# Patient Record
Sex: Female | Born: 1950
Health system: Southern US, Community
[De-identification: ages and names within clinical notes are randomized; demographics above are authoritative.]

## PROBLEM LIST (undated history)

## (undated) DIAGNOSIS — T451X5A Adverse effect of antineoplastic and immunosuppressive drugs, initial encounter: Secondary | ICD-10-CM

## (undated) DIAGNOSIS — Z9289 Personal history of other medical treatment: Secondary | ICD-10-CM

## (undated) DIAGNOSIS — N390 Urinary tract infection, site not specified: Secondary | ICD-10-CM

## (undated) DIAGNOSIS — M5136 Other intervertebral disc degeneration, lumbar region: Secondary | ICD-10-CM

## (undated) DIAGNOSIS — K219 Gastro-esophageal reflux disease without esophagitis: Secondary | ICD-10-CM

## (undated) DIAGNOSIS — K649 Unspecified hemorrhoids: Secondary | ICD-10-CM

## (undated) DIAGNOSIS — I10 Essential (primary) hypertension: Secondary | ICD-10-CM

## (undated) DIAGNOSIS — N183 Chronic kidney disease, stage 3 unspecified: Secondary | ICD-10-CM

## (undated) DIAGNOSIS — G4730 Sleep apnea, unspecified: Secondary | ICD-10-CM

## (undated) DIAGNOSIS — E875 Hyperkalemia: Secondary | ICD-10-CM

## (undated) DIAGNOSIS — N200 Calculus of kidney: Secondary | ICD-10-CM

## (undated) DIAGNOSIS — S92909A Unspecified fracture of unspecified foot, initial encounter for closed fracture: Secondary | ICD-10-CM

## (undated) DIAGNOSIS — Z872 Personal history of diseases of the skin and subcutaneous tissue: Secondary | ICD-10-CM

## (undated) DIAGNOSIS — M51369 Other intervertebral disc degeneration, lumbar region without mention of lumbar back pain or lower extremity pain: Secondary | ICD-10-CM

## (undated) DIAGNOSIS — N762 Acute vulvitis: Secondary | ICD-10-CM

## (undated) DIAGNOSIS — G4733 Obstructive sleep apnea (adult) (pediatric): Secondary | ICD-10-CM

## (undated) DIAGNOSIS — D509 Iron deficiency anemia, unspecified: Secondary | ICD-10-CM

## (undated) DIAGNOSIS — M1990 Unspecified osteoarthritis, unspecified site: Secondary | ICD-10-CM

## (undated) DIAGNOSIS — J4540 Moderate persistent asthma, uncomplicated: Secondary | ICD-10-CM

## (undated) DIAGNOSIS — Z9889 Other specified postprocedural states: Secondary | ICD-10-CM

## (undated) DIAGNOSIS — J45991 Cough variant asthma: Secondary | ICD-10-CM

## (undated) DIAGNOSIS — E669 Obesity, unspecified: Secondary | ICD-10-CM

## (undated) DIAGNOSIS — N2889 Other specified disorders of kidney and ureter: Secondary | ICD-10-CM

## (undated) DIAGNOSIS — T7840XA Allergy, unspecified, initial encounter: Secondary | ICD-10-CM

## (undated) DIAGNOSIS — I509 Heart failure, unspecified: Secondary | ICD-10-CM

## (undated) DIAGNOSIS — J4550 Severe persistent asthma, uncomplicated: Secondary | ICD-10-CM

## (undated) DIAGNOSIS — J309 Allergic rhinitis, unspecified: Secondary | ICD-10-CM

## (undated) DIAGNOSIS — N8502 Endometrial intraepithelial neoplasia [EIN]: Secondary | ICD-10-CM

## (undated) DIAGNOSIS — B029 Zoster without complications: Secondary | ICD-10-CM

## (undated) DIAGNOSIS — D61810 Antineoplastic chemotherapy induced pancytopenia: Secondary | ICD-10-CM

## (undated) DIAGNOSIS — E039 Hypothyroidism, unspecified: Secondary | ICD-10-CM

## (undated) DIAGNOSIS — C541 Malignant neoplasm of endometrium: Secondary | ICD-10-CM

## (undated) DIAGNOSIS — Z9989 Dependence on other enabling machines and devices: Secondary | ICD-10-CM

## (undated) DIAGNOSIS — M5432 Sciatica, left side: Secondary | ICD-10-CM

## (undated) DIAGNOSIS — D649 Anemia, unspecified: Secondary | ICD-10-CM

## (undated) DIAGNOSIS — Z30430 Encounter for insertion of intrauterine contraceptive device: Secondary | ICD-10-CM

## (undated) DIAGNOSIS — E118 Type 2 diabetes mellitus with unspecified complications: Secondary | ICD-10-CM

## (undated) DIAGNOSIS — E785 Hyperlipidemia, unspecified: Secondary | ICD-10-CM

## (undated) DIAGNOSIS — G620 Drug-induced polyneuropathy: Secondary | ICD-10-CM

## (undated) DIAGNOSIS — R011 Cardiac murmur, unspecified: Secondary | ICD-10-CM

## (undated) DIAGNOSIS — N763 Subacute and chronic vulvitis: Secondary | ICD-10-CM

## (undated) DIAGNOSIS — Z86010 Personal history of colonic polyps: Secondary | ICD-10-CM

## (undated) DIAGNOSIS — Z8489 Family history of other specified conditions: Secondary | ICD-10-CM

## (undated) HISTORY — DX: Moderate persistent asthma, uncomplicated: J45.40

## (undated) HISTORY — DX: Encounter for insertion of intrauterine contraceptive device: Z30.430

## (undated) HISTORY — DX: Severe persistent asthma, uncomplicated: J45.50

## (undated) HISTORY — PX: FRACTURE SURGERY: SHX138

## (undated) HISTORY — DX: Iron deficiency anemia, unspecified: D50.9

## (undated) HISTORY — DX: Unspecified osteoarthritis, unspecified site: M19.90

## (undated) HISTORY — DX: Hyperkalemia: E87.5

## (undated) HISTORY — PX: CARPAL TUNNEL RELEASE: SHX101

## (undated) HISTORY — DX: Hypothyroidism, unspecified: E03.9

## (undated) HISTORY — DX: Essential (primary) hypertension: I10

## (undated) HISTORY — DX: Other intervertebral disc degeneration, lumbar region without mention of lumbar back pain or lower extremity pain: M51.369

## (undated) HISTORY — DX: Subacute and chronic vulvitis: N76.3

## (undated) HISTORY — PX: OTHER SURGICAL HISTORY: SHX169

## (undated) HISTORY — DX: Anemia, unspecified: D64.9

## (undated) HISTORY — DX: Sciatica, left side: M54.32

## (undated) HISTORY — DX: Personal history of diseases of the skin and subcutaneous tissue: Z87.2

## (undated) HISTORY — DX: Endometrial intraepithelial neoplasia (EIN): N85.02

## (undated) HISTORY — DX: Dependence on other enabling machines and devices: Z99.89

## (undated) HISTORY — DX: Unspecified hemorrhoids: K64.9

## (undated) HISTORY — PX: COLONOSCOPY: SHX174

## (undated) HISTORY — DX: Calculus of kidney: N20.0

## (undated) HISTORY — DX: Antineoplastic chemotherapy induced pancytopenia: D61.810

## (undated) HISTORY — DX: Other specified disorders of kidney and ureter: N28.89

## (undated) HISTORY — PX: ABDOMINAL HYSTERECTOMY: SHX81

## (undated) HISTORY — DX: Allergic rhinitis, unspecified: J30.9

## (undated) HISTORY — DX: Heart failure, unspecified: I50.9

## (undated) HISTORY — DX: Allergy, unspecified, initial encounter: T78.40XA

## (undated) HISTORY — PX: EYE SURGERY: SHX253

## (undated) HISTORY — DX: Adverse effect of antineoplastic and immunosuppressive drugs, initial encounter: T45.1X5A

## (undated) HISTORY — DX: Hyperlipidemia, unspecified: E78.5

## (undated) HISTORY — DX: Urinary tract infection, site not specified: N39.0

## (undated) HISTORY — DX: Gastro-esophageal reflux disease without esophagitis: K21.9

## (undated) HISTORY — PX: JOINT REPLACEMENT: SHX530

## (undated) HISTORY — DX: Personal history of other medical treatment: Z92.89

## (undated) HISTORY — DX: Cardiac murmur, unspecified: R01.1

## (undated) HISTORY — DX: Sleep apnea, unspecified: G47.30

## (undated) HISTORY — DX: Type 2 diabetes mellitus with unspecified complications: E11.8

## (undated) HISTORY — DX: Obesity, unspecified: E66.9

## (undated) HISTORY — DX: Obstructive sleep apnea (adult) (pediatric): G47.33

## (undated) HISTORY — DX: Zoster without complications: B02.9

## (undated) HISTORY — PX: ESOPHAGOGASTRODUODENOSCOPY: SHX1529

## (undated) HISTORY — DX: Drug-induced polyneuropathy: G62.0

## (undated) HISTORY — DX: Other intervertebral disc degeneration, lumbar region: M51.36

## (undated) HISTORY — DX: Personal history of colonic polyps: Z86.010

## (undated) HISTORY — DX: Chronic kidney disease, stage 3 unspecified: N18.30

## (undated) HISTORY — DX: Cough variant asthma: J45.991

## (undated) HISTORY — DX: Acute vulvitis: N76.2

---

## 1982-04-11 HISTORY — PX: ANKLE FRACTURE SURGERY: SHX122

## 1983-04-12 HISTORY — PX: TUBAL LIGATION: SHX77

## 1986-04-11 HISTORY — PX: CHOLECYSTECTOMY OPEN: SUR202

## 1998-04-11 HISTORY — PX: ARTHROSCOPIC REPAIR ACL: SUR80

## 2005-01-11 ENCOUNTER — Encounter: Admission: RE | Admit: 2005-01-11 | Discharge: 2005-01-11 | Payer: Self-pay | Admitting: Family Medicine

## 2005-07-10 DIAGNOSIS — N200 Calculus of kidney: Secondary | ICD-10-CM

## 2005-07-10 HISTORY — DX: Calculus of kidney: N20.0

## 2005-07-20 ENCOUNTER — Other Ambulatory Visit: Admission: RE | Admit: 2005-07-20 | Discharge: 2005-07-20 | Payer: Self-pay | Admitting: Obstetrics & Gynecology

## 2005-08-09 HISTORY — PX: CARDIAC CATHETERIZATION: SHX172

## 2005-08-25 ENCOUNTER — Ambulatory Visit (HOSPITAL_COMMUNITY): Admission: RE | Admit: 2005-08-25 | Discharge: 2005-08-25 | Payer: Self-pay | Admitting: *Deleted

## 2005-10-13 ENCOUNTER — Encounter: Admission: RE | Admit: 2005-10-13 | Discharge: 2005-10-13 | Payer: Self-pay | Admitting: Obstetrics & Gynecology

## 2006-07-12 ENCOUNTER — Encounter: Admission: RE | Admit: 2006-07-12 | Discharge: 2006-07-12 | Payer: Self-pay | Admitting: Orthopedic Surgery

## 2006-08-08 ENCOUNTER — Encounter: Admission: RE | Admit: 2006-08-08 | Discharge: 2006-08-08 | Payer: Self-pay | Admitting: Family Medicine

## 2006-08-10 HISTORY — PX: ARTHROSCOPIC REPAIR ACL: SUR80

## 2006-08-30 ENCOUNTER — Ambulatory Visit (HOSPITAL_BASED_OUTPATIENT_CLINIC_OR_DEPARTMENT_OTHER): Admission: RE | Admit: 2006-08-30 | Discharge: 2006-08-30 | Payer: Self-pay | Admitting: Orthopedic Surgery

## 2007-08-10 HISTORY — PX: OTHER SURGICAL HISTORY: SHX169

## 2007-08-15 ENCOUNTER — Other Ambulatory Visit: Admission: RE | Admit: 2007-08-15 | Discharge: 2007-08-15 | Payer: Self-pay | Admitting: Obstetrics & Gynecology

## 2007-08-30 ENCOUNTER — Encounter: Admission: RE | Admit: 2007-08-30 | Discharge: 2007-08-30 | Payer: Self-pay | Admitting: Obstetrics & Gynecology

## 2008-01-02 ENCOUNTER — Inpatient Hospital Stay (HOSPITAL_COMMUNITY): Admission: RE | Admit: 2008-01-02 | Discharge: 2008-01-05 | Payer: Self-pay | Admitting: Orthopedic Surgery

## 2008-02-23 ENCOUNTER — Emergency Department (HOSPITAL_BASED_OUTPATIENT_CLINIC_OR_DEPARTMENT_OTHER): Admission: EM | Admit: 2008-02-23 | Discharge: 2008-02-23 | Payer: Self-pay | Admitting: Emergency Medicine

## 2008-12-09 ENCOUNTER — Ambulatory Visit (HOSPITAL_COMMUNITY): Admission: RE | Admit: 2008-12-09 | Discharge: 2008-12-09 | Payer: Self-pay | Admitting: Orthopedic Surgery

## 2009-03-11 ENCOUNTER — Ambulatory Visit: Payer: Self-pay | Admitting: Vascular Surgery

## 2009-03-11 ENCOUNTER — Encounter (INDEPENDENT_AMBULATORY_CARE_PROVIDER_SITE_OTHER): Payer: Self-pay | Admitting: Orthopedic Surgery

## 2009-03-11 ENCOUNTER — Ambulatory Visit (HOSPITAL_COMMUNITY): Admission: RE | Admit: 2009-03-11 | Discharge: 2009-03-11 | Payer: Self-pay | Admitting: Orthopedic Surgery

## 2009-05-04 ENCOUNTER — Encounter (INDEPENDENT_AMBULATORY_CARE_PROVIDER_SITE_OTHER): Payer: Self-pay | Admitting: Orthopedic Surgery

## 2009-05-04 ENCOUNTER — Inpatient Hospital Stay (HOSPITAL_COMMUNITY): Admission: RE | Admit: 2009-05-04 | Discharge: 2009-05-07 | Payer: Self-pay | Admitting: Orthopedic Surgery

## 2009-06-11 ENCOUNTER — Encounter: Admission: RE | Admit: 2009-06-11 | Discharge: 2009-08-04 | Payer: Self-pay | Admitting: Orthopedic Surgery

## 2009-11-16 ENCOUNTER — Inpatient Hospital Stay (HOSPITAL_COMMUNITY): Admission: RE | Admit: 2009-11-16 | Discharge: 2009-11-18 | Payer: Self-pay | Admitting: Orthopedic Surgery

## 2009-12-22 ENCOUNTER — Encounter
Admission: RE | Admit: 2009-12-22 | Discharge: 2010-01-21 | Payer: Self-pay | Source: Home / Self Care | Admitting: Orthopedic Surgery

## 2010-03-11 HISTORY — PX: DILATION AND CURETTAGE OF UTERUS: SHX78

## 2010-03-30 ENCOUNTER — Ambulatory Visit
Admission: RE | Admit: 2010-03-30 | Discharge: 2010-03-30 | Payer: Self-pay | Source: Home / Self Care | Attending: Obstetrics & Gynecology | Admitting: Obstetrics & Gynecology

## 2010-05-12 HISTORY — PX: COMBINED HYSTEROSCOPY DIAGNOSTIC / D&C: SUR297

## 2010-06-08 ENCOUNTER — Ambulatory Visit (HOSPITAL_BASED_OUTPATIENT_CLINIC_OR_DEPARTMENT_OTHER)
Admission: RE | Admit: 2010-06-08 | Discharge: 2010-06-08 | Disposition: A | Discharge status: Home / Self Care | Payer: BC Managed Care – PPO | Source: Ambulatory Visit | Attending: Obstetrics & Gynecology | Admitting: Obstetrics & Gynecology

## 2010-06-08 ENCOUNTER — Other Ambulatory Visit: Payer: Self-pay | Admitting: Obstetrics & Gynecology

## 2010-06-08 DIAGNOSIS — N840 Polyp of corpus uteri: Secondary | ICD-10-CM | POA: Insufficient documentation

## 2010-06-08 DIAGNOSIS — G4733 Obstructive sleep apnea (adult) (pediatric): Secondary | ICD-10-CM | POA: Insufficient documentation

## 2010-06-08 DIAGNOSIS — N8501 Benign endometrial hyperplasia: Secondary | ICD-10-CM | POA: Insufficient documentation

## 2010-06-08 DIAGNOSIS — E109 Type 1 diabetes mellitus without complications: Secondary | ICD-10-CM | POA: Insufficient documentation

## 2010-06-08 DIAGNOSIS — N200 Calculus of kidney: Secondary | ICD-10-CM | POA: Insufficient documentation

## 2010-06-08 DIAGNOSIS — N950 Postmenopausal bleeding: Secondary | ICD-10-CM | POA: Insufficient documentation

## 2010-06-08 DIAGNOSIS — K219 Gastro-esophageal reflux disease without esophagitis: Secondary | ICD-10-CM | POA: Insufficient documentation

## 2010-06-08 LAB — POCT I-STAT 4, (NA,K, GLUC, HGB,HCT)
Glucose, Bld: 172 mg/dL — ABNORMAL HIGH (ref 70–99)
Potassium: 4.2 mEq/L (ref 3.5–5.1)
Sodium: 141 mEq/L (ref 135–145)

## 2010-06-08 LAB — GLUCOSE, CAPILLARY: Glucose-Capillary: 167 mg/dL — ABNORMAL HIGH (ref 70–99)

## 2010-06-20 NOTE — Op Note (Signed)
[name], [name]                ACCOUNT NO.:  [id number]  MEDICAL RECORD NO.:  [id number]          PATIENT TYPE:  AMB  LOCATION:  [facility]                         FACILITY:  [facility]  PHYSICIAN:  M. Leda Quail, MD  DATE OF BIRTH:  May 27, 1950  DATE OF PROCEDURE: DATE OF DISCHARGE:  03/30/2010                              OPERATIVE REPORT   PREOPERATIVE DIAGNOSES: 1. This is a 60 year old G2 P2 married white female with     postmenopausal bleeding. 2. The March 11, 2010, office endometrial biopsy showed complex     hyperplasia without atypia. 3. Dilatation and curettage on March 31, 2011, showed degenerating     secretory type endometrium with a significant amount of white blood     cells consistent with pus. 4. Morbid obesity. 5. Obstructive sleep apnea. 6. Diabetes. 7. Reflux disease. 8. Nephrolithiasis. 9. History of Zoon's vulvitis.  POSTOPERATIVE DIAGNOSES: 1. This is a 61 year old G2 P2 married white female with    postmenopausal bleeding. 2. The March 11, 2010, office endometrial biopsy showed complex     hyperplasia without atypia. 3. Dilatation and curettage on March 31, 2011, showed degenerating     secretory type endometrium with a significant amount of white blood     cells consistent with pus. 4. Morbid obesity. 5. Obstructive sleep apnea. 6. Diabetes. 7. Reflux disease. 8. Nephrolithiasis. 9. History of Zoon's vulvitis.  PROCEDURE:  Hysteroscopy Dilatation and curettage.  SURGEON:  M. Leda Quail, MD  ASSISTANT:  OR staff.  ANESTHESIA:  MAC.  ANESTHESIOLOGIST:  Jenelle Mages. Fortune, M.D. who oversaw the case.  FINDINGS:  Thin endometrium to the lower part of the uterus with some fluffy endometrium up at the cornu of the uterus bilaterally.  SPECIMENS:  Endometrial curettings, sent to pathology.  ESTIMATED BLOOD LOSS:  Minimal.  FLUIDS:  900 mL of LR.  URINE OUTPUT:  The 100 mL of clear urine drained with catheterization  at beginning of the procedure.  Hysteroscopic fluid deficit 0 mL, 1.5% glycine was used.  COMPLICATIONS:  None.  INDICATIONS:  [name] is a very nice 60 year old G2 P2 married white female who has a history of postmenopausal bleeding that was initially evaluated with an endometrial biopsy in the office.  It showed complex hyperplasia without atypia.  A D and C was then performed about 3 weeks later which showed secretory endometrium but no evidence of hyperplasia and significant amount of white cells.  She had a low-grade fever that started the day after surgery, so I treated her with antibiotics like an endometritis.  This resolved very quickly, and she never had an elevated white count.  Her fever resolved within 24 hours of starting the antibiotics.  She was seen back in the office, and due to the findings on the D and C specimen, I performed a repeat ultrasound in the office which showed thin endometrium in the central and lower portion of the uterus and endometrial thickness of no greater than 2.8 mm and some thickness at the right and left horns of the uterus.  She does have an arcuate pattern.  Because of this, I recommend  that we proceed again with hysteroscopy and D and C and then any further treatment will be pending these results.  Risks and benefits have been explained to the patient.  She has consented and is here for the procedure today.  DESCRIPTION OF PROCEDURE:  The patient taken to operating room.  She is placed in supine position.  Anesthesia was administered by the anesthesia staff without difficulty.  Legs positioned in the low lithotomy position in the Seneca stirrups.  Perineum, inner thighs, and vagina were prepped and draped in normal sterile fashion.  A red rubber Foley catheter was used to drain the bladder of all urine.  The patient was placed in some slight Trendelenburg to lift her pannus a little bit off her mons pubis and then the nursing assistant  helped to lift this during the case.  A Graves speculum was placed in the vagina.  The anterior lip of the cervix was grasped with single-tooth tenaculum.  Ten cc of 1% lidocaine was used for paracervical block, this was placed at the 3 o'clock, 6 o'clock, 9 o'clock, and 12 o'clock positions on the cervix.  The uterus then sounded to about 9.5 cm.  The uterus was dilated to a 21 Pratt dilator.  The 2.9-mm diagnostic hysteroscope was obtained.  Lower portion of the uterus and the endometrium appeared fairly thin at the very fundal region bilaterally and then in the cornua there was little bit of fluffy tissue that was present.  The hysteroscope was removed, and a #1 rough curette was used to curette the cornua and the cavity until rough gritty texture was noted in all quadrants.  There was significantly less tissue and bleeding compared to the last procedure.  Once there was no tissue really being brought out during the D and C, the procedure was ended.  The tenaculum was removed from the cervix and there was small amount of bleeding from the tenaculum site which was made hemostatic using silver nitrate.  At this point, the procedure was completely ended.  Betadine prep was cleansed off the skin.  All instruments then removed from the vagina. Sponge, lap, needle, and instrument counts were correct x2.  The patient tolerated the procedure well.  She was awoken from anesthesia and taken to the recovery room in stable condition.     Lum Keas, MD     MSM/MEDQ  D:  06/08/2010  T:  06/08/2010  Job:  [id number]  Electronically Signed by Paul Half MD on 06/20/2010 02:57:57 PM

## 2010-06-21 LAB — POCT I-STAT, CHEM 8
BUN: 21 mg/dL (ref 6–23)
Calcium, Ion: 1.02 mmol/L — ABNORMAL LOW (ref 1.12–1.32)
Chloride: 108 mEq/L (ref 96–112)
Creatinine, Ser: 1.1 mg/dL (ref 0.4–1.2)
Glucose, Bld: 97 mg/dL (ref 70–99)
HCT: 37 % (ref 36.0–46.0)
Hemoglobin: 12.6 g/dL (ref 12.0–15.0)
Potassium: 3.9 mEq/L (ref 3.5–5.1)
Sodium: 142 mEq/L (ref 135–145)
TCO2: 22 mmol/L (ref 0–100)

## 2010-06-21 LAB — GLUCOSE, CAPILLARY: Glucose-Capillary: 124 mg/dL — ABNORMAL HIGH (ref 70–99)

## 2010-06-25 LAB — CROSSMATCH: ABO/RH(D): O POS

## 2010-06-25 LAB — CBC
HCT: 25.4 % — ABNORMAL LOW (ref 36.0–46.0)
HCT: 36.4 % (ref 36.0–46.0)
Hemoglobin: 12.3 g/dL (ref 12.0–15.0)
Hemoglobin: 8.2 g/dL — ABNORMAL LOW (ref 12.0–15.0)
MCH: 31.2 pg (ref 26.0–34.0)
MCH: 31.7 pg (ref 26.0–34.0)
MCHC: 32.3 g/dL (ref 30.0–36.0)
MCHC: 32.9 g/dL (ref 30.0–36.0)
MCHC: 33.8 g/dL (ref 30.0–36.0)
MCV: 93.8 fL (ref 78.0–100.0)
MCV: 94.8 fL (ref 78.0–100.0)
Platelets: 171 10*3/uL (ref 150–400)
Platelets: 175 10*3/uL (ref 150–400)
Platelets: 220 10*3/uL (ref 150–400)
RBC: 2.65 MIL/uL — ABNORMAL LOW (ref 3.87–5.11)
RBC: 3.01 MIL/uL — ABNORMAL LOW (ref 3.87–5.11)
RBC: 3.88 MIL/uL (ref 3.87–5.11)
RDW: 13.6 % (ref 11.5–15.5)
RDW: 13.8 % (ref 11.5–15.5)
WBC: 6.9 10*3/uL (ref 4.0–10.5)
WBC: 7.9 10*3/uL (ref 4.0–10.5)

## 2010-06-25 LAB — BASIC METABOLIC PANEL
CO2: 31 mEq/L (ref 19–32)
Calcium: 8.2 mg/dL — ABNORMAL LOW (ref 8.4–10.5)
Chloride: 103 mEq/L (ref 96–112)
Creatinine, Ser: 0.7 mg/dL (ref 0.4–1.2)
GFR calc Af Amer: >60 mL/min (ref >60)
GFR calc Af Amer: >60 mL/min (ref >60)
GFR calc non Af Amer: >60 mL/min (ref >60)
GFR calc non Af Amer: >60 mL/min (ref >60)
Glucose, Bld: 126 mg/dL — ABNORMAL HIGH (ref 70–99)
Potassium: 3.8 mEq/L (ref 3.5–5.1)
Sodium: 137 mEq/L (ref 135–145)

## 2010-06-25 LAB — COMPREHENSIVE METABOLIC PANEL
ALT: 25 U/L (ref 0–35)
AST: 32 U/L (ref 0–37)
Albumin: 3.7 g/dL (ref 3.5–5.2)
Alkaline Phosphatase: 76 U/L (ref 39–117)
BUN: 13 mg/dL (ref 6–23)
CO2: 28 mEq/L (ref 19–32)
Calcium: 9.2 mg/dL (ref 8.4–10.5)
Chloride: 105 mEq/L (ref 96–112)
Creatinine, Ser: 0.65 mg/dL (ref 0.4–1.2)
GFR calc Af Amer: >60 mL/min (ref >60)
GFR calc non Af Amer: >60 mL/min (ref >60)
Glucose, Bld: 154 mg/dL — ABNORMAL HIGH (ref 70–99)
Potassium: 4.3 mEq/L (ref 3.5–5.1)
Sodium: 139 mEq/L (ref 135–145)
Total Bilirubin: 0.4 mg/dL (ref 0.3–1.2)
Total Protein: 7.8 g/dL (ref 6.0–8.3)

## 2010-06-25 LAB — URINE CULTURE
Colony Count: >=100000
Culture  Setup Time: 201108050010

## 2010-06-25 LAB — POCT I-STAT GLUCOSE: Glucose, Bld: 223 mg/dL — ABNORMAL HIGH (ref 70–99)

## 2010-06-25 LAB — DIFFERENTIAL
Basophils Absolute: 0 10*3/uL (ref 0.0–0.1)
Basophils Relative: 0 % (ref 0–1)
Eosinophils Absolute: 0.3 10*3/uL (ref 0.0–0.7)
Eosinophils Relative: 4 % (ref 0–5)
Lymphocytes Relative: 18 % (ref 12–46)
Lymphs Abs: 1.4 10*3/uL (ref 0.7–4.0)
Monocytes Absolute: 0.6 10*3/uL (ref 0.1–1.0)
Monocytes Relative: 8 % (ref 3–12)
Neutro Abs: 5.6 10*3/uL (ref 1.7–7.7)
Neutrophils Relative %: 70 % (ref 43–77)

## 2010-06-25 LAB — URINALYSIS, ROUTINE W REFLEX MICROSCOPIC
Bilirubin Urine: NEGATIVE
Glucose, UA: NEGATIVE mg/dL
Ketones, ur: NEGATIVE mg/dL
Nitrite: POSITIVE — AB
Protein, ur: NEGATIVE mg/dL
Specific Gravity, Urine: 1.012 (ref 1.005–1.030)
Urobilinogen, UA: 0.2 mg/dL (ref 0.0–1.0)
pH: 5 (ref 5.0–8.0)

## 2010-06-25 LAB — GLUCOSE, CAPILLARY
Glucose-Capillary: 128 mg/dL — ABNORMAL HIGH (ref 70–99)
Glucose-Capillary: 174 mg/dL — ABNORMAL HIGH (ref 70–99)
Glucose-Capillary: 192 mg/dL — ABNORMAL HIGH (ref 70–99)
Glucose-Capillary: 207 mg/dL — ABNORMAL HIGH (ref 70–99)
Glucose-Capillary: 256 mg/dL — ABNORMAL HIGH (ref 70–99)
Glucose-Capillary: 260 mg/dL — ABNORMAL HIGH (ref 70–99)

## 2010-06-25 LAB — SURGICAL PCR SCREEN
MRSA, PCR: NEGATIVE
Staphylococcus aureus: POSITIVE — AB

## 2010-06-25 LAB — URINE MICROSCOPIC-ADD ON

## 2010-06-25 LAB — PROTIME-INR
INR: 0.94 (ref 0.00–1.49)
Prothrombin Time: 12.8 seconds (ref 11.6–15.2)

## 2010-06-25 LAB — APTT: aPTT: 26 seconds (ref 24–37)

## 2010-06-27 LAB — BASIC METABOLIC PANEL
BUN: 8 mg/dL (ref 6–23)
Chloride: 103 mEq/L (ref 96–112)
GFR calc Af Amer: >60 mL/min (ref >60)
GFR calc non Af Amer: >60 mL/min (ref >60)
GFR calc non Af Amer: >60 mL/min (ref >60)
Glucose, Bld: 206 mg/dL — ABNORMAL HIGH (ref 70–99)
Potassium: 3.7 mEq/L (ref 3.5–5.1)
Potassium: 3.8 mEq/L (ref 3.5–5.1)
Sodium: 136 mEq/L (ref 135–145)

## 2010-06-27 LAB — URINALYSIS, ROUTINE W REFLEX MICROSCOPIC
Bilirubin Urine: NEGATIVE
Ketones, ur: NEGATIVE mg/dL
Nitrite: NEGATIVE
Specific Gravity, Urine: 1.023 (ref 1.005–1.030)
Urobilinogen, UA: 0.2 mg/dL (ref 0.0–1.0)

## 2010-06-27 LAB — COMPREHENSIVE METABOLIC PANEL
Albumin: 3.6 g/dL (ref 3.5–5.2)
Alkaline Phosphatase: 69 U/L (ref 39–117)
BUN: 12 mg/dL (ref 6–23)
CO2: 27 mEq/L (ref 19–32)
Chloride: 103 mEq/L (ref 96–112)
Creatinine, Ser: 0.68 mg/dL (ref 0.4–1.2)
GFR calc non Af Amer: >60 mL/min (ref >60)
Glucose, Bld: 150 mg/dL — ABNORMAL HIGH (ref 70–99)
Potassium: 4.2 mEq/L (ref 3.5–5.1)
Total Bilirubin: 0.7 mg/dL (ref 0.3–1.2)

## 2010-06-27 LAB — CROSSMATCH
ABO/RH(D): O POS
Antibody Screen: NEGATIVE

## 2010-06-27 LAB — CBC
HCT: 26.9 % — ABNORMAL LOW (ref 36.0–46.0)
HCT: 27 % — ABNORMAL LOW (ref 36.0–46.0)
HCT: 31.2 % — ABNORMAL LOW (ref 36.0–46.0)
HCT: 35.3 % — ABNORMAL LOW (ref 36.0–46.0)
Hemoglobin: 10.9 g/dL — ABNORMAL LOW (ref 12.0–15.0)
Hemoglobin: 11.9 g/dL — ABNORMAL LOW (ref 12.0–15.0)
Hemoglobin: 9.2 g/dL — ABNORMAL LOW (ref 12.0–15.0)
MCHC: 32.7 g/dL (ref 30.0–36.0)
MCHC: 34.4 g/dL (ref 30.0–36.0)
MCV: 91.9 fL (ref 78.0–100.0)
MCV: 92.4 fL (ref 78.0–100.0)
MCV: 92.8 fL (ref 78.0–100.0)
Platelets: 185 10*3/uL (ref 150–400)
Platelets: 198 K/uL (ref 150–400)
Platelets: 212 10*3/uL (ref 150–400)
Platelets: 281 10*3/uL (ref 150–400)
RBC: 2.92 MIL/uL — ABNORMAL LOW (ref 3.87–5.11)
RBC: 3.82 MIL/uL — ABNORMAL LOW (ref 3.87–5.11)
RDW: 15 % (ref 11.5–15.5)
RDW: 15.6 % — ABNORMAL HIGH (ref 11.5–15.5)
WBC: 7.9 10*3/uL (ref 4.0–10.5)
WBC: 8.2 10*3/uL (ref 4.0–10.5)
WBC: 8.5 K/uL (ref 4.0–10.5)

## 2010-06-27 LAB — BASIC METABOLIC PANEL WITH GFR
BUN: 8 mg/dL (ref 6–23)
CO2: 31 meq/L (ref 19–32)
Calcium: 8.1 mg/dL — ABNORMAL LOW (ref 8.4–10.5)
Chloride: 102 meq/L (ref 96–112)
Creatinine, Ser: 0.72 mg/dL (ref 0.4–1.2)
GFR calc non Af Amer: >60 mL/min
Glucose, Bld: 189 mg/dL — ABNORMAL HIGH (ref 70–99)
Potassium: 3.7 meq/L (ref 3.5–5.1)
Sodium: 138 meq/L (ref 135–145)

## 2010-06-27 LAB — DIFFERENTIAL
Basophils Absolute: 0.1 10*3/uL (ref 0.0–0.1)
Basophils Relative: 2 % — ABNORMAL HIGH (ref 0–1)
Lymphocytes Relative: 18 % (ref 12–46)
Monocytes Absolute: 0.4 10*3/uL (ref 0.1–1.0)
Neutro Abs: 5.6 10*3/uL (ref 1.7–7.7)
Neutrophils Relative %: 70 % (ref 43–77)

## 2010-06-27 LAB — HEMOGLOBIN A1C
Hgb A1c MFr Bld: 8.6 % — ABNORMAL HIGH (ref 4.6–6.1)
Mean Plasma Glucose: 200 mg/dL

## 2010-06-27 LAB — PROTIME-INR
INR: 1.07 (ref 0.00–1.49)
INR: 1.07 (ref 0.00–1.49)
Prothrombin Time: 13.6 seconds (ref 11.6–15.2)
Prothrombin Time: 13.8 s (ref 11.6–15.2)

## 2010-06-27 LAB — GLUCOSE, CAPILLARY
Glucose-Capillary: 144 mg/dL — ABNORMAL HIGH (ref 70–99)
Glucose-Capillary: 150 mg/dL — ABNORMAL HIGH (ref 70–99)
Glucose-Capillary: 150 mg/dL — ABNORMAL HIGH (ref 70–99)
Glucose-Capillary: 166 mg/dL — ABNORMAL HIGH (ref 70–99)
Glucose-Capillary: 178 mg/dL — ABNORMAL HIGH (ref 70–99)
Glucose-Capillary: 190 mg/dL — ABNORMAL HIGH (ref 70–99)
Glucose-Capillary: 194 mg/dL — ABNORMAL HIGH (ref 70–99)
Glucose-Capillary: 204 mg/dL — ABNORMAL HIGH (ref 70–99)
Glucose-Capillary: 231 mg/dL — ABNORMAL HIGH (ref 70–99)

## 2010-06-27 LAB — APTT: aPTT: 27 seconds (ref 24–37)

## 2010-06-27 LAB — URINE CULTURE

## 2010-07-15 ENCOUNTER — Ambulatory Visit: Payer: BC Managed Care – PPO | Admitting: Gynecologic Oncology

## 2010-07-22 ENCOUNTER — Ambulatory Visit: Payer: BC Managed Care – PPO | Attending: Gynecologic Oncology | Admitting: Gynecologic Oncology

## 2010-07-22 DIAGNOSIS — E039 Hypothyroidism, unspecified: Secondary | ICD-10-CM | POA: Insufficient documentation

## 2010-07-22 DIAGNOSIS — Z79899 Other long term (current) drug therapy: Secondary | ICD-10-CM | POA: Insufficient documentation

## 2010-07-22 DIAGNOSIS — Z794 Long term (current) use of insulin: Secondary | ICD-10-CM | POA: Insufficient documentation

## 2010-07-22 DIAGNOSIS — N8501 Benign endometrial hyperplasia: Secondary | ICD-10-CM | POA: Insufficient documentation

## 2010-07-22 DIAGNOSIS — E119 Type 2 diabetes mellitus without complications: Secondary | ICD-10-CM | POA: Insufficient documentation

## 2010-07-22 DIAGNOSIS — G4730 Sleep apnea, unspecified: Secondary | ICD-10-CM | POA: Insufficient documentation

## 2010-07-22 DIAGNOSIS — E785 Hyperlipidemia, unspecified: Secondary | ICD-10-CM | POA: Insufficient documentation

## 2010-07-26 NOTE — Consult Note (Signed)
NAMEJARELYN, Peters                ACCOUNT NO.:  [id number]  MEDICAL RECORD NO.:  [id number]           PATIENT TYPE:  LOCATION:                                 FACILITY:  PHYSICIAN:  Laurette Schimke, MD     DATE OF BIRTH:  1951/01/21  DATE OF CONSULTATION:  07/22/2010 DATE OF DISCHARGE:                                CONSULTATION   REASON FOR CONSULT:  Consult was requested for management of endometrial hyperplasia.  HISTORY OF PRESENT ILLNESS:  This is a 60 year old gravida 2, para 2, last normal menstrual period in November 2006.  [name] reports vaginal spotting since July 2011; however, reported heavy vaginal bleeding.  It began in October 2012.  She was evaluated by Dr. Leda Quail and underwent an endometrial biopsy in December 2011 that demonstrated complex endometrial hyperplasia.  This was after beginning Aygestin use.  D and C was performed in December 2011 and was notable for degenerating secretory-type endometrium.  She was started on Megace and was noted to have an endometritis postoperatively, which is managed with doxycycline and endometrial ultrasound on April 07, 2010, noted endometrial thickening in the right and left horns of her arcuate uterus.  She was changed to Aygestin 5 mg twice daily and was still noted to have an endometrial thickening in the fundal horns.  An office biopsy demonstrated complex endometrial hyperplasia.  Repeat D and C with hysteroscopy on June 08, 2009, showed complex endometrial hyperplasia.  [name] denies any vaginal bleeding since the time of the first D and C.  PAST SURGICAL HISTORY:  Cesarean section in 1985, cholecystectomy in 1988, bilateral carpal tunnel syndromes in 1984 and 1988, left ankle surgery in 1984, left knee replacement in 2009, right knee replacement twice in 2011, bilateral tubal ligation.  PAST MEDICAL HISTORY: 1. Diabetes mellitus since 1992. 2. Hyperlipidemia. 3. Hypothyroidism, diagnosed in  1996. 4. Morbid obesity.  The patient states that she gained 100 pounds     after the death of her son. 5. She also reports sleep apnea with pO2 of 62%.  PAST GYN HISTORY:  Menarche occurred at age of 7 with irregular menses. No history of abnormal Pap test.  Recent mammogram, colonoscopy at age 41 within normal limits.  MEDICATIONS: 1. Humalog insulin twice daily. 2. Lantus 60 mg twice daily. 3. Aspirin 81 mg daily. 4. Nephro-protectant Norvasc 5 mg daily. 5. Vasotec 10 mg daily. 6. Lipitor 40 mg daily. 7. Aygestin 10 mg daily. 8. Ditropan 15 mg daily. 9. Synthroid 125 mcg daily.  SOCIAL HISTORY:  The patient is married.  She is a former Therapist, music. One of her children died at the age of 5 as a result of complications from pulmonary atresia and this associated with 100 pound weight gain.  REVIEW OF SYSTEMS:  The patient denies any cough, shortness of breath. She reports better ability to ambulate.  She reports 30 pound weight loss since September.  No vaginal bleeding since D and C.  No rectal bleeding or hematuria.  Mild lower extremity edema.  Otherwise, 10-point review of systems is noncontributory.  ALLERGIES:  PENICILLIN that  causes hives.  PHYSICAL EXAMINATION:  GENERAL:  A well-developed female in no acute distress. VITAL SIGNS:  Weight 264 pounds, height 5 feet 1 inch, blood pressure 142/72, pulse of 88, BMI of 50. CHEST:  Clear to auscultation. HEART:  Regular rate and rhythm.  Normal S1, S2. ABDOMEN:  Soft, nontender, morbidly obese, very large pannus that extends below the mons. PELVIC:  Normal external genitalia, Bartholin, urethral, and Skene. Cervix is visible.  No blood noted within the vault.  Unable to assess any uterine characteristics.  No cervical nodularity or parametrial infiltration. RECTAL:  Limited. EXTREMITIES:  1 to 2+ edema of bilateral lower extremities.  IMPRESSION:  [name] is a 60 year old with complex endometrial hyperplasia  without atypia, currently on Aygestin.  [name] informs me that her glucoses have been more erratic with fasting blood glucoses greater than 200 since use of the Aygestin.  The options I presented to the patient were that of continued use of Aygestin with endometrial sampling in 4 months.  If endometrial sampling shows persistent complex hyperplasia, I strongly recommend placement of a Mirena IUD.  If [name] endocrinologist feels that her hyperglycemia is significantly worse, consideration could be placed to placing IUD sooner rather than later.  These options were discussed with [name] and she states that she will follow up with Dr. Leda Quail.  Otherwise, if she were to stay on the current Aygestin dose, I would not recommend endometrial biopsy any sooner than 4 months after last D and C.  Thank you very much for allowing me to participate in the care of this patient.     Laurette Schimke, MD     WB/MEDQ  D:  07/22/2010  T:  07/23/2010  Job:  [id number]  cc:   M. Leda Quail, MD Fax: [phone]  Telford Nab, R.N. [address] [place], [place] [address]  Joselyn Arrow, MD Electronically Signed by Laurette Schimke MD on 07/26/2010 11:56:23 AM

## 2010-08-05 ENCOUNTER — Ambulatory Visit (HOSPITAL_COMMUNITY)
Admission: RE | Admit: 2010-08-05 | Discharge: 2010-08-05 | Disposition: A | Discharge status: Home / Self Care | Payer: BC Managed Care – PPO | Source: Ambulatory Visit | Attending: Orthopedic Surgery | Admitting: Orthopedic Surgery

## 2010-08-05 ENCOUNTER — Other Ambulatory Visit (HOSPITAL_COMMUNITY): Payer: Self-pay | Admitting: Orthopedic Surgery

## 2010-08-05 ENCOUNTER — Encounter (HOSPITAL_COMMUNITY)
Admission: RE | Admit: 2010-08-05 | Discharge: 2010-08-05 | Disposition: A | Discharge status: Home / Self Care | Payer: BC Managed Care – PPO | Source: Ambulatory Visit | Attending: Orthopedic Surgery | Admitting: Orthopedic Surgery

## 2010-08-05 DIAGNOSIS — G4730 Sleep apnea, unspecified: Secondary | ICD-10-CM | POA: Insufficient documentation

## 2010-08-05 DIAGNOSIS — Z01812 Encounter for preprocedural laboratory examination: Secondary | ICD-10-CM | POA: Insufficient documentation

## 2010-08-05 DIAGNOSIS — I10 Essential (primary) hypertension: Secondary | ICD-10-CM | POA: Insufficient documentation

## 2010-08-05 DIAGNOSIS — Z01818 Encounter for other preprocedural examination: Secondary | ICD-10-CM | POA: Insufficient documentation

## 2010-08-05 DIAGNOSIS — M25562 Pain in left knee: Secondary | ICD-10-CM

## 2010-08-05 LAB — SURGICAL PCR SCREEN
MRSA, PCR: NEGATIVE
Staphylococcus aureus: NEGATIVE

## 2010-08-05 LAB — DIFFERENTIAL
Eosinophils Relative: 2 % (ref 0–5)
Lymphocytes Relative: 21 % (ref 12–46)
Lymphs Abs: 2.1 10*3/uL (ref 0.7–4.0)
Neutro Abs: 6.8 10*3/uL (ref 1.7–7.7)

## 2010-08-05 LAB — PROTIME-INR: Prothrombin Time: 13.6 seconds (ref 11.6–15.2)

## 2010-08-05 LAB — CBC
HCT: 37.2 % (ref 36.0–46.0)
Hemoglobin: 12.3 g/dL (ref 12.0–15.0)
MCV: 93.9 fL (ref 78.0–100.0)
RDW: 13.7 % (ref 11.5–15.5)
WBC: 9.8 10*3/uL (ref 4.0–10.5)

## 2010-08-05 LAB — COMPREHENSIVE METABOLIC PANEL
ALT: 30 U/L (ref 0–35)
AST: 27 U/L (ref 0–37)
Alkaline Phosphatase: 92 U/L (ref 39–117)
CO2: 24 mEq/L (ref 19–32)
Chloride: 104 mEq/L (ref 96–112)
GFR calc Af Amer: >60 mL/min (ref >60)
GFR calc non Af Amer: >60 mL/min (ref >60)
Glucose, Bld: 207 mg/dL — ABNORMAL HIGH (ref 70–99)
Potassium: 4.5 mEq/L (ref 3.5–5.1)
Sodium: 137 mEq/L (ref 135–145)

## 2010-08-05 LAB — URINALYSIS, ROUTINE W REFLEX MICROSCOPIC
Bilirubin Urine: NEGATIVE
Glucose, UA: 500 mg/dL — AB
Hgb urine dipstick: NEGATIVE
Specific Gravity, Urine: 1.02 (ref 1.005–1.030)
Urobilinogen, UA: 0.2 mg/dL (ref 0.0–1.0)
pH: 5.5 (ref 5.0–8.0)

## 2010-08-07 ENCOUNTER — Emergency Department (HOSPITAL_COMMUNITY)
Admission: EM | Admit: 2010-08-07 | Discharge: 2010-08-07 | Disposition: A | Discharge status: Home / Self Care | Payer: BC Managed Care – PPO | Attending: Emergency Medicine | Admitting: Emergency Medicine

## 2010-08-07 ENCOUNTER — Emergency Department (HOSPITAL_COMMUNITY): Payer: BC Managed Care – PPO

## 2010-08-07 DIAGNOSIS — E119 Type 2 diabetes mellitus without complications: Secondary | ICD-10-CM | POA: Insufficient documentation

## 2010-08-07 DIAGNOSIS — I10 Essential (primary) hypertension: Secondary | ICD-10-CM | POA: Insufficient documentation

## 2010-08-07 DIAGNOSIS — Z794 Long term (current) use of insulin: Secondary | ICD-10-CM | POA: Insufficient documentation

## 2010-08-07 DIAGNOSIS — E039 Hypothyroidism, unspecified: Secondary | ICD-10-CM | POA: Insufficient documentation

## 2010-08-07 DIAGNOSIS — Z96659 Presence of unspecified artificial knee joint: Secondary | ICD-10-CM | POA: Insufficient documentation

## 2010-08-07 DIAGNOSIS — M25569 Pain in unspecified knee: Secondary | ICD-10-CM | POA: Insufficient documentation

## 2010-08-07 DIAGNOSIS — Z7982 Long term (current) use of aspirin: Secondary | ICD-10-CM | POA: Insufficient documentation

## 2010-08-07 DIAGNOSIS — M79609 Pain in unspecified limb: Secondary | ICD-10-CM | POA: Insufficient documentation

## 2010-08-07 DIAGNOSIS — Z79899 Other long term (current) drug therapy: Secondary | ICD-10-CM | POA: Insufficient documentation

## 2010-08-07 LAB — URINE CULTURE: Culture  Setup Time: 201204261211

## 2010-08-10 DIAGNOSIS — Z872 Personal history of diseases of the skin and subcutaneous tissue: Secondary | ICD-10-CM

## 2010-08-10 HISTORY — DX: Personal history of diseases of the skin and subcutaneous tissue: Z87.2

## 2010-08-10 HISTORY — PX: REPLACEMENT TOTAL KNEE: SUR1224

## 2010-08-12 LAB — TYPE AND SCREEN
Unit division: 0
Unit division: 0

## 2010-08-16 DIAGNOSIS — T84039A Mechanical loosening of unspecified internal prosthetic joint, initial encounter: Principal | ICD-10-CM | POA: Diagnosis present

## 2010-08-16 DIAGNOSIS — G4733 Obstructive sleep apnea (adult) (pediatric): Secondary | ICD-10-CM | POA: Diagnosis present

## 2010-08-16 DIAGNOSIS — Y831 Surgical operation with implant of artificial internal device as the cause of abnormal reaction of the patient, or of later complication, without mention of misadventure at the time of the procedure: Secondary | ICD-10-CM | POA: Diagnosis present

## 2010-08-16 DIAGNOSIS — Z96659 Presence of unspecified artificial knee joint: Secondary | ICD-10-CM

## 2010-08-16 DIAGNOSIS — D62 Acute posthemorrhagic anemia: Secondary | ICD-10-CM | POA: Diagnosis not present

## 2010-08-16 LAB — GLUCOSE, CAPILLARY
Glucose-Capillary: 133 mg/dL — ABNORMAL HIGH (ref 70–99)
Glucose-Capillary: 184 mg/dL — ABNORMAL HIGH (ref 70–99)

## 2010-08-17 LAB — GLUCOSE, CAPILLARY: Glucose-Capillary: 156 mg/dL — ABNORMAL HIGH (ref 70–99)

## 2010-08-17 LAB — BASIC METABOLIC PANEL
BUN: 12 mg/dL (ref 6–23)
CO2: 29 mEq/L (ref 19–32)
Chloride: 102 mEq/L (ref 96–112)
Creatinine, Ser: 0.62 mg/dL (ref 0.4–1.2)
Glucose, Bld: 169 mg/dL — ABNORMAL HIGH (ref 70–99)
Potassium: 4.1 mEq/L (ref 3.5–5.1)

## 2010-08-17 LAB — CBC
HCT: 28.6 % — ABNORMAL LOW (ref 36.0–46.0)
MCH: 31.8 pg (ref 26.0–34.0)
MCV: 95.7 fL (ref 78.0–100.0)
RBC: 2.99 MIL/uL — ABNORMAL LOW (ref 3.87–5.11)
WBC: 6.4 10*3/uL (ref 4.0–10.5)

## 2010-08-17 LAB — HEMOGLOBIN A1C: Hgb A1c MFr Bld: 8.6 % — ABNORMAL HIGH (ref <5.7)

## 2010-08-18 LAB — CBC
HCT: 30.9 % — ABNORMAL LOW (ref 36.0–46.0)
MCH: 31.2 pg (ref 26.0–34.0)
MCHC: 33 g/dL (ref 30.0–36.0)
MCV: 94.5 fL (ref 78.0–100.0)
Platelets: 230 10*3/uL (ref 150–400)
RDW: 13.4 % (ref 11.5–15.5)
WBC: 7.5 10*3/uL (ref 4.0–10.5)

## 2010-08-18 LAB — BASIC METABOLIC PANEL
BUN: 10 mg/dL (ref 6–23)
Calcium: 9 mg/dL (ref 8.4–10.5)
Creatinine, Ser: 0.7 mg/dL (ref 0.4–1.2)
GFR calc non Af Amer: >60 mL/min (ref >60)
Glucose, Bld: 184 mg/dL — ABNORMAL HIGH (ref 70–99)

## 2010-08-18 LAB — GLUCOSE, CAPILLARY
Glucose-Capillary: 186 mg/dL — ABNORMAL HIGH (ref 70–99)
Glucose-Capillary: 256 mg/dL — ABNORMAL HIGH (ref 70–99)

## 2010-08-18 NOTE — Op Note (Signed)
  [name], [name]                ACCOUNT NO.:  [id number]  MEDICAL RECORD NO.:  [id number]           PATIENT TYPE:  I  LOCATION:  5031                         FACILITY:  [facility]  PHYSICIAN:  [name], M.D. DATE OF BIRTH:  03-16-1951  DATE OF PROCEDURE:  08/16/2010 DATE OF DISCHARGE:                              OPERATIVE REPORT   SURGEON:  [name], MD  ASSISTANT:  Altamese Cabal, PA-C  ANESTHESIA:  General.  PREOPERATIVE DIAGNOSIS:  Failed left total knee arthroplasty.  POSTOPERATIVE DIAGNOSIS:  Failed left total knee arthroplasty.  PROCEDURE:  Left revision, total knee arthroplasty.  INDICATIONS FOR PROCEDURE:  The patient is 60 year old super-morbidly obese white female with loose total knee component.  I think secondary to her weight resulting in early failure.  Informed consent was obtained with an additional diagnosis of osteopetrosis.  DESCRIPTION OF PROCEDURE:  The patient was taken to the operating room, administered spinal anesthesia.  The left leg was prepped and draped in usual sterile fashion.  The extremity was exsanguinated with the Esmarch and tourniquet inflated to 375 mmHg and set for an hour.  Midline incision was made through the old incision.  New blade was used to make a medial parapatellar arthrotomy and performed synovectomy.  I evaluated the femoral component and it was in fact stable.  I then subluxed the patella laterally and then went into flexion.  I then easily removed the tibial component.  I removed all the cement and loose debris.  I then reamed to a size 11 and then used a four tibial tray with a 20-26 mm wedge to recreate the tibia.  I then trialed that and have to use a 23- mm polyethylene.  This afforded good stability and good patellar tracking.  I then cemented in the components with Palacos GR cement.  I removed the excess cement and allowed the cement to hard in extension. I let the tourniquet down at 2 hours and 6  minutes.  I obtained hemostasis and copiously irrigated.  I then left a Hemovac coming out superolaterally and deep to the arthrotomy and pain catheter coming out supermedially and superficial to the arthrotomy.  I closed the arthrotomy with figure-of-eight #1 Vicryl sutures, buried 0 Vicryl sutures, subcuticular 2-0 Vicryl stitches, and skin staples.  Dressed with Xeroform dressing, sponges, sterile Webril, and TED stocking.  COMPLICATIONS:  None.  DRAINS:  One Hemovac and pain catheter  ESTIMATED BLOOD LOSS:  300 mL.  TOURNIQUET TIME:  126 minutes.          ______________________________ Mila Homer Sherlean Foot, M.D.     SDL/MEDQ  D:  08/16/2010  T:  08/17/2010  Job:  [id number]  Electronically Signed by Georgena Spurling M.D. on 08/18/2010 07:38:56 AM

## 2010-08-24 NOTE — Op Note (Signed)
NAMESTAVROULA, [name]                ACCOUNT NO.:  [id number]   MEDICAL RECORD NO.:  [id number]          PATIENT TYPE:  AMB   LOCATION:  [facility]                          FACILITY:  [facility]   PHYSICIAN:  Dyke Brackett, M.D.    DATE OF BIRTH:  29-Nov-1950   DATE OF PROCEDURE:  08/30/2006  DATE OF DISCHARGE:                               OPERATIVE REPORT   PREOPERATIVE DIAGNOSES:  Medial and lateral meniscal tears, right knee;  tricompartmental osteoarthritis.   POSTOPERATIVE DIAGNOSIS:  Medial and lateral meniscal tears, right knee;  tricompartmental osteoarthritis.   OPERATION:  1. Partial medial and lateral meniscectomies.  2. Debridement chondroplasty (tricompartmental).   SURGEON:  Dyke Brackett, M.D.   ANESTHESIA:  MAC with a general.   INDICATIONS:  The patient is 38 with MRI-proven meniscal tearing thought  to be amenable to outpatient surgery.   DESCRIPTION OF PROCEDURE:  The patient was arthroscoped in the supine  position.  Arthroscopic portals were created medially and laterally.  She had mild-to-moderate grade 3 chondromalacia in the patellofemoral  joints.  The most significant lesion was a grade 4 lesion over probably  a 1 cm x 2.5-cm area abutting the meniscal tear that was debrided as  well.  She had a grade 3 lesion of the femur overlying this, which was,  again, probably of the 50% weightbearing of the femoral condyle, that  was debrided.  There was an anterior-horn of the lateral meniscus that  was debrided, and a significant grade 3, borderline grade 4 lesion along  the majority of the weightbearing surface of the lateral femoral  condyle.  The lateral tibial plateau was relatively spared.  Microfracture was carried out over the grade-4 lesion medially.  A  debridement chondroplasty, tricompartmental, partial medial and lateral  meniscectomies were carried out.  The knee was drained free of fluid.  The portals were closed with nylon, and infiltrated with Marcaine  and  morphine with 80 mg Depo-Medrol.  Taken to the recovery room in stable  condition.      Dyke Brackett, M.D.  Electronically Signed     WDC/MEDQ  D:  08/30/2006  T:  08/30/2006  Job:  [id number]

## 2010-08-24 NOTE — Op Note (Signed)
[name], [name]                ACCOUNT NO.:  [id number]   MEDICAL RECORD NO.:  [id number]          PATIENT TYPE:  INP   LOCATION:  5029                         FACILITY:  [facility]   PHYSICIAN:  Dyke Brackett, M.D.    DATE OF BIRTH:  09/21/1950   DATE OF PROCEDURE:  01/02/2008  DATE OF DISCHARGE:                               OPERATIVE REPORT   PREOPERATIVE DIAGNOSES:  1. Morbid obesity.  2. Severe osteoarthritis, right knee with lateral patellar tracking.   POSTOPERATIVE DIAGNOSES:  1. Morbid obesity.  2. Severe osteoarthritis, right knee with lateral patellar tracking.   OPERATION:  1. Right low contact stress knee replacement (cemented with medium      femur size 2, tibia and medium patella 3 pack, 12.5-mm mobile      bearing.  2. Takedown repair of patellar tendon.  3. Open lateral release.   SURGEON:  Dyke Brackett, MD   ASSISTANT:  Sharol Given, PA   TOURNIQUET TIME:  Approximately 1 hour 52 minutes.   DESCRIPTION OF PROCEDURE:  It should be noted this patient had an  extremely high BMI with her height being approximately 4 feet 10 inches,  weight approximately 250 pounds.  She also had altered anatomy with  distorted bowing of the tibia, etc., making the technical degree of  difficulty through this case due to those factors certainly more than  typical.  However, the case was accomplished without any complications.  The patient was approached with a straight skin incision and the medial  parapatellar approach of the knee was made.  Identification of the most  diseased varus compartment.  Initial decision was possibly to do a  longer stent tibia, but the anatomy was noted to be altered with almost  a translocation of the proximal tibia, posteriorly making probably the  insertion of any long-stem component impossible.  Therefore, this was  abandoned and attention was next directed to extramedullary guide with a  7-degree posterior slope, which was accomplished  without difficulty  about 2 mm below the most diseased medial compartment.  Despite the  varus deformity in the proximal tilt of the tibia medially, the patella  was noticed to be in valgus, but to expose that the patient relatively  speaking to a large size and the altered anatomy, it was required to do  a partial takedown of probably 50% of the patellar tendon insertion and  this was later reattached with Mitek anchors.  The tibia was cut with a  slope as mentioned followed by an anterior-posterior femoral cut.  Flexion gap measured at 12.5 mm, eventually 4-degree valgus cut was made  through the extension at 12.5 as well.  The finishing guide was placed  on the tibia despite the patient's issues related to above.  The bone  quality was reasonably good.  The final keel cuts were placed on the  prosthesis and peg holes for the femur.  Tibia was next addressed after  tibia had been cut previously proximally and keel was cut for the tibia  with a size 2, which again was not really  over-sized.  There was one  small area laterally with this, maybe, overhanging 0.5 mm, but the size  below this I thought was little undersized.  Trial was placed on the  tibia with eventually cement mantle somewhat larger than normal due to  the patient's size, but the trial was placed first followed by cutting  the patella, which again was noted to have severe amount of osteophytes  superiorly, which were removed.  The severe lateral tracking of the  patella required open lateral release as well.  This was checked after  the true release and excessive bleeding was noted from this site.  Once  the release was accomplished and the patellar trial placed with 12.5  bearing of the knee, which had about 20 degrees flexion contracture,  preoperatively was 0.  There was a good stability in all planes.  No  tendency for bearing spin-out.  Trial components were removed.  The  final components were next placed with the  exception of the bearing  cement with 1.2 g of tobramycin per batch where a total of two batches  were mixed on the back table, inserted tibia followed by femur patella.  Cement was allowed to harden with the trial bearing in place.  Trial  bearing was removed after again tracking was deemed to be excellent.  Before the final bearing was placed, tourniquet was released.  No  excessive bleeding was noted in the posterior aspect of the knee.  Prior  to release of the tourniquet, excess amounts of cement were removed from  the back of the knee as well as the edges of the prosthesis.  Again,  tracking was checked with the final component in place and again deemed  to be excellent.  Closure was effected with #1 Ethibond.  There was a  small area where the lateral release was not brought out through the  subcutaneous tissues when the patella was tracking seemed to open  relative to just the open area on the lateral side.  This was loosely  reapproximated with some Vicryl and again the knee could be brought up  in the 90 degrees flexion without any problems with patellar tracking or  bearing.  Closure was again effected with #1 Ethibond on the capsule, 0  and 2-0 Vicryl on the skin clips, Marcaine with epinephrine on the skin.  Slightly compressed with sterile dressing and knee immobilizer were  applied.  She was taken to the recovery room in stable condition.      Dyke Brackett, M.D.  Electronically Signed     WDC/MEDQ  D:  01/02/2008  T:  01/03/2008  Job:  [id number]

## 2010-08-27 NOTE — Cardiovascular Report (Signed)
NAMEBREXLEY, [name]                ACCOUNT NO.:  [id number]   MEDICAL RECORD NO.:  [id number]          PATIENT TYPE:  OIB   LOCATION:  2899                         FACILITY:  [facility]   PHYSICIAN:  Meade Maw, M.D.    DATE OF BIRTH:  08/07/1950   DATE OF PROCEDURE:  08/25/2005  DATE OF DISCHARGE:                              CARDIAC CATHETERIZATION   INDICATION FOR STUDY:  60 year old female with ongoing chest pressure,  palpitations, and abnormal Cardiolite.  The Cardiolite was suboptimal  secondary to the patient's body habitus.   PROCEDURE:  After obtaining written informed consent, the patient was  brought to the cardiac catheterization lab in a post absorptive state.  Preop sedation was achieved.  The patient was given Versed 2 mg IV, Zofran 4  mg IV, and fentanyl 50 mcg IV throughout the case for additional sedation.  The right groin was prepped and draped in the usual sterile fashion.  Local  anesthesia was achieved using 1% Xylocaine. A 6 French hemostasis sheath was  placed into the right femoral artery with much difficulty.  Multiple  attempts were obtained.  A Smart needle was utilized.  Dr. Eldridge Dace was  asked for assistance and after multiple attempts, a 6 [place] was placed into  the right femoral artery.  Selective coronary angiography was performed  using the JL4 and JR4 Judkins catheters.  Multiple views were obtained.  All  catheter exchanges were made over a guide wire.  Single plane ventriculogram  was performed in the RAO position using a 6 French pigtail curved catheter.  Selective coronary angiography was performed using JL4 and JR4 Judkins  catheter.  Multiple views were obtained.  All catheter exchanges were made  over the guide wire.  There was no identifiable disease.  The patient was  transferred to the holding area.  Hemostasis was achieved.  There was no  immediate complications.   FINDINGS:  The aortic pressure is 134/64, LV pressure was 134/11 with an  EDP  of 18.  Single plane ventriculogram revealed normal wall motion.  The  ejection fraction was 60-65%.  Fluoroscopy revealed no significant  calcification.   CORONARY ANGIOGRAPHY:  The left main coronary artery bifurcates into the  left anterior descending and circumflex vessel.  There is no significant  disease noted in the left main coronary artery.   Left anterior descending:  The left anterior descending gives rise to a  large D1, small D2, goes on to end as an apical branch.  There is no  significant disease in the left anterior descending or its branches.   Circumflex vessel:  The circumflex vessel is a small to moderate size vessel  giving rise to one bifurcating OM1.  There is no disease noted in the  circumflex or its branches.   Right coronary artery:  The right coronary artery is a large dominant artery  that gives rise to trivial RV marginal, a large PDA, and a large  trifurcating PLV branch.  There is no significant disease noted in the right  coronary artery.   The patient was noted to have bradycardia  with premature atrial contractions  during the case, minimal heart rate was 40.  She was also noted to have O2  desaturations with sedation.   IMPRESSION:  1.  Normal coronary angiography, normal single-plane ventriculogram.      Abnormal Cardiolite secondary to the      patient's body habitus.  2.  Palpitations and tachy arrhythmias.  Will review her Holter monitor for      further evaluation.  3.  Probable sleep apnea.  The patient has been scheduled for a sleep study.      Meade Maw, M.D.  Electronically Signed     HP/MEDQ  D:  08/25/2005  T:  08/25/2005  Job:  [id number]   cc:   Lavonda Jumbo, M.D.  Fax: [phone]

## 2010-08-27 NOTE — Discharge Summary (Signed)
NAMERUMAISA, SCHNETZER                ACCOUNT NO.:  [id number]   MEDICAL RECORD NO.:  [id number]          PATIENT TYPE:  INP   LOCATION:  5029                         FACILITY:  [facility]   PHYSICIAN:  Dyke Brackett, M.D.    DATE OF BIRTH:  1950/11/03   DATE OF ADMISSION:  01/02/2008  DATE OF DISCHARGE:  01/05/2008                               DISCHARGE SUMMARY   ADMITTING DIAGNOSIS:  Right knee osteoarthritis.   DISCHARGE AND FINAL DIAGNOSIS:  Right knee osteoarthritis.   SURGEON:  Dyke Brackett, MD   PHYSICIAN ASSISTANT:  Joslyn Devon. Katrinka Blazing, [place]   PROCEDURE DONE:  A right total knee replacement for right knee  osteoarthritis on January 02, 2008.   HISTORY OF PRESENT ILLNESS:  Ms. Steidle came in, was admitted to the  hospital at [facility] on January 02, 2008, came over to the  preoperative area for a right total knee replacement due to ongoing  osteoarthritis for years, had successful right knee replacement  performed by Dr. Madelon Lips with Kateri Plummer PA assistant.  She was  transferred to PACU in stable condition and then transferred up to the  floor, in 5000.   HOSPITAL COURSE:  Postoperative day #1 was January 03, 2008.  The  patient was doing well at that point besides some mild nausea, which  does seem to be from anesthesia, PCA pump.  There was a consult put in  for Internal Medicine due to the patient's diabetes and the patient was  doing well with a consult from that point from [facility].  Her  hemoglobin on postoperative day #1 was 10.1 and hematocrit was 29.4.  Vitals were within normal limits and she was afebrile.  Her right lower  extremity was neurovascularly intact.  Foley catheter was discharged  with 50% weightbearing, knee mobilizer, up with Physical Therapy.  Postoperative day #2 was on January 04, 2008, the patient was doing  well, nausea improved.  Hemoglobin was 9.6 and hematocrit was 28.3.  Drain was pulled from her wound on her right knee, had no  signs of DVT.  Negative Homans sign.  Calf was nontender.  Working on CPM as much as 0-  90.  Vitals signs are stable.  She was afebrile.  Right lower extremity  was neurovascularly intact.  Internal Medicine continued to follow her  diabetes, well controlled with insulin by Internal Medicine.  She will  continue 50% weightbearing on that right lower extremity.  Postoperative  day #3 was on January 05, 2008, hemoglobin was 9.2 and hematocrit was  26.8.  Vitals signs are stable and she was afebrile.  Blood sugars are  at 196, controlled by Internal Medicine.  Exam was benign.  No noted  abnormality in studies.  CPM 0-90 degrees.  She was cleared medically to  go home, 50% weightbearing on that right lower extremity, set up with  home health, registered nurse, CPM, warfarin, walker, 3-in-one  __________.   FINAL DIAGNOSIS:  Right knee osteoarthritis, status post right total  knee replacement.   FINAL ASSESSMENT AND PLAN:  Ms. Thackeray is a 60 year old  female who was  discharged with right total knee replacement on January 05, 2008.  Her  weightbearing status was 50% weightbearing, knee immobilizer on that  right lower extremity.  Her diet was given as a diabetic diet on  discharge.  Wound Care, to keep the dressing over the wound, may shower,  and change dressing afterwards.  She was in stable condition at  discharge.  Discharged home on Lovenox 40 mg subcu per day at 8 a.m. for  a total of another 11 days.  She will follow up in our office for staple  removal, 10-14 days status post procedure.   FINAL DISCHARGE MEDICATIONS:  1. Percocet 5/325 one-two tablets p.o. q.4-6 h. p.r.n. pain.  2. Lovenox 40 mg subcu per day at 8 a.m. for a total of 14 days      postoperatively.  3. Robaxin 500 mg one tablet p.o. q.6-8 h. p.r.n. pain.  4. Humalog 10 units with meals.  5. Lantus 35 units in the morning.  6. Lantus 75 units nightly.  7. Synthroid 0.125 mg nightly.  8. Aspirin, that was  stopped but she was taking 81 mg per day.  9. Zocor 80 mg nightly, continued.  10.Enalapril 10 mg daily, continued.  11.Amlodipine 5 mg daily, continued.  12.Nexium 20 mg daily, continued.  13.Estrace cream nightly, that was continued.  14.FiberChoice 2 over-the-counter, that was continued.  15.Clobetasol 0.05% topical ointment, which was continued.  16.Calcium 600 mg plus vitamin D was continued.  17.Metformin 500 mg twice daily was continued.      Sharol Given, PA      Dyke Brackett, M.D.  Electronically Signed    JBS/MEDQ  D:  01/09/2008  T:  01/10/2008  Job:  [id number]

## 2010-09-09 ENCOUNTER — Ambulatory Visit: Payer: BC Managed Care – PPO | Attending: Orthopedic Surgery | Admitting: Physical Therapy

## 2010-09-09 DIAGNOSIS — M25569 Pain in unspecified knee: Secondary | ICD-10-CM | POA: Insufficient documentation

## 2010-09-09 DIAGNOSIS — Z96659 Presence of unspecified artificial knee joint: Secondary | ICD-10-CM | POA: Insufficient documentation

## 2010-09-09 DIAGNOSIS — IMO0001 Reserved for inherently not codable concepts without codable children: Secondary | ICD-10-CM | POA: Insufficient documentation

## 2010-09-14 ENCOUNTER — Ambulatory Visit: Payer: BC Managed Care – PPO | Attending: Orthopedic Surgery | Admitting: Rehabilitation

## 2010-09-14 DIAGNOSIS — Z96659 Presence of unspecified artificial knee joint: Secondary | ICD-10-CM | POA: Insufficient documentation

## 2010-09-14 DIAGNOSIS — M25569 Pain in unspecified knee: Secondary | ICD-10-CM | POA: Insufficient documentation

## 2010-09-14 DIAGNOSIS — IMO0001 Reserved for inherently not codable concepts without codable children: Secondary | ICD-10-CM | POA: Insufficient documentation

## 2010-09-16 ENCOUNTER — Ambulatory Visit: Payer: BC Managed Care – PPO | Admitting: Physical Therapy

## 2010-09-21 ENCOUNTER — Ambulatory Visit: Payer: BC Managed Care – PPO | Admitting: Rehabilitation

## 2010-09-23 ENCOUNTER — Ambulatory Visit: Payer: BC Managed Care – PPO | Admitting: Rehabilitation

## 2010-09-28 ENCOUNTER — Ambulatory Visit: Payer: BC Managed Care – PPO | Admitting: Rehabilitation

## 2010-09-29 ENCOUNTER — Ambulatory Visit: Payer: BC Managed Care – PPO | Admitting: Rehabilitation

## 2010-10-05 ENCOUNTER — Ambulatory Visit: Payer: BC Managed Care – PPO | Admitting: Rehabilitation

## 2010-10-11 ENCOUNTER — Ambulatory Visit: Payer: BC Managed Care – PPO | Admitting: Rehabilitation

## 2010-10-14 ENCOUNTER — Ambulatory Visit: Payer: BC Managed Care – PPO | Attending: Orthopedic Surgery | Admitting: Rehabilitation

## 2010-10-14 DIAGNOSIS — IMO0001 Reserved for inherently not codable concepts without codable children: Secondary | ICD-10-CM | POA: Insufficient documentation

## 2010-10-14 DIAGNOSIS — Z96659 Presence of unspecified artificial knee joint: Secondary | ICD-10-CM | POA: Insufficient documentation

## 2010-10-14 DIAGNOSIS — M25569 Pain in unspecified knee: Secondary | ICD-10-CM | POA: Insufficient documentation

## 2010-10-18 ENCOUNTER — Ambulatory Visit: Payer: BC Managed Care – PPO | Admitting: Rehabilitation

## 2010-10-20 ENCOUNTER — Ambulatory Visit: Payer: BC Managed Care – PPO | Admitting: Rehabilitation

## 2010-10-25 ENCOUNTER — Ambulatory Visit: Payer: BC Managed Care – PPO | Admitting: Physical Therapy

## 2010-11-01 ENCOUNTER — Ambulatory Visit: Payer: BC Managed Care – PPO | Admitting: Rehabilitation

## 2010-11-05 NOTE — Op Note (Signed)
  NAMEPETREA, [name]                ACCOUNT NO.:  [id number]  MEDICAL RECORD NO.:  [id number]  LOCATION:  [facility]                         FACILITY:  [facility]  PHYSICIAN:  Chucky May, M.D.  DATE OF BIRTH:  [birth year]  DATE OF PROCEDURE: DATE OF DISCHARGE:                              OPERATIVE REPORT   PREOPERATIVE DIAGNOSIS:  Cataract, left eye.  POSTOPERATIVE DIAGNOSIS:  Cataract, left eye.  INDICATIONS FOR SURGERY:  The patient is a 60 year old female with painless progressive decrease in vision such that she has difficulty seeing for reading.  She previously had cataract surgery in the right eye and because of her high myopia, the subsequent anisometropia caused problems with depth perception.  OPERATION PERFORMED:  Cataract extraction with intraocular lens implant, left eye.  SURGEON:  Chucky May, M.D.  ANESTHESIA:  MAC.  PROCEDURE:  The patient was brought to the main operating room and placed in the supine position.  Anesthesia was obtained by means of topical 4% lidocaine drops with tetracaine.  She was then prepped and draped in the usual manner.  A lid speculum was inserted and the cornea was entered by a 2.4 mm keratome temporally with an additional port inferiorly with a 1-mm keratome.  Viscoelastic was instilled into the anterior chamber and anterior capsulorrhexis was performed without difficulty in a curvilinear fashion.  The nucleus was mobilized by hydrodissection followed by phacoemulsification of the nucleus and removal of residual cortical material by irrigation and aspiration.  The posterior capsule was polished and the posterior chamber lens implant model SN60WF, power 11.0, serial number [id number] was placed in the bag without difficulty.  Viscoelastic was removed by irrigation and aspiration and replaced with Balanced Salt Solution.  The wounds were hydrated with Balanced Salt Solution and checked for fluid leaks and none were noted.  The eye  was then dressed with topical prednisolone acetate and Vigamox drops as well as a Fox shield and the patient was taken to the recovery room where she received written and verbal instructions for postoperative care and was scheduled for followup in 24 hours.          ______________________________ Chucky May, M.D.     DJD/MEDQ  D:  09/15/2010  T:  09/15/2010  Job:  [id number]  Electronically Signed by Nelson Chimes M.D. on 11/05/2010 09:31:44 AM

## 2010-11-08 ENCOUNTER — Ambulatory Visit: Payer: BC Managed Care – PPO | Admitting: Physical Therapy

## 2011-01-10 LAB — URINE MICROSCOPIC-ADD ON

## 2011-01-10 LAB — URINALYSIS, ROUTINE W REFLEX MICROSCOPIC
Bilirubin Urine: NEGATIVE
Ketones, ur: NEGATIVE
Nitrite: NEGATIVE
Nitrite: NEGATIVE
Protein, ur: NEGATIVE
Specific Gravity, Urine: 1.02
Urobilinogen, UA: 0.2
Urobilinogen, UA: 0.2
pH: 5

## 2011-01-10 LAB — BASIC METABOLIC PANEL
BUN: 11
BUN: 14
BUN: 8
CO2: 27
Calcium: 8.8
Chloride: 99
Creatinine, Ser: 0.84
GFR calc Af Amer: >60
GFR calc non Af Amer: >60
GFR calc non Af Amer: >60
Glucose, Bld: 197 — ABNORMAL HIGH
Potassium: 4.1
Potassium: 4.3
Potassium: 4.5
Sodium: 135

## 2011-01-10 LAB — URINE CULTURE
Culture: NO GROWTH
Special Requests: NEGATIVE

## 2011-01-10 LAB — CBC
HCT: 26.8 — ABNORMAL LOW
HCT: 28.3 — ABNORMAL LOW
HCT: 29.4 — ABNORMAL LOW
MCHC: 33.3
MCV: 92.4
Platelets: 205
Platelets: 248
Platelets: 284
RBC: 2.91 — ABNORMAL LOW
RBC: 3.2 — ABNORMAL LOW
RDW: 13.7
RDW: 14.3
WBC: 8.2
WBC: 9.1

## 2011-01-10 LAB — COMPREHENSIVE METABOLIC PANEL
ALT: 27
Albumin: 3.6
Alkaline Phosphatase: 63
Calcium: 9.6
Glucose, Bld: 120 — ABNORMAL HIGH
Potassium: 4.5
Sodium: 139
Total Protein: 6.9

## 2011-01-10 LAB — GLUCOSE, CAPILLARY
Glucose-Capillary: 176 — ABNORMAL HIGH
Glucose-Capillary: 202 — ABNORMAL HIGH
Glucose-Capillary: 220 — ABNORMAL HIGH
Glucose-Capillary: 230 — ABNORMAL HIGH
Glucose-Capillary: 242 — ABNORMAL HIGH
Glucose-Capillary: 244 — ABNORMAL HIGH

## 2011-01-10 LAB — DIFFERENTIAL
Basophils Relative: 0
Eosinophils Absolute: 0.2
Lymphs Abs: 1.5
Monocytes Absolute: 0.6
Monocytes Relative: 8
Neutro Abs: 5.1
Neutrophils Relative %: 69

## 2011-01-10 LAB — ABO/RH: ABO/RH(D): O POS

## 2011-01-10 LAB — TYPE AND SCREEN
ABO/RH(D): O POS
Antibody Screen: NEGATIVE

## 2011-01-10 LAB — PROTIME-INR: INR: 0.9

## 2011-01-10 LAB — APTT: aPTT: 31

## 2011-04-25 ENCOUNTER — Ambulatory Visit: Payer: BC Managed Care – PPO | Attending: Pain Medicine | Admitting: Physical Therapy

## 2011-04-25 DIAGNOSIS — M25519 Pain in unspecified shoulder: Secondary | ICD-10-CM | POA: Insufficient documentation

## 2011-04-25 DIAGNOSIS — M25619 Stiffness of unspecified shoulder, not elsewhere classified: Secondary | ICD-10-CM | POA: Insufficient documentation

## 2011-04-25 DIAGNOSIS — IMO0001 Reserved for inherently not codable concepts without codable children: Secondary | ICD-10-CM | POA: Insufficient documentation

## 2011-04-28 ENCOUNTER — Ambulatory Visit: Payer: BC Managed Care – PPO | Admitting: Rehabilitation

## 2011-05-02 ENCOUNTER — Ambulatory Visit: Payer: BC Managed Care – PPO | Admitting: Rehabilitation

## 2011-05-05 ENCOUNTER — Ambulatory Visit: Payer: BC Managed Care – PPO | Admitting: Rehabilitation

## 2011-05-09 ENCOUNTER — Ambulatory Visit: Payer: BC Managed Care – PPO | Admitting: Physical Therapy

## 2011-05-12 ENCOUNTER — Ambulatory Visit: Payer: BC Managed Care – PPO | Admitting: Rehabilitation

## 2011-05-16 ENCOUNTER — Ambulatory Visit: Payer: BC Managed Care – PPO | Attending: Pain Medicine | Admitting: Physical Therapy

## 2011-05-16 DIAGNOSIS — M25619 Stiffness of unspecified shoulder, not elsewhere classified: Secondary | ICD-10-CM | POA: Insufficient documentation

## 2011-05-16 DIAGNOSIS — IMO0001 Reserved for inherently not codable concepts without codable children: Secondary | ICD-10-CM | POA: Insufficient documentation

## 2011-05-16 DIAGNOSIS — M25519 Pain in unspecified shoulder: Secondary | ICD-10-CM | POA: Insufficient documentation

## 2011-05-19 ENCOUNTER — Ambulatory Visit: Payer: BC Managed Care – PPO | Admitting: Rehabilitation

## 2011-05-23 ENCOUNTER — Ambulatory Visit: Payer: BC Managed Care – PPO | Admitting: Physical Therapy

## 2011-05-26 ENCOUNTER — Ambulatory Visit: Payer: BC Managed Care – PPO | Admitting: Rehabilitation

## 2011-05-30 ENCOUNTER — Ambulatory Visit: Payer: BC Managed Care – PPO | Admitting: Rehabilitation

## 2011-06-02 ENCOUNTER — Ambulatory Visit: Payer: BC Managed Care – PPO | Admitting: Rehabilitation

## 2011-06-06 ENCOUNTER — Ambulatory Visit: Payer: BC Managed Care – PPO | Admitting: Rehabilitation

## 2011-06-13 ENCOUNTER — Ambulatory Visit: Payer: BC Managed Care – PPO | Attending: Pain Medicine | Admitting: Physical Therapy

## 2011-06-13 DIAGNOSIS — IMO0001 Reserved for inherently not codable concepts without codable children: Secondary | ICD-10-CM | POA: Insufficient documentation

## 2011-06-13 DIAGNOSIS — M25619 Stiffness of unspecified shoulder, not elsewhere classified: Secondary | ICD-10-CM | POA: Insufficient documentation

## 2011-06-13 DIAGNOSIS — M25519 Pain in unspecified shoulder: Secondary | ICD-10-CM | POA: Insufficient documentation

## 2011-06-16 ENCOUNTER — Encounter: Payer: BC Managed Care – PPO | Admitting: Rehabilitation

## 2011-08-31 ENCOUNTER — Other Ambulatory Visit: Payer: Self-pay | Admitting: Obstetrics & Gynecology

## 2011-08-31 DIAGNOSIS — Z1231 Encounter for screening mammogram for malignant neoplasm of breast: Secondary | ICD-10-CM

## 2011-09-19 ENCOUNTER — Ambulatory Visit
Admission: RE | Admit: 2011-09-19 | Discharge: 2011-09-19 | Disposition: A | Discharge status: Home / Self Care | Payer: BC Managed Care – PPO | Source: Ambulatory Visit | Attending: Obstetrics & Gynecology | Admitting: Obstetrics & Gynecology

## 2011-09-19 DIAGNOSIS — Z1231 Encounter for screening mammogram for malignant neoplasm of breast: Secondary | ICD-10-CM

## 2011-09-23 ENCOUNTER — Other Ambulatory Visit: Payer: Self-pay | Admitting: Obstetrics & Gynecology

## 2011-09-23 DIAGNOSIS — R928 Other abnormal and inconclusive findings on diagnostic imaging of breast: Secondary | ICD-10-CM

## 2011-09-29 ENCOUNTER — Ambulatory Visit
Admission: RE | Admit: 2011-09-29 | Discharge: 2011-09-29 | Disposition: A | Discharge status: Home / Self Care | Payer: BC Managed Care – PPO | Source: Ambulatory Visit | Attending: Obstetrics & Gynecology | Admitting: Obstetrics & Gynecology

## 2011-09-29 ENCOUNTER — Other Ambulatory Visit: Payer: Self-pay | Admitting: Obstetrics & Gynecology

## 2011-09-29 DIAGNOSIS — R928 Other abnormal and inconclusive findings on diagnostic imaging of breast: Secondary | ICD-10-CM

## 2011-10-06 ENCOUNTER — Ambulatory Visit
Admission: RE | Admit: 2011-10-06 | Discharge: 2011-10-06 | Disposition: A | Discharge status: Home / Self Care | Payer: BC Managed Care – PPO | Source: Ambulatory Visit | Attending: Obstetrics & Gynecology | Admitting: Obstetrics & Gynecology

## 2011-10-06 ENCOUNTER — Other Ambulatory Visit: Payer: Self-pay | Admitting: Obstetrics & Gynecology

## 2011-10-06 DIAGNOSIS — R928 Other abnormal and inconclusive findings on diagnostic imaging of breast: Secondary | ICD-10-CM

## 2011-10-06 DIAGNOSIS — N6001 Solitary cyst of right breast: Secondary | ICD-10-CM

## 2012-04-11 HISTORY — PX: BREAST BIOPSY: SHX20

## 2012-10-02 ENCOUNTER — Encounter: Payer: Self-pay | Admitting: Obstetrics & Gynecology

## 2012-10-02 DIAGNOSIS — E669 Obesity, unspecified: Secondary | ICD-10-CM | POA: Insufficient documentation

## 2012-10-03 ENCOUNTER — Telehealth: Payer: Self-pay | Admitting: Obstetrics & Gynecology

## 2012-10-03 NOTE — Telephone Encounter (Signed)
Pt cancelled aex appt with dr Hyacinth Meeker and wanted to reschedule. Pt mentioned that  She gets a biopsy with the aex. Appt was just scheduled for aex.

## 2012-10-04 ENCOUNTER — Ambulatory Visit: Payer: Self-pay | Admitting: Obstetrics & Gynecology

## 2012-10-30 ENCOUNTER — Ambulatory Visit (INDEPENDENT_AMBULATORY_CARE_PROVIDER_SITE_OTHER): Payer: BC Managed Care – PPO | Admitting: Obstetrics & Gynecology

## 2012-10-30 ENCOUNTER — Other Ambulatory Visit: Payer: Self-pay

## 2012-10-30 ENCOUNTER — Encounter: Payer: Self-pay | Admitting: Obstetrics & Gynecology

## 2012-10-30 VITALS — BP 140/68 | HR 64 | Resp 16 | Ht 61.75 in | Wt 260.0 lb

## 2012-10-30 DIAGNOSIS — Z124 Encounter for screening for malignant neoplasm of cervix: Secondary | ICD-10-CM

## 2012-10-30 DIAGNOSIS — Z01419 Encounter for gynecological examination (general) (routine) without abnormal findings: Secondary | ICD-10-CM

## 2012-10-30 DIAGNOSIS — Z1231 Encounter for screening mammogram for malignant neoplasm of breast: Secondary | ICD-10-CM

## 2012-10-30 DIAGNOSIS — Z1211 Encounter for screening for malignant neoplasm of colon: Secondary | ICD-10-CM

## 2012-10-30 DIAGNOSIS — N8501 Benign endometrial hyperplasia: Secondary | ICD-10-CM

## 2012-10-30 MED ORDER — TERCONAZOLE 0.4 % VA CREA: 1.0000 | TOPICAL_CREAM | Freq: Every day | VAGINAL | Status: DC

## 2012-10-30 MED ORDER — CLOBETASOL PROPIONATE 0.05 % EX OINT: TOPICAL_OINTMENT | Freq: Two times a day (BID) | CUTANEOUS | Status: DC

## 2012-10-30 NOTE — Patient Instructions (Addendum)

## 2012-10-30 NOTE — Progress Notes (Signed)
62 y.o. [id number] MarriedCaucasianF here for annual exam.  No bleeding.  Doing well.  Seeing Dr. Talmage Nap.  Last visit was in May.  Being seen every three to four months.  Hb A1C was 8.0.  Reports was as high as 13 in the last year.  Goal is between 6-7.    Patient's last menstrual period was 08/10/2010.          Sexually active: no  The current method of family planning is IUD.    Exercising: yes  walking Smoker:  no  Health Maintenance: Pap:  08/30/11 WNL/negative HR HPV History of abnormal Pap:  No/complex hyperplasia atypia on endometrial biopsy MMG:  09/19/11 [facility], 09/29/11 additional views, 10/06/11 biopsy-MM[facility] scheduled 11/15/12 Colonoscopy:  Right at 10 years ago-would like referral BMD:   2012, normal TDaP:  5/08 Screening Labs: PCP, Hb today: PCP, Urine today: PCP   reports that she has never smoked. She has never used smokeless tobacco. She reports that she does not drink alcohol or use illicit drugs.  Past Medical History  Diagnosis Date  . Nephrolithiasis 4/07  . GERD (gastroesophageal reflux disease)   . Diabetes mellitus   . Obesity   . Sleep apnea     On CPAP  . Hemorrhoids   . Complex endometrial hyperplasia with atypia 12/12  . Cellulitis 5/12    Left (Since replacemnt of Left knee  . Encounter for insertion of mirena IUD 5/12    Past Surgical History  Procedure Laterality Date  . Ankle fracture surgery  1984    Pin & repair  . Carpal tunnel release Right 1984;1989    1989 Left  . Tubal ligation  1985    C-Section  . Cholecystectomy  1988  . Arthroscopic repair acl  2000    Due to ACL tear  . Cardiac catheterization  5/07    clear vessel mild mitral   . Arthroscopic repair acl  5/08  . Dilation and curettage of uterus  12/11  . Combined hysteroscopy diagnostic / d&c  2/12  . Replacement total knee Left 5/12    Current Outpatient Prescriptions  Medication Sig Dispense Refill  . aspirin 81 MG tablet Take 81 mg by mouth daily.      Marland Kitchen atorvastatin (LIPITOR) 40  MG tablet Take 40 mg by mouth daily.      . clobetasol ointment (TEMOVATE) 0.05 %       . enalapril (VASOTEC) 10 MG tablet Take 10 mg by mouth daily.      . insulin lispro (HUMALOG) 100 unit/mL SOLN Inject into the skin 3 (three) times daily.      Marland Kitchen levothyroxine (SYNTHROID, LEVOTHROID) 125 MCG tablet Take 125 mcg by mouth daily before breakfast.      . NOVOLOG MIX 70/30 FLEXPEN (70-30) 100 UNIT/ML SUPN       . TURMERIC PO Take by mouth.      . oxybutynin (DITROPAN XL) 15 MG 24 hr tablet Take 15 mg by mouth daily.       No current facility-administered medications for this visit.    Family History  Problem Relation Age of Onset  . Other Son     VSD/pulmonary atresia  . Diabetes Father   . Diabetes Brother     ROS:  Pertinent items are noted in HPI.  Otherwise, a comprehensive ROS was negative.  Exam:   BP 140/68  Pulse 64  Resp 16  Wt 260 lb (117.935 kg)  LMP 08/10/2010  Ht Readings from Last 3 Encounters:  No data found for Ht    General appearance: alert, cooperative and appears stated age Head: Normocephalic, without obvious abnormality, atraumatic Neck: no adenopathy, supple, symmetrical, trachea midline and thyroid normal to inspection and palpation Lungs: clear to auscultation bilaterally Breasts: normal appearance, no masses or tenderness Heart: regular rate and rhythm Abdomen: soft, non-tender; bowel sounds normal; no masses,  no organomegaly Extremities: extremities normal, atraumatic, no cyanosis or edema Skin: Skin color, texture, turgor normal. No rashes or lesions Lymph nodes: Cervical, supraclavicular, and axillary nodes normal. No abnormal inguinal nodes palpated Neurologic: Grossly normal   Pelvic: External genitalia:  no lesions, inside labia minora erythematous with patchy thick white areas              Urethra:  normal appearing urethra with no masses, tenderness or lesions              Bartholins and Skenes: normal                 Vagina:  normal appearing vagina with normal color and discharge, no lesions              Cervix: no lesions, IUD string noted              Pap taken: yes Bimanual Exam:  Uterus:  normal size, contour, position, consistency, mobility, non-tender, exam extremely difficult due to habitus              Adnexa: normal adnexa and no mass, fullness, tenderness               Rectovaginal: Confirms               Anus:  normal sphincter tone, no lesions  Endometrial biopsy recommended.  Discussed with patient.  Verbal and written consent obtained.   Procedure:  Speculum placed.  Cervix visualized and cleansed with betadine prep.  A single toothed tenaculum was applied to the anterior lip of the cervix.  Endometrial pipelle was advanced through the cervix into the endometrial cavity without difficulty.  Pipelle passed to 9cm.  Suction applied and pipelle removed with good tissue sample obtained.  2 passes performed.  Tenculum removed.  No bleeding noted.  Patient tolerated procedure well.  A:  Well Woman with normal exam Morbid obesity Diabetes OSA Elevated lipids GERD History of complex endometrial hyperplasia without atypia, now,with Mirena IUD H/O biopsy proven lichen sclerosus/Zoon's vulvitis   P:   Mammogram yearly pap smear obtained.  Neg HR HPV one yr ago. Endometrial biopsy today.  Mirena IUD placed 5/12.  This will be third f/u biopsy.  10/12, 5/13, today.  Pt knows to call if she ever has any future bleeding. Sees Dr. Talmage Nap every four months Rx for clobetasol to pharmacy return annually or prn  An After Visit Summary was printed and given to the patient.

## 2012-11-01 LAB — IPS PAP SMEAR ONLY

## 2012-11-15 ENCOUNTER — Ambulatory Visit
Admission: RE | Admit: 2012-11-15 | Discharge: 2012-11-15 | Disposition: A | Discharge status: Home / Self Care | Payer: BC Managed Care – PPO | Source: Ambulatory Visit

## 2012-11-15 DIAGNOSIS — Z1231 Encounter for screening mammogram for malignant neoplasm of breast: Secondary | ICD-10-CM

## 2012-11-16 ENCOUNTER — Other Ambulatory Visit: Payer: Self-pay | Admitting: Obstetrics & Gynecology

## 2012-11-16 DIAGNOSIS — R928 Other abnormal and inconclusive findings on diagnostic imaging of breast: Secondary | ICD-10-CM

## 2012-12-04 ENCOUNTER — Other Ambulatory Visit: Payer: BC Managed Care – PPO

## 2012-12-13 ENCOUNTER — Other Ambulatory Visit: Payer: Self-pay | Admitting: Gastroenterology

## 2012-12-13 DIAGNOSIS — R1310 Dysphagia, unspecified: Secondary | ICD-10-CM

## 2012-12-14 ENCOUNTER — Ambulatory Visit
Admission: RE | Admit: 2012-12-14 | Discharge: 2012-12-14 | Disposition: A | Discharge status: Home / Self Care | Payer: BC Managed Care – PPO | Source: Ambulatory Visit | Attending: Obstetrics & Gynecology | Admitting: Obstetrics & Gynecology

## 2012-12-14 DIAGNOSIS — R928 Other abnormal and inconclusive findings on diagnostic imaging of breast: Secondary | ICD-10-CM

## 2012-12-18 ENCOUNTER — Other Ambulatory Visit: Payer: BC Managed Care – PPO

## 2012-12-21 ENCOUNTER — Ambulatory Visit
Admission: RE | Admit: 2012-12-21 | Discharge: 2012-12-21 | Disposition: A | Discharge status: Home / Self Care | Payer: BC Managed Care – PPO | Source: Ambulatory Visit | Attending: Gastroenterology | Admitting: Gastroenterology

## 2012-12-21 ENCOUNTER — Other Ambulatory Visit: Payer: BC Managed Care – PPO

## 2012-12-21 DIAGNOSIS — R1310 Dysphagia, unspecified: Secondary | ICD-10-CM

## 2013-02-25 ENCOUNTER — Encounter (HOSPITAL_COMMUNITY): Payer: Self-pay | Admitting: *Deleted

## 2013-02-25 ENCOUNTER — Encounter (HOSPITAL_COMMUNITY)
Admission: RE | Disposition: A | Discharge status: Home / Self Care | Payer: Self-pay | Source: Ambulatory Visit | Attending: Gastroenterology

## 2013-02-25 ENCOUNTER — Ambulatory Visit (HOSPITAL_COMMUNITY)
Admission: RE | Admit: 2013-02-25 | Discharge: 2013-02-25 | Disposition: A | Discharge status: Home / Self Care | Payer: BC Managed Care – PPO | Source: Ambulatory Visit | Attending: Gastroenterology | Admitting: Gastroenterology

## 2013-02-25 DIAGNOSIS — Z860101 Personal history of adenomatous and serrated colon polyps: Secondary | ICD-10-CM

## 2013-02-25 DIAGNOSIS — E119 Type 2 diabetes mellitus without complications: Secondary | ICD-10-CM | POA: Insufficient documentation

## 2013-02-25 DIAGNOSIS — Z1211 Encounter for screening for malignant neoplasm of colon: Secondary | ICD-10-CM | POA: Insufficient documentation

## 2013-02-25 DIAGNOSIS — G4730 Sleep apnea, unspecified: Secondary | ICD-10-CM | POA: Insufficient documentation

## 2013-02-25 DIAGNOSIS — E039 Hypothyroidism, unspecified: Secondary | ICD-10-CM | POA: Insufficient documentation

## 2013-02-25 DIAGNOSIS — I10 Essential (primary) hypertension: Secondary | ICD-10-CM | POA: Insufficient documentation

## 2013-02-25 DIAGNOSIS — Z86010 Personal history of colonic polyps: Secondary | ICD-10-CM

## 2013-02-25 DIAGNOSIS — K219 Gastro-esophageal reflux disease without esophagitis: Secondary | ICD-10-CM | POA: Insufficient documentation

## 2013-02-25 DIAGNOSIS — D126 Benign neoplasm of colon, unspecified: Secondary | ICD-10-CM | POA: Insufficient documentation

## 2013-02-25 HISTORY — DX: Personal history of colonic polyps: Z86.010

## 2013-02-25 HISTORY — PX: COLONOSCOPY: SHX5424

## 2013-02-25 HISTORY — DX: Unspecified fracture of unspecified foot, initial encounter for closed fracture: S92.909A

## 2013-02-25 HISTORY — DX: Personal history of adenomatous and serrated colon polyps: Z86.0101

## 2013-02-25 LAB — GLUCOSE, CAPILLARY: Glucose-Capillary: 129 mg/dL — ABNORMAL HIGH (ref 70–99)

## 2013-02-25 SURGERY — COLONOSCOPY
Anesthesia: Moderate Sedation

## 2013-02-25 MED ORDER — FENTANYL CITRATE 0.05 MG/ML IJ SOLN
INTRAMUSCULAR | Status: DC | PRN
  Administered 2013-02-25 (×2): 25 ug via INTRAVENOUS

## 2013-02-25 MED ORDER — FENTANYL CITRATE 0.05 MG/ML IJ SOLN
INTRAMUSCULAR | Status: AC
  Filled 2013-02-25: qty 2

## 2013-02-25 MED ORDER — MIDAZOLAM HCL 10 MG/2ML IJ SOLN
INTRAMUSCULAR | Status: DC | PRN
  Administered 2013-02-25 (×2): 2 mg via INTRAVENOUS
  Administered 2013-02-25: 1 mg via INTRAVENOUS

## 2013-02-25 MED ORDER — MIDAZOLAM HCL 10 MG/2ML IJ SOLN
INTRAMUSCULAR | Status: AC
  Filled 2013-02-25: qty 2

## 2013-02-25 MED ORDER — SODIUM CHLORIDE 0.9 % IV SOLN
INTRAVENOUS | Status: DC
  Administered 2013-02-25: 500 mL via INTRAVENOUS

## 2013-02-25 MED ORDER — DIPHENHYDRAMINE HCL 50 MG/ML IJ SOLN
INTRAMUSCULAR | Status: AC
  Filled 2013-02-25: qty 1

## 2013-02-25 NOTE — Op Note (Signed)
[facility] [address] [place], [address]   OPERATIVE PROCEDURE REPORT  PATIENT: [name], [name]  MR#: [id number] BIRTHDATE: November 13, 1950 GENDER: Female ENDOSCOPIST: Lorenza Burton, MD ASSISTANT:   Dorisann Frames, technician & Tomma Rakers, RN, BSN PROCEDURE DATE: 02/25/2013 PRE-PROCEDURE PREPARATION: The patient was prepped with 2 dulcolax tablets, one ten-ounce bottle of magnesium citrate, and a gallon of Golytely the night prior to the procedure.  The patient was fasted for 8 hours prior to the procedure.  PRE-PROCEDURE PHYSICAL: Patient has stable vital signs.  Neck is supple.  There is no JVD, thyromegaly or LAD.  Chest clear to auscultation.  S1 and S2 regular.  Abdomen soft, morbidly obese, non-distended, non-tender with NABS. PROCEDURE:     Colonoscopy with hot snare polypectomy x 1. ASA CLASS:     Class IV INDICATIONS:     1.  Colorectal cancer screening. MEDICATIONS:     Fentanyl 50 mcg  and Versed 5 mg IV .  DESCRIPTION OF PROCEDURE: After the risks, benefits, and alternatives of the procedure were thoroughly explained [including a 10% missed rate of cancer and polyps], informed consent was obtained.  Digital rectal exam was performed.  The Pentax Adult Colonscope [id number]  was introduced through the anus  and advanced to the cecum, which was identified by both the appendix and ileocecal valve , limited by No adverse events experienced.   The quality of the prep was fair. . Multiple washes were done. Small lesions could be missed. The instrument was then slowly withdrawn as the colon was fully examined.     COLON FINDINGS: A 5 mm sessile polyp was found in the cecum and was removed by a hot snare x 1-200/20.  The resection was complete and the polyp tissue was completely retrieved.  The entire colonic mucosa appeared healthy with a normal vascular pattern.  No masses, diverticula or AVMs were noted.  The appendiceal orifice and the ICV  were identified and photographed. Retroflexed views revealed no abnormalities. There was a large amount of residual stool in the colon [especially in the cecum] inspite of multiple washes. Small lesions could be missed. The patient tolerated the procedure without immediate complications.  The scope was then withdrawn from the patient and the procedure terminated.  TIME TO CECUM:   03 minutes 00 seconds WITHDRAW TIME:  06 minutes 00 seconds  IMPRESSION:     1.  5 mm sessile polyp was found at the cecum; removed by hot snare polypectomy x 1. 2.  Lot of residual stool uin the colon; small lesions could be missed  RECOMMENDATIONS:     1.  Hold aspirin, aspirin products, and anti-inflammatory medication for 2 weeks. 2.  Await pathology results. 3.  Continue surveillance. 4.  High fiber diet with liberal fluid intake. 5.  OP follow-up is advised on a PRN basis.   REPEAT EXAM:      For a repeat colonoscopy in  5-10 years with a 5 day prep.  If the patient has any abnormal GI symptoms in the interim, she have been advised to contact the office as soon as possible for further recommendations.   CPT CODES:     [id number], Colonoscopy with Polypectomy (Snare)  DIAGNOSIS CODES:     V76.51 Colorectal cancer screening, 211.3 Colon polyp   REFERRED OV:FIEPPIR Hyacinth Meeker, M.D.  Zenon Mayo, M.D.  eSigned:  Dr. Lorenza Burton, MD 02/25/2013 3:54 PM   PATIENT NAME:  Mike, Berntsen MR#: [id number]

## 2013-02-25 NOTE — H&P (Signed)
[name] is an 62 y.o. female.   Chief Complaint: Colorectal cancer screening. HPI: Patient is here for a colonoscopy. She denies a history of melena or hematochezia.   Past Medical History  Diagnosis Date  . Nephrolithiasis 4/07  . GERD (gastroesophageal reflux disease)   . Diabetes mellitus   . Morbid obesity   . Sleep apnea     On CPAP  . Hemorrhoids   . Complex endometrial hyperplasia with atypia 12/12  . Cellulitis 5/12    Left (Since replacemnt of Left knee  . Encounter for insertion of mirena IUD 5/12  . Fracture, foot     left foot stress fracture recently out of boot  Hypertension Hyperlipidemia Arthritis Hypothyroidism  Past Surgical History  Procedure Laterality Date  . Ankle fracture surgery  1984    Pin & repair  . Carpal tunnel release Right 1984;1989    1989 Left  . Tubal ligation  1985    C-Section  . Cholecystectomy  1988  . Arthroscopic repair acl  2000    Due to ACL tear  . Cardiac catheterization  5/07    clear vessel mild mitral   . Arthroscopic repair acl  5/08  . Dilation and curettage of uterus  12/11  . Combined hysteroscopy diagnostic / d&c  2/12  . Replacement total knee Left 5/12  . Blateral knee rerlacements x4 2 times each knee     Family History  Problem Relation Age of Onset  . Other Son     VSD/pulmonary atresia  . Diabetes Father   . Diabetes Brother    Social History:  reports that she has never smoked. She has never used smokeless tobacco. She reports that she does not drink alcohol or use illicit drugs.  Allergies:  Allergies  Allergen Reactions  . Adhesive [Tape]   . Banana     Nausea and diarrhea  . Eggs Or Egg-Derived Products     Nausea and diarrhea  . Latex     sensativity  . Penicillins Rash   Medications Prior to Admission  Medication Sig Dispense Refill  . atorvastatin (LIPITOR) 40 MG tablet Take 40 mg by mouth daily.      . clobetasol ointment (TEMOVATE) 0.05 % Apply topically 2 (two) times daily.   60 g  1  . enalapril (VASOTEC) 10 MG tablet Take 10 mg by mouth daily.      Marland Kitchen levothyroxine (SYNTHROID, LEVOTHROID) 125 MCG tablet Take 125 mcg by mouth daily before breakfast.      . NOVOLOG MIX 70/30 FLEXPEN (70-30) 100 UNIT/ML SUPN       . TURMERIC PO Take by mouth.      Marland Kitchen aspirin 81 MG tablet Take 81 mg by mouth daily.      Marland Kitchen oxybutynin (DITROPAN XL) 15 MG 24 hr tablet Take 15 mg by mouth daily.      Marland Kitchen terconazole (TERAZOL 7) 0.4 % vaginal cream Place 1 applicator vaginally at bedtime.  45 g  0   Results for orders placed during the hospital encounter of 02/25/13 (from the past 48 hour(s))  GLUCOSE, CAPILLARY     Status: Abnormal   Collection Time    02/25/13  2:57 PM      Result Value Range   Glucose-Capillary 129 (*) 70 - 99 mg/dL   No results found.  Review of Systems  Constitutional: Negative.   HENT: Negative.   Eyes: Negative.   Cardiovascular: Negative.   Gastrointestinal: Negative.  Genitourinary: Negative.   Musculoskeletal: Positive for joint pain.  Skin: Negative.   Neurological: Negative.   Endo/Heme/Allergies: Negative.   Psychiatric/Behavioral: Negative.    Blood pressure 153/69, temperature 98.1 F (36.7 C), resp. rate 13, SpO2 93.00%. Physical Exam  Constitutional: She is oriented to person, place, and time. She appears well-developed and well-nourished.  Morbidly obese  HENT:  Head: Normocephalic and atraumatic.  Eyes: Conjunctivae and EOM are normal. Pupils are equal, round, and reactive to light.  Neck: Normal range of motion. Neck supple.  Cardiovascular: Normal rate and regular rhythm.   Respiratory: Effort normal and breath sounds normal.  GI: Soft. Bowel sounds are normal. She exhibits no distension and no mass. There is no tenderness. There is no rebound and no guarding.  Musculoskeletal: Normal range of motion.  Neurological: She is alert and oriented to person, place, and time.  Skin: Skin is warm and dry.  Psychiatric: She has a normal  mood and affect. Her behavior is normal. Judgment and thought content normal.    Assessment/Plan Colorectal cancer screening: proceed with a colonoscopy at this time.  [name] 02/25/2013, 3:19 PM

## 2013-02-26 ENCOUNTER — Encounter (HOSPITAL_COMMUNITY): Payer: Self-pay | Admitting: Gastroenterology

## 2013-08-23 ENCOUNTER — Other Ambulatory Visit (HOSPITAL_BASED_OUTPATIENT_CLINIC_OR_DEPARTMENT_OTHER): Payer: Self-pay | Admitting: Podiatry

## 2013-08-23 DIAGNOSIS — L539 Erythematous condition, unspecified: Secondary | ICD-10-CM

## 2013-08-23 DIAGNOSIS — R52 Pain, unspecified: Secondary | ICD-10-CM

## 2013-08-24 ENCOUNTER — Ambulatory Visit (HOSPITAL_BASED_OUTPATIENT_CLINIC_OR_DEPARTMENT_OTHER)
Admission: RE | Admit: 2013-08-24 | Discharge: 2013-08-24 | Disposition: A | Discharge status: Home / Self Care | Payer: BC Managed Care – PPO | Source: Ambulatory Visit | Attending: Podiatry | Admitting: Podiatry

## 2013-08-24 DIAGNOSIS — R52 Pain, unspecified: Secondary | ICD-10-CM

## 2013-08-24 DIAGNOSIS — M79609 Pain in unspecified limb: Secondary | ICD-10-CM | POA: Insufficient documentation

## 2013-08-24 DIAGNOSIS — M7989 Other specified soft tissue disorders: Secondary | ICD-10-CM | POA: Insufficient documentation

## 2013-08-24 DIAGNOSIS — L539 Erythematous condition, unspecified: Secondary | ICD-10-CM

## 2013-12-03 ENCOUNTER — Other Ambulatory Visit: Payer: Self-pay | Admitting: Obstetrics & Gynecology

## 2013-12-03 NOTE — Telephone Encounter (Signed)
Last AEX: 10/30/12 Last refill:10/30/12 #60g X 1 Current AEX:NS  Please advise

## 2014-01-17 ENCOUNTER — Telehealth: Payer: Self-pay | Admitting: Obstetrics & Gynecology

## 2014-01-17 NOTE — Telephone Encounter (Signed)
Pt wants to schedule her AEX and have an endo bx done at the same time. Pt states Dr. Sabra Heck has been doing this for several years.

## 2014-01-17 NOTE — Telephone Encounter (Signed)
Pt wants to schedule her AEX and have a endo bx done at the same time. She states Dr. Sabra Heck has been doing this for several years.

## 2014-02-10 ENCOUNTER — Encounter (HOSPITAL_COMMUNITY): Payer: Self-pay | Admitting: Gastroenterology

## 2014-03-17 ENCOUNTER — Encounter: Payer: Self-pay | Admitting: Family Medicine

## 2014-03-17 ENCOUNTER — Ambulatory Visit (INDEPENDENT_AMBULATORY_CARE_PROVIDER_SITE_OTHER): Payer: BC Managed Care – PPO | Admitting: Family Medicine

## 2014-03-17 VITALS — BP 158/71 | HR 71 | Temp 98.5°F | Resp 18 | Ht 61.0 in | Wt 275.0 lb

## 2014-03-17 DIAGNOSIS — J018 Other acute sinusitis: Secondary | ICD-10-CM

## 2014-03-17 DIAGNOSIS — J9801 Acute bronchospasm: Secondary | ICD-10-CM

## 2014-03-17 DIAGNOSIS — J209 Acute bronchitis, unspecified: Secondary | ICD-10-CM

## 2014-03-17 MED ORDER — PREDNISONE 20 MG PO TABS: ORAL_TABLET | ORAL | Status: DC

## 2014-03-17 MED ORDER — AZITHROMYCIN 250 MG PO TABS: ORAL_TABLET | ORAL | Status: DC

## 2014-03-17 MED ORDER — ALBUTEROL SULFATE HFA 108 (90 BASE) MCG/ACT IN AERS: 1.0000 | INHALATION_SPRAY | Freq: Four times a day (QID) | RESPIRATORY_TRACT | Status: DC | PRN

## 2014-03-17 NOTE — Progress Notes (Signed)
OFFICE NOTE  03/17/2014  CC:  Chief Complaint  Patient presents with  . Cough    x 1 month  . URI   HPI: Patient is a 63 y.o. Caucasian female who is here for acute sick visit--new pt, will return later for official estab care visit.   About 1 mo ago, started with subjective f/c, URI/PND, cough, only marginal improvement at times but always seems to get worse again.  +Exp wheezing noted.   Robitussin DM not helping much.  A little achiness in chest with cough, lots of pain in face/sinuses, ringing/pain in ears.   Glucoses have been a bit higher last 2 d than prev weeks.  Last A1c <7%  Pertinent PMH:  Past Medical History  Diagnosis Date  . Nephrolithiasis 4/07  . GERD (gastroesophageal reflux disease)   . Diabetes mellitus   . Obesity   . Sleep apnea     On CPAP  . Hemorrhoids   . Complex endometrial hyperplasia with atypia 12/12  . Cellulitis 5/12    Left (Since replacemnt of Left knee  . Encounter for insertion of mirena IUD 5/12  . Fracture, foot     left foot stress fracture recently out of boot  EXERCISE induced bronchospasm  MEDS:  Outpatient Prescriptions Prior to Visit  Medication Sig Dispense Refill  . clobetasol ointment (TEMOVATE) 0.05 % APPLY TOPICALLY 2 (TWO) TIMES DAILY. 60 g 1  . enalapril (VASOTEC) 10 MG tablet Take 10 mg by mouth daily.    Marland Kitchen levothyroxine (SYNTHROID, LEVOTHROID) 125 MCG tablet Take 125 mcg by mouth daily before breakfast.    . TURMERIC PO Take by mouth.    Marland Kitchen atorvastatin (LIPITOR) 40 MG tablet Take 40 mg by mouth daily.    Marland Kitchen NOVOLOG MIX 70/30 FLEXPEN (70-30) 100 UNIT/ML SUPN     . oxybutynin (DITROPAN XL) 15 MG 24 hr tablet Take 15 mg by mouth daily.    Marland Kitchen terconazole (TERAZOL 7) 0.4 % vaginal cream Place 1 applicator vaginally at bedtime. (Patient not taking: Reported on 03/17/2014) 45 g 0   No facility-administered medications prior to visit.    PE: Blood pressure 158/71, pulse 71, temperature 98.5 F (36.9 C), temperature source  Temporal, resp. rate 18, height 5\' 1"  (1.549 m), weight 275 lb (124.739 kg), SpO2 94 %. VS: noted--normal. Gen: alert, NAD, NONTOXIC APPEARING. HEENT: eyes without injection, drainage, or swelling.  Ears: EACs clear, TMs with normal light reflex and landmarks.  Nose: Clear rhinorrhea, with some dried, crusty exudate adherent to mildly injected mucosa.  No purulent d/c.  R>L  paranasal sinus TTP.  No facial swelling.  Throat and mouth without focal lesion.  No pharyngial swelling, erythema, or exudate.   Neck: supple, no LAD.   LUNGS: CTA bilat, nonlabored resps.  Some post-exhalation coughing. CV: RRR, no m/r/g. EXT: no c/c/e SKIN: no rash   IMPRESSION AND PLAN:  New pt, acute illness visit.  1) Acute sinusitis/prolonged URI, with acute bronchitis and mild bronchospasm/RAD component. Z-pack. Prednisone 40mg  qd x 5d. ProAir HFA q6h prn. Therapeutic expectations and side effect profile of medication discussed today.  Patient's questions answered. Told pt to expect mild elevation of glucoses while on prednisone. Egg-free flu vaccine given to patient today.  An After Visit Summary was printed and given to the patient.  FOLLOW UP: she'll make appt at her convenience for 30 min "establish care" visit.

## 2014-03-17 NOTE — Progress Notes (Signed)
Pre visit review using our clinic review tool, if applicable. No additional management support is needed unless otherwise documented below in the visit note. 

## 2014-04-08 ENCOUNTER — Telehealth: Payer: Self-pay | Admitting: Obstetrics & Gynecology

## 2014-04-08 NOTE — Telephone Encounter (Signed)
Dr. Sabra Heck,  Can you review and advise. Patient due for annual exam with you and requests endometrial biopsy to be scheduled at the same time. Will patient need biopsy with upcoming annual exam?

## 2014-04-08 NOTE — Telephone Encounter (Signed)
I have done three follow up biopsies on her and they have all been negative.  I will not need to do another one unless she has PMP bleeding.  I do always try to do these at her AEX.  So, if there is something that comes up at her visit, I will do it then but right now I don't think I will need to repeat it.

## 2014-04-08 NOTE — Telephone Encounter (Signed)
Patient says she is due to come in for a biopsy. Patient says she always gets her aex and biopsy at the same time.

## 2014-04-09 NOTE — Telephone Encounter (Addendum)
Message left to return call to [place] at [phone].   To give message from Dr. Sabra Heck and schedule annual exam

## 2014-04-14 NOTE — Telephone Encounter (Addendum)
Call to patient. Appointment scheduled for annual exam with Dr. Sabra Heck.  Routing to provider for final review. Patient agreeable to disposition. Will close encounter

## 2014-05-05 ENCOUNTER — Ambulatory Visit (INDEPENDENT_AMBULATORY_CARE_PROVIDER_SITE_OTHER): Payer: BLUE CROSS/BLUE SHIELD | Admitting: Obstetrics & Gynecology

## 2014-05-05 ENCOUNTER — Encounter: Payer: Self-pay | Admitting: Obstetrics & Gynecology

## 2014-05-05 VITALS — BP 158/62 | HR 68 | Resp 16 | Ht 60.75 in | Wt 275.2 lb

## 2014-05-05 DIAGNOSIS — M25579 Pain in unspecified ankle and joints of unspecified foot: Secondary | ICD-10-CM

## 2014-05-05 DIAGNOSIS — Z01419 Encounter for gynecological examination (general) (routine) without abnormal findings: Secondary | ICD-10-CM

## 2014-05-05 DIAGNOSIS — M79643 Pain in unspecified hand: Secondary | ICD-10-CM

## 2014-05-05 DIAGNOSIS — Z124 Encounter for screening for malignant neoplasm of cervix: Secondary | ICD-10-CM

## 2014-05-05 LAB — RHEUMATOID FACTOR: Rhuematoid fact SerPl-aCnc: <10 IU/mL (ref <=14)

## 2014-05-05 NOTE — Progress Notes (Signed)
64 y.o. [id number] MarriedCaucasianF here for annual exam.  Doing well.  No vaginal bleeding since last visit.  Last endometrial biopsy was 7/14 and this was negative.    Last hba1c was 6.5.  Reports she got up to #290 this past year.  Worked really hard to get some of the weight loss.  Was able to do this with strict carb control.  Pt reports knees are good but now having left foot issues.  She was in a boot for several months.  Had multiple stress fractures.  Had foot MRI.  No signs of fractures and MRI showed nerve "issues".  Ended up in a boot again.    PCP:  Dr. Anitra Lauth.  Endocrinologist:  Dr. Chalmers Cater.    Patient's last menstrual period was 08/10/2010.          Sexually active: No.  The current method of family planning is IUD.    Exercising: Yes.    when she can-walking Smoker:  no  Health Maintenance: Pap:  10/30/12 WNL History of abnormal Pap:  H/o complex hyperplasia atypia on endometrial biopsy MMG:  11/15/12 MMG, 12/14/12 additional views/us-normal Colonoscopy:  11/14-repeat in 5 years Dr Collene Mares BMD:   2012-normal TDaP:  5-08 Screening Labs: PCP, Hb today: PCP, Urine today: PCP   reports that she has never smoked. She has never used smokeless tobacco. She reports that she does not drink alcohol or use illicit drugs.  Past Medical History  Diagnosis Date  . Nephrolithiasis 4/07  . GERD (gastroesophageal reflux disease)   . Diabetes mellitus   . Obesity   . Sleep apnea     On CPAP  . Hemorrhoids   . Complex endometrial hyperplasia with atypia 12/12  . Cellulitis 5/12    Left (Since replacemnt of Left knee  . Encounter for insertion of mirena IUD 5/12  . Fracture, foot 8/14    left foot stress fracture recently out of boot/cortisone injections  . Exercise-induced bronchospasm     as child/young adult    Past Surgical History  Procedure Laterality Date  . Ankle fracture surgery  1984    Pin & repair  . Carpal tunnel release Right [id number]    1989 Left  . Tubal ligation   1985    C-Section  . Cholecystectomy  1988  . Arthroscopic repair acl  2000    Due to ACL tear  . Cardiac catheterization  5/07    clear vessel mild mitral   . Arthroscopic repair acl  5/08  . Dilation and curettage of uterus  12/11  . Combined hysteroscopy diagnostic / d&c  2/12  . Replacement total knee Left 5/12    X 2 on each  . Blateral knee rerlacements x4 2 times each knee    . Colonoscopy N/A 02/25/2013    Procedure: COLONOSCOPY;  Surgeon: Juanita Craver, MD;  Location: [facility];  Service: Endoscopy;  Laterality: N/A;    Current Outpatient Prescriptions  Medication Sig Dispense Refill  . albuterol (PROAIR HFA) 108 (90 BASE) MCG/ACT inhaler Inhale 1-2 puffs into the lungs every 6 (six) hours as needed for wheezing or shortness of breath. 1 Inhaler 0  . clobetasol ointment (TEMOVATE) 0.05 % APPLY TOPICALLY 2 (TWO) TIMES DAILY. 60 g 1  . enalapril (VASOTEC) 10 MG tablet Take 10 mg by mouth daily.    . insulin aspart (NOVOLOG FLEXPEN) 100 UNIT/ML FlexPen Inject into the skin. Sliding scale    . Insulin NPH Human, Isophane, (NOVOLIN N [place]) Inject into  the skin.    Marland Kitchen levonorgestrel (MIRENA) 20 MCG/24HR IUD 1 each by Intrauterine route once.    Marland Kitchen levothyroxine (SYNTHROID, LEVOTHROID) 125 MCG tablet Take 125 mcg by mouth daily before breakfast.    . TURMERIC PO Take by mouth.     No current facility-administered medications for this visit.    Family History  Problem Relation Age of Onset  . Other Son     VSD/pulmonary atresia  . Diabetes Father   . Diabetes Brother   . Rheum arthritis Brother     ROS:  Pertinent items are noted in HPI.  Otherwise, a comprehensive ROS was negative.  Exam:   BP 158/62 mmHg  Pulse 68  Resp 16  Ht 5' 0.75" (1.543 m)  Wt 275 lb 3.2 oz (124.83 kg)  BMI 52.43 kg/m2  LMP 08/10/2010  Weight change:+15#  Height: 5' 0.75" (154.3 cm)  Ht Readings from Last 3 Encounters:  05/05/14 5' 0.75" (1.543 m)  03/17/14 5\' 1"  (1.549 m)  10/30/12 5' 1.75"  (1.568 m)    General appearance: alert, cooperative and appears stated age Head: Normocephalic, without obvious abnormality, atraumatic Neck: no adenopathy, supple, symmetrical, trachea midline and thyroid normal to inspection and palpation Lungs: clear to auscultation bilaterally Breasts: normal appearance, no masses or tenderness Heart: regular rate and rhythm Abdomen: soft, non-tender; bowel sounds normal; no masses,  no organomegaly Extremities: extremities normal, atraumatic, no cyanosis or edema Skin: Skin color, texture, turgor normal. No rashes or lesions Lymph nodes: Cervical, supraclavicular, and axillary nodes normal. No abnormal inguinal nodes palpated Neurologic: Grossly normal   Pelvic: External genitalia:  Erythematous skin with white plaques that wipe off with my glove.  No significant change.              Urethra:  normal appearing urethra with no masses, tenderness or lesions              Bartholins and Skenes: normal                 Vagina: normal appearing vagina with normal color and discharge, no lesions              Cervix: no lesions              Pap taken: Yes.   Bimanual Exam:  Uterus:  normal size, contour, position, consistency, mobility, non-tender              Adnexa: normal adnexa and no mass, fullness, tenderness               Rectovaginal: Confirms               Anus:  normal sphincter tone, no lesions  Chaperone was present for exam.  A:  Well Woman with normal exam Morbid obesity Diabetes H/O Zoon's vulvitis, biopsy prove OSA Elevated lipids GERD History of complex endometrial hyperplasia without atypia, now,with Mirena IUD.  No vaginal bleeding in > 2 years.  Negative biopsies in 10/12, 5/13, 7/14. H/O biopsy proven lichen sclerosus/Zoon's vulvitis Foot pain  P: Mammogram yearly pap smear obtained. Neg HR HPV 2013. Sees Dr. Chalmers Cater every four months Rx for clobetasol to pharmacy RF obtained today.  Pt will be called with results.    return annually or prn

## 2014-05-07 LAB — IPS PAP TEST WITH REFLEX TO HPV

## 2014-09-10 ENCOUNTER — Telehealth: Payer: Self-pay | Admitting: Family Medicine

## 2014-09-10 NOTE — Telephone Encounter (Signed)
Spoke to patient. She was seen for sick visit with the understanding that she would be est care here.  She has yet to make est care appointment.  She states she will CB to schedule.

## 2014-09-12 ENCOUNTER — Other Ambulatory Visit: Payer: Self-pay | Admitting: Family Medicine

## 2014-09-12 ENCOUNTER — Telehealth: Payer: Self-pay | Admitting: Family Medicine

## 2014-09-12 MED ORDER — CLINDAMYCIN HCL 300 MG PO CAPS: 300.0000 mg | ORAL_CAPSULE | Freq: Three times a day (TID) | ORAL | Status: DC

## 2014-09-12 NOTE — Telephone Encounter (Signed)
I have seen this patient once in the office about 6 mo ago. Husband and daughter with strep in the last 1 wk. Pt now with a few days of ST, HA, fatigue. I agreed to rx her abx for presumed strep throat. She has appt for next week to "officially" transfer her care to me. Clinda 300mg  tid x 10d sent to her pharmacy.

## 2014-09-19 ENCOUNTER — Encounter: Payer: Self-pay | Admitting: Family Medicine

## 2014-09-19 ENCOUNTER — Ambulatory Visit (INDEPENDENT_AMBULATORY_CARE_PROVIDER_SITE_OTHER): Payer: BLUE CROSS/BLUE SHIELD | Admitting: Family Medicine

## 2014-09-19 ENCOUNTER — Telehealth: Payer: Self-pay | Admitting: Family Medicine

## 2014-09-19 VITALS — BP 124/66 | HR 79 | Temp 98.0°F | Resp 16 | Wt 285.0 lb

## 2014-09-19 DIAGNOSIS — R062 Wheezing: Secondary | ICD-10-CM

## 2014-09-19 DIAGNOSIS — J45991 Cough variant asthma: Secondary | ICD-10-CM

## 2014-09-19 DIAGNOSIS — J018 Other acute sinusitis: Secondary | ICD-10-CM

## 2014-09-19 MED ORDER — FLUTICASONE PROPIONATE HFA 110 MCG/ACT IN AERO: 2.0000 | INHALATION_SPRAY | Freq: Two times a day (BID) | RESPIRATORY_TRACT | Status: DC

## 2014-09-19 MED ORDER — ALBUTEROL SULFATE HFA 108 (90 BASE) MCG/ACT IN AERS: 1.0000 | INHALATION_SPRAY | Freq: Four times a day (QID) | RESPIRATORY_TRACT | Status: DC | PRN

## 2014-09-19 MED ORDER — AZITHROMYCIN 250 MG PO TABS: ORAL_TABLET | ORAL | Status: DC

## 2014-09-19 MED ORDER — PREDNISONE 20 MG PO TABS
ORAL_TABLET | ORAL | Status: DC
Start: 2014-09-19 — End: 2014-10-10

## 2014-09-19 MED ORDER — IPRATROPIUM-ALBUTEROL 0.5-2.5 (3) MG/3ML IN SOLN
3.0000 mL | Freq: Once | RESPIRATORY_TRACT | Status: AC
  Administered 2014-09-19: 3 mL via RESPIRATORY_TRACT

## 2014-09-19 NOTE — Telephone Encounter (Signed)
Spoke to pt and she wanted to know if she could stop the first antibiotic that was sent in and if she should start the inhaler that was prescribed today. Per Dr. Anitra Lauth okay to stop first antibiotic and okay to start inhaler two day. Pt advised and voiced understanding.

## 2014-09-19 NOTE — Progress Notes (Signed)
Pre visit review using our clinic review tool, if applicable. No additional management support is needed unless otherwise documented below in the visit note. 

## 2014-09-19 NOTE — Progress Notes (Signed)
Office Note 09/19/2014  CC:  Chief Complaint  Patient presents with  . Establish Care  . Wheezing    x 3 days   HPI:  [name] is a 64 y.o. White female who is here for her "official" establish care visit. Says she began wheezing a lot about 3 days ago.  Also with nasal congestion/sinus pressure that has progressed over the last 7-10d; recent ST illness-  I rx'd clinda in for her throat last week b/c husband and daughter both had + strep.  No fevers. + DOE lately.  Has inhaler, almost out, has helped a little. Never smoker. Last time she had a wheezing illness was when I saw her 03/2014. She got better with prednisone but says her mild/intermittent cough never completely resolved.    Past Medical History  Diagnosis Date  . Nephrolithiasis 4/07  . GERD (gastroesophageal reflux disease)   . Diabetes mellitus   . Obesity   . Sleep apnea     On CPAP  . Hemorrhoids   . Complex endometrial hyperplasia with atypia 12/12    Most recent endometrial bx was 10/2012-NEG  . Cellulitis 5/12    Left (Since replacemnt of Left knee  . Encounter for insertion of mirena IUD 5/12  . Fracture, foot 8/14    left foot stress fracture recently out of boot/cortisone injections  . Exercise-induced bronchospasm     as child/young adult  . Hypothyroidism   . History of adenomatous polyp of colon 02/25/13    5 mm cecal polyp removed by Dr. Trevor Mace 5 yrs  . Hyperlipidemia     Not on statin b/c lipids "stayed down when sugars came down" per pt.  She says Dr. Chalmers Cater knows she is not on statin anymore.  . Zoon's vulvitis     Bx-proven (GYN) -lichen sclerosis  . Anemia   . Arthritis   . Hypertension   . History of blood transfusion   . UTI (lower urinary tract infection)     Past Surgical History  Procedure Laterality Date  . Ankle fracture surgery  1984    Pin & repair  . Carpal tunnel release Right [id number]    1989 Left  . Tubal ligation  1985    C-Section  . Cholecystectomy   1988  . Arthroscopic repair acl  2000    Due to ACL tear  . Cardiac catheterization  5/07    clear vessel mild mitral   . Arthroscopic repair acl  5/08  . Dilation and curettage of uterus  12/11  . Combined hysteroscopy diagnostic / d&c  2/12    Bx neg  . Replacement total knee Left 5/12    X 2 on each  . Blateral knee rerlacements x4 2 times each knee    . Colonoscopy N/A 02/25/2013    + polypectomy x 1: Procedure: COLONOSCOPY;  Surgeon: Juanita Craver, MD;  Location: [facility];  Service: Endoscopy;  Laterality: N/A;  . Breast biopsy Right 2014    Family History  Problem Relation Age of Onset  . Other Son     VSD/pulmonary atresia  . Diabetes Father   . COPD Father   . Stroke Father   . Rheum arthritis Brother   . Thyroid disease Brother   . Diabetes Brother   . Arthritis Mother   . Alzheimer's disease Mother     History   Social History  . Marital Status: Married    Spouse Name: N/A  . Number of Children: N/A  .  Years of Education: N/A   Occupational History  . Not on file.   Social History Main Topics  . Smoking status: Never Smoker   . Smokeless tobacco: Never Used  . Alcohol Use: No  . Drug Use: No  . Sexual Activity: No   Other Topics Concern  . Not on file   Social History Narrative    Outpatient Prescriptions Prior to Visit  Medication Sig Dispense Refill  . clindamycin (CLEOCIN) 300 MG capsule Take 1 capsule (300 mg total) by mouth 3 (three) times daily. 30 capsule 0  . clobetasol ointment (TEMOVATE) 0.05 % APPLY TOPICALLY 2 (TWO) TIMES DAILY. 60 g 1  . enalapril (VASOTEC) 10 MG tablet Take 10 mg by mouth daily.    . insulin aspart (NOVOLOG FLEXPEN) 100 UNIT/ML FlexPen Inject into the skin. Sliding scale: avg 8 U with each meal    . Insulin NPH Human, Isophane, (NOVOLIN N [place]) Inject 55 Units into the skin 3 (three) times daily.     Marland Kitchen levonorgestrel (MIRENA) 20 MCG/24HR IUD 1 each by Intrauterine route once.    Marland Kitchen levothyroxine (SYNTHROID,  LEVOTHROID) 125 MCG tablet Take 125 mcg by mouth daily before breakfast.    . TURMERIC PO Take by mouth.    Marland Kitchen albuterol (PROAIR HFA) 108 (90 BASE) MCG/ACT inhaler Inhale 1-2 puffs into the lungs every 6 (six) hours as needed for wheezing or shortness of breath. 1 Inhaler 0   No facility-administered medications prior to visit.    Allergies  Allergen Reactions  . Adhesive [Tape]   . Banana     Nausea and diarrhea  . Eggs Or Egg-Derived Products     Nausea and diarrhea  . Latex     sensativity  . Penicillins Rash    ROS No CP, no fever, no diaphoresis, no nausea/vomiting or diarrhea.  No abd pain.  PE; Blood pressure 124/66, pulse 79, temperature 98 F (36.7 C), temperature source Oral, resp. rate 16, weight 285 lb (129.275 kg), SpO2 93 %. Manual bp recheck 124/66. Gen: tired-appearing but in NAD.  Morbidly obese-appearing. A bit unkempt-appearing.  Lucid thought and speech. VS: noted--bp slightly up. Gen: alert, NAD, NONTOXIC APPEARING. HEENT: eyes without injection, drainage, or swelling.  Ears: EACs clear, TMs with normal light reflex and landmarks.  Nose: Clear rhinorrhea, with some dried, crusty exudate adherent to mildly injected mucosa.  No purulent d/c.  No paranasal sinus TTP.  No facial swelling.  Throat and mouth without focal lesion.  No pharyngial swelling, erythema, or exudate.   Neck: supple, no LAD.   LUNGS: CTA bilat, nonlabored resps.  Forced exp maneuver elicits no wheezing.  She occ displays a pseudowheeze. CV: RRR, no m/r/g. EXT: no c/c/e SKIN: no rash   Pertinent labs:  None today  ASSESSMENT AND PLAN:   1) Acute asthmatic bronchitis;  I think she may have cough variant asthma and she is currently having some intermittent bronchospasm. Worsening URI sx's lately, and she is finishing the last of a round of clindamycin for presumed strep throat. Will do prednisone 40mg  qd x 5d, then 20mg  qd x 5d. RF albuterol inhaler.  Start flovent 14mcg 2 puffs  bid. Will rx azithromycin x 5d to cover atypicals. Recommended robitussin DM and saline nasal spray. Recommended pneumovax vaccine today and she says she got this in 2001.  Must also keep in mind that her chronic mild cough could be ACE-I -induced.  Has lots of skin tags on right side of neck she  would like removed and she'll make appt for this in near future.  FOLLOW UP:  Return in about 6 weeks (around 10/31/2014) for f/u cough variant asthma.  may make appt at her convenience for procedure visit to removed skin tags.

## 2014-09-19 NOTE — Telephone Encounter (Signed)
Pt is asking for a call to discuss her medication as she has a couple questions. Please call her at [phone].

## 2014-09-19 NOTE — Patient Instructions (Signed)
Trial of mucinex DM or robitussin DM otc as directed on the box. May use OTC nasal saline spray or irrigation solution bid. OTC nonsedating antihistamines prn discussed.  Avoid decongestants like afrin or sudafed or phenylephrine.

## 2014-10-10 ENCOUNTER — Ambulatory Visit (INDEPENDENT_AMBULATORY_CARE_PROVIDER_SITE_OTHER): Payer: BLUE CROSS/BLUE SHIELD | Admitting: Family Medicine

## 2014-10-10 ENCOUNTER — Encounter: Payer: Self-pay | Admitting: Family Medicine

## 2014-10-10 VITALS — BP 136/73 | HR 82 | Temp 98.0°F | Resp 16 | Ht 61.0 in | Wt 287.0 lb

## 2014-10-10 DIAGNOSIS — L918 Other hypertrophic disorders of the skin: Secondary | ICD-10-CM | POA: Diagnosis not present

## 2014-10-10 NOTE — Progress Notes (Signed)
Pre visit review using our clinic review tool, if applicable. No additional management support is needed unless otherwise documented below in the visit note. 

## 2014-10-10 NOTE — Progress Notes (Signed)
OFFICE NOTE  10/10/2014  CC:  Chief Complaint  Patient presents with  . Skin Problem    Here to have skin tags removed.     HPI: Patient is a 64 y.o. Caucasian female who is here for removal of multiple skin tags around neck line that bother her from an appearance standpoint and irritate her when shirt rubs against them.  Desires removal.  Pertinent PMH:  Past medical, surgical, social, and family history reviewed and no changes are noted since last office visit.  MEDS:  Outpatient Prescriptions Prior to Visit  Medication Sig Dispense Refill  . albuterol (PROAIR HFA) 108 (90 BASE) MCG/ACT inhaler Inhale 1-2 puffs into the lungs every 6 (six) hours as needed for wheezing or shortness of breath. 1 Inhaler 1  . clobetasol ointment (TEMOVATE) 0.05 % APPLY TOPICALLY 2 (TWO) TIMES DAILY. 60 g 1  . enalapril (VASOTEC) 10 MG tablet Take 10 mg by mouth daily.    . fluticasone (FLOVENT HFA) 110 MCG/ACT inhaler Inhale 2 puffs into the lungs 2 (two) times daily. 1 Inhaler 3  . insulin aspart (NOVOLOG FLEXPEN) 100 UNIT/ML FlexPen Inject into the skin. Sliding scale: avg 8 U with each meal    . Insulin NPH Human, Isophane, (NOVOLIN N [place]) Inject 55 Units into the skin 3 (three) times daily.     Marland Kitchen levonorgestrel (MIRENA) 20 MCG/24HR IUD 1 each by Intrauterine route once.    Marland Kitchen levothyroxine (SYNTHROID, LEVOTHROID) 125 MCG tablet Take 125 mcg by mouth daily before breakfast.    . TURMERIC PO Take by mouth.    Marland Kitchen azithromycin (ZITHROMAX) 250 MG tablet 2 tabs po qd x 1d, then 1 tab po qd x 4d (Patient not taking: Reported on 10/10/2014) 6 tablet 0  . clindamycin (CLEOCIN) 300 MG capsule Take 1 capsule (300 mg total) by mouth 3 (three) times daily. (Patient not taking: Reported on 10/10/2014) 30 capsule 0  . predniSONE (DELTASONE) 20 MG tablet 2 tabs po qd x 5d, then 1 tab po qd x 5d (Patient not taking: Reported on 10/10/2014) 15 tablet 0   No facility-administered medications prior to visit.    PE: Blood  pressure 136/73, pulse 82, temperature 98 F (36.7 C), resp. rate 16, height 5\' 1"  (1.549 m), weight 287 lb (130.182 kg), SpO2 93 %. Gen: Alert, well appearing.  Patient is oriented to person, place, time, and situation. Skin: approx 2 dozen small cutaneous skin tags around nape of neck, the largest being about 1 cm in size and having a base of 4 mm. I used 1/2 cc of 1% lidocaine with epi to anesthetize this largest lesion and then excised it with curved scalpel. I removed the remaining 2 dozen or so small skin tags w/out local anesthesia with curved scalpel.  Patient tolerated procedure well, no immediate complications.  Sent the largest lesion for pathology.  IMPRESSION AND PLAN:  Multiple cutaneous skin tags around nape of neck removed today. Sent one lesion for pathology. Dressed wounds, discussed home wound care. Signs/symptoms to call or return for were reviewed and pt expressed understanding.   An After Visit Summary was printed and given to the patient.  FOLLOW UP: prn

## 2014-10-16 ENCOUNTER — Telehealth: Payer: Self-pay | Admitting: Obstetrics & Gynecology

## 2014-10-16 DIAGNOSIS — Z30432 Encounter for removal of intrauterine contraceptive device: Secondary | ICD-10-CM

## 2014-10-16 NOTE — Telephone Encounter (Signed)
Patient calling requesting to speak to the nurse to get clarification on when her IUD should be removed.

## 2014-10-16 NOTE — Telephone Encounter (Signed)
Left message to call [place] at [phone].  Per chart Mirena was inserted in 08/2010. Will need to be removed before 08/2015.

## 2014-10-16 NOTE — Telephone Encounter (Signed)
Spoke with patient. Advised IUD removal is due by 08/2015. Patient is agreeable and would like to schedule removal for February of 2017. Appointment scheduled for 06/04/2015 at 10am with Dr.Harkin. Patient is agreeable to date and time. Order will need precert.  Cc: [name]  Routing to provider for final review. Patient agreeable to disposition. Will close encounter.   Patient aware provider will review message and nurse will return call if any additional advice or change of disposition.

## 2014-10-18 ENCOUNTER — Other Ambulatory Visit: Payer: Self-pay | Admitting: Certified Nurse Midwife

## 2014-10-20 NOTE — Telephone Encounter (Signed)
Medication refill request: Temovate 0.05% Last AEX:  05/05/14 with SM Next AEX: 07/02/15 with SM Last MMG (if hormonal medication request):  Refill authorized: Please advise.

## 2014-10-31 ENCOUNTER — Encounter: Payer: Self-pay | Admitting: Family Medicine

## 2014-10-31 ENCOUNTER — Ambulatory Visit (INDEPENDENT_AMBULATORY_CARE_PROVIDER_SITE_OTHER): Payer: BLUE CROSS/BLUE SHIELD | Admitting: Family Medicine

## 2014-10-31 VITALS — BP 146/73 | HR 73 | Temp 98.0°F | Resp 16 | Ht 60.75 in | Wt 288.0 lb

## 2014-10-31 DIAGNOSIS — J45991 Cough variant asthma: Secondary | ICD-10-CM

## 2014-10-31 DIAGNOSIS — J4540 Moderate persistent asthma, uncomplicated: Secondary | ICD-10-CM | POA: Diagnosis not present

## 2014-10-31 MED ORDER — FLUTICASONE-SALMETEROL 250-50 MCG/DOSE IN AEPB: 1.0000 | INHALATION_SPRAY | Freq: Two times a day (BID) | RESPIRATORY_TRACT | Status: DC

## 2014-10-31 NOTE — Progress Notes (Signed)
Pre visit review using our clinic review tool, if applicable. No additional management support is needed unless otherwise documented below in the visit note. 

## 2014-10-31 NOTE — Progress Notes (Signed)
OFFICE NOTE  10/31/2014  CC:  Chief Complaint  Patient presents with  . Follow-up    6 week f/u. Pt is not fasting.      HPI: Patient is a 64 y.o. Caucasian female who is here for 6 wk f/u cough-variant asthma, had acute asthmatic bronchitis at last o/v and I rx'd prednisone 10d taper and started floven 110 mcg, 2 puffs bid.  Says she feels like the flovent is helping a lot. She has to use her rescue inhaler a lot less but it is still needed avg of a few days a week. The excessive heat lately is problematic for her lungs, as is rainy weather.   Pertinent PMH:  Past medical, surgical, social, and family history reviewed and no changes are noted since last office visit.  MEDS:  Outpatient Prescriptions Prior to Visit  Medication Sig Dispense Refill  . albuterol (PROAIR HFA) 108 (90 BASE) MCG/ACT inhaler Inhale 1-2 puffs into the lungs every 6 (six) hours as needed for wheezing or shortness of breath. 1 Inhaler 1  . clobetasol ointment (TEMOVATE) 0.05 % APPLY TOPICALLY 2 (TWO) TIMES DAILY. 60 g 0  . enalapril (VASOTEC) 10 MG tablet Take 10 mg by mouth daily.    . fluticasone (FLOVENT HFA) 110 MCG/ACT inhaler Inhale 2 puffs into the lungs 2 (two) times daily. 1 Inhaler 3  . insulin aspart (NOVOLOG FLEXPEN) 100 UNIT/ML FlexPen Inject into the skin. Sliding scale: avg 8 U with each meal    . Insulin NPH Human, Isophane, (NOVOLIN N [place]) Inject 55 Units into the skin 3 (three) times daily.     Marland Kitchen levonorgestrel (MIRENA) 20 MCG/24HR IUD 1 each by Intrauterine route once.    Marland Kitchen levothyroxine (SYNTHROID, LEVOTHROID) 125 MCG tablet Take 125 mcg by mouth daily before breakfast.    . TURMERIC PO Take by mouth.     No facility-administered medications prior to visit.    PE: Blood pressure 146/73, pulse 73, temperature 98 F (36.7 C), temperature source Oral, resp. rate 16, height 5' 0.75" (1.543 m), weight 288 lb (130.636 kg), SpO2 98 %. Gen: Alert, well appearing.  Patient is oriented to  person, place, time, and situation. CV: RRR, no m/r/g.   LUNGS: CTA bilat, nonlabored resps, good aeration in all lung fields.   IMPRESSION AND PLAN:  Cough variant asthma, moderate persistent.   She is improved but still not well controlled based on the frequency of albuterol rescue requirement she reports. Will change from flovent to advair 250/50, 1 puff bid--"step-up" therapy. F/u 2 mo.  An After Visit Summary was printed and given to the patient.  FOLLOW UP: 2 mo recheck asthma

## 2014-11-02 DIAGNOSIS — J45991 Cough variant asthma: Secondary | ICD-10-CM | POA: Insufficient documentation

## 2015-01-02 ENCOUNTER — Ambulatory Visit (INDEPENDENT_AMBULATORY_CARE_PROVIDER_SITE_OTHER): Payer: BLUE CROSS/BLUE SHIELD | Admitting: Family Medicine

## 2015-01-02 ENCOUNTER — Encounter: Payer: Self-pay | Admitting: Family Medicine

## 2015-01-02 VITALS — BP 141/76 | HR 84 | Temp 98.0°F | Resp 16 | Wt 291.4 lb

## 2015-01-02 DIAGNOSIS — M5432 Sciatica, left side: Secondary | ICD-10-CM

## 2015-01-02 DIAGNOSIS — J45991 Cough variant asthma: Secondary | ICD-10-CM | POA: Diagnosis not present

## 2015-01-02 MED ORDER — PREDNISONE 20 MG PO TABS: ORAL_TABLET | ORAL | Status: DC

## 2015-01-02 NOTE — Progress Notes (Signed)
OFFICE VISIT  01/02/2015   CC:  Chief Complaint  Patient presents with  . Follow-up    medications  . Acute Visit    Left Hip Pain, last couple of months hurting more.   HPI:    Patient is a 64 y.o. Caucasian female who presents for f/u cough variant asthma/mod pers asthma. Asthma doing a lot better.  Using alb rescue avg q 10d.  Has to use it 2 times on one day usually and then things resolve. No longer having any coughing fits.    Left hip hurting, says she has had "problems" for a number of years, saw ortho in about 1999, x-rays done, eventually dx'd as sciatica.  Bothered her only a little over the subsequent years but then the last couple months it is much more bothersome. It seems to hurt constantly now, points to focal area in L glut region posteriorly, occ extends up glu to back of hip level.  No rad of pain down leg but some sense of tightening down back of leg is felt on/off.  Standing/walking makes it worse, now starting to even hurt when sitting.  She takes 3 aleve per 24h lately: this takes the pain from 8/10 down to 2-3/10. I see no left hip x-rays in EMR.   Past Medical History  Diagnosis Date  . Nephrolithiasis 4/07  . GERD (gastroesophageal reflux disease)   . Diabetes mellitus   . Obesity   . OSA on CPAP   . Hemorrhoids   . Complex endometrial hyperplasia with atypia 12/12    Most recent endometrial bx was 10/2012-NEG  . Cellulitis 5/12    Left (Since replacemnt of Left knee  . Encounter for insertion of mirena IUD 5/12  . Fracture, foot 8/14    left foot stress fracture recently out of boot/cortisone injections  . Exercise-induced bronchospasm     as child/young adult  . Hypothyroidism   . History of adenomatous polyp of colon 02/25/13    5 mm cecal polyp removed by Dr. Trevor Mace 5 yrs  . Hyperlipidemia     Not on statin b/c lipids "stayed down when sugars came down" per pt.  She says Dr. Chalmers Cater knows she is not on statin anymore.  . Zoon's vulvitis     Bx-proven (GYN) -lichen sclerosis  . Anemia   . Arthritis   . Hypertension   . History of blood transfusion   . UTI (lower urinary tract infection)     Past Surgical History  Procedure Laterality Date  . Ankle fracture surgery  1984    Pin & repair  . Carpal tunnel release Right [id number]    1989 Left  . Tubal ligation  1985    C-Section  . Cholecystectomy  1988  . Arthroscopic repair acl  2000    Due to ACL tear  . Cardiac catheterization  5/07    clear vessel mild mitral   . Arthroscopic repair acl  5/08  . Dilation and curettage of uterus  12/11  . Combined hysteroscopy diagnostic / d&c  2/12    Bx neg  . Replacement total knee Left 5/12    X 2 on each  . Blateral knee rerlacements x4 2 times each knee    . Colonoscopy N/A 02/25/2013    + polypectomy x 1: Procedure: COLONOSCOPY;  Surgeon: Juanita Craver, MD;  Location: [facility];  Service: Endoscopy;  Laterality: N/A;  . Breast biopsy Right 2014    Outpatient Prescriptions Prior to Visit  Medication Sig Dispense Refill  . albuterol (PROAIR HFA) 108 (90 BASE) MCG/ACT inhaler Inhale 1-2 puffs into the lungs every 6 (six) hours as needed for wheezing or shortness of breath. 1 Inhaler 1  . clobetasol ointment (TEMOVATE) 0.05 % APPLY TOPICALLY 2 (TWO) TIMES DAILY. 60 g 0  . enalapril (VASOTEC) 10 MG tablet Take 10 mg by mouth daily.    . Fluticasone-Salmeterol (ADVAIR DISKUS) 250-50 MCG/DOSE AEPB Inhale 1 puff into the lungs 2 (two) times daily. 60 each 3  . insulin aspart (NOVOLOG FLEXPEN) 100 UNIT/ML FlexPen Inject into the skin. Sliding scale: avg 8 U with each meal    . Insulin NPH Human, Isophane, (NOVOLIN N [place]) Inject 55 Units into the skin 3 (three) times daily.     Marland Kitchen levonorgestrel (MIRENA) 20 MCG/24HR IUD 1 each by Intrauterine route once.    Marland Kitchen levothyroxine (SYNTHROID, LEVOTHROID) 125 MCG tablet Take 125 mcg by mouth daily before breakfast.    . aspirin 81 MG tablet Take 81 mg by mouth daily.    . TURMERIC PO Take  by mouth.     No facility-administered medications prior to visit.    Allergies  Allergen Reactions  . Adhesive [Tape]   . Banana     Nausea and diarrhea  . Eggs Or Egg-Derived Products     Nausea and diarrhea  . Latex     sensativity  . Penicillins Rash    ROS As per HPI  PE: Blood pressure 141/76, pulse 84, temperature 98 F (36.7 C), temperature source Oral, resp. rate 16, weight 291 lb 6.4 oz (132.178 kg). Gen: Alert, well appearing, morbidly obese.  Patient is oriented to person, place, time, and situation. CV: RRR, no m/r/g.   LUNGS: CTA bilat, nonlabored resps, good aeration in all lung fields. L glut region TTP around ischial tuberosity.  No other area of tenderness. She has muscular tightness in LB/thighs/hamstrings No pain in lateral hip with palpation. ROM hip: no pain with flex/ext, or IR/ER.   Sitting SLR on L feels a bit tight but no distinct radiating pain or paresthesias.  LABS:  None today   IMPRESSION AND PLAN:  1) Mod pers asthma/cough variant asthma: The current medical regimen is effective;  continue present plan and medications. She'll seek flu vaccine at a pharmacy that has the vaccine for pt's with egg allergy.  2) L sciatica: with lots of tight core and leg muscles. I think she would benefit from PT as well as steroids: prednisone 40mg  qd x 5d, then 20mg  qd x 5d. Avoid NSAIDs while on prednisone. She declined any narcotic med pain short term.  An After Visit Summary was printed and given to the patient.  FOLLOW UP: Return in about 2 months (around 03/04/2015) for f/u sciatica and asthma.

## 2015-01-02 NOTE — Patient Instructions (Signed)
While on prednisone, take tylenol 1000 mg every 6 hours as needed for pain.  After prednisone is finished, you may resume aleve as you are currently taking it.

## 2015-01-19 ENCOUNTER — Other Ambulatory Visit: Payer: Self-pay | Admitting: *Deleted

## 2015-01-19 MED ORDER — FLUTICASONE-SALMETEROL 250-50 MCG/DOSE IN AEPB: 1.0000 | INHALATION_SPRAY | Freq: Two times a day (BID) | RESPIRATORY_TRACT | Status: DC

## 2015-01-19 MED ORDER — ALBUTEROL SULFATE HFA 108 (90 BASE) MCG/ACT IN AERS: 1.0000 | INHALATION_SPRAY | Freq: Four times a day (QID) | RESPIRATORY_TRACT | Status: DC | PRN

## 2015-01-19 NOTE — Telephone Encounter (Signed)
RF request for advair LOV: 01/02/15 Next ov: 03/10/15 Last written: 10/31/14 #60 w/ 3RF  Accidentally sent in proair, called pharmacy and had them cancel the Rx for proair.

## 2015-03-10 ENCOUNTER — Other Ambulatory Visit: Payer: Self-pay | Admitting: Obstetrics & Gynecology

## 2015-03-10 ENCOUNTER — Ambulatory Visit: Payer: BLUE CROSS/BLUE SHIELD | Admitting: Family Medicine

## 2015-03-11 ENCOUNTER — Encounter: Payer: Self-pay | Admitting: Family Medicine

## 2015-03-11 ENCOUNTER — Ambulatory Visit (INDEPENDENT_AMBULATORY_CARE_PROVIDER_SITE_OTHER): Payer: BLUE CROSS/BLUE SHIELD | Admitting: Family Medicine

## 2015-03-11 VITALS — BP 166/91 | HR 76 | Temp 97.9°F | Resp 16 | Ht 60.75 in | Wt 297.0 lb

## 2015-03-11 DIAGNOSIS — Z794 Long term (current) use of insulin: Secondary | ICD-10-CM

## 2015-03-11 DIAGNOSIS — M545 Low back pain, unspecified: Secondary | ICD-10-CM

## 2015-03-11 DIAGNOSIS — J45991 Cough variant asthma: Secondary | ICD-10-CM

## 2015-03-11 DIAGNOSIS — I10 Essential (primary) hypertension: Secondary | ICD-10-CM

## 2015-03-11 DIAGNOSIS — E039 Hypothyroidism, unspecified: Secondary | ICD-10-CM | POA: Diagnosis not present

## 2015-03-11 DIAGNOSIS — E119 Type 2 diabetes mellitus without complications: Secondary | ICD-10-CM

## 2015-03-11 DIAGNOSIS — J4541 Moderate persistent asthma with (acute) exacerbation: Secondary | ICD-10-CM

## 2015-03-11 LAB — TSH: TSH: 1.21 u[IU]/mL (ref 0.35–4.50)

## 2015-03-11 LAB — HEMOGLOBIN A1C: HEMOGLOBIN A1C: 8.5 % — AB (ref 4.6–6.5)

## 2015-03-11 MED ORDER — METFORMIN HCL 500 MG PO TABS: 500.0000 mg | ORAL_TABLET | Freq: Two times a day (BID) | ORAL | Status: DC

## 2015-03-11 MED ORDER — METHYLPREDNISOLONE ACETATE 80 MG/ML IJ SUSP
80.0000 mg | Freq: Once | INTRAMUSCULAR | Status: AC
  Administered 2015-03-11: 80 mg via INTRAMUSCULAR

## 2015-03-11 NOTE — Progress Notes (Signed)
Pre visit review using our clinic review tool, if applicable. No additional management support is needed unless otherwise documented below in the visit note. 

## 2015-03-11 NOTE — Progress Notes (Signed)
OFFICE VISIT  03/11/2015   CC:  Chief Complaint  Patient presents with  . Follow-up    Pt is fasting.    HPI:    Patient is a 64 y.o. Caucasian female who presents for 2 mo f/u cough-variant asthma and HTN. Wants me to start managing her DM/hypoth b/c of difficulty getting in to see her endocrinologist. Pt has hx of diarrhea on metformin capsules in remote past but wants to try metformin tabs to see if any better from side effect standpoint.  Denies any known hx of renal insufficiency on lab testing with Dr. Chalmers Cater over the last few years.  Says home gluc's avg 140-160 lately, higher into 300s for brief period when on prednisone. Home bp checks always normal, pt reports hx of white coat component to her HTN. Compliant with all chronic meds. Unknown exactly when last a1c was---about a year--and it was 6.5 % at that time. Last TSH wnl about a year ago.  Her left sided sciatica is much improved, she is working on activity modification and doing stretching exercises.  Still some intermittent LBP when standing for long periods.  Has been taking tylenol regularly, occ takes an aleve.  Last few days has had more coughing fits and wheezing.  Rescue inhaler helps for a few hours. No SOB, no chest pain, no fevers.  Mild nasal congestion lately but no ST or HA or body aches.   Past Medical History  Diagnosis Date  . Nephrolithiasis 4/07  . GERD (gastroesophageal reflux disease)   . Diabetes mellitus ([place])   . Obesity   . OSA on CPAP   . Hemorrhoids   . Complex endometrial hyperplasia with atypia 12/12    Most recent endometrial bx was 10/2012-NEG  . Cellulitis 5/12    Left (Since replacemnt of Left knee  . Encounter for insertion of mirena IUD 5/12  . Fracture, foot 8/14    left foot stress fracture recently out of boot/cortisone injections  . Exercise-induced bronchospasm     as child/young adult  . Hypothyroidism   . History of adenomatous polyp of colon 02/25/13    5 mm cecal  polyp removed by Dr. Trevor Mace 5 yrs  . Hyperlipidemia     Not on statin b/c lipids "stayed down when sugars came down" per pt.  She says Dr. Chalmers Cater knows she is not on statin anymore.  . Zoon's vulvitis     Bx-proven (GYN) -lichen sclerosis  . Anemia   . Arthritis   . Hypertension   . History of blood transfusion   . UTI (lower urinary tract infection)     Past Surgical History  Procedure Laterality Date  . Ankle fracture surgery  1984    Pin & repair  . Carpal tunnel release Right [id number]    1989 Left  . Tubal ligation  1985    C-Section  . Cholecystectomy  1988  . Arthroscopic repair acl  2000    Due to ACL tear  . Cardiac catheterization  5/07    clear vessel mild mitral   . Arthroscopic repair acl  5/08  . Dilation and curettage of uterus  12/11  . Combined hysteroscopy diagnostic / d&c  2/12    Bx neg  . Replacement total knee Left 5/12    X 2 on each  . Blateral knee rerlacements x4 2 times each knee    . Colonoscopy N/A 02/25/2013    + polypectomy x 1: Procedure: COLONOSCOPY;  Surgeon: Juanita Craver, MD;  Location: [facility];  Service: Endoscopy;  Laterality: N/A;  . Breast biopsy Right 2014    Outpatient Prescriptions Prior to Visit  Medication Sig Dispense Refill  . albuterol (PROAIR HFA) 108 (90 BASE) MCG/ACT inhaler Inhale 1-2 puffs into the lungs every 6 (six) hours as needed for wheezing or shortness of breath. 3 Inhaler 1  . aspirin 81 MG tablet Take 81 mg by mouth daily.    . clobetasol ointment (TEMOVATE) 0.05 % APPLY TOPICALLY 2 (TWO) TIMES DAILY. 60 g 0  . enalapril (VASOTEC) 10 MG tablet Take 10 mg by mouth daily.    . Fluticasone-Salmeterol (ADVAIR DISKUS) 250-50 MCG/DOSE AEPB Inhale 1 puff into the lungs 2 (two) times daily. 180 each 1  . insulin aspart (NOVOLOG FLEXPEN) 100 UNIT/ML FlexPen Inject into the skin. Sliding scale: avg 8 U with each meal    . Insulin NPH Human, Isophane, (NOVOLIN N [place]) Inject 55 Units into the skin 3 (three) times  daily.     Marland Kitchen levonorgestrel (MIRENA) 20 MCG/24HR IUD 1 each by Intrauterine route once.    Marland Kitchen levothyroxine (SYNTHROID, LEVOTHROID) 125 MCG tablet Take 125 mcg by mouth daily before breakfast.    . predniSONE (DELTASONE) 20 MG tablet 2 tabs po qd x 5d, then 1 tab po qd x 5d (Patient not taking: Reported on 03/11/2015) 15 tablet 0  . TURMERIC PO Take by mouth.     No facility-administered medications prior to visit.    Allergies  Allergen Reactions  . Adhesive [Tape]   . Banana     Nausea and diarrhea  . Eggs Or Egg-Derived Products     Nausea and diarrhea  . Latex     sensativity  . Penicillins Rash    ROS As per HPI  PE: Blood pressure 166/91, pulse 76, temperature 97.9 F (36.6 C), temperature source Oral, resp. rate 16, height 5' 0.75" (1.543 m), weight 297 lb (134.718 kg), SpO2 93 %. Gen: Alert, well appearing.  Patient is oriented to person, place, time, and situation. CV: RRR, no m/r/g.   LUNGS: CTA bilat, nonlabored resps, good aeration in all lung fields.  Some post-exhalation coughing noted.   LABS:   Lab Results  Component Value Date   WBC 7.5 08/18/2010   HGB 10.2* 08/18/2010   HCT 30.9* 08/18/2010   MCV 94.5 08/18/2010   PLT 230 08/18/2010   Lab Results  Component Value Date   CREATININE 0.70 08/18/2010   BUN 10 08/18/2010   NA 139 08/18/2010   K 3.8 08/18/2010   CL 102 08/18/2010   CO2 30 08/18/2010   Lab Results  Component Value Date   ALT 30 08/05/2010   AST 27 08/05/2010   ALKPHOS 92 08/05/2010   BILITOT 0.5 08/05/2010   IMPRESSION AND PLAN:  1) HTN; The current medical regimen is effective;  continue present plan and medications.  2) Hypothyroidism: TSH recheck today.  3) DM 2; up until today her mgmt has been via Dr. Chalmers Cater, endocrinologist, but pt wants to switch to having me manage this now.  Continue current insulin regimen: NPH 60 U tid, novolog AC per sliding scale (avg 8 U). We'll add back metformin in TAB form per her request,  500mg  qhs and titrate to 500 mg bid slowly: we'll see how she tolerates this from a GI standpoint compared to the metformin caps. HbA1c check today.  4) Cough variant asthma/mod persistent asthma: not ideal control vs slight flare. Depo medrol 80 mg IM today, continue  all current controller and prn meds.  5) Lumbar radiculopathy vs sciatica pain; resolved, now with intermittent LBP diffusely.  Continue stretches/strengthening, wt loss efforts, and prn tylenol or NSAID.  An After Visit Summary was printed and given to the patient.  FOLLOW UP: 6 wks

## 2015-03-11 NOTE — Telephone Encounter (Signed)
Medication refill request: clobetasol ointment Last AEX:  05-05-14 Next AEX: 07-02-15 Last MMG (if hormonal medication request): 11/15/12 MMG, 12/14/12 additional views/us-normal Refill authorized: last given 10-20-14 60 grams with 0 refills. Please approve or deny.

## 2015-03-16 ENCOUNTER — Ambulatory Visit: Payer: BLUE CROSS/BLUE SHIELD | Admitting: Family Medicine

## 2015-03-19 ENCOUNTER — Ambulatory Visit: Payer: BLUE CROSS/BLUE SHIELD | Admitting: Family Medicine

## 2015-03-20 ENCOUNTER — Ambulatory Visit (INDEPENDENT_AMBULATORY_CARE_PROVIDER_SITE_OTHER): Payer: BLUE CROSS/BLUE SHIELD

## 2015-03-20 DIAGNOSIS — Z23 Encounter for immunization: Secondary | ICD-10-CM

## 2015-04-21 ENCOUNTER — Ambulatory Visit: Payer: BLUE CROSS/BLUE SHIELD | Admitting: Family Medicine

## 2015-04-28 ENCOUNTER — Ambulatory Visit (INDEPENDENT_AMBULATORY_CARE_PROVIDER_SITE_OTHER): Payer: BLUE CROSS/BLUE SHIELD | Admitting: Family Medicine

## 2015-04-28 ENCOUNTER — Encounter: Payer: Self-pay | Admitting: Family Medicine

## 2015-04-28 VITALS — BP 140/71 | HR 85 | Temp 97.8°F | Resp 16 | Ht 60.75 in | Wt 288.0 lb

## 2015-04-28 DIAGNOSIS — E119 Type 2 diabetes mellitus without complications: Secondary | ICD-10-CM | POA: Diagnosis not present

## 2015-04-28 DIAGNOSIS — M778 Other enthesopathies, not elsewhere classified: Secondary | ICD-10-CM

## 2015-04-28 DIAGNOSIS — M19041 Primary osteoarthritis, right hand: Secondary | ICD-10-CM

## 2015-04-28 DIAGNOSIS — J4540 Moderate persistent asthma, uncomplicated: Secondary | ICD-10-CM

## 2015-04-28 LAB — COMPREHENSIVE METABOLIC PANEL
ALBUMIN: 3.7 g/dL (ref 3.5–5.2)
ALT: 20 U/L (ref 0–35)
AST: 17 U/L (ref 0–37)
Alkaline Phosphatase: 60 U/L (ref 39–117)
BILIRUBIN TOTAL: 0.4 mg/dL (ref 0.2–1.2)
BUN: 22 mg/dL (ref 6–23)
CALCIUM: 9.4 mg/dL (ref 8.4–10.5)
CHLORIDE: 101 meq/L (ref 96–112)
CO2: 30 mEq/L (ref 19–32)
CREATININE: 0.91 mg/dL (ref 0.40–1.20)
GFR: 66.08 mL/min (ref >60.00)
Glucose, Bld: 149 mg/dL — ABNORMAL HIGH (ref 70–99)
Potassium: 4.2 mEq/L (ref 3.5–5.1)
Sodium: 140 mEq/L (ref 135–145)
Total Protein: 7.1 g/dL (ref 6.0–8.3)

## 2015-04-28 NOTE — Progress Notes (Signed)
Pre visit review using our clinic review tool, if applicable. No additional management support is needed unless otherwise documented below in the visit note. 

## 2015-04-28 NOTE — Progress Notes (Signed)
OFFICE VISIT  04/28/2015   CC:  Chief Complaint  Patient presents with  . Follow-up    Pt is not fasting.      HPI:    Patient is a 65 y.o. Caucasian female who presents for 6 week f/u cough variant asthma and DM 2. Last visit her A1c was 8.5% so we increased her Novolin NPH to 57 U tid but she failed to make this change--continued with 55 U tid, and we did a re-trial of metformin in tab form to see if this caused less GI upset for her compared to the cap form she had previously tried. I also gave her a decadron injection last visit for her asthma.  Tolerating 1 metformin tab daily w/out GI side effect. Glucoses: fasting range 85-140s--improved!.  Not checking any other time of day. Novolin NPH 55 tid, gives 8-10 novolog depending on what she eats. She is getting more active with walking more, trying to stay moving.  Doing pretty well regarding cough/asthma.  Compliant with advair, not requiring much prn albuterol.  Had a 24h period of nausea w/out vomiting, gassy, some diarrhea--this past weekend.  Still feeling gassy. No fevers.    Says she has had some right wrist pain recently, no trauma or overuse.  Hurts along ulnar aspect extending some up the distal forearm.  Worse with bending wrist.  Also some hx of intermittent achiness and mild swelling of IP joints of fingers, R hand worse than L.   Denies tingling or numbness in palm or fingers.   Past Medical History  Diagnosis Date  . Nephrolithiasis 4/07  . GERD (gastroesophageal reflux disease)   . Diabetes mellitus ([place])   . Obesity   . OSA on CPAP   . Hemorrhoids   . Complex endometrial hyperplasia with atypia 12/12    Most recent endometrial bx was 10/2012-NEG  . Cellulitis 5/12    Left (Since replacemnt of Left knee  . Encounter for insertion of mirena IUD 5/12  . Fracture, foot 8/14    left foot stress fracture recently out of boot/cortisone injections  . Exercise-induced bronchospasm     as child/young adult  .  Hypothyroidism   . History of adenomatous polyp of colon 02/25/13    5 mm cecal polyp removed by Dr. Trevor Mace 5 yrs  . Hyperlipidemia     Not on statin b/c lipids "stayed down when sugars came down" per pt.  She says Dr. Chalmers Cater knows she is not on statin anymore.  . Zoon's vulvitis     Bx-proven (GYN) -lichen sclerosis  . Anemia   . Arthritis   . Hypertension   . History of blood transfusion   . UTI (lower urinary tract infection)     Past Surgical History  Procedure Laterality Date  . Ankle fracture surgery  1984    Pin & repair  . Carpal tunnel release Right [id number]    1989 Left  . Tubal ligation  1985    C-Section  . Cholecystectomy  1988  . Arthroscopic repair acl  2000    Due to ACL tear  . Cardiac catheterization  5/07    clear vessel mild mitral   . Arthroscopic repair acl  5/08  . Dilation and curettage of uterus  12/11  . Combined hysteroscopy diagnostic / d&c  2/12    Bx neg  . Replacement total knee Left 5/12    X 2 on each  . Blateral knee rerlacements x4 2 times each knee    .  Colonoscopy N/A 02/25/2013    + polypectomy x 1: Procedure: COLONOSCOPY;  Surgeon: Juanita Craver, MD;  Location: [facility];  Service: Endoscopy;  Laterality: N/A;  . Breast biopsy Right 2014    Outpatient Prescriptions Prior to Visit  Medication Sig Dispense Refill  . albuterol (PROAIR HFA) 108 (90 BASE) MCG/ACT inhaler Inhale 1-2 puffs into the lungs every 6 (six) hours as needed for wheezing or shortness of breath. 3 Inhaler 1  . aspirin 81 MG tablet Take 81 mg by mouth daily.    . clobetasol ointment (TEMOVATE) 0.05 % Apply topically 2 (two) times daily. Use for no more than 7 days at a time. 60 g 0  . enalapril (VASOTEC) 10 MG tablet Take 10 mg by mouth daily.    . Fluticasone-Salmeterol (ADVAIR DISKUS) 250-50 MCG/DOSE AEPB Inhale 1 puff into the lungs 2 (two) times daily. 180 each 1  . insulin aspart (NOVOLOG FLEXPEN) 100 UNIT/ML FlexPen Inject into the skin. Sliding scale:  avg 8 U with each meal    . Insulin NPH Human, Isophane, (NOVOLIN N [place]) Inject 55 Units into the skin 3 (three) times daily.     Marland Kitchen levonorgestrel (MIRENA) 20 MCG/24HR IUD 1 each by Intrauterine route once.    Marland Kitchen levothyroxine (SYNTHROID, LEVOTHROID) 125 MCG tablet Take 125 mcg by mouth daily before breakfast.    . metFORMIN (GLUCOPHAGE) 500 MG tablet Take 1 tablet (500 mg total) by mouth 2 (two) times daily with a meal. 60 tablet 12   No facility-administered medications prior to visit.    Allergies  Allergen Reactions  . Adhesive [Tape]   . Banana     Nausea and diarrhea  . Eggs Or Egg-Derived Products     Nausea and diarrhea  . Latex     sensativity  . Penicillins Rash  . Pine Swelling and Rash  . Rose Swelling and Rash    ROS As per HPI  PE: Blood pressure 140/71, pulse 85, temperature 97.8 F (36.6 C), temperature source Oral, resp. rate 16, height 5' 0.75" (1.543 m), weight 288 lb (130.636 kg), SpO2 95 %.  Gen: Alert, well appearing.  Patient is oriented to person, place, time, and situation. CV: RRR, no m/r/g.   LUNGS: CTA bilat, nonlabored resps, good aeration in all lung fields. Right wrist: mild TTP along ulnar aspect of wrist, extending just a bit up the distal forearm.  No wrist swelling or warmth or erythema.  ROM intact.  Fingers with intact ROM and without swelling, erythema, or warmth.   Foot exam - no swelling, tenderness or skin or vascular lesions. Color and temperature is normal. Sensation is intact. Peripheral pulses are palpable. Toenails are normal thick and a bit long.    LABS:  Lab Results  Component Value Date   HGBA1C 8.5* 03/11/2015   Lab Results  Component Value Date   TSH 1.21 03/11/2015   Lab Results  Component Value Date   WBC 7.5 08/18/2010   HGB 10.2* 08/18/2010   HCT 30.9* 08/18/2010   MCV 94.5 08/18/2010   PLT 230 08/18/2010   Lab Results  Component Value Date   CREATININE 0.70 08/18/2010   BUN 10 08/18/2010   NA 139  08/18/2010   K 3.8 08/18/2010   CL 102 08/18/2010   CO2 30 08/18/2010   Lab Results  Component Value Date   ALT 30 08/05/2010   AST 27 08/05/2010   ALKPHOS 92 08/05/2010   BILITOT 0.5 08/05/2010   IMPRESSION AND PLAN:  1) DM 2; control improved since last visit. Feet exam normal today.  She was reminded of need for update of her diab retpthy screening exam. Continue current insulin doses and increase metformin to 500 mg bid. We'll slowly get her to 1000 mg bid of this med since she has hx of GI intolerance to it. Check CMET today.  2) Cough variant asthma/moderate persistent asthma: stable on advair 250/50.  3) Recent short GI illness: seems to be resolving at this point.  No treatments recommended at this time.  4) Right wrist tendonitis and hand/finger arthralgias:  Reassured, pt to continue with wrist splint prn and use NSAIDs sparingly as she is currently doing.  5) Preventative health care: pt aware she is due for mammogram and when I asked her if she wanted me to schedule her for this she said "I'll eventually take care of it myself".  An After Visit Summary was printed and given to the patient.  FOLLOW UP: Return in about 2 months (around 06/26/2015) for routine chronic illness f/u (30 min).

## 2015-05-15 ENCOUNTER — Other Ambulatory Visit: Payer: Self-pay | Admitting: Obstetrics & Gynecology

## 2015-05-15 DIAGNOSIS — Z1231 Encounter for screening mammogram for malignant neoplasm of breast: Secondary | ICD-10-CM

## 2015-05-19 ENCOUNTER — Ambulatory Visit (HOSPITAL_BASED_OUTPATIENT_CLINIC_OR_DEPARTMENT_OTHER): Payer: BLUE CROSS/BLUE SHIELD

## 2015-05-19 ENCOUNTER — Telehealth: Payer: Self-pay | Admitting: Obstetrics & Gynecology

## 2015-05-19 NOTE — Telephone Encounter (Signed)
Spoke with patient. She is scheduled for an IUD removal on 06/04/2015 and an aex on 07/02/2015 with Dr.Kham. She is asking if her IUD removal can be performed at her aex. Advised IUD can be removed at her annual. Appointment for 06/04/2015 cancelled. Note added to aex on 07/02/2015 that she will need IUD removal.  Routing to provider for final review prior to closing.

## 2015-05-19 NOTE — Telephone Encounter (Signed)
Agree with changing to one appt.  Thanks.  Encounter closed.

## 2015-05-19 NOTE — Telephone Encounter (Signed)
Patient has an appointment scheduled for 06/04/15 for a procedure and 07/02/15 for aex. She's wondering if she can have both appointments done in one day.

## 2015-05-29 ENCOUNTER — Other Ambulatory Visit: Payer: Self-pay | Admitting: Obstetrics & Gynecology

## 2015-05-29 ENCOUNTER — Ambulatory Visit (HOSPITAL_BASED_OUTPATIENT_CLINIC_OR_DEPARTMENT_OTHER)
Admission: RE | Admit: 2015-05-29 | Discharge: 2015-05-29 | Disposition: A | Discharge status: Home / Self Care | Payer: BLUE CROSS/BLUE SHIELD | Source: Ambulatory Visit | Attending: Obstetrics & Gynecology | Admitting: Obstetrics & Gynecology

## 2015-05-29 DIAGNOSIS — Z1231 Encounter for screening mammogram for malignant neoplasm of breast: Secondary | ICD-10-CM

## 2015-06-01 ENCOUNTER — Encounter: Payer: Self-pay | Admitting: Family Medicine

## 2015-06-01 ENCOUNTER — Ambulatory Visit (INDEPENDENT_AMBULATORY_CARE_PROVIDER_SITE_OTHER): Payer: BLUE CROSS/BLUE SHIELD | Admitting: Family Medicine

## 2015-06-01 VITALS — BP 133/82 | HR 86 | Temp 98.6°F | Resp 20 | Wt 290.2 lb

## 2015-06-01 DIAGNOSIS — J40 Bronchitis, not specified as acute or chronic: Secondary | ICD-10-CM | POA: Insufficient documentation

## 2015-06-01 DIAGNOSIS — J45991 Cough variant asthma: Secondary | ICD-10-CM | POA: Diagnosis not present

## 2015-06-01 MED ORDER — PREDNISONE 20 MG PO TABS: 20.0000 mg | ORAL_TABLET | Freq: Every day | ORAL | Status: DC

## 2015-06-01 MED ORDER — HYDROCODONE-HOMATROPINE 5-1.5 MG/5ML PO SYRP
5.0000 mL | ORAL_SOLUTION | Freq: Two times a day (BID) | ORAL | Status: DC | PRN
Start: 2015-06-01 — End: 2015-10-20

## 2015-06-01 MED ORDER — AZITHROMYCIN 250 MG PO TABS: ORAL_TABLET | ORAL | Status: DC

## 2015-06-01 NOTE — Patient Instructions (Signed)
- Rest,hydrate, prednisone, z-pack - albuterol every 4-6 hours PRN.  - Hycodan, no refills,try to use at night only.  - F/U as needed, or 1 week if no improvement.   Acute Bronchitis Bronchitis is inflammation of the airways that extend from the windpipe into the lungs (bronchi). The inflammation often causes mucus to develop. This leads to a cough, which is the most common symptom of bronchitis.  In acute bronchitis, the condition usually develops suddenly and goes away over time, usually in a couple weeks. Smoking, allergies, and asthma can make bronchitis worse. Repeated episodes of bronchitis may cause further lung problems.  CAUSES Acute bronchitis is most often caused by the same virus that causes a cold. The virus can spread from person to person (contagious) through coughing, sneezing, and touching contaminated objects. SIGNS AND SYMPTOMS   Cough.   Fever.   Coughing up mucus.   Body aches.   Chest congestion.   Chills.   Shortness of breath.   Sore throat.  DIAGNOSIS  Acute bronchitis is usually diagnosed through a physical exam. Your health care provider will also ask you questions about your medical history. Tests, such as chest X-rays, are sometimes done to rule out other conditions.  TREATMENT  Acute bronchitis usually goes away in a couple weeks. Oftentimes, no medical treatment is necessary. Medicines are sometimes given for relief of fever or cough. Antibiotic medicines are usually not needed but may be prescribed in certain situations. In some cases, an inhaler may be recommended to help reduce shortness of breath and control the cough. A cool mist vaporizer may also be used to help thin bronchial secretions and make it easier to clear the chest.  HOME CARE INSTRUCTIONS  Get plenty of rest.   Drink enough fluids to keep your urine clear or pale yellow (unless you have a medical condition that requires fluid restriction). Increasing fluids may help thin  your respiratory secretions (sputum) and reduce chest congestion, and it will prevent dehydration.   Take medicines only as directed by your health care provider.  If you were prescribed an antibiotic medicine, finish it all even if you start to feel better.  Avoid smoking and secondhand smoke. Exposure to cigarette smoke or irritating chemicals will make bronchitis worse. If you are a smoker, consider using nicotine gum or skin patches to help control withdrawal symptoms. Quitting smoking will help your lungs heal faster.   Reduce the chances of another bout of acute bronchitis by washing your hands frequently, avoiding people with cold symptoms, and trying not to touch your hands to your mouth, nose, or eyes.   Keep all follow-up visits as directed by your health care provider.  SEEK MEDICAL CARE IF: Your symptoms do not improve after 1 week of treatment.  SEEK IMMEDIATE MEDICAL CARE IF:  You develop an increased fever or chills.   You have chest pain.   You have severe shortness of breath.  You have bloody sputum.   You develop dehydration.  You faint or repeatedly feel like you are going to pass out.  You develop repeated vomiting.  You develop a severe headache. MAKE SURE YOU:   Understand these instructions.  Will watch your condition.  Will get help right away if you are not doing well or get worse.   This information is not intended to replace advice given to you by your health care provider. Make sure you discuss any questions you have with your health care provider.   Document Released:  05/05/2004 Document Revised: 04/18/2014 Document Reviewed: 09/18/2012 Elsevier Interactive Patient Education [employer].

## 2015-06-01 NOTE — Progress Notes (Signed)
Patient ID: [name], female   DOB: [birth year], 65 y.o.   MRN: [id number]    [name] , [birth year], 65 y.o., female MRN: [id number]  CC: cough Subjective: Pt presents for an acute OV with complaints of cough of 5 days duration. Associated symptoms include fever, chills, nausea, wheezing and chest tightness.Pt denies  abd pain, diarrhea or rash. Pt has tried  Inhaler, robitussin DM, tylenol to ease their symptoms.  Her daughter had a cold last week.  Asthma history. Using inhalers Advair and albuterol.  UTD flu and tdap  Allergies  Allergen Reactions  . Adhesive [Tape]   . Banana     Nausea and diarrhea  . Eggs Or Egg-Derived Products     Nausea and diarrhea  . Latex     sensativity  . Penicillins Rash  . Pine Swelling and Rash  . Rose Swelling and Rash   Social History  Substance Use Topics  . Smoking status: Never Smoker   . Smokeless tobacco: Never Used  . Alcohol Use: No   Past Medical History  Diagnosis Date  . Nephrolithiasis 4/07  . GERD (gastroesophageal reflux disease)   . Diabetes mellitus ([place])   . Obesity   . OSA on CPAP   . Hemorrhoids   . Complex endometrial hyperplasia with atypia 12/12    Most recent endometrial bx was 10/2012-NEG  . Cellulitis 5/12    Left (Since replacemnt of Left knee  . Encounter for insertion of mirena IUD 5/12  . Fracture, foot 8/14    Hx of left foot stress fracture  . Moderate persistent asthma     cough-variant  . Hypothyroidism   . History of adenomatous polyp of colon 02/25/13    5 mm cecal polyp removed by Dr. Trevor Mace 5 yrs  . Hyperlipidemia     Not on statin b/c lipids "stayed down when sugars came down" per pt.  She says Dr. Chalmers Cater knows she is not on statin anymore.  . Zoon's vulvitis     Bx-proven (GYN) -lichen sclerosis  . Anemia   . Arthritis   . Hypertension   . History of blood transfusion   . UTI (lower urinary tract infection)    Past Surgical History  Procedure Laterality Date  . Ankle  fracture surgery  1984    Pin & repair  . Carpal tunnel release Right [id number]    1989 Left  . Tubal ligation  1985    C-Section  . Cholecystectomy  1988  . Arthroscopic repair acl  2000    Due to ACL tear  . Cardiac catheterization  5/07    clear vessel mild mitral   . Arthroscopic repair acl  5/08  . Dilation and curettage of uterus  12/11  . Combined hysteroscopy diagnostic / d&c  2/12    Bx neg  . Replacement total knee Left 5/12    X 2 on each  . Blateral knee rerlacements x4 2 times each knee    . Colonoscopy N/A 02/25/2013    + polypectomy x 1: Procedure: COLONOSCOPY;  Surgeon: Juanita Craver, MD;  Location: [facility];  Service: Endoscopy;  Laterality: N/A;  . Breast biopsy Right 2014   Family History  Problem Relation Age of Onset  . Other Son     VSD/pulmonary atresia  . Diabetes Father   . COPD Father   . Stroke Father   . Rheum arthritis Brother   . Thyroid disease Brother   . Diabetes Brother   .  Arthritis Mother   . Alzheimer's disease Mother      Medication List       This list is accurate as of: 06/01/15  3:05 PM.  Always use your most recent med list.               albuterol 108 (90 Base) MCG/ACT inhaler  Commonly known as:  PROAIR HFA  Inhale 1-2 puffs into the lungs every 6 (six) hours as needed for wheezing or shortness of breath.     aspirin 81 MG tablet  Take 81 mg by mouth daily.     clobetasol ointment 0.05 %  Commonly known as:  TEMOVATE  Apply topically 2 (two) times daily. Use for no more than 7 days at a time.     enalapril 10 MG tablet  Commonly known as:  VASOTEC  Take 10 mg by mouth daily.     Fluticasone-Salmeterol 250-50 MCG/DOSE Aepb  Commonly known as:  ADVAIR DISKUS  Inhale 1 puff into the lungs 2 (two) times daily.     levonorgestrel 20 MCG/24HR IUD  Commonly known as:  MIRENA  1 each by Intrauterine route once.     levothyroxine 125 MCG tablet  Commonly known as:  SYNTHROID, LEVOTHROID  Take 125 mcg by mouth  daily before breakfast.     metFORMIN 500 MG tablet  Commonly known as:  GLUCOPHAGE  Take 1 tablet (500 mg total) by mouth 2 (two) times daily with a meal.     NOVOLIN N [place]  Inject 55 Units into the skin 3 (three) times daily.     NOVOLOG FLEXPEN 100 UNIT/ML FlexPen  Generic drug:  insulin aspart  Inject into the skin. Sliding scale: avg 8 U with each meal       ROS: Negative, with the exception of above mentioned in HPI  Objective:  BP 133/82 mmHg  Pulse 86  Temp(Src) 98.6 F (37 C)  Resp 20  Wt 290 lb 4 oz (131.657 kg)  SpO2 96% Body mass index is 55.3 kg/(m^2). Gen: Afebrile. No acute distress. Nontoxic in appearance, well developed obese caucasian female.  HENT: AT. [place]. Bilateral TM visualized and normal in appearance. MMM, no oral lesions. Bilateral nares with erythema. Throat without erythema or exudates. Dry cough on exam. No TTP facial sinus. Mild hoarseness on exam.  Eyes:Pupils Equal Round Reactive to light, Extraocular movements intact,  Conjunctiva without redness, discharge or icterus. Neck/lymp/endocrine: Supple, mild anterior cervical lymphadenopathy CV: RRR  Chest: wheeze present RLL (mild), otherwise CTAB Good air movement, normal resp effort.  Abd: Soft.  NTND. BS present Neuro: Normal gait. PERLA. EOMi. Alert. Oriented x3  Assessment/Plan: [name] is a 65 y.o. female present for acute OV for Bronchitis with asthma excerbation.   - Rest,hydrate, prednisone, z-pack - albuterol every 4-6 hours PRN.  - Hycodan, no refills,try to use at night only.  - F/U as needed, or 1 week if no improvement.    Renee Raoul Pitch, DO  [place]

## 2015-06-04 ENCOUNTER — Ambulatory Visit: Payer: BLUE CROSS/BLUE SHIELD | Admitting: Obstetrics & Gynecology

## 2015-06-05 ENCOUNTER — Other Ambulatory Visit: Payer: Self-pay | Admitting: *Deleted

## 2015-06-05 MED ORDER — FLUTICASONE-SALMETEROL 250-50 MCG/DOSE IN AEPB: 1.0000 | INHALATION_SPRAY | Freq: Two times a day (BID) | RESPIRATORY_TRACT | Status: DC

## 2015-06-05 NOTE — Telephone Encounter (Signed)
RF request for advair LOV: 04/28/15 Next ov: 06/22/15 Last written: 01/19/15 #180 w/ 1RF

## 2015-06-11 ENCOUNTER — Other Ambulatory Visit: Payer: Self-pay | Admitting: *Deleted

## 2015-06-11 MED ORDER — METFORMIN HCL 500 MG PO TABS: 500.0000 mg | ORAL_TABLET | Freq: Two times a day (BID) | ORAL | Status: DC

## 2015-06-11 NOTE — Telephone Encounter (Signed)
RF request for metformin - request for 90 day supply.  LOV: 04/28/15 Next ov: 06/29/15 Last written: 03/11/15 #60 w/ 12

## 2015-06-22 ENCOUNTER — Ambulatory Visit: Payer: BLUE CROSS/BLUE SHIELD | Admitting: Family Medicine

## 2015-06-29 ENCOUNTER — Encounter: Payer: Self-pay | Admitting: Family Medicine

## 2015-06-29 ENCOUNTER — Ambulatory Visit (INDEPENDENT_AMBULATORY_CARE_PROVIDER_SITE_OTHER): Payer: BLUE CROSS/BLUE SHIELD | Admitting: Family Medicine

## 2015-06-29 VITALS — BP 152/75 | HR 85 | Temp 97.8°F | Ht 60.75 in | Wt 286.0 lb

## 2015-06-29 DIAGNOSIS — I10 Essential (primary) hypertension: Secondary | ICD-10-CM

## 2015-06-29 DIAGNOSIS — J4540 Moderate persistent asthma, uncomplicated: Secondary | ICD-10-CM | POA: Diagnosis not present

## 2015-06-29 DIAGNOSIS — E119 Type 2 diabetes mellitus without complications: Secondary | ICD-10-CM | POA: Diagnosis not present

## 2015-06-29 LAB — POCT GLYCOSYLATED HEMOGLOBIN (HGB A1C): HEMOGLOBIN A1C: 7.1

## 2015-06-29 MED ORDER — METFORMIN HCL 500 MG PO TABS: ORAL_TABLET | ORAL | Status: DC

## 2015-06-29 NOTE — Progress Notes (Signed)
OFFICE VISIT  06/29/2015   CC:  Chief Complaint  Patient presents with  . Follow-up     HPI:    Patient is a 65 y.o. Caucasian female who presents for 2 mo f/u mod persistent cough- variant asthma, HTN, and DM 2.  Diabetes: checking glucoses three times per day. fastings anywhere from 70s to 200, with the vast majority being around 120s-160s.  Later in the day also usually <160.   Had some increases while on prednisone taper about a month ago.  Asthma: recent episode of acute asthma flare (06/01/15, so she has had to use her albuterol 2-3 times a day on some days and none on some days.   Prior to this flare she was using her albut less than 1 time per week.  No home bp monitoring to report.  Says she has tendency to have white coat HTN.   Compliant with bp med.    Past Medical History  Diagnosis Date  . Nephrolithiasis 4/07  . GERD (gastroesophageal reflux disease)   . Diabetes mellitus ([place])   . Obesity   . OSA on CPAP   . Hemorrhoids   . Complex endometrial hyperplasia with atypia 12/12    Most recent endometrial bx was 10/2012-NEG  . Cellulitis 5/12    Left (Since replacemnt of Left knee  . Encounter for insertion of mirena IUD 5/12  . Fracture, foot 8/14    Hx of left foot stress fracture  . Moderate persistent asthma     cough-variant  . Hypothyroidism   . History of adenomatous polyp of colon 02/25/13    5 mm cecal polyp removed by Dr. Trevor Mace 5 yrs  . Hyperlipidemia     Not on statin b/c lipids "stayed down when sugars came down" per pt.  She says Dr. Chalmers Cater knows she is not on statin anymore.  . Zoon's vulvitis     Bx-proven (GYN) -lichen sclerosis  . Anemia   . Arthritis   . Hypertension   . History of blood transfusion   . UTI (lower urinary tract infection)     Past Surgical History  Procedure Laterality Date  . Ankle fracture surgery  1984    Pin & repair  . Carpal tunnel release Right [id number]    1989 Left  . Tubal ligation  1985   C-Section  . Cholecystectomy  1988  . Arthroscopic repair acl  2000    Due to ACL tear  . Cardiac catheterization  5/07    clear vessel mild mitral   . Arthroscopic repair acl  5/08  . Dilation and curettage of uterus  12/11  . Combined hysteroscopy diagnostic / d&c  2/12    Bx neg  . Replacement total knee Left 5/12    X 2 on each  . Blateral knee rerlacements x4 2 times each knee    . Colonoscopy N/A 02/25/2013    + polypectomy x 1: Procedure: COLONOSCOPY;  Surgeon: Juanita Craver, MD;  Location: [facility];  Service: Endoscopy;  Laterality: N/A;  . Breast biopsy Right 2014    Outpatient Prescriptions Prior to Visit  Medication Sig Dispense Refill  . albuterol (PROAIR HFA) 108 (90 BASE) MCG/ACT inhaler Inhale 1-2 puffs into the lungs every 6 (six) hours as needed for wheezing or shortness of breath. 3 Inhaler 1  . aspirin 81 MG tablet Take 81 mg by mouth daily.    . clobetasol ointment (TEMOVATE) 0.05 % Apply topically 2 (two) times daily. Use for no  more than 7 days at a time. 60 g 0  . enalapril (VASOTEC) 10 MG tablet Take 10 mg by mouth daily.    . Fluticasone-Salmeterol (ADVAIR DISKUS) 250-50 MCG/DOSE AEPB Inhale 1 puff into the lungs 2 (two) times daily. 180 each 1  . HYDROcodone-homatropine (HYCODAN) 5-1.5 MG/5ML syrup Take 5 mLs by mouth 2 (two) times daily as needed for cough. 120 mL 0  . insulin aspart (NOVOLOG FLEXPEN) 100 UNIT/ML FlexPen Inject into the skin. Sliding scale: avg 8 U with each meal    . Insulin NPH Human, Isophane, (NOVOLIN N [place]) Inject 55 Units into the skin 3 (three) times daily.     Marland Kitchen levonorgestrel (MIRENA) 20 MCG/24HR IUD 1 each by Intrauterine route once.    Marland Kitchen levothyroxine (SYNTHROID, LEVOTHROID) 125 MCG tablet Take 125 mcg by mouth daily before breakfast.    . metFORMIN (GLUCOPHAGE) 500 MG tablet Take 1 tablet (500 mg total) by mouth 2 (two) times daily with a meal. 180 tablet 1  . azithromycin (ZITHROMAX Z-PAK) 250 MG tablet 500 mg day 1 then 250 mg  QD 6 each 0  . predniSONE (DELTASONE) 20 MG tablet Take 1 tablet (20 mg total) by mouth daily with breakfast. 60 mg x3d, 40 mg x3d, 20 mg x2d, 10 mg x2d 18 tablet 0   No facility-administered medications prior to visit.    Allergies  Allergen Reactions  . Adhesive [Tape]   . Banana     Nausea and diarrhea  . Eggs Or Egg-Derived Products     Nausea and diarrhea  . Latex     sensativity  . Penicillins Rash  . Pine Swelling and Rash  . Rose Swelling and Rash    ROS As per HPI  PE: Blood pressure 152/75, pulse 85, temperature 97.8 F (36.6 C), temperature source Oral, height 5' 0.75" (1.543 m), weight 286 lb (129.729 kg), SpO2 96 %. Gen: Alert, well appearing.  Patient is oriented to person, place, time, and situation. CV: RRR, no m/r/g.   LUNGS: CTA bilat, nonlabored resps, good aeration in all lung fields.   LABS:  Lab Results  Component Value Date   TSH 1.21 03/11/2015   Lab Results  Component Value Date   WBC 7.5 08/18/2010   HGB 10.2* 08/18/2010   HCT 30.9* 08/18/2010   MCV 94.5 08/18/2010   PLT 230 08/18/2010   Lab Results  Component Value Date   CREATININE 0.91 04/28/2015   BUN 22 04/28/2015   NA 140 04/28/2015   K 4.2 04/28/2015   CL 101 04/28/2015   CO2 30 04/28/2015   Lab Results  Component Value Date   ALT 20 04/28/2015   AST 17 04/28/2015   ALKPHOS 60 04/28/2015   BILITOT 0.4 04/28/2015   Lab Results  Component Value Date   HGBA1C 7.1 06/29/2015   IMPRESSION AND PLAN:  1) DM 2: POCT HbA1c was 7.1% today. Will have her increase her metformin to 500 mg qAM and 1000 mg qhs. Encouraged once again to get screening diab retpthy exam.   No change in insulin dosing or monitoring schedule.  2) HTN; The current medical regimen is effective;  continue present plan and medications. Lytes/cr good 04/2015.  3) Mod persistent asthma-cough variant: stable, improving and getting back to baseline since exacerbation requiring systemic steroids 1 mo ago.   Continue advair and prn albuterol.  An After Visit Summary was printed and given to the patient.  FOLLOW UP: Return in about 3 months (around 09/29/2015)  for routine chronic illness f/u.

## 2015-07-02 ENCOUNTER — Ambulatory Visit (INDEPENDENT_AMBULATORY_CARE_PROVIDER_SITE_OTHER): Payer: BLUE CROSS/BLUE SHIELD | Admitting: Obstetrics & Gynecology

## 2015-07-02 ENCOUNTER — Encounter: Payer: Self-pay | Admitting: Obstetrics & Gynecology

## 2015-07-02 VITALS — BP 140/66 | HR 98 | Resp 18 | Ht 60.5 in | Wt 284.0 lb

## 2015-07-02 DIAGNOSIS — G4733 Obstructive sleep apnea (adult) (pediatric): Secondary | ICD-10-CM

## 2015-07-02 DIAGNOSIS — Z01419 Encounter for gynecological examination (general) (routine) without abnormal findings: Secondary | ICD-10-CM | POA: Diagnosis not present

## 2015-07-02 DIAGNOSIS — N762 Acute vulvitis: Secondary | ICD-10-CM | POA: Diagnosis not present

## 2015-07-02 DIAGNOSIS — N9089 Other specified noninflammatory disorders of vulva and perineum: Secondary | ICD-10-CM

## 2015-07-02 DIAGNOSIS — Z124 Encounter for screening for malignant neoplasm of cervix: Secondary | ICD-10-CM

## 2015-07-02 DIAGNOSIS — N8500 Endometrial hyperplasia, unspecified: Secondary | ICD-10-CM

## 2015-07-02 DIAGNOSIS — Z30432 Encounter for removal of intrauterine contraceptive device: Secondary | ICD-10-CM

## 2015-07-02 DIAGNOSIS — E038 Other specified hypothyroidism: Secondary | ICD-10-CM

## 2015-07-02 DIAGNOSIS — N763 Subacute and chronic vulvitis: Secondary | ICD-10-CM

## 2015-07-02 HISTORY — DX: Endometrial hyperplasia, unspecified: N85.00

## 2015-07-02 MED ORDER — CLOBETASOL PROPIONATE 0.05 % EX OINT: TOPICAL_OINTMENT | Freq: Two times a day (BID) | CUTANEOUS | Status: DC

## 2015-07-02 NOTE — Progress Notes (Signed)
65 y.o. [id number] MarriedCaucasianF here for annual exam.  Doing well.  No vaginal bleeding.  Biggest issue this past year is sciatica.  Last summer, she really started to have issues.  Has been evaluated with her PCP.  Treated with steroids and exercises.  She does have "twinges" of it now if she moves "the right way".    Pt reports weight got up to #298 and she is now #284.  She is watching diet carefully.  Just can't exercise a lot.  HbA1C was on Monday, 06/29/15.  This was 7.1.    PCP:  Dr. Anitra Lauth.  Patient's last menstrual period was 04/11/2010.          Sexually active: No.  The current method of family planning is IUD.    Exercising: No.  Walking Smoker:  no  Health Maintenance: Pap:  05/05/14 Neg History of abnormal Pap:  yes MMG:  06/02/15 BIRADS1:neg Colonoscopy:  02/25/2013 Polyps, repeat 5 years with Dr. Collene Mares BMD:   09/09/2007 Normal TDaP:  08/2006  Screening Labs: PCP, Urine today: PCP   reports that she has never smoked. She has never used smokeless tobacco. She reports that she does not drink alcohol or use illicit drugs.  Past Medical History  Diagnosis Date  . Nephrolithiasis 4/07  . GERD (gastroesophageal reflux disease)   . Diabetes mellitus ([place])   . Obesity   . OSA on CPAP   . Hemorrhoids   . Complex endometrial hyperplasia with atypia 12/12    Most recent endometrial bx was 10/2012-NEG  . Cellulitis 5/12    Left (Since replacemnt of Left knee  . Encounter for insertion of mirena IUD 5/12  . Fracture, foot 8/14    Hx of left foot stress fracture  . Moderate persistent asthma     cough-variant  . Hypothyroidism   . History of adenomatous polyp of colon 02/25/13    5 mm cecal polyp removed by Dr. Trevor Mace 5 yrs  . Hyperlipidemia     Not on statin b/c lipids "stayed down when sugars came down" per pt.  She says Dr. Chalmers Cater knows she is not on statin anymore.  . Zoon's vulvitis     Bx-proven (GYN) -lichen sclerosis  . Anemia   . Arthritis   . Hypertension    . History of blood transfusion   . UTI (lower urinary tract infection)   . Cough variant asthma   . Sciatica of left side     Past Surgical History  Procedure Laterality Date  . Ankle fracture surgery  1984    Pin & repair  . Carpal tunnel release Right [id number]    1989 Left  . Tubal ligation  1985    C-Section  . Cholecystectomy  1988  . Arthroscopic repair acl  2000    Due to ACL tear  . Cardiac catheterization  5/07    clear vessel mild mitral   . Arthroscopic repair acl  5/08  . Dilation and curettage of uterus  12/11  . Combined hysteroscopy diagnostic / d&c  2/12    Bx neg  . Replacement total knee Left 5/12    X 2 on each  . Blateral knee rerlacements x4 2 times each knee    . Colonoscopy N/A 02/25/2013    + polypectomy x 1: Procedure: COLONOSCOPY;  Surgeon: Juanita Craver, MD;  Location: [facility];  Service: Endoscopy;  Laterality: N/A;  . Breast biopsy Right 2014    Current Outpatient Prescriptions  Medication Sig Dispense  Refill  . albuterol (PROAIR HFA) 108 (90 BASE) MCG/ACT inhaler Inhale 1-2 puffs into the lungs every 6 (six) hours as needed for wheezing or shortness of breath. 3 Inhaler 1  . aspirin 81 MG tablet Take 81 mg by mouth daily.    . clobetasol ointment (TEMOVATE) 0.05 % Apply topically 2 (two) times daily. Use for no more than 7 days at a time. 60 g 0  . enalapril (VASOTEC) 10 MG tablet Take 10 mg by mouth daily.    . Fluticasone-Salmeterol (ADVAIR DISKUS) 250-50 MCG/DOSE AEPB Inhale 1 puff into the lungs 2 (two) times daily. 180 each 1  . HYDROcodone-homatropine (HYCODAN) 5-1.5 MG/5ML syrup Take 5 mLs by mouth 2 (two) times daily as needed for cough. 120 mL 0  . insulin aspart (NOVOLOG FLEXPEN) 100 UNIT/ML FlexPen Inject into the skin. Sliding scale: avg 8 U with each meal    . Insulin NPH Human, Isophane, (NOVOLIN N [place]) Inject 55 Units into the skin 3 (three) times daily.     Marland Kitchen levonorgestrel (MIRENA) 20 MCG/24HR IUD 1 each by Intrauterine route  once.    Marland Kitchen levothyroxine (SYNTHROID, LEVOTHROID) 125 MCG tablet Take 125 mcg by mouth daily before breakfast.    . metFORMIN (GLUCOPHAGE) 500 MG tablet 1 tab po qAM and 2 tabs po qhs 270 tablet 1   No current facility-administered medications for this visit.    Family History  Problem Relation Age of Onset  . Other Son     VSD/pulmonary atresia  . Diabetes Father   . COPD Father   . Stroke Father   . Rheum arthritis Brother   . Thyroid disease Brother   . Diabetes Brother   . Arthritis Mother   . Alzheimer's disease Mother     ROS:  Pertinent items are noted in HPI.  Otherwise, a comprehensive ROS was negative.  Exam:   BP 140/66 mmHg  Pulse 98  Resp 18  Ht 5' 0.5" (1.537 m)  Wt 284 lb (128.822 kg)  BMI 54.53 kg/m2  LMP 04/11/2010  Weight change: +9#    Height: 5' 0.5" (153.7 cm)  Ht Readings from Last 3 Encounters:  07/02/15 5' 0.5" (1.537 m)  06/29/15 5' 0.75" (1.543 m)  04/28/15 5' 0.75" (1.543 m)    General appearance: alert, cooperative and appears stated age Head: Normocephalic, without obvious abnormality, atraumatic Neck: no adenopathy, supple, symmetrical, trachea midline and thyroid normal to inspection and palpation Lungs: clear to auscultation bilaterally Breasts: normal appearance, no masses or tenderness Heart: regular rate and rhythm Abdomen: soft, non-tender; bowel sounds normal; no masses,  no organomegaly Extremities: extremities normal, atraumatic, no cyanosis or edema Skin: Skin color, texture, turgor normal. No rashes or lesions Lymph nodes: Cervical, supraclavicular, and axillary nodes normal. No abnormal inguinal nodes palpated Neurologic: Grossly normal   Pelvic: External genitalia:  Several linear area of white tissue, slightly raised.  Does not wipe off.  Recommend vulvar biopsy today             Urethra:  normal appearing urethra with no masses, tenderness or lesions              Bartholins and Skenes: normal                 Vagina:  normal appearing vagina with normal color and discharge, no lesions              Cervix: no lesions.  IUD strings noted.  Grasped with ringed forceps and  removed with one pull.  Pt tolerated removal well.              Pap taken: Yes.   Bimanual Exam:  Uterus:  normal size, contour, position, consistency, mobility, non-tender              Adnexa: normal adnexa and no mass, fullness, tenderness               Rectovaginal: Confirms               Anus:  normal sphincter tone, no lesions Physical Exam  Genitourinary:      Vulvar biopsy:  Area on left inferior vulvar cleansed with Betadine x 3.  Area instilled with 1% lidocaine, plain.  64mm punch biopsy obtained.  Silver nitrated used for excellent hemostasis.  No dressing applied.  Pt tolerated procedure well.    Chaperone was present for exam.    A: Well Woman with normal exam Morbid obesity Diabetes H/O Zoon's vulvitis, biopsy proven, with white skin changes on vulva OSA Elevated lipids GERD History of complex endometrial hyperplasia without atypia, now,with Mirena IUD. No vaginal bleeding in several years. Negative biopsies in 10/12, 5/13, 7/14.  S/p Mirena removal today Hypothyroidism  P: Mammogram yearly pap smear obtained. Neg HR HPV 2013. Vulvar biopsy pending.  Results will be called to pt.  Rx for clobetasol 0.05% ointment bid for up to 7 days.  #[id number]. Mirena removal today.  Pt aware to call with any new bleeding. return annually or prn

## 2015-07-06 LAB — IPS PAP TEST WITH REFLEX TO HPV

## 2015-07-10 DIAGNOSIS — E039 Hypothyroidism, unspecified: Secondary | ICD-10-CM | POA: Insufficient documentation

## 2015-07-10 DIAGNOSIS — G4733 Obstructive sleep apnea (adult) (pediatric): Secondary | ICD-10-CM | POA: Insufficient documentation

## 2015-07-10 DIAGNOSIS — N762 Acute vulvitis: Secondary | ICD-10-CM | POA: Insufficient documentation

## 2015-07-10 DIAGNOSIS — N763 Subacute and chronic vulvitis: Secondary | ICD-10-CM | POA: Insufficient documentation

## 2015-07-13 ENCOUNTER — Telehealth: Payer: Self-pay | Admitting: Emergency Medicine

## 2015-07-13 NOTE — Telephone Encounter (Signed)
-----   Message from Megan Salon, MD sent at 07/12/2015  5:56 PM EDT ----- Please inform pt that her pap was normal.  02 recall.  Vulvar biopsy showed chronic inflammation but no abnormal cells.  (Pt has morbid obesity so this makes sense).  She does have steroid ointment to use with irritation and it is ok to continue to use it prn.    CC:  [name]

## 2015-07-21 ENCOUNTER — Other Ambulatory Visit: Payer: Self-pay | Admitting: *Deleted

## 2015-07-21 MED ORDER — INSULIN NPH (HUMAN) (ISOPHANE) 100 UNIT/ML ~~LOC~~ SUSP: 55.0000 [IU] | Freq: Three times a day (TID) | SUBCUTANEOUS | Status: DC

## 2015-07-21 NOTE — Telephone Encounter (Signed)
Pt LMOM on 07/21/15 at 10:56am requesting refill  RF request for Novolin N LOV: 06/29/15 Next ov: 09/21/15 Last written: unknown  Rx sent. Pt advised and voiced understanding.

## 2015-07-29 ENCOUNTER — Telehealth: Payer: Self-pay | Admitting: *Deleted

## 2015-07-29 MED ORDER — INSULIN NPH (HUMAN) (ISOPHANE) 100 UNIT/ML ~~LOC~~ SUSP: 55.0000 [IU] | Freq: Three times a day (TID) | SUBCUTANEOUS | Status: DC

## 2015-07-29 NOTE — Telephone Encounter (Signed)
Pt LMOM on 07/28/15 at 1:31pm requesting 90 day supply for her Novolin N. Rx was just sent on 07/21/15. New Rx for 90 day supply sent today.

## 2015-07-29 NOTE — Telephone Encounter (Signed)
Pt advised and voiced understanding.   

## 2015-07-29 NOTE — Telephone Encounter (Signed)
Notes Recorded by Huey Romans, RN on 07/13/2015 at 4:19 PM Call to patient. Notified of normal pap result and vulvar biopsy result showing chronic inflammation but no abnormal cells. Advised per Dr Sabra Heck to continue to use steroid ointment only as needed. Patient verbalized understanding.

## 2015-09-21 ENCOUNTER — Ambulatory Visit: Payer: BLUE CROSS/BLUE SHIELD | Admitting: Family Medicine

## 2015-09-24 ENCOUNTER — Other Ambulatory Visit: Payer: Self-pay | Admitting: *Deleted

## 2015-09-24 MED ORDER — INSULIN ASPART 100 UNIT/ML FLEXPEN: PEN_INJECTOR | SUBCUTANEOUS | Status: DC

## 2015-09-24 MED ORDER — ALBUTEROL SULFATE HFA 108 (90 BASE) MCG/ACT IN AERS: 1.0000 | INHALATION_SPRAY | Freq: Four times a day (QID) | RESPIRATORY_TRACT | Status: DC | PRN

## 2015-09-24 NOTE — Telephone Encounter (Signed)
RF request for novolog LOV: 07/09/15 Next ov: 11/30/15 Last written: unknown  RF request for proair Last written: 01/19/15 #3 w/ 1

## 2015-10-10 DIAGNOSIS — B029 Zoster without complications: Secondary | ICD-10-CM

## 2015-10-10 HISTORY — DX: Zoster without complications: B02.9

## 2015-10-20 ENCOUNTER — Encounter: Payer: Self-pay | Admitting: Family Medicine

## 2015-10-20 ENCOUNTER — Telehealth: Payer: Self-pay | Admitting: Family Medicine

## 2015-10-20 ENCOUNTER — Ambulatory Visit (INDEPENDENT_AMBULATORY_CARE_PROVIDER_SITE_OTHER): Payer: BLUE CROSS/BLUE SHIELD | Admitting: Family Medicine

## 2015-10-20 VITALS — BP 152/79 | HR 88 | Temp 97.5°F | Resp 20 | Wt 289.8 lb

## 2015-10-20 DIAGNOSIS — H9202 Otalgia, left ear: Secondary | ICD-10-CM | POA: Diagnosis not present

## 2015-10-20 DIAGNOSIS — B029 Zoster without complications: Secondary | ICD-10-CM | POA: Diagnosis not present

## 2015-10-20 MED ORDER — PREDNISONE 20 MG PO TABS: ORAL_TABLET | ORAL | Status: DC

## 2015-10-20 MED ORDER — ACETAMINOPHEN-CODEINE #3 300-30 MG PO TABS: 1.0000 | ORAL_TABLET | Freq: Three times a day (TID) | ORAL | Status: DC | PRN

## 2015-10-20 MED ORDER — VALACYCLOVIR HCL 1 G PO TABS: 1000.0000 mg | ORAL_TABLET | Freq: Three times a day (TID) | ORAL | Status: AC

## 2015-10-20 NOTE — Telephone Encounter (Signed)
Please call pt: - I have decided to add a 10 d course of prednisone taper to her treatment. It is not ideal since she is a diabetic, but I feel it will be helpful long term since she has "ear pain" and a rather extensive shingles outbreak.

## 2015-10-20 NOTE — Progress Notes (Signed)
Patient ID: [name], female   DOB: 08/12/1950, 65 y.o.   MRN: [id number]    [name] , Jul 13, 1950, 65 y.o., female MRN: [id number] Patient Care Team    Relationship Specialty Notifications Start End  Tammi Sou, MD PCP - General Family Medicine  09/19/14   Jacelyn Pi, MD Consulting Physician Endocrinology  09/19/14     CC: rash Subjective: Pt presents for an acute OV with complaints of rash  of 3 day  duration.  Associated symptoms include pain lower back, left ear pain and itching. She is a diabetic. She has not had the zostavax. She states she felt skin sensation/soreness start about a week ago, before the rash appeared. She had pulled a muscle in her back (different location) and her daughter was applying icy/hot to that area and daily monitored the area of skin sensation. Sh estates her daughter stated it started to become red, then purplish blisters started to form. The rash starts mid-back of her lumbar spine and wraps to hip. She denies fever, chills, nausea, vomit, headache or fatigue. She denies hearing loss, ringing in the ears or rash around ear. She states she keeps feeling a sharp intermittent pain in her left ear and she does not know if it is associated with the rash or not. She has used the icy/hot prior without reaction and it was not used over area of rash.   Allergies  Allergen Reactions  . Adhesive [Tape]   . Banana     Nausea and diarrhea  . Eggs Or Egg-Derived Products     Nausea and diarrhea  . Latex     sensativity  . Penicillins Rash  . Pine Swelling and Rash  . Rose Swelling and Rash   Social History  Substance Use Topics  . Smoking status: Never Smoker   . Smokeless tobacco: Never Used  . Alcohol Use: No   Past Medical History  Diagnosis Date  . Nephrolithiasis 4/07  . GERD (gastroesophageal reflux disease)   . Diabetes mellitus ([place])   . Obesity   . OSA on CPAP   . Hemorrhoids   . Complex endometrial hyperplasia with atypia 12/12      Most recent endometrial bx was 10/2012-NEG  . Cellulitis 5/12    Left (Since replacemnt of Left knee  . Encounter for insertion of mirena IUD 5/12  . Fracture, foot 8/14    Hx of left foot stress fracture  . Moderate persistent asthma     cough-variant  . Hypothyroidism   . History of adenomatous polyp of colon 02/25/13    5 mm cecal polyp removed by Dr. Trevor Mace 5 yrs  . Hyperlipidemia     Not on statin b/c lipids "stayed down when sugars came down" per pt.  She says Dr. Chalmers Cater knows she is not on statin anymore.  . Zoon's vulvitis     Bx-proven (GYN) -lichen sclerosis  . Anemia   . Arthritis   . Hypertension   . History of blood transfusion   . UTI (lower urinary tract infection)   . Cough variant asthma   . Sciatica of left side    Past Surgical History  Procedure Laterality Date  . Ankle fracture surgery  1984    Pin & repair  . Carpal tunnel release Right [id number]    1989 Left  . Tubal ligation  1985    C-Section  . Cholecystectomy  1988  . Arthroscopic repair acl  2000    Due  to ACL tear  . Cardiac catheterization  5/07    clear vessel mild mitral   . Arthroscopic repair acl  5/08  . Dilation and curettage of uterus  12/11  . Combined hysteroscopy diagnostic / d&c  2/12    Bx neg  . Replacement total knee Left 5/12    X 2 on each  . Blateral knee rerlacements x4 2 times each knee    . Colonoscopy N/A 02/25/2013    + polypectomy x 1: Procedure: COLONOSCOPY;  Surgeon: Juanita Craver, MD;  Location: [facility];  Service: Endoscopy;  Laterality: N/A;  . Breast biopsy Right 2014   Family History  Problem Relation Age of Onset  . Other Son     VSD/pulmonary atresia  . Diabetes Father   . COPD Father   . Stroke Father   . Rheum arthritis Brother   . Thyroid disease Brother   . Diabetes Brother   . Arthritis Mother   . Alzheimer's disease Mother      Medication List       This list is accurate as of: 10/20/15  2:35 PM.  Always use your most recent med  list.               albuterol 108 (90 Base) MCG/ACT inhaler  Commonly known as:  PROAIR HFA  Inhale 1-2 puffs into the lungs every 6 (six) hours as needed for wheezing or shortness of breath.     aspirin 81 MG tablet  Take 81 mg by mouth daily.     clobetasol ointment 0.05 %  Commonly known as:  TEMOVATE  Apply topically 2 (two) times daily.     enalapril 10 MG tablet  Commonly known as:  VASOTEC  Take 10 mg by mouth daily.     Fluticasone-Salmeterol 250-50 MCG/DOSE Aepb  Commonly known as:  ADVAIR DISKUS  Inhale 1 puff into the lungs 2 (two) times daily.     insulin aspart 100 UNIT/ML FlexPen  Commonly known as:  NOVOLOG FLEXPEN  Sliding scale: avg 8 U with each meal     insulin NPH Human 100 UNIT/ML injection  Commonly known as:  NOVOLIN N  Inject 0.55 mLs (55 Units total) into the skin 3 (three) times daily.     levothyroxine 125 MCG tablet  Commonly known as:  SYNTHROID, LEVOTHROID  Take 125 mcg by mouth daily before breakfast.     metFORMIN 500 MG tablet  Commonly known as:  GLUCOPHAGE  1 tab po qAM and 2 tabs po qhs        No results found for this or any previous visit (from the past 24 hour(s)). No results found.   ROS: Negative, with the exception of above mentioned in HPI   Objective:  BP 152/79 mmHg  Pulse 88  Temp(Src) 97.5 F (36.4 C)  Resp 20  Wt 289 lb 12.8 oz (131.452 kg)  SpO2 94%  LMP 04/11/2010 Body mass index is 55.64 kg/(m^2). Gen: Afebrile. No acute distress. Nontoxic in appearance, well developed, well nourished. Obese. HENT: AT. [place]. Bilateral TM visualized normal in appearance bilaterally, normal EAM. MMM, no oral lesions.  Eyes:Pupils Equal Round Reactive to light, Extraocular movements intact,  Conjunctiva without redness, discharge or icterus. Skin: erythremic rash with blister formation midline lumbar (L4/5 area) in a linear fashion along left side to lateral hip/inner thigh. No superficial drainage/infection.  Neuro: Normal  gait. PERLA. EOMi. Alert. Oriented x3  Psych: Normal affect, dress and demeanor. Normal speech. Normal  thought content and judgment.  Assessment/Plan: [name] is a 65 y.o. female present for acute OV for  Shingles outbreak - antiviral treatment.  valACYclovir (VALTREX) 1000 MG tablet; Take 1 tablet (1,000 mg total) by mouth 3 (three) times daily.  Dispense: 30 tablet; Refill: 0 - Pain: Short course PRN. acetaminophen-codeine (TYLENOL #3) 300-30 MG tablet; Take 1-2 tablets by mouth every 8 (eight) hours as needed for moderate pain.  Dispense: 60 tablet; Refill: 0 - Consider topical therapy (advised OTC lidocaine gel/patch/cream etc) - 10 d taper of prednisone. Mild concern over ear "pain" although normal exam and not the location of shingles outbreak. She is a diabetic, steroids not ideal, however will benefit long term from coverage.  - AVS shingles.  - F/U 2 weeks if not resolved or sooner if worsening.    electronically signed by:  Howard Pouch, DO  [place]

## 2015-10-20 NOTE — Patient Instructions (Signed)

## 2015-10-20 NOTE — Telephone Encounter (Signed)
Spoke with patient reviewed  instructions. Patient verbalized understanding. 

## 2015-11-02 ENCOUNTER — Encounter: Payer: Self-pay | Admitting: Family Medicine

## 2015-11-19 ENCOUNTER — Other Ambulatory Visit: Payer: Self-pay | Admitting: *Deleted

## 2015-11-19 MED ORDER — ENALAPRIL MALEATE 10 MG PO TABS: 10.0000 mg | ORAL_TABLET | Freq: Every day | ORAL | 3 refills | Status: DC

## 2015-11-19 NOTE — Telephone Encounter (Signed)
Rx sent.  Pt advised and voiced understanding.   

## 2015-11-19 NOTE — Telephone Encounter (Signed)
Pt LMOM on 11/19/15 at 12:47pm requesting refill for:   RF request for enalapril LOV: 06/29/15 Next ov: 11/30/15 Last written: unknown

## 2015-11-30 ENCOUNTER — Ambulatory Visit: Payer: BLUE CROSS/BLUE SHIELD | Admitting: Family Medicine

## 2015-12-09 ENCOUNTER — Other Ambulatory Visit: Payer: Self-pay

## 2015-12-09 DIAGNOSIS — J441 Chronic obstructive pulmonary disease with (acute) exacerbation: Secondary | ICD-10-CM

## 2015-12-09 DIAGNOSIS — J45901 Unspecified asthma with (acute) exacerbation: Principal | ICD-10-CM

## 2015-12-09 MED ORDER — FLUTICASONE-SALMETEROL 250-50 MCG/DOSE IN AEPB: 1.0000 | INHALATION_SPRAY | Freq: Two times a day (BID) | RESPIRATORY_TRACT | 1 refills | Status: DC

## 2015-12-09 NOTE — Telephone Encounter (Signed)
Rx refill and was e-perscribed to [facility]

## 2015-12-28 ENCOUNTER — Ambulatory Visit (INDEPENDENT_AMBULATORY_CARE_PROVIDER_SITE_OTHER): Payer: Medicare Other | Admitting: Family Medicine

## 2015-12-28 ENCOUNTER — Encounter: Payer: Self-pay | Admitting: Family Medicine

## 2015-12-28 VITALS — BP 144/78 | HR 79 | Temp 97.9°F | Resp 18 | Wt 288.8 lb

## 2015-12-28 DIAGNOSIS — I10 Essential (primary) hypertension: Secondary | ICD-10-CM

## 2015-12-28 DIAGNOSIS — Z23 Encounter for immunization: Secondary | ICD-10-CM

## 2015-12-28 DIAGNOSIS — Z794 Long term (current) use of insulin: Secondary | ICD-10-CM

## 2015-12-28 DIAGNOSIS — E119 Type 2 diabetes mellitus without complications: Secondary | ICD-10-CM | POA: Diagnosis not present

## 2015-12-28 DIAGNOSIS — E039 Hypothyroidism, unspecified: Secondary | ICD-10-CM | POA: Diagnosis not present

## 2015-12-28 DIAGNOSIS — J4540 Moderate persistent asthma, uncomplicated: Secondary | ICD-10-CM

## 2015-12-28 LAB — COMPREHENSIVE METABOLIC PANEL
ALK PHOS: 55 U/L (ref 39–117)
ALT: 13 U/L (ref 0–35)
AST: 10 U/L (ref 0–37)
Albumin: 3.7 g/dL (ref 3.5–5.2)
BUN: 23 mg/dL (ref 6–23)
CALCIUM: 8.9 mg/dL (ref 8.4–10.5)
CO2: 27 mEq/L (ref 19–32)
CREATININE: 0.85 mg/dL (ref 0.40–1.20)
Chloride: 106 mEq/L (ref 96–112)
GFR: 71.34 mL/min (ref >60.00)
Glucose, Bld: 142 mg/dL — ABNORMAL HIGH (ref 70–99)
POTASSIUM: 4.8 meq/L (ref 3.5–5.1)
SODIUM: 141 meq/L (ref 135–145)
TOTAL PROTEIN: 6.7 g/dL (ref 6.0–8.3)
Total Bilirubin: 0.4 mg/dL (ref 0.2–1.2)

## 2015-12-28 LAB — LIPID PANEL
CHOL/HDL RATIO: 5
Cholesterol: 164 mg/dL (ref 0–200)
HDL: 35.5 mg/dL — ABNORMAL LOW (ref >39.00)
LDL Cholesterol: 104 mg/dL — ABNORMAL HIGH (ref 0–99)
NONHDL: 128.63
Triglycerides: 122 mg/dL (ref 0.0–149.0)
VLDL: 24.4 mg/dL (ref 0.0–40.0)

## 2015-12-28 LAB — TSH: TSH: 1.44 u[IU]/mL (ref 0.35–4.50)

## 2015-12-28 LAB — HEMOGLOBIN A1C: HEMOGLOBIN A1C: 7.4 % — AB (ref 4.6–6.5)

## 2015-12-28 MED ORDER — METFORMIN HCL 500 MG PO TABS: ORAL_TABLET | ORAL | 1 refills | Status: DC

## 2015-12-28 MED ORDER — INSULIN ASPART 100 UNIT/ML ~~LOC~~ SOLN: 10.0000 [IU] | Freq: Three times a day (TID) | SUBCUTANEOUS | 3 refills | Status: DC

## 2015-12-28 MED ORDER — ALBUTEROL SULFATE HFA 108 (90 BASE) MCG/ACT IN AERS: 1.0000 | INHALATION_SPRAY | Freq: Four times a day (QID) | RESPIRATORY_TRACT | 3 refills | Status: DC | PRN

## 2015-12-28 MED ORDER — FLUTICASONE-SALMETEROL 500-50 MCG/DOSE IN AEPB: 1.0000 | INHALATION_SPRAY | Freq: Two times a day (BID) | RESPIRATORY_TRACT | 2 refills | Status: DC

## 2015-12-28 MED ORDER — INSULIN SYRINGES (DISPOSABLE) U-100 0.3 ML MISC: 3 refills | Status: DC

## 2015-12-28 MED ORDER — INSULIN NPH (HUMAN) (ISOPHANE) 100 UNIT/ML ~~LOC~~ SUSP: 55.0000 [IU] | Freq: Three times a day (TID) | SUBCUTANEOUS | 1 refills | Status: DC

## 2015-12-28 MED ORDER — ENALAPRIL MALEATE 10 MG PO TABS: 10.0000 mg | ORAL_TABLET | Freq: Every day | ORAL | 3 refills | Status: DC

## 2015-12-28 NOTE — Progress Notes (Signed)
OFFICE VISIT  12/28/2015   CC:  Chief Complaint  Patient presents with  . Follow-up    diabetes    HPI:    Patient is a 65 y.o. Caucasian female who presents for 6 mo f/u DM 2, HTN, and moderate persistent cough-variant asthma, hypothyroidism.  DM: home glucose monitoring normal except for when she was on prednisone for shingles a couple months ago. HTN: normal home monitoring. Asthma: with allergy season she has had more coughing.  Using alb inhaler 4-6 times per week lately. Compliant with advair.  Taking thyroid med daily.  Takes it on empty stomach w/out other meds.  Past Medical History:  Diagnosis Date  . Anemia   . Arthritis   . Cellulitis 5/12   Left (Since replacemnt of Left knee  . Chronic renal insufficiency, stage II (mild) 2015  . Complex endometrial hyperplasia with atypia 12/12   Most recent endometrial bx was 10/2012-NEG  . Cough variant asthma   . Diabetes mellitus ([place])   . Encounter for insertion of mirena IUD 5/12  . Fracture, foot 8/14   Hx of left foot stress fracture  . GERD (gastroesophageal reflux disease)   . Hemorrhoids   . Herpes zoster 10/2015   L side belt-line  . History of adenomatous polyp of colon 02/25/13   5 mm cecal polyp removed by Dr. Trevor Mace 5 yrs  . History of blood transfusion   . Hyperlipidemia    Not on statin b/c lipids "stayed down when sugars came down" per pt.  She says Dr. Chalmers Cater knows she is not on statin anymore.  . Hypertension   . Hypothyroidism   . Moderate persistent asthma    cough-variant  . Nephrolithiasis 4/07  . Obesity   . OSA on CPAP   . Osteoarthritis of both knees    Bilat TKA  . Sciatica of left side   . UTI (lower urinary tract infection)   . Zoon's vulvitis    Bx-proven (GYN) -lichen sclerosis    Past Surgical History:  Procedure Laterality Date  . ANKLE FRACTURE SURGERY  1984   Pin & repair  . ARTHROSCOPIC REPAIR ACL  2000   Due to ACL tear  . ARTHROSCOPIC REPAIR ACL  5/08  .  Blateral knee rerlacements x4 2 times each knee    . BREAST BIOPSY Right 2014  . CARDIAC CATHETERIZATION  5/07   clear vessel mild mitral   . CARPAL TUNNEL RELEASE Right [id number]   1989 Left  . CHOLECYSTECTOMY  1988  . COLONOSCOPY N/A 02/25/2013   + polypectomy x 1: Procedure: COLONOSCOPY;  Surgeon: Juanita Craver, MD;  Location: [facility];  Service: Endoscopy;  Laterality: N/A;  . COMBINED HYSTEROSCOPY DIAGNOSTIC / D&C  2/12   Bx neg  . DILATION AND CURETTAGE OF UTERUS  12/11  . REPLACEMENT TOTAL KNEE Left 5/12   X 2 on each  . TUBAL LIGATION  1985   C-Section    Outpatient Medications Prior to Visit  Medication Sig Dispense Refill  . acetaminophen-codeine (TYLENOL #3) 300-30 MG tablet Take 1-2 tablets by mouth every 8 (eight) hours as needed for moderate pain. 60 tablet 0  . aspirin 81 MG tablet Take 81 mg by mouth daily.    . clobetasol ointment (TEMOVATE) 0.05 % Apply topically 2 (two) times daily. 60 g 3  . levothyroxine (SYNTHROID, LEVOTHROID) 125 MCG tablet Take 125 mcg by mouth daily before breakfast.    . albuterol (PROAIR HFA) 108 (90 Base) MCG/ACT inhaler  Inhale 1-2 puffs into the lungs every 6 (six) hours as needed for wheezing or shortness of breath. 3 Inhaler 3  . enalapril (VASOTEC) 10 MG tablet Take 1 tablet (10 mg total) by mouth daily. 90 tablet 3  . Fluticasone-Salmeterol (ADVAIR DISKUS) 250-50 MCG/DOSE AEPB Inhale 1 puff into the lungs 2 (two) times daily. 180 each 1  . insulin aspart (NOVOLOG FLEXPEN) 100 UNIT/ML FlexPen Sliding scale: avg 8 U with each meal 45 mL 1  . insulin NPH Human (NOVOLIN N) 100 UNIT/ML injection Inject 0.55 mLs (55 Units total) into the skin 3 (three) times daily. 15 vial 1  . metFORMIN (GLUCOPHAGE) 500 MG tablet 1 tab po qAM and 2 tabs po qhs 270 tablet 1  . predniSONE (DELTASONE) 20 MG tablet 60 mg x3d, 40 mg x3d, 20 mg x2d, 10 mg x2d 18 tablet 0   No facility-administered medications prior to visit.     Allergies  Allergen  Reactions  . Adhesive [Tape]   . Banana     Nausea and diarrhea  . Eggs Or Egg-Derived Products     Nausea and diarrhea  . Latex     sensativity  . Penicillins Rash  . Pine Swelling and Rash  . Rose Swelling and Rash    ROS As per HPI  PE: Blood pressure (!) 144/78, pulse 79, temperature 97.9 F (36.6 C), temperature source Oral, resp. rate 18, weight 288 lb 12.8 oz (131 kg), last menstrual period 04/11/2010, SpO2 96 %. Gen: Alert, well appearing.  Patient is oriented to person, place, time, and situation. AFFECT: pleasant, lucid thought and speech. CV: RRR, distant S1 and S2 due to body habitus.  No audible m/r/g EXT: 2+ pitting edema in both LLs.  LABS:  Lab Results  Component Value Date   TSH 1.21 03/11/2015   Lab Results  Component Value Date   WBC 7.5 08/18/2010   HGB 10.2 (L) 08/18/2010   HCT 30.9 (L) 08/18/2010   MCV 94.5 08/18/2010   PLT 230 08/18/2010   Lab Results  Component Value Date   CREATININE 0.91 04/28/2015   BUN 22 04/28/2015   NA 140 04/28/2015   K 4.2 04/28/2015   CL 101 04/28/2015   CO2 30 04/28/2015   Lab Results  Component Value Date   ALT 20 04/28/2015   AST 17 04/28/2015   ALKPHOS 60 04/28/2015   BILITOT 0.4 04/28/2015   Lab Results  Component Value Date   HGBA1C 7.1 06/29/2015    IMPRESSION AND PLAN:  1) Asthma, moderate persistent (cough-variant): not ideal control. Increase advair to 500/50, 1 p bid.  Continue albut q4h prn.  2) DM 2: good control historically. HbA1c today. Per pt's request, we'll change her novolog from flexpen to vials to see if she has cost savings. Flu vaccine given today. Check FLP today.  3) HTN: The current medical regimen is effective;  continue present plan and medications. Lytes/cr today.  4) Hypothyroidism: due for TSH monitoring today.  An After Visit Summary was printed and given to the patient.  FOLLOW UP: Return in about 3 months (around 03/28/2016) for routine chronic illness  f/u.  Signed:  Crissie Sickles, MD           12/28/2015

## 2015-12-28 NOTE — Progress Notes (Signed)
Pre visit review using our clinic review tool, if applicable. No additional management support is needed unless otherwise documented below in the visit note. 

## 2016-01-29 ENCOUNTER — Other Ambulatory Visit: Payer: Self-pay | Admitting: Family Medicine

## 2016-02-17 ENCOUNTER — Other Ambulatory Visit: Payer: Self-pay | Admitting: Family Medicine

## 2016-03-29 ENCOUNTER — Ambulatory Visit: Payer: Medicare Other | Admitting: Family Medicine

## 2016-04-12 ENCOUNTER — Ambulatory Visit (INDEPENDENT_AMBULATORY_CARE_PROVIDER_SITE_OTHER): Payer: Medicare Other | Admitting: Family Medicine

## 2016-04-12 ENCOUNTER — Encounter: Payer: Self-pay | Admitting: Family Medicine

## 2016-04-12 VITALS — BP 159/82 | HR 77 | Temp 98.2°F | Resp 20 | Wt 287.8 lb

## 2016-04-12 DIAGNOSIS — R062 Wheezing: Secondary | ICD-10-CM | POA: Diagnosis not present

## 2016-04-12 DIAGNOSIS — J0100 Acute maxillary sinusitis, unspecified: Secondary | ICD-10-CM

## 2016-04-12 DIAGNOSIS — J45901 Unspecified asthma with (acute) exacerbation: Secondary | ICD-10-CM

## 2016-04-12 MED ORDER — IPRATROPIUM-ALBUTEROL 0.5-2.5 (3) MG/3ML IN SOLN
3.0000 mL | Freq: Once | RESPIRATORY_TRACT | Status: AC
  Administered 2016-04-12: 3 mL via RESPIRATORY_TRACT

## 2016-04-12 MED ORDER — PREDNISONE 50 MG PO TABS: 50.0000 mg | ORAL_TABLET | Freq: Every day | ORAL | 0 refills | Status: DC

## 2016-04-12 MED ORDER — HYDROCODONE-HOMATROPINE 5-1.5 MG/5ML PO SYRP: 5.0000 mL | ORAL_SOLUTION | Freq: Every evening | ORAL | 0 refills | Status: DC | PRN

## 2016-04-12 MED ORDER — DOXYCYCLINE HYCLATE 100 MG PO TABS: 100.0000 mg | ORAL_TABLET | Freq: Two times a day (BID) | ORAL | 0 refills | Status: DC

## 2016-04-12 NOTE — Patient Instructions (Addendum)
You can use flonase and plain  Mucinex DM.  Doxycyline called in for every 12 hours for 10 days.  Prednisone burst for 5 days only.  Hycodan cough syrup at night if needed. No refills.   Rest and hydrate.    Sinus infection, with asthma flare.

## 2016-04-12 NOTE — Progress Notes (Signed)
[name] , January 10, 1951, 66 y.o., female MRN: [id number] Patient Care Team    Relationship Specialty Notifications Start End  Tammi Sou, MD PCP - General Family Medicine  09/19/14   Jacelyn Pi, MD Consulting Physician Endocrinology  09/19/14     CC: congestion Subjective: Pt presents for an acute OV with complaints of Congestion of 5 days  duration.  Associated symptoms include wheezing, cough, congestion, shortness of breath, drainage and chills. Pt feels symptoms are progressively becoming worse. She has been using her albuterol inhaler frequently.Pt has tried Mucinex a leftover codeine cough syrup to ease their symptoms. Patient has a history of asthma. He reports compliance with Advair.    Allergies  Allergen Reactions  . Adhesive [Tape]   . Banana     Nausea and diarrhea  . Eggs Or Egg-Derived Products     Nausea and diarrhea  . Latex     sensativity  . Penicillins Rash  . Pine Swelling and Rash  . Rose Swelling and Rash   Social History  Substance Use Topics  . Smoking status: Never Smoker  . Smokeless tobacco: Never Used  . Alcohol use No   Past Medical History:  Diagnosis Date  . Anemia   . Arthritis   . Cellulitis 5/12   Left (Since replacemnt of Left knee  . Chronic renal insufficiency, stage II (mild) 2015  . Complex endometrial hyperplasia with atypia 12/12   Most recent endometrial bx was 10/2012-NEG  . Cough variant asthma   . Diabetes mellitus ([place])   . Encounter for insertion of mirena IUD 5/12  . Fracture, foot 8/14   Hx of left foot stress fracture  . GERD (gastroesophageal reflux disease)   . Hemorrhoids   . Herpes zoster 10/2015   L side belt-line  . History of adenomatous polyp of colon 02/25/13   5 mm cecal polyp removed by Dr. Trevor Mace 5 yrs  . History of blood transfusion   . Hyperlipidemia    Not on statin b/c lipids "stayed down when sugars came down" per pt.  She says Dr. Chalmers Cater knows she is not on statin anymore.  .  Hypertension   . Hypothyroidism   . Moderate persistent asthma    cough-variant  . Nephrolithiasis 4/07  . Obesity   . OSA on CPAP   . Osteoarthritis of both knees    Bilat TKA  . Sciatica of left side   . UTI (lower urinary tract infection)   . Zoon's vulvitis    Bx-proven (GYN) -lichen sclerosis   Past Surgical History:  Procedure Laterality Date  . ANKLE FRACTURE SURGERY  1984   Pin & repair  . ARTHROSCOPIC REPAIR ACL  2000   Due to ACL tear  . ARTHROSCOPIC REPAIR ACL  5/08  . Blateral knee rerlacements x4 2 times each knee    . BREAST BIOPSY Right 2014  . CARDIAC CATHETERIZATION  5/07   clear vessel mild mitral   . CARPAL TUNNEL RELEASE Right [id number]   1989 Left  . CHOLECYSTECTOMY  1988  . COLONOSCOPY N/A 02/25/2013   + polypectomy x 1: Procedure: COLONOSCOPY;  Surgeon: Juanita Craver, MD;  Location: [facility];  Service: Endoscopy;  Laterality: N/A;  . COMBINED HYSTEROSCOPY DIAGNOSTIC / D&C  2/12   Bx neg  . DILATION AND CURETTAGE OF UTERUS  12/11  . REPLACEMENT TOTAL KNEE Left 5/12   X 2 on each  . TUBAL LIGATION  1985   C-Section  Family History  Problem Relation Age of Onset  . Diabetes Father   . COPD Father   . Stroke Father   . Rheum arthritis Brother   . Thyroid disease Brother   . Diabetes Brother   . Arthritis Mother   . Alzheimer's disease Mother   . Other Son     VSD/pulmonary atresia   Allergies as of 04/12/2016      Reactions   Adhesive [tape]    Banana    Nausea and diarrhea   Eggs Or Egg-derived Products    Nausea and diarrhea   Latex    sensativity   Penicillins Rash   Pine Swelling, Rash   Rose Swelling, Rash      Medication List       Accurate as of 04/12/16  2:51 PM. Always use your most recent med list.          acetaminophen-codeine 300-30 MG tablet Commonly known as:  TYLENOL #3 Take 1-2 tablets by mouth every 8 (eight) hours as needed for moderate pain.   albuterol 108 (90 Base) MCG/ACT inhaler Commonly known as:   PROAIR HFA Inhale 1-2 puffs into the lungs every 6 (six) hours as needed for wheezing or shortness of breath.   aspirin 81 MG tablet Take 81 mg by mouth daily.   clobetasol ointment 0.05 % Commonly known as:  TEMOVATE Apply topically 2 (two) times daily.   enalapril 10 MG tablet Commonly known as:  VASOTEC Take 1 tablet (10 mg total) by mouth daily.   Fluticasone-Salmeterol 500-50 MCG/DOSE Aepb Commonly known as:  ADVAIR Inhale 1 puff into the lungs 2 (two) times daily.   insulin NPH Human 100 UNIT/ML injection Commonly known as:  NOVOLIN N Inject 0.55 mLs (55 Units total) into the skin 3 (three) times daily.   NOVOLIN N 100 UNIT/ML injection Generic drug:  insulin NPH Human INJECT 0.55 MLS (55 UNITS TOTAL) INTO THE SKIN 3 (THREE) TIMES DAILY.   Insulin Syringes (Disposable) U-100 0.3 ML Misc Use to inject insulin--6 injections per day   levothyroxine 125 MCG tablet Commonly known as:  SYNTHROID, LEVOTHROID TAKE 1 TABLET BY MOUTH EVERY MORNING ON AN EMPTY STOMACH   metFORMIN 500 MG tablet Commonly known as:  GLUCOPHAGE 1 tab po qAM and 2 tabs po qhs   NOVOLOG FLEXPEN 100 UNIT/ML FlexPen Generic drug:  insulin aspart       No results found for this or any previous visit (from the past 24 hour(s)). No results found.   ROS: Negative, with the exception of above mentioned in HPI   Objective:  BP (!) 159/82 (BP Location: Left Arm, Patient Position: Sitting, Cuff Size: Large)   Pulse 77   Temp 98.2 F (36.8 C)   Resp 20   Wt 287 lb 12 oz (130.5 kg)   LMP 04/11/2010   SpO2 95%   BMI 55.27 kg/m  Body mass index is 55.27 kg/m. Gen: Afebrile. No acute distress. Nontoxic in appearance, well developed, well nourished.  HENT: AT. [place]. Bilateral TM visualized Bilateral air fluid levels right greater than left. MMM, no oral lesions. Bilateral nares erythema, swelling and drainage. Throat without erythema or exudates. Cough, hoarseness and tender to palpation maxillary  sinus present. Eyes:Pupils Equal Round Reactive to light, Extraocular movements intact,  Conjunctiva without redness, discharge or icterus. Neck/lymp/endocrine: Supple, no lymphadenopathy CV: RRR  Chest: Mild wheeze, diminished air sounds bilaterally.   Assessment/Plan: [name] is a 66 y.o. female present for acute OV  for  Wheezing Acute maxillary sinusitis, recurrence not specified - Patient with sinusitis, after DuoNeb treatment air movement greatly improved into the lungs. - Rest, hydrate, Flonase, Mucinex, doxycycline and prednisone burst prescribed. Along with Medicaid cough syrup. - Albuterol 1-2 puffs every 6 hours when necessary during illness only. - ipratropium-albuterol (DUONEB) 0.5-2.5 (3) MG/3ML nebulizer solution 3 mL; Take 3 mLs by nebulization once. - doxycycline (VIBRA-TABS) 100 MG tablet; Take 1 tablet (100 mg total) by mouth 2 (two) times daily.  Dispense: 20 tablet; Refill: 0 - predniSONE (DELTASONE) 50 MG tablet; Take 1 tablet (50 mg total) by mouth daily with breakfast.  Dispense: 5 tablet; Refill: 0 - Hycodan  cough syrup prevented, no refills   electronically signed by:  Howard Pouch, DO  [place]

## 2016-05-03 ENCOUNTER — Ambulatory Visit (INDEPENDENT_AMBULATORY_CARE_PROVIDER_SITE_OTHER): Payer: Medicare Other | Admitting: Family Medicine

## 2016-05-03 ENCOUNTER — Encounter: Payer: Self-pay | Admitting: Family Medicine

## 2016-05-03 VITALS — BP 171/76 | HR 79 | Temp 98.1°F | Resp 16 | Wt 290.0 lb

## 2016-05-03 DIAGNOSIS — Z23 Encounter for immunization: Secondary | ICD-10-CM | POA: Diagnosis not present

## 2016-05-03 DIAGNOSIS — J4550 Severe persistent asthma, uncomplicated: Secondary | ICD-10-CM

## 2016-05-03 DIAGNOSIS — E119 Type 2 diabetes mellitus without complications: Secondary | ICD-10-CM | POA: Diagnosis not present

## 2016-05-03 DIAGNOSIS — I10 Essential (primary) hypertension: Secondary | ICD-10-CM

## 2016-05-03 LAB — POCT GLYCOSYLATED HEMOGLOBIN (HGB A1C): Hemoglobin A1C: 8.1

## 2016-05-03 MED ORDER — INSULIN ASPART 100 UNIT/ML ~~LOC~~ SOLN: SUBCUTANEOUS | 11 refills | Status: DC

## 2016-05-03 MED ORDER — PRAVASTATIN SODIUM 20 MG PO TABS: 20.0000 mg | ORAL_TABLET | Freq: Every day | ORAL | 6 refills | Status: DC

## 2016-05-03 NOTE — Progress Notes (Signed)
Pre visit review using our clinic review tool, if applicable. No additional management support is needed unless otherwise documented below in the visit note. 

## 2016-05-03 NOTE — Patient Instructions (Signed)
Increase your novolin N to 57 units three times per day.

## 2016-05-03 NOTE — Progress Notes (Signed)
OFFICE VISIT  05/03/2016   CC:  Chief Complaint  Patient presents with  . Follow-up    asthma and diabetes   HPI:    Patient is a 66 y.o. Caucasian female who presents for 4 mo f/u DM 2, HTN, and asthma. Since I saw her last she came in for sinusitis and an asthma flare.  She has gotten better but still requires albuterol inhaler daily, and there are some days she still has to use rescue inhaler 2-3 times. She has been compliant with advair 500/50 over the last 4 mo.  No fevers. Still lots of nasal drainage/pnd.  Has been taking flonase for the last 3 wks.  DM 2: fastings avg 115 or so until prednisone burst made them go to upper 200s. Takes novolog pen 8-20 units at meals per SS.  Takes novolin N 55 U tid. Rare low sugar.  BP: home monitoring 130s/80.  She has a white coat component to her HTN.  Past Medical History:  Diagnosis Date  . Anemia   . Arthritis   . Cellulitis 5/12   Left (Since replacemnt of Left knee  . Chronic renal insufficiency, stage II (mild) 2015  . Complex endometrial hyperplasia with atypia 12/12   Most recent endometrial bx was 10/2012-NEG  . Cough variant asthma   . Diabetes mellitus ([place])   . Encounter for insertion of mirena IUD 5/12  . Fracture, foot 8/14   Hx of left foot stress fracture  . GERD (gastroesophageal reflux disease)   . Hemorrhoids   . Herpes zoster 10/2015   L side belt-line  . History of adenomatous polyp of colon 02/25/13   5 mm cecal polyp removed by Dr. Trevor Mace 5 yrs  . History of blood transfusion   . Hyperlipidemia    Not on statin b/c lipids "stayed down when sugars came down" per pt.  She says Dr. Chalmers Cater knows she is not on statin anymore.  . Hypertension   . Hypothyroidism   . Moderate persistent asthma    cough-variant  . Nephrolithiasis 4/07  . Obesity   . OSA on CPAP   . Osteoarthritis of both knees    Bilat TKA  . Sciatica of left side   . UTI (lower urinary tract infection)   . Zoon's vulvitis     Bx-proven (GYN) -lichen sclerosis    Past Surgical History:  Procedure Laterality Date  . ANKLE FRACTURE SURGERY  1984   Pin & repair  . ARTHROSCOPIC REPAIR ACL  2000   Due to ACL tear  . ARTHROSCOPIC REPAIR ACL  5/08  . Blateral knee rerlacements x4 2 times each knee    . BREAST BIOPSY Right 2014  . CARDIAC CATHETERIZATION  5/07   clear vessel mild mitral   . CARPAL TUNNEL RELEASE Right [id number]   1989 Left  . CHOLECYSTECTOMY  1988  . COLONOSCOPY N/A 02/25/2013   + polypectomy x 1: Procedure: COLONOSCOPY;  Surgeon: Juanita Craver, MD;  Location: [facility];  Service: Endoscopy;  Laterality: N/A;  . COMBINED HYSTEROSCOPY DIAGNOSTIC / D&C  2/12   Bx neg  . DILATION AND CURETTAGE OF UTERUS  12/11  . REPLACEMENT TOTAL KNEE Left 5/12   X 2 on each  . TUBAL LIGATION  1985   C-Section    Outpatient Medications Prior to Visit  Medication Sig Dispense Refill  . albuterol (PROAIR HFA) 108 (90 Base) MCG/ACT inhaler Inhale 1-2 puffs into the lungs every 6 (six) hours as needed for wheezing  or shortness of breath. 3 Inhaler 3  . aspirin 81 MG tablet Take 81 mg by mouth daily.    . clobetasol ointment (TEMOVATE) 0.05 % Apply topically 2 (two) times daily. 60 g 3  . enalapril (VASOTEC) 10 MG tablet Take 1 tablet (10 mg total) by mouth daily. 90 tablet 3  . Fluticasone-Salmeterol (ADVAIR) 500-50 MCG/DOSE AEPB Inhale 1 puff into the lungs 2 (two) times daily. 180 each 2  . insulin NPH Human (NOVOLIN N) 100 UNIT/ML injection Inject 0.55 mLs (55 Units total) into the skin 3 (three) times daily. 15 vial 1  . Insulin Syringes, Disposable, U-100 0.3 ML MISC Use to inject insulin--6 injections per day 540 each 3  . levothyroxine (SYNTHROID, LEVOTHROID) 125 MCG tablet TAKE 1 TABLET BY MOUTH EVERY MORNING ON AN EMPTY STOMACH 30 tablet 6  . metFORMIN (GLUCOPHAGE) 500 MG tablet 1 tab po qAM and 2 tabs po qhs 270 tablet 1  . NOVOLIN N 100 UNIT/ML injection INJECT 0.55 MLS (55 UNITS TOTAL) INTO THE SKIN 3  (THREE) TIMES DAILY. 150 mL 1  . NOVOLOG FLEXPEN 100 UNIT/ML FlexPen   0  . acetaminophen-codeine (TYLENOL #3) 300-30 MG tablet Take 1-2 tablets by mouth every 8 (eight) hours as needed for moderate pain. (Patient not taking: Reported on 05/03/2016) 60 tablet 0  . HYDROcodone-homatropine (HYCODAN) 5-1.5 MG/5ML syrup Take 5 mLs by mouth at bedtime as needed for cough. (Patient not taking: Reported on 05/03/2016) 60 mL 0  . doxycycline (VIBRA-TABS) 100 MG tablet Take 1 tablet (100 mg total) by mouth 2 (two) times daily. 20 tablet 0  . predniSONE (DELTASONE) 50 MG tablet Take 1 tablet (50 mg total) by mouth daily with breakfast. 5 tablet 0   No facility-administered medications prior to visit.     Allergies  Allergen Reactions  . Adhesive [Tape]   . Banana     Nausea and diarrhea  . Eggs Or Egg-Derived Products     Nausea and diarrhea  . Latex     sensativity  . Penicillins Rash  . Pine Swelling and Rash  . Rose Swelling and Rash    ROS As per HPI  PE: Blood pressure (!) 171/76, pulse 79, temperature 98.1 F (36.7 C), temperature source Oral, resp. rate 16, weight 290 lb (131.5 kg), last menstrual period 08/10/2010, SpO2 95 %. Gen: Alert, well appearing.  Patient is oriented to person, place, time, and situation. [id number]: no injection, icteris, swelling, or exudate.  EOMI, PERRLA. Mouth: lips without lesion/swelling.  Oral mucosa pink and moist. Oropharynx without erythema, exudate, or swelling.  CV: RRR, no m/r/g.   LUNGS: CTA bilat, nonlabored resps, good aeration in all lung fields.   LABS:  Lab Results  Component Value Date   TSH 1.44 12/28/2015   Lab Results  Component Value Date   WBC 7.5 08/18/2010   HGB 10.2 (L) 08/18/2010   HCT 30.9 (L) 08/18/2010   MCV 94.5 08/18/2010   PLT 230 08/18/2010   Lab Results  Component Value Date   CREATININE 0.85 12/28/2015   BUN 23 12/28/2015   NA 141 12/28/2015   K 4.8 12/28/2015   CL 106 12/28/2015   CO2 27 12/28/2015    Lab Results  Component Value Date   ALT 13 12/28/2015   AST 10 12/28/2015   ALKPHOS 55 12/28/2015   BILITOT 0.4 12/28/2015   Lab Results  Component Value Date   CHOL 164 12/28/2015   Lab Results  Component Value Date  HDL 35.50 (L) 12/28/2015   Lab Results  Component Value Date   LDLCALC 104 (H) 12/28/2015   Lab Results  Component Value Date   TRIG 122.0 12/28/2015   Lab Results  Component Value Date   CHOLHDL 5 12/28/2015   Lab Results  Component Value Date   HGBA1C 8.1 05/03/2016   IMPRESSION AND PLAN:  1) DM 2, not ideal control.   POC HbA1c today 8.1%.   Need for systemic steroids for asthma flares have caused some of her hyperglycemia.  Increase novolin N to 57 U tid and continue current SS at meals with novolog VIAL (changed from pen today due to cost). Will start pravastatin 20mg  qd (diabetic > age 20, CV benefits even when lipid numbers are normal).  2) Severe persistent asthma, not well controlled. Upper resp illness recently is still wearing off and some of her increased wheezing can be attributed to this.  However, I have never been able to get her asthma well controlled for any extended period of time.  Will refer to allergy/asthma specialist today for help.  No med changes today. Pneumovax 23 given today.  3) HTN: white coat component.  Home monitoring shows good control on current med regimen.  Cr/lytes good 12/2015.  We'll repeat these at next f/u in 3 mo.  An After Visit Summary was printed and given to the patient.  FOLLOW UP: Return in about 3 months (around 08/01/2016) for routine chronic illness f/u (30 min).  Signed:  Crissie Sickles, MD           05/03/2016

## 2016-05-05 ENCOUNTER — Other Ambulatory Visit: Payer: Self-pay | Admitting: Family Medicine

## 2016-05-05 MED ORDER — ALBUTEROL SULFATE HFA 108 (90 BASE) MCG/ACT IN AERS: 1.0000 | INHALATION_SPRAY | Freq: Four times a day (QID) | RESPIRATORY_TRACT | 3 refills | Status: DC | PRN

## 2016-05-05 NOTE — Telephone Encounter (Signed)
[facility] called stating Pt requested her proair to be sent to them.  RX sent as requested.

## 2016-05-25 DIAGNOSIS — T781XXA Other adverse food reactions, not elsewhere classified, initial encounter: Secondary | ICD-10-CM | POA: Diagnosis not present

## 2016-05-25 DIAGNOSIS — K219 Gastro-esophageal reflux disease without esophagitis: Secondary | ICD-10-CM | POA: Diagnosis not present

## 2016-05-25 DIAGNOSIS — J309 Allergic rhinitis, unspecified: Secondary | ICD-10-CM | POA: Diagnosis not present

## 2016-05-25 DIAGNOSIS — J4550 Severe persistent asthma, uncomplicated: Secondary | ICD-10-CM | POA: Diagnosis not present

## 2016-05-28 ENCOUNTER — Encounter: Payer: Self-pay | Admitting: Family Medicine

## 2016-06-02 ENCOUNTER — Other Ambulatory Visit: Payer: Self-pay | Admitting: Obstetrics & Gynecology

## 2016-06-02 NOTE — Telephone Encounter (Signed)
Medication refill request: clobetasol oint  Last AEX:  07-02-15  Next AEX: 11-03-16  Last MMG (if hormonal medication request): 05-29-15 WNL  Refill authorized: please advise

## 2016-06-09 ENCOUNTER — Other Ambulatory Visit: Payer: Self-pay

## 2016-06-09 MED ORDER — INSULIN ASPART 100 UNIT/ML ~~LOC~~ SOLN: SUBCUTANEOUS | 11 refills | Status: DC

## 2016-06-09 NOTE — Telephone Encounter (Signed)
Novolog refill sent to pharmacy

## 2016-06-27 ENCOUNTER — Other Ambulatory Visit: Payer: Self-pay | Admitting: *Deleted

## 2016-06-27 MED ORDER — METFORMIN HCL 500 MG PO TABS: ORAL_TABLET | ORAL | 1 refills | Status: DC

## 2016-06-27 NOTE — Telephone Encounter (Signed)
[facility] [address] [facility]  RF request for metformin LOV: 05/03/16 Next ov: 08/01/16 Last written: 12/28/15 #[id number] w/ 0PT

## 2016-08-01 ENCOUNTER — Ambulatory Visit: Payer: Medicare Other | Admitting: Family Medicine

## 2016-08-11 ENCOUNTER — Encounter: Payer: Self-pay | Admitting: Family Medicine

## 2016-08-11 ENCOUNTER — Ambulatory Visit (INDEPENDENT_AMBULATORY_CARE_PROVIDER_SITE_OTHER): Payer: Medicare Other | Admitting: Family Medicine

## 2016-08-11 VITALS — BP 146/59 | HR 92 | Temp 101.0°F | Resp 20 | Wt 293.8 lb

## 2016-08-11 DIAGNOSIS — J0100 Acute maxillary sinusitis, unspecified: Secondary | ICD-10-CM | POA: Diagnosis not present

## 2016-08-11 DIAGNOSIS — R5081 Fever presenting with conditions classified elsewhere: Secondary | ICD-10-CM

## 2016-08-11 DIAGNOSIS — J180 Bronchopneumonia, unspecified organism: Secondary | ICD-10-CM

## 2016-08-11 DIAGNOSIS — J4551 Severe persistent asthma with (acute) exacerbation: Secondary | ICD-10-CM | POA: Diagnosis not present

## 2016-08-11 MED ORDER — IPRATROPIUM-ALBUTEROL 0.5-2.5 (3) MG/3ML IN SOLN
3.0000 mL | Freq: Once | RESPIRATORY_TRACT | Status: AC
  Administered 2016-08-11: 3 mL via RESPIRATORY_TRACT

## 2016-08-11 MED ORDER — PREDNISONE 20 MG PO TABS
ORAL_TABLET | ORAL | 0 refills | Status: DC
Start: 2016-08-11 — End: 2016-08-16

## 2016-08-11 MED ORDER — LEVOFLOXACIN 500 MG PO TABS: 500.0000 mg | ORAL_TABLET | Freq: Every day | ORAL | 0 refills | Status: DC

## 2016-08-11 NOTE — Patient Instructions (Signed)
Get otc generic robitussin DM OR Mucinex DM and use as directed on the packaging for cough and congestion. Use otc generic saline nasal spray 2-3 times per day to irrigate/moisturize your nasal passages.   

## 2016-08-11 NOTE — Addendum Note (Signed)
Addended by: Gordy Councilman on: 08/11/2016 11:27 AM   Modules accepted: Orders

## 2016-08-11 NOTE — Progress Notes (Signed)
Pre visit review using our clinic review tool, if applicable. No additional management support is needed unless otherwise documented below in the visit note. 

## 2016-08-11 NOTE — Progress Notes (Signed)
OFFICE VISIT  08/11/2016   CC:  Chief Complaint  Patient presents with  . URI    x 1 week   HPI:    Patient is a 66 y.o. Caucasian female who presents for respiratory symptoms. She has severe persistent asthma and takes symbicort as instructed.  Onset 5d/a of cough, wheezing, some SOB.  Fever onset last night, T 101.3. Has some green runny nose in days prior to starting cough/wheeze. Occ post-tussive emesis.  Appetite down last couple days, trying to hydrate, though. +ST and HA. Most recent albut use was about 5 hours ago.    Past Medical History:  Diagnosis Date  . Allergic rhinitis    Allergy testing: results pending as of 05/28/16 (Dr. Harold Hedge)  . Anemia   . Arthritis   . Cellulitis 5/12   Left (Since replacemnt of Left knee  . Chronic renal insufficiency, stage II (mild) 2015  . Complex endometrial hyperplasia with atypia 12/12   Most recent endometrial bx was 10/2012-NEG  . Cough variant asthma   . Diabetes mellitus ([place])   . Encounter for insertion of mirena IUD 5/12  . Fracture, foot 8/14   Hx of left foot stress fracture  . GERD (gastroesophageal reflux disease)   . Hemorrhoids   . Herpes zoster 10/2015   L side belt-line  . History of adenomatous polyp of colon 02/25/13   5 mm cecal polyp removed by Dr. Trevor Mace 5 yrs  . History of blood transfusion   . Hyperlipidemia    Not on statin b/c lipids "stayed down when sugars came down" per pt.  She says Dr. Chalmers Cater knows she is not on statin anymore.  . Hypertension   . Hypothyroidism   . Nephrolithiasis 4/07  . Obesity   . OSA on CPAP   . Osteoarthritis of both knees    Bilat TKA  . Sciatica of left side   . Severe persistent asthma    cough-variant--saw Allergist 05/25/16 and was switched from max dose advair to symbicort.  Marland Kitchen UTI (lower urinary tract infection)   . Zoon's vulvitis    Bx-proven (GYN) -lichen sclerosis    Past Surgical History:  Procedure Laterality Date  . ANKLE FRACTURE SURGERY   1984   Pin & repair  . ARTHROSCOPIC REPAIR ACL  2000   Due to ACL tear  . ARTHROSCOPIC REPAIR ACL  5/08  . Blateral knee rerlacements x4 2 times each knee    . BREAST BIOPSY Right 2014  . CARDIAC CATHETERIZATION  5/07   clear vessel mild mitral   . CARPAL TUNNEL RELEASE Right [id number]   1989 Left  . CHOLECYSTECTOMY  1988  . COLONOSCOPY N/A 02/25/2013   + polypectomy x 1: Procedure: COLONOSCOPY;  Surgeon: Juanita Craver, MD;  Location: [facility];  Service: Endoscopy;  Laterality: N/A;  . COMBINED HYSTEROSCOPY DIAGNOSTIC / D&C  2/12   Bx neg  . DILATION AND CURETTAGE OF UTERUS  12/11  . REPLACEMENT TOTAL KNEE Left 5/12   X 2 on each  . TUBAL LIGATION  1985   C-Section    Outpatient Medications Prior to Visit  Medication Sig Dispense Refill  . albuterol (PROAIR HFA) 108 (90 Base) MCG/ACT inhaler Inhale 1-2 puffs into the lungs every 6 (six) hours as needed for wheezing or shortness of breath. 3 Inhaler 3  . aspirin 81 MG tablet Take 81 mg by mouth daily.    . clobetasol ointment (TEMOVATE) 0.05 % APPLY TOPICALLY 2 (TWO) TIMES DAILY. USE  FOR NO MORE THAN 7 DAYS AT A TIME. 60 g 0  . enalapril (VASOTEC) 10 MG tablet Take 1 tablet (10 mg total) by mouth daily. 90 tablet 3  . insulin aspart (NOVOLOG) 100 UNIT/ML injection Use per sliding scale at each meal (avg use is 15 units tid) 10 mL 11  . insulin NPH Human (NOVOLIN N) 100 UNIT/ML injection Inject 0.55 mLs (55 Units total) into the skin 3 (three) times daily. 15 vial 1  . Insulin Syringes, Disposable, U-100 0.3 ML MISC Use to inject insulin--6 injections per day 540 each 3  . levothyroxine (SYNTHROID, LEVOTHROID) 125 MCG tablet TAKE 1 TABLET BY MOUTH EVERY MORNING ON AN EMPTY STOMACH 30 tablet 6  . metFORMIN (GLUCOPHAGE) 500 MG tablet 1 tab po qAM and 2 tabs po qhs 270 tablet 1  . pravastatin (PRAVACHOL) 20 MG tablet Take 1 tablet (20 mg total) by mouth daily. 30 tablet 6  . acetaminophen-codeine (TYLENOL #3) 300-30 MG tablet Take 1-2  tablets by mouth every 8 (eight) hours as needed for moderate pain. (Patient not taking: Reported on 05/03/2016) 60 tablet 0  . Fluticasone-Salmeterol (ADVAIR) 500-50 MCG/DOSE AEPB Inhale 1 puff into the lungs 2 (two) times daily. (Patient not taking: Reported on 08/11/2016) 180 each 2  . HYDROcodone-homatropine (HYCODAN) 5-1.5 MG/5ML syrup Take 5 mLs by mouth at bedtime as needed for cough. (Patient not taking: Reported on 05/03/2016) 60 mL 0  . NOVOLIN N 100 UNIT/ML injection INJECT 0.55 MLS (55 UNITS TOTAL) INTO THE SKIN 3 (THREE) TIMES DAILY. (Patient not taking: Reported on 08/11/2016) 150 mL 1   No facility-administered medications prior to visit.     Allergies  Allergen Reactions  . Adhesive [Tape]   . Banana     Nausea and diarrhea  . Eggs Or Egg-Derived Products     Nausea and diarrhea  . Latex     sensativity  . Penicillins Rash  . Pine Swelling and Rash  . Rose Swelling and Rash    ROS As per HPI  PE: Blood pressure (!) 146/59, pulse 92, temperature (!) 101 F (38.3 C), temperature source Temporal, resp. rate 20, weight 293 lb 12 oz (133.2 kg), last menstrual period 08/10/2010, SpO2 93 %. VS: noted--febrile and mildly tachycardic. Gen: alert, NAD, very tired- APPEARING. HEENT: eyes without injection, drainage, or swelling.  Ears: EACs clear, TMs with normal light reflex and landmarks.  Nose: Clear rhinorrhea, with some dried, crusty exudate adherent to mildly injected mucosa.  No purulent d/c.  Diffuse paranasal sinus TTP.  No facial swelling.  Throat and mouth without focal lesion.  No pharyngial swelling, erythema, or exudate.   Neck: supple, no LAD.   LUNGS: CTA bilat on inspiration, no wheezing on exhalation unless she does a forced exp maneuver, nonlabored resps.  Frequent post-exhalation coughing. CV: Regular rhythm, mild tachycardia, no m/r/g. EXT: no c/c SKIN: no rash  LABS:  Lab Results  Component Value Date   TSH 1.44 12/28/2015   Lab Results  Component Value  Date   WBC 7.5 08/18/2010   HGB 10.2 (L) 08/18/2010   HCT 30.9 (L) 08/18/2010   MCV 94.5 08/18/2010   PLT 230 08/18/2010   Lab Results  Component Value Date   CREATININE 0.85 12/28/2015   BUN 23 12/28/2015   NA 141 12/28/2015   K 4.8 12/28/2015   CL 106 12/28/2015   CO2 27 12/28/2015   Lab Results  Component Value Date   ALT 13 12/28/2015   AST  10 12/28/2015   ALKPHOS 55 12/28/2015   BILITOT 0.4 12/28/2015   Lab Results  Component Value Date   CHOL 164 12/28/2015   Lab Results  Component Value Date   HDL 35.50 (L) 12/28/2015   Lab Results  Component Value Date   LDLCALC 104 (H) 12/28/2015   Lab Results  Component Value Date   TRIG 122.0 12/28/2015   Lab Results  Component Value Date   CHOLHDL 5 12/28/2015   Lab Results  Component Value Date   HGBA1C 8.1 05/03/2016   IMPRESSION AND PLAN:  1) Allergic rhinitis: this was likely what started this whole illness off. Continue flonase.  2) Acute sinusitis: levaquin rx'd today. Saline nasal spray encouraged.  3) Bronchopneumonia-likely the source of her fever. Levaquin 500 mg qd x 10d (penicillin allergy).  4) Severe persistent asthma with exacerbation: no hypoxia. Gave albut/atr neb in office today. Prednisone 40mg  qd x 5d, then 20mg  qd x 5d. Continue q4h albuterol at home.  Signs/symptoms to call or return for were reviewed and pt expressed understanding.  An After Visit Summary was printed and given to the patient.  FOLLOW UP: Return in about 5 days (around 08/16/2016) for f/u resp illness.  Signed:  Crissie Sickles, MD           08/11/2016

## 2016-08-16 ENCOUNTER — Ambulatory Visit (INDEPENDENT_AMBULATORY_CARE_PROVIDER_SITE_OTHER): Payer: Medicare Other | Admitting: Family Medicine

## 2016-08-16 ENCOUNTER — Encounter: Payer: Self-pay | Admitting: Family Medicine

## 2016-08-16 VITALS — BP 120/76 | HR 71 | Temp 97.8°F | Resp 18 | Wt 290.0 lb

## 2016-08-16 DIAGNOSIS — J180 Bronchopneumonia, unspecified organism: Secondary | ICD-10-CM

## 2016-08-16 DIAGNOSIS — J4551 Severe persistent asthma with (acute) exacerbation: Secondary | ICD-10-CM

## 2016-08-16 MED ORDER — ALBUTEROL SULFATE HFA 108 (90 BASE) MCG/ACT IN AERS: 2.0000 | INHALATION_SPRAY | Freq: Four times a day (QID) | RESPIRATORY_TRACT | 3 refills | Status: DC | PRN

## 2016-08-16 MED ORDER — PREDNISONE 20 MG PO TABS: ORAL_TABLET | ORAL | 0 refills | Status: DC

## 2016-08-16 NOTE — Progress Notes (Signed)
Pre visit review using our clinic review tool, if applicable. No additional management support is needed unless otherwise documented below in the visit note. 

## 2016-08-16 NOTE — Progress Notes (Addendum)
OFFICE VISIT  08/16/2016   CC:  Chief Complaint  Patient presents with  . Follow-up    Respiratory Illness   HPI:    Patient is a 66 y.o. Caucasian female who presents for 5d f/u bronchopneumonia, sinusitis, and acute asthma exacerbation. Levaquin and 10d prednisone taper were rx'd last visit. Says she is between 25-50 percent better.  Still wheezing, lots of cough--productive. Still lots of PND.  No fever in 2d.  SOB improving some. Fluid intake good.  Starting to increase intake of solids.  Past Medical History:  Diagnosis Date  . Allergic rhinitis    Allergy testing: results pending as of 05/28/16 (Dr. Harold Hedge)  . Anemia   . Arthritis   . Cellulitis 5/12   Left (Since replacemnt of Left knee  . Chronic renal insufficiency, stage II (mild) 2015  . Complex endometrial hyperplasia with atypia 12/12   Most recent endometrial bx was 10/2012-NEG  . Cough variant asthma   . Diabetes mellitus ([place])   . Encounter for insertion of mirena IUD 5/12  . Fracture, foot 8/14   Hx of left foot stress fracture  . GERD (gastroesophageal reflux disease)   . Hemorrhoids   . Herpes zoster 10/2015   L side belt-line  . History of adenomatous polyp of colon 02/25/13   5 mm cecal polyp removed by Dr. Trevor Mace 5 yrs  . History of blood transfusion   . Hyperlipidemia    Not on statin b/c lipids "stayed down when sugars came down" per pt.  She says Dr. Chalmers Cater knows she is not on statin anymore.  . Hypertension   . Hypothyroidism   . Nephrolithiasis 4/07  . Obesity   . OSA on CPAP   . Osteoarthritis of both knees    Bilat TKA  . Sciatica of left side   . Severe persistent asthma    cough-variant--saw Allergist 05/25/16 and was switched from max dose advair to symbicort.  Marland Kitchen UTI (lower urinary tract infection)   . Zoon's vulvitis    Bx-proven (GYN) -lichen sclerosis    Past Surgical History:  Procedure Laterality Date  . ANKLE FRACTURE SURGERY  1984   Pin & repair  . ARTHROSCOPIC  REPAIR ACL  2000   Due to ACL tear  . ARTHROSCOPIC REPAIR ACL  5/08  . Blateral knee rerlacements x4 2 times each knee    . BREAST BIOPSY Right 2014  . CARDIAC CATHETERIZATION  5/07   clear vessel mild mitral   . CARPAL TUNNEL RELEASE Right [id number]   1989 Left  . CHOLECYSTECTOMY  1988  . COLONOSCOPY N/A 02/25/2013   + polypectomy x 1: Procedure: COLONOSCOPY;  Surgeon: Juanita Craver, MD;  Location: [facility];  Service: Endoscopy;  Laterality: N/A;  . COMBINED HYSTEROSCOPY DIAGNOSTIC / D&C  2/12   Bx neg  . DILATION AND CURETTAGE OF UTERUS  12/11  . REPLACEMENT TOTAL KNEE Left 5/12   X 2 on each  . TUBAL LIGATION  1985   C-Section    Outpatient Medications Prior to Visit  Medication Sig Dispense Refill  . aspirin 81 MG tablet Take 81 mg by mouth daily.    . clobetasol ointment (TEMOVATE) 0.05 % APPLY TOPICALLY 2 (TWO) TIMES DAILY. USE FOR NO MORE THAN 7 DAYS AT A TIME. 60 g 0  . enalapril (VASOTEC) 10 MG tablet Take 1 tablet (10 mg total) by mouth daily. 90 tablet 3  . insulin aspart (NOVOLOG) 100 UNIT/ML injection Use per sliding scale at  each meal (avg use is 15 units tid) 10 mL 11  . insulin NPH Human (NOVOLIN N) 100 UNIT/ML injection Inject 0.55 mLs (55 Units total) into the skin 3 (three) times daily. 15 vial 1  . Insulin Syringes, Disposable, U-100 0.3 ML MISC Use to inject insulin--6 injections per day 540 each 3  . levofloxacin (LEVAQUIN) 500 MG tablet Take 1 tablet (500 mg total) by mouth daily. 10 tablet 0  . levothyroxine (SYNTHROID, LEVOTHROID) 125 MCG tablet TAKE 1 TABLET BY MOUTH EVERY MORNING ON AN EMPTY STOMACH 30 tablet 6  . metFORMIN (GLUCOPHAGE) 500 MG tablet 1 tab po qAM and 2 tabs po qhs 270 tablet 1  . montelukast (SINGULAIR) 10 MG tablet Take 1 tablet by mouth daily.    . pantoprazole (PROTONIX) 20 MG tablet Take 1 tablet by mouth daily as needed.    . pravastatin (PRAVACHOL) 20 MG tablet Take 1 tablet (20 mg total) by mouth daily. 30 tablet 6  . SYMBICORT  160-4.5 MCG/ACT inhaler Inhale 2 puffs into the lungs 2 (two) times daily.    Marland Kitchen albuterol (PROAIR HFA) 108 (90 Base) MCG/ACT inhaler Inhale 1-2 puffs into the lungs every 6 (six) hours as needed for wheezing or shortness of breath. 3 Inhaler 3  . predniSONE (DELTASONE) 20 MG tablet 2 tabs po qd x 5d, then 1 tab po qd x 5d 15 tablet 0   No facility-administered medications prior to visit.     Allergies  Allergen Reactions  . Adhesive [Tape]   . Banana     Nausea and diarrhea  . Eggs Or Egg-Derived Products     Nausea and diarrhea  . Latex     sensativity  . Penicillins Rash  . Pine Swelling and Rash  . Rose Swelling and Rash    ROS As per HPI  PE: Blood pressure 120/76, pulse 71, temperature 97.8 F (36.6 C), temperature source Oral, resp. rate 18, weight 290 lb (131.5 kg), last menstrual period 08/10/2010, SpO2 95 %. VS: noted--normal. Gen: alert, NAD, NONTOXIC APPEARING. HEENT: eyes without injection, drainage, or swelling.  Ears: EACs clear, TMs with normal light reflex and landmarks.  Nose: Clear rhinorrhea, with some dried, crusty exudate adherent to mildly injected mucosa.  No purulent d/c.  No paranasal sinus TTP.  No facial swelling.  Throat and mouth without focal lesion.  No pharyngial swelling, erythema, or exudate.   Neck: supple, no LAD.   LUNGS: CTA on inspiration bilat except L basilar insp crackles, nonlabored resps.  She has diffuse end-exp wheezing with mildly diminished airflow on exhalation/mild prolongation of exp phase.  CV: RRR, no m/r/g. EXT: no c/c/e SKIN: no rash  LABS:  none  IMPRESSION AND PLAN:  Bronchopneumonia with acute exacerbation of severe persistent asthma. New L basilar insp crackles: infiltrate vs atelectasis.   Check PA/Lat CXR today. Continue/finish 10 course of levaquin. Needs extended prednisone course: we'll do 5 more days of 40mg  qd, then do 5d of 20mg  qd, then at least 5d of 10mg  qd. Continue rest, fluids, albuterol q4h  prn.  An After Visit Summary was printed and given to the patient.  FOLLOW UP: Return in about 7 days (around 08/23/2016) for f/u resp illness/asthma.  Tammi Sou, MD  ADDENDUM 08/18/16: Discussed results of CXR 08/17/16 with pt on phone today: plan is to give 4 more days of levaquin 500mg  to finish a 14 day course of this med. Also, start 20mg  lasix qd x 3d. Plan on ordering echocardiogram  in near future when pt closer to baseline health.  Tammi Sou MD

## 2016-08-17 ENCOUNTER — Other Ambulatory Visit: Payer: Self-pay | Admitting: Family Medicine

## 2016-08-17 ENCOUNTER — Ambulatory Visit (HOSPITAL_BASED_OUTPATIENT_CLINIC_OR_DEPARTMENT_OTHER)
Admission: RE | Admit: 2016-08-17 | Discharge: 2016-08-17 | Disposition: A | Discharge status: Home / Self Care | Payer: Medicare Other | Source: Ambulatory Visit | Attending: Family Medicine | Admitting: Family Medicine

## 2016-08-17 DIAGNOSIS — J180 Bronchopneumonia, unspecified organism: Secondary | ICD-10-CM | POA: Insufficient documentation

## 2016-08-17 DIAGNOSIS — J4551 Severe persistent asthma with (acute) exacerbation: Secondary | ICD-10-CM | POA: Insufficient documentation

## 2016-08-17 DIAGNOSIS — R0602 Shortness of breath: Secondary | ICD-10-CM | POA: Diagnosis not present

## 2016-08-18 ENCOUNTER — Ambulatory Visit: Payer: Medicare Other | Admitting: Family Medicine

## 2016-08-18 ENCOUNTER — Other Ambulatory Visit: Payer: Self-pay | Admitting: Family Medicine

## 2016-08-18 MED ORDER — LEVOFLOXACIN 500 MG PO TABS
500.0000 mg | ORAL_TABLET | Freq: Every day | ORAL | 0 refills | Status: DC
Start: 2016-08-18 — End: 2016-09-06

## 2016-08-18 MED ORDER — FUROSEMIDE 20 MG PO TABS: ORAL_TABLET | ORAL | 0 refills | Status: DC

## 2016-08-23 ENCOUNTER — Ambulatory Visit (INDEPENDENT_AMBULATORY_CARE_PROVIDER_SITE_OTHER): Payer: Medicare Other | Admitting: Family Medicine

## 2016-08-23 ENCOUNTER — Encounter: Payer: Self-pay | Admitting: Family Medicine

## 2016-08-23 VITALS — BP 142/62 | HR 74 | Temp 98.0°F | Resp 16 | Wt 286.0 lb

## 2016-08-23 DIAGNOSIS — J4551 Severe persistent asthma with (acute) exacerbation: Secondary | ICD-10-CM | POA: Diagnosis not present

## 2016-08-23 DIAGNOSIS — J180 Bronchopneumonia, unspecified organism: Secondary | ICD-10-CM

## 2016-08-23 DIAGNOSIS — I509 Heart failure, unspecified: Secondary | ICD-10-CM

## 2016-08-23 LAB — BASIC METABOLIC PANEL
BUN: 27 mg/dL — AB (ref 6–23)
CALCIUM: 9.3 mg/dL (ref 8.4–10.5)
CHLORIDE: 100 meq/L (ref 96–112)
CO2: 30 meq/L (ref 19–32)
CREATININE: 0.93 mg/dL (ref 0.40–1.20)
GFR: 64.18 mL/min (ref >60.00)
GLUCOSE: 130 mg/dL — AB (ref 70–99)
Potassium: 4.1 mEq/L (ref 3.5–5.1)
Sodium: 139 mEq/L (ref 135–145)

## 2016-08-23 MED ORDER — FUROSEMIDE 20 MG PO TABS: ORAL_TABLET | ORAL | 1 refills | Status: DC

## 2016-08-23 NOTE — Progress Notes (Signed)
OFFICE VISIT  08/23/2016   CC:  Chief Complaint  Patient presents with  . Follow-up    respiratory illness, asthma and fluid from chest xray   HPI:    Patient is a 66 y.o. Caucasian female who presents for 1 week f/u bronchopneumonia with acute exacerbation of severe persistent asthma. She was improved last visit but some abnormal lung sounds prompted a CXR, which showed retrocardiac infiltrate and changes suspicious for mild pulm edema. I then rx'd extended steroid taper again and gave more levaquin to complete a 14 day course, also rx'd lasix 20mg  qd x 3d. She has no known hx of chronic or acute CHF.  Significantly improved SOB, esp after starting lasix. Cough present and less productive and is less intense in severity and frequency. Has only required rescue inhaler once since taking the lasix.  HTN: no home monitoring lately. Did not take bp meds yet today.  ROS: no CP, no palpitations, no LE edema.  No dizziness.  No fevers.   Past Medical History:  Diagnosis Date  . Allergic rhinitis    Allergy testing: results pending as of 05/28/16 (Dr. Harold Hedge)  . Anemia   . Arthritis   . Cellulitis 5/12   Left (Since replacemnt of Left knee  . Chronic renal insufficiency, stage II (mild) 2015  . Complex endometrial hyperplasia with atypia 12/12   Most recent endometrial bx was 10/2012-NEG  . Cough variant asthma   . Diabetes mellitus ([place])   . Encounter for insertion of mirena IUD 5/12  . Fracture, foot 8/14   Hx of left foot stress fracture  . GERD (gastroesophageal reflux disease)   . Hemorrhoids   . Herpes zoster 10/2015   L side belt-line  . History of adenomatous polyp of colon 02/25/13   5 mm cecal polyp removed by Dr. Trevor Mace 5 yrs  . History of blood transfusion   . Hyperlipidemia    Not on statin b/c lipids "stayed down when sugars came down" per pt.  She says Dr. Chalmers Cater knows she is not on statin anymore.  . Hypertension   . Hypothyroidism   .  Nephrolithiasis 4/07  . Obesity   . OSA on CPAP   . Osteoarthritis of both knees    Bilat TKA  . Sciatica of left side   . Severe persistent asthma    cough-variant--saw Allergist 05/25/16 and was switched from max dose advair to symbicort.  Marland Kitchen UTI (lower urinary tract infection)   . Zoon's vulvitis    Bx-proven (GYN) -lichen sclerosis    Past Surgical History:  Procedure Laterality Date  . ANKLE FRACTURE SURGERY  1984   Pin & repair  . ARTHROSCOPIC REPAIR ACL  2000   Due to ACL tear  . ARTHROSCOPIC REPAIR ACL  5/08  . Blateral knee rerlacements x4 2 times each knee    . BREAST BIOPSY Right 2014  . CARDIAC CATHETERIZATION  5/07   clear vessel mild mitral   . CARPAL TUNNEL RELEASE Right [id number]   1989 Left  . CHOLECYSTECTOMY  1988  . COLONOSCOPY N/A 02/25/2013   + polypectomy x 1: Procedure: COLONOSCOPY;  Surgeon: Juanita Craver, MD;  Location: [facility];  Service: Endoscopy;  Laterality: N/A;  . COMBINED HYSTEROSCOPY DIAGNOSTIC / D&C  2/12   Bx neg  . DILATION AND CURETTAGE OF UTERUS  12/11  . REPLACEMENT TOTAL KNEE Left 5/12   X 2 on each  . TUBAL LIGATION  1985   C-Section  Outpatient Medications Prior to Visit  Medication Sig Dispense Refill  . albuterol (VENTOLIN HFA) 108 (90 Base) MCG/ACT inhaler Inhale 2 puffs into the lungs every 6 (six) hours as needed for wheezing or shortness of breath. 1 Inhaler 3  . aspirin 81 MG tablet Take 81 mg by mouth daily.    . clobetasol ointment (TEMOVATE) 0.05 % APPLY TOPICALLY 2 (TWO) TIMES DAILY. USE FOR NO MORE THAN 7 DAYS AT A TIME. 60 g 0  . enalapril (VASOTEC) 10 MG tablet Take 1 tablet (10 mg total) by mouth daily. 90 tablet 3  . insulin aspart (NOVOLOG) 100 UNIT/ML injection Use per sliding scale at each meal (avg use is 15 units tid) 10 mL 11  . insulin NPH Human (NOVOLIN N) 100 UNIT/ML injection Inject 0.55 mLs (55 Units total) into the skin 3 (three) times daily. 15 vial 1  . Insulin Syringes, Disposable, U-100 0.3 ML  MISC Use to inject insulin--6 injections per day 540 each 3  . levofloxacin (LEVAQUIN) 500 MG tablet Take 1 tablet (500 mg total) by mouth daily. 7 tablet 0  . levothyroxine (SYNTHROID, LEVOTHROID) 125 MCG tablet TAKE 1 TABLET BY MOUTH EVERY MORNING ON AN EMPTY STOMACH 30 tablet 6  . metFORMIN (GLUCOPHAGE) 500 MG tablet 1 tab po qAM and 2 tabs po qhs 270 tablet 1  . montelukast (SINGULAIR) 10 MG tablet Take 1 tablet by mouth daily.    . pantoprazole (PROTONIX) 20 MG tablet Take 1 tablet by mouth daily as needed.    . pravastatin (PRAVACHOL) 20 MG tablet Take 1 tablet (20 mg total) by mouth daily. 30 tablet 6  . predniSONE (DELTASONE) 20 MG tablet 2 tabs po qd x 5d, then 1 tab po qd x 5d 15 tablet 0  . SYMBICORT 160-4.5 MCG/ACT inhaler Inhale 2 puffs into the lungs 2 (two) times daily.    . furosemide (LASIX) 20 MG tablet 1 tab po qd x 3 days (Patient not taking: Reported on 08/23/2016) 3 tablet 0  . levofloxacin (LEVAQUIN) 500 MG tablet Take 1 tablet (500 mg total) by mouth daily. (Patient not taking: Reported on 08/23/2016) 10 tablet 0   No facility-administered medications prior to visit.     Allergies  Allergen Reactions  . Adhesive [Tape]   . Banana     Nausea and diarrhea  . Eggs Or Egg-Derived Products     Nausea and diarrhea  . Latex     sensativity  . Penicillins Rash  . Pine Swelling and Rash  . Rose Swelling and Rash    ROS As per HPI  PE: Initial bp 175/65 Blood pressure (!) 142/62, pulse 74, temperature 98 F (36.7 C), temperature source Oral, resp. rate 16, weight 286 lb (129.7 kg), last menstrual period 08/10/2010, SpO2 93 %. Gen: Alert, well appearing.  Patient is oriented to person, place, time, and situation. AFFECT: pleasant, lucid thought and speech. Breathing much easier. CV: RRR, no m/r/g.   LUNGS: CTA bilat, nonlabored resps, good aeration in all lung fields. EXT: R LL 1+ pitting edema, L LL no edema.  LABS:    Chemistry      Component Value Date/Time    NA 141 12/28/2015 1030   K 4.8 12/28/2015 1030   CL 106 12/28/2015 1030   CO2 27 12/28/2015 1030   BUN 23 12/28/2015 1030   CREATININE 0.85 12/28/2015 1030      Component Value Date/Time   CALCIUM 8.9 12/28/2015 1030   ALKPHOS 55 12/28/2015 1030  AST 10 12/28/2015 1030   ALT 13 12/28/2015 1030   BILITOT 0.4 12/28/2015 1030     Lab Results  Component Value Date   CHOL 164 12/28/2015   HDL 35.50 (L) 12/28/2015   LDLCALC 104 (H) 12/28/2015   TRIG 122.0 12/28/2015   CHOLHDL 5 12/28/2015   Lab Results  Component Value Date   HGBA1C 8.1 05/03/2016   Lab Results  Component Value Date   TSH 1.44 12/28/2015   IMPRESSION AND PLAN:  1) Acute respiratory illness c/w bronchopneumonia and acute exacerbation of severe persistent asthma:  Making good improvement now. Finish levaquin and prednisone taper. Retrocardiac infiltrate: needs repeat CXR in 4-6 weeks to document resolution.  2) Mild CHF on recent CXR: suspect this was contributing to her symptoms pretty significantly, as she noted dramatic improvement in SOB after starting lasix. Continue lasix 20mg  qd.  Check BMET today. Monitor bp/HR at home daily for the next 2 weeks.  We need to make sure her bp is well controlled: may have to increase enalapril to 20mg  qd.. Cardiomegaly with mild pulm edema on CXR 1 week ago: needs echo in near future when she is back to baseline health.  An After Visit Summary was printed and given to the patient.  FOLLOW UP: Return in about 2 weeks (around 09/06/2016) for routine chronic illness f/u (30 min appt).  Signed:  Crissie Sickles, MD           08/23/2016

## 2016-08-24 ENCOUNTER — Encounter: Payer: Self-pay | Admitting: *Deleted

## 2016-08-24 ENCOUNTER — Encounter: Payer: Self-pay | Admitting: Family Medicine

## 2016-08-25 NOTE — Telephone Encounter (Signed)
Please advise. Thanks.  

## 2016-09-06 ENCOUNTER — Ambulatory Visit (INDEPENDENT_AMBULATORY_CARE_PROVIDER_SITE_OTHER): Payer: Medicare Other | Admitting: Family Medicine

## 2016-09-06 ENCOUNTER — Encounter: Payer: Self-pay | Admitting: Family Medicine

## 2016-09-06 VITALS — BP 183/50 | HR 89 | Temp 98.0°F | Resp 16 | Wt 289.5 lb

## 2016-09-06 DIAGNOSIS — E118 Type 2 diabetes mellitus with unspecified complications: Secondary | ICD-10-CM | POA: Diagnosis not present

## 2016-09-06 DIAGNOSIS — R918 Other nonspecific abnormal finding of lung field: Secondary | ICD-10-CM | POA: Diagnosis not present

## 2016-09-06 DIAGNOSIS — Z794 Long term (current) use of insulin: Secondary | ICD-10-CM

## 2016-09-06 DIAGNOSIS — E7800 Pure hypercholesterolemia, unspecified: Secondary | ICD-10-CM | POA: Diagnosis not present

## 2016-09-06 DIAGNOSIS — R072 Precordial pain: Secondary | ICD-10-CM | POA: Diagnosis not present

## 2016-09-06 LAB — HEMOGLOBIN A1C: Hgb A1c MFr Bld: 10.2 % — ABNORMAL HIGH (ref 4.6–6.5)

## 2016-09-06 NOTE — Progress Notes (Signed)
OFFICE VISIT  09/06/2016   CC:  Chief Complaint  Patient presents with  . Follow-up    RCI, pt is not fasting.    HPI:    Patient is a 66 y.o. Caucasian female who presents for f/u DM, HTN, HLD, and severe persistent asthma. Last f/u visit we added pravastatin for its CV benefits in diabetics + LDL was 104 (goal <70).  Has had prolonged asthma exacerbation and lots of systemic steroids over the last 3-4 weeks. Finished steroids today.  Coughing up some phlegm about once a day.  Breathing feels much easier.  Rarely using rescue inhaler.    DM: fastings 120s as long as not on steroid.   No pain, tingling, or numbness in feet.  HTN: avg <130/60s.  HR 80s.  HLD: taking prava daily w/out side effects.  She is not fasting today for recheck of lipids.  ROS: she notes that she had jury duty last week x 2d, and each day when she walked from her car to the building she got diaphoretic and "a tiny bit" of substernal chest pain.  She admits she doesn't know if it was b/c of her asthma or possibly angina.  She is very fearful of CHF, mainly b/c a child of hers had a congenital heart defect and had a lot of problems from CHF.  Her last cardiac w/u was with [facility] cardiology and showed normal/clear coronaries (08/2005).  Past Medical History:  Diagnosis Date  . Allergic rhinitis    Allergy testing: results pending as of 05/28/16 (Dr. Harold Hedge)  . Anemia   . Arthritis   . Cellulitis 5/12   Left (Since replacemnt of Left knee  . Chronic renal insufficiency, stage II (mild) 2015  . Complex endometrial hyperplasia with atypia 12/12   Most recent endometrial bx was 10/2012-NEG  . Cough variant asthma   . Diabetes mellitus ([place])   . Encounter for insertion of mirena IUD 5/12  . Fracture, foot 8/14   Hx of left foot stress fracture  . GERD (gastroesophageal reflux disease)   . Hemorrhoids   . Herpes zoster 10/2015   L side belt-line  . History of adenomatous polyp of colon 02/25/13   5 mm  cecal polyp removed by Dr. Trevor Mace 5 yrs  . History of blood transfusion   . Hyperlipidemia    Not on statin b/c lipids "stayed down when sugars came down" per pt.  She says Dr. Chalmers Cater knows she is not on statin anymore.  . Hypertension   . Hypothyroidism   . Nephrolithiasis 4/07  . Obesity   . OSA on CPAP   . Osteoarthritis of both knees    Bilat TKA  . Sciatica of left side   . Severe persistent asthma    cough-variant--saw Allergist 05/25/16 and was switched from max dose advair to symbicort.  Marland Kitchen UTI (lower urinary tract infection)   . Zoon's vulvitis    Bx-proven (GYN) -lichen sclerosis    Past Surgical History:  Procedure Laterality Date  . ANKLE FRACTURE SURGERY  1984   Pin & repair  . ARTHROSCOPIC REPAIR ACL  2000   Due to ACL tear  . ARTHROSCOPIC REPAIR ACL  5/08  . Blateral knee rerlacements x4 2 times each knee    . BREAST BIOPSY Right 2014  . CARDIAC CATHETERIZATION  5/07   clear vessel mild mitral   . CARPAL TUNNEL RELEASE Right [id number]   1989 Left  . CHOLECYSTECTOMY  1988  . COLONOSCOPY N/A  02/25/2013   Tubular adenoma x 1: Recall 5 yrs. Procedure: COLONOSCOPY;  Surgeon: Juanita Craver, MD;  Location: [facility];  Service: Endoscopy;  Laterality: N/A;  . COMBINED HYSTEROSCOPY DIAGNOSTIC / D&C  2/12   Bx neg  . DILATION AND CURETTAGE OF UTERUS  12/11  . REPLACEMENT TOTAL KNEE Left 5/12   X 2 on each  . TUBAL LIGATION  1985   C-Section    Outpatient Medications Prior to Visit  Medication Sig Dispense Refill  . albuterol (VENTOLIN HFA) 108 (90 Base) MCG/ACT inhaler Inhale 2 puffs into the lungs every 6 (six) hours as needed for wheezing or shortness of breath. 1 Inhaler 3  . aspirin 81 MG tablet Take 81 mg by mouth daily.    . clobetasol ointment (TEMOVATE) 0.05 % APPLY TOPICALLY 2 (TWO) TIMES DAILY. USE FOR NO MORE THAN 7 DAYS AT A TIME. 60 g 0  . enalapril (VASOTEC) 10 MG tablet Take 1 tablet (10 mg total) by mouth daily. 90 tablet 3  . furosemide  (LASIX) 20 MG tablet 1 tab po qd 30 tablet 1  . insulin aspart (NOVOLOG) 100 UNIT/ML injection Use per sliding scale at each meal (avg use is 15 units tid) 10 mL 11  . insulin NPH Human (NOVOLIN N) 100 UNIT/ML injection Inject 0.55 mLs (55 Units total) into the skin 3 (three) times daily. 15 vial 1  . Insulin Syringes, Disposable, U-100 0.3 ML MISC Use to inject insulin--6 injections per day 540 each 3  . levothyroxine (SYNTHROID, LEVOTHROID) 125 MCG tablet TAKE 1 TABLET BY MOUTH EVERY MORNING ON AN EMPTY STOMACH 30 tablet 6  . metFORMIN (GLUCOPHAGE) 500 MG tablet 1 tab po qAM and 2 tabs po qhs 270 tablet 1  . montelukast (SINGULAIR) 10 MG tablet Take 1 tablet by mouth daily.    . pantoprazole (PROTONIX) 20 MG tablet Take 1 tablet by mouth daily as needed.    . pravastatin (PRAVACHOL) 20 MG tablet Take 1 tablet (20 mg total) by mouth daily. 30 tablet 6  . SYMBICORT 160-4.5 MCG/ACT inhaler Inhale 2 puffs into the lungs 2 (two) times daily.    Marland Kitchen levofloxacin (LEVAQUIN) 500 MG tablet Take 1 tablet (500 mg total) by mouth daily. (Patient not taking: Reported on 09/06/2016) 7 tablet 0  . predniSONE (DELTASONE) 20 MG tablet 2 tabs po qd x 5d, then 1 tab po qd x 5d (Patient not taking: Reported on 09/06/2016) 15 tablet 0   No facility-administered medications prior to visit.     Allergies  Allergen Reactions  . Adhesive [Tape]   . Banana     Nausea and diarrhea  . Eggs Or Egg-Derived Products     Nausea and diarrhea  . Latex     sensativity  . Penicillins Rash  . Pine Swelling and Rash  . Rose Swelling and Rash    ROS As per HPI  PE: Blood pressure (!) 183/50, pulse 89, temperature 98 F (36.7 C), temperature source Oral, resp. rate 16, weight 289 lb 8 oz (131.3 kg), last menstrual period 08/10/2010, SpO2 95 %. Body mass index is 55.61 kg/m.  Gen: Alert, well appearing, morbidly obese.  Patient is oriented to person, place, time, and situation. AFFECT: pleasant, lucid thought and  speech. YNW:GNFA: no injection, icteris, swelling, or exudate.  EOMI, PERRLA. Mouth: lips without lesion/swelling.  Oral mucosa pink and moist. Oropharynx without erythema, exudate, or swelling.  CV: RRR, very distant S1 and S2, no m/r/g.   LUNGS: CTA  bilat, nonlabored resps, good aeration in all lung fields. EXT: no clubbing, cyanosis, or edema.  Foot exam -  no swelling, tenderness or skin or vascular lesions. Color and temperature is normal. Sensation is intact. Peripheral pulses are palpable. Toenails are normal thick but not long or ingrown.   LABS:  Lab Results  Component Value Date   TSH 1.44 12/28/2015   Lab Results  Component Value Date   WBC 7.5 08/18/2010   HGB 10.2 (L) 08/18/2010   HCT 30.9 (L) 08/18/2010   MCV 94.5 08/18/2010   PLT 230 08/18/2010   Lab Results  Component Value Date   CREATININE 0.93 08/23/2016   BUN 27 (H) 08/23/2016   NA 139 08/23/2016   K 4.1 08/23/2016   CL 100 08/23/2016   CO2 30 08/23/2016   Lab Results  Component Value Date   ALT 13 12/28/2015   AST 10 12/28/2015   ALKPHOS 55 12/28/2015   BILITOT 0.4 12/28/2015   Lab Results  Component Value Date   CHOL 164 12/28/2015   Lab Results  Component Value Date   HDL 35.50 (L) 12/28/2015   Lab Results  Component Value Date   LDLCALC 104 (H) 12/28/2015   Lab Results  Component Value Date   TRIG 122.0 12/28/2015   Lab Results  Component Value Date   CHOLHDL 5 12/28/2015   Lab Results  Component Value Date   HGBA1C 8.1 05/03/2016   12 lead EKG today:  NSR, rate 86, normal durations and interval, no ischemic changes, low voltage in III and aVF. No prior EKG available for comparison.  IMPRESSION AND PLAN:  1) DM 2: decent control Recheck A1c. Feet exam normal today. Due for eye exam--she'll schedule.  2) HTN: with white coat component: well within normal at home. No changes today.  Recent lytes/cr good 2 weeks ago.  3) HLD: tolerating statin.  Recheck FLP at next f/u  (not fasting today).  4) Severe persistent asthma: back to baseline good control after recent prolonged flare.  5) Chest pain; question of cardiac vs pulmonary in etiology. EKG today reassuring. Refer to cardiology.  An After Visit Summary was printed and given to the patient.  FOLLOW UP: Return in about 3 months (around 12/07/2016) for routine chronic illness f/u; and 2 mo with me for welcome to medicare visit..  Signed:  Crissie Sickles, MD           09/06/2016

## 2016-09-08 ENCOUNTER — Other Ambulatory Visit: Payer: Self-pay | Admitting: *Deleted

## 2016-09-08 MED ORDER — INSULIN ASPART 100 UNIT/ML ~~LOC~~ SOLN: SUBCUTANEOUS | 6 refills | Status: DC

## 2016-09-08 MED ORDER — INSULIN NPH (HUMAN) (ISOPHANE) 100 UNIT/ML ~~LOC~~ SUSP: 58.0000 [IU] | Freq: Three times a day (TID) | SUBCUTANEOUS | 1 refills | Status: DC

## 2016-09-14 ENCOUNTER — Other Ambulatory Visit: Payer: Self-pay | Admitting: *Deleted

## 2016-09-14 MED ORDER — LEVOTHYROXINE SODIUM 125 MCG PO TABS: ORAL_TABLET | ORAL | 1 refills | Status: DC

## 2016-09-14 NOTE — Telephone Encounter (Signed)
[facility] [address] [address]  RF request for levothyroxine LOV: 09/06/16 Next ov:  11/07/16 Last written: 02/17/16 #30 w/ 4RF  12/28/15 TSH 1.44

## 2016-09-22 ENCOUNTER — Ambulatory Visit (HOSPITAL_BASED_OUTPATIENT_CLINIC_OR_DEPARTMENT_OTHER)
Admission: RE | Admit: 2016-09-22 | Discharge: 2016-09-22 | Disposition: A | Discharge status: Home / Self Care | Payer: Medicare Other | Source: Ambulatory Visit | Attending: Family Medicine | Admitting: Family Medicine

## 2016-09-22 ENCOUNTER — Other Ambulatory Visit: Payer: Self-pay | Admitting: Family Medicine

## 2016-09-22 DIAGNOSIS — R5383 Other fatigue: Secondary | ICD-10-CM | POA: Insufficient documentation

## 2016-09-22 DIAGNOSIS — R918 Other nonspecific abnormal finding of lung field: Secondary | ICD-10-CM | POA: Diagnosis not present

## 2016-09-22 DIAGNOSIS — N182 Chronic kidney disease, stage 2 (mild): Secondary | ICD-10-CM

## 2016-09-22 DIAGNOSIS — E118 Type 2 diabetes mellitus with unspecified complications: Secondary | ICD-10-CM

## 2016-09-23 ENCOUNTER — Other Ambulatory Visit (INDEPENDENT_AMBULATORY_CARE_PROVIDER_SITE_OTHER): Payer: Medicare Other

## 2016-09-23 ENCOUNTER — Other Ambulatory Visit: Payer: Self-pay | Admitting: Family Medicine

## 2016-09-23 DIAGNOSIS — R9389 Abnormal findings on diagnostic imaging of other specified body structures: Secondary | ICD-10-CM

## 2016-09-23 DIAGNOSIS — E118 Type 2 diabetes mellitus with unspecified complications: Secondary | ICD-10-CM

## 2016-09-23 DIAGNOSIS — N182 Chronic kidney disease, stage 2 (mild): Secondary | ICD-10-CM

## 2016-09-23 LAB — BASIC METABOLIC PANEL
BUN: 19 mg/dL (ref 6–23)
CALCIUM: 9.1 mg/dL (ref 8.4–10.5)
CO2: 26 mEq/L (ref 19–32)
Chloride: 103 mEq/L (ref 96–112)
Creatinine, Ser: 0.81 mg/dL (ref 0.40–1.20)
GFR: 75.25 mL/min (ref >60.00)
GLUCOSE: 294 mg/dL — AB (ref 70–99)
Potassium: 4.9 mEq/L (ref 3.5–5.1)
Sodium: 140 mEq/L (ref 135–145)

## 2016-09-23 NOTE — Progress Notes (Signed)
Cardiology Office Note   Date:  09/27/2016   ID:  [name], DOB 12/09/1950, MRN [id number]  PCP:  Tammi Sou, MD  Cardiologist:   Keyondra Lagrand [place], MD   Chief Complaint  Patient presents with  . Shortness of Breath      History of Present Illness: [name] is a 66 y.o. female who is seen at the request of Dr. Anitra Lauth for evaluation of chest pain and dyspnea. she is a retired [employer] scientist. She has a history of DM on insulin, HTN, HLD, and severe asthma. She has OSA and uses CPAP.  She did have prior evaluation with a Nuclear stress test in 2007 that was suboptimal. Cardiac cath at that time showed normal coronary anatomy.  At the end of April she was diagnosed with PNA. She had low grade fever, cough, and increased SOB. CXR showed prominent vascular markings and borderline CM. She was treated with antibiotics. Also started on lasix. Follow up CXR was improved. She is scheduled for CT chest to evaluate a retrocardiac density. She states her breathing is back to baseline. No edema. No palpitations or chest pain.    Past Medical History:  Diagnosis Date  . Allergic rhinitis    Allergy testing: results pending as of 05/28/16 (Dr. Harold Hedge)  . Anemia   . Arthritis   . Cellulitis 5/12   Left (Since replacemnt of Left knee  . Chronic renal insufficiency, stage II (mild) 2015  . Complex endometrial hyperplasia with atypia 12/12   Most recent endometrial bx was 10/2012-NEG  . Cough variant asthma   . Diabetes mellitus ([place])   . Encounter for insertion of mirena IUD 5/12  . Fracture, foot 8/14   Hx of left foot stress fracture  . GERD (gastroesophageal reflux disease)   . Hemorrhoids   . Herpes zoster 10/2015   L side belt-line  . History of adenomatous polyp of colon 02/25/13   5 mm cecal polyp removed by Dr. Trevor Mace 5 yrs  . History of blood transfusion   . Hyperlipidemia    Not on statin b/c lipids "stayed down when sugars came down" per pt.  She says Dr. Chalmers Cater  knows she is not on statin anymore.  . Hypertension   . Hypothyroidism   . Nephrolithiasis 4/07  . Obesity   . OSA on CPAP   . Osteoarthritis of both knees    Bilat TKA  . Sciatica of left side   . Severe persistent asthma    cough-variant--saw Allergist 05/25/16 and was switched from max dose advair to symbicort.  Marland Kitchen UTI (lower urinary tract infection)   . Zoon's vulvitis    Bx-proven (GYN) -lichen sclerosis    Past Surgical History:  Procedure Laterality Date  . ANKLE FRACTURE SURGERY  1984   Pin & repair  . ARTHROSCOPIC REPAIR ACL  2000   Due to ACL tear  . ARTHROSCOPIC REPAIR ACL  5/08  . Blateral knee rerlacements x4 2 times each knee    . BREAST BIOPSY Right 2014  . CARDIAC CATHETERIZATION  5/07   clear vessel mild mitral   . CARPAL TUNNEL RELEASE Right [id number]   1989 Left  . CHOLECYSTECTOMY  1988  . COLONOSCOPY N/A 02/25/2013   Tubular adenoma x 1: Recall 5 yrs. Procedure: COLONOSCOPY;  Surgeon: Juanita Craver, MD;  Location: [facility];  Service: Endoscopy;  Laterality: N/A;  . COMBINED HYSTEROSCOPY DIAGNOSTIC / D&C  2/12   Bx neg  . DILATION AND  CURETTAGE OF UTERUS  12/11  . REPLACEMENT TOTAL KNEE Left 5/12   X 2 on each  . TUBAL LIGATION  1985   C-Section     Current Outpatient Prescriptions  Medication Sig Dispense Refill  . albuterol (VENTOLIN HFA) 108 (90 Base) MCG/ACT inhaler Inhale 2 puffs into the lungs every 6 (six) hours as needed for wheezing or shortness of breath. 1 Inhaler 3  . aspirin 81 MG tablet Take 81 mg by mouth daily.    . clobetasol ointment (TEMOVATE) 0.05 % APPLY TOPICALLY 2 (TWO) TIMES DAILY. USE FOR NO MORE THAN 7 DAYS AT A TIME. 60 g 0  . enalapril (VASOTEC) 10 MG tablet Take 1 tablet (10 mg total) by mouth daily. 90 tablet 3  . furosemide (LASIX) 20 MG tablet Take 20 mg by mouth daily.    . insulin aspart (NOVOLOG) 100 UNIT/ML injection Use per sliding scale at each meal (avg use is 17 units tid) 10 mL 6  . insulin NPH Human  (NOVOLIN N) 100 UNIT/ML injection Inject 0.58 mLs (58 Units total) into the skin 3 (three) times daily. 15 vial 1  . Insulin Syringes, Disposable, U-100 0.3 ML MISC Use to inject insulin--6 injections per day 540 each 3  . levothyroxine (SYNTHROID, LEVOTHROID) 125 MCG tablet TAKE 1 TABLET BY MOUTH EVERY MORNING ON AN EMPTY STOMACH 90 tablet 1  . metFORMIN (GLUCOPHAGE) 500 MG tablet Take 500 mg by mouth each morning. Take 1,000 mg by mouth each evening.    . montelukast (SINGULAIR) 10 MG tablet Take 1 tablet by mouth daily.    . pantoprazole (PROTONIX) 20 MG tablet Take 1 tablet by mouth daily as needed.    . pravastatin (PRAVACHOL) 20 MG tablet Take 1 tablet (20 mg total) by mouth daily. 30 tablet 6  . SYMBICORT 160-4.5 MCG/ACT inhaler Inhale 2 puffs into the lungs 2 (two) times daily.     No current facility-administered medications for this visit.     Allergies:   Adhesive [tape]; Banana; Eggs or egg-derived products; Latex; Penicillins; Pine; and Rose    Social History:  The patient  reports that she has never smoked. She has never used smokeless tobacco. She reports that she does not drink alcohol or use drugs.   Family History:  The patient's family history includes Alzheimer's disease in her mother; Arthritis in her mother; COPD in her father; Diabetes in her brother and father; Other in her son; Rheum arthritis in her brother; Stroke in her father; Thyroid disease in her brother.    ROS:  Please see the history of present illness.   She does have chronic sciatica and low back pain. Otherwise, review of systems are positive for none.   All other systems are reviewed and negative.    PHYSICAL EXAM: VS:  BP (!) 170/70 (BP Location: Right Arm)   Pulse 96   Ht 5\' 1"  (1.549 m)   Wt 290 lb 9.6 oz (131.8 kg)   LMP 08/10/2010   BMI 54.91 kg/m  , BMI Body mass index is 54.91 kg/m. GEN: Well nourished, obese WF, in no acute distress  HEENT: normal  Neck: no JVD, carotid bruits, or  masses Cardiac: RRR; normal S1-2. Gr 1-2/6 SEM RUSB. Otherwise  no  Rubs or gallops,no edema  Respiratory:  clear to auscultation bilaterally, normal work of breathing GI: soft, nontender, nondistended, + BS MS: no deformity or atrophy  Skin: warm and dry, no rash Neuro:  Strength and sensation are intact  Psych: euthymic mood, full affect   EKG:  EKG is not ordered today. The ekg ordered 09/06/16 demonstrates NSR with normal Ecg. I have personally reviewed and interpreted this study.    Recent Labs: 12/28/2015: ALT 13; TSH 1.44 09/23/2016: BUN 19; Creatinine, Ser 0.81; Potassium 4.9; Sodium 140    Lipid Panel    Component Value Date/Time   CHOL 164 12/28/2015 1030   TRIG 122.0 12/28/2015 1030   HDL 35.50 (L) 12/28/2015 1030   CHOLHDL 5 12/28/2015 1030   VLDL 24.4 12/28/2015 1030   LDLCALC 104 (H) 12/28/2015 1030      Wt Readings from Last 3 Encounters:  09/27/16 290 lb 9.6 oz (131.8 kg)  09/06/16 289 lb 8 oz (131.3 kg)  08/23/16 286 lb (129.7 kg)      Other studies Reviewed: Additional studies/ records that were reviewed today include: none Review of the above records demonstrates: N/A   ASSESSMENT AND PLAN:  1.  Dyspnea. Recent PNA in April. ? Component of CHF. Breathing back to baseline either as result of antibiotics or lasix or both. Will arrange an Echocardiogram to further evaluate.  2. Heart murmur. Check Echo 3. Obesity with OSA on CPAP 4. HTN. Patient reports white coat syndrome. Follow up with primary care 5. IDDM 6. Mixed dyslipidemia.   Current medicines are reviewed at length with the patient today.  The patient does not have concerns regarding medicines.  The following changes have been made:  no change  Labs/ tests ordered today include:   Orders Placed This Encounter  Procedures  . ECHOCARDIOGRAM COMPLETE     Disposition:   FU with me TBD  Signed, Hans Rusher [place], MD  09/27/2016 2:16 PM    [facility] [address] [place], [place], [address] Phone [phone], Fax [phone]

## 2016-09-27 ENCOUNTER — Ambulatory Visit (INDEPENDENT_AMBULATORY_CARE_PROVIDER_SITE_OTHER): Payer: Medicare Other | Admitting: Cardiology

## 2016-09-27 ENCOUNTER — Encounter: Payer: Self-pay | Admitting: Cardiology

## 2016-09-27 VITALS — BP 170/70 | HR 96 | Ht 61.0 in | Wt 290.6 lb

## 2016-09-27 DIAGNOSIS — G4733 Obstructive sleep apnea (adult) (pediatric): Secondary | ICD-10-CM

## 2016-09-27 DIAGNOSIS — R011 Cardiac murmur, unspecified: Secondary | ICD-10-CM

## 2016-09-27 DIAGNOSIS — R0600 Dyspnea, unspecified: Secondary | ICD-10-CM | POA: Insufficient documentation

## 2016-09-27 DIAGNOSIS — R0602 Shortness of breath: Secondary | ICD-10-CM

## 2016-09-27 DIAGNOSIS — I10 Essential (primary) hypertension: Secondary | ICD-10-CM | POA: Insufficient documentation

## 2016-09-27 NOTE — Patient Instructions (Signed)
We will schedule you for an Echocardiogram  We will await the results of your CT scan

## 2016-10-04 ENCOUNTER — Ambulatory Visit (HOSPITAL_BASED_OUTPATIENT_CLINIC_OR_DEPARTMENT_OTHER)
Admission: RE | Admit: 2016-10-04 | Discharge: 2016-10-04 | Disposition: A | Discharge status: Home / Self Care | Payer: Medicare Other | Source: Ambulatory Visit | Attending: Family Medicine | Admitting: Family Medicine

## 2016-10-04 ENCOUNTER — Encounter (HOSPITAL_BASED_OUTPATIENT_CLINIC_OR_DEPARTMENT_OTHER): Payer: Self-pay

## 2016-10-04 DIAGNOSIS — R938 Abnormal findings on diagnostic imaging of other specified body structures: Secondary | ICD-10-CM | POA: Diagnosis not present

## 2016-10-04 DIAGNOSIS — J9811 Atelectasis: Secondary | ICD-10-CM | POA: Diagnosis not present

## 2016-10-04 DIAGNOSIS — R9389 Abnormal findings on diagnostic imaging of other specified body structures: Secondary | ICD-10-CM

## 2016-10-04 MED ORDER — IOPAMIDOL (ISOVUE-300) INJECTION 61%
100.0000 mL | Freq: Once | INTRAVENOUS | Status: AC | PRN
  Administered 2016-10-04: 80 mL via INTRAVENOUS

## 2016-10-11 ENCOUNTER — Other Ambulatory Visit: Payer: Self-pay

## 2016-10-11 ENCOUNTER — Ambulatory Visit (HOSPITAL_COMMUNITY): Payer: Medicare Other | Attending: Cardiovascular Disease

## 2016-10-11 DIAGNOSIS — I10 Essential (primary) hypertension: Secondary | ICD-10-CM | POA: Diagnosis not present

## 2016-10-11 DIAGNOSIS — R011 Cardiac murmur, unspecified: Secondary | ICD-10-CM

## 2016-10-11 DIAGNOSIS — G4733 Obstructive sleep apnea (adult) (pediatric): Secondary | ICD-10-CM

## 2016-10-11 DIAGNOSIS — I5030 Unspecified diastolic (congestive) heart failure: Secondary | ICD-10-CM | POA: Diagnosis not present

## 2016-10-11 DIAGNOSIS — R0602 Shortness of breath: Secondary | ICD-10-CM | POA: Diagnosis not present

## 2016-10-11 HISTORY — PX: TRANSTHORACIC ECHOCARDIOGRAM: SHX275

## 2016-10-12 ENCOUNTER — Other Ambulatory Visit: Payer: Self-pay | Admitting: Family Medicine

## 2016-10-13 NOTE — Telephone Encounter (Signed)
[facility] [address]  RF request for furosemide LOV: 09/06/16 Next ov: 11/07/16 Last written: 08/23/16 #30 w/ 1RF

## 2016-10-27 ENCOUNTER — Telehealth: Payer: Self-pay

## 2016-10-27 NOTE — Telephone Encounter (Signed)
Spoke to patient Dr.Jordan advised only has to follow up as needed.

## 2016-10-31 ENCOUNTER — Other Ambulatory Visit: Payer: Self-pay | Admitting: Obstetrics & Gynecology

## 2016-10-31 DIAGNOSIS — Z1231 Encounter for screening mammogram for malignant neoplasm of breast: Secondary | ICD-10-CM

## 2016-11-01 ENCOUNTER — Other Ambulatory Visit: Payer: Self-pay | Admitting: Family Medicine

## 2016-11-01 ENCOUNTER — Ambulatory Visit (HOSPITAL_BASED_OUTPATIENT_CLINIC_OR_DEPARTMENT_OTHER)
Admission: RE | Admit: 2016-11-01 | Discharge: 2016-11-01 | Disposition: A | Discharge status: Home / Self Care | Payer: Medicare Other | Source: Ambulatory Visit | Attending: Obstetrics & Gynecology | Admitting: Obstetrics & Gynecology

## 2016-11-01 ENCOUNTER — Encounter (HOSPITAL_BASED_OUTPATIENT_CLINIC_OR_DEPARTMENT_OTHER): Payer: Self-pay

## 2016-11-01 DIAGNOSIS — Z1231 Encounter for screening mammogram for malignant neoplasm of breast: Secondary | ICD-10-CM

## 2016-11-01 NOTE — Progress Notes (Signed)
66 y.o. G73P2002 Married Caucasian F here for annual exam.  Had issues this year with asthma.  Was exacerbated by mild congestive heart failure.  Echo done 6/18 was good.  Feels using lasix and longer course of steroids really helped.  Joints and back/sciatica are continuing to be issues for her as well.  Using walker still due to back issues.  Denies vaginal bleeding.    Reports daughter is doing really well.  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  Patient's last menstrual period was 08/10/2010.          Sexually active: No.  The current method of family planning is IUD.    Exercising: Yes.    walking Smoker:  no  Health Maintenance: Pap:  07/02/15 negative, 05/05/14 negative, 10/30/12 negative  History of abnormal Pap:  yes MMG:  11/01/16 BIRADS 1 negative Colonoscopy:  02/25/13 Dr. Collene Mares- polyps- repeat 5 years  BMD:   09/09/07 normal  TDaP:  10/17/16  Pneumonia vaccine(s):  04/12/99, 05/03/16 Zostavax:   Had shingles about a year ago Hep C testing: declined Screening Labs: PCP, Hb today: PCP   reports that she has never smoked. She has never used smokeless tobacco. She reports that she does not drink alcohol or use drugs.  Past Medical History:  Diagnosis Date  . Allergic rhinitis    Allergy testing: results pending as of 05/28/16 (Dr. Harold Hedge)  . Anemia   . Arthritis   . Cellulitis 5/12   Left (Since replacemnt of Left knee  . Chronic renal insufficiency, stage II (mild) 2015  . Complex endometrial hyperplasia with atypia 12/12   Most recent endometrial bx was 10/2012-NEG  . Cough variant asthma   . Diabetes mellitus ([place])   . Encounter for insertion of mirena IUD 5/12  . Fracture, foot 8/14   Hx of left foot stress fracture  . GERD (gastroesophageal reflux disease)   . Hemorrhoids   . Herpes zoster 10/2015   L side belt-line  . History of adenomatous polyp of colon 02/25/13   5 mm cecal polyp removed by Dr. Trevor Mace 5 yrs  . History of blood transfusion   .  Hyperlipidemia    Not on statin b/c lipids "stayed down when sugars came down" per pt.  She says Dr. Chalmers Cater knows she is not on statin anymore.  . Hypertension   . Hypothyroidism   . Nephrolithiasis 4/07  . Obesity   . OSA on CPAP   . Osteoarthritis of both knees    Bilat TKA  . Sciatica of left side   . Severe persistent asthma    cough-variant--saw Allergist 05/25/16 and was switched from max dose advair to symbicort.  Marland Kitchen UTI (lower urinary tract infection)   . Zoon's vulvitis    Bx-proven (GYN) -lichen sclerosis    Past Surgical History:  Procedure Laterality Date  . ANKLE FRACTURE SURGERY  1984   Pin & repair  . ARTHROSCOPIC REPAIR ACL  2000   Due to ACL tear  . ARTHROSCOPIC REPAIR ACL  5/08  . Blateral knee rerlacements x4 2 times each knee    . BREAST BIOPSY Right 2014   Benign  . CARDIAC CATHETERIZATION  5/07   clear vessel mild mitral   . CARPAL TUNNEL RELEASE Right [id number]   1989 Left  . CHOLECYSTECTOMY  1988  . COLONOSCOPY N/A 02/25/2013   Tubular adenoma x 1: Recall 5 yrs. Procedure: COLONOSCOPY;  Surgeon: Juanita Craver, MD;  Location: [facility];  Service: Endoscopy;  Laterality: N/A;  . COMBINED HYSTEROSCOPY DIAGNOSTIC / D&C  2/12   Bx neg  . DILATION AND CURETTAGE OF UTERUS  12/11  . REPLACEMENT TOTAL KNEE Left 5/12   X 2 on each  . TUBAL LIGATION  1985   C-Section    Current Outpatient Prescriptions  Medication Sig Dispense Refill  . albuterol (VENTOLIN HFA) 108 (90 Base) MCG/ACT inhaler Inhale 2 puffs into the lungs every 6 (six) hours as needed for wheezing or shortness of breath. 1 Inhaler 3  . aspirin 81 MG tablet Take 81 mg by mouth daily.    . clobetasol ointment (TEMOVATE) 0.05 % APPLY TOPICALLY 2 (TWO) TIMES DAILY. USE FOR NO MORE THAN 7 DAYS AT A TIME. 60 g 0  . enalapril (VASOTEC) 10 MG tablet Take 1 tablet (10 mg total) by mouth daily. 90 tablet 3  . furosemide (LASIX) 20 MG tablet TAKE 1 TABLET BY MOUTH EVERY DAY 30 tablet 1  . IBUPROFEN PO  Take by mouth.    . insulin aspart (NOVOLOG) 100 UNIT/ML injection Use per sliding scale at each meal (avg use is 17 units tid) 10 mL 6  . insulin NPH Human (NOVOLIN N) 100 UNIT/ML injection Inject 0.58 mLs (58 Units total) into the skin 3 (three) times daily. 15 vial 1  . Insulin Syringes, Disposable, U-100 0.3 ML MISC Use to inject insulin--6 injections per day 540 each 3  . levothyroxine (SYNTHROID, LEVOTHROID) 125 MCG tablet TAKE 1 TABLET BY MOUTH EVERY MORNING ON AN EMPTY STOMACH 90 tablet 1  . MELATONIN PO Take by mouth.    . metFORMIN (GLUCOPHAGE) 500 MG tablet Take 500 mg by mouth each morning. Take 1,000 mg by mouth each evening.    . montelukast (SINGULAIR) 10 MG tablet Take 1 tablet by mouth daily.    . pravastatin (PRAVACHOL) 20 MG tablet Take 1 tablet (20 mg total) by mouth daily. 30 tablet 6  . SYMBICORT 160-4.5 MCG/ACT inhaler Inhale 2 puffs into the lungs 2 (two) times daily.     No current facility-administered medications for this visit.     Family History  Problem Relation Age of Onset  . Diabetes Father   . COPD Father   . Stroke Father   . Rheum arthritis Brother   . Thyroid disease Brother   . Diabetes Brother   . Arthritis Mother   . Alzheimer's disease Mother   . Other Son        VSD/pulmonary atresia  . Breast cancer Other     ROS:  Pertinent items are noted in HPI.  Otherwise, a comprehensive ROS was negative.  Exam:   BP (!) 136/58 (BP Location: Right Arm, Patient Position: Sitting, Cuff Size: Large)   Pulse 82   Resp 18   Ht 5\' 1"  (1.549 m)   Wt 290 lb 6.4 oz (131.7 kg)   LMP 08/10/2010   BMI 54.87 kg/m   Weight change:   Height: 5\' 1"  (154.9 cm)  Ht Readings from Last 3 Encounters:  11/03/16 5\' 1"  (1.549 m)  09/27/16 5\' 1"  (1.549 m)  07/02/15 5' 0.5" (1.537 m)    General appearance: alert, cooperative and appears stated age Head: Normocephalic, without obvious abnormality, atraumatic Neck: no adenopathy, supple, symmetrical, trachea midline  and thyroid normal to inspection and palpation Lungs: clear to auscultation bilaterally Breasts: normal appearance, no masses or tenderness Heart: regular rate and rhythm Abdomen: soft, non-tender; bowel sounds normal; no masses,  no organomegaly Extremities: extremities normal, atraumatic,  no cyanosis or edema Skin: Skin color, texture, turgor normal. No rashes or lesions Lymph nodes: Cervical, supraclavicular, and axillary nodes normal. No abnormal inguinal nodes palpated Neurologic: Grossly normal   Pelvic: External genitalia:  Erythema within labia majora with some skin breakdown, area of thickened white tissue on anterior size of left labia majora.  Biopsy recommended.  Normal appearing urethra with no masses, tenderness or lesions              Bartholins and Skenes: normal                 Vagina: normal appearing vagina with normal color and discharge, no lesions              Cervix: no lesions              Pap taken: Yes.   Bimanual Exam:  Uterus:  normal size, contour, position, consistency, mobility, non-tender              Adnexa: normal adnexa and no mass, fullness, tenderness               Rectovaginal: Confirms               Anus:  normal sphincter tone, no lesions  Vulvar biopsy:  Area cleansed with Betadine x 3.  0.5cc 1% Lidocaine instilled beneath lesion.  33mm punch biopsy obtained and excised with sterile technique.  Silver nitrate used for excellent hemostasis.  Pt tolerated procedure well.  No dressing applied.  Chaperone was present for exam.  A:  Well Woman with normal exam Morbid obesity Sciatica Diabetes, insulin requiring H/O Zoon's vulvitis (biposy proven) and lichen sclerosus (biopsy proven) with worsening appearing this year H/O elevated lipids GERD H/O complex endometrial hyperplasia without atypia.  Mirena IUD removed after five years last year. Asthma OSA  P:   Mammogram guidelines reviewed pap smear obtained today Sees Dr. Chalmers Cater every four  months Vulvar biopsy pending.  Results will be communicated to the patient.   No RF for clobetasol sent to pharmacy today.  Will await biopsy results. return annually or prn

## 2016-11-03 ENCOUNTER — Encounter: Payer: Self-pay | Admitting: Obstetrics & Gynecology

## 2016-11-03 ENCOUNTER — Ambulatory Visit (INDEPENDENT_AMBULATORY_CARE_PROVIDER_SITE_OTHER): Payer: Medicare Other | Admitting: Obstetrics & Gynecology

## 2016-11-03 VITALS — BP 136/58 | HR 82 | Resp 18 | Ht 61.0 in | Wt 290.4 lb

## 2016-11-03 DIAGNOSIS — Z124 Encounter for screening for malignant neoplasm of cervix: Secondary | ICD-10-CM

## 2016-11-03 DIAGNOSIS — Z01411 Encounter for gynecological examination (general) (routine) with abnormal findings: Secondary | ICD-10-CM | POA: Diagnosis not present

## 2016-11-03 DIAGNOSIS — L900 Lichen sclerosus et atrophicus: Secondary | ICD-10-CM

## 2016-11-03 DIAGNOSIS — L292 Pruritus vulvae: Secondary | ICD-10-CM | POA: Diagnosis not present

## 2016-11-03 NOTE — Patient Instructions (Signed)
Please have Dr. Anitra Lauth add a Hepatitis C antibody to future blood work.

## 2016-11-04 ENCOUNTER — Other Ambulatory Visit (HOSPITAL_COMMUNITY)
Admission: RE | Admit: 2016-11-04 | Discharge: 2016-11-04 | Disposition: A | Discharge status: Home / Self Care | Payer: Medicare Other | Source: Ambulatory Visit | Attending: Obstetrics & Gynecology | Admitting: Obstetrics & Gynecology

## 2016-11-04 DIAGNOSIS — Z124 Encounter for screening for malignant neoplasm of cervix: Secondary | ICD-10-CM | POA: Diagnosis not present

## 2016-11-04 DIAGNOSIS — N904 Leukoplakia of vulva: Secondary | ICD-10-CM | POA: Diagnosis not present

## 2016-11-07 ENCOUNTER — Ambulatory Visit: Payer: Medicare Other | Admitting: Family Medicine

## 2016-11-08 LAB — CYTOLOGY - PAP: Diagnosis: NEGATIVE

## 2016-11-14 ENCOUNTER — Telehealth: Payer: Self-pay | Admitting: *Deleted

## 2016-11-14 NOTE — Telephone Encounter (Signed)
Patient returned call. Results reviewed with patient and she verbalized understanding. Patient aware to use the clobetasol on labia majora twice daily. One month recheck scheduled for Tuesday 12/06/16 at 1430. Patient agreeable to date and time of appointment.   Patient agreeable to disposition. Will close encounter.

## 2016-11-14 NOTE — Telephone Encounter (Signed)
-----   Message from Megan Salon, MD sent at 11/10/2016  5:53 PM EDT ----- Please let pt know her vulvar biopsy showed lichen sclerosus.  Pap was normal.  02 pap recall.  I'd like her to use the clobetasol inside the labia majora twice daily and recheck 1 month.

## 2016-11-14 NOTE — Telephone Encounter (Signed)
Message left to return call to [name] at [phone].    

## 2016-11-16 ENCOUNTER — Ambulatory Visit (INDEPENDENT_AMBULATORY_CARE_PROVIDER_SITE_OTHER): Payer: Medicare Other | Admitting: Family Medicine

## 2016-11-16 ENCOUNTER — Encounter: Payer: Self-pay | Admitting: Family Medicine

## 2016-11-16 VITALS — BP 147/77 | HR 70 | Temp 97.9°F | Resp 16 | Ht 61.0 in | Wt 288.5 lb

## 2016-11-16 DIAGNOSIS — Z0000 Encounter for general adult medical examination without abnormal findings: Secondary | ICD-10-CM

## 2016-11-16 NOTE — Progress Notes (Signed)
WELCOME TO MEDICARE (IPPE) VISIT I explained that today's visit was for the purpose of health promotion and disease detection, as well as an introduction to Medicare and it's covered benefits.  I explained that no labs or other services would be performed today, but if any were determined to be necessary then appropriate orders/referrals would be arranged for these to be done at a future date.  Patient is a 66 y/o white female who is already a pt established with me, here for her initial welcome to medicare visit.  Recent labs: Lab Results  Component Value Date   TSH 1.44 12/28/2015   Lab Results  Component Value Date   WBC 7.5 08/18/2010   HGB 10.2 (L) 08/18/2010   HCT 30.9 (L) 08/18/2010   MCV 94.5 08/18/2010   PLT 230 08/18/2010   Lab Results  Component Value Date   CREATININE 0.81 09/23/2016   BUN 19 09/23/2016   NA 140 09/23/2016   K 4.9 09/23/2016   CL 103 09/23/2016   CO2 26 09/23/2016   Lab Results  Component Value Date   ALT 13 12/28/2015   AST 10 12/28/2015   ALKPHOS 55 12/28/2015   BILITOT 0.4 12/28/2015   Lab Results  Component Value Date   CHOL 164 12/28/2015   Lab Results  Component Value Date   HDL 35.50 (L) 12/28/2015   Lab Results  Component Value Date   LDLCALC 104 (H) 12/28/2015   Lab Results  Component Value Date   TRIG 122.0 12/28/2015   Lab Results  Component Value Date   CHOLHDL 5 12/28/2015   Lab Results  Component Value Date   HGBA1C 10.2 Repeated and verified X2. (H) 09/06/2016    Pt's medical and social history were reviewed. Specifically, we reviewed PMH/PSH/Meds/FH.  Also reviewed alcohol, tobacco, and illicit drug use.  Diet and physical activity reviewed.   All of this info is also found in the appropriate sections of pt's EMR.  Pt was screened with appropriate screening instrument for depression.  Current or past experiences with mood disorders was discussed.  Normal today.  Normal today. 2 falls, discussed and  determined these were not related to ongoing fall risk.  Pt's functional ability and level of safety were reviewed. Specifically, I screened for hearing impairment and fall risk.  I assessed home safety and we discussed pt's competency with activities of daily living.    EXAM: Vitals:   11/16/16 1406  BP: (!) 147/77  Pulse: 70  Resp: 16  Temp: 97.9 F (36.6 C)  SpO2: 97%   Body mass index is 54.51 kg/m. Visual acuity screen: No exam data present   Pt gets routine eye care through optometrist/ophthalmologist and is up to date with follow up care with this provider.  No additional physical exam required or indicated today.  End of life planning: Advanced directives and power of attorney information specific to the patient were discussed.  Pt has this in place.    Education, counseling, and referral for other preventive services: Written checklist was completed and given to pt for obtaining, as appropriate, the other preventive services that are covered as separate Medicare Part B benefits. Possible services that were reviewed with pt are:  -annual wellness visit (AWV) -Bone mass measurements -Cardiovascular screening blood tests: these are done already, according to pt's risk b/c she has DM 2 -Colorectal cancer screening: colonoscopy 02/25/13 showed one adenomatous polyp.  Plan is for repeat colonoscopy 02/2018. -Counseling to prevent tobacco use -Diabetes screening tests:  N/A b/c pt has dx of DM 2. -Diabetes self-management training (DSMT) -Glaucoma screening -HIV screening -Medical nutrition therapy -Prostate cancer screening: n/a -Seasonal influenza, pneumococcal, and Hep B vaccines -screening mammography -screening pap tests and pelvic exam -ultrasound screening for AAA  Of the above listed services, no referrals made today.  Patient did not  have an additional complaint/problem that was discussed and evaluated today.  Patient was given opportunity to ask any  additional questions regarding Medicare and covered benefits.  Patient was informed that Medicare does not provide coverage for routine physical exams.  I answered all questions to the best of my ability today.    An After Visit Summary was printed and given to the patient.  Follow up: Return in about 4 weeks (around 12/14/2016) for routine chronic illness f/u.  Signed:  Crissie Sickles, MD           11/18/2016

## 2016-11-16 NOTE — Progress Notes (Signed)
WELCOME TO MEDICARE (IPPE) VISIT I explained that today's visit was for the purpose of health promotion and disease detection, as well as an introduction to Medicare and it's covered benefits.  I explained that no labs or other services would be performed today, but if any were determined to be necessary then appropriate orders/referrals would be arranged for these to be done at a future date.  Patient is a 66 y/o white female who is already a pt established with me, here for her initial welcome to medicare visit.   Pt's medical and social history were reviewed. Specifically, we reviewed PMH/PSH/Meds/FH.  Also reviewed alcohol, tobacco, and illicit drug use.  Diet and physical activity reviewed.   All of this info is also found in the appropriate sections of pt's EMR.  Pt was screened with appropriate screening instrument for depression.  Current or past experiences with mood disorders was discussed.    Depression screen PHQ 2/9 11/16/2016  Decreased Interest 0  Down, Depressed, Hopeless 0  PHQ - 2 Score 0    Fall Risk  11/16/2016  Falls in the past year? Yes  Number falls in past yr: 2 or more  Injury with Fall? No  One fall was from slipping in her dog's vomit b/c she didn't see it. The other was from leaning over from sitting at a table and just toppled over due to momentum.  Pt's functional ability and level of safety were reviewed. Specifically, I screened for hearing impairment and fall risk.  I assessed home safety and we discussed pt's competency with activities of daily living.  Pt performs all of her ADL w/out assistance   EXAM: Vitals:   11/16/16 1406  BP: (!) 147/77  Pulse: 70  Resp: 16  Temp: 97.9 F (36.6 C)   Body mass index is 54.51 kg/m. Visual acuity screen:   Pt gets routine eye care through optometrist/ophthalmologist and is not up to date with follow up care with this provider. No additional physical exam required or indicated today.  End of life  planning: Advanced directives and power of attorney information specific to the patient were discussed.  Yes, pt has this in place.    Education, counseling, and referral for other preventive services: Written checklist was completed and given to pt for obtaining, as appropriate, the other preventive services that are covered as separate Medicare Part B benefits. Possible services that were reviewed with pt are:  -annual wellness visit (AWV) -Bone mass measurements: pt is interested but NOT RIGHT NOW. -Cardiovascular screening blood tests: these are done already, according to pt's risk b/c she has DM 2 -Colorectal cancer screening: colonoscopy 02/25/13 showed one adenomatous polyp.  Plan is for repeat colonoscopy 02/2018. -Counseling to prevent tobacco use: n/a -Diabetes screening tests: N/A b/c pt has dx of DM 2. -Diabetes self-management training (DSMT) -Glaucoma screening: taken care of by her ophthalmologist annually. -HIV screening: pt does not want this. -Hep C screening: she agrees to this test, but will get at a later date. -Medical nutrition therapy: has already participated. -Prostate cancer screening: n/a -Seasonal influenza, pneumococcal, and Hep B vaccines: pt has had hep B vaccine in the past.  She needs prevnar 13 after 05/03/17. -screening mammography: this was done 2-3 wks ago. -screening pap tests and pelvic exam: utd--this year about 3 wks ago--this was normal. -ultrasound screening for AAA--no FH of 1st deg relative with this---pt doesn't qualify.  Of the above listed services, no referrals were made today. She will need prevnar  13 after 05/03/17. Next screening colonoscopy 02/2018. She will get Hep C screening test at next o/v for chronic illness f/u --along with her other labs at that time. She will contact her eye MD to schedule routine Diab retpthy screening appt and glaucoma screening. She agrees to bone density testing in the future but NOT RIGHT NOW.  Patient  did not have an additional complaint/problem that was discussed and evaluated today.  Patient was given opportunity to ask any additional questions regarding Medicare and covered benefits.  Patient was informed that Medicare does not provide coverage for routine physical exams.  I answered all questions to the best of my ability today.    An After Visit Summary was printed and given to the patient.  Follow up: Return in about 4 weeks (around 12/14/2016) for routine chronic illness f/u.  Signed:  Crissie Sickles, MD           11/16/2016

## 2016-12-03 ENCOUNTER — Other Ambulatory Visit: Payer: Self-pay | Admitting: Family Medicine

## 2016-12-05 NOTE — Telephone Encounter (Signed)
[facility] [address]

## 2016-12-06 ENCOUNTER — Ambulatory Visit (INDEPENDENT_AMBULATORY_CARE_PROVIDER_SITE_OTHER): Payer: Medicare Other | Admitting: Obstetrics & Gynecology

## 2016-12-06 ENCOUNTER — Encounter: Payer: Self-pay | Admitting: Obstetrics & Gynecology

## 2016-12-06 VITALS — BP 144/80 | HR 84 | Resp 20 | Wt 292.1 lb

## 2016-12-06 DIAGNOSIS — L900 Lichen sclerosus et atrophicus: Secondary | ICD-10-CM | POA: Diagnosis not present

## 2016-12-06 MED ORDER — CLOBETASOL PROPIONATE 0.05 % EX OINT: TOPICAL_OINTMENT | Freq: Two times a day (BID) | CUTANEOUS | 1 refills | Status: DC

## 2016-12-06 NOTE — Progress Notes (Signed)
GYNECOLOGY  VISIT   HPI: 66 y.o. G83P2002 Married Caucasian female here for follow up of lichen sclerosus biopsied at last visit on 11/04/16.  Pt has been using clobetasol 0.05% ointment bid and states that symptoms are improved.  She still have some itching and irritation.  Denies bleeding.  Has significant incontinence.  Does have some difficulty getting the ointment in the correct location.    GYNECOLOGIC HISTORY: Patient's last menstrual period was 08/10/2010. Contraception: PMP Menopausal hormone therapy: none  Patient Active Problem List   Diagnosis Date Noted  . HTN (hypertension) 09/27/2016  . Dyspnea 09/27/2016  . Shingles outbreak 10/20/2015  . 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  . Hypothyroidism 07/10/2015  . OSA (obstructive sleep apnea) 07/10/2015  . Endometrial hyperplasia 07/02/2015  . Bronchitis 06/01/2015  . Cough variant asthma 11/02/2014  . Diabetes mellitus ([place])   . Obesity     Past Medical History:  Diagnosis Date  . Allergic rhinitis    Allergy testing: results pending as of 05/28/16 (Dr. Harold Hedge)  . Anemia   . Arthritis   . Cellulitis 5/12   Left (Since replacemnt of Left knee  . Chronic renal insufficiency, stage II (mild) 2015  . Complex endometrial hyperplasia with atypia 12/12   Most recent endometrial bx was 10/2012-NEG  . Cough variant asthma   . Diabetes mellitus ([place])   . Encounter for insertion of mirena IUD 5/12  . Fracture, foot 8/14   Hx of left foot stress fracture  . GERD (gastroesophageal reflux disease)   . Hemorrhoids   . Herpes zoster 10/2015   L side belt-line  . History of adenomatous polyp of colon 02/25/13   5 mm cecal polyp removed by Dr. Trevor Mace 5 yrs  . History of blood transfusion   . Hyperlipidemia    Not on statin b/c lipids "stayed down when sugars came down" per pt.  She says Dr. Chalmers Cater knows she is not on statin anymore.  . Hypertension   . Hypothyroidism   . Nephrolithiasis 4/07  . Obesity   . OSA on CPAP   .  Osteoarthritis of both knees    Bilat TKA  . Sciatica of left side   . Severe persistent asthma    cough-variant--saw Allergist 05/25/16 and was switched from max dose advair to symbicort.  Marland Kitchen UTI (lower urinary tract infection)   . Zoon's vulvitis    Bx-proven (GYN) -lichen sclerosis    Past Surgical History:  Procedure Laterality Date  . ANKLE FRACTURE SURGERY  1984   Pin & repair  . ARTHROSCOPIC REPAIR ACL  2000   Due to ACL tear  . ARTHROSCOPIC REPAIR ACL  5/08  . Blateral knee rerlacements x4 2 times each knee    . BREAST BIOPSY Right 2014   Benign  . CARDIAC CATHETERIZATION  5/07   clear vessel mild mitral   . CARPAL TUNNEL RELEASE Right [id number]   1989 Left  . CHOLECYSTECTOMY  1988  . COLONOSCOPY N/A 02/25/2013   Tubular adenoma x 1: Recall 5 yrs. Procedure: COLONOSCOPY;  Surgeon: Juanita Craver, MD;  Location: [facility];  Service: Endoscopy;  Laterality: N/A;  . COMBINED HYSTEROSCOPY DIAGNOSTIC / D&C  2/12   Bx neg  . DEXA  08/2007   Bone density normal.  . DILATION AND CURETTAGE OF UTERUS  12/11  . REPLACEMENT TOTAL KNEE Left 5/12   X 2 on each  . TUBAL LIGATION  1985   C-Section    MEDS:   Current Outpatient Prescriptions  on File Prior to Visit  Medication Sig Dispense Refill  . albuterol (VENTOLIN HFA) 108 (90 Base) MCG/ACT inhaler Inhale 2 puffs into the lungs every 6 (six) hours as needed for wheezing or shortness of breath. 1 Inhaler 3  . aspirin 81 MG tablet Take 81 mg by mouth daily.    . enalapril (VASOTEC) 10 MG tablet Take 1 tablet (10 mg total) by mouth daily. 90 tablet 3  . furosemide (LASIX) 20 MG tablet TAKE 1 TABLET BY MOUTH EVERY DAY 30 tablet 1  . IBUPROFEN PO Take by mouth as needed.     . insulin aspart (NOVOLOG) 100 UNIT/ML injection Use per sliding scale at each meal (avg use is 17 units tid) 10 mL 6  . insulin NPH Human (NOVOLIN N) 100 UNIT/ML injection Inject 0.58 mLs (58 Units total) into the skin 3 (three) times daily. 15 vial 1  .  Insulin Syringes, Disposable, U-100 0.3 ML MISC Use to inject insulin--6 injections per day 540 each 3  . levothyroxine (SYNTHROID, LEVOTHROID) 125 MCG tablet TAKE 1 TABLET BY MOUTH EVERY MORNING ON AN EMPTY STOMACH 90 tablet 1  . MELATONIN PO Take by mouth.    . metFORMIN (GLUCOPHAGE) 500 MG tablet TAKE 1 TABLET BY MOUTH EVERY MORNING AND 2 TABLETS BY MOUTH AT BEDTIME 270 tablet 1  . montelukast (SINGULAIR) 10 MG tablet Take 1 tablet by mouth daily.    . pravastatin (PRAVACHOL) 20 MG tablet Take 1 tablet (20 mg total) by mouth daily. 30 tablet 6  . SYMBICORT 160-4.5 MCG/ACT inhaler Inhale 2 puffs into the lungs 2 (two) times daily.     No current facility-administered medications on file prior to visit.     ALLERGIES: Adhesive [tape]; Banana; Eggs or egg-derived products; Latex; Penicillins; Pine; and Rose  Family History  Problem Relation Age of Onset  . Diabetes Father   . COPD Father   . Stroke Father   . Rheum arthritis Brother   . Thyroid disease Brother   . Diabetes Brother   . Arthritis Mother   . Alzheimer's disease Mother   . Other Son        VSD/pulmonary atresia  . Breast cancer Other     SH:  Married, non smoker  Review of Systems  Constitutional: Negative.   Gastrointestinal: Negative.   Genitourinary: Negative for dysuria, frequency, hematuria and urgency.    PHYSICAL EXAMINATION:    BP (!) 144/80 (BP Location: Right Arm, Patient Position: Sitting, Cuff Size: Large)   Pulse 84   Resp 20   Wt 292 lb 1.6 oz (132.5 kg)   LMP 08/10/2010   BMI 55.19 kg/m     Physical Exam  Constitutional: She appears well-developed and well-nourished.  Genitourinary:    There is lesion on the right labia. There is no rash, tenderness or injury on the right labia. There is no rash on the left labia.  Lymphadenopathy:       Right: No inguinal adenopathy present.       Left: No inguinal adenopathy present.  Psychiatric: She has a normal mood and affect.    Chaperone was  present for exam.  Assessment: Lichen sclerosus, biopsy proven  Plan: Continue nightly clobetasol 0.05% ointment and start applying vaseline daily for skin barrier.

## 2016-12-11 ENCOUNTER — Other Ambulatory Visit: Payer: Self-pay | Admitting: Family Medicine

## 2016-12-18 DIAGNOSIS — Z23 Encounter for immunization: Secondary | ICD-10-CM | POA: Diagnosis not present

## 2016-12-19 ENCOUNTER — Other Ambulatory Visit: Payer: Self-pay | Admitting: Family Medicine

## 2016-12-28 ENCOUNTER — Encounter: Payer: Self-pay | Admitting: Family Medicine

## 2016-12-29 ENCOUNTER — Other Ambulatory Visit: Payer: Self-pay | Admitting: Family Medicine

## 2017-01-06 DIAGNOSIS — J3089 Other allergic rhinitis: Secondary | ICD-10-CM | POA: Diagnosis not present

## 2017-01-06 DIAGNOSIS — J301 Allergic rhinitis due to pollen: Secondary | ICD-10-CM | POA: Diagnosis not present

## 2017-01-06 DIAGNOSIS — J4550 Severe persistent asthma, uncomplicated: Secondary | ICD-10-CM | POA: Diagnosis not present

## 2017-01-06 DIAGNOSIS — T781XXA Other adverse food reactions, not elsewhere classified, initial encounter: Secondary | ICD-10-CM | POA: Diagnosis not present

## 2017-01-10 ENCOUNTER — Other Ambulatory Visit: Payer: Self-pay | Admitting: Family Medicine

## 2017-01-30 ENCOUNTER — Other Ambulatory Visit: Payer: Self-pay | Admitting: Family Medicine

## 2017-01-30 ENCOUNTER — Other Ambulatory Visit: Payer: Self-pay | Admitting: Obstetrics & Gynecology

## 2017-01-30 NOTE — Telephone Encounter (Signed)
Medication refill request: Clobetasol Last AEX:  11/03/16 SM Next AEX: not scheduled yet  Last MMG (if hormonal medication request): 11/01/16 BIRADS 1 negative/density b Refill authorized: 12/06/16 #60g w/1 refill; today please advise, Dr. Sabra Heck out of the office.

## 2017-01-30 NOTE — Telephone Encounter (Signed)
I will route this to Dr. Sabra Heck to review the refill regarding the quantity the patient is using.  Cc- Dr. Sabra Heck

## 2017-02-01 NOTE — Telephone Encounter (Signed)
Spoke with patient, Patient states that symptoms are getting better but would like refill.

## 2017-02-01 NOTE — Telephone Encounter (Signed)
Refill done.  Needs recheck since still symptomatic.  Please schedule.  Thanks.

## 2017-02-01 NOTE — Telephone Encounter (Signed)
Can you please see how her symptoms are doing?  May need recheck if not a lot better.

## 2017-02-02 NOTE — Telephone Encounter (Signed)
Patient scheduled for 02/09/17 at 4:00.

## 2017-02-06 ENCOUNTER — Ambulatory Visit: Payer: Medicare Other | Admitting: Family Medicine

## 2017-02-07 ENCOUNTER — Telehealth: Payer: Self-pay | Admitting: Obstetrics & Gynecology

## 2017-02-07 NOTE — Telephone Encounter (Signed)
Patient canceled her upcoming recheck appointment 02/09/17. Patient to call later to reschedule.

## 2017-02-09 ENCOUNTER — Ambulatory Visit: Payer: Self-pay | Admitting: Obstetrics & Gynecology

## 2017-02-13 ENCOUNTER — Ambulatory Visit (INDEPENDENT_AMBULATORY_CARE_PROVIDER_SITE_OTHER): Payer: Medicare Other | Admitting: Family Medicine

## 2017-02-13 ENCOUNTER — Encounter: Payer: Self-pay | Admitting: Family Medicine

## 2017-02-13 VITALS — BP 130/68 | HR 75 | Temp 97.9°F | Resp 16 | Ht 61.0 in | Wt 296.5 lb

## 2017-02-13 DIAGNOSIS — E7800 Pure hypercholesterolemia, unspecified: Secondary | ICD-10-CM | POA: Diagnosis not present

## 2017-02-13 DIAGNOSIS — E039 Hypothyroidism, unspecified: Secondary | ICD-10-CM | POA: Diagnosis not present

## 2017-02-13 DIAGNOSIS — J069 Acute upper respiratory infection, unspecified: Secondary | ICD-10-CM

## 2017-02-13 DIAGNOSIS — J4551 Severe persistent asthma with (acute) exacerbation: Secondary | ICD-10-CM | POA: Diagnosis not present

## 2017-02-13 DIAGNOSIS — B9789 Other viral agents as the cause of diseases classified elsewhere: Secondary | ICD-10-CM

## 2017-02-13 DIAGNOSIS — E119 Type 2 diabetes mellitus without complications: Secondary | ICD-10-CM

## 2017-02-13 DIAGNOSIS — I10 Essential (primary) hypertension: Secondary | ICD-10-CM | POA: Diagnosis not present

## 2017-02-13 LAB — LIPID PANEL
CHOL/HDL RATIO: 5
Cholesterol: 144 mg/dL (ref 0–200)
HDL: 31.8 mg/dL — ABNORMAL LOW (ref >39.00)
LDL Cholesterol: 75 mg/dL (ref 0–99)
NONHDL: 111.7
Triglycerides: 184 mg/dL — ABNORMAL HIGH (ref 0.0–149.0)
VLDL: 36.8 mg/dL (ref 0.0–40.0)

## 2017-02-13 LAB — COMPREHENSIVE METABOLIC PANEL
ALT: 20 U/L (ref 0–35)
AST: 21 U/L (ref 0–37)
Albumin: 3.6 g/dL (ref 3.5–5.2)
Alkaline Phosphatase: 55 U/L (ref 39–117)
BILIRUBIN TOTAL: 0.4 mg/dL (ref 0.2–1.2)
BUN: 19 mg/dL (ref 6–23)
CO2: 29 meq/L (ref 19–32)
CREATININE: 0.88 mg/dL (ref 0.40–1.20)
Calcium: 9.2 mg/dL (ref 8.4–10.5)
Chloride: 102 mEq/L (ref 96–112)
GFR: 68.3 mL/min (ref >60.00)
GLUCOSE: 117 mg/dL — AB (ref 70–99)
Potassium: 4.4 mEq/L (ref 3.5–5.1)
SODIUM: 140 meq/L (ref 135–145)
Total Protein: 6.6 g/dL (ref 6.0–8.3)

## 2017-02-13 LAB — TSH: TSH: 1.49 u[IU]/mL (ref 0.35–4.50)

## 2017-02-13 LAB — HEMOGLOBIN A1C: HEMOGLOBIN A1C: 7.9 % — AB (ref 4.6–6.5)

## 2017-02-13 MED ORDER — PREDNISONE 20 MG PO TABS: ORAL_TABLET | ORAL | 0 refills | Status: DC

## 2017-02-13 MED ORDER — INSULIN NPH (HUMAN) (ISOPHANE) 100 UNIT/ML ~~LOC~~ SUSP: SUBCUTANEOUS | 1 refills | Status: DC

## 2017-02-13 NOTE — Progress Notes (Signed)
OFFICE VISIT  02/13/2017   CC:  Chief Complaint  Patient presents with  . Follow-up    RCI, pt is fasting.    HPI:    Patient is a 66 y.o. Caucasian female who presents for 5 mo f/u DM 2, HTN, HLD.  Also, with acute resp illness:  Onset of cough and wheeze about 1 week ago, using albut 2-3 times per day and helps for a few hours. No fevers.  Feeling tired.  Chest is tight, feels some SOB. Some ST, nasal congestion/forehead pressure, some L ear pain.  Most recent albut dosing was last night. No n/v/d or rash.  DM: has had some readings in 60s mainly in the mornings--esp in the last 1 week. Avg fasting gluc to 100-130,  No checking of gluc later in the day.  HTN: normal at home consistently.  HLD: taking pravastatin w/out problem daily.  Past Medical History:  Diagnosis Date  . Allergic rhinitis    Allergy testing: results pending as of 05/28/16 (Dr. Harold Hedge)  . Anemia   . Arthritis   . Cellulitis 5/12   Left (Since replacemnt of Left knee  . Chronic renal insufficiency, stage II (mild) 2015  . Complex endometrial hyperplasia with atypia 12/12   Most recent endometrial bx was 10/2012-NEG  . Cough variant asthma   . Diabetes mellitus ([place])   . Encounter for insertion of mirena IUD 5/12  . Fracture, foot 8/14   Hx of left foot stress fracture  . GERD (gastroesophageal reflux disease)   . Hemorrhoids   . Herpes zoster 10/2015   L side belt-line  . History of adenomatous polyp of colon 02/25/13   5 mm cecal polyp removed by Dr. Trevor Mace 5 yrs  . History of blood transfusion   . Hyperlipidemia    Not on statin b/c lipids "stayed down when sugars came down" per pt.  She says Dr. Chalmers Cater knows she is not on statin anymore.  . Hypertension   . Hypothyroidism   . Nephrolithiasis 4/07  . Obesity   . OSA on CPAP   . Osteoarthritis of both knees    Bilat TKA  . Sciatica of left side   . Severe persistent asthma    cough-variant--saw Allergist 05/25/16 and was switched  from max dose advair to symbicort.  Marland Kitchen UTI (lower urinary tract infection)   . Zoon's vulvitis    Bx-proven (GYN) -lichen sclerosis.  Clobetasol 0.05% ointment per GYN    Past Surgical History:  Procedure Laterality Date  . ANKLE FRACTURE SURGERY  1984   Pin & repair  . ARTHROSCOPIC REPAIR ACL  2000   Due to ACL tear  . ARTHROSCOPIC REPAIR ACL  5/08  . Blateral knee rerlacements x4 2 times each knee    . BREAST BIOPSY Right 2014   Benign  . CARDIAC CATHETERIZATION  5/07   clear vessel mild mitral   . CARPAL TUNNEL RELEASE Right [id number]   1989 Left  . CHOLECYSTECTOMY  1988  . COMBINED HYSTEROSCOPY DIAGNOSTIC / D&C  2/12   Bx neg  . DEXA  08/2007   Bone density normal.  . DILATION AND CURETTAGE OF UTERUS  12/11  . REPLACEMENT TOTAL KNEE Left 5/12   X 2 on each  . TUBAL LIGATION  1985   C-Section    Outpatient Medications Prior to Visit  Medication Sig Dispense Refill  . albuterol (PROVENTIL HFA;VENTOLIN HFA) 108 (90 Base) MCG/ACT inhaler Take 2 puff into the lungs every 6  hours as needed for wheeze or shortness of breath 18 Inhaler 3  . aspirin 81 MG tablet Take 81 mg by mouth daily.    . clobetasol ointment (TEMOVATE) 0.05 % APPLY TO AFFECTED AREA TWICE A DAY 60 g 1  . enalapril (VASOTEC) 10 MG tablet TAKE 1 TABLET BY MOUTH EVERY DAY 90 tablet 0  . furosemide (LASIX) 20 MG tablet TAKE 1 TABLET BY MOUTH EVERY DAY 30 tablet 1  . IBUPROFEN PO Take by mouth as needed.     . Insulin Syringes, Disposable, U-100 0.3 ML MISC Use to inject insulin--6 injections per day 540 each 3  . levocetirizine (XYZAL) 5 MG tablet Take 1 tablet daily as needed by mouth.  5  . levothyroxine (SYNTHROID, LEVOTHROID) 125 MCG tablet TAKE 1 TABLET BY MOUTH EVERY MORNING ON AN EMPTY STOMACH 90 tablet 1  . MELATONIN PO Take by mouth.    . metFORMIN (GLUCOPHAGE) 500 MG tablet TAKE 1 TABLET BY MOUTH EVERY MORNING AND 2 TABLETS BY MOUTH AT BEDTIME 270 tablet 1  . montelukast (SINGULAIR) 10 MG tablet Take  1 tablet by mouth daily.    Marland Kitchen NOVOLOG 100 UNIT/ML injection USE PER SLIDING SCALE AT EACH MEAL (AVG USE IS 17 UNITS TID) 20 mL 4  . pravastatin (PRAVACHOL) 20 MG tablet TAKE 1 TABLET (20 MG TOTAL) BY MOUTH DAILY. 30 tablet 6  . SYMBICORT 160-4.5 MCG/ACT inhaler Inhale 2 puffs into the lungs 2 (two) times daily.    . insulin NPH Human (NOVOLIN N) 100 UNIT/ML injection Inject 0.58 mLs (58 Units total) into the skin 3 (three) times daily. 15 vial 1   No facility-administered medications prior to visit.     Allergies  Allergen Reactions  . Adhesive [Tape]   . Banana     Nausea and diarrhea  . Eggs Or Egg-Derived Products     Nausea and diarrhea  . Latex     sensativity  . Penicillins Rash  . Pine Swelling and Rash  . Rose Swelling and Rash    ROS As per HPI  PE: Blood pressure 130/68, pulse 75, temperature 97.9 F (36.6 C), temperature source Oral, resp. rate 16, height 5\' 1"  (1.549 m), weight 296 lb 8 oz (134.5 kg), last menstrual period 08/10/2010, SpO2 94 %. VS: noted--normal. Gen: alert, NAD, NONTOXIC APPEARING. HEENT: eyes without injection, drainage, or swelling.  Ears: EACs clear, TMs with normal light reflex and landmarks.  Nose: Clear rhinorrhea, with some dried, crusty exudate adherent to mildly injected mucosa.  No purulent d/c.  No paranasal sinus TTP.  No facial swelling.  Throat and mouth without focal lesion.  No pharyngial swelling, erythema, or exudate.   Neck: supple, no LAD.   LUNGS: CTA bilat, nonlabored resps.  Mild decreased aeration on exhalation and mild prolongation of exp phase.  Lots of post-exhalation coughing. CV: RRR, no m/r/g. EXT: no c/c/e SKIN: no rash  LABS:  Lab Results  Component Value Date   TSH 1.44 12/28/2015   Lab Results  Component Value Date   WBC 7.5 08/18/2010   HGB 10.2 (L) 08/18/2010   HCT 30.9 (L) 08/18/2010   MCV 94.5 08/18/2010   PLT 230 08/18/2010   Lab Results  Component Value Date   CREATININE 0.81 09/23/2016   BUN  19 09/23/2016   NA 140 09/23/2016   K 4.9 09/23/2016   CL 103 09/23/2016   CO2 26 09/23/2016   Lab Results  Component Value Date   ALT 13 12/28/2015  AST 10 12/28/2015   ALKPHOS 55 12/28/2015   BILITOT 0.4 12/28/2015   Lab Results  Component Value Date   CHOL 164 12/28/2015   Lab Results  Component Value Date   HDL 35.50 (L) 12/28/2015   Lab Results  Component Value Date   LDLCALC 104 (H) 12/28/2015   Lab Results  Component Value Date   TRIG 122.0 12/28/2015   Lab Results  Component Value Date   CHOLHDL 5 12/28/2015   Lab Results  Component Value Date   HGBA1C 10.2 Repeated and verified X2. (H) 09/06/2016   IMPRESSION AND PLAN:  1) Acute asthma exacerbation:  Viral URI with cough, + acute asthma exacerbation. Get otc generic robitussin DM OR Mucinex DM and use as directed on the packaging for cough and congestion. Use otc generic saline nasal spray 2-3 times per day to irrigate/moisturize your nasal passages. Prednisone 40mg  qd x 5d, then 20mg  qd x 5d.  Continue albuterol HFA q4h prn. Flu vaccine UTD.  2) DM 2: per home fastings her control has improved. Due to some morning hypoglycemia, we'll decrease her Novolin N evening dose from 58 U to 55 U. Continue novolog 17 U with each meal. HbA1c check today.  3) HTN: The current medical regimen is effective;  continue present plan and medications. Lytes/cr today.  4) Hyperlipidemia: tolerating statin.  FLP obtained today.  Hepatic panel today.  5) Hypothyroidism: TSH monitoring ordered today.  An After Visit Summary was printed and given to the patient.  FOLLOW UP: Return in about 3 months (around 05/16/2017) for routine chronic illness f/u.  Signed:  Crissie Sickles, MD           02/13/2017

## 2017-02-13 NOTE — Patient Instructions (Signed)
Decrease your evening dose of novolin N to 55 Units. Continue 17 units of novolog with each meal.  Get otc generic robitussin DM OR Mucinex DM and use as directed on the packaging for cough and congestion. Use otc generic saline nasal spray 2-3 times per day to irrigate/moisturize your nasal passages.

## 2017-02-14 ENCOUNTER — Encounter: Payer: Self-pay | Admitting: *Deleted

## 2017-02-20 ENCOUNTER — Other Ambulatory Visit: Payer: Self-pay | Admitting: Family Medicine

## 2017-02-27 ENCOUNTER — Other Ambulatory Visit: Payer: Self-pay | Admitting: *Deleted

## 2017-02-27 MED ORDER — FUROSEMIDE 20 MG PO TABS: 20.0000 mg | ORAL_TABLET | Freq: Every day | ORAL | 1 refills | Status: DC

## 2017-03-27 ENCOUNTER — Other Ambulatory Visit: Payer: Self-pay

## 2017-03-27 DIAGNOSIS — I10 Essential (primary) hypertension: Secondary | ICD-10-CM

## 2017-03-27 MED ORDER — ENALAPRIL MALEATE 10 MG PO TABS: 10.0000 mg | ORAL_TABLET | Freq: Every day | ORAL | 1 refills | Status: DC

## 2017-04-05 ENCOUNTER — Ambulatory Visit: Payer: Self-pay | Admitting: *Deleted

## 2017-04-05 NOTE — Telephone Encounter (Signed)
Called in c/o having a terrible sore throat and an asthma flare.  She is using Mucinex DM and throat lozenges as well as her rescue inhaler.   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      She has an appt with Dr. Raoul Pitch on Friday 04/07/17 at 1:00.    She has asked to be placed on a wait list if case someone cancels. I instructed her to call us back if her symptoms became worse before her Friday appt or go to an urgent care center.   She verbalized understanding.     I routed a note to the nurse pool at [facility] making them a ware of her request.  Reason for Disposition . [1] Mild wheezing is intermittent AND [2] persists > 3 days  Answer Assessment - Initial Assessment Questions 1. RESPIRATORY STATUS: "Describe your breathing?" (e.g., wheezing, shortness of breath, unable to speak, severe coughing)      Severe coughing, wheezing and a sore throat.     2. ONSET: "When did this asthma attack begin?"      I can't stop coughing.   Christmas Eve. 3. TRIGGER: "What do you think triggered this attack?" (e.g., URI, exposure to pollen or other allergen, tobacco smoke)      I'm not sure 4. PEAK EXPIRATORY FLOW RATE (PEFR): "Do you use a peak flow meter?" If so, ask: "What's the current peak flow? What's your personal best peak flow?"      Don't use one 5. SEVERITY: "How bad is this attack?"    - MILD: No SOB at rest, mild SOB with walking, speaks normally in sentences, can lay down, no retractions, pulse < 100. (GREEN Zone: PEFR 80-100%)   - MODERATE: SOB at rest, SOB with minimal exertion and prefers to sit, cannot lie down flat, speaks in phrases, mild retractions, audible wheezing, pulse 100-120. (YELLOW Zone: PEFR 50-80%)    - SEVERE: Very SOB at rest, speaks in single words, struggling to breathe, sitting hunched forward, retractions, usually loud wheezing, sometimes minimal wheezing because of  decreased air movement, pulse > 120. (RED Zone: PEFR < 50%).      Not using meter. 6. MEDICATIONS (Inhaler or nebs): "What are your asthma medications?" and "What treatments have you given so far?"    - Quick-relief: albuterol, metaproterenol, salbutamol, or other inhaled or nebulized beta-agonist medicines   - Long-term-control: steroids, cromolyn, or other anti-inflammatory medicines.  My rescue inhaler usually works but it helps very little now.  It helps for about 2 hours then the coughing starts again.     Mucinex DM and other regular asthma medications but they haven't helped. 7. OTHER SYMPTOMS: "Do you have any other symptoms? (e.g., runny nose, chest pain, fever)        Sore throat, runny nose, non productive, had fever last night 101. 8. PREGNANCY: "Is there any chance you are pregnant?" "When was your last menstrual period?"     Not asked due to age  Protocols used: ASTHMA ATTACK-A-AH

## 2017-04-05 NOTE — Telephone Encounter (Signed)
Placed patient on Dr. Idelle Leech wait list

## 2017-04-07 ENCOUNTER — Encounter: Payer: Self-pay | Admitting: Family Medicine

## 2017-04-07 ENCOUNTER — Ambulatory Visit (INDEPENDENT_AMBULATORY_CARE_PROVIDER_SITE_OTHER): Payer: Medicare Other | Admitting: Family Medicine

## 2017-04-07 VITALS — BP 137/76 | HR 71 | Temp 98.2°F | Wt 292.8 lb

## 2017-04-07 DIAGNOSIS — R062 Wheezing: Secondary | ICD-10-CM

## 2017-04-07 DIAGNOSIS — J4520 Mild intermittent asthma, uncomplicated: Secondary | ICD-10-CM | POA: Diagnosis not present

## 2017-04-07 MED ORDER — PREDNISONE 50 MG PO TABS: 50.0000 mg | ORAL_TABLET | Freq: Every day | ORAL | 0 refills | Status: DC

## 2017-04-07 MED ORDER — DOXYCYCLINE HYCLATE 100 MG PO TABS: 100.0000 mg | ORAL_TABLET | Freq: Two times a day (BID) | ORAL | 0 refills | Status: DC

## 2017-04-07 MED ORDER — IPRATROPIUM-ALBUTEROL 0.5-2.5 (3) MG/3ML IN SOLN
3.0000 mL | Freq: Once | RESPIRATORY_TRACT | Status: AC
  Administered 2017-04-07: 3 mL via RESPIRATORY_TRACT

## 2017-04-07 NOTE — Patient Instructions (Addendum)
I have prescribed prednisone 5 day course and doxycyline (10 days).  Rest and hydrate.  Continue mucinex DM and albuterol inhaler every 4-6 hours as needed only for wheezing or shortness of breath . You have an asthma exacerbation with bronchitis.     Acute Bronchitis, Adult Acute bronchitis is when air tubes (bronchi) in the lungs suddenly get swollen. The condition can make it hard to breathe. It can also cause these symptoms:  A cough.  Coughing up clear, yellow, or green mucus.  Wheezing.  Chest congestion.  Shortness of breath.  A fever.  Body aches.  Chills.  A sore throat.  Follow these instructions at home: Medicines  Take over-the-counter and prescription medicines only as told by your doctor.  If you were prescribed an antibiotic medicine, take it as told by your doctor. Do not stop taking the antibiotic even if you start to feel better. General instructions  Rest.  Drink enough fluids to keep your pee (urine) clear or pale yellow.  Avoid smoking and secondhand smoke. If you smoke and you need help quitting, ask your doctor. Quitting will help your lungs heal faster.  Use an inhaler, cool mist vaporizer, or humidifier as told by your doctor.  Keep all follow-up visits as told by your doctor. This is important. How is this prevented? To lower your risk of getting this condition again:  Wash your hands often with soap and water. If you cannot use soap and water, use hand sanitizer.  Avoid contact with people who have cold symptoms.  Try not to touch your hands to your mouth, nose, or eyes.  Make sure to get the flu shot every year.  Contact a doctor if:  Your symptoms do not get better in 2 weeks. Get help right away if:  You cough up blood.  You have chest pain.  You have very bad shortness of breath.  You become dehydrated.  You faint (pass out) or keep feeling like you are going to pass out.  You keep throwing up (vomiting).  You  have a very bad headache.  Your fever or chills gets worse. This information is not intended to replace advice given to you by your health care provider. Make sure you discuss any questions you have with your health care provider. Document Released: 09/14/2007 Document Revised: 11/04/2015 Document Reviewed: 09/16/2015 Elsevier Interactive Patient Education  [employer].

## 2017-04-07 NOTE — Progress Notes (Signed)
[name] , 1950-07-06, 66 y.o., female MRN: [id number] Patient Care Team    Relationship Specialty Notifications Start End  McGowen, Adrian Blackwater, MD PCP - General Family Medicine  09/19/14   Harold Hedge, Darrick Grinder, MD Consulting Physician Allergy and Immunology  05/28/16   Juanita Craver, MD Consulting Physician Gastroenterology  09/06/16   [place], Peter M, MD Consulting Physician Cardiology  09/29/16   Megan Salon, MD Consulting Physician Gynecology  12/28/16     Chief Complaint  Patient presents with  . Sore Throat    pts complain of painful sore throat for about 2 weeks  . Asthma    pts complain of her asthma flaring up     Subjective: Pt presents for an OV with complaints of cough, chills, fatigue, headache, wheezing and sore throat of 2 weeks duration. She has been taking her mucinex DM, advil, flonase, symbicort, singulair, xyzal and albuterol inhaler use is every 4-6 hours.  She has a similar episode about 2 months ago, and reports illness completely resolved with treatment.  Depression screen [facility] 2/9 11/17/2016 11/16/2016  Decreased Interest 0 0  Down, Depressed, Hopeless 0 0  PHQ - 2 Score 0 0    Allergies  Allergen Reactions  . Adhesive [Tape]   . Banana     Nausea and diarrhea  . Eggs Or Egg-Derived Products     Nausea and diarrhea  . Latex     sensativity  . Penicillins Rash  . Pine Swelling and Rash  . Rose Swelling and Rash   Social History   Tobacco Use  . Smoking status: Never Smoker  . Smokeless tobacco: Never Used  Substance Use Topics  . Alcohol use: No    Alcohol/week: 0.0 oz   Past Medical History:  Diagnosis Date  . Allergic rhinitis    Allergy testing: results pending as of 05/28/16 (Dr. Harold Hedge)  . Anemia   . Arthritis   . Cellulitis 5/12   Left (Since replacemnt of Left knee  . Chronic renal insufficiency, stage II (mild) 2015  . Complex endometrial hyperplasia with atypia 12/12   Most recent endometrial bx was 10/2012-NEG  . Cough  variant asthma   . Diabetes mellitus ([place])   . Encounter for insertion of mirena IUD 5/12  . Fracture, foot 8/14   Hx of left foot stress fracture  . GERD (gastroesophageal reflux disease)   . Hemorrhoids   . Herpes zoster 10/2015   L side belt-line  . History of adenomatous polyp of colon 02/25/13   5 mm cecal polyp removed by Dr. Trevor Mace 5 yrs  . History of blood transfusion   . Hyperlipidemia    Not on statin b/c lipids "stayed down when sugars came down" per pt.  She says Dr. Chalmers Cater knows she is not on statin anymore.  . Hypertension   . Hypothyroidism   . Nephrolithiasis 4/07  . Obesity   . OSA on CPAP   . Osteoarthritis of both knees    Bilat TKA  . Sciatica of left side   . Severe persistent asthma    cough-variant--saw Allergist 05/25/16 and was switched from max dose advair to symbicort.  Marland Kitchen UTI (lower urinary tract infection)   . Zoon's vulvitis    Bx-proven (GYN) -lichen sclerosis.  Clobetasol 0.05% ointment per GYN   Past Surgical History:  Procedure Laterality Date  . ANKLE FRACTURE SURGERY  1984   Pin & repair  . ARTHROSCOPIC REPAIR ACL  2000  Due to ACL tear  . ARTHROSCOPIC REPAIR ACL  5/08  . Blateral knee rerlacements x4 2 times each knee    . BREAST BIOPSY Right 2014   Benign  . CARDIAC CATHETERIZATION  5/07   clear vessel mild mitral   . CARPAL TUNNEL RELEASE Right [id number]   1989 Left  . CHOLECYSTECTOMY  1988  . COLONOSCOPY N/A 02/25/2013   Tubular adenoma x 1: Recall 5 yrs. Procedure: COLONOSCOPY;  Surgeon: Juanita Craver, MD;  Location: [facility];  Service: Endoscopy;  Laterality: N/A;  . COMBINED HYSTEROSCOPY DIAGNOSTIC / D&C  2/12   Bx neg  . DEXA  08/2007   Bone density normal.  . DILATION AND CURETTAGE OF UTERUS  12/11  . REPLACEMENT TOTAL KNEE Left 5/12   X 2 on each  . TUBAL LIGATION  1985   C-Section   Family History  Problem Relation Age of Onset  . Diabetes Father   . COPD Father   . Stroke Father   . Rheum arthritis  Brother   . Thyroid disease Brother   . Diabetes Brother   . Arthritis Mother   . Alzheimer's disease Mother   . Other Son        VSD/pulmonary atresia  . Breast cancer Other    Allergies as of 04/07/2017      Reactions   Adhesive [tape]    Banana    Nausea and diarrhea   Eggs Or Egg-derived Products    Nausea and diarrhea   Latex    sensativity   Penicillins Rash   Pine Swelling, Rash   Rose Swelling, Rash      Medication List        Accurate as of 04/07/17  1:06 PM. Always use your most recent med list.          albuterol 108 (90 Base) MCG/ACT inhaler Commonly known as:  PROVENTIL HFA;VENTOLIN HFA Take 2 puff into the lungs every 6 hours as needed for wheeze or shortness of breath   aspirin 81 MG tablet Take 81 mg by mouth daily.   clobetasol ointment 0.05 % Commonly known as:  TEMOVATE APPLY TO AFFECTED AREA TWICE A DAY   enalapril 10 MG tablet Commonly known as:  VASOTEC Take 1 tablet (10 mg total) by mouth daily.   furosemide 20 MG tablet Commonly known as:  LASIX Take 1 tablet (20 mg total) daily by mouth.   IBUPROFEN PO Take by mouth as needed.   insulin NPH Human 100 UNIT/ML injection Commonly known as:  NOVOLIN N 58 U SQ qAM, 58 U SQ mid day, and 55 U SQ q evening   Insulin Syringes (Disposable) U-100 0.3 ML Misc Use to inject insulin--6 injections per day   levocetirizine 5 MG tablet Commonly known as:  XYZAL Take 1 tablet daily as needed by mouth.   levothyroxine 125 MCG tablet Commonly known as:  SYNTHROID, LEVOTHROID TAKE 1 TABLET BY MOUTH EVERY MORNING ON AN EMPTY STOMACH   MELATONIN PO Take by mouth.   metFORMIN 500 MG tablet Commonly known as:  GLUCOPHAGE TAKE 1 TABLET BY MOUTH EVERY MORNING AND 2 TABLETS BY MOUTH AT BEDTIME   montelukast 10 MG tablet Commonly known as:  SINGULAIR Take 1 tablet by mouth daily.   NOVOLOG 100 UNIT/ML injection Generic drug:  insulin aspart USE PER SLIDING SCALE AT EACH MEAL (AVG USE IS 17  UNITS TID)   pravastatin 20 MG tablet Commonly known as:  PRAVACHOL TAKE 1 TABLET (20  MG TOTAL) BY MOUTH DAILY.   predniSONE 20 MG tablet Commonly known as:  DELTASONE 2 tabs po qd x 5d, then 1 tab po qd x 5d   SYMBICORT 160-4.5 MCG/ACT inhaler Generic drug:  budesonide-formoterol Inhale 2 puffs into the lungs 2 (two) times daily.       All past medical history, surgical history, allergies, family history, immunizations andmedications were updated in the EMR today and reviewed under the history and medication portions of their EMR.     ROS: Negative, with the exception of above mentioned in HPI   Objective:  BP 137/76 (BP Location: Left Arm, Patient Position: Sitting, Cuff Size: Large)   Pulse 71   Temp 98.2 F (36.8 C) (Oral)   Wt 292 lb 12.8 oz (132.8 kg)   LMP 08/10/2010   SpO2 97%   BMI 55.32 kg/m  Body mass index is 55.32 kg/m. Gen: Afebrile. No acute distress. Nontoxic in appearance, well developed, well nourished. Pleasant obese, caucasian female.  HENT: AT. [place]. Bilateral TM visualized WNL. MMM, no oral lesions. Bilateral nares with erythema, no swelling, mild drainage. Throat without erythema or exudates. Cough present, hoarseness present. No TTP sinus.  Eyes:Pupils Equal Round Reactive to light, Extraocular movements intact,  Conjunctiva without redness, discharge or icterus. Neck/lymp/endocrine: Supple,no lymphadenopathy CV: RRR no murmur Chest: Mild wheeze bilateral lung fields, rhonchi present. Mildly decreased air   Neuro:  Normal gait. PERLA. EOMi. Alert. Oriented x3    No exam data present No results found. No results found for this or any previous visit (from the past 24 hour(s)).  Assessment/Plan: [name] is a 66 y.o. female present for OV for  Wheezing/Mild intermittent asthmatic bronchitis without complication - wheezing resolved with duoneb, increase air movement.  - ipratropium-albuterol (DUONEB) 0.5-2.5 (3) MG/3ML nebulizer solution 3  mL Rest, hydrate.  + flonase, mucinex (DM if cough), nettie pot or nasal saline.  Doxy and prednisone prescribed, take until completed.  If cough present it can last up to 6-8 weeks.  F/U 2 weeks of not improved. Sooner if worsening   Reviewed expectations re: course of current medical issues.  Discussed self-management of symptoms.  Outlined signs and symptoms indicating need for more acute intervention.  Patient verbalized understanding and all questions were answered.  Patient received an After-Visit Summary.    No orders of the defined types were placed in this encounter.    Note is dictated utilizing voice recognition software. Although note has been proof read prior to signing, occasional typographical errors still can be missed. If any questions arise, please do not hesitate to call for verification.   electronically signed by:  Howard Pouch, DO  [place]

## 2017-04-11 DIAGNOSIS — G620 Drug-induced polyneuropathy: Secondary | ICD-10-CM

## 2017-04-11 HISTORY — DX: Drug-induced polyneuropathy: G62.0

## 2017-04-21 ENCOUNTER — Ambulatory Visit: Payer: Medicare Other | Admitting: Family Medicine

## 2017-05-08 ENCOUNTER — Other Ambulatory Visit: Payer: Self-pay

## 2017-05-08 MED ORDER — PRAVASTATIN SODIUM 20 MG PO TABS: 20.0000 mg | ORAL_TABLET | Freq: Every day | ORAL | 0 refills | Status: DC

## 2017-05-15 ENCOUNTER — Other Ambulatory Visit: Payer: Self-pay | Admitting: *Deleted

## 2017-05-15 ENCOUNTER — Ambulatory Visit: Payer: Medicare Other | Admitting: Family Medicine

## 2017-05-15 MED ORDER — METFORMIN HCL 500 MG PO TABS: ORAL_TABLET | ORAL | 1 refills | Status: DC

## 2017-05-22 ENCOUNTER — Other Ambulatory Visit: Payer: Self-pay | Admitting: Family Medicine

## 2017-05-25 ENCOUNTER — Other Ambulatory Visit: Payer: Self-pay

## 2017-05-25 MED ORDER — INSULIN ASPART 100 UNIT/ML ~~LOC~~ SOLN: SUBCUTANEOUS | 1 refills | Status: DC

## 2017-06-12 ENCOUNTER — Ambulatory Visit: Payer: Medicare Other | Admitting: Family Medicine

## 2017-06-20 DIAGNOSIS — E113211 Type 2 diabetes mellitus with mild nonproliferative diabetic retinopathy with macular edema, right eye: Secondary | ICD-10-CM | POA: Diagnosis not present

## 2017-06-20 DIAGNOSIS — H2513 Age-related nuclear cataract, bilateral: Secondary | ICD-10-CM | POA: Diagnosis not present

## 2017-06-20 LAB — HM DIABETES EYE EXAM

## 2017-06-28 ENCOUNTER — Ambulatory Visit (INDEPENDENT_AMBULATORY_CARE_PROVIDER_SITE_OTHER): Payer: Medicare Other | Admitting: Family Medicine

## 2017-06-28 ENCOUNTER — Ambulatory Visit (HOSPITAL_BASED_OUTPATIENT_CLINIC_OR_DEPARTMENT_OTHER)
Admission: RE | Admit: 2017-06-28 | Discharge: 2017-06-28 | Disposition: A | Discharge status: Home / Self Care | Payer: Medicare Other | Source: Ambulatory Visit | Attending: Family Medicine | Admitting: Family Medicine

## 2017-06-28 ENCOUNTER — Encounter: Payer: Self-pay | Admitting: Family Medicine

## 2017-06-28 VITALS — BP 169/74 | HR 77 | Resp 18 | Ht 61.0 in | Wt 292.0 lb

## 2017-06-28 DIAGNOSIS — I10 Essential (primary) hypertension: Secondary | ICD-10-CM | POA: Diagnosis not present

## 2017-06-28 DIAGNOSIS — Z1159 Encounter for screening for other viral diseases: Secondary | ICD-10-CM

## 2017-06-28 DIAGNOSIS — M19031 Primary osteoarthritis, right wrist: Secondary | ICD-10-CM | POA: Diagnosis not present

## 2017-06-28 DIAGNOSIS — E118 Type 2 diabetes mellitus with unspecified complications: Secondary | ICD-10-CM | POA: Diagnosis not present

## 2017-06-28 DIAGNOSIS — Z9189 Other specified personal risk factors, not elsewhere classified: Secondary | ICD-10-CM | POA: Diagnosis not present

## 2017-06-28 DIAGNOSIS — M79641 Pain in right hand: Secondary | ICD-10-CM | POA: Insufficient documentation

## 2017-06-28 DIAGNOSIS — J4550 Severe persistent asthma, uncomplicated: Secondary | ICD-10-CM

## 2017-06-28 DIAGNOSIS — M25531 Pain in right wrist: Secondary | ICD-10-CM

## 2017-06-28 DIAGNOSIS — Z23 Encounter for immunization: Secondary | ICD-10-CM | POA: Diagnosis not present

## 2017-06-28 LAB — HEMOGLOBIN A1C: HEMOGLOBIN A1C: 8.7 % — AB (ref 4.6–6.5)

## 2017-06-28 MED ORDER — MELOXICAM 15 MG PO TABS: 15.0000 mg | ORAL_TABLET | Freq: Every day | ORAL | 0 refills | Status: DC

## 2017-06-28 MED ORDER — ALBUTEROL SULFATE (2.5 MG/3ML) 0.083% IN NEBU: 2.5000 mg | INHALATION_SOLUTION | RESPIRATORY_TRACT | 1 refills | Status: DC | PRN

## 2017-06-28 NOTE — Progress Notes (Signed)
OFFICE VISIT  06/28/2017   CC:  Chief Complaint  Patient presents with  . Follow-up    [facility]CI     HPI:    Patient is a 67 y.o. Caucasian female who presents for 4 mo f/u DM 2, HTN, also has R arm and hand pain. Also has severe persistent asthma.  DM 2: compliant with insulins but not checking glucoses at home.  Insulins dosing as per med list are accurate.  HTN: home bp's consistently 130s over 80s or better.  Last 2 weeks or so has had pain in fingers of R hand, extends up through wrist and distal forearm, usually sharp pain.  No tingling/numbness in hands or wrists.  Worse with turning keys, holding bowl. Thumb w/out signif pain.  Seems to be volar surface of wrist mostly + index and middle finger. Has been wearing wrist splint much of the time since pain started.  Has been taking 1 aleve qhs.  Mostly has applied heat and this helps some.  ROS: minimal wheezing lately, no sign  Of acute asthma flare.  No fevers.  No melena, no HAs or dizziness or palpitations.   No myalgias or rashes.  Past Medical History:  Diagnosis Date  . Allergic rhinitis    Allergy testing: results pending as of 05/28/16 (Dr. Harold Hedge)  . Anemia   . Arthritis   . Cellulitis 5/12   Left (Since replacemnt of Left knee  . Chronic renal insufficiency, stage II (mild) 2015  . Complex endometrial hyperplasia with atypia 12/12   Most recent endometrial bx was 10/2012-NEG  . Cough variant asthma   . Diabetes mellitus ([place])   . Encounter for insertion of mirena IUD 5/12  . Fracture, foot 8/14   Hx of left foot stress fracture  . GERD (gastroesophageal reflux disease)   . Hemorrhoids   . Herpes zoster 10/2015   L side belt-line  . History of adenomatous polyp of colon 02/25/13   5 mm cecal polyp removed by Dr. Trevor Mace 5 yrs  . History of blood transfusion   . Hyperlipidemia    Not on statin b/c lipids "stayed down when sugars came down" per pt.  She says Dr. Chalmers Cater knows she is not on statin anymore.   . Hypertension   . Hypothyroidism   . Nephrolithiasis 4/07  . Obesity   . OSA on CPAP   . Osteoarthritis of both knees    Bilat TKA  . Sciatica of left side   . Severe persistent asthma    cough-variant--saw Allergist 05/25/16 and was switched from max dose advair to symbicort.  Marland Kitchen UTI (lower urinary tract infection)   . Zoon's vulvitis    Bx-proven (GYN) -lichen sclerosis.  Clobetasol 0.05% ointment per GYN    Past Surgical History:  Procedure Laterality Date  . ANKLE FRACTURE SURGERY  1984   Pin & repair  . ARTHROSCOPIC REPAIR ACL  2000   Due to ACL tear  . ARTHROSCOPIC REPAIR ACL  5/08  . Blateral knee rerlacements x4 2 times each knee    . BREAST BIOPSY Right 2014   Benign  . CARDIAC CATHETERIZATION  5/07   clear vessel mild mitral   . CARPAL TUNNEL RELEASE Right [id number]   1989 Left  . CHOLECYSTECTOMY  1988  . COLONOSCOPY N/A 02/25/2013   Tubular adenoma x 1: Recall 5 yrs. Procedure: COLONOSCOPY;  Surgeon: Juanita Craver, MD;  Location: [facility];  Service: Endoscopy;  Laterality: N/A;  . COMBINED HYSTEROSCOPY DIAGNOSTIC /  D&C  2/12   Bx neg  . DEXA  08/2007   Bone density normal.  . DILATION AND CURETTAGE OF UTERUS  12/11  . REPLACEMENT TOTAL KNEE Left 5/12   X 2 on each  . TUBAL LIGATION  1985   C-Section    Outpatient Medications Prior to Visit  Medication Sig Dispense Refill  . albuterol (PROVENTIL HFA;VENTOLIN HFA) 108 (90 Base) MCG/ACT inhaler Inhale 2 puffs into lungs every 6 hours as needed for wheezing or shortness of breath 18 Inhaler 3  . aspirin 81 MG tablet Take 81 mg by mouth daily.    . clobetasol ointment (TEMOVATE) 0.05 % APPLY TO AFFECTED AREA TWICE A DAY (Patient taking differently: APPLY TO AFFECTED AREA TWICE A DAY prn) 60 g 1  . enalapril (VASOTEC) 10 MG tablet Take 1 tablet (10 mg total) by mouth daily. 90 tablet 1  . furosemide (LASIX) 20 MG tablet Take 1 tablet (20 mg total) daily by mouth. 90 tablet 1  . IBUPROFEN PO Take by mouth as  needed.     . insulin aspart (NOVOLOG) 100 UNIT/ML injection USE PER SLIDING SCALE AT EACH MEAL (AVG USE IS 17 UNITS TID) 60 mL 1  . insulin NPH Human (NOVOLIN N) 100 UNIT/ML injection 58 U SQ qAM, 58 U SQ mid day, and 55 U SQ q evening 15 vial 1  . Insulin Syringes, Disposable, U-100 0.3 ML MISC Use to inject insulin--6 injections per day 540 each 3  . levocetirizine (XYZAL) 5 MG tablet Take 1 tablet daily as needed by mouth.  5  . levothyroxine (SYNTHROID, LEVOTHROID) 125 MCG tablet TAKE 1 TABLET BY MOUTH EVERY MORNING ON AN EMPTY STOMACH 90 tablet 1  . MELATONIN PO Take by mouth.    . metFORMIN (GLUCOPHAGE) 500 MG tablet TAKE 1 TABLET BY MOUTH EVERY MORNING AND 2 TABLETS BY MOUTH AT BEDTIME 270 tablet 1  . montelukast (SINGULAIR) 10 MG tablet Take 1 tablet by mouth daily.    . pravastatin (PRAVACHOL) 20 MG tablet Take 1 tablet (20 mg total) by mouth daily. 90 tablet 0  . SYMBICORT 160-4.5 MCG/ACT inhaler Inhale 2 puffs into the lungs 2 (two) times daily.    Marland Kitchen doxycycline (VIBRA-TABS) 100 MG tablet Take 1 tablet (100 mg total) by mouth 2 (two) times daily. (Patient not taking: Reported on 06/28/2017) 20 tablet 0  . predniSONE (DELTASONE) 50 MG tablet Take 1 tablet (50 mg total) by mouth daily with breakfast. (Patient not taking: Reported on 06/28/2017) 5 tablet 0   No facility-administered medications prior to visit.     Allergies  Allergen Reactions  . Adhesive [Tape]   . Banana     Nausea and diarrhea  . Eggs Or Egg-Derived Products     Nausea and diarrhea  . Latex     sensativity  . Penicillins Rash  . Pine Swelling and Rash  . Rose Swelling and Rash    ROS As per HPI  PE: Blood pressure (!) 169/74, pulse 77, resp. rate 18, height 5\' 1"  (1.549 m), weight 292 lb (132.5 kg), last menstrual period 08/10/2010, SpO2 96 %. Gen: Alert, well appearing.  Patient is oriented to person, place, time, and situation. AFFECT: pleasant, lucid thought and speech. IEP:PIRJ: no injection,  icteris, swelling, or exudate.  EOMI, PERRLA. Mouth: lips without lesion/swelling.  Oral mucosa pink and moist. Oropharynx without erythema, exudate, or swelling.  CV: RRR, no m/r/g.   LUNGS: CTA bilat, nonlabored resps, good aeration in all  lung fields. EXT: no clubbing, cyanosis, or edema.  Right hand/wrist: wrist with ROM intact but diffusely TTP, as well as some TTP over 2nd and 3rd finger MTP and PIP jts. No erythema or swelling or warmth of joints.  Fingers ROM intact.  No deformity.  LABS:  Lab Results  Component Value Date   TSH 1.49 02/13/2017   Lab Results  Component Value Date   WBC 7.5 08/18/2010   HGB 10.2 (L) 08/18/2010   HCT 30.9 (L) 08/18/2010   MCV 94.5 08/18/2010   PLT 230 08/18/2010   Lab Results  Component Value Date   CREATININE 0.88 02/13/2017   BUN 19 02/13/2017   NA 140 02/13/2017   K 4.4 02/13/2017   CL 102 02/13/2017   CO2 29 02/13/2017   Lab Results  Component Value Date   ALT 20 02/13/2017   AST 21 02/13/2017   ALKPHOS 55 02/13/2017   BILITOT 0.4 02/13/2017   Lab Results  Component Value Date   CHOL 144 02/13/2017   Lab Results  Component Value Date   HDL 31.80 (L) 02/13/2017   Lab Results  Component Value Date   LDLCALC 75 02/13/2017   Lab Results  Component Value Date   TRIG 184.0 (H) 02/13/2017   Lab Results  Component Value Date   CHOLHDL 5 02/13/2017   Lab Results  Component Value Date   HGBA1C 7.9 (H) 02/13/2017    IMPRESSION AND PLAN:  1) Right hand and wrist arthralgias: suspect osteoarthritis. Check R wrist and hand x-rays today. Start meloxicam 15mg , 1 qd x 14d with food.  2) DM 2: control historically not ideal. Noncompliance with home gluc monitoring but compliant with meds. HbA1c today.  Lytes/cr today. Prevnar 13 today.  3) HTN: normal per home bp monitoring. No change in meds today. Lytes/cr today.  4) Severe persistent asthma:  The current medical regimen is effective;  continue present plan and  medications. Home nebulizer machine + supplies rx given today.  5) Preventative health care: prevnar 13 and Hep C screening today.  Spent 40 min with pt today, with >50% of this time spent in counseling and care coordination regarding the above problems.  An After Visit Summary was printed and given to the patient.  FOLLOW UP: 3 mo CPE  Signed:  Crissie Sickles, MD           07/03/2017

## 2017-06-28 NOTE — Patient Instructions (Signed)
Low Back Sprain Rehab  Ask your health care provider which exercises are safe for you. Do exercises exactly as told by your health care provider and adjust them as directed. It is normal to feel mild stretching, pulling, tightness, or discomfort as you do these exercises, but you should stop right away if you feel sudden pain or your pain gets worse. Do not begin these exercises until told by your health care provider.  Stretching and range of motion exercises  These exercises warm up your muscles and joints and improve the movement and flexibility of your back. These exercises also help to relieve pain, numbness, and tingling.  Exercise A: Lumbar rotation    1. Lie on your back on a firm surface and bend your knees.  2. Straighten your arms out to your sides so each arm forms an "L" shape with a side of your body (a 90 degree angle).  3. Slowly move both of your knees to one side of your body until you feel a stretch in your lower back. Try not to let your shoulders move off of the floor.  4. Hold for __________ seconds.  5. Tense your abdominal muscles and slowly move your knees back to the starting position.  6. Repeat this exercise on the other side of your body.  Repeat __________ times. Complete this exercise __________ times a day.  Exercise B: Prone extension on elbows    1. Lie on your abdomen on a firm surface.  2. Prop yourself up on your elbows.  3. Use your arms to help lift your chest up until you feel a gentle stretch in your abdomen and your lower back.  ? This will place some of your body weight on your elbows. If this is uncomfortable, try stacking pillows under your chest.  ? Your hips should stay down, against the surface that you are lying on. Keep your hip and back muscles relaxed.  4. Hold for __________ seconds.  5. Slowly relax your upper body and return to the starting position.  Repeat __________ times. Complete this exercise __________ times a day.  Strengthening exercises  These  exercises build strength and endurance in your back. Endurance is the ability to use your muscles for a long time, even after they get tired.  Exercise C: Pelvic tilt  1. Lie on your back on a firm surface. Bend your knees and keep your feet flat.  2. Tense your abdominal muscles. Tip your pelvis up toward the ceiling and flatten your lower back into the floor.  ? To help with this exercise, you may place a small towel under your lower back and try to push your back into the towel.  3. Hold for __________ seconds.  4. Let your muscles relax completely before you repeat this exercise.  Repeat __________ times. Complete this exercise __________ times a day.  Exercise D: Alternating arm and leg raises    1. Get on your hands and knees on a firm surface. If you are on a hard floor, you may want to use padding to cushion your knees, such as an exercise mat.  2. Line up your arms and legs. Your hands should be below your shoulders, and your knees should be below your hips.  3. Lift your left leg behind you. At the same time, raise your right arm and straighten it in front of you.  ? Do not lift your leg higher than your hip.  ? Do not lift your arm   higher than your shoulder.  ? Keep your abdominal and back muscles tight.  ? Keep your hips facing the ground.  ? Do not arch your back.  ? Keep your balance carefully, and do not hold your breath.  4. Hold for __________ seconds.  5. Slowly return to the starting position and repeat with your right leg and your left arm.  Repeat __________ times. Complete this exercise __________ times a day.  Exercise E: Abdominal set with straight leg raise    1. Lie on your back on a firm surface.  2. Bend one of your knees and keep your other leg straight.  3. Tense your abdominal muscles and lift your straight leg up, 4-6 inches (10-15 cm) off the ground.  4. Keep your abdominal muscles tight and hold for __________ seconds.  ? Do not hold your breath.  ? Do not arch your back. Keep it  flat against the ground.  5. Keep your abdominal muscles tense as you slowly lower your leg back to the starting position.  6. Repeat with your other leg.  Repeat __________ times. Complete this exercise __________ times a day.  Posture and body mechanics    Body mechanics refers to the movements and positions of your body while you do your daily activities. Posture is part of body mechanics. Good posture and healthy body mechanics can help to relieve stress in your body's tissues and joints. Good posture means that your spine is in its natural S-curve position (your spine is neutral), your shoulders are pulled back slightly, and your head is not tipped forward. The following are general guidelines for applying improved posture and body mechanics to your everyday activities.  Standing    · When standing, keep your spine neutral and your feet about hip-width apart. Keep a slight bend in your knees. Your ears, shoulders, and hips should line up.  · When you do a task in which you stand in one place for a long time, place one foot up on a stable object that is 2-4 inches (5-10 cm) high, such as a footstool. This helps keep your spine neutral.  Sitting    · When sitting, keep your spine neutral and keep your feet flat on the floor. Use a footrest, if necessary, and keep your thighs parallel to the floor. Avoid rounding your shoulders, and avoid tilting your head forward.  · When working at a desk or a computer, keep your desk at a height where your hands are slightly lower than your elbows. Slide your chair under your desk so you are close enough to maintain good posture.  · When working at a computer, place your monitor at a height where you are looking straight ahead and you do not have to tilt your head forward or downward to look at the screen.  Resting    · When lying down and resting, avoid positions that are most painful for you.  · If you have pain with activities such as sitting, bending, stooping, or squatting  (flexion-based activities), lie in a position in which your body does not bend very much. For example, avoid curling up on your side with your arms and knees near your chest (fetal position).  · If you have pain with activities such as standing for a long time or reaching with your arms (extension-based activities), lie with your spine in a neutral position and bend your knees slightly. Try the following positions:  · Lying on your side with a   pillow between your knees.  · Lying on your back with a pillow under your knees.  Lifting    · When lifting objects, keep your feet at least shoulder-width apart and tighten your abdominal muscles.  · Bend your knees and hips and keep your spine neutral. It is important to lift using the strength of your legs, not your back. Do not lock your knees straight out.  · Always ask for help to lift heavy or awkward objects.  This information is not intended to replace advice given to you by your health care provider. Make sure you discuss any questions you have with your health care provider.  Document Released: 03/28/2005 Document Revised: 12/03/2015 Document Reviewed: 01/07/2015  Elsevier Interactive Patient Education © 2018 [employer]

## 2017-06-29 ENCOUNTER — Telehealth: Payer: Self-pay | Admitting: Family Medicine

## 2017-06-29 ENCOUNTER — Encounter: Payer: Self-pay | Admitting: Family Medicine

## 2017-06-29 DIAGNOSIS — S6990XA Unspecified injury of unspecified wrist, hand and finger(s), initial encounter: Secondary | ICD-10-CM | POA: Insufficient documentation

## 2017-06-29 LAB — BASIC METABOLIC PANEL
BUN: 18 mg/dL (ref 6–23)
CHLORIDE: 105 meq/L (ref 96–112)
CO2: 27 meq/L (ref 19–32)
Calcium: 9.2 mg/dL (ref 8.4–10.5)
Creatinine, Ser: 0.88 mg/dL (ref 0.40–1.20)
GFR: 68.23 mL/min (ref >60.00)
Glucose, Bld: 90 mg/dL (ref 70–99)
POTASSIUM: 4.3 meq/L (ref 3.5–5.1)
SODIUM: 143 meq/L (ref 135–145)

## 2017-06-29 LAB — HEPATITIS C ANTIBODY
HEP C AB: NONREACTIVE
SIGNAL TO CUT-OFF: 0.02 (ref <1.00)

## 2017-06-29 MED ORDER — ALBUTEROL SULFATE HFA 108 (90 BASE) MCG/ACT IN AERS: INHALATION_SPRAY | RESPIRATORY_TRACT | 3 refills | Status: DC

## 2017-06-29 NOTE — Telephone Encounter (Signed)
Copied from [place] [id number]. Topic: Quick Communication - See Telephone Encounter >> Jun 29, 2017  9:35 AM Ether Griffins B wrote: CRM for notification. See Telephone encounter for: 06/29/17.  [facility] calling on behalf of pt's albuterol (PROVENTIL) (2.5 MG/3ML) 0.083% nebulizer solution. Medicare part D wont cover the medication. They need to send it through Medicare part B. For that they need a new order with diagnosis code on it. [id number] [address], [place] contact number [phone].

## 2017-06-29 NOTE — Telephone Encounter (Signed)
Copied from [place] [id number]. Topic: Quick Communication - See Telephone Encounter >> Jun 29, 2017  2:35 PM Ivar Drape wrote: CRM for notification. See Telephone encounter for: 06/29/17. Patient would like a prescription for the Nebulizer Machine and Supplies to be faxed to [facility].  Fax:  [phone]

## 2017-06-29 NOTE — Telephone Encounter (Signed)
Please advise. Thanks.  

## 2017-06-29 NOTE — Telephone Encounter (Signed)
Rx resent with Dx code J45.50.

## 2017-06-29 NOTE — Telephone Encounter (Signed)
OK, rx written. [place] fax.

## 2017-06-30 ENCOUNTER — Other Ambulatory Visit: Payer: Self-pay

## 2017-06-30 ENCOUNTER — Encounter: Payer: Self-pay | Admitting: Family Medicine

## 2017-06-30 DIAGNOSIS — S6991XA Unspecified injury of right wrist, hand and finger(s), initial encounter: Secondary | ICD-10-CM

## 2017-06-30 NOTE — Telephone Encounter (Signed)
Order faxed to [facility]. Pt advised and voiced understanding.

## 2017-07-03 NOTE — Telephone Encounter (Signed)
Pt states [facility] did not rec[facility] RX with DX code. [facility] to [facility].

## 2017-07-04 ENCOUNTER — Telehealth: Payer: Self-pay | Admitting: Family Medicine

## 2017-07-04 MED ORDER — ALBUTEROL SULFATE (2.5 MG/3ML) 0.083% IN NEBU: 2.5000 mg | INHALATION_SOLUTION | RESPIRATORY_TRACT | 1 refills | Status: AC | PRN

## 2017-07-04 MED ORDER — ALBUTEROL SULFATE HFA 108 (90 BASE) MCG/ACT IN AERS: INHALATION_SPRAY | RESPIRATORY_TRACT | 3 refills | Status: DC

## 2017-07-04 NOTE — Addendum Note (Signed)
Addended by: Onalee Hua on: 07/04/2017 09:49 AM   Modules accepted: Orders

## 2017-07-04 NOTE — Telephone Encounter (Signed)
Copied from [place] [id number]. Topic: Quick Communication - Rx Refill/Question >> Jul 04, 2017  9:45 AM Eulogio Bear J wrote: Medication: albuterol (PROVENTIL) (2.5 MG/3ML) 0.083% nebulizer solution Has the patient contacted their pharmacy? Yes.  Pharm called  (Agent: If no, request that the patient contact the pharmacy for the refill.) Preferred Pharmacy (with phone number [facility] street [facility]  [phone] Agent: Please be advised that RX refills may take up to 3 business days. We ask that you follow-up with your pharmacy.

## 2017-07-04 NOTE — Telephone Encounter (Signed)
Rx resent with Dx code in sig and pharmacy note. Pt advised and voiced understanding.

## 2017-07-04 NOTE — Telephone Encounter (Signed)
Attempted to call pt to let her know RX at pharmacy. Unable to leave VM.

## 2017-07-06 DIAGNOSIS — E113211 Type 2 diabetes mellitus with mild nonproliferative diabetic retinopathy with macular edema, right eye: Secondary | ICD-10-CM | POA: Diagnosis not present

## 2017-07-06 DIAGNOSIS — H35033 Hypertensive retinopathy, bilateral: Secondary | ICD-10-CM | POA: Diagnosis not present

## 2017-07-06 DIAGNOSIS — H43393 Other vitreous opacities, bilateral: Secondary | ICD-10-CM | POA: Diagnosis not present

## 2017-07-06 DIAGNOSIS — H2513 Age-related nuclear cataract, bilateral: Secondary | ICD-10-CM | POA: Diagnosis not present

## 2017-07-12 ENCOUNTER — Other Ambulatory Visit: Payer: Self-pay | Admitting: Family Medicine

## 2017-07-12 MED ORDER — INSULIN NPH (HUMAN) (ISOPHANE) 100 UNIT/ML ~~LOC~~ SUSP: SUBCUTANEOUS | 1 refills | Status: DC

## 2017-07-12 NOTE — Telephone Encounter (Signed)
Pts NPH insulin sig updated to match most recent change done on 06/28/17 (see lab note).

## 2017-07-13 DIAGNOSIS — M25531 Pain in right wrist: Secondary | ICD-10-CM | POA: Diagnosis not present

## 2017-08-09 ENCOUNTER — Encounter: Payer: Self-pay | Admitting: Family Medicine

## 2017-08-09 ENCOUNTER — Other Ambulatory Visit: Payer: Self-pay | Admitting: Family Medicine

## 2017-08-09 NOTE — Telephone Encounter (Signed)
Please advise. Thanks.  

## 2017-08-10 MED ORDER — MELOXICAM 15 MG PO TABS: ORAL_TABLET | ORAL | 3 refills | Status: DC

## 2017-08-23 DIAGNOSIS — M13839 Other specified arthritis, unspecified wrist: Secondary | ICD-10-CM | POA: Diagnosis not present

## 2017-08-23 DIAGNOSIS — M13841 Other specified arthritis, right hand: Secondary | ICD-10-CM | POA: Diagnosis not present

## 2017-08-23 DIAGNOSIS — M25531 Pain in right wrist: Secondary | ICD-10-CM | POA: Diagnosis not present

## 2017-08-31 DIAGNOSIS — H43393 Other vitreous opacities, bilateral: Secondary | ICD-10-CM | POA: Diagnosis not present

## 2017-08-31 DIAGNOSIS — E113292 Type 2 diabetes mellitus with mild nonproliferative diabetic retinopathy without macular edema, left eye: Secondary | ICD-10-CM | POA: Diagnosis not present

## 2017-08-31 DIAGNOSIS — H2513 Age-related nuclear cataract, bilateral: Secondary | ICD-10-CM | POA: Diagnosis not present

## 2017-08-31 DIAGNOSIS — H35033 Hypertensive retinopathy, bilateral: Secondary | ICD-10-CM | POA: Diagnosis not present

## 2017-08-31 DIAGNOSIS — E113211 Type 2 diabetes mellitus with mild nonproliferative diabetic retinopathy with macular edema, right eye: Secondary | ICD-10-CM | POA: Diagnosis not present

## 2017-09-11 ENCOUNTER — Encounter: Payer: Self-pay | Admitting: Family Medicine

## 2017-09-11 ENCOUNTER — Ambulatory Visit (INDEPENDENT_AMBULATORY_CARE_PROVIDER_SITE_OTHER): Payer: Medicare Other | Admitting: Family Medicine

## 2017-09-11 VITALS — BP 128/66 | HR 95 | Temp 98.2°F | Resp 16 | Ht 61.0 in | Wt 293.0 lb

## 2017-09-11 DIAGNOSIS — L03115 Cellulitis of right lower limb: Secondary | ICD-10-CM | POA: Diagnosis not present

## 2017-09-11 MED ORDER — CLINDAMYCIN HCL 300 MG PO CAPS: 300.0000 mg | ORAL_CAPSULE | Freq: Three times a day (TID) | ORAL | 0 refills | Status: DC

## 2017-09-11 NOTE — Progress Notes (Signed)
OFFICE VISIT  09/11/2017   CC:  Chief Complaint  Patient presents with  . Cellulitis    ? lower right leg   HPI:    Patient is a 67 y.o. Caucasian female with poorly controlled DM 2 who presents for right leg complaint. About 9 day history of right lower leg/foot red discoloration, area getting better, has an "itchy pain" sensation.  OTC hydrocortisone helped a little.  Warm epsom salt soaks, neosporin helped a little. Has hx of LL cellulitis in the past. No n/v.  No malaise or fever.    Past Medical History:  Diagnosis Date  . Allergic rhinitis    Allergy testing: results pending as of 05/28/16 (Dr. Harold Hedge)  . Anemia   . Arthritis   . Cellulitis 5/12   Left (Since replacemnt of Left knee  . Chronic renal insufficiency, stage II (mild) 2015  . Complex endometrial hyperplasia with atypia 12/12   Most recent endometrial bx was 10/2012-NEG  . Cough variant asthma   . Diabetes mellitus ([place])   . Encounter for insertion of mirena IUD 5/12  . Fracture, foot 8/14   Hx of left foot stress fracture  . GERD (gastroesophageal reflux disease)   . Hemorrhoids   . Herpes zoster 10/2015   L side belt-line  . History of adenomatous polyp of colon 02/25/13   5 mm cecal polyp removed by Dr. Trevor Mace 5 yrs  . History of blood transfusion   . Hyperlipidemia    Not on statin b/c lipids "stayed down when sugars came down" per pt.  She says Dr. Chalmers Cater knows she is not on statin anymore.  . Hypertension   . Hypothyroidism   . Nephrolithiasis 4/07  . Obesity   . OSA on CPAP   . Osteoarthritis of both knees    Bilat TKA  . Osteoarthritis of right wrist    + ? scapholunate ligament disruption (x-ray 06/2017)--ortho referral.  . Sciatica of left side   . Severe persistent asthma    cough-variant--saw Allergist 05/25/16 and was switched from max dose advair to symbicort.  Marland Kitchen UTI (lower urinary tract infection)   . Zoon's vulvitis    Bx-proven (GYN) -lichen sclerosis.  Clobetasol 0.05%  ointment per GYN    Past Surgical History:  Procedure Laterality Date  . ANKLE FRACTURE SURGERY  1984   Pin & repair  . ARTHROSCOPIC REPAIR ACL  2000   Due to ACL tear  . ARTHROSCOPIC REPAIR ACL  5/08  . Blateral knee rerlacements x4 2 times each knee    . BREAST BIOPSY Right 2014   Benign  . CARDIAC CATHETERIZATION  5/07   clear vessel mild mitral   . CARPAL TUNNEL RELEASE Right [id number]   1989 Left  . CHOLECYSTECTOMY  1988  . COLONOSCOPY N/A 02/25/2013   Tubular adenoma x 1: Recall 5 yrs. Procedure: COLONOSCOPY;  Surgeon: Juanita Craver, MD;  Location: [facility];  Service: Endoscopy;  Laterality: N/A;  . COMBINED HYSTEROSCOPY DIAGNOSTIC / D&C  2/12   Bx neg  . DEXA  08/2007   Bone density normal.  . DILATION AND CURETTAGE OF UTERUS  12/11  . REPLACEMENT TOTAL KNEE Left 5/12   X 2 on each  . TUBAL LIGATION  1985   C-Section    Outpatient Medications Prior to Visit  Medication Sig Dispense Refill  . albuterol (PROVENTIL HFA;VENTOLIN HFA) 108 (90 Base) MCG/ACT inhaler Inhale 2 puffs into lungs every 6 hours as needed for wheezing or shortness of  breath Dx: J45.50 18 Inhaler 3  . albuterol (PROVENTIL) (2.5 MG/3ML) 0.083% nebulizer solution Take 3 mLs (2.5 mg total) by nebulization every 4 (four) hours as needed for wheezing or shortness of breath (Dx: J45.50). 75 mL 1  . aspirin 81 MG tablet Take 81 mg by mouth daily.    . clobetasol ointment (TEMOVATE) 0.05 % APPLY TO AFFECTED AREA TWICE A DAY (Patient taking differently: APPLY TO AFFECTED AREA TWICE A DAY prn) 60 g 1  . enalapril (VASOTEC) 10 MG tablet Take 1 tablet (10 mg total) by mouth daily. 90 tablet 1  . furosemide (LASIX) 20 MG tablet Take 1 tablet (20 mg total) daily by mouth. 90 tablet 1  . IBUPROFEN PO Take by mouth as needed.     . insulin aspart (NOVOLOG) 100 UNIT/ML injection USE PER SLIDING SCALE AT EACH MEAL (AVG USE IS 17 UNITS TID) 60 mL 1  . insulin NPH Human (NOVOLIN N) 100 UNIT/ML injection 60 U SQ qAM,  60 U SQ mid day, and 59 U SQ q evening 15 vial 1  . Insulin Syringes, Disposable, U-100 0.3 ML MISC Use to inject insulin--6 injections per day 540 each 3  . levocetirizine (XYZAL) 5 MG tablet Take 1 tablet daily as needed by mouth.  5  . levothyroxine (SYNTHROID, LEVOTHROID) 125 MCG tablet TAKE 1 TABLET BY MOUTH EVERY MORNING ON AN EMPTY STOMACH 90 tablet 1  . MELATONIN PO Take by mouth.    . meloxicam (MOBIC) 15 MG tablet 1 tab po qd prn 30 tablet 3  . metFORMIN (GLUCOPHAGE) 500 MG tablet TAKE 1 TABLET BY MOUTH EVERY MORNING AND 2 TABLETS BY MOUTH AT BEDTIME 270 tablet 1  . montelukast (SINGULAIR) 10 MG tablet Take 1 tablet by mouth daily.    . pravastatin (PRAVACHOL) 20 MG tablet TAKE 1 TABLET BY MOUTH EVERY DAY 90 tablet 2  . SYMBICORT 160-4.5 MCG/ACT inhaler Inhale 2 puffs into the lungs 2 (two) times daily.     No facility-administered medications prior to visit.     Allergies  Allergen Reactions  . Adhesive [Tape]   . Banana     Nausea and diarrhea  . Eggs Or Egg-Derived Products     Nausea and diarrhea  . Latex     sensativity  . Penicillins Rash  . Pine Swelling and Rash  . Rose Swelling and Rash    ROS As per HPI  PE: Blood pressure 128/66, pulse 95, temperature 98.2 F (36.8 C), temperature source Oral, resp. rate 16, height 5\' 1"  (1.549 m), weight 293 lb (132.9 kg), last menstrual period 08/10/2010, SpO2 94 %. Gen: Alert, well appearing.  Patient is oriented to person, place, time, and situation. AFFECT: pleasant, lucid thought and speech. Right LL anterior aspect with splotchy, deep pink-to-erythematous rash, one small vesicle noted. +Blanchable, borders not well demarcated.  No foot involvement.  Mildly TTP over the erythema, + warmth. 2-3+ bilat LL pitting edema. LABS:  Lab Results  Component Value Date   WBC 7.5 08/18/2010   HGB 10.2 (L) 08/18/2010   HCT 30.9 (L) 08/18/2010   MCV 94.5 08/18/2010   PLT 230 08/18/2010     Chemistry      Component Value  Date/Time   NA 143 06/28/2017 1452   K 4.3 06/28/2017 1452   CL 105 06/28/2017 1452   CO2 27 06/28/2017 1452   BUN 18 06/28/2017 1452   CREATININE 0.88 06/28/2017 1452      Component Value Date/Time  CALCIUM 9.2 06/28/2017 1452   ALKPHOS 55 02/13/2017 1121   AST 21 02/13/2017 1121   ALT 20 02/13/2017 1121   BILITOT 0.4 02/13/2017 1121     Lab Results  Component Value Date   HGBA1C 8.7 (H) 06/28/2017     IMPRESSION AND PLAN:  Right LL cellulitis.  Could be a component of venous stasis dermatitis but will hold off on any treatment for that while we see how well it improves with antibiotic alone. Clindamycin 300 mg tid x 10d. Therapeutic expectations and side effect profile of medication discussed today.  Patient's questions answered. Tylenol for pain. Continue epsom salt soak 20-30 min daily, otherwise keep area dry.  An After Visit Summary was printed and given to the patient.  FOLLOW UP: Return in about 1 week (around 09/18/2017) for f/u cellulitis.  Signed:  Crissie Sickles, MD           09/11/2017

## 2017-09-18 ENCOUNTER — Encounter: Payer: Self-pay | Admitting: Family Medicine

## 2017-09-18 ENCOUNTER — Ambulatory Visit (INDEPENDENT_AMBULATORY_CARE_PROVIDER_SITE_OTHER): Payer: Medicare Other | Admitting: Family Medicine

## 2017-09-18 VITALS — BP 140/60 | HR 78 | Temp 98.2°F | Resp 16 | Ht 61.0 in | Wt 293.5 lb

## 2017-09-18 DIAGNOSIS — I872 Venous insufficiency (chronic) (peripheral): Secondary | ICD-10-CM | POA: Diagnosis not present

## 2017-09-18 DIAGNOSIS — L03115 Cellulitis of right lower limb: Secondary | ICD-10-CM

## 2017-09-18 MED ORDER — FLUTICASONE PROPIONATE 0.05 % EX CREA: TOPICAL_CREAM | CUTANEOUS | 0 refills | Status: DC

## 2017-09-18 MED ORDER — CLINDAMYCIN HCL 300 MG PO CAPS: 300.0000 mg | ORAL_CAPSULE | Freq: Three times a day (TID) | ORAL | 0 refills | Status: DC

## 2017-09-18 NOTE — Progress Notes (Signed)
OFFICE VISIT  09/18/2017   CC:  Chief Complaint  Patient presents with  . Follow-up    cellulitis   HPI:    Patient is a 67 y.o. Caucasian female who presents for 6 day f/u R cellulitis of R LL anterior aspect. I put her on clinda 300 mg tid x 10d. Says leg feels improved; not as much "itchy, tingly pain".  Clinda causes some loose BMs but no abd pain or nausea. Eating activia more lately, says this helps her diarrhea. Leg redness has improved.  No f/c/malaise.   BP: home usually 093 avg systolic.  Her R hand is hurting her more today.  Interim HX:   Past Medical History:  Diagnosis Date  . Allergic rhinitis    Allergy testing: results pending as of 05/28/16 (Dr. Harold Hedge)  . Anemia   . Arthritis   . Cellulitis 5/12   Left (Since replacemnt of Left knee  . Chronic renal insufficiency, stage II (mild) 2015  . Complex endometrial hyperplasia with atypia 12/12   Most recent endometrial bx was 10/2012-NEG  . Cough variant asthma   . Diabetes mellitus ([place])   . Encounter for insertion of mirena IUD 5/12  . Fracture, foot 8/14   Hx of left foot stress fracture  . GERD (gastroesophageal reflux disease)   . Hemorrhoids   . Herpes zoster 10/2015   L side belt-line  . History of adenomatous polyp of colon 02/25/13   5 mm cecal polyp removed by Dr. Trevor Mace 5 yrs  . History of blood transfusion   . Hyperlipidemia    Not on statin b/c lipids "stayed down when sugars came down" per pt.  She says Dr. Chalmers Cater knows she is not on statin anymore.  . Hypertension   . Hypothyroidism   . Nephrolithiasis 4/07  . Obesity   . OSA on CPAP   . Osteoarthritis of both knees    Bilat TKA  . Osteoarthritis of right wrist    + ? scapholunate ligament disruption (x-ray 06/2017)--ortho referral.  . Sciatica of left side   . Severe persistent asthma    cough-variant--saw Allergist 05/25/16 and was switched from max dose advair to symbicort.  Marland Kitchen UTI (lower urinary tract infection)   . Zoon's  vulvitis    Bx-proven (GYN) -lichen sclerosis.  Clobetasol 0.05% ointment per GYN    Past Surgical History:  Procedure Laterality Date  . ANKLE FRACTURE SURGERY  1984   Pin & repair  . ARTHROSCOPIC REPAIR ACL  2000   Due to ACL tear  . ARTHROSCOPIC REPAIR ACL  5/08  . Blateral knee rerlacements x4 2 times each knee    . BREAST BIOPSY Right 2014   Benign  . CARDIAC CATHETERIZATION  5/07   clear vessel mild mitral   . CARPAL TUNNEL RELEASE Right [id number]   1989 Left  . CHOLECYSTECTOMY  1988  . COLONOSCOPY N/A 02/25/2013   Tubular adenoma x 1: Recall 5 yrs. Procedure: COLONOSCOPY;  Surgeon: Juanita Craver, MD;  Location: [facility];  Service: Endoscopy;  Laterality: N/A;  . COMBINED HYSTEROSCOPY DIAGNOSTIC / D&C  2/12   Bx neg  . DEXA  08/2007   Bone density normal.  . DILATION AND CURETTAGE OF UTERUS  12/11  . REPLACEMENT TOTAL KNEE Left 5/12   X 2 on each  . TUBAL LIGATION  1985   C-Section    Outpatient Medications Prior to Visit  Medication Sig Dispense Refill  . albuterol (PROVENTIL HFA;VENTOLIN HFA) 108 (90  Base) MCG/ACT inhaler Inhale 2 puffs into lungs every 6 hours as needed for wheezing or shortness of breath Dx: J45.50 18 Inhaler 3  . albuterol (PROVENTIL) (2.5 MG/3ML) 0.083% nebulizer solution Take 3 mLs (2.5 mg total) by nebulization every 4 (four) hours as needed for wheezing or shortness of breath (Dx: J45.50). 75 mL 1  . aspirin 81 MG tablet Take 81 mg by mouth daily.    . clobetasol ointment (TEMOVATE) 0.05 % APPLY TO AFFECTED AREA TWICE A DAY (Patient taking differently: APPLY TO AFFECTED AREA TWICE A DAY prn) 60 g 1  . enalapril (VASOTEC) 10 MG tablet Take 1 tablet (10 mg total) by mouth daily. 90 tablet 1  . furosemide (LASIX) 20 MG tablet Take 1 tablet (20 mg total) daily by mouth. 90 tablet 1  . IBUPROFEN PO Take by mouth as needed.     . insulin aspart (NOVOLOG) 100 UNIT/ML injection USE PER SLIDING SCALE AT EACH MEAL (AVG USE IS 17 UNITS TID) 60 mL 1  .  insulin NPH Human (NOVOLIN N) 100 UNIT/ML injection 60 U SQ qAM, 60 U SQ mid day, and 59 U SQ q evening 15 vial 1  . Insulin Syringes, Disposable, U-100 0.3 ML MISC Use to inject insulin--6 injections per day 540 each 3  . levocetirizine (XYZAL) 5 MG tablet Take 1 tablet daily as needed by mouth.  5  . levothyroxine (SYNTHROID, LEVOTHROID) 125 MCG tablet TAKE 1 TABLET BY MOUTH EVERY MORNING ON AN EMPTY STOMACH 90 tablet 1  . MELATONIN PO Take by mouth.    . meloxicam (MOBIC) 15 MG tablet 1 tab po qd prn 30 tablet 3  . metFORMIN (GLUCOPHAGE) 500 MG tablet TAKE 1 TABLET BY MOUTH EVERY MORNING AND 2 TABLETS BY MOUTH AT BEDTIME 270 tablet 1  . montelukast (SINGULAIR) 10 MG tablet Take 1 tablet by mouth daily.    . pravastatin (PRAVACHOL) 20 MG tablet TAKE 1 TABLET BY MOUTH EVERY DAY 90 tablet 2  . SYMBICORT 160-4.5 MCG/ACT inhaler Inhale 2 puffs into the lungs 2 (two) times daily.    . clindamycin (CLEOCIN) 300 MG capsule Take 1 capsule (300 mg total) by mouth 3 (three) times daily. 30 capsule 0   No facility-administered medications prior to visit.     Allergies  Allergen Reactions  . Adhesive [Tape]   . Banana     Nausea and diarrhea  . Eggs Or Egg-Derived Products     Nausea and diarrhea  . Latex     sensativity  . Penicillins Rash  . Pine Swelling and Rash  . Rose Swelling and Rash    ROS As per HPI  PE: Repeat bp today: 140/60 Blood pressure 140/60, pulse 78, temperature 98.2 F (36.8 C), temperature source Oral, resp. rate 16, height 5\' 1"  (1.549 m), weight 293 lb 8 oz (133.1 kg), last menstrual period 08/10/2010, SpO2 97 %. Gen: Alert, well appearing.  Patient is oriented to person, place, time, and situation. AFFECT: pleasant, lucid thought and speech. R lower leg with pinkish macular rash from a couple inches below the knee down into ankle, all in anterior tibial region. No signif erythema, no signif tenderness, no streaking, no vesicles, no pustules.  Macular area is well  demarcated, just a bit deeper pink in periphery of the rash. 3+ pitting edema bilat.  LABS:  Lab Results  Component Value Date   HGBA1C 8.7 (H) 06/28/2017     Chemistry      Component Value Date/Time  NA 143 06/28/2017 1452   K 4.3 06/28/2017 1452   CL 105 06/28/2017 1452   CO2 27 06/28/2017 1452   BUN 18 06/28/2017 1452   CREATININE 0.88 06/28/2017 1452      Component Value Date/Time   CALCIUM 9.2 06/28/2017 1452   ALKPHOS 55 02/13/2017 1121   AST 21 02/13/2017 1121   ALT 20 02/13/2017 1121   BILITOT 0.4 02/13/2017 1121      IMPRESSION AND PLAN:  1) R LL cellulitis + venous stasis dermatitis: improved with clinda. Will extend abx 5 more days. Also, add cutivate cream bid to affected area. Elevate legs and continue 20mg  lasix qd for chronic LL edema. BP control ok per home monitoring and recheck bp here today.  An After Visit Summary was printed and given to the patient.  FOLLOW UP: Return for keep appt set for 10/04/17.  Signed:  Crissie Sickles, MD           09/18/2017

## 2017-09-23 ENCOUNTER — Other Ambulatory Visit: Payer: Self-pay | Admitting: Family Medicine

## 2017-10-04 ENCOUNTER — Ambulatory Visit (HOSPITAL_BASED_OUTPATIENT_CLINIC_OR_DEPARTMENT_OTHER)
Admission: RE | Admit: 2017-10-04 | Discharge: 2017-10-04 | Disposition: A | Discharge status: Home / Self Care | Payer: Medicare Other | Source: Ambulatory Visit | Attending: Family Medicine | Admitting: Family Medicine

## 2017-10-04 ENCOUNTER — Ambulatory Visit (INDEPENDENT_AMBULATORY_CARE_PROVIDER_SITE_OTHER): Payer: Medicare Other | Admitting: Family Medicine

## 2017-10-04 ENCOUNTER — Encounter: Payer: Self-pay | Admitting: Family Medicine

## 2017-10-04 VITALS — BP 155/78 | HR 72 | Temp 97.6°F | Resp 16 | Ht 61.0 in | Wt 293.0 lb

## 2017-10-04 DIAGNOSIS — M21961 Unspecified acquired deformity of right lower leg: Secondary | ICD-10-CM | POA: Insufficient documentation

## 2017-10-04 DIAGNOSIS — I10 Essential (primary) hypertension: Secondary | ICD-10-CM

## 2017-10-04 DIAGNOSIS — M4317 Spondylolisthesis, lumbosacral region: Secondary | ICD-10-CM | POA: Diagnosis not present

## 2017-10-04 DIAGNOSIS — I739 Peripheral vascular disease, unspecified: Secondary | ICD-10-CM | POA: Diagnosis not present

## 2017-10-04 DIAGNOSIS — M5442 Lumbago with sciatica, left side: Secondary | ICD-10-CM | POA: Diagnosis not present

## 2017-10-04 DIAGNOSIS — G8929 Other chronic pain: Secondary | ICD-10-CM | POA: Diagnosis not present

## 2017-10-04 DIAGNOSIS — M545 Low back pain: Secondary | ICD-10-CM | POA: Diagnosis not present

## 2017-10-04 DIAGNOSIS — M5136 Other intervertebral disc degeneration, lumbar region: Secondary | ICD-10-CM | POA: Insufficient documentation

## 2017-10-04 DIAGNOSIS — M19071 Primary osteoarthritis, right ankle and foot: Secondary | ICD-10-CM | POA: Diagnosis not present

## 2017-10-04 DIAGNOSIS — D649 Anemia, unspecified: Secondary | ICD-10-CM | POA: Diagnosis not present

## 2017-10-04 DIAGNOSIS — M79671 Pain in right foot: Secondary | ICD-10-CM

## 2017-10-04 DIAGNOSIS — E1165 Type 2 diabetes mellitus with hyperglycemia: Secondary | ICD-10-CM | POA: Diagnosis not present

## 2017-10-04 DIAGNOSIS — E118 Type 2 diabetes mellitus with unspecified complications: Secondary | ICD-10-CM

## 2017-10-04 DIAGNOSIS — Z794 Long term (current) use of insulin: Secondary | ICD-10-CM | POA: Diagnosis not present

## 2017-10-04 LAB — CBC WITH DIFFERENTIAL/PLATELET
BASOS ABS: 0.1 10*3/uL (ref 0.0–0.1)
Basophils Relative: 0.7 % (ref 0.0–3.0)
EOS ABS: 1 10*3/uL — AB (ref 0.0–0.7)
Eosinophils Relative: 11.9 % — ABNORMAL HIGH (ref 0.0–5.0)
HCT: 35.2 % — ABNORMAL LOW (ref 36.0–46.0)
Hemoglobin: 11.7 g/dL — ABNORMAL LOW (ref 12.0–15.0)
LYMPHS ABS: 1.4 10*3/uL (ref 0.7–4.0)
Lymphocytes Relative: 16.9 % (ref 12.0–46.0)
MCHC: 33.3 g/dL (ref 30.0–36.0)
MCV: 91.8 fl (ref 78.0–100.0)
MONO ABS: 0.6 10*3/uL (ref 0.1–1.0)
MONOS PCT: 7.9 % (ref 3.0–12.0)
NEUTROS ABS: 5 10*3/uL (ref 1.4–7.7)
NEUTROS PCT: 62.6 % (ref 43.0–77.0)
PLATELETS: 251 10*3/uL (ref 150.0–400.0)
RBC: 3.83 Mil/uL — ABNORMAL LOW (ref 3.87–5.11)
RDW: 15.3 % (ref 11.5–15.5)
WBC: 8 10*3/uL (ref 4.0–10.5)

## 2017-10-04 LAB — BASIC METABOLIC PANEL
BUN: 29 mg/dL — ABNORMAL HIGH (ref 6–23)
CALCIUM: 9.8 mg/dL (ref 8.4–10.5)
CO2: 28 mEq/L (ref 19–32)
Chloride: 105 mEq/L (ref 96–112)
Creatinine, Ser: 0.89 mg/dL (ref 0.40–1.20)
GFR: 67.29 mL/min (ref >60.00)
Glucose, Bld: 106 mg/dL — ABNORMAL HIGH (ref 70–99)
Potassium: 4.7 mEq/L (ref 3.5–5.1)
Sodium: 141 mEq/L (ref 135–145)

## 2017-10-04 LAB — HEMOGLOBIN A1C: HEMOGLOBIN A1C: 9.4 % — AB (ref 4.6–6.5)

## 2017-10-04 MED ORDER — FLUTICASONE PROPIONATE 0.05 % EX CREA: TOPICAL_CREAM | CUTANEOUS | 2 refills | Status: DC

## 2017-10-04 MED ORDER — HYDROCODONE-ACETAMINOPHEN 5-325 MG PO TABS: 1.0000 | ORAL_TABLET | Freq: Two times a day (BID) | ORAL | 0 refills | Status: DC | PRN

## 2017-10-04 NOTE — Progress Notes (Signed)
OFFICE VISIT  10/04/2017   CC:  Chief Complaint  Patient presents with  . Follow-up    RCI   HPI:    Patient is a 67 y.o. Caucasian female who presents for 3 mo f/u DM 2, HTN, hypercholesterolemia. She also has severe persistent asthma Last f/u visit: Hba1c was increased from 7.9% to 8.7%. I recommend she increase NPH insulin to 60 U at BF, 60 U at mid-day, and 56 U in evenings. Continue current sliding scale dosing with mealtime novolog. BMET was normal.  BP: usually 130s/70s at home.   DM 2:  Has not checked glucoses in last couple of weeks. Says diet is "about the same"---good days and bad days. Denies excessive thirst or urination.  Feet: achy but no burning, tingling, numbness. HLD: tolerating statin fine.  Recent Hx of cellulitis:  R lower leg not itchy or painful.  Still just a bit light pink discoloration---finished clinda and has been using topical steroid.  Anniversary of son's death 49 yrs ago.  He had tetralogy of fallot.  She is having trouble with sadness about this currently/lately.  Other than this, she denies persistent   R wrist update: ortho saw her, said she was missing bone in R wrist, was given steroid shot in joint. Ortho MD sent her to another ortho specialist and he recommended against surgery b/c of her poor overall health/high risk surgical candidate and also no promise it would fix the pain. Intense pain: uses ice, TENS unit.  Takes mobic qd, no opioids. Also has chronic LBP that has been more severe lately, esp when walking.  No recent trauma or strain. Pain does radiate down L leg to knee level, no signif tingling or numbness. Right foot hurts on top lately--near anterior aspect of ankle.  No swelling or erythema, no injury.   Past Medical History:  Diagnosis Date  . Allergic rhinitis    Allergy testing: results pending as of 05/28/16 (Dr. Harold Hedge)  . Anemia   . Arthritis   . Cellulitis 5/12   Left (Since replacemnt of Left knee  . Chronic  renal insufficiency, stage II (mild) 2015  . Complex endometrial hyperplasia with atypia 12/12   Most recent endometrial bx was 10/2012-NEG  . Cough variant asthma   . Diabetes mellitus ([place])   . Encounter for insertion of mirena IUD 5/12  . Fracture, foot 8/14   Hx of left foot stress fracture  . GERD (gastroesophageal reflux disease)   . Hemorrhoids   . Herpes zoster 10/2015   L side belt-line  . History of adenomatous polyp of colon 02/25/13   5 mm cecal polyp removed by Dr. Trevor Mace 5 yrs  . History of blood transfusion   . Hyperlipidemia    Not on statin b/c lipids "stayed down when sugars came down" per pt.  She says Dr. Chalmers Cater knows she is not on statin anymore.  . Hypertension   . Hypothyroidism   . Nephrolithiasis 4/07  . Obesity   . OSA on CPAP   . Osteoarthritis of both knees    Bilat TKA  . Osteoarthritis of right wrist    + ? scapholunate ligament disruption (x-ray 06/2017)--ortho referral.  . Sciatica of left side   . Severe persistent asthma    cough-variant--saw Allergist 05/25/16 and was switched from max dose advair to symbicort.  . Systolic murmur    ECHO fine 10/2016-->murmur likely flow murmur assoc with HTN.  Marland Kitchen UTI (lower urinary tract infection)   .  Zoon's vulvitis    Bx-proven (GYN) -lichen sclerosis.  Clobetasol 0.05% ointment per GYN    Past Surgical History:  Procedure Laterality Date  . ANKLE FRACTURE SURGERY  1984   Pin & repair  . ARTHROSCOPIC REPAIR ACL  2000   Due to ACL tear  . ARTHROSCOPIC REPAIR ACL  5/08  . Blateral knee rerlacements x4 2 times each knee    . BREAST BIOPSY Right 2014   Benign  . CARDIAC CATHETERIZATION  5/07   clear vessel mild mitral   . CARPAL TUNNEL RELEASE Right [id number]   1989 Left  . CHOLECYSTECTOMY  1988  . COLONOSCOPY N/A 02/25/2013   Tubular adenoma x 1: Recall 5 yrs. Procedure: COLONOSCOPY;  Surgeon: Juanita Craver, MD;  Location: [facility];  Service: Endoscopy;  Laterality: N/A;  . COMBINED  HYSTEROSCOPY DIAGNOSTIC / D&C  2/12   Bx neg  . DEXA  08/2007   Bone density normal.  . DILATION AND CURETTAGE OF UTERUS  12/11  . REPLACEMENT TOTAL KNEE Left 5/12   X 2 on each  . TRANSTHORACIC ECHOCARDIOGRAM  10/11/2016    EF 55-60%, grd I DD.  . TUBAL LIGATION  1985   C-Section    Outpatient Medications Prior to Visit  Medication Sig Dispense Refill  . albuterol (PROVENTIL HFA;VENTOLIN HFA) 108 (90 Base) MCG/ACT inhaler Inhale 2 puffs into lungs every 6 hours as needed for wheezing or shortness of breath Dx: J45.50 18 Inhaler 3  . albuterol (PROVENTIL) (2.5 MG/3ML) 0.083% nebulizer solution Take 3 mLs (2.5 mg total) by nebulization every 4 (four) hours as needed for wheezing or shortness of breath (Dx: J45.50). 75 mL 1  . aspirin 81 MG tablet Take 81 mg by mouth daily.    . clobetasol ointment (TEMOVATE) 0.05 % APPLY TO AFFECTED AREA TWICE A DAY (Patient taking differently: APPLY TO AFFECTED AREA TWICE A DAY prn) 60 g 1  . enalapril (VASOTEC) 10 MG tablet Take 1 tablet (10 mg total) by mouth daily. 90 tablet 1  . fluticasone (CUTIVATE) 0.05 % cream Apply to affected area of R lower leg twice per day 30 g 0  . furosemide (LASIX) 20 MG tablet Take 1 tablet (20 mg total) daily by mouth. 90 tablet 1  . IBUPROFEN PO Take by mouth as needed.     . insulin aspart (NOVOLOG) 100 UNIT/ML injection USE PER SLIDING SCALE AT EACH MEAL (AVG USE IS 17 UNITS TID) 60 mL 1  . insulin NPH Human (NOVOLIN N) 100 UNIT/ML injection 60 U SQ qAM, 60 U SQ mid day, and 59 U SQ q evening 15 vial 1  . Insulin Syringes, Disposable, U-100 0.3 ML MISC Use to inject insulin--6 injections per day 540 each 3  . levocetirizine (XYZAL) 5 MG tablet Take 1 tablet daily as needed by mouth.  5  . levothyroxine (SYNTHROID, LEVOTHROID) 125 MCG tablet TAKE 1 TABLET BY MOUTH EVERY MORNING ON AN EMPTY STOMACH 90 tablet 1  . MELATONIN PO Take by mouth.    . meloxicam (MOBIC) 15 MG tablet 1 tab po qd prn 30 tablet 3  . metFORMIN  (GLUCOPHAGE) 500 MG tablet TAKE 1 TABLET BY MOUTH EVERY MORNING AND 2 TABLETS BY MOUTH AT BEDTIME 270 tablet 1  . montelukast (SINGULAIR) 10 MG tablet Take 1 tablet by mouth daily.    . pravastatin (PRAVACHOL) 20 MG tablet TAKE 1 TABLET BY MOUTH EVERY DAY 90 tablet 2  . SYMBICORT 160-4.5 MCG/ACT inhaler Inhale 2 puffs into  the lungs 2 (two) times daily.    . clindamycin (CLEOCIN) 300 MG capsule Take 1 capsule (300 mg total) by mouth 3 (three) times daily. (Patient not taking: Reported on 10/04/2017) 15 capsule 0   No facility-administered medications prior to visit.     Allergies  Allergen Reactions  . Adhesive [Tape]   . Banana     Nausea and diarrhea  . Eggs Or Egg-Derived Products     Nausea and diarrhea  . Latex     sensativity  . Penicillins Rash  . Pine Swelling and Rash  . Rose Swelling and Rash    ROS As per HPI  PE: Blood pressure (!) 155/78, pulse 72, temperature 97.6 F (36.4 C), temperature source Oral, resp. rate 16, height 5\' 1"  (1.549 m), weight 293 lb (132.9 kg), last menstrual period 08/10/2010, SpO2 97 %. Gen: Alert, well appearing.  Patient is oriented to person, place, time, and situation. AFFECT: melancholy, cries occasionally when talking about anniversary of her son's death 60 yrs ago. Foot exam -  no swelling, tenderness or skin or vascular lesions. Color and temperature is normal. Sensation is intact. Peripheral pulses are palpable. Toenails are thick.  Pes planus bilat, with dorsal prominence of mid foot R>L. Mild TTP over R mid-foot. LL's 2+ pitting edema bilat.  No erythema, warmth, or tenderness.  Just a hint of pinkish discoloration of R anterior LL from mid tibia into top of ankle region.     LABS:  Lab Results  Component Value Date   TSH 1.49 02/13/2017   Lab Results  Component Value Date   WBC 7.5 08/18/2010   HGB 10.2 (L) 08/18/2010   HCT 30.9 (L) 08/18/2010   MCV 94.5 08/18/2010   PLT 230 08/18/2010  No results found for: IRON, TIBC,  FERRITIN  Lab Results  Component Value Date   CREATININE 0.88 06/28/2017   BUN 18 06/28/2017   NA 143 06/28/2017   K 4.3 06/28/2017   CL 105 06/28/2017   CO2 27 06/28/2017   Lab Results  Component Value Date   ALT 20 02/13/2017   AST 21 02/13/2017   ALKPHOS 55 02/13/2017   BILITOT 0.4 02/13/2017   Lab Results  Component Value Date   CHOL 144 02/13/2017   Lab Results  Component Value Date   HDL 31.80 (L) 02/13/2017   Lab Results  Component Value Date   LDLCALC 75 02/13/2017   Lab Results  Component Value Date   TRIG 184.0 (H) 02/13/2017   Lab Results  Component Value Date   CHOLHDL 5 02/13/2017   Lab Results  Component Value Date   HGBA1C 8.7 (H) 06/28/2017   IMPRESSION AND PLAN:  1) DM 2: not well controlled.   Hba1c today. Feet exam today: see exam above.  2) HTN: The current medical regimen is effective;  continue present plan and medications. Lytes/cr today.  3)HLD: tolerating statin.  Will be due for repeat FLP in 5-6 mo.  4)Foot deformity and mid foot pain: suspect osteoarthritis of ankle/mid foot. With her DM 2 and mild deformity on exam of feet, I'll check a foot x-ray to see if any early sign of charcot foot.  5) Chronic bilat LBP with mild L sided radicular pain: continue mobic qd, see below about pain med. Referred pt to PT at [facility] med center rehab. X-ray L spine ordered.  6) Chronic pain syndrome: Diffuse osteoarthritic pain: knees, wrists, ankles, hips, feet, low back. Continue mobic. She is miserable: start vicodin  5/325, 1-2 bid prn, #60.  Therapeutic expectations and side effect profile of medication discussed today.  Patient's questions answered. Hopefully this will be helpful enough to improve functioning and quality of life. If pt does improve and she is going to stay on opioid through me then I'll get drug contract in chart.  7) Grief rxn: she denies depression.  8) RLL cellulitis and stasis dermatitis: resolved.  She'll have    Spent 40 min with pt today, with >50% of this time spent in counseling and care coordination regarding the above problems.  An After Visit Summary was printed and given to the patient.  FOLLOW UP: Return in about 1 month (around 11/01/2017) for f/u chronic pain/arthritis.  Signed:  Crissie Sickles, MD           10/04/2017

## 2017-10-06 ENCOUNTER — Encounter: Payer: Self-pay | Admitting: Family Medicine

## 2017-10-09 ENCOUNTER — Other Ambulatory Visit: Payer: Self-pay

## 2017-10-09 ENCOUNTER — Ambulatory Visit: Payer: Medicare Other | Attending: Family Medicine | Admitting: Physical Therapy

## 2017-10-09 VITALS — BP 136/60 | HR 78

## 2017-10-09 DIAGNOSIS — R262 Difficulty in walking, not elsewhere classified: Secondary | ICD-10-CM | POA: Insufficient documentation

## 2017-10-09 DIAGNOSIS — M5442 Lumbago with sciatica, left side: Secondary | ICD-10-CM | POA: Diagnosis not present

## 2017-10-09 DIAGNOSIS — M6281 Muscle weakness (generalized): Secondary | ICD-10-CM | POA: Diagnosis not present

## 2017-10-09 DIAGNOSIS — G8929 Other chronic pain: Secondary | ICD-10-CM | POA: Insufficient documentation

## 2017-10-09 NOTE — Therapy (Addendum)
[facility] [address]  [place], [place], [address] Phone: [phone]   Fax:  [phone]  Physical Therapy Evaluation  Patient Details  Name: [name] MRN: [id number] Date of Birth: 26-Jul-1950 Referring Provider: Ricardo Jericho, MD   Encounter Date: 10/09/2017  PT End of Session - 10/09/17 1632    Visit Number  1    Number of Visits  17    Date for PT Re-Evaluation  12/04/17    Authorization Type  Medicare    PT Start Time  1536    PT Stop Time  1618    PT Time Calculation (min)  42 min    Activity Tolerance  Patient limited by pain;Patient limited by fatigue    Behavior During Therapy  [facility] for tasks assessed/performed       Past Medical History:  Diagnosis Date  . Allergic rhinitis    Allergy testing: results pending as of 05/28/16 (Dr. Harold Hedge)  . Anemia   . Arthritis   . Cellulitis 5/12   Left (Since replacemnt of Left knee  . Chronic renal insufficiency, stage II (mild) 2015  . Complex endometrial hyperplasia with atypia 12/12   Most recent endometrial bx was 10/2012-NEG  . Cough variant asthma   . DDD (degenerative disc disease), lumbar   . Diabetes mellitus ([place])   . Encounter for insertion of mirena IUD 5/12  . Fracture, foot 8/14   Hx of left foot stress fracture  . GERD (gastroesophageal reflux disease)   . Hemorrhoids   . Herpes zoster 10/2015   L side belt-line  . History of adenomatous polyp of colon 02/25/13   5 mm cecal polyp removed by Dr. Trevor Mace 5 yrs  . History of blood transfusion   . Hyperlipidemia    Not on statin b/c lipids "stayed down when sugars came down" per pt.  She says Dr. Chalmers Cater knows she is not on statin anymore.  . Hypertension   . Hypothyroidism   . Nephrolithiasis 4/07  . Obesity   . OSA on CPAP   . Osteoarthritis of both knees    Bilat TKA  . Osteoarthritis of right ankle and foot   . Osteoarthritis of right wrist    + ? scapholunate ligament  disruption (x-ray 06/2017)--ortho referral.  . Sciatica of left side   . Severe persistent asthma    cough-variant--saw Allergist 05/25/16 and was switched from max dose advair to symbicort.  . Systolic murmur    ECHO fine 10/2016-->murmur likely flow murmur assoc with HTN.  Marland Kitchen UTI (lower urinary tract infection)   . Zoon's vulvitis    Bx-proven (GYN) -lichen sclerosis.  Clobetasol 0.05% ointment per GYN    Past Surgical History:  Procedure Laterality Date  . ANKLE FRACTURE SURGERY  1984   Pin & repair  . ARTHROSCOPIC REPAIR ACL  2000   Due to ACL tear  . ARTHROSCOPIC REPAIR ACL  5/08  . Blateral knee rerlacements x4 2 times each knee    . BREAST BIOPSY Right 2014   Benign  . CARDIAC CATHETERIZATION  5/07   clear vessel mild mitral   . CARPAL TUNNEL RELEASE Right [id number]   1989 Left  . CHOLECYSTECTOMY  1988  . COLONOSCOPY N/A 02/25/2013   Tubular adenoma x 1: Recall 5 yrs. Procedure: COLONOSCOPY;  Surgeon: Juanita Craver, MD;  Location: [facility];  Service: Endoscopy;  Laterality: N/A;  . COMBINED HYSTEROSCOPY DIAGNOSTIC / D&C  2/12  Bx neg  . DEXA  08/2007   Bone density normal.  . DILATION AND CURETTAGE OF UTERUS  12/11  . REPLACEMENT TOTAL KNEE Left 5/12   X 2 on each  . TRANSTHORACIC ECHOCARDIOGRAM  10/11/2016    EF 55-60%, grd I DD.  . TUBAL LIGATION  1985   C-Section    Vitals:   10/09/17 1657  BP: 136/60  Pulse: 78  SpO2: 94%     Subjective Assessment - 10/09/17 1629    Subjective  Pt recently visited MD for back pain that she has been experiencing for the past few years. Has recently gotten much worse and pt states she is unable to stand for more than a few minutes at a time. She mentions that she has a lot of difficulty walking to her mailbox and walking into stores to the point where she can reach the scooters at the front of the store. Her pain can be relieved by medication, heat, and rest. Lives at home with two dogs, her husband and her daughter.     Limitations  Standing;Walking    How long can you stand comfortably?  < 2 min    How long can you walk comfortably?  < 2 min    Patient Stated Goals  to decrease pain    Currently in Pain?  Yes    Pain Score  -- 9/10 with standing    Pain Location  Back    Pain Orientation  Left    Pain Onset  More than a month ago    Pain Frequency  Intermittent    Aggravating Factors   Standing, walking    Pain Relieving Factors  Sitting, Meloxicam, Hydrocodone    Effect of Pain on Daily Activities  Unable to walk around grocery stores, walking to the mailbox is painful         [name] PT Assessment - 10/09/17 0001      Assessment   Medical Diagnosis  LBP with L sided sciatica    Referring Provider  Ricardo Jericho, MD    Next MD Visit  11/03/2017    Prior Therapy  Yes      Balance Screen   Has the patient fallen in the past 6 months  Yes    How many times?  1    Has the patient had a decrease in activity level because of a fear of falling?   No    Is the patient reluctant to leave their home because of a fear of falling?   No      Home Environment   Living Environment  Private residence    Living Arrangements  Spouse/significant other;Children    Type of [place]  Two level    Alternate Level Stairs-Number of Steps  [place] - single point      Prior Function   Level of Independence  Independent with basic ADLs    Vocation  Retired;On disability    Vocation Requirements  Retired Marine scientist    Leisure  Take care of dogs, fill bird feeder      Observation/Other Assessments   Focus on Therapeutic Outcomes (FOTO)   Low back: Intake status 34%; predicted status 48%      AROM   Lumbar Flexion  WFL    Lumbar Extension  WFL    Lumbar - Right Side Bend  [facility]    Lumbar - Left Side Bend  WFL + for increased pain    Lumbar - Right Rotation  WFL    Lumbar - Left Rotation  WFL + for increased pain      Strength   Right/Left Hip  Right;Left    Right Hip  Flexion  4-/5    Right Hip Extension  4-/5    Right Hip External Rotation   4/5    Right Hip Internal Rotation  4-/5    Right Hip ABduction  3/5    Right Hip ADduction  3+/5    Left Hip Flexion  4-/5    Left Hip Extension  3-/5    Left Hip External Rotation  3+/5    Left Hip Internal Rotation  3-/5    Left Hip ABduction  3/5    Left Hip ADduction  3+/5    Right/Left Knee  Right;Left    Right Knee Flexion  4/5    Right Knee Extension  4+/5    Left Knee Flexion  4/5    Left Knee Extension  4+/5      Ambulation/Gait   Ambulation/Gait  Yes    Ambulation/Gait Assistance  6: Modified independent (Device/Increase time)    Ambulation Distance (Feet)  60 Feet    Assistive device  Straight cane    Gait Pattern  Decreased arm swing - right;Decreased step length - right;Decreased arm swing - left;Decreased step length - left;Decreased stride length;Decreased hip/knee flexion - right;Decreased hip/knee flexion - left;Lateral trunk lean to right;Lateral trunk lean to left    Ambulation Surface  Level;Indoor                Objective measurements completed on examination: See above findings.              PT Education - 10/09/17 1632    Education Details  Pt educated on benefits of physical therapy and plan of care, and informed that HEP will be given next visit.     Person(s) Educated  Patient    Methods  Explanation    Comprehension  Verbalized understanding       PT Short Term Goals - 10/09/17 1650      PT SHORT TERM GOAL #1   Title  Pt will be independent with HEP     Status  New    [place] Date  11/06/17      PT SHORT TERM GOAL #2   Title  Pt will report pain that is no greater than 7/10 at worst    Status  New    [place] Date  11/06/17      PT SHORT TERM GOAL #3   Title  Pt will report ability to walk to the mailbox and back to her house without increased pain    Status  New    [place] Date  11/06/17        PT Long Term Goals - 10/09/17 1651      PT  LONG TERM GOAL #1   Title  Pt will be able to walk 300 ft without increased pain to improve ability to walk around a store    Status  New    [place] Date  12/04/17      PT LONG TERM GOAL #2   Title  Pt will have global LE strength of 4/5 to improve tolerance to activities    Status  New    Target Date  12/04/17      PT LONG TERM GOAL #3   Title  Pt will be able to stand for >/= 15 min without increased pain to improve tolerance to activities of daily living such as cooking    Status  New    Target Date  12/04/17      PT LONG TERM GOAL #4   Title  Pt will report pain at worst no greater than 5/10    Status  New    Target Date  12/04/17             Plan - 10/09/17 1638    Clinical Impression Statement  Pt is a 67 y/o F who presents to OP with c/c of LBP that is preventing her from doing ADLs without pain including walking to the mailbox. Examination revealed decreased lower extremity strength and increased pain with movement. Pt's prognosis is negatively impacted by her complex medical history, morbid obesity, the chronicity of her condition. Pt will benefit from physical therapy to improve LE strength, decrease pain, improve her ability to ambulate without increased pain, and improve her quality of life.    Clinical Presentation  Evolving    Clinical Presentation due to:  Complex medical history, obesity, and symptoms that are severe and worsening    Clinical Decision Making  Moderate    Rehab Potential  Fair    Clinical Impairments Affecting Rehab Potential  Chronicity of condition, complex medical history    PT Frequency  2x / week    PT Duration  8 weeks    PT Treatment/Interventions  ADLs/Self Care Home Management;Cryotherapy;Electrical Stimulation;Iontophoresis 4mg /ml Dexamethasone;Moist Heat;Ultrasound;Gait training;Stair training;Functional mobility training;Therapeutic activities;Therapeutic exercise;DME Instruction;Patient/family education;Manual techniques;Taping;Dry  needling    PT Next Visit Plan  Provide pt with HEP exercises    Consulted and Agree with Plan of Care  Patient       Patient will benefit from skilled therapeutic intervention in order to improve the following deficits and impairments:  Abnormal gait, Decreased endurance, Decreased activity tolerance, Decreased strength, Pain, Difficulty walking, Decreased mobility, Increased muscle spasms, Improper body mechanics  Visit Diagnosis: Chronic left-sided low back pain with left-sided sciatica  Muscle weakness (generalized)  Difficulty in walking, not elsewhere classified     Problem List Patient Active Problem List   Diagnosis Date Noted  . Scapholunate ligament injury, no instability 06/29/2017  . HTN (hypertension) 09/27/2016  . Dyspnea 09/27/2016  . Shingles outbreak 10/20/2015  . 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  . Hypothyroidism 07/10/2015  . OSA (obstructive sleep apnea) 07/10/2015  . Endometrial hyperplasia 07/02/2015  . Bronchitis 06/01/2015  . Cough variant asthma 11/02/2014  . Diabetes mellitus ([place])   . Obesity     Shirline Frees, SPT 10/09/2017, 6:55 PM  [facility] [address]  [place], [place], [address] Phone: [phone]   Fax:  [phone]  Name: ARELIZ ROTHMAN MRN: [id number] Date of Birth: 1950-04-17

## 2017-10-23 ENCOUNTER — Ambulatory Visit: Payer: Medicare Other

## 2017-10-23 DIAGNOSIS — G8929 Other chronic pain: Secondary | ICD-10-CM | POA: Diagnosis not present

## 2017-10-23 DIAGNOSIS — M5442 Lumbago with sciatica, left side: Secondary | ICD-10-CM

## 2017-10-23 DIAGNOSIS — R262 Difficulty in walking, not elsewhere classified: Secondary | ICD-10-CM | POA: Diagnosis not present

## 2017-10-23 DIAGNOSIS — M6281 Muscle weakness (generalized): Secondary | ICD-10-CM

## 2017-10-23 NOTE — Therapy (Signed)
[facility] [address]  [place], [place], [address] Phone: [phone]   Fax:  [phone]  Physical Therapy Treatment  Patient Details  Name: [name] MRN: [id number] Date of Birth: 10/17/1950 Referring Provider: Ricardo Jericho, MD   Encounter Date: 10/23/2017  PT End of Session - 10/23/17 1537    Visit Number  2    Number of Visits  17    Date for PT Re-Evaluation  12/04/17    Authorization Type  Medicare    PT Start Time  4008    PT Stop Time  6761    PT Time Calculation (min)  44 min    Activity Tolerance  Patient limited by pain;Patient limited by fatigue    Behavior During Therapy  [name] for tasks assessed/performed       Past Medical History:  Diagnosis Date  . Allergic rhinitis    Allergy testing: results pending as of 05/28/16 (Dr. Harold Hedge)  . Anemia   . Arthritis   . Cellulitis 5/12   Left (Since replacemnt of Left knee  . Chronic renal insufficiency, stage II (mild) 2015  . Complex endometrial hyperplasia with atypia 12/12   Most recent endometrial bx was 10/2012-NEG  . Cough variant asthma   . DDD (degenerative disc disease), lumbar   . Diabetes mellitus ([place])   . Encounter for insertion of mirena IUD 5/12  . Fracture, foot 8/14   Hx of left foot stress fracture  . GERD (gastroesophageal reflux disease)   . Hemorrhoids   . Herpes zoster 10/2015   L side belt-line  . History of adenomatous polyp of colon 02/25/13   5 mm cecal polyp removed by Dr. Trevor Mace 5 yrs  . History of blood transfusion   . Hyperlipidemia    Not on statin b/c lipids "stayed down when sugars came down" per pt.  She says Dr. Chalmers Cater knows she is not on statin anymore.  . Hypertension   . Hypothyroidism   . Nephrolithiasis 4/07  . Obesity   . OSA on CPAP   . Osteoarthritis of both knees    Bilat TKA  . Osteoarthritis of right ankle and foot   . Osteoarthritis of right wrist    + ? scapholunate ligament  disruption (x-ray 06/2017)--ortho referral.  . Sciatica of left side   . Severe persistent asthma    cough-variant--saw Allergist 05/25/16 and was switched from max dose advair to symbicort.  . Systolic murmur    ECHO fine 10/2016-->murmur likely flow murmur assoc with HTN.  Marland Kitchen UTI (lower urinary tract infection)   . Zoon's vulvitis    Bx-proven (GYN) -lichen sclerosis.  Clobetasol 0.05% ointment per GYN    Past Surgical History:  Procedure Laterality Date  . ANKLE FRACTURE SURGERY  1984   Pin & repair  . ARTHROSCOPIC REPAIR ACL  2000   Due to ACL tear  . ARTHROSCOPIC REPAIR ACL  5/08  . Blateral knee rerlacements x4 2 times each knee    . BREAST BIOPSY Right 2014   Benign  . CARDIAC CATHETERIZATION  5/07   clear vessel mild mitral   . CARPAL TUNNEL RELEASE Right [id number]   1989 Left  . CHOLECYSTECTOMY  1988  . COLONOSCOPY N/A 02/25/2013   Tubular adenoma x 1: Recall 5 yrs. Procedure: COLONOSCOPY;  Surgeon: Juanita Craver, MD;  Location: [facility];  Service: Endoscopy;  Laterality: N/A;  . COMBINED HYSTEROSCOPY DIAGNOSTIC / D&C  2/12  Bx neg  . DEXA  08/2007   Bone density normal.  . DILATION AND CURETTAGE OF UTERUS  12/11  . REPLACEMENT TOTAL KNEE Left 5/12   X 2 on each  . TRANSTHORACIC ECHOCARDIOGRAM  10/11/2016    EF 55-60%, grd I DD.  . TUBAL LIGATION  1985   C-Section    There were no vitals filed for this visit.  Subjective Assessment - 10/23/17 1532    Subjective  Pt. noting greater pain increase at lower back with walking still.      How long can you stand comfortably?  2 min    How long can you walk comfortably?  200 ft     Patient Stated Goals  to decrease pain    Currently in Pain?  Yes    Pain Score  4  LBP rises to 10/10 with walking     Pain Location  Back    Pain Orientation  Left    Pain Descriptors / Indicators  -- Unable to describe     Pain Type  Chronic pain                       [facility] Adult PT Treatment/Exercise - 10/23/17  1555      Lumbar Exercises: Supine   Ab Set  10 reps;5 seconds    Bridge  -- Terminated after 2 reps due to B HS cramp       Knee/Hip Exercises: Stretches   Passive Hamstring Stretch  Right;30 seconds;1 rep;Left due to R and L hamstring cramp with bridge     Passive Hamstring Stretch Limitations  with therapist       Knee/Hip Exercises: Aerobic   Nustep  Lvl 2, 7 min       Knee/Hip Exercises: Seated   Long Arc Quad  Right;Left;10 reps;Strengthening    Hamstring Curl  Right;Left;Strengthening;10 reps    Hamstring Limitations  red TB      Knee/Hip Exercises: Supine   Other Supine Knee/Hip Exercises  Hooklying adduction ball squeeze 5" x 10 reps     Other Supine Knee/Hip Exercises  Hooklying alternating clam shell with red TB at knees x 10 reps each way       Knee/Hip Exercises: Sidelying   Clams  B clams shell without resistance x 10 reps              PT Education - 10/23/17 1813    Education Details  HEP update    Person(s) Educated  Patient    Methods  Explanation;Demonstration;Verbal cues;Handout    Comprehension  Verbalized understanding;Returned demonstration;Verbal cues required;Need further instruction       PT Short Term Goals - 10/23/17 1537      PT SHORT TERM GOAL #1   Title  Pt will be independent with HEP     Status  On-going      PT SHORT TERM GOAL #2   Title  Pt will report pain that is no greater than 7/10 at worst    Status  On-going      PT SHORT TERM GOAL #3   Title  Pt will report ability to walk to the mailbox and back to her house without increased pain    Status  On-going        PT Long Term Goals - 10/23/17 1537      PT LONG TERM GOAL #1   Title  Pt will be able to walk 300 ft without increased  pain to improve ability to walk around a store    Status  On-going      PT LONG TERM GOAL #2   Title  Pt will have global LE strength of 4/5 to improve tolerance to activities    Status  On-going      PT LONG TERM GOAL #3   Title  Pt  will be able to stand for >/= 15 min without increased pain to improve tolerance to activities of daily living such as cooking    Status  On-going      PT LONG TERM GOAL #4   Title  Pt will report pain at worst no greater than 5/10    Status  On-going            Plan - 10/23/17 1538    Clinical Impression Statement  [name] seen today for first visit in ~ two weeks noting severe back pain to start session.  LBP still worst while walking.  Malina tolerated all supine and seated lower LE/proximal hip strengthening activities well today.  Did require cueing for proper abdominal bracing technique to avoid holding breath today.  Ended session with HEP handout issued to pt. including activities pt. tolerated well in session.  Will monitor tolerance to HEP at upcoming visit.    Clinical Impairments Affecting Rehab Potential  Chronicity of condition, complex medical history    PT Treatment/Interventions  ADLs/Self Care Home Management;Cryotherapy;Electrical Stimulation;Iontophoresis 4mg /ml Dexamethasone;Moist Heat;Ultrasound;Gait training;Stair training;Functional mobility training;Therapeutic activities;Therapeutic exercise;DME Instruction;Patient/family education;Manual techniques;Taping;Dry needling    PT Next Visit Plan  Monitor tolerance for HEP exercises    Consulted and Agree with Plan of Care  Patient       Patient will benefit from skilled therapeutic intervention in order to improve the following deficits and impairments:  Abnormal gait, Decreased endurance, Decreased activity tolerance, Decreased strength, Pain, Difficulty walking, Decreased mobility, Increased muscle spasms, Improper body mechanics  Visit Diagnosis: Muscle weakness (generalized)  Difficulty in walking, not elsewhere classified  Chronic left-sided low back pain with left-sided sciatica     Problem List Patient Active Problem List   Diagnosis Date Noted  . Scapholunate ligament injury, no instability 06/29/2017   . HTN (hypertension) 09/27/2016  . Dyspnea 09/27/2016  . Shingles outbreak 10/20/2015  . 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  . Hypothyroidism 07/10/2015  . OSA (obstructive sleep apnea) 07/10/2015  . Endometrial hyperplasia 07/02/2015  . Bronchitis 06/01/2015  . Cough variant asthma 11/02/2014  . Diabetes mellitus ([place])   . Obesity    Bess Harvest, [place] 10/23/17 6:25 PM   [facility] [address]  [place], [place], [address] Phone: [phone]   Fax:  [phone]  Name: LYNDSEY DEMOS MRN: [id number] Date of Birth: 08-03-1950

## 2017-10-24 ENCOUNTER — Ambulatory Visit (INDEPENDENT_AMBULATORY_CARE_PROVIDER_SITE_OTHER): Payer: Medicare Other | Admitting: Podiatry

## 2017-10-24 DIAGNOSIS — IMO0002 Reserved for concepts with insufficient information to code with codable children: Secondary | ICD-10-CM

## 2017-10-24 DIAGNOSIS — M2042 Other hammer toe(s) (acquired), left foot: Secondary | ICD-10-CM

## 2017-10-24 DIAGNOSIS — E08618 Diabetes mellitus due to underlying condition with other diabetic arthropathy: Secondary | ICD-10-CM | POA: Diagnosis not present

## 2017-10-24 DIAGNOSIS — M21961 Unspecified acquired deformity of right lower leg: Secondary | ICD-10-CM

## 2017-10-24 DIAGNOSIS — E0865 Diabetes mellitus due to underlying condition with hyperglycemia: Secondary | ICD-10-CM

## 2017-10-24 DIAGNOSIS — B351 Tinea unguium: Secondary | ICD-10-CM

## 2017-10-24 NOTE — Progress Notes (Signed)
SUBJECTIVE: 67 y.o. year old female presents stating that she has DJD and in need of a pair of diabetic shoes. Been diabetic x 20 years. Ambulates with aid of a cane for hip pain. Last A1c was 9 about 2 weeks ago. Also been treated with Prednisone 2 weeks prior to that. Patient is a retired Therapist, sports.  Diagnosed with Hypothyroid, DJD, Hs of ankle bone surgery following fracture trauma in 1984. Review of Systems  Constitutional: Negative.   HENT: Negative.   Respiratory:       Asthma and sleep apnea.  Cardiovascular: Negative.   Gastrointestinal: Negative.   Genitourinary: Negative.   Musculoskeletal: Positive for joint pain.       DJD affecting bad on right hand, back L5 being worse, left Sciatica. History of 4 knee replacement.   Skin: Negative.     OBJECTIVE: DERMATOLOGIC EXAMINATION: Dystrophic nails x 10.  Digital corns distal end 2-4 left, pinch callus left great toe.  VASCULAR EXAMINATION OF LOWER LIMBS: All pedal pulses are palpable with normal pulsation.  Capillary Filling times within 3 seconds in all digits.  No edema or erythema noted. Temperature gradient from tibial crest to dorsum of foot is within normal bilateral.  NEUROLOGIC EXAMINATION OF THE LOWER LIMBS: Achilles DTR is present and within normal. Monofilament (Semmes-Weinstein 10-gm) sensory testing positive 6 out of 6, bilateral. Vibratory sensations(128Hz  turning fork) intact at medial and lateral forefoot bilateral.  Sharp and Dull discriminatory sensations at the plantar ball of hallux is intact bilateral.   MUSCULOSKELETAL EXAMINATION: Positive for contracted lesser digits with digital corns 2-4 left foot.  Her previous x-ray done on right (10/07/17) with her other physician show short first ray with adducted lesser metatarsal and inferior calcaneal spur. Normal joint spaces and osseous structures seen without any acute changes.  RADIOGRAPHIC STUDIES of left foot:  AP View:  Metatarsus adductus, enlarged  medial aspect Navicular bone, Fibular sesamoid position is at 4. Lateral view:  High arch cavus foot with first ray elevatus.  ASSESSMENT: Onychomycosis. Hammer toes 2-4 left. Metatarsus adductus.  Uncontrolled diabetic.  PLAN: Reviewed findings and available treatment options. All nails debrided. Both feet measured for diabetic shoes.

## 2017-10-24 NOTE — Patient Instructions (Signed)
Seen for diabetic foot care. All nails and calluses debrided. Both feet measured for diabetic shoes. Return for Routine foot care in 3 months.

## 2017-10-25 ENCOUNTER — Ambulatory Visit: Payer: Medicare Other | Admitting: Physical Therapy

## 2017-10-25 ENCOUNTER — Encounter: Payer: Self-pay | Admitting: Podiatry

## 2017-10-25 ENCOUNTER — Encounter: Payer: Self-pay | Admitting: Physical Therapy

## 2017-10-25 VITALS — BP 148/92 | HR 95

## 2017-10-25 DIAGNOSIS — R262 Difficulty in walking, not elsewhere classified: Secondary | ICD-10-CM

## 2017-10-25 DIAGNOSIS — G8929 Other chronic pain: Secondary | ICD-10-CM | POA: Diagnosis not present

## 2017-10-25 DIAGNOSIS — M5442 Lumbago with sciatica, left side: Secondary | ICD-10-CM | POA: Diagnosis not present

## 2017-10-25 DIAGNOSIS — M6281 Muscle weakness (generalized): Secondary | ICD-10-CM

## 2017-10-25 NOTE — Therapy (Signed)
[facility] [address]  [place], [place], [address] Phone: [phone]   Fax:  [phone]  Physical Therapy Treatment  Patient Details  Name: [name] MRN: [id number] Date of Birth: [birth year] Referring Provider: Ricardo Jericho, MD   Encounter Date: 10/25/2017  PT End of Session - 10/25/17 1629    Visit Number  3    Number of Visits  17    Date for PT Re-Evaluation  12/04/17    Authorization Type  Medicare    PT Start Time  1532    PT Stop Time  8250    PT Time Calculation (min)  43 min    Activity Tolerance  Patient limited by pain;Patient tolerated treatment well    Behavior During Therapy  [facility] for tasks assessed/performed       Past Medical History:  Diagnosis Date  . Allergic rhinitis    Allergy testing: results pending as of 05/28/16 (Dr. Harold Hedge)  . Anemia   . Arthritis   . Cellulitis 5/12   Left (Since replacemnt of Left knee  . Chronic renal insufficiency, stage II (mild) 2015  . Complex endometrial hyperplasia with atypia 12/12   Most recent endometrial bx was 10/2012-NEG  . Cough variant asthma   . DDD (degenerative disc disease), lumbar   . Diabetes mellitus ([place])   . Encounter for insertion of mirena IUD 5/12  . Fracture, foot 8/14   Hx of left foot stress fracture  . GERD (gastroesophageal reflux disease)   . Hemorrhoids   . Herpes zoster 10/2015   L side belt-line  . History of adenomatous polyp of colon 02/25/13   5 mm cecal polyp removed by Dr. Trevor Mace 5 yrs  . History of blood transfusion   . Hyperlipidemia    Not on statin b/c lipids "stayed down when sugars came down" per pt.  She says Dr. Chalmers Cater knows she is not on statin anymore.  . Hypertension   . Hypothyroidism   . Nephrolithiasis 4/07  . Obesity   . OSA on CPAP   . Osteoarthritis of both knees    Bilat TKA  . Osteoarthritis of right ankle and foot   . Osteoarthritis of right wrist    + ? scapholunate  ligament disruption (x-ray 06/2017)--ortho referral.  . Sciatica of left side   . Severe persistent asthma    cough-variant--saw Allergist 05/25/16 and was switched from max dose advair to symbicort.  . Systolic murmur    ECHO fine 10/2016-->murmur likely flow murmur assoc with HTN.  Marland Kitchen UTI (lower urinary tract infection)   . Zoon's vulvitis    Bx-proven (GYN) -lichen sclerosis.  Clobetasol 0.05% ointment per GYN    Past Surgical History:  Procedure Laterality Date  . ANKLE FRACTURE SURGERY  1984   Pin & repair  . ARTHROSCOPIC REPAIR ACL  2000   Due to ACL tear  . ARTHROSCOPIC REPAIR ACL  5/08  . Blateral knee rerlacements x4 2 times each knee    . BREAST BIOPSY Right 2014   Benign  . CARDIAC CATHETERIZATION  5/07   clear vessel mild mitral   . CARPAL TUNNEL RELEASE Right [id number]   1989 Left  . CHOLECYSTECTOMY  1988  . COLONOSCOPY N/A 02/25/2013   Tubular adenoma x 1: Recall 5 yrs. Procedure: COLONOSCOPY;  Surgeon: Juanita Craver, MD;  Location: [facility];  Service: Endoscopy;  Laterality: N/A;  . COMBINED HYSTEROSCOPY DIAGNOSTIC / D&C  2/12  Bx neg  . DEXA  08/2007   Bone density normal.  . DILATION AND CURETTAGE OF UTERUS  12/11  . REPLACEMENT TOTAL KNEE Left 5/12   X 2 on each  . TRANSTHORACIC ECHOCARDIOGRAM  10/11/2016    EF 55-60%, grd I DD.  . TUBAL LIGATION  1985   C-Section    Vitals:   10/25/17 1535  BP: (!) 148/92  Pulse: 95  SpO2: 95%    Subjective Assessment - 10/25/17 1535    Subjective  Patient reports taking BG before lunch and it was 140. Reports she is fighting off an asthma attack from changing temperatures. Reports she is flared up today, not sure what could have caused it. Reports compliance with HEP but "by the end they are bothering me."    Patient Stated Goals  to decrease pain    Currently in Pain?  Yes    Pain Score  8     Pain Location  Back    Pain Orientation  Left;Posterior    Pain Type  Chronic pain                        [facility] Adult PT Treatment/Exercise - 10/25/17 0001      Lumbar Exercises: Supine   Clam  10 reps red TB around knees; 2x10    Other Supine Lumbar Exercises  supine B LE marching 10x each side      Knee/Hip Exercises: Stretches   Passive Hamstring Stretch  Right;1 rep;Left;20 seconds    Passive Hamstring Stretch Limitations  with therapist assistance      Knee/Hip Exercises: Standing   Other Standing Knee Exercises  sidestepping without TB; 2 sets of 2x length of counter top limited by pain and required sitting rest break d/t pain      Knee/Hip Exercises: Seated   Long Arc Quad  Right;Left;10 reps;Strengthening;Weights    Long Arc Quad Weight  2 lbs.    Hamstring Curl  Right;Left;Strengthening;10 reps    Hamstring Limitations  red TB    Sit to Sand  2 sets;5 reps;without UE support vcs for slow eccentric lower      Knee/Hip Exercises: Supine   Other Supine Knee/Hip Exercises  Hooklying adduction ball squeeze 5" x 10 reps       Knee/Hip Exercises: Sidelying   Clams  B clams shell without resistance x 15 reps                PT Short Term Goals - 10/23/17 1537      PT SHORT TERM GOAL #1   Title  Pt will be independent with HEP     Status  On-going      PT SHORT TERM GOAL #2   Title  Pt will report pain that is no greater than 7/10 at worst    Status  On-going      PT SHORT TERM GOAL #3   Title  Pt will report ability to walk to the mailbox and back to her house without increased pain    Status  On-going        PT Long Term Goals - 10/23/17 1537      PT LONG TERM GOAL #1   Title  Pt will be able to walk 300 ft without increased pain to improve ability to walk around a store    Status  On-going      PT LONG TERM GOAL #2   Title  Pt will  have global LE strength of 4/5 to improve tolerance to activities    Status  On-going      PT LONG TERM GOAL #3   Title  Pt will be able to stand for >/= 15 min without increased pain to  improve tolerance to activities of daily living such as cooking    Status  On-going      PT LONG TERM GOAL #4   Title  Pt will report pain at worst no greater than 5/10    Status  On-going            Plan - 10/25/17 1630    Clinical Impression Statement  Patient arrived to session with hand brace on R hand and with report that she has been having an exacerbation of 8/10 L LBP and L hip pain, not sure of what could have caused this. Also reports walking into clinic and the change in temperature caused her to have an asthma attack. Took vitals- patient with slightly increased BP. Tolerated gentle supine and seated hip strengthening exercises propped on wedge to allow for ease of breathing with no c/o pain. Patient requiring to use her inhaler during session d/t feeling like she is wheezing- no other complaints of SOB or wheezing after taking inhaler dose. Patient performed sidestepping at counter top with limited tolerance d/t pain; required sitting rest break d/t pain. Allowed patient sitting rest until ready to leave. Report of 8/10 pain in LB and L hip- unchanged from beginning of session.     PT Treatment/Interventions  ADLs/Self Care Home Management;Cryotherapy;Electrical Stimulation;Iontophoresis 4mg /ml Dexamethasone;Moist Heat;Ultrasound;Gait training;Stair training;Functional mobility training;Therapeutic activities;Therapeutic exercise;DME Instruction;Patient/family education;Manual techniques;Taping;Dry needling    Consulted and Agree with Plan of Care  Patient       Patient will benefit from skilled therapeutic intervention in order to improve the following deficits and impairments:  Abnormal gait, Decreased endurance, Decreased activity tolerance, Decreased strength, Pain, Difficulty walking, Decreased mobility, Increased muscle spasms, Improper body mechanics  Visit Diagnosis: Muscle weakness (generalized)  Difficulty in walking, not elsewhere classified  Chronic left-sided low  back pain with left-sided sciatica     Problem List Patient Active Problem List   Diagnosis Date Noted  . Scapholunate ligament injury, no instability 06/29/2017  . HTN (hypertension) 09/27/2016  . Dyspnea 09/27/2016  . Shingles outbreak 10/20/2015  . 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  . Hypothyroidism 07/10/2015  . OSA (obstructive sleep apnea) 07/10/2015  . Endometrial hyperplasia 07/02/2015  . Bronchitis 06/01/2015  . Cough variant asthma 11/02/2014  . Diabetes mellitus ([place])   . Obesity     Janene Harvey, PT, DPT 10/25/17 4:45 PM   [facility] [address]  [place], [place], [address] Phone: [phone]   Fax:  [phone]  Name: JOSELYNE SPAKE MRN: [id number] Date of Birth: [birth year]

## 2017-10-27 ENCOUNTER — Other Ambulatory Visit: Payer: Self-pay | Admitting: Family Medicine

## 2017-10-27 DIAGNOSIS — I10 Essential (primary) hypertension: Secondary | ICD-10-CM

## 2017-10-30 ENCOUNTER — Ambulatory Visit: Payer: Medicare Other

## 2017-10-30 DIAGNOSIS — M6281 Muscle weakness (generalized): Secondary | ICD-10-CM | POA: Diagnosis not present

## 2017-10-30 DIAGNOSIS — M5442 Lumbago with sciatica, left side: Secondary | ICD-10-CM | POA: Diagnosis not present

## 2017-10-30 DIAGNOSIS — R262 Difficulty in walking, not elsewhere classified: Secondary | ICD-10-CM | POA: Diagnosis not present

## 2017-10-30 DIAGNOSIS — G8929 Other chronic pain: Secondary | ICD-10-CM

## 2017-10-30 NOTE — Therapy (Signed)
[facility] [address]  [place], [place], [address] Phone: [phone]   Fax:  [phone]  Physical Therapy Treatment  Patient Details  Name: [name] MRN: [id number] Date of Birth: 10-24-1950 Referring Provider: Ricardo Jericho, MD   Encounter Date: 10/30/2017  PT End of Session - 10/30/17 1531    Visit Number  4    Number of Visits  17    Date for PT Re-Evaluation  12/04/17    Authorization Type  Medicare    PT Start Time  1532    PT Stop Time  1625    PT Time Calculation (min)  53 min    Activity Tolerance  Patient limited by pain;Patient tolerated treatment well    Behavior During Therapy  [facility] for tasks assessed/performed       Past Medical History:  Diagnosis Date  . Allergic rhinitis    Allergy testing: results pending as of 05/28/16 (Dr. Harold Hedge)  . Anemia   . Arthritis   . Cellulitis 5/12   Left (Since replacemnt of Left knee  . Chronic renal insufficiency, stage II (mild) 2015  . Complex endometrial hyperplasia with atypia 12/12   Most recent endometrial bx was 10/2012-NEG  . Cough variant asthma   . DDD (degenerative disc disease), lumbar   . Diabetes mellitus ([place])   . Encounter for insertion of mirena IUD 5/12  . Fracture, foot 8/14   Hx of left foot stress fracture  . GERD (gastroesophageal reflux disease)   . Hemorrhoids   . Herpes zoster 10/2015   L side belt-line  . History of adenomatous polyp of colon 02/25/13   5 mm cecal polyp removed by Dr. Trevor Mace 5 yrs  . History of blood transfusion   . Hyperlipidemia    Not on statin b/c lipids "stayed down when sugars came down" per pt.  She says Dr. Chalmers Cater knows she is not on statin anymore.  . Hypertension   . Hypothyroidism   . Nephrolithiasis 4/07  . Obesity   . OSA on CPAP   . Osteoarthritis of both knees    Bilat TKA  . Osteoarthritis of right ankle and foot   . Osteoarthritis of right wrist    + ? scapholunate  ligament disruption (x-ray 06/2017)--ortho referral.  . Sciatica of left side   . Severe persistent asthma    cough-variant--saw Allergist 05/25/16 and was switched from max dose advair to symbicort.  . Systolic murmur    ECHO fine 10/2016-->murmur likely flow murmur assoc with HTN.  Marland Kitchen UTI (lower urinary tract infection)   . Zoon's vulvitis    Bx-proven (GYN) -lichen sclerosis.  Clobetasol 0.05% ointment per GYN    Past Surgical History:  Procedure Laterality Date  . ANKLE FRACTURE SURGERY  1984   Pin & repair  . ARTHROSCOPIC REPAIR ACL  2000   Due to ACL tear  . ARTHROSCOPIC REPAIR ACL  5/08  . Blateral knee rerlacements x4 2 times each knee    . BREAST BIOPSY Right 2014   Benign  . CARDIAC CATHETERIZATION  5/07   clear vessel mild mitral   . CARPAL TUNNEL RELEASE Right [id number]   1989 Left  . CHOLECYSTECTOMY  1988  . COLONOSCOPY N/A 02/25/2013   Tubular adenoma x 1: Recall 5 yrs. Procedure: COLONOSCOPY;  Surgeon: Juanita Craver, MD;  Location: [facility];  Service: Endoscopy;  Laterality: N/A;  . COMBINED HYSTEROSCOPY DIAGNOSTIC / D&C  2/12  Bx neg  . DEXA  08/2007   Bone density normal.  . DILATION AND CURETTAGE OF UTERUS  12/11  . REPLACEMENT TOTAL KNEE Left 5/12   X 2 on each  . TRANSTHORACIC ECHOCARDIOGRAM  10/11/2016    EF 55-60%, grd I DD.  . TUBAL LIGATION  1985   C-Section    There were no vitals filed for this visit.  Subjective Assessment - 10/30/17 1535    Subjective  Pt. reporting no trouble with asthma attacks today.      Patient Stated Goals  to decrease pain    Currently in Pain?  Yes    Pain Score  8     Pain Location  Hip    Pain Orientation  Left;Posterior    Pain Descriptors / Indicators  -- Unable to describe    Pain Type  Chronic pain    Pain Radiating Towards  extending into back while standing     Pain Onset  More than a month ago    Pain Frequency  Intermittent    Aggravating Factors   standing, walking     Multiple Pain Sites  No                        [facility] Adult PT Treatment/Exercise - 10/30/17 1542      Lumbar Exercises: Supine   Bridge  10 reps;3 seconds    Straight Leg Raise  10 reps;3 seconds      Knee/Hip Exercises: Aerobic   Nustep  Lvl 4, 8 min       Knee/Hip Exercises: Standing   Hip Abduction  Right;Left;Stengthening;10 reps;Knee straight    Abduction Limitations  at chair     Hip Extension  Right;Left;Stengthening;10 reps;Knee straight    Extension Limitations  at chair       Knee/Hip Exercises: Seated   Clamshell with TheraBand  Red x 20 reps     Other Seated Knee/Hip Exercises  B fitter leg press (1 blue, 1 black band) x 10 reps each way     Marching  Right;Left;10 reps;Strengthening    Hamstring Curl  Right;Left;Strengthening;15 reps    Hamstring Limitations  red TB      Modalities   Modalities  Electrical Stimulation;Moist Heat      Moist Heat Therapy   Number Minutes Moist Heat  10 Minutes    Moist Heat Location  Lumbar Spine      Electrical Stimulation   Electrical Stimulation Location  lumbar spine     Electrical Stimulation Action  IFC    Electrical Stimulation Parameters  to tolerance, 10'    Electrical Stimulation Goals  Pain             PT Education - 10/30/17 1541    Education Details  HEP update    Person(s) Educated  Patient    Methods  Explanation;Demonstration;Verbal cues;Handout    Comprehension  Verbalized understanding;Returned demonstration;Verbal cues required;Need further instruction       PT Short Term Goals - 10/23/17 1537      PT SHORT TERM GOAL #1   Title  Pt will be independent with HEP     Status  On-going      PT SHORT TERM GOAL #2   Title  Pt will report pain that is no greater than 7/10 at worst    Status  On-going      PT SHORT TERM GOAL #3   Title  Pt will report ability  to walk to the mailbox and back to her house without increased pain    Status  On-going        PT Long Term Goals - 10/23/17 1537      PT LONG  TERM GOAL #1   Title  Pt will be able to walk 300 ft without increased pain to improve ability to walk around a store    Status  On-going      PT LONG TERM GOAL #2   Title  Pt will have global LE strength of 4/5 to improve tolerance to activities    Status  On-going      PT LONG TERM GOAL #3   Title  Pt will be able to stand for >/= 15 min without increased pain to improve tolerance to activities of daily living such as cooking    Status  On-going      PT LONG TERM GOAL #4   Title  Pt will report pain at worst no greater than 5/10    Status  On-going            Plan - 10/30/17 1537    Clinical Impression Statement  [name] reporting no significant change in LBP since starting therapy.  Does feel she is able to tolerate current HEP activities however does have increased thigh cramping with supine bridging.  HEP updated with seated hip strengthening activities as pt. tolerated these activities better in session as compared to supine.  [name] ended session with LBP thus trialed E-stim/moist heat to lumbar spine for hopeful decrease in pain.  Will monitor response in coming visits.      Clinical Impairments Affecting Rehab Potential  Chronicity of condition, complex medical history    PT Treatment/Interventions  ADLs/Self Care Home Management;Cryotherapy;Electrical Stimulation;Iontophoresis 4mg /ml Dexamethasone;Moist Heat;Ultrasound;Gait training;Stair training;Functional mobility training;Therapeutic activities;Therapeutic exercise;DME Instruction;Patient/family education;Manual techniques;Taping;Dry needling    Consulted and Agree with Plan of Care  Patient       Patient will benefit from skilled therapeutic intervention in order to improve the following deficits and impairments:  Abnormal gait, Decreased endurance, Decreased activity tolerance, Decreased strength, Pain, Difficulty walking, Decreased mobility, Increased muscle spasms, Improper body mechanics  Visit Diagnosis: Muscle  weakness (generalized)  Difficulty in walking, not elsewhere classified  Chronic left-sided low back pain with left-sided sciatica     Problem List Patient Active Problem List   Diagnosis Date Noted  . Scapholunate ligament injury, no instability 06/29/2017  . HTN (hypertension) 09/27/2016  . Dyspnea 09/27/2016  . Shingles outbreak 10/20/2015  . 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  . Hypothyroidism 07/10/2015  . OSA (obstructive sleep apnea) 07/10/2015  . Endometrial hyperplasia 07/02/2015  . Bronchitis 06/01/2015  . Cough variant asthma 11/02/2014  . Diabetes mellitus ([place])   . Obesity     Bess Harvest, [place] 10/30/17 6:11 PM   [facility] [address]  [place], [place], [address] Phone: [phone]   Fax:  [phone]  Name: [name] MRN: [id number] Date of Birth: 1950-06-15

## 2017-11-01 ENCOUNTER — Ambulatory Visit: Payer: Medicare Other

## 2017-11-01 DIAGNOSIS — M5442 Lumbago with sciatica, left side: Secondary | ICD-10-CM | POA: Diagnosis not present

## 2017-11-01 DIAGNOSIS — R262 Difficulty in walking, not elsewhere classified: Secondary | ICD-10-CM

## 2017-11-01 DIAGNOSIS — M6281 Muscle weakness (generalized): Secondary | ICD-10-CM | POA: Diagnosis not present

## 2017-11-01 DIAGNOSIS — G8929 Other chronic pain: Secondary | ICD-10-CM

## 2017-11-01 NOTE — Therapy (Signed)
[facility] [address]  [place], [place], [address] Phone: [phone]   Fax:  [phone]  Physical Therapy Treatment  Patient Details  Name: [name] MRN: [id number] Date of Birth: 16-Jul-1950 Referring Provider: Ricardo Jericho, MD   Encounter Date: 11/01/2017  PT End of Session - 11/01/17 1547    Visit Number  5    Number of Visits  17    Date for PT Re-Evaluation  12/04/17    Authorization Type  Medicare    PT Start Time  9622 pt. arrived late     PT Stop Time  2979    PT Time Calculation (min)  55 min    Activity Tolerance  Patient limited by pain;Patient tolerated treatment well    Behavior During Therapy  [facility] for tasks assessed/performed       Past Medical History:  Diagnosis Date  . Allergic rhinitis    Allergy testing: results pending as of 05/28/16 (Dr. Harold Hedge)  . Anemia   . Arthritis   . Cellulitis 5/12   Left (Since replacemnt of Left knee  . Chronic renal insufficiency, stage II (mild) 2015  . Complex endometrial hyperplasia with atypia 12/12   Most recent endometrial bx was 10/2012-NEG  . Cough variant asthma   . DDD (degenerative disc disease), lumbar   . Diabetes mellitus ([place])   . Encounter for insertion of mirena IUD 5/12  . Fracture, foot 8/14   Hx of left foot stress fracture  . GERD (gastroesophageal reflux disease)   . Hemorrhoids   . Herpes zoster 10/2015   L side belt-line  . History of adenomatous polyp of colon 02/25/13   5 mm cecal polyp removed by Dr. Trevor Mace 5 yrs  . History of blood transfusion   . Hyperlipidemia    Not on statin b/c lipids "stayed down when sugars came down" per pt.  She says Dr. Chalmers Cater knows she is not on statin anymore.  . Hypertension   . Hypothyroidism   . Nephrolithiasis 4/07  . Obesity   . OSA on CPAP   . Osteoarthritis of both knees    Bilat TKA  . Osteoarthritis of right ankle and foot   . Osteoarthritis of right wrist    +  ? scapholunate ligament disruption (x-ray 06/2017)--ortho referral.  . Sciatica of left side   . Severe persistent asthma    cough-variant--saw Allergist 05/25/16 and was switched from max dose advair to symbicort.  . Systolic murmur    ECHO fine 10/2016-->murmur likely flow murmur assoc with HTN.  Marland Kitchen UTI (lower urinary tract infection)   . Zoon's vulvitis    Bx-proven (GYN) -lichen sclerosis.  Clobetasol 0.05% ointment per GYN    Past Surgical History:  Procedure Laterality Date  . ANKLE FRACTURE SURGERY  1984   Pin & repair  . ARTHROSCOPIC REPAIR ACL  2000   Due to ACL tear  . ARTHROSCOPIC REPAIR ACL  5/08  . Blateral knee rerlacements x4 2 times each knee    . BREAST BIOPSY Right 2014   Benign  . CARDIAC CATHETERIZATION  5/07   clear vessel mild mitral   . CARPAL TUNNEL RELEASE Right [id number]   1989 Left  . CHOLECYSTECTOMY  1988  . COLONOSCOPY N/A 02/25/2013   Tubular adenoma x 1: Recall 5 yrs. Procedure: COLONOSCOPY;  Surgeon: Juanita Craver, MD;  Location: [facility];  Service: Endoscopy;  Laterality: N/A;  . COMBINED HYSTEROSCOPY DIAGNOSTIC / D&C  2/12   Bx neg  . DEXA  08/2007   Bone density normal.  . DILATION AND CURETTAGE OF UTERUS  12/11  . REPLACEMENT TOTAL KNEE Left 5/12   X 2 on each  . TRANSTHORACIC ECHOCARDIOGRAM  10/11/2016    EF 55-60%, grd I DD.  . TUBAL LIGATION  1985   C-Section    There were no vitals filed for this visit.  Subjective Assessment - 11/01/17 1547    Subjective  Pt. noting pain rises to 10/10 at worst still after prolonged walking.      Patient Stated Goals  to decrease pain    Currently in Pain?  Yes    Pain Score  8     Pain Location  Hip    Pain Orientation  Left;Posterior    Pain Type  Chronic pain    Pain Onset  More than a month ago    Multiple Pain Sites  No         [facility] PT Assessment - 11/01/17 1554      Strength   Right/Left Hip  Right;Left    Right Hip Flexion  4/5    Right Hip External Rotation   4-/5    Right  Hip Internal Rotation  4/5    Right Hip ABduction  4-/5    Right Hip ADduction  3+/5    Left Hip Flexion  4/5    Left Hip External Rotation  4-/5    Left Hip Internal Rotation  4-/5    Left Hip ABduction  4-/5    Left Hip ADduction  3+/5    Right/Left Knee  Right;Left    Right Knee Flexion  4-/5    Right Knee Extension  4+/5    Left Knee Flexion  4+/5    Left Knee Extension  5/5                   [facility] Adult PT Treatment/Exercise - 11/01/17 1554      Ambulation/Gait   Ambulation/Gait  Yes    Ambulation/Gait Assistance  6: Modified independent (Device/Increase time)    Ambulation Distance (Feet)  300 Feet    Assistive device  Straight cane    Gait Pattern  Decreased arm swing - right;Decreased step length - right;Decreased arm swing - left;Decreased step length - left;Decreased stride length;Decreased hip/knee flexion - right;Decreased hip/knee flexion - left;Lateral trunk lean to right;Lateral trunk lean to left    Ambulation Surface  Level;Indoor    Gait Comments  Pt. noting pain increasing from 7/10 initially > 9/10 by end of gait trial       Knee/Hip Exercises: Aerobic   Nustep  Lvl 4, 5 min       Moist Heat Therapy   Number Minutes Moist Heat  15 Minutes    Moist Heat Location  Lumbar Spine      Electrical Stimulation   Electrical Stimulation Location  lumbar spine     Electrical Stimulation Action  IFC    Electrical Stimulation Parameters  to tolerance, 15'    Electrical Stimulation Goals  Pain      Manual Therapy   Manual Therapy  Soft tissue mobilization;Passive ROM    Manual therapy comments  R sidelying     Soft tissue mobilization  STM to L glute with L LE elevated on bolster     Passive ROM  Manual L glute, KTOS stretch as able x 30 sec  PT Short Term Goals - 11/01/17 1601      PT SHORT TERM GOAL #1   Title  Pt will be independent with HEP     Status  Partially Met met for current       PT SHORT TERM GOAL #2   Title   Pt will report pain that is no greater than 7/10 at worst    Status  On-going rises to 10/10      PT SHORT TERM GOAL #3   Title  Pt will report ability to walk to the mailbox and back to her house without increased pain    Status  On-going        PT Long Term Goals - 11/01/17 1822      PT LONG TERM GOAL #1   Title  Pt will be able to walk 300 ft without increased pain to improve ability to walk around a store    Status  On-going      PT LONG TERM GOAL #2   Title  Pt will have global LE strength of 4/5 to improve tolerance to activities    Status  Partially Met      PT LONG TERM GOAL #3   Title  Pt will be able to stand for >/= 15 min without increased pain to improve tolerance to activities of daily living such as cooking    Status  On-going      PT LONG TERM GOAL #4   Title  Pt will report pain at worst no greater than 5/10    Status  On-going            Plan - 11/01/17 1823    Clinical Impression Statement  [name] seen today reporting increased LBP and hip pain without known trigger.  Session time somewhat limited by pt. arriving late to treatment.  Focus of today's visit was manual therapy to reduce pt. tone and tenderness in L proximal hip musculature with good relief noted by pt. following this.  Ended session with moist heat to lumbar and L buttocks along with E-stim for hopeful reduction in pain and tone.  Pt. leaving session noting much relief from pain levels.  [name] able to demo improvement in lateral hip strength today with MMT partially meeting goal.  Still feels she is most limited by back pain with walking ~ 200 ft and with significant rise in pain ambulating 300 ft in session today with SPC.  Pt. will continue to benefit from further skilled therapy to maximize functional mobility and improve strength.      PT Treatment/Interventions  ADLs/Self Care Home Management;Cryotherapy;Electrical Stimulation;Iontophoresis 91m/ml Dexamethasone;Moist Heat;Ultrasound;Gait  training;Stair training;Functional mobility training;Therapeutic activities;Therapeutic exercise;DME Instruction;Patient/family education;Manual techniques;Taping;Dry needling    Consulted and Agree with Plan of Care  Patient       Patient will benefit from skilled therapeutic intervention in order to improve the following deficits and impairments:  Abnormal gait, Decreased endurance, Decreased activity tolerance, Decreased strength, Pain, Difficulty walking, Decreased mobility, Increased muscle spasms, Improper body mechanics  Visit Diagnosis: Muscle weakness (generalized)  Difficulty in walking, not elsewhere classified  Chronic left-sided low back pain with left-sided sciatica     Problem List Patient Active Problem List   Diagnosis Date Noted  . Scapholunate ligament injury, no instability 06/29/2017  . HTN (hypertension) 09/27/2016  . Dyspnea 09/27/2016  . Shingles outbreak 10/20/2015  . 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  . Hypothyroidism 07/10/2015  . OSA (obstructive sleep apnea) 07/10/2015  . Endometrial hyperplasia 07/02/2015  .  Bronchitis 06/01/2015  . Cough variant asthma 11/02/2014  . Diabetes mellitus ([place])   . Obesity     Bess Harvest, [place] 11/01/17 6:32 PM   [facility] [address]  [place], [place], [address] Phone: [phone]   Fax:  [phone]  Name: [name] MRN: [id number] Date of Birth: 30-Dec-1950

## 2017-11-03 ENCOUNTER — Encounter: Payer: Self-pay | Admitting: Family Medicine

## 2017-11-03 ENCOUNTER — Encounter: Payer: Self-pay | Admitting: *Deleted

## 2017-11-03 ENCOUNTER — Ambulatory Visit (INDEPENDENT_AMBULATORY_CARE_PROVIDER_SITE_OTHER): Payer: Medicare Other | Admitting: Family Medicine

## 2017-11-03 VITALS — BP 148/72 | HR 77 | Temp 98.0°F | Resp 16 | Ht 61.0 in | Wt 294.2 lb

## 2017-11-03 DIAGNOSIS — M659 Synovitis and tenosynovitis, unspecified: Secondary | ICD-10-CM | POA: Diagnosis not present

## 2017-11-03 DIAGNOSIS — M47816 Spondylosis without myelopathy or radiculopathy, lumbar region: Secondary | ICD-10-CM

## 2017-11-03 DIAGNOSIS — G894 Chronic pain syndrome: Secondary | ICD-10-CM

## 2017-11-03 DIAGNOSIS — R5381 Other malaise: Secondary | ICD-10-CM

## 2017-11-03 DIAGNOSIS — G8929 Other chronic pain: Secondary | ICD-10-CM | POA: Insufficient documentation

## 2017-11-03 MED ORDER — PREDNISONE 10 MG PO TABS: ORAL_TABLET | ORAL | 0 refills | Status: DC

## 2017-11-03 NOTE — Progress Notes (Signed)
OFFICE VISIT  11/03/2017   CC:  Chief Complaint  Patient presents with  . Follow-up    chronic pain   HPI:    Patient is a 67 y.o. Caucasian female who presents for chronic pain syndrome: Diffuse osteoarthritic pain: knees, wrists, ankles, hips, feet, low back. Started vicodin for her 1 mo ago to try to improve her level of functioning and improve quality of life. Medication and dose: vicodin 5/325, 1-2 bid prn, #60-->only one rx given last visit. Overall her pain responds well to taking 1-2 vicodin at night--helps so she can rest.  Makes her a bit woozy so not taking in daytime.  Main pain is low back, R wrist and hand.  PT going fine but it does cause her pain. She asks about getting a manual WC today.  She can walk using cane about 50 steps and has severe pain in hips, back, knees and it makes getting out and doing things difficult.  Four day hx of more pain in R ankle--anterior aspect--much worse with ankle flexion/extension, esp pushing gas pedal/has to reach a lot to do this. No trauma.  No swelling or redness.  Taking meloxicam daily and tylenol arthritis.    DM: A1c up from 8.7 to 9.4% one mo ago.  I recommended she increase NPH insulin to 65 U at BF, 65 U at mid-day, and 61 U in evenings. Continue current sliding scale novolog at meals. Labs otherwise stable last visit.  Since insulin changes she notes 120s fasting but doesn't check it later in the day.  Past Medical History:  Diagnosis Date  . Allergic rhinitis    Allergy testing: results pending as of 05/28/16 (Dr. Harold Hedge)  . Anemia   . Arthritis   . Cellulitis 5/12   Left (Since replacemnt of Left knee  . Chronic renal insufficiency, stage II (mild) 2015  . Complex endometrial hyperplasia with atypia 12/12   Most recent endometrial bx was 10/2012-NEG  . Cough variant asthma   . DDD (degenerative disc disease), lumbar   . Diabetes mellitus ([place])   . Encounter for insertion of mirena IUD 5/12  . Fracture, foot  8/14   Hx of left foot stress fracture  . GERD (gastroesophageal reflux disease)   . Hemorrhoids   . Herpes zoster 10/2015   L side belt-line  . History of adenomatous polyp of colon 02/25/13   5 mm cecal polyp removed by Dr. Trevor Mace 5 yrs  . History of blood transfusion   . Hyperlipidemia    Not on statin b/c lipids "stayed down when sugars came down" per pt.  She says Dr. Chalmers Cater knows she is not on statin anymore.  . Hypertension   . Hypothyroidism   . Nephrolithiasis 4/07  . Obesity   . OSA on CPAP   . Osteoarthritis of both knees    Bilat TKA  . Osteoarthritis of right ankle and foot   . Osteoarthritis of right wrist    + ? scapholunate ligament disruption (x-ray 06/2017)--ortho referral.  . Sciatica of left side   . Severe persistent asthma    cough-variant--saw Allergist 05/25/16 and was switched from max dose advair to symbicort.  . Systolic murmur    ECHO fine 10/2016-->murmur likely flow murmur assoc with HTN.  Marland Kitchen UTI (lower urinary tract infection)   . Zoon's vulvitis    Bx-proven (GYN) -lichen sclerosis.  Clobetasol 0.05% ointment per GYN    Past Surgical History:  Procedure Laterality Date  . ANKLE FRACTURE  SURGERY  1984   Pin & repair  . ARTHROSCOPIC REPAIR ACL  2000   Due to ACL tear  . ARTHROSCOPIC REPAIR ACL  5/08  . Blateral knee rerlacements x4 2 times each knee    . BREAST BIOPSY Right 2014   Benign  . CARDIAC CATHETERIZATION  5/07   clear vessel mild mitral   . CARPAL TUNNEL RELEASE Right [id number]   1989 Left  . CHOLECYSTECTOMY  1988  . COLONOSCOPY N/A 02/25/2013   Tubular adenoma x 1: Recall 5 yrs. Procedure: COLONOSCOPY;  Surgeon: Juanita Craver, MD;  Location: [facility];  Service: Endoscopy;  Laterality: N/A;  . COMBINED HYSTEROSCOPY DIAGNOSTIC / D&C  2/12   Bx neg  . DEXA  08/2007   Bone density normal.  . DILATION AND CURETTAGE OF UTERUS  12/11  . REPLACEMENT TOTAL KNEE Left 5/12   X 2 on each  . TRANSTHORACIC ECHOCARDIOGRAM  10/11/2016     EF 55-60%, grd I DD.  . TUBAL LIGATION  1985   C-Section    Outpatient Medications Prior to Visit  Medication Sig Dispense Refill  . albuterol (PROVENTIL HFA;VENTOLIN HFA) 108 (90 Base) MCG/ACT inhaler Inhale 2 puffs into lungs every 6 hours as needed for wheezing or shortness of breath Dx: J45.50 18 Inhaler 3  . albuterol (PROVENTIL) (2.5 MG/3ML) 0.083% nebulizer solution Take 3 mLs (2.5 mg total) by nebulization every 4 (four) hours as needed for wheezing or shortness of breath (Dx: J45.50). 75 mL 1  . aspirin 81 MG tablet Take 81 mg by mouth daily.    . clobetasol ointment (TEMOVATE) 0.05 % APPLY TO AFFECTED AREA TWICE A DAY (Patient taking differently: APPLY TO AFFECTED AREA TWICE A DAY prn) 60 g 1  . enalapril (VASOTEC) 10 MG tablet TAKE 1 TABLET BY MOUTH EVERY DAY 90 tablet 1  . fluticasone (CUTIVATE) 0.05 % cream Apply to affected area of R lower leg twice per day 30 g 2  . furosemide (LASIX) 20 MG tablet Take 1 tablet (20 mg total) daily by mouth. 90 tablet 1  . HYDROcodone-acetaminophen (NORCO/VICODIN) 5-325 MG tablet Take 1-2 tablets by mouth 2 (two) times daily as needed for moderate pain. 60 tablet 0  . IBUPROFEN PO Take by mouth as needed.     . insulin aspart (NOVOLOG) 100 UNIT/ML injection USE PER SLIDING SCALE AT EACH MEAL (AVG USE IS 17 UNITS TID) 60 mL 1  . insulin NPH Human (NOVOLIN N) 100 UNIT/ML injection 60 U SQ qAM, 60 U SQ mid day, and 59 U SQ q evening (Patient taking differently: 65 U SQ qAM, 65 U SQ mid day, and 61 U SQ q evening) 15 vial 1  . Insulin Syringes, Disposable, U-100 0.3 ML MISC Use to inject insulin--6 injections per day 540 each 3  . levocetirizine (XYZAL) 5 MG tablet Take 1 tablet daily as needed by mouth.  5  . levothyroxine (SYNTHROID, LEVOTHROID) 125 MCG tablet TAKE 1 TABLET BY MOUTH EVERY MORNING ON AN EMPTY STOMACH 90 tablet 1  . MELATONIN PO Take by mouth.    . meloxicam (MOBIC) 15 MG tablet 1 tab po qd prn 30 tablet 3  . metFORMIN  (GLUCOPHAGE) 500 MG tablet TAKE 1 TABLET BY MOUTH EVERY MORNING AND 2 TABLETS BY MOUTH AT BEDTIME 270 tablet 1  . montelukast (SINGULAIR) 10 MG tablet Take 1 tablet by mouth daily.    . pravastatin (PRAVACHOL) 20 MG tablet TAKE 1 TABLET BY MOUTH EVERY DAY 90 tablet  2  . SYMBICORT 160-4.5 MCG/ACT inhaler Inhale 2 puffs into the lungs 2 (two) times daily.    . clindamycin (CLEOCIN) 300 MG capsule Take 1 capsule (300 mg total) by mouth 3 (three) times daily. (Patient not taking: Reported on 10/04/2017) 15 capsule 0   No facility-administered medications prior to visit.     Allergies  Allergen Reactions  . Adhesive [Tape]   . Banana     Nausea and diarrhea  . Eggs Or Egg-Derived Products     Nausea and diarrhea  . Latex     sensativity  . Penicillins Rash  . Pine Swelling and Rash  . Rose Swelling and Rash    ROS As per HPI  PE: Blood pressure (!) 148/72, pulse 77, temperature 98 F (36.7 C), temperature source Oral, resp. rate 16, height 5\' 1"  (1.549 m), weight 294 lb 4 oz (133.5 kg), last menstrual period 08/10/2010, SpO2 94 %. Gen: Alert, well appearing.  Patient is oriented to person, place, time, and situation. AFFECT: pleasant, lucid thought and speech. Right ankle with TTP anteriorly in midline, extending up into distal tibia region some. No erythema, no swelling.  Pain increases with resisted extension and resisted flexion.  ROM of ankle is intact.  DP and PT pulses palpable. No edema.  LABS:  Lab Results  Component Value Date   TSH 1.49 02/13/2017   Lab Results  Component Value Date   WBC 8.0 10/04/2017   HGB 11.7 (L) 10/04/2017   HCT 35.2 (L) 10/04/2017   MCV 91.8 10/04/2017   PLT 251.0 10/04/2017   Lab Results  Component Value Date   CREATININE 0.89 10/04/2017   BUN 29 (H) 10/04/2017   NA 141 10/04/2017   K 4.7 10/04/2017   CL 105 10/04/2017   CO2 28 10/04/2017   Lab Results  Component Value Date   ALT 20 02/13/2017   AST 21 02/13/2017   ALKPHOS 55  02/13/2017   BILITOT 0.4 02/13/2017   Lab Results  Component Value Date   CHOL 144 02/13/2017   Lab Results  Component Value Date   HDL 31.80 (L) 02/13/2017   Lab Results  Component Value Date   LDLCALC 75 02/13/2017   Lab Results  Component Value Date   TRIG 184.0 (H) 02/13/2017   Lab Results  Component Value Date   CHOLHDL 5 02/13/2017   Lab Results  Component Value Date   HGBA1C 9.4 (H) 10/04/2017    IMPRESSION AND PLAN:  1) Chronic pain syndrome (osteoarthritis of multiple sites): she is better using vicodin, and she is using this pretty sparingly--about 2-3 times per week at night, usually 1 pill at a time. We'll continue this. No new rx for this was needed today. Controlled substance contract reviewed with patient today.  Patient signed this and it will be placed in the chart.    2) Acute R ankle pain: c/w extensor tenosynovitis.  Hold meloxicam. Prednisone 20mg  qd x 5d, then 10mg  qd x 5d.  3) DM 2; sounds like fasting glucoses improved the last month.  She'll start monitoring glucose before supper as well.  4) Debilitated pt/gait abnormality/morbid obesity/chronic pain syndrome: I gave pt rx for manual wheelchair to take to medical supply store.  An After Visit Summary was printed and given to the patient.  FOLLOW UP: Return in about 2 months (around 01/04/2018) for routine chronic illness f/u.  Signed:  Crissie Sickles, MD           11/03/2017

## 2017-11-03 NOTE — Patient Instructions (Signed)
Stop meloxicam while you are taking prednisone.

## 2017-11-06 ENCOUNTER — Ambulatory Visit: Payer: Medicare Other

## 2017-11-06 DIAGNOSIS — M5442 Lumbago with sciatica, left side: Secondary | ICD-10-CM

## 2017-11-06 DIAGNOSIS — G8929 Other chronic pain: Secondary | ICD-10-CM | POA: Diagnosis not present

## 2017-11-06 DIAGNOSIS — M6281 Muscle weakness (generalized): Secondary | ICD-10-CM

## 2017-11-06 DIAGNOSIS — R262 Difficulty in walking, not elsewhere classified: Secondary | ICD-10-CM

## 2017-11-06 NOTE — Therapy (Signed)
[facility] [place] [address]  [place], [place], [address] Phone: [phone]   Fax:  [phone]  Physical Therapy Treatment  Patient Details  Name: [name] MRN: [id number] Date of Birth: [birth year] Referring Provider: Ricardo Jericho, MD   Encounter Date: 11/06/2017  PT End of Session - 11/06/17 1544    Visit Number  6    Number of Visits  17    Date for PT Re-Evaluation  12/04/17    Authorization Type  Medicare    PT Start Time  2683    PT Stop Time  1618    PT Time Calculation (min)  40 min    Activity Tolerance  Patient tolerated treatment well    Behavior During Therapy  [facility] for tasks assessed/performed       Past Medical History:  Diagnosis Date  . Allergic rhinitis    Allergy testing: results pending as of 05/28/16 (Dr. Harold Hedge)  . Anemia   . Arthritis   . Cellulitis 5/12   Left (Since replacemnt of Left knee  . Chronic renal insufficiency, stage II (mild) 2015  . Complex endometrial hyperplasia with atypia 12/12   Most recent endometrial bx was 10/2012-NEG  . Cough variant asthma   . DDD (degenerative disc disease), lumbar   . Diabetes mellitus ([place])   . Encounter for insertion of mirena IUD 5/12  . Fracture, foot 8/14   Hx of left foot stress fracture  . GERD (gastroesophageal reflux disease)   . Hemorrhoids   . Herpes zoster 10/2015   L side belt-line  . History of adenomatous polyp of colon 02/25/13   5 mm cecal polyp removed by Dr. Trevor Mace 5 yrs  . History of blood transfusion   . Hyperlipidemia    Not on statin b/c lipids "stayed down when sugars came down" per pt.  She says Dr. Chalmers Cater knows she is not on statin anymore.  . Hypertension   . Hypothyroidism   . Nephrolithiasis 4/07  . Obesity   . OSA on CPAP   . Osteoarthritis of both knees    Bilat TKA  . Osteoarthritis of right ankle and foot   . Osteoarthritis of right wrist    + ? scapholunate ligament disruption (x-ray  06/2017)--ortho referral.  . Sciatica of left side   . Severe persistent asthma    cough-variant--saw Allergist 05/25/16 and was switched from max dose advair to symbicort.  . Systolic murmur    ECHO fine 10/2016-->murmur likely flow murmur assoc with HTN.  Marland Kitchen UTI (lower urinary tract infection)   . Zoon's vulvitis    Bx-proven (GYN) -lichen sclerosis.  Clobetasol 0.05% ointment per GYN    Past Surgical History:  Procedure Laterality Date  . ANKLE FRACTURE SURGERY  1984   Pin & repair  . ARTHROSCOPIC REPAIR ACL  2000   Due to ACL tear  . ARTHROSCOPIC REPAIR ACL  5/08  . Blateral knee rerlacements x4 2 times each knee    . BREAST BIOPSY Right 2014   Benign  . CARDIAC CATHETERIZATION  5/07   clear vessel mild mitral   . CARPAL TUNNEL RELEASE Right [id number]   1989 Left  . CHOLECYSTECTOMY  1988  . COLONOSCOPY N/A 02/25/2013   Tubular adenoma x 1: Recall 5 yrs. Procedure: COLONOSCOPY;  Surgeon: Juanita Craver, MD;  Location: [facility];  Service: Endoscopy;  Laterality: N/A;  . COMBINED HYSTEROSCOPY DIAGNOSTIC / D&C  2/12   Bx neg  .  DEXA  08/2007   Bone density normal.  . DILATION AND CURETTAGE OF UTERUS  12/11  . REPLACEMENT TOTAL KNEE Left 5/12   X 2 on each  . TRANSTHORACIC ECHOCARDIOGRAM  10/11/2016    EF 55-60%, grd I DD.  . TUBAL LIGATION  1985   C-Section    There were no vitals filed for this visit.  Subjective Assessment - 11/06/17 1541    Subjective  Pt. noting MD told her she had tendonitis in R ankle.      Patient Stated Goals  to decrease pain    Currently in Pain?  Yes    Pain Score  7     Pain Location  Hip    Pain Orientation  Left;Posterior    Pain Descriptors / Indicators  Aching    Pain Type  Chronic pain    Pain Onset  More than a month ago    Pain Frequency  Intermittent                       [facility] Adult PT Treatment/Exercise - 11/06/17 1549      Knee/Hip Exercises: Stretches   Passive Hamstring Stretch  Right;1 rep;Left;30  seconds      Knee/Hip Exercises: Aerobic   Nustep  Lvl 4, 7 min       Knee/Hip Exercises: Standing   Hip Abduction  Right;Left;10 reps;Knee straight;Stengthening    Abduction Limitations  red looped TB at ankle; at chair     Hip Extension  Right;Left;10 reps;Knee straight;Stengthening    Extension Limitations  red looped TB at ankles; at chair     Functional Squat  15 reps;3 seconds    Functional Squat Limitations  chair       Knee/Hip Exercises: Seated   Clamshell with TheraBand  Red 2 x 20 with red TB at knees       Knee/Hip Exercises: Supine   Bridges  10 reps      Manual Therapy   Manual Therapy  Soft tissue mobilization;Passive ROM    Manual therapy comments  R sidelying     Soft tissue mobilization  STM to L glute, piriformis, TFL with L LE elevated on bolster     Passive ROM  Manual L glute, KTOS stretch as able x 30 sec              PT Education - 11/06/17 1728    Education Details  red long TB issued to pt. for seatd clam shell     Person(s) Educated  Patient    Methods  Explanation;Demonstration;Verbal cues;Handout    Comprehension  Verbalized understanding;Returned demonstration;Verbal cues required;Need further instruction       PT Short Term Goals - 11/01/17 1601      PT SHORT TERM GOAL #1   Title  Pt will be independent with HEP     Status  Partially Met met for current       PT SHORT TERM GOAL #2   Title  Pt will report pain that is no greater than 7/10 at worst    Status  On-going rises to 10/10      PT SHORT TERM GOAL #3   Title  Pt will report ability to walk to the mailbox and back to her house without increased pain    Status  On-going        PT Long Term Goals - 11/01/17 1822      PT LONG TERM GOAL #1     Title  Pt will be able to walk 300 ft without increased pain to improve ability to walk around a store    Status  On-going      PT LONG TERM GOAL #2   Title  Pt will have global LE strength of 4/5 to improve tolerance to activities     Status  Partially Met      PT LONG TERM GOAL #3   Title  Pt will be able to stand for >/= 15 min without increased pain to improve tolerance to activities of daily living such as cooking    Status  On-going      PT LONG TERM GOAL #4   Title  Pt will report pain at worst no greater than 5/10    Status  On-going            Plan - 11/06/17 1545    Clinical Impression Statement  Pt. reporting her pain levels are somewhat improved today however is now on prednisone as prescribed from MD at recent f/u after pt. reports she has ankle tendonitis.  Tolerated progression of seated and standing lumbopelvic strengthening activities well today.  LBP remains unchanged throughout session today.  Pt. does present with tenderness in L buttocks/piriformis area which was addreseed with manual LE stretching and STM.  Will monitor resposne at upcomign visit.      Clinical Impairments Affecting Rehab Potential  Chronicity of condition, complex medical history    PT Treatment/Interventions  ADLs/Self Care Home Management;Cryotherapy;Electrical Stimulation;Iontophoresis 4mg/ml Dexamethasone;Moist Heat;Ultrasound;Gait training;Stair training;Functional mobility training;Therapeutic activities;Therapeutic exercise;DME Instruction;Patient/family education;Manual techniques;Taping;Dry needling    Consulted and Agree with Plan of Care  Patient       Patient will benefit from skilled therapeutic intervention in order to improve the following deficits and impairments:  Abnormal gait, Decreased endurance, Decreased activity tolerance, Decreased strength, Pain, Difficulty walking, Decreased mobility, Increased muscle spasms, Improper body mechanics  Visit Diagnosis: Muscle weakness (generalized)  Difficulty in walking, not elsewhere classified  Chronic left-sided low back pain with left-sided sciatica     Problem List Patient Active Problem List   Diagnosis Date Noted  . Chronic pain 11/03/2017  .  Scapholunate ligament injury, no instability 06/29/2017  . HTN (hypertension) 09/27/2016  . Dyspnea 09/27/2016  . Shingles outbreak 10/20/2015  . 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  . Hypothyroidism 07/10/2015  . OSA (obstructive sleep apnea) 07/10/2015  . Endometrial hyperplasia 07/02/2015  . Bronchitis 06/01/2015  . Cough variant asthma 11/02/2014  . Diabetes mellitus ([facility])   . Obesity      , PTA 11/06/17 5:29 PM   [facility] [address]  [address] [place], [place], [address] Phone: [phone]   Fax:  [phone]  Name: [name] MRN: [id number] Date of Birth: 07/27/1950   

## 2017-11-08 ENCOUNTER — Ambulatory Visit: Payer: Medicare Other

## 2017-11-08 DIAGNOSIS — M6281 Muscle weakness (generalized): Secondary | ICD-10-CM

## 2017-11-08 DIAGNOSIS — R262 Difficulty in walking, not elsewhere classified: Secondary | ICD-10-CM

## 2017-11-08 DIAGNOSIS — G8929 Other chronic pain: Secondary | ICD-10-CM | POA: Diagnosis not present

## 2017-11-08 DIAGNOSIS — M5442 Lumbago with sciatica, left side: Secondary | ICD-10-CM

## 2017-11-08 NOTE — Therapy (Signed)
[facility] [address]  [place], [place], [address] Phone: [phone]   Fax:  [phone]  Physical Therapy Treatment  Patient Details  Name: [name] MRN: [id number] Date of Birth: 30-Mar-1951 Referring Provider: Ricardo Jericho, MD   Encounter Date: 11/08/2017  PT End of Session - 11/08/17 1539    Visit Number  7    Number of Visits  17    Date for PT Re-Evaluation  12/04/17    Authorization Type  Medicare    PT Start Time  1530    PT Stop Time  1625    PT Time Calculation (min)  55 min    Activity Tolerance  Patient tolerated treatment well    Behavior During Therapy  [facility] for tasks assessed/performed       Past Medical History:  Diagnosis Date  . Allergic rhinitis    Allergy testing: results pending as of 05/28/16 (Dr. Harold Hedge)  . Anemia   . Arthritis   . Cellulitis 5/12   Left (Since replacemnt of Left knee  . Chronic renal insufficiency, stage II (mild) 2015  . Complex endometrial hyperplasia with atypia 12/12   Most recent endometrial bx was 10/2012-NEG  . Cough variant asthma   . DDD (degenerative disc disease), lumbar   . Diabetes mellitus ([place])   . Encounter for insertion of mirena IUD 5/12  . Fracture, foot 8/14   Hx of left foot stress fracture  . GERD (gastroesophageal reflux disease)   . Hemorrhoids   . Herpes zoster 10/2015   L side belt-line  . History of adenomatous polyp of colon 02/25/13   5 mm cecal polyp removed by Dr. Trevor Mace 5 yrs  . History of blood transfusion   . Hyperlipidemia    Not on statin b/c lipids "stayed down when sugars came down" per pt.  She says Dr. Chalmers Cater knows she is not on statin anymore.  . Hypertension   . Hypothyroidism   . Nephrolithiasis 4/07  . Obesity   . OSA on CPAP   . Osteoarthritis of both knees    Bilat TKA  . Osteoarthritis of right ankle and foot   . Osteoarthritis of right wrist    + ? scapholunate ligament disruption (x-ray  06/2017)--ortho referral.  . Sciatica of left side   . Severe persistent asthma    cough-variant--saw Allergist 05/25/16 and was switched from max dose advair to symbicort.  . Systolic murmur    ECHO fine 10/2016-->murmur likely flow murmur assoc with HTN.  Marland Kitchen UTI (lower urinary tract infection)   . Zoon's vulvitis    Bx-proven (GYN) -lichen sclerosis.  Clobetasol 0.05% ointment per GYN    Past Surgical History:  Procedure Laterality Date  . ANKLE FRACTURE SURGERY  1984   Pin & repair  . ARTHROSCOPIC REPAIR ACL  2000   Due to ACL tear  . ARTHROSCOPIC REPAIR ACL  5/08  . Blateral knee rerlacements x4 2 times each knee    . BREAST BIOPSY Right 2014   Benign  . CARDIAC CATHETERIZATION  5/07   clear vessel mild mitral   . CARPAL TUNNEL RELEASE Right [id number]   1989 Left  . CHOLECYSTECTOMY  1988  . COLONOSCOPY N/A 02/25/2013   Tubular adenoma x 1: Recall 5 yrs. Procedure: COLONOSCOPY;  Surgeon: Juanita Craver, MD;  Location: [facility];  Service: Endoscopy;  Laterality: N/A;  . COMBINED HYSTEROSCOPY DIAGNOSTIC / D&C  2/12   Bx neg  .  DEXA  08/2007   Bone density normal.  . DILATION AND CURETTAGE OF UTERUS  12/11  . REPLACEMENT TOTAL KNEE Left 5/12   X 2 on each  . TRANSTHORACIC ECHOCARDIOGRAM  10/11/2016    EF 55-60%, grd I DD.  . TUBAL LIGATION  1985   C-Section    There were no vitals filed for this visit.  Subjective Assessment - 11/08/17 1538    Subjective  Noting decreased pain this morning however has had increased hip pain since walking up to therapy today.      Patient Stated Goals  to decrease pain    Currently in Pain?  Yes    Pain Score  5     Pain Location  Hip    Pain Orientation  Left;Posterior    Pain Descriptors / Indicators  Aching    Pain Type  Chronic pain    Pain Radiating Towards  extending into back while standing     Pain Onset  More than a month ago    Pain Frequency  Intermittent    Aggravating Factors   walking and standing     Pain Relieving  Factors  Hydrococone, sitting,     Multiple Pain Sites  No                       [facility] Adult PT Treatment/Exercise - 11/08/17 1552      Lumbar Exercises: Standing   Row  Both;10 reps;Strengthening Cues for scap. retraction     Theraband Level (Row)  Level 3 (Green)    Shoulder Extension  Both;10 reps;Theraband Cues for scap. retraction     Theraband Level (Shoulder Extension)  Level 2 (Red)    Other Standing Lumbar Exercises  B pallof press with green TB       Knee/Hip Exercises: Stretches   Passive Hamstring Stretch  Right;1 rep;Left;30 seconds    Passive Hamstring Stretch Limitations  seated with heel proper on bolster      Knee/Hip Exercises: Aerobic   Nustep  Lvl 4, 7 min       Knee/Hip Exercises: Standing   Hip Abduction  Right;Left;Knee straight;Stengthening x 12 reps     Abduction Limitations  red looped TB at ankle; at chair     Hip Extension  Right;Left;Knee straight;Stengthening x 12 reps     Extension Limitations  red looped TB at ankles; at chair       Knee/Hip Exercises: Seated   Marching  Right;Left;10 reps;Strengthening    Marching Limitations  red looped band at knes     Hamstring Curl  Right;Left;Strengthening;15 reps added to HEP with looped red TB     Hamstring Limitations  red looped TB     Sit to Sand  without UE support;10 reps      Moist Heat Therapy   Number Minutes Moist Heat  10 Minutes    Moist Heat Location  Lumbar Spine      Electrical Stimulation   Electrical Stimulation Location  lumbar spine     Electrical Stimulation Action  IFC    Electrical Stimulation Parameters  to tolerance, 10'    Electrical Stimulation Goals  Pain      Manual Therapy   Manual Therapy  Soft tissue mobilization;Passive ROM    Manual therapy comments  R sidelying     Soft tissue mobilization  STM to L glute, piriformis              PT Education -  11/08/17 1800    Education Details  HEp update     Person(s) Educated  Patient    Methods   Explanation;Demonstration;Verbal cues;Handout    Comprehension  Verbalized understanding;Verbal cues required;Returned demonstration;Need further instruction       PT Short Term Goals - 11/01/17 1601      PT SHORT TERM GOAL #1   Title  Pt will be independent with HEP     Status  Partially Met met for current       PT SHORT TERM GOAL #2   Title  Pt will report pain that is no greater than 7/10 at worst    Status  On-going rises to 10/10      PT SHORT TERM GOAL #3   Title  Pt will report ability to walk to the mailbox and back to her house without increased pain    Status  On-going        PT Long Term Goals - 11/01/17 1822      PT LONG TERM GOAL #1   Title  Pt will be able to walk 300 ft without increased pain to improve ability to walk around a store    Status  On-going      PT LONG TERM GOAL #2   Title  Pt will have global LE strength of 4/5 to improve tolerance to activities    Status  Partially Met      PT LONG TERM GOAL #3   Title  Pt will be able to stand for >/= 15 min without increased pain to improve tolerance to activities of daily living such as cooking    Status  On-going      PT LONG TERM GOAL #4   Title  Pt will report pain at worst no greater than 5/10    Status  On-going            Plan - 11/08/17 1540    Clinical Impression Statement  [name] reporting her hip and LBP levels have been somewhat decreased last few days however has increased hip pain since walking from car to session today.  Tolerated progression of proximal hip strengthening, addition of standing Pallof press, row, and extension row with band resistance well today.  Is limited to ~ 2 min standing activities before requiring sitting rest break due to back pain thus standing activities limited today.  HEP updated.  Ended session with E-stim/moist heat to lumbar and hip as pt. noting good relief from this last visit.  Ended session with pt. noting decreased pain from initial report.  Will  continue to progress toward goals.      PT Treatment/Interventions  ADLs/Self Care Home Management;Cryotherapy;Electrical Stimulation;Iontophoresis 54m/ml Dexamethasone;Moist Heat;Ultrasound;Gait training;Stair training;Functional mobility training;Therapeutic activities;Therapeutic exercise;DME Instruction;Patient/family education;Manual techniques;Taping;Dry needling    Consulted and Agree with Plan of Care  Patient       Patient will benefit from skilled therapeutic intervention in order to improve the following deficits and impairments:  Abnormal gait, Decreased endurance, Decreased activity tolerance, Decreased strength, Pain, Difficulty walking, Decreased mobility, Increased muscle spasms, Improper body mechanics  Visit Diagnosis: Muscle weakness (generalized)  Difficulty in walking, not elsewhere classified  Chronic left-sided low back pain with left-sided sciatica     Problem List Patient Active Problem List   Diagnosis Date Noted  . Chronic pain 11/03/2017  . Scapholunate ligament injury, no instability 06/29/2017  . HTN (hypertension) 09/27/2016  . Dyspnea 09/27/2016  . Shingles outbreak 10/20/2015  . 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  .  Hypothyroidism 07/10/2015  . OSA (obstructive sleep apnea) 07/10/2015  . Endometrial hyperplasia 07/02/2015  . Bronchitis 06/01/2015  . Cough variant asthma 11/02/2014  . Diabetes mellitus ([place])   . Obesity     Bess Harvest, [place] 11/08/17 6:06 PM   [facility] [address]  [place], [place], [address] Phone: [phone]   Fax:  [phone]  Name: [name] MRN: [id number] Date of Birth: 26-Oct-1950

## 2017-11-13 ENCOUNTER — Encounter: Payer: Self-pay | Admitting: Physical Therapy

## 2017-11-13 ENCOUNTER — Ambulatory Visit: Payer: Medicare Other | Attending: Family Medicine | Admitting: Physical Therapy

## 2017-11-13 DIAGNOSIS — M6281 Muscle weakness (generalized): Secondary | ICD-10-CM | POA: Diagnosis not present

## 2017-11-13 DIAGNOSIS — R262 Difficulty in walking, not elsewhere classified: Secondary | ICD-10-CM | POA: Diagnosis not present

## 2017-11-13 DIAGNOSIS — G8929 Other chronic pain: Secondary | ICD-10-CM | POA: Insufficient documentation

## 2017-11-13 DIAGNOSIS — M5442 Lumbago with sciatica, left side: Secondary | ICD-10-CM | POA: Diagnosis not present

## 2017-11-13 NOTE — Therapy (Addendum)
[facility] [place] [address]  [place], [place], [address] Phone: [phone]   Fax:  [phone]  Physical Therapy Treatment  Patient Details  Name: [name] MRN: [id number] Date of Birth: [birth year] Referring Provider: Ricardo Jericho, MD   Encounter Date: 11/13/2017  PT End of Session - 11/13/17 1538    Visit Number  8    Number of Visits  17    Date for PT Re-Evaluation  12/04/17    Authorization Type  Medicare    PT Start Time  1534    PT Stop Time  1622    PT Time Calculation (min)  48 min    Activity Tolerance  Patient tolerated treatment well    Behavior During Therapy  [facility] for tasks assessed/performed       Past Medical History:  Diagnosis Date  . Allergic rhinitis    Allergy testing: results pending as of 05/28/16 (Dr. Harold Hedge)  . Anemia   . Arthritis   . Cellulitis 5/12   Left (Since replacemnt of Left knee  . Chronic renal insufficiency, stage II (mild) 2015  . Complex endometrial hyperplasia with atypia 12/12   Most recent endometrial bx was 10/2012-NEG  . Cough variant asthma   . DDD (degenerative disc disease), lumbar   . Diabetes mellitus ([place])   . Encounter for insertion of mirena IUD 5/12  . Fracture, foot 8/14   Hx of left foot stress fracture  . GERD (gastroesophageal reflux disease)   . Hemorrhoids   . Herpes zoster 10/2015   L side belt-line  . History of adenomatous polyp of colon 02/25/13   5 mm cecal polyp removed by Dr. Trevor Mace 5 yrs  . History of blood transfusion   . Hyperlipidemia    Not on statin b/c lipids "stayed down when sugars came down" per pt.  She says Dr. Chalmers Cater knows she is not on statin anymore.  . Hypertension   . Hypothyroidism   . Nephrolithiasis 4/07  . Obesity   . OSA on CPAP   . Osteoarthritis of both knees    Bilat TKA  . Osteoarthritis of right ankle and foot   . Osteoarthritis of right wrist    + ? scapholunate ligament disruption (x-ray  06/2017)--ortho referral.  . Sciatica of left side   . Severe persistent asthma    cough-variant--saw Allergist 05/25/16 and was switched from max dose advair to symbicort.  . Systolic murmur    ECHO fine 10/2016-->murmur likely flow murmur assoc with HTN.  Marland Kitchen UTI (lower urinary tract infection)   . Zoon's vulvitis    Bx-proven (GYN) -lichen sclerosis.  Clobetasol 0.05% ointment per GYN    Past Surgical History:  Procedure Laterality Date  . ANKLE FRACTURE SURGERY  1984   Pin & repair  . ARTHROSCOPIC REPAIR ACL  2000   Due to ACL tear  . ARTHROSCOPIC REPAIR ACL  5/08  . Blateral knee rerlacements x4 2 times each knee    . BREAST BIOPSY Right 2014   Benign  . CARDIAC CATHETERIZATION  5/07   clear vessel mild mitral   . CARPAL TUNNEL RELEASE Right [id number];0034   1989 Left  . CHOLECYSTECTOMY  1988  . COLONOSCOPY N/A 02/25/2013   Tubular adenoma x 1: Recall 5 yrs. Procedure: COLONOSCOPY;  Surgeon: Juanita Craver, MD;  Location: [facility];  Service: Endoscopy;  Laterality: N/A;  . COMBINED HYSTEROSCOPY DIAGNOSTIC / D&C  2/12   Bx neg  .  DEXA  08/2007   Bone density normal.  . DILATION AND CURETTAGE OF UTERUS  12/11  . REPLACEMENT TOTAL KNEE Left 5/12   X 2 on each  . TRANSTHORACIC ECHOCARDIOGRAM  10/11/2016    EF 55-60%, grd I DD.  . TUBAL LIGATION  1985   C-Section    There were no vitals filed for this visit.  Subjective Assessment - 11/13/17 1538    Subjective  Pt noting some higher pain levels today but states she finished a round of prednisone for her wrist yesterday. Continues to have difficulty with walking to her mailbox    Limitations  Standing;Walking    How long can you stand comfortably?  2-3 min    How long can you walk comfortably?  to the mailbox at her house and back to her house has gotten slightly better     Patient Stated Goals  to decrease pain    Currently in Pain?  Yes    Pain Score  5  almost a 9 with walking    Pain Location  Hip    Pain Orientation   Left;Posterior    Pain Descriptors / Indicators  Aching    Pain Type  Chronic pain    Aggravating Factors   Walking, standing    Pain Relieving Factors  Predinose, Medication, Sitting    Effect of Pain on Daily Activities  Difficulty walking to the mailbox    Multiple Pain Sites  No                       [facility] Adult PT Treatment/Exercise - 11/13/17 0001      Exercises   Exercises  Lumbar      Lumbar Exercises: Seated   Other Seated Lumbar Exercises  Sitting on orange PB in corner 2 x 1 min. Pt able to sit on and stand up from ball for 1 rep independently. Pt had increase in anxiety about this activity and therefore was CGA from PT. First time standing from ball pt was min assist to help up.       Knee/Hip Exercises: Aerobic   Nustep  L4 x 6 min      Knee/Hip Exercises: Standing   Forward Step Up  Both;10 reps;Hand Hold: 0;Step Height: 4"    Other Standing Knee Exercises  Standing on small incline x 30 sec then x 1 min with 1 UE support. Pt able to maintain balance with no UE support for ~15 seconds    Other Standing Knee Exercises  Marching in standing x 10 reps on each LE; Marching while standing on Airex pad x 10 reps with each LE; CGA from therapist and VC to prevent pt from marching forward off of AirEx      Moist Heat Therapy   Number Minutes Moist Heat  8 Minutes    Moist Heat Location  Lumbar Spine      Electrical Stimulation   Electrical Stimulation Location  Lumbar spine    Electrical Stimulation Action  IFC    Electrical Stimulation Parameters  to tolerance, length of session (35 min)    Electrical Stimulation Goals  Pain;Neuromuscular facilitation             PT Education - 11/13/17 1715    Education Details  Pt provided with introductory information into getting home TENs unit    Person(s) Educated  Patient    Methods  Explanation    Comprehension  Verbalized understanding  PT Short Term Goals - 11/13/17 1845      PT SHORT TERM  GOAL #1   Title  Pt will be independent with HEP     Status  Achieved      PT SHORT TERM GOAL #2   Title  Pt will report pain that is no greater than 7/10 at worst    Status  On-going rises to 10/10      PT SHORT TERM GOAL #3   Title  Pt will report ability to walk to the mailbox and back to her house without increased pain    Status  On-going        PT Long Term Goals - 11/01/17 1822      PT LONG TERM GOAL #1   Title  Pt will be able to walk 300 ft without increased pain to improve ability to walk around a store    Status  On-going      PT LONG TERM GOAL #2   Title  Pt will have global LE strength of 4/5 to improve tolerance to activities    Status  Partially Met      PT LONG TERM GOAL #3   Title  Pt will be able to stand for >/= 15 min without increased pain to improve tolerance to activities of daily living such as cooking    Status  On-going      PT LONG TERM GOAL #4   Title  Pt will report pain at worst no greater than 5/10    Status  On-going            Plan - 11/13/17 1720    Clinical Impression Statement  [name] is reporting increased pain levels today and small levels in overall improvement as far as her functional mobility goes. She continues to demonstrate severe increased fatigue and pain with functional activities and requires a break to sit down every few minutes. These impairments severely limit the speed with at which she can make progress. She demonstrates muscle atrophy and weakness of spinal extensor muscles, and pain relief with electrical stimulation, and therefore will benefit from a home estim unit to address these concerns. At this time, she will continue to benefit from physical therapy to address her functional deficits, and progress toward her goals.     Rehab Potential  Fair    Clinical Impairments Affecting Rehab Potential  Chronicity of condition, complex medical history    PT Treatment/Interventions  ADLs/Self Care Home  Management;Cryotherapy;Electrical Stimulation;Iontophoresis 49m/ml Dexamethasone;Moist Heat;Ultrasound;Gait training;Stair training;Functional mobility training;Therapeutic activities;Therapeutic exercise;DME Instruction;Patient/family education;Manual techniques;Taping;Dry needling    Consulted and Agree with Plan of Care  Patient       Patient will benefit from skilled therapeutic intervention in order to improve the following deficits and impairments:  Abnormal gait, Decreased endurance, Decreased activity tolerance, Decreased strength, Pain, Difficulty walking, Decreased mobility, Increased muscle spasms, Improper body mechanics  Visit Diagnosis: Muscle weakness (generalized)  Difficulty in walking, not elsewhere classified  Chronic left-sided low back pain with left-sided sciatica     Problem List Patient Active Problem List   Diagnosis Date Noted  . Chronic pain 11/03/2017  . Scapholunate ligament injury, no instability 06/29/2017  . HTN (hypertension) 09/27/2016  . Dyspnea 09/27/2016  . Shingles outbreak 10/20/2015  . 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  . Hypothyroidism 07/10/2015  . OSA (obstructive sleep apnea) 07/10/2015  . Endometrial hyperplasia 07/02/2015  . Bronchitis 06/01/2015  . Cough variant asthma 11/02/2014  . Diabetes mellitus ([place]   .  Obesity     Shirline Frees, SPT 11/13/2017, 6:46 PM  [facility] [address]  [place], [place], [address] Phone: [phone]   Fax:  [phone]  Name: [name] MRN: [id number] Date of Birth: [birth year]

## 2017-11-15 ENCOUNTER — Ambulatory Visit: Payer: Medicare Other

## 2017-11-15 DIAGNOSIS — R262 Difficulty in walking, not elsewhere classified: Secondary | ICD-10-CM

## 2017-11-15 DIAGNOSIS — M6281 Muscle weakness (generalized): Secondary | ICD-10-CM

## 2017-11-15 DIAGNOSIS — G8929 Other chronic pain: Secondary | ICD-10-CM

## 2017-11-15 DIAGNOSIS — M5442 Lumbago with sciatica, left side: Secondary | ICD-10-CM

## 2017-11-15 NOTE — Therapy (Signed)
[facility] [place] [address]  [address] [place], , [address] Phone: [phone]   Fax:  [phone]  Physical Therapy Treatment  Patient Details  Name: [name] MRN: [id number] Date of Birth: 03/14/1951 Referring Provider: Phillip McGowen, MD   Encounter Date: 11/15/2017  PT End of Session - 11/15/17 1537    Visit Number  9    Number of Visits  17    Date for PT Re-Evaluation  12/04/17    Authorization Type  Medicare    PT Start Time  1528    PT Stop Time  1616    PT Time Calculation (min)  48 min    Activity Tolerance  Patient tolerated treatment well    Behavior During Therapy  WFL for tasks assessed/performed       Past Medical History:  Diagnosis Date  . Allergic rhinitis    Allergy testing: results pending as of 05/28/16 (Dr. Van Winkle)  . Anemia   . Arthritis   . Cellulitis 5/12   Left (Since replacemnt of Left knee  . Chronic renal insufficiency, stage II (mild) 2015  . Complex endometrial hyperplasia with atypia 12/12   Most recent endometrial bx was 10/2012-NEG  . Cough variant asthma   . DDD (degenerative disc disease), lumbar   . Diabetes mellitus ([facility])   . Encounter for insertion of mirena IUD 5/12  . Fracture, foot 8/14   Hx of left foot stress fracture  . GERD (gastroesophageal reflux disease)   . Hemorrhoids   . Herpes zoster 10/2015   L side belt-line  . History of adenomatous polyp of colon 02/25/13   5 mm cecal polyp removed by Dr. Mann-recall 5 yrs  . History of blood transfusion   . Hyperlipidemia    Not on statin b/c lipids "stayed down when sugars came down" per pt.  She says Dr. Balan knows she is not on statin anymore.  . Hypertension   . Hypothyroidism   . Nephrolithiasis 4/07  . Obesity   . OSA on CPAP   . Osteoarthritis of both knees    Bilat TKA  . Osteoarthritis of right ankle and foot   . Osteoarthritis of right wrist    + ? scapholunate ligament disruption (x-ray  06/2017)--ortho referral.  . Sciatica of left side   . Severe persistent asthma    cough-variant--saw Allergist 05/25/16 and was switched from max dose advair to symbicort.  . Systolic murmur    ECHO fine 10/2016-->murmur likely flow murmur assoc with HTN.  . UTI (lower urinary tract infection)   . Zoon's vulvitis    Bx-proven (GYN) -lichen sclerosis.  Clobetasol 0.05% ointment per GYN    Past Surgical History:  Procedure Laterality Date  . ANKLE FRACTURE SURGERY  1984   Pin & repair  . ARTHROSCOPIC REPAIR ACL  2000   Due to ACL tear  . ARTHROSCOPIC REPAIR ACL  5/08  . Blateral knee rerlacements x4 2 times each knee    . BREAST BIOPSY Right 2014   Benign  . CARDIAC CATHETERIZATION  5/07   clear vessel mild mitral   . CARPAL TUNNEL RELEASE Right 1984;1989   1989 Left  . CHOLECYSTECTOMY  1988  . COLONOSCOPY N/A 02/25/2013   Tubular adenoma x 1: Recall 5 yrs. Procedure: COLONOSCOPY;  Surgeon: Jyothi Mann, MD;  Location: [facility];  Service: Endoscopy;  Laterality: N/A;  . COMBINED HYSTEROSCOPY DIAGNOSTIC / D&C  2/12   Bx neg  .   DEXA  08/2007   Bone density normal.  . DILATION AND CURETTAGE OF UTERUS  12/11  . REPLACEMENT TOTAL KNEE Left 5/12   X 2 on each  . TRANSTHORACIC ECHOCARDIOGRAM  10/11/2016    EF 55-60%, grd I DD.  . TUBAL LIGATION  1985   C-Section    There were no vitals filed for this visit.  Subjective Assessment - 11/15/17 1619    Subjective  Pt. noting E-stim utilized last visit improved her tolerance for standing activities in last session.      Patient Stated Goals  to decrease pain    Currently in Pain?  Yes    Pain Score  8     Pain Location  Hip    Pain Orientation  Right;Posterior    Pain Descriptors / Indicators  Aching    Pain Type  Chronic pain    Pain Radiating Towards  Endending into back while standing     Pain Onset  More than a month ago    Pain Frequency  Intermittent    Aggravating Factors   prolonged static standing     Pain  Relieving Factors  sitting     Multiple Pain Sites  No                       [facility] Adult PT Treatment/Exercise - 11/15/17 1544      Lumbar Exercises: Seated   Sit to Stand  10 reps from low mat table without UE pushoff      Knee/Hip Exercises: Aerobic   Nustep  L4 x 6 min      Knee/Hip Exercises: Standing   Hip ADduction  Right;Left;Strengthening;10 reps    Hip ADduction Limitations  chair support on one side with red TB     Hip Extension  Right;Left;Knee straight;Stengthening    Extension Limitations  45 dg kickback with red looped TB     Forward Step Up  Both;Step Height: 4";15 reps;Hand Hold: 1    Forward Step Up Limitations  1 chair UE support    Other Standing Knee Exercises  Side stepping with red TB at ankles at mat table 4 x 10 ft       Moist Heat Therapy   Number Minutes Moist Heat  10 Minutes    Moist Heat Location  Lumbar Spine      Electrical Stimulation   Electrical Stimulation Location  Lumbar spine    Electrical Stimulation Action  IFC    Electrical Stimulation Parameters  to tolerance, length of session 38'    Electrical Stimulation Goals  Pain;Neuromuscular facilitation             PT Education - 11/15/17 1812    Education Details  Provided handout for online TENS unit pending hearing back from insurance regarding [employer] TENS unit     Person(s) Educated  Patient    Methods  Explanation;Verbal cues;Handout    Comprehension  Verbalized understanding;Verbal cues required;Need further instruction       PT Short Term Goals - 11/13/17 1845      PT SHORT TERM GOAL #1   Title  Pt will be independent with HEP     Status  Achieved      PT SHORT TERM GOAL #2   Title  Pt will report pain that is no greater than 7/10 at worst    Status  On-going rises to 10/10      PT SHORT TERM GOAL #3  Title  Pt will report ability to walk to the mailbox and back to her house without increased pain    Status  On-going        PT Long Term Goals -  11/01/17 1822      PT LONG TERM GOAL #1   Title  Pt will be able to walk 300 ft without increased pain to improve ability to walk around a store    Status  On-going      PT LONG TERM GOAL #2   Title  Pt will have global LE strength of 4/5 to improve tolerance to activities    Status  Partially Met      PT LONG TERM GOAL #3   Title  Pt will be able to stand for >/= 15 min without increased pain to improve tolerance to activities of daily living such as cooking    Status  On-going      PT LONG TERM GOAL #4   Title  Pt will report pain at worst no greater than 5/10    Status  On-going            Plan - 11/15/17 1549    Clinical Impression Statement  [name] reporting the use of electrical stimulation last visit improved her tolerance for standing activities in session and wishes to use this again today.  Session focusing on standing lumbopelvic strenthening activities to pt. tolerance with application of E-stim for improved tolerance.  Pt. noting ~ 3/10 LBP today with E-stim and able to tolerated mild progression of standing activities.  Muscle atrophy and weakness of spinal extensors evident today as pt. with much difficulty maintaining upright posture with standing activities.  Ended session with E-stim. for further pain relief to lumbar spine.  Will continue to progress toward goals.      PT Treatment/Interventions  ADLs/Self Care Home Management;Cryotherapy;Electrical Stimulation;Iontophoresis 4mg/ml Dexamethasone;Moist Heat;Ultrasound;Gait training;Stair training;Functional mobility training;Therapeutic activities;Therapeutic exercise;DME Instruction;Patient/family education;Manual techniques;Taping;Dry needling    Consulted and Agree with Plan of Care  Patient       Patient will benefit from skilled therapeutic intervention in order to improve the following deficits and impairments:  Abnormal gait, Decreased endurance, Decreased activity tolerance, Decreased strength, Pain,  Difficulty walking, Decreased mobility, Increased muscle spasms, Improper body mechanics  Visit Diagnosis: Muscle weakness (generalized)  Difficulty in walking, not elsewhere classified  Chronic left-sided low back pain with left-sided sciatica     Problem List Patient Active Problem List   Diagnosis Date Noted  . Chronic pain 11/03/2017  . Scapholunate ligament injury, no instability 06/29/2017  . HTN (hypertension) 09/27/2016  . Dyspnea 09/27/2016  . Shingles outbreak 10/20/2015  . 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  . Hypothyroidism 07/10/2015  . OSA (obstructive sleep apnea) 07/10/2015  . Endometrial hyperplasia 07/02/2015  . Bronchitis 06/01/2015  . Cough variant asthma 11/02/2014  . Diabetes mellitus ([facility])   . Obesity     Micah Denny, PTA 11/15/17 6:16 PM     [facility] [place] [address]  [address] [place], [place], [address] Phone: [phone]   Fax:  [phone]  Name: [name] MRN: [id number] Date of Birth: 08/22/1950   

## 2017-11-15 NOTE — Patient Instructions (Addendum)
TENS stands for Transcutaneous Electrical Nerve Stimulation. In other words, electrical impulses are allowed to pass through the skin in order to excite a nerve.   Purpose and Use of TENS:  TENS is a method used to manage acute and chronic pain without the use of drugs. It has been effective in managing pain associated with surgery, sprains, strains, trauma, rheumatoid arthritis, and neuralgias. It is a non-addictive, low risk, and non-invasive technique used to control pain. It is not, by any means, a curative form of treatment.   How TENS Works:  Most TENS units are a small pocket-sized unit powered by one 9 volt battery. Attached to the outside of the unit are two lead wires where two pins and/or snaps connect on each wire. All units come with a set of four reusable pads or electrodes. These are placed on the skin surrounding the area involved. By inserting the leads into  the pads, the electricity can pass from the unit making the circuit complete.  As the intensity is turned up slowly, the electrical current enters the body from the electrodes through the skin to the surrounding nerve fibers. This triggers the release of hormones from within the body. These hormones contain pain relievers. By increasing the circulation of these hormones, the person's pain may be lessened. It is also believed that the electrical stimulation itself helps to block the pain messages being sent to the brain, thus also decreasing the body's perception of pain.   Hazards:  TENS units are NOT to be used by patients with PACEMAKERS, DEFIBRILLATORS, DIABETIC PUMPS, PREGNANT WOMEN, and patients with SEIZURE DISORDERS.  TENS units are NOT to be used over the heart, throat, brain, or spinal cord.  One of the major side effects from the TENS unit may be skin irritation. Some people may develop a rash if they are sensitive to the materials used in the electrodes or the connecting wires.   Wear the unit for 15'.   Avoid overuse  due the body getting used to the stem making it not as effective over time.   TENS UNIT  This is helpful for muscle pain and spasm.   Search and Purchase a TENS 7000 2nd edition at www.tenspros.com or www.amazon.com  (It should be less than $30)     TENS unit instructions:   Do not shower or bathe with the unit on  Turn the unit off before removing electrodes or batteries  If the electrodes lose stickiness add a drop of water to the electrodes after they are disconnected from the unit and place on plastic sheet. If you continued to have difficulty, call the TENS unit company to purchase more electrodes.  Do not apply lotion on the skin area prior to use. Make sure the skin is clean and dry as this will help prolong the life of the electrodes.  After use, always check skin for unusual red areas, rash or other skin difficulties. If there are any skin problems, does not apply electrodes to the same area.  Never remove the electrodes from the unit by pulling the wires.  Do not use the TENS unit or electrodes other than as directed.  Do not change electrode placement without consulting your therapist or physician.  Keep 2 fingers with between each electrode.  

## 2017-11-20 ENCOUNTER — Ambulatory Visit: Payer: Medicare Other

## 2017-11-20 DIAGNOSIS — M6281 Muscle weakness (generalized): Secondary | ICD-10-CM | POA: Diagnosis not present

## 2017-11-20 DIAGNOSIS — R262 Difficulty in walking, not elsewhere classified: Secondary | ICD-10-CM

## 2017-11-20 DIAGNOSIS — G8929 Other chronic pain: Secondary | ICD-10-CM

## 2017-11-20 DIAGNOSIS — M5442 Lumbago with sciatica, left side: Secondary | ICD-10-CM

## 2017-11-20 NOTE — Therapy (Signed)
[facility] [address]  [place], [place], [address] Phone: [phone]   Fax:  [phone]   Progress Note Reporting Period: 10/09/2017 to 11/20/2017  See note below for Objective Data and Assessment of Progress/Goals.       Physical Therapy Treatment  Patient Details  Name: [name] MRN: [id number] Date of Birth: 28-Jan-1951 Referring Provider: Ricardo Jericho, MD   Encounter Date: 11/20/2017  PT End of Session - 11/20/17 1535    Visit Number  10    Number of Visits  17    Date for PT Re-Evaluation  12/04/17    Authorization Type  Medicare    PT Start Time  1530    PT Stop Time  1630    PT Time Calculation (min)  60 min    Activity Tolerance  Patient tolerated treatment well    Behavior During Therapy  [facility] for tasks assessed/performed       Past Medical History:  Diagnosis Date  . Allergic rhinitis    Allergy testing: results pending as of 05/28/16 (Dr. Harold Hedge)  . Anemia   . Arthritis   . Cellulitis 5/12   Left (Since replacemnt of Left knee  . Chronic renal insufficiency, stage II (mild) 2015  . Complex endometrial hyperplasia with atypia 12/12   Most recent endometrial bx was 10/2012-NEG  . Cough variant asthma   . DDD (degenerative disc disease), lumbar   . Diabetes mellitus ([place])   . Encounter for insertion of mirena IUD 5/12  . Fracture, foot 8/14   Hx of left foot stress fracture  . GERD (gastroesophageal reflux disease)   . Hemorrhoids   . Herpes zoster 10/2015   L side belt-line  . History of adenomatous polyp of colon 02/25/13   5 mm cecal polyp removed by Dr. Trevor Mace 5 yrs  . History of blood transfusion   . Hyperlipidemia    Not on statin b/c lipids "stayed down when sugars came down" per pt.  She says Dr. Chalmers Cater knows she is not on statin anymore.  . Hypertension   . Hypothyroidism   . Nephrolithiasis 4/07  . Obesity   . OSA on CPAP   . Osteoarthritis of both  knees    Bilat TKA  . Osteoarthritis of right ankle and foot   . Osteoarthritis of right wrist    + ? scapholunate ligament disruption (x-ray 06/2017)--ortho referral.  . Sciatica of left side   . Severe persistent asthma    cough-variant--saw Allergist 05/25/16 and was switched from max dose advair to symbicort.  . Systolic murmur    ECHO fine 10/2016-->murmur likely flow murmur assoc with HTN.  Marland Kitchen UTI (lower urinary tract infection)   . Zoon's vulvitis    Bx-proven (GYN) -lichen sclerosis.  Clobetasol 0.05% ointment per GYN    Past Surgical History:  Procedure Laterality Date  . ANKLE FRACTURE SURGERY  1984   Pin & repair  . ARTHROSCOPIC REPAIR ACL  2000   Due to ACL tear  . ARTHROSCOPIC REPAIR ACL  5/08  . Blateral knee rerlacements x4 2 times each knee    . BREAST BIOPSY Right 2014   Benign  . CARDIAC CATHETERIZATION  5/07   clear vessel mild mitral   . CARPAL TUNNEL RELEASE Right [id number]   1989 Left  . CHOLECYSTECTOMY  1988  . COLONOSCOPY N/A 02/25/2013   Tubular adenoma x 1: Recall 5 yrs. Procedure: COLONOSCOPY;  Surgeon: Juanita Craver,  MD;  Location: [facility];  Service: Endoscopy;  Laterality: N/A;  . COMBINED HYSTEROSCOPY DIAGNOSTIC / D&C  2/12   Bx neg  . DEXA  08/2007   Bone density normal.  . DILATION AND CURETTAGE OF UTERUS  12/11  . REPLACEMENT TOTAL KNEE Left 5/12   X 2 on each  . TRANSTHORACIC ECHOCARDIOGRAM  10/11/2016    EF 55-60%, grd I DD.  . TUBAL LIGATION  1985   C-Section    There were no vitals filed for this visit.  Subjective Assessment - 11/20/17 1532    Subjective  Pt. noting she feels she can stand longer before onset of intense pain.      How long can you sit comfortably?  unlimited     How long can you stand comfortably?  3-4 min     How long can you walk comfortably?  150 ft     Patient Stated Goals  to decrease pain    Currently in Pain?  Yes    Pain Score  --   Pt. noting LBP up to 10/10 with climbing stairs   Pain Location  Hip     Pain Orientation  Right    Pain Descriptors / Indicators  Aching    Pain Type  Chronic pain    Pain Radiating Towards  into back while standing    Pain Onset  More than a month ago    Aggravating Factors   prolonged standing    Multiple Pain Sites  Yes    Pain Score  6    Pain Location  Knee    Pain Orientation  Right;Left    Pain Descriptors / Indicators  Aching    Pain Type  Chronic pain    Pain Onset  More than a month ago    Pain Frequency  Constant    Aggravating Factors   Unsure     Pain Relieving Factors  Unsure          [facility] PT Assessment - 11/20/17 1550      Assessment   Medical Diagnosis  LBP with L sided sciatica    Referring Provider  Ricardo Jericho, MD    Next MD Visit  9.25.19      Observation/Other Assessments   Focus on Therapeutic Outcomes (FOTO)   41% (59% limitation)      Strength   Right/Left Hip  Right;Left    Right Hip Flexion  4/5    Right Hip Extension  4/5    Right Hip External Rotation   4/5    Right Hip Internal Rotation  4+/5    Right Hip ABduction  4/5    Right Hip ADduction  4/5    Left Hip Flexion  4+/5    Left Hip Extension  4/5    Left Hip External Rotation  4/5    Left Hip Internal Rotation  4+/5    Left Hip ABduction  4+/5    Left Hip ADduction  4/5    Right/Left Knee  Right;Left    Right Knee Flexion  4+/5    Right Knee Extension  4+/5    Left Knee Flexion  4+/5    Left Knee Extension  5/5                   [facility] Adult PT Treatment/Exercise - 11/20/17 1549      Ambulation/Gait   Ambulation/Gait  Yes    Ambulation/Gait Assistance  6: Modified independent (Device/Increase time)  Ambulation Distance (Feet)  300 Feet   1 sitting rest break ~ 20 sec due to LBP   Assistive device  Straight cane    Gait Pattern  Decreased arm swing - right;Decreased step length - right;Decreased arm swing - left;Decreased step length - left;Decreased stride length;Decreased hip/knee flexion - right;Decreased hip/knee flexion -  left;Lateral trunk lean to right;Lateral trunk lean to left    Ambulation Surface  Level;Indoor    Gait Comments  Pt. noting she can walk a greater distance with SPC before onset of increased LBP however noted increased at ~ 150 ft mark and requesting sitting rest break which quickly relieved pain       Knee/Hip Exercises: Aerobic   Nustep  L4 x 6 min      Knee/Hip Exercises: Standing   Hip Abduction  Right;Left;Knee straight;Stengthening;15 reps   sitting rest break following activity due to LBP   Abduction Limitations  red looped TB at ankle; at chair     Hip Extension  Right;Left;Knee straight;Stengthening;15 reps   sitting rest break following activity due to LBP   Extension Limitations  45 dg kickback with red looped TB       Moist Heat Therapy   Number Minutes Moist Heat  15 Minutes    Moist Heat Location  Lumbar Spine      Electrical Stimulation   Electrical Stimulation Location  Lumbar spine    Electrical Stimulation Action  Premod     Electrical Stimulation Parameters  to tolerance, 15'     Electrical Stimulation Goals  Pain               PT Short Term Goals - 11/20/17 1538      PT SHORT TERM GOAL #1   Title  Pt will be independent with HEP     Status  Achieved      PT SHORT TERM GOAL #2   Title  Pt will report pain that is no greater than 7/10 at worst    Status  On-going   rises to 10/10     PT SHORT TERM GOAL #3   Title  Pt will report ability to walk to the mailbox and back to her house without increased pain    Status  On-going   Notes some improvement in distance she can walk without increased pain however still noting significant pain increase with this activity.         PT Long Term Goals - 11/20/17 1559      PT LONG TERM GOAL #1   Title  Pt will be able to walk 300 ft without increased pain to improve ability to walk around a store    Status  On-going   Pt. still noting signficant increased LBP with 300 ft ambulation with SPC     PT LONG  TERM GOAL #2   Title  Pt will have global LE strength of 4/5 to improve tolerance to activities    Status  Achieved      PT LONG TERM GOAL #3   Title  Pt will be able to stand for >/= 15 min without increased pain to improve tolerance to activities of daily living such as cooking    Status  On-going   3-4 min      PT LONG TERM GOAL #4   Title  Pt will report pain at worst no greater than 5/10    Status  On-going  Plan - 11/20/17 1549    Clinical Impression Statement  Homer has made progress with therapy.  Notes improved tolerance for standing and walking with increased duration before increase in LBP now.  Still limited by LBP with standing activities requiring frequent rest breaks for relief.  Pt. has made excellent progress with LE strength with MMT achieving this goal today.  Most limited in walking and standing related goals at this point.  Notes she is having increased LBP navigating stairs at home and wishes to incorporate this into POC.  Wishes to continue with therapy and feels a 40% improvement in overall pain levels since starting therapy.  Continues with improved tolerance for standing strengthening activities with addition of E-stim. which has been utilized over past three visits to modulate pain levels successfully with improved functional strengthening.  Still no answer back from insurance regarding coverage for home TENS unit and will continue to f/u in future visits regarding this.  Pt. will continue to benefit from further skilled therapy to maximize functional strength and quality of life.      Clinical Impairments Affecting Rehab Potential  Chronicity of condition, complex medical history    PT Treatment/Interventions  ADLs/Self Care Home Management;Cryotherapy;Electrical Stimulation;Iontophoresis 4mg /ml Dexamethasone;Moist Heat;Ultrasound;Gait training;Stair training;Functional mobility training;Therapeutic activities;Therapeutic exercise;DME  Instruction;Patient/family education;Manual techniques;Taping;Dry needling    Consulted and Agree with Plan of Care  Patient       Patient will benefit from skilled therapeutic intervention in order to improve the following deficits and impairments:  Abnormal gait, Decreased endurance, Decreased activity tolerance, Decreased strength, Pain, Difficulty walking, Decreased mobility, Increased muscle spasms, Improper body mechanics  Visit Diagnosis: Muscle weakness (generalized)  Difficulty in walking, not elsewhere classified  Chronic left-sided low back pain with left-sided sciatica     Problem List Patient Active Problem List   Diagnosis Date Noted  . Chronic pain 11/03/2017  . Scapholunate ligament injury, no instability 06/29/2017  . HTN (hypertension) 09/27/2016  . Dyspnea 09/27/2016  . Shingles outbreak 10/20/2015  . 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  . Hypothyroidism 07/10/2015  . OSA (obstructive sleep apnea) 07/10/2015  . Endometrial hyperplasia 07/02/2015  . Bronchitis 06/01/2015  . Cough variant asthma 11/02/2014  . Diabetes mellitus ([place])   . Obesity     Bess Harvest, [place] 11/20/17 6:06 PM    [facility] [address]  [place], [place], [address] Phone: [phone]   Fax:  [phone]  Name: [name] MRN: [id number] Date of Birth: [birth year]    Pt is noting good progress through this episode of therapy, and is more willing to participate in activities that involve walking but that improve quality of life such as going to the movies with her family. With the incorporation of estim during sessions, pt has been able to more readily participate in strengthening and other functional activities which have helped to progress her functional ability. She will continue to benefit from the remaining 7 visits in the originally established POC to continue to improve her functional mobility and progress toward her  functional goals, including incorporation of stair training activities into therapy sessions.   Shirline Frees, SPT  11/20/2017 7:05 PM  Percival Spanish, PT, MPT 11/20/17, 7:05 PM  [facility] [address]  [address] [place], [place], [address] Phone: [phone]   Fax:  [phone]

## 2017-11-22 ENCOUNTER — Ambulatory Visit: Payer: Medicare Other

## 2017-11-22 DIAGNOSIS — R262 Difficulty in walking, not elsewhere classified: Secondary | ICD-10-CM

## 2017-11-22 DIAGNOSIS — G8929 Other chronic pain: Secondary | ICD-10-CM

## 2017-11-22 DIAGNOSIS — M6281 Muscle weakness (generalized): Secondary | ICD-10-CM

## 2017-11-22 DIAGNOSIS — M5442 Lumbago with sciatica, left side: Secondary | ICD-10-CM

## 2017-11-22 NOTE — Therapy (Signed)
[facility] [address]  [place], [place], [address] Phone: [phone]   Fax:  [phone]  Physical Therapy Treatment  Patient Details  Name: [name] MRN: [id number] Date of Birth: 1950-11-23 Referring Provider: Ricardo Jericho, MD   Encounter Date: 11/22/2017  PT End of Session - 11/22/17 1549    Visit Number  11    Number of Visits  17    Date for PT Re-Evaluation  12/04/17    Authorization Type  Medicare    PT Start Time  1530    PT Stop Time  1630    PT Time Calculation (min)  60 min    Activity Tolerance  Patient tolerated treatment well    Behavior During Therapy  [facility] for tasks assessed/performed       Past Medical History:  Diagnosis Date  . Allergic rhinitis    Allergy testing: results pending as of 05/28/16 (Dr. Harold Hedge)  . Anemia   . Arthritis   . Cellulitis 5/12   Left (Since replacemnt of Left knee  . Chronic renal insufficiency, stage II (mild) 2015  . Complex endometrial hyperplasia with atypia 12/12   Most recent endometrial bx was 10/2012-NEG  . Cough variant asthma   . DDD (degenerative disc disease), lumbar   . Diabetes mellitus ([place])   . Encounter for insertion of mirena IUD 5/12  . Fracture, foot 8/14   Hx of left foot stress fracture  . GERD (gastroesophageal reflux disease)   . Hemorrhoids   . Herpes zoster 10/2015   L side belt-line  . History of adenomatous polyp of colon 02/25/13   5 mm cecal polyp removed by Dr. Trevor Mace 5 yrs  . History of blood transfusion   . Hyperlipidemia    Not on statin b/c lipids "stayed down when sugars came down" per pt.  She says Dr. Chalmers Cater knows she is not on statin anymore.  . Hypertension   . Hypothyroidism   . Nephrolithiasis 4/07  . Obesity   . OSA on CPAP   . Osteoarthritis of both knees    Bilat TKA  . Osteoarthritis of right ankle and foot   . Osteoarthritis of right wrist    + ? scapholunate ligament disruption (x-ray  06/2017)--ortho referral.  . Sciatica of left side   . Severe persistent asthma    cough-variant--saw Allergist 05/25/16 and was switched from max dose advair to symbicort.  . Systolic murmur    ECHO fine 10/2016-->murmur likely flow murmur assoc with HTN.  Marland Kitchen UTI (lower urinary tract infection)   . Zoon's vulvitis    Bx-proven (GYN) -lichen sclerosis.  Clobetasol 0.05% ointment per GYN    Past Surgical History:  Procedure Laterality Date  . ANKLE FRACTURE SURGERY  1984   Pin & repair  . ARTHROSCOPIC REPAIR ACL  2000   Due to ACL tear  . ARTHROSCOPIC REPAIR ACL  5/08  . Blateral knee rerlacements x4 2 times each knee    . BREAST BIOPSY Right 2014   Benign  . CARDIAC CATHETERIZATION  5/07   clear vessel mild mitral   . CARPAL TUNNEL RELEASE Right [id number]   1989 Left  . CHOLECYSTECTOMY  1988  . COLONOSCOPY N/A 02/25/2013   Tubular adenoma x 1: Recall 5 yrs. Procedure: COLONOSCOPY;  Surgeon: Juanita Craver, MD;  Location: [facility];  Service: Endoscopy;  Laterality: N/A;  . COMBINED HYSTEROSCOPY DIAGNOSTIC / D&C  2/12   Bx neg  .  DEXA  08/2007   Bone density normal.  . DILATION AND CURETTAGE OF UTERUS  12/11  . REPLACEMENT TOTAL KNEE Left 5/12   X 2 on each  . TRANSTHORACIC ECHOCARDIOGRAM  10/11/2016    EF 55-60%, grd I DD.  . TUBAL LIGATION  1985   C-Section    There were no vitals filed for this visit.  Subjective Assessment - 11/22/17 1531    Subjective  Notes she did not feel soreness after last visit only "tired" LE's.      Patient Stated Goals  to decrease pain    Currently in Pain?  Yes    Pain Score  8    9.25/10 pain while walking into therapy building today.     Pain Location  Hip    Pain Orientation  Right    Pain Descriptors / Indicators  Aching    Pain Type  Chronic pain    Pain Onset  More than a month ago    Pain Frequency  Intermittent    Aggravating Factors   Prolonged standing or walking     Pain Relieving Factors  sitting     Multiple Pain Sites   No                       [facility] Adult PT Treatment/Exercise - 11/22/17 1609      Lumbar Exercises: Standing   Row  Both;Strengthening;15 reps    Theraband Level (Row)  Level 3 (Green)      Lumbar Exercises: Seated   Hip Flexion on Ball  Right;Left;10 reps    Hip Flexion on Ball Limitations  seated on green p-ball     Sit to Stand  10 reps    Sit to Stand Limitations  seated on green p-ball    no UE support and slow eccentric lowering      Knee/Hip Exercises: Aerobic   Nustep  L5 x 7 min      Knee/Hip Exercises: Standing   Forward Step Up  Right;Left;Hand Hold: 1;Step Height: 6";10 reps    Forward Step Up Limitations  1 chair UE support      Knee/Hip Exercises: Seated   Long Arc Quad  Right;Left;Strengthening;Weights   x 12 reps    Long Arc Quad Limitations  red looped at ankles; 3" hold     Hamstring Curl  Right;Left;Strengthening;15 reps    Hamstring Limitations  red looped TB       Moist Heat Therapy   Number Minutes Moist Heat  15 Minutes    Moist Heat Location  Lumbar Spine      Electrical Stimulation   Electrical Stimulation Location  Lumbar spine    Electrical Stimulation Action  IFC   IFC as pt. noting no difference with pain relief from Premod   Electrical Stimulation Parameters  to tolerance, 15'     Electrical Stimulation Goals  Pain               PT Short Term Goals - 11/20/17 1538      PT SHORT TERM GOAL #1   Title  Pt will be independent with HEP     Status  Achieved      PT SHORT TERM GOAL #2   Title  Pt will report pain that is no greater than 7/10 at worst    Status  On-going   rises to 10/10     PT SHORT TERM GOAL #3   Title  Pt  will report ability to walk to the mailbox and back to her house without increased pain    Status  On-going   Notes some improvement in distance she can walk without increased pain however still noting significant pain increase with this activity.         PT Long Term Goals - 11/20/17 1559       PT LONG TERM GOAL #1   Title  Pt will be able to walk 300 ft without increased pain to improve ability to walk around a store    Status  On-going   Pt. still noting signficant increased LBP with 300 ft ambulation with SPC     PT LONG TERM GOAL #2   Title  Pt will have global LE strength of 4/5 to improve tolerance to activities    Status  Achieved      PT LONG TERM GOAL #3   Title  Pt will be able to stand for >/= 15 min without increased pain to improve tolerance to activities of daily living such as cooking    Status  On-going   3-4 min      PT LONG TERM GOAL #4   Title  Pt will report pain at worst no greater than 5/10    Status  On-going            Plan - 11/22/17 1752    Clinical Impression Statement  [name] reporting fatigued LE's following last visit however no increased LBP.  Tolerated progression of 6" step-ups and progression of p-ball lumbopelvic stability activities well today with LBP well controlled with IFC E-stim applied throughout standing therex.  Pt. ended visit with E-stim/moist heat to lumbar spine as she notes improved pain levels for remainder of day from this.  Will continue to progress toward goals.      Clinical Impairments Affecting Rehab Potential  Chronicity of condition, complex medical history    PT Treatment/Interventions  ADLs/Self Care Home Management;Cryotherapy;Electrical Stimulation;Iontophoresis 4mg /ml Dexamethasone;Moist Heat;Ultrasound;Gait training;Stair training;Functional mobility training;Therapeutic activities;Therapeutic exercise;DME Instruction;Patient/family education;Manual techniques;Taping;Dry needling    Consulted and Agree with Plan of Care  Patient       Patient will benefit from skilled therapeutic intervention in order to improve the following deficits and impairments:  Abnormal gait, Decreased endurance, Decreased activity tolerance, Decreased strength, Pain, Difficulty walking, Decreased mobility, Increased muscle spasms,  Improper body mechanics  Visit Diagnosis: Muscle weakness (generalized)  Difficulty in walking, not elsewhere classified  Chronic left-sided low back pain with left-sided sciatica     Problem List Patient Active Problem List   Diagnosis Date Noted  . Chronic pain 11/03/2017  . Scapholunate ligament injury, no instability 06/29/2017  . HTN (hypertension) 09/27/2016  . Dyspnea 09/27/2016  . Shingles outbreak 10/20/2015  . 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  . Hypothyroidism 07/10/2015  . OSA (obstructive sleep apnea) 07/10/2015  . Endometrial hyperplasia 07/02/2015  . Bronchitis 06/01/2015  . Cough variant asthma 11/02/2014  . Diabetes mellitus ([place])   . Obesity     Bess Harvest, [place] 11/22/17 6:02 PM   [facility] [address]  [place], [place], [address] Phone: [phone]   Fax:  [phone]  Name: SHERILYNN DIEU MRN: [id number] Date of Birth: 10-06-1950

## 2017-11-23 ENCOUNTER — Other Ambulatory Visit: Payer: Self-pay | Admitting: Obstetrics & Gynecology

## 2017-11-23 DIAGNOSIS — Z1231 Encounter for screening mammogram for malignant neoplasm of breast: Secondary | ICD-10-CM

## 2017-11-27 ENCOUNTER — Ambulatory Visit: Payer: Medicare Other

## 2017-11-27 ENCOUNTER — Other Ambulatory Visit: Payer: Self-pay | Admitting: Family Medicine

## 2017-11-27 DIAGNOSIS — M6281 Muscle weakness (generalized): Secondary | ICD-10-CM | POA: Diagnosis not present

## 2017-11-27 DIAGNOSIS — G8929 Other chronic pain: Secondary | ICD-10-CM

## 2017-11-27 DIAGNOSIS — R262 Difficulty in walking, not elsewhere classified: Secondary | ICD-10-CM | POA: Diagnosis not present

## 2017-11-27 DIAGNOSIS — M5442 Lumbago with sciatica, left side: Secondary | ICD-10-CM

## 2017-11-27 NOTE — Patient Instructions (Signed)

## 2017-11-27 NOTE — Therapy (Signed)
[facility] [address]  [place], [place], [address] Phone: [phone]   Fax:  [phone]  Physical Therapy Treatment  Patient Details  Name: [name] MRN: [id number] Date of Birth: [birth year] Referring Provider: Ricardo Jericho, MD   Encounter Date: 11/27/2017  PT End of Session - 11/27/17 1542    Visit Number  12    Number of Visits  17    Date for PT Re-Evaluation  12/04/17    Authorization Type  Medicare    PT Start Time  1537    PT Stop Time  1630    PT Time Calculation (min)  53 min    Activity Tolerance  Patient tolerated treatment well    Behavior During Therapy  [facility] for tasks assessed/performed       Past Medical History:  Diagnosis Date  . Allergic rhinitis    Allergy testing: results pending as of 05/28/16 (Dr. Harold Hedge)  . Anemia   . Arthritis   . Cellulitis 5/12   Left (Since replacemnt of Left knee  . Chronic renal insufficiency, stage II (mild) 2015  . Complex endometrial hyperplasia with atypia 12/12   Most recent endometrial bx was 10/2012-NEG  . Cough variant asthma   . DDD (degenerative disc disease), lumbar   . Diabetes mellitus ([place])   . Encounter for insertion of mirena IUD 5/12  . Fracture, foot 8/14   Hx of left foot stress fracture  . GERD (gastroesophageal reflux disease)   . Hemorrhoids   . Herpes zoster 10/2015   L side belt-line  . History of adenomatous polyp of colon 02/25/13   5 mm cecal polyp removed by Dr. Trevor Mace 5 yrs  . History of blood transfusion   . Hyperlipidemia    Not on statin b/c lipids "stayed down when sugars came down" per pt.  She says Dr. Chalmers Cater knows she is not on statin anymore.  . Hypertension   . Hypothyroidism   . Nephrolithiasis 4/07  . Obesity   . OSA on CPAP   . Osteoarthritis of both knees    Bilat TKA  . Osteoarthritis of right ankle and foot   . Osteoarthritis of right wrist    + ? scapholunate ligament disruption (x-ray  06/2017)--ortho referral.  . Sciatica of left side   . Severe persistent asthma    cough-variant--saw Allergist 05/25/16 and was switched from max dose advair to symbicort.  . Systolic murmur    ECHO fine 10/2016-->murmur likely flow murmur assoc with HTN.  Marland Kitchen UTI (lower urinary tract infection)   . Zoon's vulvitis    Bx-proven (GYN) -lichen sclerosis.  Clobetasol 0.05% ointment per GYN    Past Surgical History:  Procedure Laterality Date  . ANKLE FRACTURE SURGERY  1984   Pin & repair  . ARTHROSCOPIC REPAIR ACL  2000   Due to ACL tear  . ARTHROSCOPIC REPAIR ACL  5/08  . Blateral knee rerlacements x4 2 times each knee    . BREAST BIOPSY Right 2014   Benign  . CARDIAC CATHETERIZATION  5/07   clear vessel mild mitral   . CARPAL TUNNEL RELEASE Right [id number];[id number]   1989 Left  . CHOLECYSTECTOMY  1988  . COLONOSCOPY N/A 02/25/2013   Tubular adenoma x 1: Recall 5 yrs. Procedure: COLONOSCOPY;  Surgeon: Juanita Craver, MD;  Location: [facility];  Service: Endoscopy;  Laterality: N/A;  . COMBINED HYSTEROSCOPY DIAGNOSTIC / D&C  2/12   Bx neg  .  DEXA  08/2007   Bone density normal.  . DILATION AND CURETTAGE OF UTERUS  12/11  . REPLACEMENT TOTAL KNEE Left 5/12   X 2 on each  . TRANSTHORACIC ECHOCARDIOGRAM  10/11/2016    EF 55-60%, grd I DD.  . TUBAL LIGATION  1985   C-Section    There were no vitals filed for this visit.  Subjective Assessment - 11/27/17 1544    Subjective  Pt. reporting increased pain today without known trigger.      Patient Stated Goals  to decrease pain    Currently in Pain?  Yes    Pain Score  5    up to 9.5/10 at worst    Pain Location  Hip    Pain Orientation  Left    Pain Descriptors / Indicators  Aching    Pain Type  Chronic pain    Multiple Pain Sites  Yes    Pain Score  5    Pain Location  Knee    Pain Orientation  Right    Pain Descriptors / Indicators  Aching    Pain Frequency  Constant    Aggravating Factors   "when I use it"                        [facility] Adult PT Treatment/Exercise - 11/27/17 1607      Self-Care   Self-Care  Other Self-Care Comments    Other Self-Care Comments   Discussed proper body mechancins and posture with daily activities for hopeful reduction in LBP pain       Lumbar Exercises: Machines for Strengthening   Other Lumbar Machine Exercise  Straight arm BATCA pulldown 10# + abdom. bracing x 10 reps       Lumbar Exercises: Standing   Other Standing Lumbar Exercises  Standing abdominal bracing 5" x 10 reps for carryover with steppping activities as to reduce lumbar strain       Knee/Hip Exercises: Aerobic   Nustep  L5 x 7 min      Knee/Hip Exercises: Standing   Forward Step Up  Right;Left;Hand Hold: 1;Step Height: 6"   x 12 reps    Forward Step Up Limitations  1 chair UE support      Moist Heat Therapy   Number Minutes Moist Heat  15 Minutes    Moist Heat Location  Lumbar Spine      Electrical Stimulation   Electrical Stimulation Location  Lumbar spine    Electrical Stimulation Action  IFC    Electrical Stimulation Parameters  to tolerance, 15', and 38' during session     Electrical Stimulation Goals  Pain             PT Education - 11/27/17 1824    Education Details  posture and body mechancis handout     Person(s) Educated  Patient    Methods  Explanation;Verbal cues;Handout    Comprehension  Verbalized understanding;Verbal cues required;Need further instruction       PT Short Term Goals - 11/20/17 1538      PT SHORT TERM GOAL #1   Title  Pt will be independent with HEP     Status  Achieved      PT SHORT TERM GOAL #2   Title  Pt will report pain that is no greater than 7/10 at worst    Status  On-going   rises to 10/10     PT SHORT TERM GOAL #3  Title  Pt will report ability to walk to the mailbox and back to her house without increased pain    Status  On-going   Notes some improvement in distance she can walk without increased pain however  still noting significant pain increase with this activity.         PT Long Term Goals - 11/20/17 1559      PT LONG TERM GOAL #1   Title  Pt will be able to walk 300 ft without increased pain to improve ability to walk around a store    Status  On-going   Pt. still noting signficant increased LBP with 300 ft ambulation with SPC     PT LONG TERM GOAL #2   Title  Pt will have global LE strength of 4/5 to improve tolerance to activities    Status  Achieved      PT LONG TERM GOAL #3   Title  Pt will be able to stand for >/= 15 min without increased pain to improve tolerance to activities of daily living such as cooking    Status  On-going   3-4 min      PT LONG TERM GOAL #4   Title  Pt will report pain at worst no greater than 5/10    Status  On-going            Plan - 11/27/17 1554    Clinical Impression Statement  [name] reporting increased LBP today without known trigger.  Notes ~ 30% ipmrovement in pain levels since starting therapy.  Still reporting much difficulty and 10/10 LBP at half-way mark walking to mail box.  Tolerated progression of lumbopelvic strengthenign activities today with addition of IFC E-stim well however still requiring frequent sitting rest breaks due to LBP with standing activities.  Discussed proper body mechanics with household tasks today with [name] as to reduce lumbar strain with handout issue to pt.  Will continue to progress toward goals.      Clinical Impairments Affecting Rehab Potential  Chronicity of condition, complex medical history    PT Treatment/Interventions  ADLs/Self Care Home Management;Cryotherapy;Electrical Stimulation;Iontophoresis 4mg /ml Dexamethasone;Moist Heat;Ultrasound;Gait training;Stair training;Functional mobility training;Therapeutic activities;Therapeutic exercise;DME Instruction;Patient/family education;Manual techniques;Taping;Dry needling    Consulted and Agree with Plan of Care  Patient       Patient will benefit from  skilled therapeutic intervention in order to improve the following deficits and impairments:  Abnormal gait, Decreased endurance, Decreased activity tolerance, Decreased strength, Pain, Difficulty walking, Decreased mobility, Increased muscle spasms, Improper body mechanics  Visit Diagnosis: Muscle weakness (generalized)  Difficulty in walking, not elsewhere classified  Chronic left-sided low back pain with left-sided sciatica     Problem List Patient Active Problem List   Diagnosis Date Noted  . Chronic pain 11/03/2017  . Scapholunate ligament injury, no instability 06/29/2017  . HTN (hypertension) 09/27/2016  . Dyspnea 09/27/2016  . Shingles outbreak 10/20/2015  . 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  . Hypothyroidism 07/10/2015  . OSA (obstructive sleep apnea) 07/10/2015  . Endometrial hyperplasia 07/02/2015  . Bronchitis 06/01/2015  . Cough variant asthma 11/02/2014  . Diabetes mellitus ([place])   . Obesity     Bess Harvest, [place] 11/27/17 6:25 PM   [facility] [address]  [place], [place], [address] Phone: [phone]   Fax:  [phone]  Name: [name] MRN: [id number] Date of Birth: Jul 29, 1950

## 2017-11-27 NOTE — Telephone Encounter (Signed)
RF request for meloxicam LOV: 11/03/17 Next ov: 01/03/18 Last written: 08/10/17 #30 w/ 3RF  Please advise. Thanks.

## 2017-11-29 ENCOUNTER — Ambulatory Visit (HOSPITAL_BASED_OUTPATIENT_CLINIC_OR_DEPARTMENT_OTHER)
Admission: RE | Admit: 2017-11-29 | Discharge: 2017-11-29 | Disposition: A | Discharge status: Home / Self Care | Payer: Medicare Other | Source: Ambulatory Visit | Attending: Obstetrics & Gynecology | Admitting: Obstetrics & Gynecology

## 2017-11-29 ENCOUNTER — Ambulatory Visit: Payer: Medicare Other

## 2017-11-29 DIAGNOSIS — R262 Difficulty in walking, not elsewhere classified: Secondary | ICD-10-CM | POA: Diagnosis not present

## 2017-11-29 DIAGNOSIS — M6281 Muscle weakness (generalized): Secondary | ICD-10-CM | POA: Diagnosis not present

## 2017-11-29 DIAGNOSIS — G8929 Other chronic pain: Secondary | ICD-10-CM | POA: Diagnosis not present

## 2017-11-29 DIAGNOSIS — Z1231 Encounter for screening mammogram for malignant neoplasm of breast: Secondary | ICD-10-CM | POA: Insufficient documentation

## 2017-11-29 DIAGNOSIS — M5442 Lumbago with sciatica, left side: Secondary | ICD-10-CM | POA: Diagnosis not present

## 2017-11-29 NOTE — Therapy (Signed)
[facility] [address]  [place], [place], [address] Phone: [phone]   Fax:  [phone]  Physical Therapy Treatment  Patient Details  Name: [name] MRN: [id number] Date of Birth: Jun 19, 1950 Referring Provider: Ricardo Jericho, MD   Encounter Date: 11/29/2017  PT End of Session - 11/29/17 1609    Visit Number  13    Number of Visits  17    Date for PT Re-Evaluation  12/04/17    Authorization Type  Medicare    PT Start Time  1529    PT Stop Time  1615    PT Time Calculation (min)  46 min    Activity Tolerance  Patient tolerated treatment well    Behavior During Therapy  [facility] for tasks assessed/performed       Past Medical History:  Diagnosis Date  . Allergic rhinitis    Allergy testing: results pending as of 05/28/16 (Dr. Harold Hedge)  . Anemia   . Arthritis   . Cellulitis 5/12   Left (Since replacemnt of Left knee  . Chronic renal insufficiency, stage II (mild) 2015  . Complex endometrial hyperplasia with atypia 12/12   Most recent endometrial bx was 10/2012-NEG  . Cough variant asthma   . DDD (degenerative disc disease), lumbar   . Diabetes mellitus ([place])   . Encounter for insertion of mirena IUD 5/12  . Fracture, foot 8/14   Hx of left foot stress fracture  . GERD (gastroesophageal reflux disease)   . Hemorrhoids   . Herpes zoster 10/2015   L side belt-line  . History of adenomatous polyp of colon 02/25/13   5 mm cecal polyp removed by Dr. Trevor Mace 5 yrs  . History of blood transfusion   . Hyperlipidemia    Not on statin b/c lipids "stayed down when sugars came down" per pt.  She says Dr. Chalmers Cater knows she is not on statin anymore.  . Hypertension   . Hypothyroidism   . Nephrolithiasis 4/07  . Obesity   . OSA on CPAP   . Osteoarthritis of both knees    Bilat TKA  . Osteoarthritis of right ankle and foot   . Osteoarthritis of right wrist    + ? scapholunate ligament disruption (x-ray  06/2017)--ortho referral.  . Sciatica of left side   . Severe persistent asthma    cough-variant--saw Allergist 05/25/16 and was switched from max dose advair to symbicort.  . Systolic murmur    ECHO fine 10/2016-->murmur likely flow murmur assoc with HTN.  Marland Kitchen UTI (lower urinary tract infection)   . Zoon's vulvitis    Bx-proven (GYN) -lichen sclerosis.  Clobetasol 0.05% ointment per GYN    Past Surgical History:  Procedure Laterality Date  . ANKLE FRACTURE SURGERY  1984   Pin & repair  . ARTHROSCOPIC REPAIR ACL  2000   Due to ACL tear  . ARTHROSCOPIC REPAIR ACL  5/08  . Blateral knee rerlacements x4 2 times each knee    . BREAST BIOPSY Right 2014   Benign  . CARDIAC CATHETERIZATION  5/07   clear vessel mild mitral   . CARPAL TUNNEL RELEASE Right [id number];[id number]   1989 Left  . CHOLECYSTECTOMY  1988  . COLONOSCOPY N/A 02/25/2013   Tubular adenoma x 1: Recall 5 yrs. Procedure: COLONOSCOPY;  Surgeon: Juanita Craver, MD;  Location: [facility];  Service: Endoscopy;  Laterality: N/A;  . COMBINED HYSTEROSCOPY DIAGNOSTIC / D&C  2/12   Bx neg  .  DEXA  08/2007   Bone density normal.  . DILATION AND CURETTAGE OF UTERUS  12/11  . REPLACEMENT TOTAL KNEE Left 5/12   X 2 on each  . TRANSTHORACIC ECHOCARDIOGRAM  10/11/2016    EF 55-60%, grd I DD.  . TUBAL LIGATION  1985   C-Section    There were no vitals filed for this visit.  Subjective Assessment - 11/29/17 1557    Subjective  Pt. reporting increased LBP after coming in from car into session.    Patient Stated Goals  to decrease pain    Currently in Pain?  Yes    Pain Score  9     Pain Location  Hip    Pain Orientation  Left    Pain Descriptors / Indicators  Aching    Pain Type  Chronic pain    Pain Radiating Towards  into back while standing     Pain Onset  More than a month ago    Pain Frequency  Intermittent    Aggravating Factors   Rises with prolonged standing and walking     Multiple Pain Sites  No    Pain Score  5    Pain  Location  Knee    Pain Orientation  Right    Pain Descriptors / Indicators  Aching    Pain Type  Chronic pain                       [facility] Adult PT Treatment/Exercise - 11/29/17 1621      Ambulation/Gait   Ambulation/Gait  Yes    Ambulation/Gait Assistance  6: Modified independent (Device/Increase time)    Ambulation Distance (Feet)  300 Feet    Assistive device  Straight cane    Gait Pattern  Decreased arm swing - right;Decreased step length - right;Decreased arm swing - left;Decreased step length - left;Decreased stride length;Decreased hip/knee flexion - right;Decreased hip/knee flexion - left;Lateral trunk lean to right;Lateral trunk lean to left    Ambulation Surface  Level;Indoor    Gait Comments  ambulation of 300 ft with SPC and MT TENS unit applied to lumbar spine; pt. noting much improved tolerance for 300 ft ambulation simulating home trip to mailbox; pt. noting she was nearly pain free for first 200 ft and with onset of 5/10 hip pain after last 100 ft noting ~ 80% improvement in tolerance for this distance with addition of MT TENS unit       Self-Care   Self-Care  Other Self-Care Comments    Other Self-Care Comments   Reviewed proper use of home TENS MT unit and demo of proper setup and use with therex and gait in session in preperation for pt. receiving her unit tomorrow       Electrical Stimulation   Electrical Stimulation Location  Lumbar spine    Electrical Stimulation Action  TENS MT unit     Electrical Stimulation Parameters  to tolerance, 40' (during session), 150Hz      Electrical Stimulation Goals  Pain               PT Short Term Goals - 11/20/17 1538      PT SHORT TERM GOAL #1   Title  Pt will be independent with HEP     Status  Achieved      PT SHORT TERM GOAL #2   Title  Pt will report pain that is no greater than 7/10 at worst    Status  On-going   rises to 10/10     PT SHORT TERM GOAL #3   Title  Pt will report ability to walk  to the mailbox and back to her house without increased pain    Status  On-going   Notes some improvement in distance she can walk without increased pain however still noting significant pain increase with this activity.         PT Long Term Goals - 11/20/17 1559      PT LONG TERM GOAL #1   Title  Pt will be able to walk 300 ft without increased pain to improve ability to walk around a store    Status  On-going   Pt. still noting signficant increased LBP with 300 ft ambulation with SPC     PT LONG TERM GOAL #2   Title  Pt will have global LE strength of 4/5 to improve tolerance to activities    Status  Achieved      PT LONG TERM GOAL #3   Title  Pt will be able to stand for >/= 15 min without increased pain to improve tolerance to activities of daily living such as cooking    Status  On-going   3-4 min      PT LONG TERM GOAL #4   Title  Pt will report pain at worst no greater than 5/10    Status  On-going            Plan - 11/29/17 1812    Clinical Impression Statement  [name] reporting increased LBP walking in from car to clinic to start session however able to demo much improved tolerance for 300 ft ambulation with SPC.  Pt. with improved tolerance for community distance with gait and for stepping activities in session today with use of test TENS MT unit.  Pt. will receive home TENS MT unit from TENS rep tomorrow and will plan to f/u with pt. regarding any questions in future visits.  Progressing well.      Clinical Impairments Affecting Rehab Potential  Chronicity of condition, complex medical history    PT Treatment/Interventions  ADLs/Self Care Home Management;Cryotherapy;Electrical Stimulation;Iontophoresis 4mg /ml Dexamethasone;Moist Heat;Ultrasound;Gait training;Stair training;Functional mobility training;Therapeutic activities;Therapeutic exercise;DME Instruction;Patient/family education;Manual techniques;Taping;Dry needling    Consulted and Agree with Plan of Care   Patient       Patient will benefit from skilled therapeutic intervention in order to improve the following deficits and impairments:  Abnormal gait, Decreased endurance, Decreased activity tolerance, Decreased strength, Pain, Difficulty walking, Decreased mobility, Increased muscle spasms, Improper body mechanics  Visit Diagnosis: Muscle weakness (generalized)  Difficulty in walking, not elsewhere classified  Chronic left-sided low back pain with left-sided sciatica     Problem List Patient Active Problem List   Diagnosis Date Noted  . Chronic pain 11/03/2017  . Scapholunate ligament injury, no instability 06/29/2017  . HTN (hypertension) 09/27/2016  . Dyspnea 09/27/2016  . Shingles outbreak 10/20/2015  . 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  . Hypothyroidism 07/10/2015  . OSA (obstructive sleep apnea) 07/10/2015  . Endometrial hyperplasia 07/02/2015  . Bronchitis 06/01/2015  . Cough variant asthma 11/02/2014  . Diabetes mellitus ([place])   . Obesity     Bess Harvest, [place] 11/29/17 6:18 PM   [facility] [address]  [place], [place], [address] Phone: [phone]   Fax:  [phone]  Name: TWANISHA FOULK MRN: [id number] Date of Birth: 1950/06/01

## 2017-11-29 NOTE — Patient Instructions (Signed)
TENS stands for Transcutaneous Electrical Nerve Stimulation. In other words, electrical impulses are allowed to pass through the skin in order to excite a nerve.   Purpose and Use of TENS:  TENS is a method used to manage acute and chronic pain without the use of drugs. It has been effective in managing pain associated with surgery, sprains, strains, trauma, rheumatoid arthritis, and neuralgias. It is a non-addictive, low risk, and non-invasive technique used to control pain. It is not, by any means, a curative form of treatment.   How TENS Works:  Most TENS units are a small pocket-sized unit powered by one 9 volt battery. Attached to the outside of the unit are two lead wires where two pins and/or snaps connect on each wire. All units come with a set of four reusable pads or electrodes. These are placed on the skin surrounding the area involved. By inserting the leads into  the pads, the electricity can pass from the unit making the circuit complete.  As the intensity is turned up slowly, the electrical current enters the body from the electrodes through the skin to the surrounding nerve fibers. This triggers the release of hormones from within the body. These hormones contain pain relievers. By increasing the circulation of these hormones, the person's pain may be lessened. It is also believed that the electrical stimulation itself helps to block the pain messages being sent to the brain, thus also decreasing the body's perception of pain.   Hazards:  TENS units are NOT to be used by patients with PACEMAKERS, DEFIBRILLATORS, DIABETIC PUMPS, PREGNANT WOMEN, and patients with SEIZURE DISORDERS.  TENS units are NOT to be used over the heart, throat, brain, or spinal cord.  One of the major side effects from the TENS unit may be skin irritation. Some people may develop a rash if they are sensitive to the materials used in the electrodes or the connecting wires.   Wear the unit for 15'.   Avoid overuse  due the body getting used to the stem making it not as effective over time.    

## 2017-12-04 ENCOUNTER — Ambulatory Visit: Payer: Medicare Other

## 2017-12-04 DIAGNOSIS — M6281 Muscle weakness (generalized): Secondary | ICD-10-CM

## 2017-12-04 DIAGNOSIS — M5442 Lumbago with sciatica, left side: Principal | ICD-10-CM

## 2017-12-04 DIAGNOSIS — G8929 Other chronic pain: Secondary | ICD-10-CM

## 2017-12-04 DIAGNOSIS — R262 Difficulty in walking, not elsewhere classified: Secondary | ICD-10-CM | POA: Diagnosis not present

## 2017-12-04 NOTE — Therapy (Signed)
[facility] [address]  [place], [place], [address] Phone: [phone]   Fax:  [phone]  Physical Therapy Treatment  Patient Details  Name: [name] MRN: [id number] Date of Birth: 1950/12/23 Referring Provider: Ricardo Jericho, MD   Encounter Date: 12/04/2017  PT End of Session - 12/04/17 1543    Visit Number  14    Number of Visits  22    Date for PT Re-Evaluation  01/01/18    Authorization Type  Medicare    PT Start Time  1537    PT Stop Time  1628    PT Time Calculation (min)  51 min    Activity Tolerance  Patient tolerated treatment well    Behavior During Therapy  [facility] for tasks assessed/performed       Past Medical History:  Diagnosis Date  . Allergic rhinitis    Allergy testing: results pending as of 05/28/16 (Dr. Harold Hedge)  . Anemia   . Arthritis   . Cellulitis 5/12   Left (Since replacemnt of Left knee  . Chronic renal insufficiency, stage II (mild) 2015  . Complex endometrial hyperplasia with atypia 12/12   Most recent endometrial bx was 10/2012-NEG  . Cough variant asthma   . DDD (degenerative disc disease), lumbar   . Diabetes mellitus ([place])   . Encounter for insertion of mirena IUD 5/12  . Fracture, foot 8/14   Hx of left foot stress fracture  . GERD (gastroesophageal reflux disease)   . Hemorrhoids   . Herpes zoster 10/2015   L side belt-line  . History of adenomatous polyp of colon 02/25/13   5 mm cecal polyp removed by Dr. Trevor Mace 5 yrs  . History of blood transfusion   . Hyperlipidemia    Not on statin b/c lipids "stayed down when sugars came down" per pt.  She says Dr. Chalmers Cater knows she is not on statin anymore.  . Hypertension   . Hypothyroidism   . Nephrolithiasis 4/07  . Obesity   . OSA on CPAP   . Osteoarthritis of both knees    Bilat TKA  . Osteoarthritis of right ankle and foot   . Osteoarthritis of right wrist    + ? scapholunate ligament disruption (x-ray  06/2017)--ortho referral.  . Sciatica of left side   . Severe persistent asthma    cough-variant--saw Allergist 05/25/16 and was switched from max dose advair to symbicort.  . Systolic murmur    ECHO fine 10/2016-->murmur likely flow murmur assoc with HTN.  Marland Kitchen UTI (lower urinary tract infection)   . Zoon's vulvitis    Bx-proven (GYN) -lichen sclerosis.  Clobetasol 0.05% ointment per GYN    Past Surgical History:  Procedure Laterality Date  . ANKLE FRACTURE SURGERY  1984   Pin & repair  . ARTHROSCOPIC REPAIR ACL  2000   Due to ACL tear  . ARTHROSCOPIC REPAIR ACL  5/08  . Blateral knee rerlacements x4 2 times each knee    . BREAST BIOPSY Right 2014   Benign  . CARDIAC CATHETERIZATION  5/07   clear vessel mild mitral   . CARPAL TUNNEL RELEASE Right [id number]   1989 Left  . CHOLECYSTECTOMY  1988  . COLONOSCOPY N/A 02/25/2013   Tubular adenoma x 1: Recall 5 yrs. Procedure: COLONOSCOPY;  Surgeon: Juanita Craver, MD;  Location: [facility];  Service: Endoscopy;  Laterality: N/A;  . COMBINED HYSTEROSCOPY DIAGNOSTIC / D&C  2/12   Bx neg  .  DEXA  08/2007   Bone density normal.  . DILATION AND CURETTAGE OF UTERUS  12/11  . REPLACEMENT TOTAL KNEE Left 5/12   X 2 on each  . TRANSTHORACIC ECHOCARDIOGRAM  10/11/2016    EF 55-60%, grd I DD.  . TUBAL LIGATION  1985   C-Section    There were no vitals filed for this visit.  Subjective Assessment - 12/04/17 1542    Subjective  Pt. noting she, "overdid it over the weekend", while shopping.      Patient Stated Goals  to decrease pain    Currently in Pain?  Yes    Pain Score  9     Pain Location  Hip    Pain Orientation  Left    Pain Descriptors / Indicators  Aching    Pain Type  Chronic pain    Pain Onset  More than a month ago    Pain Frequency  Intermittent    Aggravating Factors   increased this weekend with prolonged shopping with husband    Pain Relieving Factors  sitting     Multiple Pain Sites  No         [facility] PT Assessment  - 12/04/17 1557      Assessment   Medical Diagnosis  LBP with L sided sciatica    Referring Provider  Ricardo Jericho, MD    Next MD Visit  01/03/18      Prior Function   Level of Independence  Independent with basic ADLs    Vocation  Retired;On disability    Vocation Requirements  Retired Marine scientist    Leisure  Take care of dogs, fill bird feeder      Strength   Right/Left Hip  Right;Left    Right Hip Flexion  4+/5    Right Hip Extension  4+/5    Right Hip External Rotation   4+/5    Right Hip Internal Rotation  4+/5    Right Hip ABduction  4+/5    Right Hip ADduction  4+/5    Left Hip Flexion  4+/5    Left Hip Extension  4+/5    Left Hip External Rotation  4+/5    Left Hip Internal Rotation  4+/5    Left Hip ABduction  4+/5    Left Hip ADduction  4/5    Right/Left Knee  Right;Left    Right Knee Flexion  5/5    Right Knee Extension  4+/5    Left Knee Flexion  5/5    Left Knee Extension  5/5                   [facility] Adult PT Treatment/Exercise - 12/04/17 1619      Ambulation/Gait   Ambulation/Gait Assistance  6: Modified independent (Device/Increase time)    Ambulation Distance (Feet)  300 Feet    Assistive device  Straight cane    Gait Pattern  Decreased arm swing - right;Decreased step length - right;Decreased arm swing - left;Decreased step length - left;Decreased stride length;Decreased hip/knee flexion - right;Decreased hip/knee flexion - left;Lateral trunk lean to right;Lateral trunk lean to left    Ambulation Surface  Level;Indoor    Gait Comments  Pt. able to ambulate x 300 ft with SPC and MT TENS unit reporting no greater than 5/10 pain and visibly fatigued following this distance       Self-Care   Self-Care  Other Self-Care Comments    Other Self-Care Comments   Reviewed strategies/positioning with  kitchen work with foot on edge of cabinet to improve tolerance for prolonged kitchen work and to reduce lumbar strain       Lumbar Exercises: Seated   Other  Seated Lumbar Exercises  Seated abdominal brace + scapular retraction 5" x 15 reps     Other Seated Lumbar Exercises  Seated adduction ball squeeze with legs straight and heels on ground 5" x 15 reps       Knee/Hip Exercises: Aerobic   Nustep  L5 x 7 min      Knee/Hip Exercises: Standing   Hip ADduction  Left;15 reps;Strengthening    Hip ADduction Limitations  green TB standing at chair     Forward Step Up  Left;20 reps;Step Height: 6";Hand Hold: 1    Forward Step Up Limitations  chair on R to simulate 17 stairs navigation at home and pt. leading with L LE up as she climbs stairs at home   Pt. visibly fatigued following   Functional Squat  15 reps;3 seconds      Electrical Stimulation   Electrical Stimulation Location  Lumbar spine    Electrical Stimulation Action  TENS MT unit utilized for ~ 30 min during session with therex             PT Education - 12/04/17 1639    Education Details  Forward step up 2 x 5 reps,  2x/day for improved endurance with stair climbing     Person(s) Educated  Patient    Methods  Explanation;Verbal cues;Handout;Demonstration    Comprehension  Verbalized understanding;Returned demonstration;Verbal cues required;Need further instruction       PT Short Term Goals - 12/04/17 1604      PT SHORT TERM GOAL #1   Title  Pt will be independent with HEP     Status  Achieved      PT SHORT TERM GOAL #2   Title  Pt will report pain that is no greater than 7/10 at worst    Status  On-going   rises to 10/10     PT SHORT TERM GOAL #3   Title  Pt will report ability to walk to the mailbox and back to her house without increased pain    Status  On-going        PT Long Term Goals - 12/04/17 1605      PT LONG TERM GOAL #1   Title  Pt will be able to walk 300 ft without increased pain to improve ability to walk around a store    Status  On-going   Pt. still noting signficant increased LBP with 300 ft ambulation with [facility]   Target Date  01/01/18       PT LONG TERM GOAL #2   Title  Pt will have global LE strength of 4/5 to improve tolerance to activities    Status  Achieved      PT LONG TERM GOAL #3   Title  Pt will be able to stand for >/= 15 min without increased pain to improve tolerance to activities of daily living such as cooking    Status  On-going   Notes 4 min tolerance before needing to sit due to back pain   Target Date  01/01/18      PT LONG TERM GOAL #4   Title  Pt will report pain at worst no greater than 5/10    Status  On-going    Target Date  01/01/18  Plan - 12/04/17 1544    Clinical Impression Statement  Ichelle has made good progress with therapy.  Able to demo good strength improvement with MMT today and notes ~50% improvement in pain levels since starting therapy.  Feels she understands proper setup and use of home TENS MT unit however has not yet tried this unit at home with walking to mailbox and stair navigation.  Pt. strongly encouraged to use TENS unit with these tasks as to trial for hopeful improvement in tolerance.  Pt. noting LBP still rises at times of prolonged walking and standing with daily activities to 10/10 at worst.  Wishes to continue with therapy and has demonstrated significant LE strength and functional improvement as she is now more able to navigate community however STG's/LTG's grossly not met at this time due to remaining high pain levels.  Pt. will continue to benefit from further skilled therapy to maximize tolerance for functional activities.    Rehab Potential  Fair    Clinical Impairments Affecting Rehab Potential  Chronicity of condition, complex medical history    PT Treatment/Interventions  ADLs/Self Care Home Management;Cryotherapy;Electrical Stimulation;Iontophoresis 38m/ml Dexamethasone;Moist Heat;Ultrasound;Gait training;Stair training;Functional mobility training;Therapeutic activities;Therapeutic exercise;DME Instruction;Patient/family education;Manual  techniques;Taping;Dry needling    Consulted and Agree with Plan of Care  Patient       Patient will benefit from skilled therapeutic intervention in order to improve the following deficits and impairments:  Abnormal gait, Decreased endurance, Decreased activity tolerance, Decreased strength, Pain, Difficulty walking, Decreased mobility, Increased muscle spasms, Improper body mechanics  Visit Diagnosis: Chronic left-sided low back pain with left-sided sciatica  Muscle weakness (generalized)  Difficulty in walking, not elsewhere classified     Problem List Patient Active Problem List   Diagnosis Date Noted  . Chronic pain 11/03/2017  . Scapholunate ligament injury, no instability 06/29/2017  . HTN (hypertension) 09/27/2016  . Dyspnea 09/27/2016  . Shingles outbreak 10/20/2015  . 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  . Hypothyroidism 07/10/2015  . OSA (obstructive sleep apnea) 07/10/2015  . Endometrial hyperplasia 07/02/2015  . Bronchitis 06/01/2015  . Cough variant asthma 11/02/2014  . Diabetes mellitus ([place]   . Obesity     MBess Harvest PDelaware08/26/19 6:38 PM  JPercival Spanish PT, MPT 12/04/17, 6:38 PM  [facility] [place] [address]Phone: [phone]  Fax:  [phone] Name: NNISHIKA PARKHURSTMRN: [id number] of Birth: 9February 24, 1952

## 2017-12-06 ENCOUNTER — Ambulatory Visit: Payer: Medicare Other

## 2017-12-06 DIAGNOSIS — G8929 Other chronic pain: Secondary | ICD-10-CM

## 2017-12-06 DIAGNOSIS — M6281 Muscle weakness (generalized): Secondary | ICD-10-CM | POA: Diagnosis not present

## 2017-12-06 DIAGNOSIS — M5442 Lumbago with sciatica, left side: Secondary | ICD-10-CM | POA: Diagnosis not present

## 2017-12-06 DIAGNOSIS — R262 Difficulty in walking, not elsewhere classified: Secondary | ICD-10-CM | POA: Diagnosis not present

## 2017-12-06 NOTE — Therapy (Signed)
[facility] [address]  [place], [place], [address] Phone: [phone]   Fax:  [phone]  Physical Therapy Treatment  Patient Details  Name: [name] MRN: [id number] Date of Birth: Nov 18, 1950 Referring Provider: Ricardo Jericho, MD   Encounter Date: 12/06/2017  PT End of Session - 12/06/17 1534    Visit Number  15    Number of Visits  22    Date for PT Re-Evaluation  01/01/18    Authorization Type  Medicare    PT Start Time  6644    PT Stop Time  1617    PT Time Calculation (min)  46 min    Activity Tolerance  Patient tolerated treatment well    Behavior During Therapy  [facility] for tasks assessed/performed       Past Medical History:  Diagnosis Date  . Allergic rhinitis    Allergy testing: results pending as of 05/28/16 (Dr. Harold Hedge)  . Anemia   . Arthritis   . Cellulitis 5/12   Left (Since replacemnt of Left knee  . Chronic renal insufficiency, stage II (mild) 2015  . Complex endometrial hyperplasia with atypia 12/12   Most recent endometrial bx was 10/2012-NEG  . Cough variant asthma   . DDD (degenerative disc disease), lumbar   . Diabetes mellitus ([employer])   . Encounter for insertion of mirena IUD 5/12  . Fracture, foot 8/14   Hx of left foot stress fracture  . GERD (gastroesophageal reflux disease)   . Hemorrhoids   . Herpes zoster 10/2015   L side belt-line  . History of adenomatous polyp of colon 02/25/13   5 mm cecal polyp removed by Dr. Trevor Mace 5 yrs  . History of blood transfusion   . Hyperlipidemia    Not on statin b/c lipids "stayed down when sugars came down" per pt.  She says Dr. Chalmers Cater knows she is not on statin anymore.  . Hypertension   . Hypothyroidism   . Nephrolithiasis 4/07  . Obesity   . OSA on CPAP   . Osteoarthritis of both knees    Bilat TKA  . Osteoarthritis of right ankle and foot   . Osteoarthritis of right wrist    + ? scapholunate ligament disruption (x-ray  06/2017)--ortho referral.  . Sciatica of left side   . Severe persistent asthma    cough-variant--saw Allergist 05/25/16 and was switched from max dose advair to symbicort.  . Systolic murmur    ECHO fine 10/2016-->murmur likely flow murmur assoc with HTN.  Marland Kitchen UTI (lower urinary tract infection)   . Zoon's vulvitis    Bx-proven (GYN) -lichen sclerosis.  Clobetasol 0.05% ointment per GYN    Past Surgical History:  Procedure Laterality Date  . ANKLE FRACTURE SURGERY  1984   Pin & repair  . ARTHROSCOPIC REPAIR ACL  2000   Due to ACL tear  . ARTHROSCOPIC REPAIR ACL  5/08  . Blateral knee rerlacements x4 2 times each knee    . BREAST BIOPSY Right 2014   Benign  . CARDIAC CATHETERIZATION  5/07   clear vessel mild mitral   . CARPAL TUNNEL RELEASE Right [id number]   1989 Left  . CHOLECYSTECTOMY  1988  . COLONOSCOPY N/A 02/25/2013   Tubular adenoma x 1: Recall 5 yrs. Procedure: COLONOSCOPY;  Surgeon: Juanita Craver, MD;  Location: [facility];  Service: Endoscopy;  Laterality: N/A;  . COMBINED HYSTEROSCOPY DIAGNOSTIC / D&C  2/12   Bx neg  .  DEXA  08/2007   Bone density normal.  . DILATION AND CURETTAGE OF UTERUS  12/11  . REPLACEMENT TOTAL KNEE Left 5/12   X 2 on each  . TRANSTHORACIC ECHOCARDIOGRAM  10/11/2016    EF 55-60%, grd I DD.  . TUBAL LIGATION  1985   C-Section    There were no vitals filed for this visit.  Subjective Assessment - 12/06/17 1533    Subjective  Pt. noting she used TENS unit for walk to mailbox at home and stairs navigation with much improved tolerance.      Patient Stated Goals  to decrease pain    Currently in Pain?  Yes    Pain Score  8     Pain Location  Hip    Pain Orientation  Left    Pain Descriptors / Indicators  Aching    Pain Type  Chronic pain    Pain Onset  More than a month ago    Pain Frequency  Intermittent    Aggravating Factors   Prolonged walking, prolonged standing, stairs navigation    Pain Relieving Factors  sitting     Multiple Pain  Sites  No    Pain Score  3    Pain Location  Ankle    Pain Orientation  Right    Pain Descriptors / Indicators  Aching   "strong"   Pain Type  Chronic pain    Pain Onset  More than a month ago    Pain Frequency  Intermittent    Aggravating Factors   Worse at night                       [facility] Adult PT Treatment/Exercise - 12/06/17 1541      Lumbar Exercises: Standing   Row  Both;Strengthening;15 reps    Theraband Level (Row)  Level 3 (Green)    Shoulder Extension  Both;15 reps;Theraband   Cues required for scapular squeeze    Theraband Level (Shoulder Extension)  Level 1 (Yellow)    Other Standing Lumbar Exercises  B pallof press in staggered stance with green TB x 10 reps each way       Knee/Hip Exercises: Aerobic   Nustep  L5 x 6 min (keeping a 50 step/min pace)   Utilized at end of session to promote LE endurance      Knee/Hip Exercises: Standing   Hip Flexion  Right;Left;Stengthening;15 reps;Knee straight    Hip Flexion Limitations  Red TB at ankle; chair     Hip Abduction  Right;Left;Knee straight;Stengthening;15 reps    Abduction Limitations  red looped TB at ankle; at chair     Hip Extension  Right;Left;Knee straight;Stengthening;15 reps    Extension Limitations  45 dg kickback with red looped TB     Forward Step Up  Left;10 reps;Hand Hold: 2;Step Height: 8"      Electrical Stimulation   Electrical Stimulation Location  Lumbar spine    Electrical Stimulation Action  TENS MT unit - utilized throughout session ~ 40 min     Electrical Stimulation Goals  Pain               PT Short Term Goals - 12/06/17 1553      PT SHORT TERM GOAL #1   Title  Pt will be independent with HEP     Status  Achieved      PT SHORT TERM GOAL #2   Title  Pt will report pain that  is no greater than 7/10 at worst    Status  On-going   Pain rises to 5/10 at worst with TENS unit use     PT SHORT TERM GOAL #3   Title  Pt will report ability to walk to the mailbox  and back to her house without increased pain    Status  Partially Met   Pt. noting walking to mail box is tolerable now limited by fatigue and pain only rises to 5/10.         PT Long Term Goals - 12/04/17 1605      PT LONG TERM GOAL #1   Title  Pt will be able to walk 300 ft without increased pain to improve ability to walk around a store    Status  On-going   Pt. still noting signficant increased LBP with 300 ft ambulation with [facility]   Target Date  01/01/18      PT LONG TERM GOAL #2   Title  Pt will have global LE strength of 4/5 to improve tolerance to activities    Status  Achieved      PT LONG TERM GOAL #3   Title  Pt will be able to stand for >/= 15 min without increased pain to improve tolerance to activities of daily living such as cooking    Status  On-going   Notes 4 min tolerance before needing to sit due to back pain   Target Date  01/01/18      PT LONG TERM GOAL #4   Title  Pt will report pain at worst no greater than 5/10    Status  On-going    Target Date  01/01/18            Plan - 12/06/17 1536    Clinical Impression Statement  Pt. noting much improved tolerance for stair navigation and walking trip to mail box with use of home TENS/MT unit.  Notes pain only rises at most to 5/10 with TENS unit with these activities and feels fatigue is limiting factor with these tasks now.  Focused session today on standing lumbopelvic strengthening activities to promote LE endurance and reduce fatigue with functional tasks at home.  Pt. tolerated progression to 8" L step-up, and advancement of scapular strengthening for improved postural endurance well today.  Pt. encouraged to utilized TENS unit at home to improve tolerance for regular walking ~ 300 ft throughout day to improve LE endurance.  Pt. verbalized understanding of need for  ongoing physical activity as it relates to cardiovascular endurance.  Will continue to progress toward goals.         Patient will benefit  from skilled therapeutic intervention in order to improve the following deficits and impairments:  Abnormal gait, Decreased endurance, Decreased activity tolerance, Decreased strength, Pain, Difficulty walking, Decreased mobility, Increased muscle spasms, Improper body mechanics  Visit Diagnosis: Chronic left-sided low back pain with left-sided sciatica  Muscle weakness (generalized)  Difficulty in walking, not elsewhere classified     Problem List Patient Active Problem List   Diagnosis Date Noted  . Chronic pain 11/03/2017  . Scapholunate ligament injury, no instability 06/29/2017  . HTN (hypertension) 09/27/2016  . Dyspnea 09/27/2016  . Shingles outbreak 10/20/2015  . 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  . Hypothyroidism 07/10/2015  . OSA (obstructive sleep apnea) 07/10/2015  . Endometrial hyperplasia 07/02/2015  . Bronchitis 06/01/2015  . Cough variant asthma 11/02/2014  . Diabetes mellitus ([place])   . Obesity  Bess Harvest, PTA 12/06/17 4:36 PM     [facility] [address]  [place], [place], [address] Phone: [phone]   Fax:  [phone]  Name: HAILLEY BYERS MRN: [id number] Date of Birth: November 05, 1950

## 2017-12-13 ENCOUNTER — Ambulatory Visit: Payer: Medicare Other

## 2017-12-18 ENCOUNTER — Ambulatory Visit: Payer: Medicare Other | Attending: Family Medicine | Admitting: Physical Therapy

## 2017-12-18 ENCOUNTER — Encounter: Payer: Self-pay | Admitting: Physical Therapy

## 2017-12-18 DIAGNOSIS — M6281 Muscle weakness (generalized): Secondary | ICD-10-CM

## 2017-12-18 DIAGNOSIS — R262 Difficulty in walking, not elsewhere classified: Secondary | ICD-10-CM

## 2017-12-18 DIAGNOSIS — M5442 Lumbago with sciatica, left side: Secondary | ICD-10-CM | POA: Insufficient documentation

## 2017-12-18 DIAGNOSIS — G8929 Other chronic pain: Secondary | ICD-10-CM | POA: Diagnosis not present

## 2017-12-18 NOTE — Therapy (Signed)
[facility] [address]  [place], [place], [address] Phone: [phone]   Fax:  [phone]  Physical Therapy Treatment  Patient Details  Name: [name] MRN: [id number] Date of Birth: 09/21/1950 Referring Provider: Ricardo Jericho, MD   Encounter Date: 12/18/2017  PT End of Session - 12/18/17 1535    Visit Number  16    Number of Visits  22    Date for PT Re-Evaluation  01/01/18    Authorization Type  Medicare    PT Start Time  1607    PT Stop Time  1624    PT Time Calculation (min)  49 min    Activity Tolerance  Patient tolerated treatment well    Behavior During Therapy  [facility] for tasks assessed/performed       Past Medical History:  Diagnosis Date  . Allergic rhinitis    Allergy testing: results pending as of 05/28/16 (Dr. Harold Hedge)  . Anemia   . Arthritis   . Cellulitis 5/12   Left (Since replacemnt of Left knee  . Chronic renal insufficiency, stage II (mild) 2015  . Complex endometrial hyperplasia with atypia 12/12   Most recent endometrial bx was 10/2012-NEG  . Cough variant asthma   . DDD (degenerative disc disease), lumbar   . Diabetes mellitus ([place])   . Encounter for insertion of mirena IUD 5/12  . Fracture, foot 8/14   Hx of left foot stress fracture  . GERD (gastroesophageal reflux disease)   . Hemorrhoids   . Herpes zoster 10/2015   L side belt-line  . History of adenomatous polyp of colon 02/25/13   5 mm cecal polyp removed by Dr. Trevor Mace 5 yrs  . History of blood transfusion   . Hyperlipidemia    Not on statin b/c lipids "stayed down when sugars came down" per pt.  She says Dr. Chalmers Cater knows she is not on statin anymore.  . Hypertension   . Hypothyroidism   . Nephrolithiasis 4/07  . Obesity   . OSA on CPAP   . Osteoarthritis of both knees    Bilat TKA  . Osteoarthritis of right ankle and foot   . Osteoarthritis of right wrist    + ? scapholunate ligament disruption (x-ray  06/2017)--ortho referral.  . Sciatica of left side   . Severe persistent asthma    cough-variant--saw Allergist 05/25/16 and was switched from max dose advair to symbicort.  . Systolic murmur    ECHO fine 10/2016-->murmur likely flow murmur assoc with HTN.  Marland Kitchen UTI (lower urinary tract infection)   . Zoon's vulvitis    Bx-proven (GYN) -lichen sclerosis.  Clobetasol 0.05% ointment per GYN    Past Surgical History:  Procedure Laterality Date  . ANKLE FRACTURE SURGERY  1984   Pin & repair  . ARTHROSCOPIC REPAIR ACL  2000   Due to ACL tear  . ARTHROSCOPIC REPAIR ACL  5/08  . Blateral knee rerlacements x4 2 times each knee    . BREAST BIOPSY Right 2014   Benign  . CARDIAC CATHETERIZATION  5/07   clear vessel mild mitral   . CARPAL TUNNEL RELEASE Right [id number]   1989 Left  . CHOLECYSTECTOMY  1988  . COLONOSCOPY N/A 02/25/2013   Tubular adenoma x 1: Recall 5 yrs. Procedure: COLONOSCOPY;  Surgeon: Juanita Craver, MD;  Location: [facility];  Service: Endoscopy;  Laterality: N/A;  . COMBINED HYSTEROSCOPY DIAGNOSTIC / D&C  2/12   Bx neg  .  DEXA  08/2007   Bone density normal.  . DILATION AND CURETTAGE OF UTERUS  12/11  . REPLACEMENT TOTAL KNEE Left 5/12   X 2 on each  . TRANSTHORACIC ECHOCARDIOGRAM  10/11/2016    EF 55-60%, grd I DD.  . TUBAL LIGATION  1985   C-Section    There were no vitals filed for this visit.  Subjective Assessment - 12/18/17 1540    Subjective  Pt reporting minimal to no pain when sitting, but still experiences significantly increased pain when she has to walk anywhere.    Patient Stated Goals  to decrease pain    Currently in Pain?  Yes    Pain Score  8    8.5/10 walking into PT   Pain Location  Hip    Pain Orientation  Left;Posterior    Pain Descriptors / Indicators  Sharp;Aching    Pain Type  Chronic pain    Pain Frequency  Intermittent    Pain Onset  More than a month ago                       [facility] Adult PT Treatment/Exercise -  12/18/17 1535      Exercises   Exercises  Lumbar      Lumbar Exercises: Aerobic   Nustep  L5 x 6 min      Lumbar Exercises: Standing   Heel Raises  10 reps;3 seconds    Heel Raises Limitations  UE support on back of chair    Functional Squats  15 reps;3 seconds    Functional Squats Limitations  minsquat with UE support on chair    Forward Lunge  10 reps;2 seconds   each with R/L LE fwd   Forward Lunge Limitations  single UE support on back fo chair    Other Standing Lumbar Exercises  Alt toe clears to 8" step x 10 each    Other Standing Lumbar Exercises  B side stepping with red TB at ankle 2 x 12 ft      Lumbar Exercises: Seated   Sit to Stand  5 reps    Sit to Stand Limitations  cues for fwd weight to edge of seatt to minimize trunk flexion upon rising    Other Seated Lumbar Exercises  Scap retraction/rows with red TB x10 seated on green Pball    Other Seated Lumbar Exercises  B pallof press with double red TB x10 seated on green Pball      Electrical Stimulation   Electrical Stimulation Location  Lumbar paraspinals     Electrical Stimulation Action  TENS    Electrical Stimulation Parameters  to pt tolerance during duration of therapy session    Electrical Stimulation Goals  Pain               PT Short Term Goals - 12/18/17 1635      PT SHORT TERM GOAL #1   Title  Pt will be independent with HEP     Status  Achieved      PT SHORT TERM GOAL #2   Title  Pt will report pain that is no greater than 7/10 at worst    Status  On-going      PT SHORT TERM GOAL #3   Title  Pt will report ability to walk to the mailbox and back to her house without increased pain    Status  Achieved   with use of TENS unit  PT Long Term Goals - 12/18/17 1636      PT LONG TERM GOAL #1   Title  Pt will be able to walk 300 ft without increased pain to improve ability to walk around a store    Status  On-going      PT LONG TERM GOAL #2   Title  Pt will have global LE strength  of 4/5 to improve tolerance to activities    Status  Achieved      PT LONG TERM GOAL #3   Title  Pt will be able to stand for >/= 15 min without increased pain to improve tolerance to activities of daily living such as cooking    Status  On-going      PT LONG TERM GOAL #4   Title  Pt will report pain at worst no greater than 5/10    Status  On-going            Plan - 12/18/17 1627    Clinical Impression Statement  [place] continues to note improved activity tolerance with use of TENS unit during exercises and functional activites with current limitations more fatigue related, but states she remains very limited by pain w/o TENS. Able complete most of therapy session exercises and activies in standing w/o increased back pain while utilizing TENS unit, but still requiring frequent rest breaks due to fatigue. Discussed expected outcome of therapy is unlikely to be full resolution of pain, but rather that pt should feel comfortable with pain management strategies, good awarenss of proper spinal posture and body mechanics with typical daily tasks, and ongoing HEP to continue to promote increased strength and activity tolerance.    Rehab Potential  Fair    Clinical Impairments Affecting Rehab Potential  Chronicity of condition, morbid obesity, complex medical history       Patient will benefit from skilled therapeutic intervention in order to improve the following deficits and impairments:  Abnormal gait, Decreased endurance, Decreased activity tolerance, Decreased strength, Pain, Difficulty walking, Decreased mobility, Increased muscle spasms, Improper body mechanics  Visit Diagnosis: Chronic left-sided low back pain with left-sided sciatica  Muscle weakness (generalized)  Difficulty in walking, not elsewhere classified     Problem List Patient Active Problem List   Diagnosis Date Noted  . Chronic pain 11/03/2017  . Scapholunate ligament injury, no instability 06/29/2017  . HTN  (hypertension) 09/27/2016  . Dyspnea 09/27/2016  . Shingles outbreak 10/20/2015  . 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  . Hypothyroidism 07/10/2015  . OSA (obstructive sleep apnea) 07/10/2015  . Endometrial hyperplasia 07/02/2015  . Bronchitis 06/01/2015  . Cough variant asthma 11/02/2014  . Diabetes mellitus ([place])   . Obesity     Percival Spanish, PT, MPT 12/18/2017, 4:42 PM  [facility] [address]  [place], [place], [address] Phone: [phone]   Fax:  [phone]  Name: [name] MRN: [id number] Date of Birth: 1950/11/13

## 2017-12-20 ENCOUNTER — Ambulatory Visit: Payer: Medicare Other

## 2017-12-20 DIAGNOSIS — M6281 Muscle weakness (generalized): Secondary | ICD-10-CM

## 2017-12-20 DIAGNOSIS — G8929 Other chronic pain: Secondary | ICD-10-CM | POA: Diagnosis not present

## 2017-12-20 DIAGNOSIS — R262 Difficulty in walking, not elsewhere classified: Secondary | ICD-10-CM

## 2017-12-20 DIAGNOSIS — M5442 Lumbago with sciatica, left side: Principal | ICD-10-CM

## 2017-12-20 NOTE — Therapy (Signed)
[facility] [address]  [place], [place], [address] Phone: [phone]   Fax:  [phone]  Physical Therapy Treatment  Patient Details  Name: [name] MRN: [id number] Date of Birth: 1950/08/19 Referring Provider: Ricardo Jericho, MD   Encounter Date: 12/20/2017  PT End of Session - 12/20/17 1543    Visit Number  17    Number of Visits  22    Date for PT Re-Evaluation  01/01/18    Authorization Type  Medicare    PT Start Time  1538    PT Stop Time  1628    PT Time Calculation (min)  50 min    Activity Tolerance  Patient tolerated treatment well    Behavior During Therapy  [facility] for tasks assessed/performed       Past Medical History:  Diagnosis Date  . Allergic rhinitis    Allergy testing: results pending as of 05/28/16 (Dr. Harold Hedge)  . Anemia   . Arthritis   . Cellulitis 5/12   Left (Since replacemnt of Left knee  . Chronic renal insufficiency, stage II (mild) 2015  . Complex endometrial hyperplasia with atypia 12/12   Most recent endometrial bx was 10/2012-NEG  . Cough variant asthma   . DDD (degenerative disc disease), lumbar   . Diabetes mellitus ([place])   . Encounter for insertion of mirena IUD 5/12  . Fracture, foot 8/14   Hx of left foot stress fracture  . GERD (gastroesophageal reflux disease)   . Hemorrhoids   . Herpes zoster 10/2015   L side belt-line  . History of adenomatous polyp of colon 02/25/13   5 mm cecal polyp removed by Dr. Trevor Mace 5 yrs  . History of blood transfusion   . Hyperlipidemia    Not on statin b/c lipids "stayed down when sugars came down" per pt.  She says Dr. Chalmers Cater knows she is not on statin anymore.  . Hypertension   . Hypothyroidism   . Nephrolithiasis 4/07  . Obesity   . OSA on CPAP   . Osteoarthritis of both knees    Bilat TKA  . Osteoarthritis of right ankle and foot   . Osteoarthritis of right wrist    + ? scapholunate ligament disruption (x-ray  06/2017)--ortho referral.  . Sciatica of left side   . Severe persistent asthma    cough-variant--saw Allergist 05/25/16 and was switched from max dose advair to symbicort.  . Systolic murmur    ECHO fine 10/2016-->murmur likely flow murmur assoc with HTN.  Marland Kitchen UTI (lower urinary tract infection)   . Zoon's vulvitis    Bx-proven (GYN) -lichen sclerosis.  Clobetasol 0.05% ointment per GYN    Past Surgical History:  Procedure Laterality Date  . ANKLE FRACTURE SURGERY  1984   Pin & repair  . ARTHROSCOPIC REPAIR ACL  2000   Due to ACL tear  . ARTHROSCOPIC REPAIR ACL  5/08  . Blateral knee rerlacements x4 2 times each knee    . BREAST BIOPSY Right 2014   Benign  . CARDIAC CATHETERIZATION  5/07   clear vessel mild mitral   . CARPAL TUNNEL RELEASE Right [id number]   1989 Left  . CHOLECYSTECTOMY  1988  . COLONOSCOPY N/A 02/25/2013   Tubular adenoma x 1: Recall 5 yrs. Procedure: COLONOSCOPY;  Surgeon: Juanita Craver, MD;  Location: [facility];  Service: Endoscopy;  Laterality: N/A;  . COMBINED HYSTEROSCOPY DIAGNOSTIC / D&C  2/12   Bx neg  .  DEXA  08/2007   Bone density normal.  . DILATION AND CURETTAGE OF UTERUS  12/11  . REPLACEMENT TOTAL KNEE Left 5/12   X 2 on each  . TRANSTHORACIC ECHOCARDIOGRAM  10/11/2016    EF 55-60%, grd I DD.  . TUBAL LIGATION  1985   C-Section    There were no vitals filed for this visit.  Subjective Assessment - 12/20/17 1541    Subjective  Doing ok today.  R wrist with increased pain today without known trigger.      Patient Stated Goals  to decrease pain    Currently in Pain?  Yes    Pain Score  9     Pain Location  Hip    Pain Orientation  Left;Posterior    Pain Descriptors / Indicators  Sharp;Aching    Pain Type  Chronic pain    Pain Radiating Towards  into back while standing    Pain Onset  More than a month ago    Pain Frequency  Intermittent    Aggravating Factors   prolonged walking, prolonged standing, stair navigation     Pain Relieving  Factors  sitting     Multiple Pain Sites  Yes    Pain Score  10    Pain Location  Wrist    Pain Orientation  Right;Lateral;Posterior    Pain Descriptors / Indicators  Sharp;Aching   "achy most of the time"   Pain Type  Chronic pain    Pain Onset  More than a month ago    Pain Frequency  Constant    Aggravating Factors   using it in general     Pain Relieving Factors  unsure                        [facility] Adult PT Treatment/Exercise - 12/20/17 1559      Self-Care   Self-Care  Other Self-Care Comments    Other Self-Care Comments   Reviewed proper body mechanics with kitchen work, carrying groceries, seated and standing positioning, and sit<>stand transitions in order to reduce lumbar strain      Lumbar Exercises: Standing   Row  Right;Left;10 reps;Self ROM   2 sets    Theraband Level (Row)  Level 3 (Green)   staggered stance    Row Limitations  B single UE row with green TB     Other Standing Lumbar Exercises  Monster walk with red band 2 x 15 ft forward/backwards   Cues provided to brace abdominals to reduce lumbar strain   Other Standing Lumbar Exercises  B side stepping with red TB at ankle 2 x 20 ft   Cues provided to brace abdominals to reduce lumbar strain     Lumbar Exercises: Seated   Sit to Stand  10 reps   no UE support   Sit to Stand Limitations  from lowered adjustable table with 4" step under feet (approx height 15")             PT Education - 12/20/17 1634    Education Details  HEp update; green TB for pallof press issued to pt.     Person(s) Educated  Patient    Methods  Explanation;Demonstration;Verbal cues;Handout    Comprehension  Verbalized understanding;Returned demonstration;Verbal cues required;Need further instruction       PT Short Term Goals - 12/18/17 1635      PT SHORT TERM GOAL #1   Title  Pt will be independent with  HEP     Status  Achieved      PT SHORT TERM GOAL #2   Title  Pt will report pain that is no greater  than 7/10 at worst    Status  On-going      PT SHORT TERM GOAL #3   Title  Pt will report ability to walk to the mailbox and back to her house without increased pain    Status  Achieved   with use of TENS unit       PT Long Term Goals - 12/18/17 1636      PT LONG TERM GOAL #1   Title  Pt will be able to walk 300 ft without increased pain to improve ability to walk around a store    Status  On-going      PT LONG TERM GOAL #2   Title  Pt will have global LE strength of 4/5 to improve tolerance to activities    Status  Achieved      PT LONG TERM GOAL #3   Title  Pt will be able to stand for >/= 15 min without increased pain to improve tolerance to activities of daily living such as cooking    Status  On-going      PT LONG TERM GOAL #4   Title  Pt will report pain at worst no greater than 5/10    Status  On-going            Plan - 12/20/17 1543    Clinical Impression Statement  Session focused on lumbopelvic strengthening and review of strategies for improving body mechanics to decrease lumbar strain with daily tasks.  Pt. tolerated all standing activities well however is still limited primarily by fatigue with use of TENS unit in session with standing strengthening activities.  Discussed use of cardiovascular training equipment such as NuStep (recumbent stepper) for improvement in endurance with functional activities and stair navigation.  HEP updated with Pallof Press with single green TB issued to pt.  Will plan to continue to modify HEP as needed and further discuss coping strategies with daily tasks to reduce lumbar strain in upcoming visits.      Clinical Impairments Affecting Rehab Potential  Chronicity of condition, morbid obesity, complex medical history    PT Treatment/Interventions  ADLs/Self Care Home Management;Cryotherapy;Electrical Stimulation;Iontophoresis 4mg /ml Dexamethasone;Moist Heat;Ultrasound;Gait training;Stair training;Functional mobility  training;Therapeutic activities;Therapeutic exercise;DME Instruction;Patient/family education;Manual techniques;Taping;Dry needling    Consulted and Agree with Plan of Care  Patient       Patient will benefit from skilled therapeutic intervention in order to improve the following deficits and impairments:  Abnormal gait, Decreased endurance, Decreased activity tolerance, Decreased strength, Pain, Difficulty walking, Decreased mobility, Increased muscle spasms, Improper body mechanics  Visit Diagnosis: Chronic left-sided low back pain with left-sided sciatica  Muscle weakness (generalized)  Difficulty in walking, not elsewhere classified     Problem List Patient Active Problem List   Diagnosis Date Noted  . Chronic pain 11/03/2017  . Scapholunate ligament injury, no instability 06/29/2017  . HTN (hypertension) 09/27/2016  . Dyspnea 09/27/2016  . Shingles outbreak 10/20/2015  . 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  . Hypothyroidism 07/10/2015  . OSA (obstructive sleep apnea) 07/10/2015  . Endometrial hyperplasia 07/02/2015  . Bronchitis 06/01/2015  . Cough variant asthma 11/02/2014  . Diabetes mellitus ([place])   . Obesity     Bess Harvest, [place] 12/20/17 4:56 PM   [facility]  [place], [place], [address] Phone: [phone]   Fax:  [phone]  Name: [name] MRN: [id number] Date of Birth: 1950/09/13

## 2017-12-23 ENCOUNTER — Other Ambulatory Visit: Payer: Self-pay | Admitting: Family Medicine

## 2017-12-25 ENCOUNTER — Encounter: Payer: Self-pay | Admitting: Physical Therapy

## 2017-12-25 ENCOUNTER — Ambulatory Visit: Payer: Medicare Other | Admitting: Physical Therapy

## 2017-12-25 DIAGNOSIS — M5442 Lumbago with sciatica, left side: Principal | ICD-10-CM

## 2017-12-25 DIAGNOSIS — M6281 Muscle weakness (generalized): Secondary | ICD-10-CM

## 2017-12-25 DIAGNOSIS — G8929 Other chronic pain: Secondary | ICD-10-CM | POA: Diagnosis not present

## 2017-12-25 DIAGNOSIS — R262 Difficulty in walking, not elsewhere classified: Secondary | ICD-10-CM

## 2017-12-25 NOTE — Therapy (Signed)
[facility] [address]  [place], [place], [address] Phone: [phone]   Fax:  [phone]  Physical Therapy Treatment  Patient Details  Name: [name] MRN: [id number] Date of Birth: 1950/12/10 Referring Provider: Ricardo Jericho, MD   Encounter Date: 12/25/2017  PT End of Session - 12/25/17 1530    Visit Number  18    Number of Visits  22    Date for PT Re-Evaluation  01/01/18    Authorization Type  Medicare    PT Start Time  1530    PT Stop Time  1617    PT Time Calculation (min)  47 min    Activity Tolerance  Patient tolerated treatment well    Behavior During Therapy  [facility] for tasks assessed/performed       Past Medical History:  Diagnosis Date  . Allergic rhinitis    Allergy testing: results pending as of 05/28/16 (Dr. Harold Hedge)  . Anemia   . Arthritis   . Cellulitis 5/12   Left (Since replacemnt of Left knee  . Chronic renal insufficiency, stage II (mild) 2015  . Complex endometrial hyperplasia with atypia 12/12   Most recent endometrial bx was 10/2012-NEG  . Cough variant asthma   . DDD (degenerative disc disease), lumbar   . Diabetes mellitus ([place])   . Encounter for insertion of mirena IUD 5/12  . Fracture, foot 8/14   Hx of left foot stress fracture  . GERD (gastroesophageal reflux disease)   . Hemorrhoids   . Herpes zoster 10/2015   L side belt-line  . History of adenomatous polyp of colon 02/25/13   5 mm cecal polyp removed by Dr. Trevor Mace 5 yrs  . History of blood transfusion   . Hyperlipidemia    Not on statin b/c lipids "stayed down when sugars came down" per pt.  She says Dr. Chalmers Cater knows she is not on statin anymore.  . Hypertension   . Hypothyroidism   . Nephrolithiasis 4/07  . Obesity   . OSA on CPAP   . Osteoarthritis of both knees    Bilat TKA  . Osteoarthritis of right ankle and foot   . Osteoarthritis of right wrist    + ? scapholunate ligament disruption (x-ray  06/2017)--ortho referral.  . Sciatica of left side   . Severe persistent asthma    cough-variant--saw Allergist 05/25/16 and was switched from max dose advair to symbicort.  . Systolic murmur    ECHO fine 10/2016-->murmur likely flow murmur assoc with HTN.  Marland Kitchen UTI (lower urinary tract infection)   . Zoon's vulvitis    Bx-proven (GYN) -lichen sclerosis.  Clobetasol 0.05% ointment per GYN    Past Surgical History:  Procedure Laterality Date  . ANKLE FRACTURE SURGERY  1984   Pin & repair  . ARTHROSCOPIC REPAIR ACL  2000   Due to ACL tear  . ARTHROSCOPIC REPAIR ACL  5/08  . Blateral knee rerlacements x4 2 times each knee    . BREAST BIOPSY Right 2014   Benign  . CARDIAC CATHETERIZATION  5/07   clear vessel mild mitral   . CARPAL TUNNEL RELEASE Right [id number]   1989 Left  . CHOLECYSTECTOMY  1988  . COLONOSCOPY N/A 02/25/2013   Tubular adenoma x 1: Recall 5 yrs. Procedure: COLONOSCOPY;  Surgeon: Juanita Craver, MD;  Location: [facility];  Service: Endoscopy;  Laterality: N/A;  . COMBINED HYSTEROSCOPY DIAGNOSTIC / D&C  2/12   Bx neg  .  DEXA  08/2007   Bone density normal.  . DILATION AND CURETTAGE OF UTERUS  12/11  . REPLACEMENT TOTAL KNEE Left 5/12   X 2 on each  . TRANSTHORACIC ECHOCARDIOGRAM  10/11/2016    EF 55-60%, grd I DD.  . TUBAL LIGATION  1985   C-Section    There were no vitals filed for this visit.  Subjective Assessment - 12/25/17 1533    Subjective  Pt noting relatively new pain across the top of her L foot - not sure if it is arthritis from prior ankle injury.    How long can you sit comfortably?  unlimited     How long can you stand comfortably?  2-3 minutes; up to 5 minutes with TENS unit    How long can you walk comfortably?  100-120 ft; up to 300 yds with TENS unit    Patient Stated Goals  to decrease pain    Currently in Pain?  Yes    Pain Score  9     Pain Location  Back   & hip   Pain Orientation  Left    Pain Descriptors / Indicators  Sharp;Heaviness    "overwhelming"   Pain Type  Chronic pain    Pain Radiating Towards  n/a    Pain Onset  More than a month ago    Pain Frequency  Intermittent    Aggravating Factors   prolonged standing or walking; stairs    Pain Relieving Factors  sitting/reclining; TENS    Effect of Pain on Daily Activities  difficulty walking into a store - still needs to use motorized shopping cart         [facility] PT Assessment - 12/25/17 1530      Assessment   Medical Diagnosis  LBP with L sided sciatica    Referring Provider  Ricardo Jericho, MD    Next MD Visit  01/03/18      Observation/Other Assessments   Focus on Therapeutic Outcomes (FOTO)   Lumbar spine - 37% (63% limitation)      Strength   Overall Strength Comments  tested in sitting due to poor tolerance for standardized testing positions    Right Hip Flexion  4+/5    Right Hip Extension  4+/5    Right Hip External Rotation   4+/5    Right Hip Internal Rotation  4+/5    Right Hip ABduction  4+/5    Right Hip ADduction  4+/5    Left Hip Flexion  4+/5    Left Hip Extension  4+/5    Left Hip External Rotation  4+/5    Left Hip Internal Rotation  4+/5    Left Hip ABduction  4+/5    Left Hip ADduction  4+/5    Right Knee Flexion  5/5    Right Knee Extension  5/5    Left Knee Flexion  5/5    Left Knee Extension  5/5                   [facility] Adult PT Treatment/Exercise - 12/25/17 1530      Exercises   Exercises  Lumbar      Lumbar Exercises: Standing   Row  Both;10 reps;Theraband;Strengthening    Theraband Level (Row)  Level 2 (Red)    Row Limitations  modified red & green TB with loops at ends to accommodate for wrist issues    Shoulder Extension  Both;10 reps;Theraband;Strengthening    Shoulder Extension Limitations  modified red & green TB with loops at ends to accommodate for wrist issues      Knee/Hip Exercises: Seated   Hamstring Curl  Right;Left;15 reps;Strengthening    Hamstring Limitations  red TB - instructed pt how  to tether band in door to avoid having to use opposite LE for bracing band               PT Short Term Goals - 12/25/17 1544      PT SHORT TERM GOAL #1   Title  Pt will be independent with HEP     Status  Achieved      PT SHORT TERM GOAL #2   Title  Pt will report pain that is no greater than 7/10 at worst    Status  Not Met      PT SHORT TERM GOAL #3   Title  Pt will report ability to walk to the mailbox and back to her house without increased pain    Status  Achieved   with use of TENS unit       PT Long Term Goals - 12/25/17 1544      PT LONG TERM GOAL #1   Title  Pt will be able to walk 300 ft without increased pain to improve ability to walk around a store    Status  Partially Met   able to walk >/= 334f with TENS unit     PT LONG TERM GOAL #2   Title  Pt will have global LE strength of 4/5 to improve tolerance to activities    Status  Achieved      PT LONG TERM GOAL #3   Title  Pt will be able to stand for >/= 15 min without increased pain to improve tolerance to activities of daily living such as cooking    Status  Not Met      PT LONG TERM GOAL #4   Title  Pt will report pain at worst no greater than 5/10    Status  Not Met            Plan - 12/25/17 1617    Clinical Impression Statement  NCheyenehas made significant gains in LE strength during this therapy episode with B LE strength now >/= 4+/5. She notes improved awareness of core activation and lumbopelvic stabilization during activities but still reports very limited tolerance for standing or walking activities due to pain and fatigue unless using TENS unit, with pain still getting up to 9/10 at times without TENS unit. She denies any questions or concerns with ongoing HEP, with slight modifications made during review today to accommodate for issues with her R wrist. She acknowledges good awareness of proper posture and body mechanics with daily activities and household chores. Goals only partially  met at this time due to ongoing limitations with activity tolerance and high pain ratings, however pt nearing Medicare cap and would like to try continuing on her own with her HEP at this point. If things do not continue to improve, she will request new order from MD to return to PT.    Rehab Potential  Fair    Clinical Impairments Affecting Rehab Potential  Chronicity of condition, morbid obesity, complex medical history    PT Treatment/Interventions  ADLs/Self Care Home Management;Cryotherapy;Electrical Stimulation;Iontophoresis 411mml Dexamethasone;Moist Heat;Ultrasound;Gait training;Stair training;Functional mobility training;Therapeutic activities;Therapeutic exercise;DME Instruction;Patient/family education;Manual techniques;Taping;Dry needling    Consulted and Agree with Plan of Care  Patient  Patient will benefit from skilled therapeutic intervention in order to improve the following deficits and impairments:  Abnormal gait, Decreased endurance, Decreased activity tolerance, Decreased strength, Pain, Difficulty walking, Decreased mobility, Increased muscle spasms, Improper body mechanics  Visit Diagnosis: Chronic left-sided low back pain with left-sided sciatica  Muscle weakness (generalized)  Difficulty in walking, not elsewhere classified     Problem List Patient Active Problem List   Diagnosis Date Noted  . Chronic pain 11/03/2017  . Scapholunate ligament injury, no instability 06/29/2017  . HTN (hypertension) 09/27/2016  . Dyspnea 09/27/2016  . Shingles outbreak 10/20/2015  . 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  . Hypothyroidism 07/10/2015  . OSA (obstructive sleep apnea) 07/10/2015  . Endometrial hyperplasia 07/02/2015  . Bronchitis 06/01/2015  . Cough variant asthma 11/02/2014  . Diabetes mellitus ([place])   . Obesity     PHYSICAL THERAPY DISCHARGE SUMMARY  Visits from Start of Care: 18  Current functional level related to goals / functional outcomes:   Refer to  above clinical impression.   Remaining deficits:   As above.   Education / Equipment:   HEP, Training and development officer education  Plan: Patient agrees to discharge.  Patient goals were partially met. Patient is being discharged due to                                                    reaching a plateau with therapy progression. ?????      Percival Spanish, PT, MPT 12/25/2017, 4:52 PM  [facility] [address]  [place], [place], [address] Phone: [phone]   Fax:  [phone]  Name: JOESPHINE SCHEMM MRN: [id number] Date of Birth: 24-Oct-1950

## 2018-01-03 ENCOUNTER — Encounter: Payer: Self-pay | Admitting: Family Medicine

## 2018-01-03 ENCOUNTER — Ambulatory Visit (INDEPENDENT_AMBULATORY_CARE_PROVIDER_SITE_OTHER): Payer: Medicare Other | Admitting: Family Medicine

## 2018-01-03 VITALS — BP 132/64 | HR 77 | Temp 98.3°F | Resp 16 | Ht 61.0 in | Wt 291.1 lb

## 2018-01-03 DIAGNOSIS — Z79899 Other long term (current) drug therapy: Secondary | ICD-10-CM | POA: Diagnosis not present

## 2018-01-03 DIAGNOSIS — J4550 Severe persistent asthma, uncomplicated: Secondary | ICD-10-CM

## 2018-01-03 DIAGNOSIS — G894 Chronic pain syndrome: Secondary | ICD-10-CM | POA: Diagnosis not present

## 2018-01-03 DIAGNOSIS — E119 Type 2 diabetes mellitus without complications: Secondary | ICD-10-CM

## 2018-01-03 DIAGNOSIS — M150 Primary generalized (osteo)arthritis: Secondary | ICD-10-CM | POA: Diagnosis not present

## 2018-01-03 DIAGNOSIS — M159 Polyosteoarthritis, unspecified: Secondary | ICD-10-CM

## 2018-01-03 LAB — POCT GLYCOSYLATED HEMOGLOBIN (HGB A1C): HBA1C, POC (CONTROLLED DIABETIC RANGE): 7.3 % — AB (ref 0.0–7.0)

## 2018-01-03 MED ORDER — PREDNISONE 5 MG PO TABS: ORAL_TABLET | ORAL | 0 refills | Status: DC

## 2018-01-03 MED ORDER — HYDROCODONE-ACETAMINOPHEN 5-325 MG PO TABS: ORAL_TABLET | ORAL | 0 refills | Status: DC

## 2018-01-03 NOTE — Progress Notes (Signed)
OFFICE VISIT  01/03/2018   CC:  Chief Complaint  Patient presents with  . Follow-up    RCI, pt is not fasting.    HPI:    Patient is a 67 y.o. Caucasian female who presents for f/u chronic pain syndrome, DM 2, and severe persistent asthma.  Indication for chronic opioid: Diffuse osteoarthritic pain: knees, wrists, ankles, hips, feet, low back.  This is all complicated by morbid obesity and is causing chronic debilitation/gait instability. Started vicodin relatively recently and at last f/u she voiced improvement.  We kept her med the same. Medication and dose: vicodin 5/325, 1-2 bid prn # pills per month: 60 Last UDS date: none Opioid Treatment Agreement signed (Y/N): 11/03/17 Opioid Treatment Agreement last reviewed with patient:  today [place] reviewed this encounter (include red flags):  today, no red flags.  Vicodin, which we instituted recently, has been helpful, limited some by nausea unless she takes it with no food in stomach. Helps her get to sleep in night.  Using TENS unit.  She uses this med only in nighttime at this time b/c it does make her a bit impaired.  No daytime use.  DM: Last A1c 3 mo ago was 9.4%. Increased insulin to 65 U SQ qAM, 65 U SQ mid day, and 61 U SQ q evening.  Also novolog: USE PER SLIDING SCALE AT [facility] MEAL (AVG USE IS 17 UNITS TID). She didn't bring her log book in: Rare hypoglycemia--mainly when she forgot to eat evening snack (as low as 53). Fastings avg 120s.  No checks later in the day.  Asthma:  Did well over summer.  Last 2 wks using albut more secondary to worsened hay fever.  Has used albut 2 times per day during this time and it brings relief.  Compliant with symbicort.  Taking singulair, xyzal, flonase qd.  Past Medical History:  Diagnosis Date  . Allergic rhinitis    Allergy testing: results pending as of 05/28/16 (Dr. Harold Hedge)  . Anemia   . Arthritis   . Cellulitis 5/12   Left (Since replacemnt of Left knee  . Chronic renal  insufficiency, stage II (mild) 2015  . Complex endometrial hyperplasia with atypia 12/12   Most recent endometrial bx was 10/2012-NEG  . Cough variant asthma   . DDD (degenerative disc disease), lumbar   . Diabetes mellitus ([place])   . Encounter for insertion of mirena IUD 5/12  . Fracture, foot 8/14   Hx of left foot stress fracture  . GERD (gastroesophageal reflux disease)   . Hemorrhoids   . Herpes zoster 10/2015   L side belt-line  . History of adenomatous polyp of colon 02/25/13   5 mm cecal polyp removed by Dr. Trevor Mace 5 yrs  . History of blood transfusion   . Hyperlipidemia    Not on statin b/c lipids "stayed down when sugars came down" per pt.  She says Dr. Chalmers Cater knows she is not on statin anymore.  . Hypertension   . Hypothyroidism   . Nephrolithiasis 4/07  . Obesity   . OSA on CPAP   . Osteoarthritis of both knees    Bilat TKA  . Osteoarthritis of right ankle and foot   . Osteoarthritis of right wrist    + ? scapholunate ligament disruption (x-ray 06/2017)--ortho referral.  . Sciatica of left side   . Severe persistent asthma    cough-variant--saw Allergist 05/25/16 and was switched from max dose advair to symbicort.  . Systolic murmur  ECHO fine 10/2016-->murmur likely flow murmur assoc with HTN.  Marland Kitchen UTI (lower urinary tract infection)   . Zoon's vulvitis    Bx-proven (GYN) -lichen sclerosis.  Clobetasol 0.05% ointment per GYN    Past Surgical History:  Procedure Laterality Date  . ANKLE FRACTURE SURGERY  1984   Pin & repair  . ARTHROSCOPIC REPAIR ACL  2000   Due to ACL tear  . ARTHROSCOPIC REPAIR ACL  5/08  . Blateral knee rerlacements x4 2 times each knee    . BREAST BIOPSY Right 2014   Benign  . CARDIAC CATHETERIZATION  5/07   clear vessel mild mitral   . CARPAL TUNNEL RELEASE Right [id number]   1989 Left  . CHOLECYSTECTOMY  1988  . COLONOSCOPY N/A 02/25/2013   Tubular adenoma x 1: Recall 5 yrs. Procedure: COLONOSCOPY;  Surgeon: Juanita Craver, MD;   Location: [facility];  Service: Endoscopy;  Laterality: N/A;  . COMBINED HYSTEROSCOPY DIAGNOSTIC / D&C  2/12   Bx neg  . DEXA  08/2007   Bone density normal.  . DILATION AND CURETTAGE OF UTERUS  12/11  . REPLACEMENT TOTAL KNEE Left 5/12   X 2 on each  . TRANSTHORACIC ECHOCARDIOGRAM  10/11/2016    EF 55-60%, grd I DD.  . TUBAL LIGATION  1985   C-Section    Outpatient Medications Prior to Visit  Medication Sig Dispense Refill  . albuterol (PROVENTIL HFA;VENTOLIN HFA) 108 (90 Base) MCG/ACT inhaler Inhale 2 puffs into lungs every 6 hours as needed for wheezing or shortness of breath Dx: J45.50 18 Inhaler 3  . albuterol (PROVENTIL) (2.5 MG/3ML) 0.083% nebulizer solution Take 3 mLs (2.5 mg total) by nebulization every 4 (four) hours as needed for wheezing or shortness of breath (Dx: J45.50). 75 mL 1  . aspirin 81 MG tablet Take 81 mg by mouth daily.    . clobetasol ointment (TEMOVATE) 0.05 % APPLY TO AFFECTED AREA TWICE A DAY (Patient taking differently: APPLY TO AFFECTED AREA TWICE A DAY prn) 60 g 1  . enalapril (VASOTEC) 10 MG tablet TAKE 1 TABLET BY MOUTH EVERY DAY 90 tablet 1  . fluticasone (CUTIVATE) 0.05 % cream Apply to affected area of R lower leg twice per day 30 g 2  . furosemide (LASIX) 20 MG tablet TAKE 1 TABLET BY MOUTH EVERY DAY 90 tablet 1  . IBUPROFEN PO Take by mouth as needed.     . insulin aspart (NOVOLOG) 100 UNIT/ML injection USE PER SLIDING SCALE AT EACH MEAL (AVG USE IS 17 UNITS TID) 60 mL 1  . insulin NPH Human (NOVOLIN N) 100 UNIT/ML injection 60 U SQ qAM, 60 U SQ mid day, and 59 U SQ q evening (Patient taking differently: 65 U SQ qAM, 65 U SQ mid day, and 61 U SQ q evening) 15 vial 1  . Insulin Syringes, Disposable, U-100 0.3 ML MISC Use to inject insulin--6 injections per day 540 each 3  . levocetirizine (XYZAL) 5 MG tablet Take 1 tablet daily as needed by mouth.  5  . levothyroxine (SYNTHROID, LEVOTHROID) 125 MCG tablet TAKE 1 TABLET BY MOUTH EVERY MORNING ON AN  EMPTY STOMACH 90 tablet 1  . MELATONIN PO Take by mouth.    . meloxicam (MOBIC) 15 MG tablet TAKE 1 TABLET BY MOUTH EVERY DAY AS NEEDED 30 tablet 3  . metFORMIN (GLUCOPHAGE) 500 MG tablet TAKE 1 TABLET BY MOUTH EVERY MORNING AND 2 TABLETS BY MOUTH AT BEDTIME 270 tablet 1  . montelukast (SINGULAIR) 10  MG tablet Take 1 tablet by mouth daily.    . pravastatin (PRAVACHOL) 20 MG tablet TAKE 1 TABLET BY MOUTH EVERY DAY 90 tablet 2  . SYMBICORT 160-4.5 MCG/ACT inhaler Inhale 2 puffs into the lungs 2 (two) times daily.    Marland Kitchen HYDROcodone-acetaminophen (NORCO/VICODIN) 5-325 MG tablet Take 1-2 tablets by mouth 2 (two) times daily as needed for moderate pain. 60 tablet 0  . predniSONE (DELTASONE) 10 MG tablet 2 tabs po qd x 5d, then 1 tab po qd x 5d (Patient not taking: Reported on 01/03/2018) 15 tablet 0   No facility-administered medications prior to visit.     Allergies  Allergen Reactions  . Adhesive [Tape]   . Banana     Nausea and diarrhea  . Eggs Or Egg-Derived Products     Nausea and diarrhea  . Latex     sensativity  . Penicillins Rash  . Pine Swelling and Rash  . Rose Swelling and Rash    ROS As per HPI  PE: Blood pressure 132/64, pulse 77, temperature 98.3 F (36.8 C), temperature source Oral, resp. rate 16, height 5\' 1"  (1.549 m), weight 291 lb 2 oz (132.1 kg), last menstrual period 08/10/2010, SpO2 93 %. Gen: Alert, well appearing.  Patient is oriented to person, place, time, and situation. AFFECT: pleasant, lucid thought and speech. CV: RRR, no m/r/g.   LUNGS: CTA bilat, nonlabored resps, good aeration in all lung fields.   LABS:  Lab Results  Component Value Date   TSH 1.49 02/13/2017   Lab Results  Component Value Date   WBC 8.0 10/04/2017   HGB 11.7 (L) 10/04/2017   HCT 35.2 (L) 10/04/2017   MCV 91.8 10/04/2017   PLT 251.0 10/04/2017  No results found for: IRON, TIBC, FERRITIN  Lab Results  Component Value Date   CREATININE 0.89 10/04/2017   BUN 29 (H)  10/04/2017   NA 141 10/04/2017   K 4.7 10/04/2017   CL 105 10/04/2017   CO2 28 10/04/2017   Lab Results  Component Value Date   ALT 20 02/13/2017   AST 21 02/13/2017   ALKPHOS 55 02/13/2017   BILITOT 0.4 02/13/2017   Lab Results  Component Value Date   CHOL 144 02/13/2017   Lab Results  Component Value Date   HDL 31.80 (L) 02/13/2017   Lab Results  Component Value Date   LDLCALC 75 02/13/2017   Lab Results  Component Value Date   TRIG 184.0 (H) 02/13/2017   Lab Results  Component Value Date   CHOLHDL 5 02/13/2017   Lab Results  Component Value Date   HGBA1C 7.3 (A) 01/03/2018   POC HbA1c today= 7.3%  IMPRESSION AND PLAN:  1) DM 2, control much improved. POC A1c 7.3% today. No changes.  2) Asthma: not ideal control.  Not in acute exacerbation, though. No change in controller therapy at this time. Decided to do low dose prednisone taper to try to calm things down through [place] season. Pred 20mg  qd x 3d, then 10mg  qd x 3d, then 5 mg qd x 3d.  3) Chronic pain syndrome:  Doing much better on vicodin 5/325, using this sparingly. She may choose to start using a tab in daytime for pain as well as her hs dose. I did erx's for vicodin 5/325, 1-2 po qd prn, #60 today for this month, Oct 2019, and Nov 2019.  Appropriate fill on/after date was noted on each rx.  An After Visit Summary was printed and given  to the patient.  FOLLOW UP: No follow-ups on file.  Signed:  Crissie Sickles, MD           01/03/2018

## 2018-01-04 ENCOUNTER — Encounter: Payer: Self-pay | Admitting: Family Medicine

## 2018-01-07 ENCOUNTER — Encounter: Payer: Self-pay | Admitting: Family Medicine

## 2018-01-08 DIAGNOSIS — Z23 Encounter for immunization: Secondary | ICD-10-CM | POA: Diagnosis not present

## 2018-01-19 ENCOUNTER — Telehealth: Payer: Self-pay | Admitting: Obstetrics & Gynecology

## 2018-01-19 DIAGNOSIS — N950 Postmenopausal bleeding: Secondary | ICD-10-CM

## 2018-01-19 NOTE — Telephone Encounter (Signed)
Reviewed with Dr. Sabra Heck, call returned to patient, reviewed plan of care. PUS with possible EMB scheduled for 10/17 at 2:30pm, consult at 3pm with Dr. Sabra Heck. Orders placed for precert, patient is aware she will be called prior to appt to review benefits.   Advised patient to return call to office should bleeding become heavy, pain or any new symptoms develop. ER precautions provided for weekend and after hours. Patient verbalizes understanding and is agreeable to plan.  Routing to provider for final review. Patient is agreeable to disposition. Will close encounter.  Cc: Magdalene Patricia, [address] [employer]

## 2018-01-19 NOTE — Telephone Encounter (Signed)
Patient has hyperplasia, which has been under control for a few years. She is way past menopause and has started bleeding again for the past week/week and a half. Also bled for a week in July as well. States it is not a large amount. Noticed both times it happened while she was on prednisone.

## 2018-01-19 NOTE — Telephone Encounter (Signed)
Spoke with patient. Patient reports hx of hyperplasia. Had one episode of spotting in July 2019, was taking prednisone at the time, bleeding resolved. Patient states she started prednisone again on 9/25, spotting started again 1.5 wks ago. Spotting is intermittent. Denies pain, urinary symptoms, N/V, fever/chills. Advised I will review with Dr. Sabra Heck and return call to schedule OV. Patient verbalizes understanding.  Last AEX 11/03/16 CT Abdomen and pelvis 2008 No PUS in Epic  Dr. Sabra Heck -please review and advise.

## 2018-01-22 ENCOUNTER — Telehealth: Payer: Self-pay | Admitting: Obstetrics & Gynecology

## 2018-01-22 NOTE — Telephone Encounter (Signed)
Patient returned call. Spoke with patient regarding benefit for scheduled ultrasound.  Patient understood and agreeable. Patient scheduled 01/25/18 with Dr Sabra Heck. Patient aware of appointment date, arrive time and cancellation policy. No further questions. Ok to close

## 2018-01-22 NOTE — Telephone Encounter (Signed)
Call placed to patient to review benefits for scheduled appointment for an ultrasound. Left voicemail message requesting a return call.

## 2018-01-25 ENCOUNTER — Ambulatory Visit (INDEPENDENT_AMBULATORY_CARE_PROVIDER_SITE_OTHER): Payer: Medicare Other

## 2018-01-25 ENCOUNTER — Other Ambulatory Visit: Payer: Self-pay | Admitting: Family Medicine

## 2018-01-25 ENCOUNTER — Ambulatory Visit (INDEPENDENT_AMBULATORY_CARE_PROVIDER_SITE_OTHER): Payer: Medicare Other | Admitting: Obstetrics & Gynecology

## 2018-01-25 VITALS — BP 160/70 | HR 96 | Temp 97.9°F | Resp 18

## 2018-01-25 DIAGNOSIS — N950 Postmenopausal bleeding: Secondary | ICD-10-CM | POA: Diagnosis not present

## 2018-01-25 DIAGNOSIS — C541 Malignant neoplasm of endometrium: Secondary | ICD-10-CM | POA: Diagnosis not present

## 2018-01-25 NOTE — Progress Notes (Addendum)
67 y.o. G25P2002 Married White or Caucasian female here for pelvic ultrasound due to PMP bleeding.  Pt reports this started in July with a little spotting for a few days.  She did not have any pelvic pain or cramping.  Bleeding stopped.  She was not seen at this time.  Then spotting started about two weeks ago.  She has continued to spot and only noted this with wiping until today when she did notice some red in the toilet.    Pt has hx of complex endometrial hyperplasia without atypica that was treated with Mirena IUD for 5 years.  This was removed about two years ago.  She did have follow up biopsies after placement and did have resolution of hyperplasia.    Patient's last menstrual period was 08/10/2010.  Contraception: PMP  Findings:  UTERUS: 8.5 x 5.3 x 4.5cm EMS: 18-11mm, areas of vascular flow noted as well ADNEXA: Left ovary: not seen transvaginally or transabdominally due to habitus       Right ovary: same as with left ovary CUL DE SAC: no free fluid  Discussion:  Findings reviewed with pt.  Endometrial biopsy recommended today.  Consent obtained.  Endometrial biopsy recommended.  Discussed with patient.  Verbal and written consent obtained.   Procedure:  Speculum placed.  Cervix visualized and cleansed with betadine prep.  A single toothed tenaculum was applied to the anterior lip of the cervix.  Endometrial pipelle was advanced through the cervix into the endometrial cavity without difficulty.  Pipelle passed to 10.5cm.  Suction applied and pipelle removed with good tissue sample obtained.  Tenculum removed.  No bleeding noted.  Patient tolerated procedure well. Marland Kitchen Pt aware endometrium is thickened.  Is likely going to need hysteroscopy with D&C again and then IUD placement.  Hopefully there is just hyperplasia again.  questions answered.    Assessment:  PMP bleeding since July Morbid obesity Endometrial thickening H/O complex endometrial hyperplasia without atypia Thickened  whitish lesion on left labia majora (similar lesions have been biopsied in the past)  Plan:  Biopsy results pending.  Results and recommendations will be called to pt. Consider biopsy of left labia again in OR if D&C is needed or possibly in office.    ~15 minutes spent with patient >50% of time was in face to face discussion of above in addition to obtaining endometrial biopsy.

## 2018-01-28 ENCOUNTER — Encounter: Payer: Self-pay | Admitting: Obstetrics & Gynecology

## 2018-01-30 ENCOUNTER — Telehealth: Payer: Self-pay | Admitting: Obstetrics & Gynecology

## 2018-01-30 NOTE — Telephone Encounter (Signed)
Dr. Benn Moulder?) calling for Dr. Sabra Heck regarding patient's biopsy results. Can be reached at [phone].

## 2018-01-31 NOTE — Telephone Encounter (Signed)
Incoming call from Dr. Lanae Crumbly,  Results of pathology taken verbally so that pathology can be released to Dr. Sabra Heck.  Confirmed and will ensure Dr. Sabra Heck is given results.

## 2018-01-31 NOTE — Telephone Encounter (Signed)
Spoke with Dr. Sabra Heck via phone and results relayed via phone. No further orders received at this time.

## 2018-02-05 ENCOUNTER — Telehealth: Payer: Self-pay | Admitting: Family Medicine

## 2018-02-05 ENCOUNTER — Encounter: Payer: Self-pay | Admitting: Family Medicine

## 2018-02-05 MED ORDER — INSULIN SYRINGES (DISPOSABLE) U-100 0.3 ML MISC: 3 refills | Status: AC

## 2018-02-05 NOTE — Telephone Encounter (Signed)
Copied from [place] [id number]. Topic: Quick Communication - Rx Refill/Question >> Feb 05, 2018 11:43 AM Judyann Munson wrote: Medication: HYDROcodone-acetaminophen (NORCO/VICODIN) 5-325 MG tablet, Insulin Syringes, Disposable, U-100 0.3 ML MISC Has the patient contacted their pharmacy? Yes  Preferred Pharmacy (with phone number or street name): [id number] [address], [place] [phone] (Phone) [phone] (Fax)    Agent: Please be advised that RX refills may take up to 3 business days. We ask that you follow-up with your pharmacy.

## 2018-02-05 NOTE — Progress Notes (Addendum)
Consult Note: New Patient FIRST VISIT   Consult was requested by Dr. Hale Bogus for Grade 1 Endometrial Adenocarcinoma   Chief Complaint  Patient presents with  . Endometrial adenocarcinoma [facility])    GYN Oncologic Summary 1. TBD o .  HPI: [name]  is a nice 67 y.o.  P2, - a retired Marine scientist  She had a h/o complex hyperplasia without atypia and was treated with a Mirena IUD x 5 years. ~2017 this was removed and she was noted to have followup biopsies with no residual hyperplasia. Then in July 2019 she had spotting for a few days. This resolved and then she noted another episode early Oct 2019. She presented to Dr. Sabra Heck and a TVUS was perfomed showing a thickened EM stripe with an area showing vascular flow. Therefore an EMB was performed and that revealed: Endometrium, biopsy - ENDOMETRIOID ADENOCARCINOMA, FIGO GRADE 1. - SEE COMMENT. Microscopic Comment Internal departmental review obtained (Dr. Lyndon Code) with agreement. Results are phoned to [facility] in the office of Dr. Edwinna Areola. (MEG:ah 01/31/18) [name] MD  She was therefore referred for management and recommendations  NOTABLE is her obesity. She states since losing her son, who died at 23 years old as complications from his Tetrology of Fallot, she has been overweight.   Adhesive allergy reviewed - She has done OK with steristrips after knee surgery PCN allergy reviewed - she has used Keflex without issue  States last HgbA1C was 7.3 12/2017  Imported EPIC Oncologic History:   No history exists.    Measurement of disease: . TBD  Radiology: . 01/25/18 - TVUS  OSH - UTERUS: 8.5 x 5.3 x 4.5cm EMS: 18-51mm, areas of vascular flow noted as well ADNEXA: Left ovary: not seen transvaginally or transabdominally due to habitus Right ovary: same as with left ovary CUL DE SAC: no free fluid    Outpatient Encounter Medications as of 02/09/2018  Medication Sig  . albuterol (PROVENTIL HFA;VENTOLIN HFA) 108 (90  Base) MCG/ACT inhaler Inhale 2 puffs into lungs every 6 hours as needed for wheezing or shortness of breath Dx: J45.50  . albuterol (PROVENTIL) (2.5 MG/3ML) 0.083% nebulizer solution Take 3 mLs (2.5 mg total) by nebulization every 4 (four) hours as needed for wheezing or shortness of breath (Dx: J45.50).  Marland Kitchen aspirin 81 MG tablet Take 81 mg by mouth daily.  . clobetasol ointment (TEMOVATE) 0.05 % APPLY TO AFFECTED AREA TWICE A DAY (Patient taking differently: as needed. )  . enalapril (VASOTEC) 10 MG tablet TAKE 1 TABLET BY MOUTH EVERY DAY  . fluticasone (CUTIVATE) 0.05 % cream Apply to affected area of R lower leg twice per day (Patient taking differently: as needed. Apply to affected area of R lower leg twice per day)  . furosemide (LASIX) 20 MG tablet TAKE 1 TABLET BY MOUTH EVERY DAY  . HYDROcodone-acetaminophen (NORCO/VICODIN) 5-325 MG tablet 1-2 tabs po qd prn pain  . IBUPROFEN PO Take by mouth as needed.   . insulin aspart (NOVOLOG) 100 UNIT/ML injection USE PER SLIDING SCALE AT [facility] MEAL (AVG USE IS 17 UNITS TID)  . insulin NPH Human (NOVOLIN N) 100 UNIT/ML injection 65 U SQ qAM, 65 U SQ mid day, and 61 U SQ q evening  . Insulin Syringes, Disposable, U-100 0.3 ML MISC Use to inject insulin--6 injections per day  . levocetirizine (XYZAL) 5 MG tablet Take 1 tablet daily as needed by mouth.  . levothyroxine (SYNTHROID, LEVOTHROID) 125 MCG tablet TAKE 1 TABLET BY  MOUTH EVERY MORNING ON AN EMPTY STOMACH  . MELATONIN PO Take by mouth as needed.   . meloxicam (MOBIC) 15 MG tablet TAKE 1 TABLET BY MOUTH EVERY DAY AS NEEDED  . metFORMIN (GLUCOPHAGE) 500 MG tablet TAKE 1 TABLET BY MOUTH EVERY MORNING AND 2 TABLETS BY MOUTH AT BEDTIME  . montelukast (SINGULAIR) 10 MG tablet Take 1 tablet by mouth daily.  . pravastatin (PRAVACHOL) 20 MG tablet TAKE 1 TABLET BY MOUTH EVERY DAY  . SYMBICORT 160-4.5 MCG/ACT inhaler Inhale 2 puffs into the lungs 2 (two) times daily.  . [DISCONTINUED] Insulin Syringes,  Disposable, U-100 0.3 ML MISC Use to inject insulin--6 injections per day  . [DISCONTINUED] predniSONE (DELTASONE) 5 MG tablet 4 tabs po qd x 3d, then 2 tabs po qd x 3d, then 1 tab po qd x 3d. (Patient not taking: Reported on 02/09/2018)   No facility-administered encounter medications on file as of 02/09/2018.    Allergies  Allergen Reactions  . Adhesive [Tape] Other (See Comments)    Takes patient's skin off.  . Banana     Nausea and diarrhea  . Eggs Or Egg-Derived Products     Nausea and diarrhea  . Latex     sensativity  . Penicillins Rash    Has had cephalosporins without incident  . Pine Swelling and Rash  . Rose Swelling and Rash    Past Medical History:  Diagnosis Date  . Allergic rhinitis    Allergy testing: results pending as of 05/28/16 (Dr. Harold Hedge)  . Anemia   . Arthritis   . Complex endometrial hyperplasia with atypia 12/12   Most recent endometrial bx was 10/2012-NEG  . Cough variant asthma   . DDD (degenerative disc disease), lumbar   . Diabetes mellitus with complication ([place])    Mild nonprolif DR R eye 12/2017-->referred to retinal specialist  . Encounter for insertion of mirena IUD 5/12  . Fracture, foot 8/14   Hx of left foot stress fracture  . GERD (gastroesophageal reflux disease)   . Hemorrhoids   . Herpes zoster 10/2015   L side belt-line  . History of adenomatous polyp of colon 02/25/13   5 mm cecal polyp removed by Dr. Trevor Mace 5 yrs  . History of blood transfusion   . History of cellulitis 08/2010   Left (Since replacemnt of Left knee  . Hyperlipidemia    Not on statin b/c lipids "stayed down when sugars came down" per pt.  She says Dr. Chalmers Cater knows she is not on statin anymore.  . Hypertension   . Hypothyroidism   . Nephrolithiasis 4/07  . Obesity   . OSA on CPAP   . Osteoarthritis    knees, ankle, + ? scapholunate ligament disruption (x-ray 06/2017)--ortho referral.  . Sciatica of left side   . Severe persistent asthma     cough-variant--saw Allergist 05/25/16 and was switched from max dose advair to symbicort.  . Systolic murmur    ECHO fine 10/2016-->murmur likely flow murmur assoc with HTN.  Marland Kitchen UTI (lower urinary tract infection)   . Zoon's vulvitis    Bx-proven (GYN) -lichen sclerosis.  Clobetasol 0.05% ointment per GYN   Past Surgical History:  Procedure Laterality Date  . ANKLE FRACTURE SURGERY  1984   Pin & repair  . ARTHROSCOPIC REPAIR ACL  2000   Due to ACL tear  . ARTHROSCOPIC REPAIR ACL  5/08  . Blateral knee rerlacements x4 2 times each knee    . BREAST BIOPSY Right  2014   Benign  . CARDIAC CATHETERIZATION  5/07   clear vessel mild mitral   . CARPAL TUNNEL RELEASE Right [id number]   1989 Left  . CESAREAN SECTION  1985  . CHOLECYSTECTOMY OPEN  1988  . COLONOSCOPY N/A 02/25/2013   Tubular adenoma x 1: Recall 5 yrs. Procedure: COLONOSCOPY;  Surgeon: Juanita Craver, MD;  Location: [facility];  Service: Endoscopy;  Laterality: N/A;  . COMBINED HYSTEROSCOPY DIAGNOSTIC / D&C  2/12   Bx neg  . DEXA  08/2007   Bone density normal.  . DILATION AND CURETTAGE OF UTERUS  12/11  . REPLACEMENT TOTAL KNEE Left 5/12   X 2 on each  . TRANSTHORACIC ECHOCARDIOGRAM  10/11/2016    EF 55-60%, grd I DD.  . TUBAL LIGATION  1985   C-Section        Past Gynecological History:   GYNECOLOGIC HISTORY:  . Patient's last menstrual period was 08/10/2010.  . Menarche: 67 years old . P 2 . Contraceptive h/o Mirena IUD . HRT None  . Last Pap 10/2016 negative Family Hx:  Family History  Problem Relation Age of Onset  . Diabetes Father   . COPD Father   . Stroke Father   . Celiac disease Brother   . Rheum arthritis Brother   . Thyroid disease Brother   . Diabetes Brother   . Alzheimer's disease Mother   . Arthritis Mother   . Other Son        Died age 59 -Tetrology of Fallot - VSD/pulmonary atresia  . Breast cancer Other        postmenopausal when diagnosed   Social Hx:  Marland Kitchen Tobacco use: none . Alcohol  use: none . Illicit Drug use: none . Illicit IV Drug use: none    Review of Systems: Review of Systems  Genitourinary: Positive for vaginal bleeding and vaginal discharge.   Musculoskeletal: Positive for arthralgias.  All other systems reviewed and are negative.   Vitals:  Vitals:   02/09/18 1113  BP: (!) 145/50  Pulse: 63  Resp: 20  Temp: (!) 97.5 F (36.4 C)  SpO2: 99%   Vitals:   02/09/18 1113  Weight: 292 lb 3.2 oz (132.5 kg)  Height: 5\' 1"  (1.549 m)   Body mass index is 55.21 kg/m.  Physical Exam: General :  Obese. Well developed, 67 y.o., female in no apparent distress HEENT:  Normocephalic/atraumatic, symmetric, EOMI, eyelids normal Neck:   Supple, no masses.  Lymphatics:  No cervical/ submandibular/ supraclavicular/ infraclavicular/ inguinal adenopathy Respiratory:  Respirations unlabored, no use of accessory muscles CV:   Deferred Breast:  Deferred Musculoskeletal: No CVA tenderness, normal muscle strength. Abdomen:  Obese with pannus and prior surgical scars. Soft, non-tender and nondistended. No evidence of hernia. No masses. Extremities:  No lymphedema, no erythema, non-tender. Skin:   Normal inspection; candida at mons/groin Neuro/Psych:  No focal motor deficit, no abnormal mental status. Normal gait. Normal affect. Alert and oriented to person, place, and time  Genito Urinary: Vulva: Obese, with mons/groin signs of candida. Normal external female genitalia.  Bladder/urethra: Urethral meatus normal in size and location. No lesions or   masses, well supported bladder Speculum exam: Vagina: No lesion, no discharge, no bleeding. Cervix: Normal appearing, no lesions. Bimanual exam:  Uterus: Habitus prevents delineation of size. Mobile.  Adnexal region: No masses. Rectovaginal:  Good tone, no masses, no cul de sac nodularity, no parametrial involvement or nodularity.   Assessment  Endometrial Cancer  Plan  1. Complexity  of visit ? This is a new  problem and additional workup/management is planned ? Data reviewed ? I reviewed the ultrasound report accompanying the patient noting her uterus of normal size. Adnexa not seen ? I reviewed her referring doctor's office notes and I have summarized in the HPI ? History was obtained from the patient and the chart ? We reviewed her pathology report and her lab (HgbA1C) ? This is an acute illness that may pose a threat to life if left untreated ? Management is going to include recommendation for major surgery ? Risk factors identified include diabetic, use of preoperative Meloxicam (which I have asked her to hold), Class 3 obesity, and limited mobility due to OA of the knees 2. Counseling for uterine cancer ? We discussed the diagnosis of uterine cancer the typical presentation and prognosis ? We reviewed the difference between grade and stage ? Standard of care hysterectomy versus alternatives of hormones and radiation were reviewed ? We discussed the possibility of additional treatments following surgery, such as radiation and/or chemotherapy 3. Surgical management ? Patient would like to proceed with standard of care management i.e. Hysterectomy ? Surgical sketch was reviewed including the risks, benefits, and alternatives.  She was given a copy of this today and one will be placed in the chart. ? Plan will be for robotic assisted laparoscopic hysterectomy, BSO, sentinel lymph node biopsy, washings, and possible full staging ? She understands if she is unable to tolerate Trendelenburg position we may need to abort and at that point we will recommend MRI/IUD and close followup. ? We offered to send her back to Dr. Sabra Heck for IUD if she/Dr. Sabra Heck wish, but we would follow closely 4. We talked about weight loss today a bit and lifestyle challenges. ? Suggestion was made to consider bariatric surgery at some point, but especially if the case ends up aborted. 5. Preoperative items will include   ? Obtaining a chest x-ray 6. The patient, her husband, and daughter were present for the discussion with no further questions after review of the above 7. I will plan to have her return in 10 to 14 days postop to review the pathology and discuss any further management  Face to face time with patient was 60 minutes. Over 50% of this time was spent on counseling and coordination of care.   Mart Piggs, MD Gynecologic Oncologist 02/11/2018, 12:41 PM    Cc: M. Edwinna Areola, MD (Referring Ob/Gyn) Anitra Lauth Adrian Blackwater, MD (PCP)

## 2018-02-05 NOTE — H&P (View-Only) (Signed)
[name] at [facility]   Consult Note: New Patient FIRST VISIT   Consult was requested by Dr. Hale Bogus for Grade 1 Endometrial Adenocarcinoma   Chief Complaint  Patient presents with  . Endometrial adenocarcinoma [facility])    GYN Oncologic Summary 1. TBD o .  HPI: [name]  is a nice 67 y.o.  P2, - a retired Marine scientist  She had a h/o complex hyperplasia without atypia and was treated with a Mirena IUD x 5 years. ~2017 this was removed and she was noted to have followup biopsies with no residual hyperplasia. Then in July 2019 she had spotting for a few days. This resolved and then she noted another episode early Oct 2019. She presented to Dr. Sabra Heck and a TVUS was perfomed showing a thickened EM stripe with an area showing vascular flow. Therefore an EMB was performed and that revealed: Endometrium, biopsy - ENDOMETRIOID ADENOCARCINOMA, FIGO GRADE 1. - SEE COMMENT. Microscopic Comment Internal departmental review obtained (Dr. Lyndon Code) with agreement. Results are phoned to [facility] in the office of Dr. Edwinna Areola. (MEG:ah 01/31/18) [name] MD  She was therefore referred for management and recommendations  NOTABLE is her obesity. She states since losing her son, who died at 56 years old as complications from his Tetrology of Fallot, she has been overweight.   Adhesive allergy reviewed - She has done OK with steristrips after knee surgery PCN allergy reviewed - she has used Keflex without issue  States last HgbA1C was 7.3 12/2017  Imported EPIC Oncologic History:   No history exists.    Measurement of disease: . TBD  Radiology: . 01/25/18 - TVUS  OSH - UTERUS: 8.5 x 5.3 x 4.5cm EMS: 18-13mm, areas of vascular flow noted as well ADNEXA: Left ovary: not seen transvaginally or transabdominally due to habitus Right ovary: same as with left ovary CUL DE SAC: no free fluid    Outpatient Encounter Medications as of 02/09/2018   Medication Sig  . albuterol (PROVENTIL HFA;VENTOLIN HFA) 108 (90 Base) MCG/ACT inhaler Inhale 2 puffs into lungs every 6 hours as needed for wheezing or shortness of breath Dx: J45.50  . albuterol (PROVENTIL) (2.5 MG/3ML) 0.083% nebulizer solution Take 3 mLs (2.5 mg total) by nebulization every 4 (four) hours as needed for wheezing or shortness of breath (Dx: J45.50).  Marland Kitchen aspirin 81 MG tablet Take 81 mg by mouth daily.  . clobetasol ointment (TEMOVATE) 0.05 % APPLY TO AFFECTED AREA TWICE A DAY (Patient taking differently: as needed. )  . enalapril (VASOTEC) 10 MG tablet TAKE 1 TABLET BY MOUTH EVERY DAY  . fluticasone (CUTIVATE) 0.05 % cream Apply to affected area of R lower leg twice per day (Patient taking differently: as needed. Apply to affected area of R lower leg twice per day)  . furosemide (LASIX) 20 MG tablet TAKE 1 TABLET BY MOUTH EVERY DAY  . HYDROcodone-acetaminophen (NORCO/VICODIN) 5-325 MG tablet 1-2 tabs po qd prn pain  . IBUPROFEN PO Take by mouth as needed.   . insulin aspart (NOVOLOG) 100 UNIT/ML injection USE PER SLIDING SCALE AT [facility] MEAL (AVG USE IS 17 UNITS TID)  . insulin NPH Human (NOVOLIN N) 100 UNIT/ML injection 65 U SQ qAM, 65 U SQ mid day, and 61 U SQ q evening  . Insulin Syringes, Disposable, U-100 0.3 ML MISC Use to inject insulin--6 injections per day  . levocetirizine (XYZAL) 5 MG tablet Take 1 tablet daily as needed by mouth.  Marland Kitchen  levothyroxine (SYNTHROID, LEVOTHROID) 125 MCG tablet TAKE 1 TABLET BY MOUTH EVERY MORNING ON AN EMPTY STOMACH  . MELATONIN PO Take by mouth as needed.   . meloxicam (MOBIC) 15 MG tablet TAKE 1 TABLET BY MOUTH EVERY DAY AS NEEDED  . metFORMIN (GLUCOPHAGE) 500 MG tablet TAKE 1 TABLET BY MOUTH EVERY MORNING AND 2 TABLETS BY MOUTH AT BEDTIME  . montelukast (SINGULAIR) 10 MG tablet Take 1 tablet by mouth daily.  . pravastatin (PRAVACHOL) 20 MG tablet TAKE 1 TABLET BY MOUTH EVERY DAY  . SYMBICORT 160-4.5 MCG/ACT inhaler Inhale 2 puffs into the  lungs 2 (two) times daily.  . [DISCONTINUED] Insulin Syringes, Disposable, U-100 0.3 ML MISC Use to inject insulin--6 injections per day  . [DISCONTINUED] predniSONE (DELTASONE) 5 MG tablet 4 tabs po qd x 3d, then 2 tabs po qd x 3d, then 1 tab po qd x 3d. (Patient not taking: Reported on 02/09/2018)   No facility-administered encounter medications on file as of 02/09/2018.    Allergies  Allergen Reactions  . Adhesive [Tape] Other (See Comments)    Takes patient's skin off.  . Banana     Nausea and diarrhea  . Eggs Or Egg-Derived Products     Nausea and diarrhea  . Latex     sensativity  . Penicillins Rash    Has had cephalosporins without incident  . Pine Swelling and Rash  . Rose Swelling and Rash    Past Medical History:  Diagnosis Date  . Allergic rhinitis    Allergy testing: results pending as of 05/28/16 (Dr. Harold Hedge)  . Anemia   . Arthritis   . Complex endometrial hyperplasia with atypia 12/12   Most recent endometrial bx was 10/2012-NEG  . Cough variant asthma   . DDD (degenerative disc disease), lumbar   . Diabetes mellitus with complication ([place])    Mild nonprolif DR R eye 12/2017-->referred to retinal specialist  . Encounter for insertion of mirena IUD 5/12  . Fracture, foot 8/14   Hx of left foot stress fracture  . GERD (gastroesophageal reflux disease)   . Hemorrhoids   . Herpes zoster 10/2015   L side belt-line  . History of adenomatous polyp of colon 02/25/13   5 mm cecal polyp removed by Dr. Trevor Mace 5 yrs  . History of blood transfusion   . History of cellulitis 08/2010   Left (Since replacemnt of Left knee  . Hyperlipidemia    Not on statin b/c lipids "stayed down when sugars came down" per pt.  She says Dr. Chalmers Cater knows she is not on statin anymore.  . Hypertension   . Hypothyroidism   . Nephrolithiasis 4/07  . Obesity   . OSA on CPAP   . Osteoarthritis    knees, ankle, + ? scapholunate ligament disruption (x-ray 06/2017)--ortho referral.  .  Sciatica of left side   . Severe persistent asthma    cough-variant--saw Allergist 05/25/16 and was switched from max dose advair to symbicort.  . Systolic murmur    ECHO fine 10/2016-->murmur likely flow murmur assoc with HTN.  Marland Kitchen UTI (lower urinary tract infection)   . Zoon's vulvitis    Bx-proven (GYN) -lichen sclerosis.  Clobetasol 0.05% ointment per GYN   Past Surgical History:  Procedure Laterality Date  . ANKLE FRACTURE SURGERY  1984   Pin & repair  . ARTHROSCOPIC REPAIR ACL  2000   Due to ACL tear  . ARTHROSCOPIC REPAIR ACL  5/08  . Blateral knee rerlacements x4 2  times each knee    . BREAST BIOPSY Right 2014   Benign  . CARDIAC CATHETERIZATION  5/07   clear vessel mild mitral   . CARPAL TUNNEL RELEASE Right [id number]   1989 Left  . CESAREAN SECTION  1985  . CHOLECYSTECTOMY OPEN  1988  . COLONOSCOPY N/A 02/25/2013   Tubular adenoma x 1: Recall 5 yrs. Procedure: COLONOSCOPY;  Surgeon: Juanita Craver, MD;  Location: [facility];  Service: Endoscopy;  Laterality: N/A;  . COMBINED HYSTEROSCOPY DIAGNOSTIC / D&C  2/12   Bx neg  . DEXA  08/2007   Bone density normal.  . DILATION AND CURETTAGE OF UTERUS  12/11  . REPLACEMENT TOTAL KNEE Left 5/12   X 2 on each  . TRANSTHORACIC ECHOCARDIOGRAM  10/11/2016    EF 55-60%, grd I DD.  . TUBAL LIGATION  1985   C-Section        Past Gynecological History:   GYNECOLOGIC HISTORY:  . Patient's last menstrual period was 08/10/2010.  . Menarche: 67 years old . P 2 . Contraceptive h/o Mirena IUD . HRT None  . Last Pap 10/2016 negative Family Hx:  Family History  Problem Relation Age of Onset  . Diabetes Father   . COPD Father   . Stroke Father   . Celiac disease Brother   . Rheum arthritis Brother   . Thyroid disease Brother   . Diabetes Brother   . Alzheimer's disease Mother   . Arthritis Mother   . Other Son        Died age 28 -Tetrology of Fallot - VSD/pulmonary atresia  . Breast cancer Other        postmenopausal when  diagnosed   Social Hx:  Marland Kitchen Tobacco use: none . Alcohol use: none . Illicit Drug use: none . Illicit IV Drug use: none    Review of Systems: Review of Systems  Genitourinary: Positive for vaginal bleeding and vaginal discharge.   Musculoskeletal: Positive for arthralgias.  All other systems reviewed and are negative.   Vitals:  Vitals:   02/09/18 1113  BP: (!) 145/50  Pulse: 63  Resp: 20  Temp: (!) 97.5 F (36.4 C)  SpO2: 99%   Vitals:   02/09/18 1113  Weight: 292 lb 3.2 oz (132.5 kg)  Height: 5\' 1"  (1.549 m)   Body mass index is 55.21 kg/m.  Physical Exam: General :  Obese. Well developed, 67 y.o., female in no apparent distress HEENT:  Normocephalic/atraumatic, symmetric, EOMI, eyelids normal Neck:   Supple, no masses.  Lymphatics:  No cervical/ submandibular/ supraclavicular/ infraclavicular/ inguinal adenopathy Respiratory:  Respirations unlabored, no use of accessory muscles CV:   Deferred Breast:  Deferred Musculoskeletal: No CVA tenderness, normal muscle strength. Abdomen:  Obese with pannus and prior surgical scars. Soft, non-tender and nondistended. No evidence of hernia. No masses. Extremities:  No lymphedema, no erythema, non-tender. Skin:   Normal inspection; candida at mons/groin Neuro/Psych:  No focal motor deficit, no abnormal mental status. Normal gait. Normal affect. Alert and oriented to person, place, and time  Genito Urinary: Vulva: Obese, with mons/groin signs of candida. Normal external female genitalia.  Bladder/urethra: Urethral meatus normal in size and location. No lesions or   masses, well supported bladder Speculum exam: Vagina: No lesion, no discharge, no bleeding. Cervix: Normal appearing, no lesions. Bimanual exam:  Uterus: Habitus prevents delineation of size. Mobile.  Adnexal region: No masses. Rectovaginal:  Good tone, no masses, no cul de sac nodularity, no parametrial involvement or nodularity.  Assessment  Endometrial  Cancer  Plan  1. Complexity of visit ? This is a new problem and additional workup/management is planned ? Data reviewed ? I reviewed the ultrasound report accompanying the patient noting her uterus of normal size. Adnexa not seen ? I reviewed her referring doctor's office notes and I have summarized in the HPI ? History was obtained from the patient and the chart ? We reviewed her pathology report and her lab (HgbA1C) ? This is an acute illness that may pose a threat to life if left untreated ? Management is going to include recommendation for major surgery ? Risk factors identified include diabetic, use of preoperative Meloxicam (which I have asked her to hold), Class 3 obesity, and limited mobility due to OA of the knees 2. Counseling for uterine cancer ? We discussed the diagnosis of uterine cancer the typical presentation and prognosis ? We reviewed the difference between grade and stage ? Standard of care hysterectomy versus alternatives of hormones and radiation were reviewed ? We discussed the possibility of additional treatments following surgery, such as radiation and/or chemotherapy 3. Surgical management ? Patient would like to proceed with standard of care management i.e. Hysterectomy ? Surgical sketch was reviewed including the risks, benefits, and alternatives.  She was given a copy of this today and one will be placed in the chart. ? Plan will be for robotic assisted laparoscopic hysterectomy, BSO, sentinel lymph node biopsy, washings, and possible full staging ? She understands if she is unable to tolerate Trendelenburg position we may need to abort and at that point we will recommend MRI/IUD and close followup. ? We offered to send her back to Dr. Sabra Heck for IUD if she/Dr. Sabra Heck wish, but we would follow closely 4. We talked about weight loss today a bit and lifestyle challenges. ? Suggestion was made to consider bariatric surgery at some point, but especially if the case  ends up aborted. 5. Preoperative items will include  ? Obtaining a chest x-ray 6. The patient, her husband, and daughter were present for the discussion with no further questions after review of the above 7. I will plan to have her return in 10 to 14 days postop to review the pathology and discuss any further management  Face to face time with patient was 60 minutes. Over 50% of this time was spent on counseling and coordination of care.   Mart Piggs, MD Gynecologic Oncologist 02/11/2018, 12:41 PM    Cc: M. Edwinna Areola, MD (Referring Ob/Gyn) Anitra Lauth Adrian Blackwater, MD (PCP)

## 2018-02-05 NOTE — Telephone Encounter (Signed)
3 Rx's were sent to pts pharmacy on 01/03/18. [facility] at [facility], they have pts Rx's on file. Pt needs to contact pharmacy.  Pt advised and voiced understanding.

## 2018-02-08 ENCOUNTER — Telehealth: Payer: Self-pay | Admitting: Obstetrics & Gynecology

## 2018-02-08 NOTE — Telephone Encounter (Signed)
Spoke with patient. She had questions about her appointment tomorrow with Dr. Gerarda Fraction.  She wanted to know where she potentially may have surgery, advised typically [facility]. She wanted to know if Dr. Sabra Heck would be performing surgery, I advised that Dr. Gerarda Fraction would perform surgery or make recommendations but that after treatment she can be seen by Dr. Sabra Heck once cleared by Dr. Gerarda Fraction.  Advised patient to write down all questions and call back if any questions after tomorrow's appointment. Pt agreeable.  Encounter to Dr. Sabra Heck and will close.

## 2018-02-08 NOTE — Telephone Encounter (Signed)
Patient has questions regarding her endometrial cancer diagnosis.

## 2018-02-09 ENCOUNTER — Inpatient Hospital Stay: Payer: Medicare Other | Attending: Obstetrics | Admitting: Obstetrics

## 2018-02-09 ENCOUNTER — Encounter: Payer: Self-pay | Admitting: Obstetrics

## 2018-02-09 VITALS — BP 145/50 | HR 63 | Temp 97.5°F | Resp 20 | Ht 61.0 in | Wt 292.2 lb

## 2018-02-09 DIAGNOSIS — Z90722 Acquired absence of ovaries, bilateral: Secondary | ICD-10-CM | POA: Insufficient documentation

## 2018-02-09 DIAGNOSIS — C541 Malignant neoplasm of endometrium: Secondary | ICD-10-CM

## 2018-02-09 DIAGNOSIS — Z90710 Acquired absence of both cervix and uterus: Secondary | ICD-10-CM | POA: Diagnosis not present

## 2018-02-09 DIAGNOSIS — T8131XA Disruption of external operation (surgical) wound, not elsewhere classified, initial encounter: Secondary | ICD-10-CM | POA: Insufficient documentation

## 2018-02-09 HISTORY — DX: Malignant neoplasm of endometrium: C54.1

## 2018-02-09 NOTE — Patient Instructions (Signed)
Preparing for your Surgery  Plan for surgery on February 19, 2018 with Dr. Everitt Amber with Dr. Gerarda Fraction assisting at the [facility] will be scheduled for a robotic assisted total hysterectomy, bilateral salpingo-oophorectomy, sentinel lymph node biopsy, possible full staging.  Pre-operative Testing -You will receive a phone call from presurgical testing at [facility] to arrange for a pre-operative testing appointment before your surgery.  This appointment normally occurs one to two weeks before your scheduled surgery.   -Bring your insurance card, copy of an advanced directive if applicable, medication list  -At that visit, you will be asked to sign a consent for a possible blood transfusion in case a transfusion becomes necessary during surgery.  The need for a blood transfusion is rare but having consent is a necessary part of your care.     -You should not be taking blood thinners or aspirin at least ten days prior to surgery unless instructed by your surgeon.  Day Before Surgery at [place] will be asked to take in a light diet the day before surgery.  Avoid carbonated beverages.  You will be advised to have nothing to eat or drink after midnight the evening before.    Eat a light diet the day before surgery.  Examples including soups, broths, toast, yogurt, mashed potatoes.  Things to avoid include carbonated beverages (fizzy beverages), raw fruits and raw vegetables, or beans.   If your bowels are filled with gas, your surgeon will have difficulty visualizing your pelvic organs which increases your surgical risks.  Your role in recovery Your role is to become active as soon as directed by your doctor, while still giving yourself time to heal.  Rest when you feel tired. You will be asked to do the following in order to speed your recovery:  - Cough and breathe deeply. This helps toclear and expand your lungs and can prevent pneumonia. You may be  given a spirometer to practice deep breathing. A staff member will show you how to use the spirometer. - Do mild physical activity. Walking or moving your legs help your circulation and body functions return to normal. A staff member will help you when you try to walk and will provide you with simple exercises. Do not try to get up or walk alone the first time. - Actively manage your pain. Managing your pain lets you move in comfort. We will ask you to rate your pain on a scale of zero to 10. It is your responsibility to tell your doctor or nurse where and how much you hurt so your pain can be treated.  Special Considerations -If you are diabetic, you may be placed on insulin after surgery to have closer control over your blood sugars to promote healing and recovery.  This does not mean that you will be discharged on insulin.  If applicable, your oral antidiabetics will be resumed when you are tolerating a solid diet.   Blood Transfusion Information WHAT IS A BLOOD TRANSFUSION? A transfusion is the replacement of blood or some of its parts. Blood is made up of multiple cells which provide different functions.  Red blood cells carry oxygen and are used for blood loss replacement.  White blood cells fight against infection.  Platelets control bleeding.  Plasma helps clot blood.  Other blood products are available for specialized needs, such as hemophilia or other clotting disorders. BEFORE THE TRANSFUSION  Who gives blood for transfusions?   You may be able  to donate blood to be used at a later date on yourself (autologous donation).  Relatives can be asked to donate blood. This is generally not any safer than if you have received blood from a stranger. The same precautions are taken to ensure safety when a relative's blood is donated.  Healthy volunteers who are fully evaluated to make sure their blood is safe. This is blood bank blood. Transfusion therapy is the safest it has ever been  in the practice of medicine. Before blood is taken from a donor, a complete history is taken to make sure that person has no history of diseases nor engages in risky social behavior (examples are intravenous drug use or sexual activity with multiple partners). The donor's travel history is screened to minimize risk of transmitting infections, such as malaria. The donated blood is tested for signs of infectious diseases, such as HIV and hepatitis. The blood is then tested to be sure it is compatible with you in order to minimize the chance of a transfusion reaction. If you or a relative donates blood, this is often done in anticipation of surgery and is not appropriate for emergency situations. It takes many days to process the donated blood. RISKS AND COMPLICATIONS Although transfusion therapy is very safe and saves many lives, the main dangers of transfusion include:   Getting an infectious disease.  Developing a transfusion reaction. This is an allergic reaction to something in the blood you were given. Every precaution is taken to prevent this. The decision to have a blood transfusion has been considered carefully by your caregiver before blood is given. Blood is not given unless the benefits outweigh the risks.

## 2018-02-11 ENCOUNTER — Encounter: Payer: Self-pay | Admitting: Obstetrics

## 2018-02-12 NOTE — Progress Notes (Signed)
ECHO 11-07-16 Epic

## 2018-02-12 NOTE — Patient Instructions (Signed)
[name]  02/12/2018   Your procedure is scheduled on: 02-19-18   Report to [facility] Main  Entrance    Report to admitting at 5:30AM    Call this number if you have problems the morning of surgery [phone]    PLEASE BRING CPAP MASK AND  TUBING ONLY. DEVICE WILL BE PROVIDED!   Remember: Eat a light diet the day before surgery. Examples including soups, broths, toast, yogurt, mashed potatoes. Things to avoid include carbonated beverages (fizzy beverages), raw fruits and raw vegetables, or beans. If your bowels are filled with gas, your surgeon will have difficulty visualizing your pelvic organs which increases your surgical risks.  Do not eat food or drink liquids :After Midnight.   BRUSH YOUR TEETH MORNING OF SURGERY AND RINSE YOUR MOUTH OUT, NO CHEWING GUM CANDY OR MINTS.     Take these medicines the morning of surgery with A SIP OF WATER: LEVOTHYROXINE, PRAVASTATIN, SYMBICORT INHALER, ALBUTEROL INHALER AND NEBULIZER IF NEEDED    DIABETIC MEDICATIONS THE DAY BEFORE SURGERY- DO NOT TAKE DINNER DOSE OF NOVOLOG INSULIN           ONLY TAKE HALF DOSE OF NOVOLIN (N) INSULIN;            TAKE METFORMIN AS NORMAL   THE DAY OF SURGERY - IF YOUR BLOOD SUGAR IS GREATER THAN 220, YOU MAY TAKE HALF YOUR DOSE OF NOVOLOG INSULIN     TAKE HALF YOUR DOSE OF NOVOLIN (N) INSULIN      DO NOT TAKE METFORMIN                                  You may not have any metal on your body including hair pins and              piercings  Do not wear jewelry, make-up, lotions, powders or perfumes, deodorant             Do not wear nail polish.  Do not shave  48 hours prior to surgery.     Do not bring valuables to the hospital. [place].  Contacts, dentures or bridgework may not be worn into surgery.  Leave suitcase in the car. After surgery it may be brought to your room.                Please read over the  following fact sheets you were given: _____________________________________________________________________             [facility] - Preparing for Surgery Before surgery, you can play an important role.  Because skin is not sterile, your skin needs to be as free of germs as possible.  You can reduce the number of germs on your skin by washing with CHG (chlorahexidine gluconate) soap before surgery.  CHG is an antiseptic cleaner which kills germs and bonds with the skin to continue killing germs even after washing. Please DO NOT use if you have an allergy to CHG or antibacterial soaps.  If your skin becomes reddened/irritated stop using the CHG and inform your nurse when you arrive at Short Stay. Do not shave (including legs and underarms) for at least 48 hours prior to the first CHG shower.  You may shave your face/neck. Please follow these instructions carefully:  1.  Shower with CHG Soap the night before surgery and the  morning of Surgery.  2.  If you choose to wash your hair, wash your hair first as usual with your  normal  shampoo.  3.  After you shampoo, rinse your hair and body thoroughly to remove the  shampoo.                           4.  Use CHG as you would any other liquid soap.  You can apply chg directly  to the skin and wash                       Gently with a scrungie or clean washcloth.  5.  Apply the CHG Soap to your body ONLY FROM THE NECK DOWN.   Do not use on face/ open                           Wound or open sores. Avoid contact with eyes, ears mouth and genitals (private parts).                       Wash face,  Genitals (private parts) with your normal soap.             6.  Wash thoroughly, paying special attention to the area where your surgery  will be performed.  7.  Thoroughly rinse your body with warm water from the neck down.  8.  DO NOT shower/wash with your normal soap after using and rinsing off  the CHG Soap.                9.  Pat yourself dry with a clean  towel.            10.  Wear clean pajamas.            11.  Place clean sheets on your bed the night of your first shower and do not  sleep with pets. Day of Surgery : Do not apply any lotions/deodorants the morning of surgery.  Please wear clean clothes to the hospital/surgery center.  FAILURE TO FOLLOW THESE INSTRUCTIONS MAY RESULT IN THE CANCELLATION OF YOUR SURGERY PATIENT SIGNATURE_________________________________  NURSE SIGNATURE__________________________________  ________________________________________________________________________   [name]  An incentive spirometer is a tool that can help keep your lungs clear and active. This tool measures how well you are filling your lungs with each breath. Taking long deep breaths may help reverse or decrease the chance of developing breathing (pulmonary) problems (especially infection) following:  A long period of time when you are unable to move or be active. BEFORE THE PROCEDURE   If the spirometer includes an indicator to show your best effort, your nurse or respiratory therapist will set it to a desired goal.  If possible, sit up straight or lean slightly forward. Try not to slouch.  Hold the incentive spirometer in an upright position. INSTRUCTIONS FOR USE  1. Sit on the edge of your bed if possible, or sit up as far as you can in bed or on a chair. 2. Hold the incentive spirometer in an upright position. 3. Breathe out normally. 4. Place the mouthpiece in your mouth and seal your lips tightly around it. 5. Breathe in slowly and as deeply as possible, raising  the piston or the ball toward the top of the column. 6. Hold your breath for 3-5 seconds or for as long as possible. Allow the piston or ball to fall to the bottom of the column. 7. Remove the mouthpiece from your mouth and breathe out normally. 8. Rest for a few seconds and repeat Steps 1 through 7 at least 10 times every 1-2 hours when you are awake. Take your  time and take a few normal breaths between deep breaths. 9. The spirometer may include an indicator to show your best effort. Use the indicator as a goal to work toward during each repetition. 10. After each set of 10 deep breaths, practice coughing to be sure your lungs are clear. If you have an incision (the cut made at the time of surgery), support your incision when coughing by placing a pillow or rolled up towels firmly against it. Once you are able to get out of bed, walk around indoors and cough well. You may stop using the incentive spirometer when instructed by your caregiver.  RISKS AND COMPLICATIONS  Take your time so you do not get dizzy or light-headed.  If you are in pain, you may need to take or ask for pain medication before doing incentive spirometry. It is harder to take a deep breath if you are having pain. AFTER USE  Rest and breathe slowly and easily.  It can be helpful to keep track of a log of your progress. Your caregiver can provide you with a simple table to help with this. If you are using the spirometer at home, follow these instructions: [place] IF:   You are having difficultly using the spirometer.  You have trouble using the spirometer as often as instructed.  Your pain medication is not giving enough relief while using the spirometer.  You develop fever of 100.5 F (38.1 C) or higher. SEEK IMMEDIATE MEDICAL CARE IF:   You cough up bloody sputum that had not been present before.  You develop fever of 102 F (38.9 C) or greater.  You develop worsening pain at or near the incision site. MAKE SURE YOU:   Understand these instructions.  Will watch your condition.  Will get help right away if you are not doing well or get worse. Document Released: 08/08/2006 Document Revised: 06/20/2011 Document Reviewed: 10/09/2006 Exit[employer] Patient Information 2014 Exit[employer],  [place].   ________________________________________________________________________  WHAT IS A BLOOD TRANSFUSION? Blood Transfusion Information  A transfusion is the replacement of blood or some of its parts. Blood is made up of multiple cells which provide different functions.  Red blood cells carry oxygen and are used for blood loss replacement.  White blood cells fight against infection.  Platelets control bleeding.  Plasma helps clot blood.  Other blood products are available for specialized needs, such as hemophilia or other clotting disorders. BEFORE THE TRANSFUSION  Who gives blood for transfusions?   Healthy volunteers who are fully evaluated to make sure their blood is safe. This is blood bank blood. Transfusion therapy is the safest it has ever been in the practice of medicine. Before blood is taken from a donor, a complete history is taken to make sure that person has no history of diseases nor engages in risky social behavior (examples are intravenous drug use or sexual activity with multiple partners). The donor's travel history is screened to minimize risk of transmitting infections, such as malaria. The donated blood is tested for signs of infectious diseases, such as HIV  and hepatitis. The blood is then tested to be sure it is compatible with you in order to minimize the chance of a transfusion reaction. If you or a relative donates blood, this is often done in anticipation of surgery and is not appropriate for emergency situations. It takes many days to process the donated blood. RISKS AND COMPLICATIONS Although transfusion therapy is very safe and saves many lives, the main dangers of transfusion include:   Getting an infectious disease.  Developing a transfusion reaction. This is an allergic reaction to something in the blood you were given. Every precaution is taken to prevent this. The decision to have a blood transfusion has been considered carefully by your caregiver  before blood is given. Blood is not given unless the benefits outweigh the risks. AFTER THE TRANSFUSION  Right after receiving a blood transfusion, you will usually feel much better and more energetic. This is especially true if your red blood cells have gotten low (anemic). The transfusion raises the level of the red blood cells which carry oxygen, and this usually causes an energy increase.  The nurse administering the transfusion will monitor you carefully for complications. HOME CARE INSTRUCTIONS  No special instructions are needed after a transfusion. You may find your energy is better. Speak with your caregiver about any limitations on activity for underlying diseases you may have. SEEK MEDICAL CARE IF:   Your condition is not improving after your transfusion.  You develop redness or irritation at the intravenous (IV) site. SEEK IMMEDIATE MEDICAL CARE IF:  Any of the following symptoms occur over the next 12 hours:  Shaking chills.  You have a temperature by mouth above 102 F (38.9 C), not controlled by medicine.  Chest, back, or muscle pain.  People around you feel you are not acting correctly or are confused.  Shortness of breath or difficulty breathing.  Dizziness and fainting.  You get a rash or develop hives.  You have a decrease in urine output.  Your urine turns a dark color or changes to pink, red, or brown. Any of the following symptoms occur over the next 10 days:  You have a temperature by mouth above 102 F (38.9 C), not controlled by medicine.  Shortness of breath.  Weakness after normal activity.  The white part of the eye turns yellow (jaundice).  You have a decrease in the amount of urine or are urinating less often.  Your urine turns a dark color or changes to pink, red, or brown. Document Released: 03/25/2000 Document Revised: 06/20/2011 Document Reviewed: 11/12/2007 [facility] Patient Information 2014 [place],  [place].  _______________________________________________________________________

## 2018-02-15 ENCOUNTER — Other Ambulatory Visit: Payer: Self-pay

## 2018-02-15 ENCOUNTER — Telehealth: Payer: Self-pay | Admitting: Family Medicine

## 2018-02-15 ENCOUNTER — Encounter (HOSPITAL_COMMUNITY)
Admission: RE | Admit: 2018-02-15 | Discharge: 2018-02-15 | Disposition: A | Discharge status: Home / Self Care | Payer: Medicare Other | Source: Ambulatory Visit | Attending: Gynecologic Oncology | Admitting: Gynecologic Oncology

## 2018-02-15 ENCOUNTER — Encounter (HOSPITAL_COMMUNITY): Payer: Self-pay

## 2018-02-15 DIAGNOSIS — I10 Essential (primary) hypertension: Secondary | ICD-10-CM | POA: Diagnosis not present

## 2018-02-15 DIAGNOSIS — E119 Type 2 diabetes mellitus without complications: Secondary | ICD-10-CM | POA: Diagnosis not present

## 2018-02-15 DIAGNOSIS — Z79899 Other long term (current) drug therapy: Secondary | ICD-10-CM | POA: Diagnosis not present

## 2018-02-15 DIAGNOSIS — Z6841 Body Mass Index (BMI) 40.0 and over, adult: Secondary | ICD-10-CM | POA: Diagnosis not present

## 2018-02-15 DIAGNOSIS — Z794 Long term (current) use of insulin: Secondary | ICD-10-CM | POA: Diagnosis not present

## 2018-02-15 DIAGNOSIS — G4733 Obstructive sleep apnea (adult) (pediatric): Secondary | ICD-10-CM | POA: Diagnosis not present

## 2018-02-15 DIAGNOSIS — N83201 Unspecified ovarian cyst, right side: Secondary | ICD-10-CM | POA: Diagnosis not present

## 2018-02-15 DIAGNOSIS — E039 Hypothyroidism, unspecified: Secondary | ICD-10-CM | POA: Diagnosis not present

## 2018-02-15 DIAGNOSIS — Z01818 Encounter for other preprocedural examination: Secondary | ICD-10-CM | POA: Diagnosis not present

## 2018-02-15 DIAGNOSIS — Z7982 Long term (current) use of aspirin: Secondary | ICD-10-CM | POA: Diagnosis not present

## 2018-02-15 DIAGNOSIS — Z888 Allergy status to other drugs, medicaments and biological substances status: Secondary | ICD-10-CM | POA: Diagnosis not present

## 2018-02-15 DIAGNOSIS — Z79890 Hormone replacement therapy (postmenopausal): Secondary | ICD-10-CM | POA: Diagnosis not present

## 2018-02-15 DIAGNOSIS — N83202 Unspecified ovarian cyst, left side: Secondary | ICD-10-CM | POA: Diagnosis not present

## 2018-02-15 DIAGNOSIS — C541 Malignant neoplasm of endometrium: Secondary | ICD-10-CM | POA: Diagnosis not present

## 2018-02-15 DIAGNOSIS — Z91040 Latex allergy status: Secondary | ICD-10-CM | POA: Diagnosis not present

## 2018-02-15 HISTORY — DX: Other specified postprocedural states: Z98.890

## 2018-02-15 HISTORY — DX: Malignant neoplasm of endometrium: C54.1

## 2018-02-15 LAB — CBC
HEMATOCRIT: 36 % (ref 36.0–46.0)
HEMOGLOBIN: 11.2 g/dL — AB (ref 12.0–15.0)
MCH: 30.6 pg (ref 26.0–34.0)
MCHC: 31.1 g/dL (ref 30.0–36.0)
MCV: 98.4 fL (ref 80.0–100.0)
Platelets: 240 10*3/uL (ref 150–400)
RBC: 3.66 MIL/uL — ABNORMAL LOW (ref 3.87–5.11)
RDW: 14 % (ref 11.5–15.5)
WBC: 8.8 10*3/uL (ref 4.0–10.5)
nRBC: 0 % (ref 0.0–0.2)

## 2018-02-15 LAB — COMPREHENSIVE METABOLIC PANEL
ALBUMIN: 3.8 g/dL (ref 3.5–5.0)
ALK PHOS: 49 U/L (ref 38–126)
ALT: 30 U/L (ref 0–44)
ANION GAP: 9 (ref 5–15)
AST: 39 U/L (ref 15–41)
BILIRUBIN TOTAL: 0.5 mg/dL (ref 0.3–1.2)
BUN: 23 mg/dL (ref 8–23)
CALCIUM: 9 mg/dL (ref 8.9–10.3)
CO2: 28 mmol/L (ref 22–32)
Chloride: 103 mmol/L (ref 98–111)
Creatinine, Ser: 1.04 mg/dL — ABNORMAL HIGH (ref 0.44–1.00)
GFR calc non Af Amer: 54 mL/min — ABNORMAL LOW (ref >60)
GLUCOSE: 100 mg/dL — AB (ref 70–99)
Potassium: 4.7 mmol/L (ref 3.5–5.1)
Sodium: 140 mmol/L (ref 135–145)
TOTAL PROTEIN: 7.3 g/dL (ref 6.5–8.1)

## 2018-02-15 LAB — URINALYSIS, ROUTINE W REFLEX MICROSCOPIC
Bilirubin Urine: NEGATIVE
Glucose, UA: NEGATIVE mg/dL
Ketones, ur: NEGATIVE mg/dL
Nitrite: NEGATIVE
Protein, ur: 100 mg/dL — AB
SPECIFIC GRAVITY, URINE: 1.019 (ref 1.005–1.030)
WBC, UA: >50 WBC/hpf — ABNORMAL HIGH (ref 0–5)
pH: 5 (ref 5.0–8.0)

## 2018-02-15 LAB — HEMOGLOBIN A1C
Hgb A1c MFr Bld: 7.1 % — ABNORMAL HIGH (ref 4.8–5.6)
Mean Plasma Glucose: 157.07 mg/dL

## 2018-02-15 LAB — GLUCOSE, CAPILLARY: GLUCOSE-CAPILLARY: 107 mg/dL — AB (ref 70–99)

## 2018-02-15 NOTE — Telephone Encounter (Signed)
Noted. Chart updated.

## 2018-02-15 NOTE — Telephone Encounter (Signed)
Patient's husband states wife has been diagnosed with stage 1 endometrial cancer by her GYN doctor. This is just an FYI, no action required at this time.

## 2018-02-15 NOTE — Telephone Encounter (Signed)
FYI

## 2018-02-16 LAB — ABO/RH: ABO/RH(D): O POS

## 2018-02-18 LAB — URINE CULTURE: Culture: >=100000 — AB

## 2018-02-18 MED ORDER — DEXTROSE 5 % IV SOLN
3.0000 g | INTRAVENOUS | Status: DC
  Filled 2018-02-18: qty 3000

## 2018-02-18 MED ORDER — DEXTROSE 5 % IV SOLN
3.0000 g | INTRAVENOUS | Status: AC
  Administered 2018-02-19: 3 g via INTRAVENOUS
  Filled 2018-02-18 (×2): qty 3000

## 2018-02-19 ENCOUNTER — Other Ambulatory Visit: Payer: Self-pay

## 2018-02-19 ENCOUNTER — Ambulatory Visit (HOSPITAL_COMMUNITY)
Admission: RE | Admit: 2018-02-19 | Discharge: 2018-02-20 | Disposition: A | Discharge status: Home / Self Care | Payer: Medicare Other | Source: Other Acute Inpatient Hospital | Attending: Gynecologic Oncology | Admitting: Gynecologic Oncology

## 2018-02-19 ENCOUNTER — Ambulatory Visit (HOSPITAL_BASED_OUTPATIENT_CLINIC_OR_DEPARTMENT_OTHER): Payer: Medicare Other | Admitting: Anesthesiology

## 2018-02-19 ENCOUNTER — Encounter (HOSPITAL_COMMUNITY): Payer: Self-pay | Admitting: Gynecologic Oncology

## 2018-02-19 ENCOUNTER — Encounter (HOSPITAL_COMMUNITY)
Admission: RE | Disposition: A | Discharge status: Home / Self Care | Payer: Self-pay | Source: Other Acute Inpatient Hospital | Attending: Gynecologic Oncology

## 2018-02-19 DIAGNOSIS — N83202 Unspecified ovarian cyst, left side: Secondary | ICD-10-CM | POA: Diagnosis not present

## 2018-02-19 DIAGNOSIS — Z7982 Long term (current) use of aspirin: Secondary | ICD-10-CM | POA: Diagnosis not present

## 2018-02-19 DIAGNOSIS — G4733 Obstructive sleep apnea (adult) (pediatric): Secondary | ICD-10-CM | POA: Insufficient documentation

## 2018-02-19 DIAGNOSIS — Z79899 Other long term (current) drug therapy: Secondary | ICD-10-CM | POA: Diagnosis not present

## 2018-02-19 DIAGNOSIS — I10 Essential (primary) hypertension: Secondary | ICD-10-CM | POA: Insufficient documentation

## 2018-02-19 DIAGNOSIS — Z888 Allergy status to other drugs, medicaments and biological substances status: Secondary | ICD-10-CM | POA: Insufficient documentation

## 2018-02-19 DIAGNOSIS — Z91040 Latex allergy status: Secondary | ICD-10-CM | POA: Insufficient documentation

## 2018-02-19 DIAGNOSIS — Z794 Long term (current) use of insulin: Secondary | ICD-10-CM | POA: Insufficient documentation

## 2018-02-19 DIAGNOSIS — E039 Hypothyroidism, unspecified: Secondary | ICD-10-CM | POA: Insufficient documentation

## 2018-02-19 DIAGNOSIS — E119 Type 2 diabetes mellitus without complications: Secondary | ICD-10-CM | POA: Insufficient documentation

## 2018-02-19 DIAGNOSIS — N83201 Unspecified ovarian cyst, right side: Secondary | ICD-10-CM | POA: Insufficient documentation

## 2018-02-19 DIAGNOSIS — Z6841 Body Mass Index (BMI) 40.0 and over, adult: Secondary | ICD-10-CM | POA: Diagnosis not present

## 2018-02-19 DIAGNOSIS — C541 Malignant neoplasm of endometrium: Secondary | ICD-10-CM | POA: Diagnosis not present

## 2018-02-19 DIAGNOSIS — E11319 Type 2 diabetes mellitus with unspecified diabetic retinopathy without macular edema: Secondary | ICD-10-CM

## 2018-02-19 DIAGNOSIS — Z79890 Hormone replacement therapy (postmenopausal): Secondary | ICD-10-CM | POA: Diagnosis not present

## 2018-02-19 DIAGNOSIS — IMO0001 Reserved for inherently not codable concepts without codable children: Secondary | ICD-10-CM

## 2018-02-19 HISTORY — PX: LYMPH NODE BIOPSY: SHX201

## 2018-02-19 HISTORY — PX: ROBOTIC ASSISTED TOTAL HYSTERECTOMY WITH BILATERAL SALPINGO OOPHERECTOMY: SHX6086

## 2018-02-19 HISTORY — DX: Family history of other specified conditions: Z84.89

## 2018-02-19 LAB — GLUCOSE, CAPILLARY
GLUCOSE-CAPILLARY: 191 mg/dL — AB (ref 70–99)
GLUCOSE-CAPILLARY: 360 mg/dL — AB (ref 70–99)
GLUCOSE-CAPILLARY: 385 mg/dL — AB (ref 70–99)
Glucose-Capillary: 104 mg/dL — ABNORMAL HIGH (ref 70–99)

## 2018-02-19 LAB — TYPE AND SCREEN
ABO/RH(D): O POS
Antibody Screen: NEGATIVE

## 2018-02-19 SURGERY — HYSTERECTOMY, TOTAL, ROBOT-ASSISTED, LAPAROSCOPIC, WITH BILATERAL SALPINGO-OOPHORECTOMY
Anesthesia: General | Site: Abdomen

## 2018-02-19 MED ORDER — ONDANSETRON HCL 4 MG/2ML IJ SOLN
INTRAMUSCULAR | Status: AC
  Filled 2018-02-19: qty 2

## 2018-02-19 MED ORDER — GABAPENTIN 300 MG PO CAPS
300.0000 mg | ORAL_CAPSULE | ORAL | Status: AC
  Administered 2018-02-19: 300 mg via ORAL
  Filled 2018-02-19: qty 1

## 2018-02-19 MED ORDER — FUROSEMIDE 20 MG PO TABS
20.0000 mg | ORAL_TABLET | Freq: Every day | ORAL | Status: DC
  Administered 2018-02-19 – 2018-02-20 (×2): 20 mg via ORAL
  Filled 2018-02-19 (×2): qty 1

## 2018-02-19 MED ORDER — ONDANSETRON HCL 4 MG PO TABS: 4.0000 mg | ORAL_TABLET | Freq: Four times a day (QID) | ORAL | Status: DC | PRN

## 2018-02-19 MED ORDER — PHENYLEPHRINE HCL 10 MG/ML IJ SOLN
INTRAMUSCULAR | Status: AC
  Filled 2018-02-19: qty 1

## 2018-02-19 MED ORDER — SODIUM CHLORIDE 0.9 % IV SOLN
INTRAVENOUS | Status: DC
  Administered 2018-02-19: 15:00:00 via INTRAVENOUS

## 2018-02-19 MED ORDER — ACETAMINOPHEN 500 MG PO TABS
1000.0000 mg | ORAL_TABLET | ORAL | Status: AC
  Administered 2018-02-19: 1000 mg via ORAL
  Filled 2018-02-19: qty 2

## 2018-02-19 MED ORDER — ONDANSETRON HCL 4 MG/2ML IJ SOLN
INTRAMUSCULAR | Status: DC | PRN
  Administered 2018-02-19: 4 mg via INTRAVENOUS

## 2018-02-19 MED ORDER — DEXAMETHASONE SODIUM PHOSPHATE 10 MG/ML IJ SOLN
INTRAMUSCULAR | Status: AC
  Filled 2018-02-19: qty 1

## 2018-02-19 MED ORDER — LEVOTHYROXINE SODIUM 25 MCG PO TABS
125.0000 ug | ORAL_TABLET | Freq: Every day | ORAL | Status: DC
  Administered 2018-02-20: 125 ug via ORAL
  Filled 2018-02-19: qty 1

## 2018-02-19 MED ORDER — INSULIN ASPART 100 UNIT/ML ~~LOC~~ SOLN
0.0000 [IU] | Freq: Three times a day (TID) | SUBCUTANEOUS | Status: DC
  Administered 2018-02-20 (×2): 4 [IU] via SUBCUTANEOUS

## 2018-02-19 MED ORDER — LIDOCAINE HCL (CARDIAC) PF 100 MG/5ML IV SOSY
PREFILLED_SYRINGE | INTRAVENOUS | Status: DC | PRN
  Administered 2018-02-19: 100 mg via INTRAVENOUS

## 2018-02-19 MED ORDER — LIDOCAINE 2% (20 MG/ML) 5 ML SYRINGE
INTRAMUSCULAR | Status: AC
  Filled 2018-02-19: qty 5

## 2018-02-19 MED ORDER — SUGAMMADEX SODIUM 500 MG/5ML IV SOLN
INTRAVENOUS | Status: AC
  Filled 2018-02-19: qty 5

## 2018-02-19 MED ORDER — ALBUTEROL SULFATE HFA 108 (90 BASE) MCG/ACT IN AERS
1.0000 | INHALATION_SPRAY | RESPIRATORY_TRACT | Status: DC | PRN
  Administered 2018-02-19: 1 via RESPIRATORY_TRACT

## 2018-02-19 MED ORDER — MIDAZOLAM HCL 2 MG/2ML IJ SOLN
INTRAMUSCULAR | Status: AC
  Filled 2018-02-19: qty 2

## 2018-02-19 MED ORDER — ONDANSETRON HCL 4 MG/2ML IJ SOLN: 4.0000 mg | Freq: Once | INTRAMUSCULAR | Status: DC | PRN

## 2018-02-19 MED ORDER — ALBUTEROL SULFATE (2.5 MG/3ML) 0.083% IN NEBU: 2.5000 mg | INHALATION_SOLUTION | RESPIRATORY_TRACT | Status: DC | PRN

## 2018-02-19 MED ORDER — MOMETASONE FURO-FORMOTEROL FUM 200-5 MCG/ACT IN AERO
2.0000 | INHALATION_SPRAY | Freq: Two times a day (BID) | RESPIRATORY_TRACT | Status: DC
  Administered 2018-02-19 – 2018-02-20 (×2): 2 via RESPIRATORY_TRACT
  Filled 2018-02-19: qty 8.8

## 2018-02-19 MED ORDER — DEXAMETHASONE SODIUM PHOSPHATE 4 MG/ML IJ SOLN: 4.0000 mg | INTRAMUSCULAR | Status: DC

## 2018-02-19 MED ORDER — NITROFURANTOIN MONOHYD MACRO 100 MG PO CAPS
100.0000 mg | ORAL_CAPSULE | Freq: Two times a day (BID) | ORAL | Status: AC
  Administered 2018-02-19 – 2018-02-20 (×3): 100 mg via ORAL
  Filled 2018-02-19 (×3): qty 1

## 2018-02-19 MED ORDER — SCOPOLAMINE 1 MG/3DAYS TD PT72
1.0000 | MEDICATED_PATCH | TRANSDERMAL | Status: DC
  Administered 2018-02-19: 1.5 mg via TRANSDERMAL
  Filled 2018-02-19: qty 1

## 2018-02-19 MED ORDER — HYDROMORPHONE HCL 1 MG/ML IJ SOLN: 0.5000 mg | INTRAMUSCULAR | Status: DC | PRN

## 2018-02-19 MED ORDER — ONDANSETRON HCL 4 MG/2ML IJ SOLN
4.0000 mg | Freq: Four times a day (QID) | INTRAMUSCULAR | Status: DC | PRN
  Administered 2018-02-20: 4 mg via INTRAVENOUS
  Filled 2018-02-19: qty 2

## 2018-02-19 MED ORDER — ROCURONIUM BROMIDE 100 MG/10ML IV SOLN
INTRAVENOUS | Status: DC | PRN
  Administered 2018-02-19: 30 mg via INTRAVENOUS
  Administered 2018-02-19: 10 mg via INTRAVENOUS
  Administered 2018-02-19: 40 mg via INTRAVENOUS
  Administered 2018-02-19: 10 mg via INTRAVENOUS

## 2018-02-19 MED ORDER — MONTELUKAST SODIUM 10 MG PO TABS
10.0000 mg | ORAL_TABLET | Freq: Every evening | ORAL | Status: DC
  Administered 2018-02-19: 10 mg via ORAL
  Filled 2018-02-19: qty 1

## 2018-02-19 MED ORDER — INSULIN NPH (HUMAN) (ISOPHANE) 100 UNIT/ML ~~LOC~~ SUSP
60.0000 [IU] | Freq: Three times a day (TID) | SUBCUTANEOUS | Status: DC
  Administered 2018-02-19 – 2018-02-20 (×3): 60 [IU] via SUBCUTANEOUS
  Filled 2018-02-19 (×2): qty 10

## 2018-02-19 MED ORDER — OXYCODONE HCL 5 MG PO TABS
5.0000 mg | ORAL_TABLET | ORAL | Status: DC | PRN
  Administered 2018-02-19 (×2): 10 mg via ORAL
  Filled 2018-02-19 (×2): qty 2

## 2018-02-19 MED ORDER — OXYCODONE HCL 5 MG PO TABS: 5.0000 mg | ORAL_TABLET | Freq: Once | ORAL | Status: DC | PRN

## 2018-02-19 MED ORDER — SODIUM CHLORIDE 0.9 % IV SOLN
INTRAVENOUS | Status: DC | PRN
  Administered 2018-02-19: 40 ug/min via INTRAVENOUS

## 2018-02-19 MED ORDER — ENOXAPARIN SODIUM 40 MG/0.4ML ~~LOC~~ SOLN
40.0000 mg | SUBCUTANEOUS | Status: AC
  Administered 2018-02-19: 40 mg via SUBCUTANEOUS
  Filled 2018-02-19: qty 0.4

## 2018-02-19 MED ORDER — ACETAMINOPHEN 500 MG PO TABS
1000.0000 mg | ORAL_TABLET | Freq: Four times a day (QID) | ORAL | Status: DC
  Administered 2018-02-19 – 2018-02-20 (×3): 1000 mg via ORAL
  Filled 2018-02-19 (×3): qty 2

## 2018-02-19 MED ORDER — GABAPENTIN 300 MG PO CAPS
300.0000 mg | ORAL_CAPSULE | Freq: Two times a day (BID) | ORAL | Status: DC
  Administered 2018-02-19 – 2018-02-20 (×3): 300 mg via ORAL
  Filled 2018-02-19 (×3): qty 1

## 2018-02-19 MED ORDER — ROCURONIUM BROMIDE 10 MG/ML (PF) SYRINGE
PREFILLED_SYRINGE | INTRAVENOUS | Status: AC
  Filled 2018-02-19: qty 10

## 2018-02-19 MED ORDER — TRAMADOL HCL 50 MG PO TABS: 50.0000 mg | ORAL_TABLET | Freq: Four times a day (QID) | ORAL | Status: DC | PRN

## 2018-02-19 MED ORDER — FENTANYL CITRATE (PF) 250 MCG/5ML IJ SOLN
INTRAMUSCULAR | Status: DC | PRN
  Administered 2018-02-19 (×2): 25 ug via INTRAVENOUS
  Administered 2018-02-19: 100 ug via INTRAVENOUS

## 2018-02-19 MED ORDER — ENALAPRIL MALEATE 10 MG PO TABS
10.0000 mg | ORAL_TABLET | Freq: Every day | ORAL | Status: DC
  Filled 2018-02-19 (×2): qty 1

## 2018-02-19 MED ORDER — PROPOFOL 10 MG/ML IV BOLUS
INTRAVENOUS | Status: DC | PRN
  Administered 2018-02-19: 200 mg via INTRAVENOUS
  Administered 2018-02-19: 100 mg via INTRAVENOUS

## 2018-02-19 MED ORDER — DEXAMETHASONE SODIUM PHOSPHATE 10 MG/ML IJ SOLN
INTRAMUSCULAR | Status: DC | PRN
  Administered 2018-02-19: 5 mg via INTRAVENOUS

## 2018-02-19 MED ORDER — CEFAZOLIN SODIUM-DEXTROSE 2-4 GM/100ML-% IV SOLN
INTRAVENOUS | Status: AC
  Filled 2018-02-19: qty 100

## 2018-02-19 MED ORDER — LEVOTHYROXINE SODIUM 25 MCG PO TABS: 125.0000 ug | ORAL_TABLET | Freq: Every day | ORAL | Status: DC

## 2018-02-19 MED ORDER — LACTATED RINGERS IV SOLN
INTRAVENOUS | Status: DC
  Administered 2018-02-19 (×2): via INTRAVENOUS

## 2018-02-19 MED ORDER — PHENYLEPHRINE 40 MCG/ML (10ML) SYRINGE FOR IV PUSH (FOR BLOOD PRESSURE SUPPORT)
PREFILLED_SYRINGE | INTRAVENOUS | Status: AC
  Filled 2018-02-19: qty 10

## 2018-02-19 MED ORDER — METFORMIN HCL 500 MG PO TABS
1000.0000 mg | ORAL_TABLET | Freq: Every day | ORAL | Status: DC
  Administered 2018-02-19: 1000 mg via ORAL
  Filled 2018-02-19: qty 2

## 2018-02-19 MED ORDER — EPHEDRINE SULFATE-NACL 50-0.9 MG/10ML-% IV SOSY
PREFILLED_SYRINGE | INTRAVENOUS | Status: DC | PRN
  Administered 2018-02-19: 5 mg via INTRAVENOUS
  Administered 2018-02-19: 10 mg via INTRAVENOUS
  Administered 2018-02-19: 5 mg via INTRAVENOUS

## 2018-02-19 MED ORDER — EPHEDRINE 5 MG/ML INJ
INTRAVENOUS | Status: AC
  Filled 2018-02-19: qty 10

## 2018-02-19 MED ORDER — FENTANYL CITRATE (PF) 250 MCG/5ML IJ SOLN
INTRAMUSCULAR | Status: AC
  Filled 2018-02-19: qty 5

## 2018-02-19 MED ORDER — PRAVASTATIN SODIUM 20 MG PO TABS
20.0000 mg | ORAL_TABLET | Freq: Every day | ORAL | Status: DC
  Administered 2018-02-20: 20 mg via ORAL
  Filled 2018-02-19: qty 1

## 2018-02-19 MED ORDER — ENOXAPARIN SODIUM 40 MG/0.4ML ~~LOC~~ SOLN
40.0000 mg | SUBCUTANEOUS | Status: DC
  Administered 2018-02-20: 40 mg via SUBCUTANEOUS
  Filled 2018-02-19: qty 0.4

## 2018-02-19 MED ORDER — OXYCODONE HCL 5 MG/5ML PO SOLN
5.0000 mg | Freq: Once | ORAL | Status: DC | PRN
  Filled 2018-02-19: qty 5

## 2018-02-19 MED ORDER — INSULIN ASPART 100 UNIT/ML ~~LOC~~ SOLN
0.0000 [IU] | Freq: Every day | SUBCUTANEOUS | Status: DC
  Administered 2018-02-19: 5 [IU] via SUBCUTANEOUS

## 2018-02-19 MED ORDER — PROPOFOL 10 MG/ML IV BOLUS
INTRAVENOUS | Status: AC
  Filled 2018-02-19: qty 40

## 2018-02-19 MED ORDER — SUGAMMADEX SODIUM 500 MG/5ML IV SOLN
INTRAVENOUS | Status: DC | PRN
  Administered 2018-02-19: 260 mg via INTRAVENOUS

## 2018-02-19 MED ORDER — LACTATED RINGERS IR SOLN
Status: DC | PRN
  Administered 2018-02-19: 3000 mL

## 2018-02-19 MED ORDER — METFORMIN HCL 500 MG PO TABS
500.0000 mg | ORAL_TABLET | Freq: Every day | ORAL | Status: DC
  Administered 2018-02-20: 500 mg via ORAL
  Filled 2018-02-19: qty 1

## 2018-02-19 MED ORDER — PHENYLEPHRINE 40 MCG/ML (10ML) SYRINGE FOR IV PUSH (FOR BLOOD PRESSURE SUPPORT)
PREFILLED_SYRINGE | INTRAVENOUS | Status: DC | PRN
  Administered 2018-02-19 (×2): 80 ug via INTRAVENOUS
  Administered 2018-02-19: 120 ug via INTRAVENOUS
  Administered 2018-02-19: 40 ug via INTRAVENOUS
  Administered 2018-02-19: 80 ug via INTRAVENOUS

## 2018-02-19 MED ORDER — STERILE WATER FOR INJECTION IJ SOLN
INTRAMUSCULAR | Status: AC
  Filled 2018-02-19: qty 10

## 2018-02-19 MED ORDER — FENTANYL CITRATE (PF) 100 MCG/2ML IJ SOLN: 25.0000 ug | INTRAMUSCULAR | Status: DC | PRN

## 2018-02-19 MED ORDER — SUCCINYLCHOLINE CHLORIDE 20 MG/ML IJ SOLN
INTRAMUSCULAR | Status: DC | PRN
  Administered 2018-02-19: 140 mg via INTRAVENOUS

## 2018-02-19 MED ORDER — MIDAZOLAM HCL 2 MG/2ML IJ SOLN
INTRAMUSCULAR | Status: DC | PRN
  Administered 2018-02-19: 2 mg via INTRAVENOUS

## 2018-02-19 SURGICAL SUPPLY — 63 items
ADH SKN CLS APL DERMABOND .7 (GAUZE/BANDAGES/DRESSINGS) ×1
AGENT HMST KT MTR STRL THRMB (HEMOSTASIS)
APL ESCP 34 STRL LF DISP (HEMOSTASIS)
APPLICATOR SURGIFLO ENDO (HEMOSTASIS) IMPLANT
BAG LAPAROSCOPIC 12 15 PORT 16 (BASKET) IMPLANT
BAG RETRIEVAL 12/15 (BASKET)
BAG SPEC RTRVL LRG 6X4 10 (ENDOMECHANICALS)
COVER BACK TABLE 60X90IN (DRAPES) ×2 IMPLANT
COVER TIP SHEARS 8 DVNC (MISCELLANEOUS) ×1 IMPLANT
COVER TIP SHEARS 8MM DA VINCI (MISCELLANEOUS) ×1
COVER WAND RF STERILE (DRAPES) IMPLANT
DERMABOND ADVANCED (GAUZE/BANDAGES/DRESSINGS) ×1
DERMABOND ADVANCED .7 DNX12 (GAUZE/BANDAGES/DRESSINGS) ×1 IMPLANT
DRAPE ARM DVNC X/XI (DISPOSABLE) ×4 IMPLANT
DRAPE COLUMN DVNC XI (DISPOSABLE) ×1 IMPLANT
DRAPE DA VINCI XI ARM (DISPOSABLE) ×4
DRAPE DA VINCI XI COLUMN (DISPOSABLE) ×1
DRAPE SHEET LG 3/4 BI-LAMINATE (DRAPES) ×2 IMPLANT
DRAPE SURG IRRIG POUCH 19X23 (DRAPES) ×2 IMPLANT
ELECT REM PT RETURN 15FT ADLT (MISCELLANEOUS) ×2 IMPLANT
GLOVE BIO SURGEON STRL SZ 6 (GLOVE) ×8 IMPLANT
GLOVE BIO SURGEON STRL SZ 6.5 (GLOVE) ×1 IMPLANT
GLOVE BIOGEL PI IND STRL 7.0 (GLOVE) IMPLANT
GLOVE BIOGEL PI INDICATOR 7.0 (GLOVE) ×1
GOWN STRL REUS W/ TWL LRG LVL3 (GOWN DISPOSABLE) ×2 IMPLANT
GOWN STRL REUS W/TWL LRG LVL3 (GOWN DISPOSABLE) ×6
GOWN STRL REUS W/TWL XL LVL3 (GOWN DISPOSABLE) ×1 IMPLANT
HOLDER FOLEY CATH W/STRAP (MISCELLANEOUS) ×2 IMPLANT
IRRIG SUCT STRYKERFLOW 2 WTIP (MISCELLANEOUS) ×2
IRRIGATION SUCT STRKRFLW 2 WTP (MISCELLANEOUS) ×1 IMPLANT
KIT PROCEDURE DA VINCI SI (MISCELLANEOUS) ×1
KIT PROCEDURE DVNC SI (MISCELLANEOUS) IMPLANT
MANIPULATOR UTERINE 4.5 ZUMI (MISCELLANEOUS) ×2 IMPLANT
NDL SPNL 18GX3.5 QUINCKE PK (NEEDLE) IMPLANT
NEEDLE SPNL 18GX3.5 QUINCKE PK (NEEDLE) ×4 IMPLANT
OBTURATOR OPTICAL STANDARD 8MM (TROCAR) ×1
OBTURATOR OPTICAL STND 8 DVNC (TROCAR) ×1
OBTURATOR OPTICALSTD 8 DVNC (TROCAR) ×1 IMPLANT
PACK ROBOT GYN CUSTOM WL (TRAY / TRAY PROCEDURE) ×2 IMPLANT
PAD POSITIONING PINK XL (MISCELLANEOUS) ×2 IMPLANT
PORT ACCESS TROCAR AIRSEAL 12 (TROCAR) ×1 IMPLANT
PORT ACCESS TROCAR AIRSEAL 5M (TROCAR) ×1
POUCH SPECIMEN RETRIEVAL 10MM (ENDOMECHANICALS) IMPLANT
RETRACTOR LAPSCP 12X46 CVD (ENDOMECHANICALS) IMPLANT
RTRCTR LAPSCP 12X46 CVD (ENDOMECHANICALS) ×2
SEAL CANN UNIV 5-8 DVNC XI (MISCELLANEOUS) ×4 IMPLANT
SEAL XI 5MM-8MM UNIVERSAL (MISCELLANEOUS) ×4
SET TRI-LUMEN FLTR TB AIRSEAL (TUBING) ×2 IMPLANT
SURGIFLO W/THROMBIN 8M KIT (HEMOSTASIS) IMPLANT
SUT MNCRL AB 4-0 PS2 18 (SUTURE) ×2 IMPLANT
SUT VIC AB 0 CT1 27 (SUTURE) ×8
SUT VIC AB 0 CT1 27XBRD ANTBC (SUTURE) IMPLANT
SUT VIC AB 2-0 CT1 27 (SUTURE) ×2
SUT VIC AB 2-0 CT1 27XBRD (SUTURE) IMPLANT
SUT VIC AB 2-0 CT2 27 (SUTURE) ×1 IMPLANT
SYR 10ML LL (SYRINGE) IMPLANT
TOWEL OR NON WOVEN STRL DISP B (DISPOSABLE) ×2 IMPLANT
TRAP SPECIMEN MUCOUS 40CC (MISCELLANEOUS) IMPLANT
TRAY FOL W/BAG SLVR 16FR STRL (SET/KITS/TRAYS/PACK) IMPLANT
TRAY FOLEY MTR SLVR 16FR STAT (SET/KITS/TRAYS/PACK) ×1 IMPLANT
TRAY FOLEY W/BAG SLVR 16FR LF (SET/KITS/TRAYS/PACK) ×2
UNDERPAD 30X30 (UNDERPADS AND DIAPERS) ×2 IMPLANT
WATER STERILE IRR 1000ML POUR (IV SOLUTION) ×2 IMPLANT

## 2018-02-19 NOTE — Transfer of Care (Signed)
Immediate Anesthesia Transfer of Care Note  Patient: [name]  Procedure(s) Performed: XI ROBOTIC ASSISTED TOTAL HYSTERECTOMY WITH BILATERAL SALPINGO OOPHORECTOMY (N/A Abdomen) Sentinel LYMPH NODE BIOPSY (N/A Abdomen)  Patient Location: PACU  Anesthesia Type:General  Level of Consciousness: awake, alert , oriented and patient cooperative  Airway & Oxygen Therapy: Patient Spontanous Breathing and Patient connected to face mask oxygen  Post-op Assessment: Report given to RN and Post -op Vital signs reviewed and stable  Post vital signs: Reviewed and stable  Last Vitals:  Vitals Value Taken Time  BP    Temp    Pulse    Resp    SpO2      Last Pain:  Vitals:   02/19/18 0600  TempSrc:   PainSc: 7       Patients Stated Pain Goal: 5 (36/68/15 [id number])  Complications: No apparent anesthesia complications

## 2018-02-19 NOTE — Anesthesia Postprocedure Evaluation (Signed)
Anesthesia Post Note  Patient: [name]  Procedure(s) Performed: XI ROBOTIC ASSISTED TOTAL HYSTERECTOMY WITH BILATERAL SALPINGO OOPHORECTOMY (N/A Abdomen) Sentinel LYMPH NODE BIOPSY (N/A Abdomen)     Patient location during evaluation: PACU Anesthesia Type: General Level of consciousness: awake and alert Pain management: pain level controlled Vital Signs Assessment: post-procedure vital signs reviewed and stable Respiratory status: spontaneous breathing, nonlabored ventilation, respiratory function stable and patient connected to nasal cannula oxygen Cardiovascular status: blood pressure returned to baseline and stable Postop Assessment: no apparent nausea or vomiting Anesthetic complications: no    Last Vitals:  Vitals:   02/19/18 1030 02/19/18 1045  BP: (!) 125/48 (!) 128/50  Pulse: 70 75  Resp: 10 12  Temp:    SpO2: 99% 99%    Last Pain:  Vitals:   02/19/18 1045  TempSrc:   PainSc: 0-No pain                 Lidia Collum

## 2018-02-19 NOTE — Anesthesia Procedure Notes (Signed)
Procedure Name: Intubation Date/Time: 02/19/2018 7:41 AM Performed by: Raenette Rover, CRNA Pre-anesthesia Checklist: Patient identified, Emergency Drugs available, Suction available and Patient being monitored Patient Re-evaluated:Patient Re-evaluated prior to induction Oxygen Delivery Method: Circle system utilized Preoxygenation: Pre-oxygenation with 100% oxygen Induction Type: IV induction and Rapid sequence Laryngoscope Size: Glidescope and 3 Grade View: Grade I Tube type: Oral Tube size: 7.0 mm Number of attempts: 1 Airway Equipment and Method: Video-laryngoscopy and Stylet Placement Confirmation: ETT inserted through vocal cords under direct vision,  positive ETCO2,  CO2 detector and breath sounds checked- equal and bilateral Secured at: 21 cm Tube secured with: Tape Dental Injury: Teeth and Oropharynx as per pre-operative assessment  Comments: Anterior, small mouth opening.

## 2018-02-19 NOTE — Interval H&P Note (Signed)
History and Physical Interval Note:  02/19/2018 7:13 AM  [name]  has presented today for surgery, with the diagnosis of uterine cancer  The various methods of treatment have been discussed with the patient and family. After consideration of risks, benefits and other options for treatment, the patient has consented to  Procedure(s): XI ROBOTIC ASSISTED TOTAL HYSTERECTOMY WITH BILATERAL SALPINGO OOPHORECTOMY (N/A) Sentinel LYMPH NODE BIOPSY (N/A) as a surgical intervention .  The patient's history has been reviewed, patient examined, no change in status, stable for surgery.  I have reviewed the patient's chart and labs.  Questions were answered to the patient's satisfaction.     Thereasa Solo

## 2018-02-19 NOTE — Anesthesia Preprocedure Evaluation (Addendum)
Anesthesia Evaluation  Patient identified by MRN, date of birth, ID band Patient awake    Reviewed: Allergy & Precautions, NPO status , Patient's Chart, lab work & pertinent test results  History of Anesthesia Complications Negative for: history of anesthetic complications  Airway Mallampati: III  TM Distance: >3 FB Neck ROM: Full    Dental no notable dental hx.    Pulmonary asthma , sleep apnea and Continuous Positive Airway Pressure Ventilation ,    Pulmonary exam normal        Cardiovascular hypertension, Normal cardiovascular exam  TTE 10/11/16: EF 55-60%, grade 1 DD, no significant valvular abnormality   Neuro/Psych negative neurological ROS  negative psych ROS   GI/Hepatic GERD  Poorly Controlled,(+)     substance abuse  ,   Endo/Other  diabetes, Type 2, Insulin DependentHypothyroidism Morbid obesity  Renal/GU negative Renal ROS  negative genitourinary   Musculoskeletal  (+) Arthritis , Osteoarthritis,  narcotic dependent  Abdominal (+) + obese,   Peds  Hematology negative hematology ROS (+)   Anesthesia Other Findings   Reproductive/Obstetrics                            Anesthesia Physical Anesthesia Plan  ASA: III  Anesthesia Plan: General   Post-op Pain Management:    Induction: Intravenous and Rapid sequence  PONV Risk Score and Plan: 4 or greater and Ondansetron, Dexamethasone, Midazolam and Treatment may vary due to age or medical condition  Airway Management Planned: Oral ETT  Additional Equipment: None  Intra-op Plan:   Post-operative Plan: Extubation in OR  Informed Consent: I have reviewed the patients History and Physical, chart, labs and discussed the procedure including the risks, benefits and alternatives for the proposed anesthesia with the patient or authorized representative who has indicated his/her understanding and acceptance.     Plan Discussed  with:   Anesthesia Plan Comments:        Anesthesia Quick Evaluation

## 2018-02-19 NOTE — Op Note (Signed)
OPERATIVE NOTE 02/19/18  Surgeon: Donaciano Eva   Assistants: Dr Precious Haws (an MD assistant was necessary for tissue manipulation, management of robotic instrumentation, retraction and positioning due to the complexity of the case and hospital policies).   Anesthesia: General endotracheal anesthesia  ASA Class: 3   Pre-operative Diagnosis: endometrial cancer grade 1, extreme morbid obesity (BMI 55kg/m2).   Post-operative Diagnosis: same,   Operation: Robotic-assisted laparoscopic total hysterectomy with bilateral salpingoophorectomy (22 modifier for extreme obesity - see details in body of operative note)   Surgeon: Donaciano Eva  Assistant Surgeon: Precious Haws MD  Anesthesia: GET  Urine Output: 100cc  Operative Findings:  : extreme intraperitoneal adiposity which prevented retroperitoneal visualization of the lymph node basins and increased the duration of the procedure by 1 hour (for additional instrumentation, placement of retraction sutures, additional personnel required for assistance.  8cm uterus, grossly normal ovaries, surgically interrupted tubes.   Estimated Blood Loss:  25cc      Total IV Fluids: 800 ml         Specimens: uterus, cervix, bilateral tubes and ovaries         Complications:  None; patient tolerated the procedure well.         Disposition: PACU - hemodynamically stable.  Procedure Details  The patient was seen in the Holding Room. The risks, benefits, complications, treatment options, and expected outcomes were discussed with the patient.  The patient concurred with the proposed plan, giving informed consent.  The site of surgery properly noted/marked. The patient was identified as [name] and the procedure verified as a Robotic-assisted hysterectomy with bilateral salpingo oophorectomy with SLN biopsy. A Time Out was held and the above information confirmed.  After induction of anesthesia, the patient was draped and prepped  in the usual sterile manner. The patient was placed in supine position after anesthesia and draped and prepped in the usual sterile manner. The abdominal drape was placed after the CholoraPrep had been allowed to dry for 3 minutes.  Her arms were tucked to her side with all appropriate precautions.  The shoulders were stabilized with padded shoulder blocks applied to the acromium processes.  The patient was placed in the semi-lithotomy position in [place].  The perineum was prepped with Betadine. The patient was then prepped.  It required additional personnel in the operating room to position the patient as she had such extreme adiposity. An additional IV needed to be placed, and extensively padding of the arms and legs and shoulders was required.   Her pannus needed to be extensively taped to the bed in order to prevent migration during the surgery.   The positioning and prep of the patient took 1 hour from time of entry in the room until the patient was ready for procedure to start. This is twice the normal duration for this type of procedure and the reason for this extended time was due to the patient's extreme obesity. .   Foley catheter was placed with substantial difficulty due to the large mons pannus and this required an additional assistant in the OR for retraction of fatty tissues to visualize the urethra.  A sterile speculum was placed in the vagina.  The cervix was grasped with a single-tooth tenaculum. 2mg  total of ICG was injected into the cervical stroma at 2 and 9 o'clock with 1cc injected at a 1cm and 59mm depth (concentration 0.5mg /ml) in all locations. The cervix was dilated with Kennon Rounds dilators.  The ZUMI uterine manipulator with  a medium colpotomizer ring was placed.  A pneum occluder balloon was placed over the manipulator.  OG tube placement was confirmed and to suction.   Next, a 5 mm skin incision was made 1 cm below the subcostal margin in the midclavicular line.  The 5 mm  Optiview port and scope was used for direct entry.  Opening pressure was under 10 mm CO2.  The abdomen was insufflated and the findings were noted as above.   At this point and all points during the procedure, the patient's intra-abdominal pressure did not exceed 15 mmHg. Next, an 8 mm skin incision was made 5cm above the umbilicus and a right and left port was placed about 10 cm lateral to the robot port on the right and left side.  A fourth arm was placed in the left lower quadrant 2 cm above and superior and medial to the anterior superior iliac spine.  All ports were placed under direct visualization.  The patient was placed in steep Trendelenburg.  Bowel was folded away into the upper abdomen. Sharp adhesiolysis was performed to mobilize an adhesion between the omentum and the peritoneum in the left upper quadrant. There was minimal bleeding from the peritoneum and omentum encountered which was made hemostatic with bipolar. The robot was docked in the normal manner.  The right and left peritoneum were opened parallel to the IP ligament to open the retroperitoneal spaces bilaterally. There was bilateral mapping of dye however, it was not safe to resect the dyed lymph nodes as there was such extreme visceral adiposity that the intestines could not be safely mobilized and retracted out of the surgical field. Therefore lymph node sampling was not attempted which was felt to be appropriate due to the low grade nature of her cancer on preop biopsy.  The hysterectomy was started after the round ligament on the right side was incised and the retroperitoneum was entered and the pararectal space was developed.  The ureter was noted to be on the medial leaf of the broad ligament.  The peritoneum above the ureter was incised and stretched and the infundibulopelvic ligament was skeletonized, cauterized and cut.  The posterior peritoneum was taken down to the level of the KOH ring.  The anterior peritoneum was also taken  down.  The bladder flap was created to the level of the KOH ring.  The uterine artery on the right side was skeletonized, cauterized and cut in the normal manner.  A similar procedure was performed on the left.  The colpotomy was made and the uterus, cervix, bilateral ovaries and tubes were amputated and delivered through the vagina.  Pedicles were inspected and excellent hemostasis was achieved.  The hysterectomy procedure lasted 30 minutes longer than a typical hysterectomy procedure due to the patient's extreme visceral adiposity which meant that there was significant bowel crowding the field which required frequent and repetative manipulation to mobilize away from the surgical field.   The colpotomy at the vaginal cuff was closed with Vicryl on a CT1 needle in running manner.  Irrigation was used and excellent hemostasis was achieved.  At this point in the procedure was completed.  Robotic instruments were removed under direct visulaization.  The robot was undocked. The 10 mm ports were closed with Vicryl on a UR-5 needle and the fascia was closed with 0 Vicryl on a UR-5 needle.  The skin was closed with 4-0 Vicryl in a subcuticular manner.  Dermabond was applied.  Sponge, lap and needle counts correct x  2. The vagina was swabbed with sp,e bleeding noted from the posterior introitus. This was made hemostatic with a running 2-0 vicryl suture.   All instrument and needle counts were correct x  3.   The patient was transferred to the recovery room in a stable condition.  Donaciano Eva, MD

## 2018-02-19 NOTE — Progress Notes (Signed)
Report called to [place] on 5W

## 2018-02-19 NOTE — Progress Notes (Signed)
Relief report to Kandis Nab RN

## 2018-02-20 ENCOUNTER — Encounter (HOSPITAL_BASED_OUTPATIENT_CLINIC_OR_DEPARTMENT_OTHER): Payer: Self-pay | Admitting: Gynecologic Oncology

## 2018-02-20 ENCOUNTER — Telehealth: Payer: Self-pay | Admitting: Gynecologic Oncology

## 2018-02-20 DIAGNOSIS — Z6841 Body Mass Index (BMI) 40.0 and over, adult: Secondary | ICD-10-CM | POA: Diagnosis not present

## 2018-02-20 DIAGNOSIS — N83201 Unspecified ovarian cyst, right side: Secondary | ICD-10-CM | POA: Diagnosis not present

## 2018-02-20 DIAGNOSIS — Z7982 Long term (current) use of aspirin: Secondary | ICD-10-CM | POA: Diagnosis not present

## 2018-02-20 DIAGNOSIS — C541 Malignant neoplasm of endometrium: Secondary | ICD-10-CM

## 2018-02-20 DIAGNOSIS — N83202 Unspecified ovarian cyst, left side: Secondary | ICD-10-CM | POA: Diagnosis not present

## 2018-02-20 LAB — CBC
HCT: 31.8 % — ABNORMAL LOW (ref 36.0–46.0)
HEMOGLOBIN: 9.7 g/dL — AB (ref 12.0–15.0)
MCH: 30 pg (ref 26.0–34.0)
MCHC: 30.5 g/dL (ref 30.0–36.0)
MCV: 98.5 fL (ref 80.0–100.0)
Platelets: 203 10*3/uL (ref 150–400)
RBC: 3.23 MIL/uL — AB (ref 3.87–5.11)
RDW: 14.1 % (ref 11.5–15.5)
WBC: 9.9 10*3/uL (ref 4.0–10.5)
nRBC: 0 % (ref 0.0–0.2)

## 2018-02-20 LAB — BASIC METABOLIC PANEL
ANION GAP: 10 (ref 5–15)
BUN: 23 mg/dL (ref 8–23)
CHLORIDE: 104 mmol/L (ref 98–111)
CO2: 24 mmol/L (ref 22–32)
Calcium: 8.6 mg/dL — ABNORMAL LOW (ref 8.9–10.3)
Creatinine, Ser: 1.02 mg/dL — ABNORMAL HIGH (ref 0.44–1.00)
GFR calc Af Amer: >60 mL/min (ref >60)
GFR, EST NON AFRICAN AMERICAN: 56 mL/min — AB (ref >60)
GLUCOSE: 255 mg/dL — AB (ref 70–99)
POTASSIUM: 5 mmol/L (ref 3.5–5.1)
Sodium: 138 mmol/L (ref 135–145)

## 2018-02-20 LAB — GLUCOSE, CAPILLARY
GLUCOSE-CAPILLARY: 177 mg/dL — AB (ref 70–99)
GLUCOSE-CAPILLARY: 186 mg/dL — AB (ref 70–99)

## 2018-02-20 MED ORDER — SENNOSIDES-DOCUSATE SODIUM 8.6-50 MG PO TABS: 2.0000 | ORAL_TABLET | Freq: Every day | ORAL | 1 refills | Status: DC

## 2018-02-20 NOTE — Discharge Instructions (Signed)
02/19/2018  Return to work: 4-6 weeks   Activity: 1. Be up and out of the bed during the day.  Take a nap if needed.  You may walk up steps but be careful and use the hand rail.  Stair climbing will tire you more than you think, you may need to stop part way and rest.   2. No lifting or straining for 6 weeks.  3. No driving for 4 weeks.  Do Not drive if you are taking narcotic pain medicine.  4. Shower daily.  Use soap and water on your incision and pat dry; don't rub.   5. No sexual activity and nothing in the vagina for 8 weeks.  Medications:  - Take tylenol first line for pain control. Take these regularly (every 6 hours) to decrease the build up of pain.  - If necessary, for severe pain not relieved by tylenol, take oxycodone.  - While taking oxycodone you should take sennakot every night to reduce the likelihood of constipation. If this causes diarrhea, stop its use.  Diet: 1. Low sodium Heart Healthy Diet is recommended.  2. It is safe to use a laxative if you have difficulty moving your bowels.   Wound Care: 1. Keep clean and dry.  Shower daily.  Reasons to call the Doctor:   Fever - Oral temperature greater than 100.4 degrees Fahrenheit  Foul-smelling vaginal discharge  Difficulty urinating  Nausea and vomiting  Increased pain at the site of the incision that is unrelieved with pain medicine.  Difficulty breathing with or without chest pain  New calf pain especially if only on one side  Sudden, continuing increased vaginal bleeding with or without clots.   Follow-up: 1. See Everitt Amber in 3-4 weeks.  Contacts: For questions or concerns you should contact:  Dr. Everitt Amber at [phone] After hours and on week-ends call [phone] and ask to speak to the physician on call for Gynecologic Oncology

## 2018-02-20 NOTE — Telephone Encounter (Signed)
Informed patient of results of stage IIIA endometroid endometrial carcinoma.  Recommendation is for chemotherapy and radiation. Would prefer 6 cycles of carb/tax with vaginal brachytherapy. Not sure that external beam will be a good option with her substantial lower abdominal skin folds. Will have Dr Sondra Come consider.   Will make referrals to med onc and rad onc.  Will schedule CT chest/abd/pelvis.  Thereasa Solo, MD

## 2018-02-20 NOTE — Discharge Summary (Signed)
Physician Discharge Summary  Patient ID: [name] MRN: [id number] DOB/AGE: 1951-04-04 67 y.o.  Admit date: 02/19/2018 Discharge date: 02/20/2018  Admission Diagnoses: Endometrial cancer [facility])  Discharge Diagnoses:  Principal Problem:   Endometrial cancer [facility]) Active Problems:   Diabetes mellitus ([place])   Morbid obesity ([place])   Discharged Condition:  The patient is in good condition and stable for discharge.    Hospital Course: On 02/19/2018, the patient underwent the following: Procedure(s): XI ROBOTIC ASSISTED TOTAL HYSTERECTOMY WITH BILATERAL SALPINGO OOPHORECTOMY [facility] LYMPH NODE BIOPSY.  The postoperative course was uneventful.  She was discharged to home on postoperative day 1 tolerating a regular diet, ambulating, voiding, pain controlled, passing flatus.  Consults: None  Significant Diagnostic Studies: None  Treatments: surgery: see above  Discharge Exam: Blood pressure (!) 139/51, pulse 68, temperature 97.6 F (36.4 C), temperature source Oral, resp. rate 15, height 5\' 1"  (1.549 m), weight 286 lb 9.6 oz (130 kg), last menstrual period 08/10/2010, SpO2 95 %. General appearance: alert, cooperative, appears stated age and no distress Resp: clear to auscultation bilaterally Cardio: regular rate and rhythm, S1, S2 normal, no murmur, click, rub or gallop GI: soft, non-tender; bowel sounds normal; no masses,  no organomegaly and abdomen morbidly obese Extremities: extremities normal, atraumatic, no cyanosis or edema Incision/Wound: Lap sites to the abdomen with dermabond without drainage and intact  Disposition: Discharge disposition: 01-Home or Self Care       Discharge Instructions    Call MD for:  difficulty breathing, headache or visual disturbances   Complete by:  As directed    Call MD for:  extreme fatigue   Complete by:  As directed    Call MD for:  hives   Complete by:  As directed    Call MD for:  persistant dizziness or light-headedness    Complete by:  As directed    Call MD for:  persistant nausea and vomiting   Complete by:  As directed    Call MD for:  redness, tenderness, or signs of infection (pain, swelling, redness, odor or green/yellow discharge around incision site)   Complete by:  As directed    Call MD for:  severe uncontrolled pain   Complete by:  As directed    Call MD for:  temperature >100.4   Complete by:  As directed    Diet - low sodium heart healthy   Complete by:  As directed    Driving Restrictions   Complete by:  As directed    No driving for 1 week.  Do not take narcotics and drive.   Increase activity slowly   Complete by:  As directed    Lifting restrictions   Complete by:  As directed    No lifting greater than 10 lbs.   Sexual Activity Restrictions   Complete by:  As directed    No sexual activity, nothing in the vagina, for 8 weeks.     Allergies as of 02/20/2018      Reactions   Adhesive [tape] Other (See Comments)   Takes patient's skin off.   Banana Diarrhea, Nausea Only   Eggs Or Egg-derived Products Diarrhea, Nausea Only   Latex Other (See Comments)   sensativity   Penicillins Rash, Other (See Comments)   Has had cephalosporins without incident   Pine Swelling, Rash   Rose Swelling, Rash      Medication List    STOP taking these medications   aspirin 81 MG tablet  TAKE these medications   albuterol 108 (90 Base) MCG/ACT inhaler Commonly known as:  PROVENTIL HFA;VENTOLIN HFA Inhale 2 puffs into lungs every 6 hours as needed for wheezing or shortness of breath Dx: J45.50   albuterol (2.5 MG/3ML) 0.083% nebulizer solution Commonly known as:  PROVENTIL Take 3 mLs (2.5 mg total) by nebulization every 4 (four) hours as needed for wheezing or shortness of breath (Dx: J45.50).   clindamycin 150 MG capsule Commonly known as:  CLEOCIN Take 150 mg by mouth every 6 (six) hours.   clobetasol ointment 0.05 % Commonly known as:  TEMOVATE APPLY TO AFFECTED AREA TWICE A  DAY What changed:  See the new instructions.   enalapril 10 MG tablet Commonly known as:  VASOTEC TAKE 1 TABLET BY MOUTH EVERY DAY   FLONASE SENSIMIST 27.5 MCG/SPRAY nasal spray Generic drug:  fluticasone Place 1 spray into the nose daily as needed for rhinitis.   fluticasone 0.05 % cream Commonly known as:  CUTIVATE Apply to affected area of R lower leg twice per day What changed:    how much to take  how to take this  when to take this  additional instructions   furosemide 20 MG tablet Commonly known as:  LASIX TAKE 1 TABLET BY MOUTH EVERY DAY   HYDROcodone-acetaminophen 5-325 MG tablet Commonly known as:  NORCO/VICODIN 1-2 tabs po qd prn pain   insulin aspart 100 UNIT/ML injection Commonly known as:  novoLOG USE PER SLIDING SCALE AT EACH MEAL (AVG USE IS 17 UNITS TID) What changed:    how much to take  how to take this  when to take this  additional instructions   insulin NPH Human 100 UNIT/ML injection Commonly known as:  HUMULIN N,NOVOLIN N 65 U SQ qAM, 65 U SQ mid day, and 61 U SQ q evening What changed:    how much to take  how to take this  when to take this  additional instructions   Insulin Syringes (Disposable) U-100 0.3 ML Misc Use to inject insulin--6 injections per day   levocetirizine 5 MG tablet Commonly known as:  XYZAL Take 5 mg by mouth at bedtime as needed for allergies.   levothyroxine 125 MCG tablet Commonly known as:  SYNTHROID, LEVOTHROID TAKE 1 TABLET BY MOUTH EVERY MORNING ON AN EMPTY STOMACH What changed:  See the new instructions.   meloxicam 15 MG tablet Commonly known as:  MOBIC TAKE 1 TABLET BY MOUTH EVERY DAY AS NEEDED What changed:    how much to take  how to take this  when to take this  reasons to take this  additional instructions   metFORMIN 500 MG tablet Commonly known as:  GLUCOPHAGE TAKE 1 TABLET BY MOUTH EVERY MORNING AND 2 TABLETS BY MOUTH AT BEDTIME What changed:  See the new  instructions.   MICRO GUARD 2 % powder Generic drug:  miconazole Apply 1 application topically 2 (two) times daily as needed for itching.   montelukast 10 MG tablet Commonly known as:  SINGULAIR Take 10 mg by mouth every evening.   pravastatin 20 MG tablet Commonly known as:  PRAVACHOL TAKE 1 TABLET BY MOUTH EVERY DAY   PROBIOTIC PO Take 1 capsule by mouth daily.   senna-docusate 8.6-50 MG tablet Commonly known as:  Senokot-S Take 2 tablets by mouth at bedtime. Hold if having loose stools   SYMBICORT 160-4.5 MCG/ACT inhaler Generic drug:  budesonide-formoterol Inhale 2 puffs into the lungs 2 (two) times daily.  Follow-up Information    Everitt Amber, MD Follow up on 03/14/2018.   Specialty:  Gynecologic Oncology Why:  at 9 am at the [facility] for follow up post-op Contact information: [address] [place] [address] [phone]           Greater than thirty minutes were spend for face to face discharge instructions and discharge orders/summary in EPIC.   Signed: Dorothyann Gibbs 02/20/2018, 10:28 AM

## 2018-02-20 NOTE — Progress Notes (Signed)
Discharge instructions given to pt and all questions were answered. Pt taken down via wheelchair and was picked up by her husband.  

## 2018-02-21 ENCOUNTER — Encounter: Payer: Self-pay | Admitting: Oncology

## 2018-02-21 ENCOUNTER — Telehealth: Payer: Self-pay | Admitting: Oncology

## 2018-02-21 DIAGNOSIS — C541 Malignant neoplasm of endometrium: Secondary | ICD-10-CM

## 2018-02-21 NOTE — Telephone Encounter (Signed)
[name] with appointment for CT scan on 03/12/18 (discussed NPO 4 hours before, drink 1st bottle of contrast at 7:30 am, 2nd bottle at 8:30 am), Dr. Sondra Come on 03/12/18 and Dr. Alvy Bimler on 03/13/18.  She verbalized understanding and agreement.

## 2018-02-22 ENCOUNTER — Telehealth: Payer: Self-pay | Admitting: Oncology

## 2018-02-22 ENCOUNTER — Other Ambulatory Visit: Payer: Self-pay | Admitting: Family Medicine

## 2018-02-22 NOTE — Telephone Encounter (Signed)
[name] called and had multiple questions about her cancer.  Discussed staging and why she needs chemotherapy and radiation. She also said she is due to for a colonoscopy and is wondering if she should have it before or after chemo and if her CT scan and appointments can be moved up. Discussed with Joylene John, NP and she said patient would be unable to have a colonoscopy now because she would be unable to lay on her stomach due to surgery.  Advised patient that we will discuss this with Dr. Alvy Bimler as well. Also rescheduled CT to 03/05/18 and Dr. Clabe Seal appointment to 03/07/18 per Joylene John, NP.  Patient advised of appointment changes.

## 2018-02-28 ENCOUNTER — Telehealth: Payer: Self-pay | Admitting: *Deleted

## 2018-02-28 ENCOUNTER — Telehealth: Payer: Self-pay | Admitting: Oncology

## 2018-02-28 NOTE — Telephone Encounter (Signed)
[name] called and said that her far right incision looks like it has opened up a little, less than 1/4 inch.  She said it is redder around than her other incisions and had a small amount of red/yellow drainage.  She denies having a fever.  She is wondering if she needs to put anything on it.  Advised her to cover it with a band-aid and that I will check with Gyn Onc to see if she will need to be seen.

## 2018-02-28 NOTE — Telephone Encounter (Signed)
Returned patient's call concerning her incision.  Per Joylene John, NP keep the band aid on it for tonignt and continue to monitor and we would like to see you in the office for a wound check.  Patient verbalized understanding.

## 2018-03-01 ENCOUNTER — Encounter: Payer: Self-pay | Admitting: Gynecologic Oncology

## 2018-03-01 ENCOUNTER — Telehealth: Payer: Self-pay | Admitting: Oncology

## 2018-03-01 ENCOUNTER — Encounter: Payer: Self-pay | Admitting: Oncology

## 2018-03-01 ENCOUNTER — Inpatient Hospital Stay (HOSPITAL_BASED_OUTPATIENT_CLINIC_OR_DEPARTMENT_OTHER): Payer: Medicare Other | Admitting: Gynecologic Oncology

## 2018-03-01 VITALS — BP 163/73 | HR 97 | Temp 97.9°F | Resp 18 | Ht 61.0 in | Wt 287.2 lb

## 2018-03-01 DIAGNOSIS — R239 Unspecified skin changes: Secondary | ICD-10-CM

## 2018-03-01 DIAGNOSIS — Z90710 Acquired absence of both cervix and uterus: Secondary | ICD-10-CM

## 2018-03-01 DIAGNOSIS — Z90722 Acquired absence of ovaries, bilateral: Secondary | ICD-10-CM

## 2018-03-01 DIAGNOSIS — C541 Malignant neoplasm of endometrium: Secondary | ICD-10-CM

## 2018-03-01 DIAGNOSIS — T8131XA Disruption of external operation (surgical) wound, not elsewhere classified, initial encounter: Secondary | ICD-10-CM

## 2018-03-01 NOTE — Progress Notes (Signed)
Called [name]'s dentist office, [facility] to see if her appointment to have her tooth pulled on 03/26/18 can be moved up since she will be starting chemotherapy in December.  They said he does not have any openings but will put her on the cancellation list.  Discussed with [name] and she would like to see if she can make an appointment with Dr. Enrique Sack at [facility].  Called Dr. Ritta Slot office and talked with Joellen Jersey.  A referral has been entered and Joellen Jersey will give it to Dr. Enrique Sack to review tomorrow.

## 2018-03-01 NOTE — Patient Instructions (Signed)
If the incision with the superficial opening reopens after the reapplication of dermabond, you can place a dry gauze dressing over the area and change daily.  If you notice an increase in redness, drainage, increased warmth, fever, chills or any changes from how you are feeling now, please call our office.  Our office will also reach out to your dentist about moving your appointment for tooth extraction up so this can be taken care of before starting treatment.  Plan to follow up with Dr. Denman George as scheduled or sooner if needed.

## 2018-03-01 NOTE — Telephone Encounter (Signed)
[name] called and said her dentist was able to work her in on 03/06/18 to have the tooth pulled.  Advised her that we will cancel the referral to Dr. Enrique Sack.  Left a message with Katie in Dentistry.

## 2018-03-02 ENCOUNTER — Encounter: Payer: Self-pay | Admitting: Gynecologic Oncology

## 2018-03-02 NOTE — Progress Notes (Signed)
Patient presents to the office for evaluation of her abdominal incision.  She called the office the day before stating that the scab fell off the incision and it appear slightly open and red.  Minimal amount of red/yellow drainage reported. No fever or chills reported.  She reports doing well at home.  Tolerating diet with no nausea or emesis.  Ambulating without difficulty.  Bowels and bladder functioning without difficulty. Pain minimal. No other concerns voiced.  O: Alert, oriented x 3 Abdomen obese and round. All lap sites with mild erythema around the incisions but no evidence of infection.  No drainage noted.  The right abdominal lap site is slightly open superficially. Deeper layers intact. No drainage noted.  Incision approximated and dermabond glue applied to the incision without difficulty.  Patient tolerated well.  Incision remaining closed.  A: Superficial opening of lap site incision  P: Dermabond reapplied.  Patient advised that if the dermabond did not hold the incision together, she could place a dry gauze over the area with paper tape.  Reportable signs and symptoms reviewed.  Supplies given.  She is advised to contact the office for any needs and to follow up as planned.  She states she needs to have a tooth pulled but her dentist cannot get her in until Dec 16.  Advised our office would work on getting her appt moved up or to see another provider since chemotherapy will be starting around that time or before. No concerns voiced at end of visit.

## 2018-03-03 ENCOUNTER — Other Ambulatory Visit: Payer: Self-pay | Admitting: Family Medicine

## 2018-03-04 NOTE — Progress Notes (Signed)
GYN Location of Tumor / Histology: stage IIIA endometroid endometrial carcinoma.  [name] presented with symptoms of: 01/25/18 here for pelvic ultrasound due to PMP bleeding.  Pt reports this started in July with a little spotting for a few days.  She did not have any pelvic pain or cramping.  Bleeding stopped.  She was not seen at this time.  Then spotting started about two weeks ago.  She has continued to spot and only noted this with wiping until today when she did notice some red in the toilet.    Pt has hx of complex endometrial hyperplasia without atypica that was treated with Mirena IUD for 5 years.  This was removed about two years ago.  She did have follow up biopsies after placement and did have resolution of hyperplasia.    Patient's last menstrual period was 08/10/2010.  Biopsies revealed: 02/19/18:  Diagnosis Uterus +/- tubes/ovaries, neoplastic, cervix - UTERUS: -ENDO/MYOMETRIUM: INVASIVE ENDOMETRIOID ADENOCARCINOMA WITH FOCAL SQUAMOUS DIFFERENTIATION (FIGO GRADE 1), SPANNING 5.8 CM. TUMOR INVADES LESS THAN ONE HALF OF THE MYOMETRIUM. TUMOR INVOLVES CERVICAL STROMA AND BILATERAL FALLOPIAN TUBE LUMEN. SEE ONCOLOGY TABLE. -SEROSA: UNREMARKABLE. NO MALIGNANCY. - CERVIX: STROMAL INVOLVEMENT BY ENDOMETRIOID ADENOCARCINOMA. - BILATERAL OVARIES: INCLUSION CYSTS. NO MALIGNANCY. - BILATERAL FALLOPIAN TUBES: LUMEN INVOLVEMENT BY ENDOMETRIOID ADENOCARCINOMA.  Past/Anticipated interventions by Gyn/Onc surgery, if any: 02/19/18:  Operation: Robotic-assisted laparoscopic total hysterectomy with bilateral salpingoophorectomy (22 modifier for extreme obesity - see details in body of operative note)   Surgeon: Donaciano Eva  Past/Anticipated interventions by medical oncology, if any: Initial consult with Dr. Alvy Bimler 03/13/18  Weight changes, if any: No. Pt is obese.   Bowel/Bladder complaints, if any: Pt denies dysuria/hematuria. Pt reports intermittent diarrhea.    Nausea/Vomiting, if any: Pt denies N/V or abdominal bloating.  Pain issues, if any:  Pt denies c/o pain related to cancer or surgery. Pt does have arthritis-related pain.   SAFETY ISSUES:  Prior radiation? No  Pacemaker/ICD? No  Possible current pregnancy? N/A, pt is post-surgical  Is the patient on methotrexate? No  Current Complaints / other details:  Pt presents today for initial consult with Dr. Sondra Come for Radiation Oncology. Pt is accompanied by daughter, [name], who is pt's HCPOA. Pt with multiple questions regarding CT scan and treatment plan.   BP (!) 158/51 (BP Location: Left Arm, Patient Position: Sitting)   Pulse 89   Temp 98.2 F (36.8 C) (Oral)   Resp (!) 24   Ht 5\' 1"  (1.549 m)   Wt 284 lb (128.8 kg)   LMP 08/10/2010 Comment: Hysterectomy 02/02/18  SpO2 97%   BMI 53.66 kg/m   Wt Readings from Last 3 Encounters:  03/07/18 284 lb (128.8 kg)  03/01/18 287 lb 3 oz (130.3 kg)  02/19/18 286 lb 9.6 oz (130 kg)   Loma Sousa, RN BSN

## 2018-03-05 ENCOUNTER — Encounter: Payer: Self-pay | Admitting: Oncology

## 2018-03-05 ENCOUNTER — Ambulatory Visit (HOSPITAL_COMMUNITY)
Admission: RE | Admit: 2018-03-05 | Discharge: 2018-03-05 | Disposition: A | Discharge status: Home / Self Care | Payer: Medicare Other | Source: Ambulatory Visit | Attending: Gynecologic Oncology | Admitting: Gynecologic Oncology

## 2018-03-05 DIAGNOSIS — C541 Malignant neoplasm of endometrium: Secondary | ICD-10-CM | POA: Diagnosis not present

## 2018-03-05 MED ORDER — IOHEXOL 300 MG/ML  SOLN
100.0000 mL | Freq: Once | INTRAMUSCULAR | Status: AC | PRN
  Administered 2018-03-05: 100 mL via INTRAVENOUS

## 2018-03-05 MED ORDER — SODIUM CHLORIDE (PF) 0.9 % IJ SOLN
INTRAMUSCULAR | Status: AC
  Filled 2018-03-05: qty 50

## 2018-03-05 NOTE — Progress Notes (Signed)
Gynecologic Oncology Multi-Disciplinary Disposition Conference Note  Date of the Conference: 03/05/2018  Patient Name: [name]  Referring Provider: Primary GYN Oncologist:  Stage/Disposition:  Stave IIIA endometroid endometrial carcinoma. Disposition is to chemotherapy and vaginal brachytherapy.   This Multidisciplinary conference took place involving physicians from [place], [place], Radiation Oncology, Pathology, Radiology along with the Gynecologic Oncology Nurse Practitioner and RN.  Comprehensive assessment of the patient's malignancy, staging, need for surgery, chemotherapy, radiation therapy, and need for further testing were reviewed. Supportive measures, both inpatient and following discharge were also discussed. The recommended plan of care is documented. Greater than 35 minutes were spent correlating and coordinating this patient's care.

## 2018-03-07 ENCOUNTER — Other Ambulatory Visit: Payer: Self-pay

## 2018-03-07 ENCOUNTER — Ambulatory Visit
Admission: RE | Admit: 2018-03-07 | Discharge: 2018-03-07 | Disposition: A | Discharge status: Home / Self Care | Payer: Medicare Other | Source: Ambulatory Visit | Attending: Radiation Oncology | Admitting: Radiation Oncology

## 2018-03-07 ENCOUNTER — Encounter: Payer: Self-pay | Admitting: Radiation Oncology

## 2018-03-07 VITALS — BP 158/51 | HR 89 | Temp 98.2°F | Resp 24 | Ht 61.0 in | Wt 284.0 lb

## 2018-03-07 DIAGNOSIS — E119 Type 2 diabetes mellitus without complications: Secondary | ICD-10-CM | POA: Diagnosis not present

## 2018-03-07 DIAGNOSIS — Z8742 Personal history of other diseases of the female genital tract: Secondary | ICD-10-CM | POA: Diagnosis not present

## 2018-03-07 DIAGNOSIS — Z90711 Acquired absence of uterus with remaining cervical stump: Secondary | ICD-10-CM | POA: Diagnosis not present

## 2018-03-07 DIAGNOSIS — C541 Malignant neoplasm of endometrium: Secondary | ICD-10-CM

## 2018-03-07 DIAGNOSIS — Z794 Long term (current) use of insulin: Secondary | ICD-10-CM | POA: Diagnosis not present

## 2018-03-07 DIAGNOSIS — Z79899 Other long term (current) drug therapy: Secondary | ICD-10-CM | POA: Diagnosis not present

## 2018-03-07 NOTE — Progress Notes (Signed)
Radiation Oncology         [phone] ________________________________  Initial Outpatient Consultation  Name: [name] MRN: [id number]  Date: 03/07/2018  DOB: Dec 04, 1950  CC:McGowen, Adrian Blackwater, MD  Everitt Amber, MD   REFERRING PHYSICIAN: Everitt Amber, MD  DIAGNOSIS: Stage IIIA endometroid endometrial carcinoma.   The encounter diagnosis was Endometrial cancer ([place]).  HISTORY OF PRESENT ILLNESS::[name] is a 67 y.o. female who presented with post-menopausal bleeding which started in July 2019 "with a little spotting for a few days". She did not have any pelvic pain or cramping at the time. Bleeding then resolved and restarted again two weeks later. Accordingly the patient underwent pelvic ultrasound and biopsy on 01/25/2018. Biopsy of the endometrium revealed endometrioid adenocarcinoma, FIGO grade 1.  Patient underwent total hysterectomy with bilateral salpingo oophorectomy on 02/19/2018 with Dr. Denman George. Final pathology revealed invasive endometrioid adenocarcinoma within the endo/myometrium with focal squamous differentiation (FIGO grade 1) spanning 5.8 cm. Tumor invades less than one half of the myometrium. Tumor involves cervical stroma and bilateral fallopian tube lumen. Serosa is unremarkable with no malignancy. Cervix shows stromal involvement by endometrioid adenocarcinoma. Bilateral ovaries show inclusion cysts with no malignancy. Bilateral fallopian tubes show lumen involvement by endometrioid adenocarcinoma.  CT C/A/P on 03/05/2018 showed upper normal pelvic sidewall lymph nodes bilaterally. No other findings to suggest metastatic disease in the chest, abdomen, or pelvis.  Patient's last menstrual period was 08/10/2010.  Patient does have a history of complex endometrial hyperplasia without atypica that was treated with Mirena IUD for 5 years. This was removed about two years ago. She did have follow up biopsies after placement and did have resolution of  hyperplasia.  The patient is scheduled for consultation with oncologist Dr. Alvy Bimler on 03/13/2018 and scheduled for follow-up with Dr. Denman George on 03/14/2018.   The patient is here for further evaluation and discussion of radiation treatment options in the management of her disease.   PREVIOUS RADIATION THERAPY: No  PAST MEDICAL HISTORY:  has a past medical history of Allergic rhinitis, Anemia, Arthritis, Complex endometrial hyperplasia with atypia (12/12), Cough variant asthma, DDD (degenerative disc disease), lumbar, Diabetes mellitus with complication ([place]), Encounter for insertion of mirena IUD (5/12), Endometrial cancer ([place]) (02/2018), Family history of adverse reaction to anesthesia, Fracture, foot (8/14), GERD (gastroesophageal reflux disease), Hemorrhoids, Herpes zoster (10/2015), History of adenomatous polyp of colon (02/25/13), History of blood transfusion, History of cellulitis (08/2010), History of recent dental procedure (02/2018), Hyperlipidemia, Hypertension, Hypothyroidism, Nephrolithiasis (4/07), Obesity, OSA on CPAP, Osteoarthritis, Sciatica of left side, Severe persistent asthma, Systolic murmur, UTI (lower urinary tract infection), and Zoon's vulvitis.    PAST SURGICAL HISTORY: Past Surgical History:  Procedure Laterality Date  . ANKLE FRACTURE SURGERY  1984   Pin & repair  . ARTHROSCOPIC REPAIR ACL  2000   Due to ACL tear  . ARTHROSCOPIC REPAIR ACL  5/08  . Blateral knee rerlacements x4 2 times each knee    . BREAST BIOPSY Right 2014   Benign  . CARDIAC CATHETERIZATION  5/07   clear vessel mild mitral   . CARPAL TUNNEL RELEASE Right [id number]   1989 Left  . CESAREAN SECTION  1985  . CHOLECYSTECTOMY OPEN  1988  . COLONOSCOPY N/A 02/25/2013   Tubular adenoma x 1: Recall 5 yrs. Procedure: COLONOSCOPY;  Surgeon: Juanita Craver, MD;  Location: [facility];  Service: Endoscopy;  Laterality: N/A;  . COMBINED HYSTEROSCOPY DIAGNOSTIC / D&C  2/12   Bx neg  . DEXA  08/2007    Bone density normal.  . DILATION AND CURETTAGE OF UTERUS  12/11  . LYMPH NODE BIOPSY N/A 02/19/2018   Procedure: Sentinel LYMPH NODE BIOPSY;  Surgeon: Everitt Amber, MD;  Location: [facility];  Service: Gynecology;  Laterality: N/A;  . REPLACEMENT TOTAL KNEE Left 5/12   X 2 on each  . ROBOTIC ASSISTED TOTAL HYSTERECTOMY WITH BILATERAL SALPINGO OOPHERECTOMY N/A 02/19/2018   For endometrial cancer.  Procedure: XI ROBOTIC ASSISTED TOTAL HYSTERECTOMY WITH BILATERAL SALPINGO OOPHORECTOMY;  Surgeon: Everitt Amber, MD;  Location: [place];  Service: Gynecology;  Laterality: N/A;  . TRANSTHORACIC ECHOCARDIOGRAM  10/11/2016    EF 55-60%, grd I DD.  . TUBAL LIGATION  1985   C-Section    FAMILY HISTORY: family history includes Alzheimer's disease in her mother; Arthritis in her mother; Breast cancer in her other; COPD in her father; Celiac disease in her brother; Diabetes in her brother and father; Other in her son; Rheum arthritis in her brother; Stroke in her father; Thyroid disease in her brother.  SOCIAL HISTORY:  reports that she has never smoked. She has never used smokeless tobacco. She reports that she does not drink alcohol or use drugs. 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  ALLERGIES: Adhesive [tape]; Banana; Eggs or egg-derived products; Latex; Penicillins; Pine; and Rose  MEDICATIONS:  Current Outpatient Medications  Medication Sig Dispense Refill  . albuterol (PROVENTIL HFA;VENTOLIN HFA) 108 (90 Base) MCG/ACT inhaler Inhale 2 puffs into lungs every 6 hours as needed for wheezing or shortness of breath Dx: J45.50 18 Inhaler 3  . albuterol (PROVENTIL) (2.5 MG/3ML) 0.083% nebulizer solution Take 3 mLs (2.5 mg total) by nebulization every 4 (four) hours as needed for wheezing or shortness of breath (Dx: J45.50). 75 mL 1  . clobetasol ointment (TEMOVATE) 0.05 % APPLY TO AFFECTED AREA TWICE A DAY (Patient taking differently: Apply  1 application topically 2 (two) times daily as needed (for irritation). ) 60 g 1  . enalapril (VASOTEC) 10 MG tablet TAKE 1 TABLET BY MOUTH EVERY DAY (Patient taking differently: Take 10 mg by mouth daily. ) 90 tablet 1  . fluticasone (CUTIVATE) 0.05 % cream Apply to affected area of R lower leg twice per day (Patient taking differently: Apply 1 application topically See admin instructions. Apply to affected area of R lower leg twice per day as needed for irritation) 30 g 2  . fluticasone (FLONASE SENSIMIST) 27.5 MCG/SPRAY nasal spray Place 1 spray into the nose daily as needed for rhinitis.    . furosemide (LASIX) 20 MG tablet TAKE 1 TABLET BY MOUTH EVERY DAY (Patient taking differently: Take 20 mg by mouth daily. ) 90 tablet 1  . HYDROcodone-acetaminophen (NORCO/VICODIN) 5-325 MG tablet 1-2 tabs po qd prn pain 60 tablet 0  . insulin aspart (NOVOLOG) 100 UNIT/ML injection USE PER SLIDING SCALE AT EACH MEAL (AVG USE IS 17 UNITS THREE TIMES DAILY) 50 mL 2  . insulin NPH Human (NOVOLIN N) 100 UNIT/ML injection 65 U SQ qAM, 65 U SQ mid day, and 61 U SQ q evening (Patient taking differently: Inject 60 Units into the skin 3 (three) times daily. ) 50 mL 5  . Insulin Syringes, Disposable, U-100 0.3 ML MISC Use to inject insulin--6 injections per day 540 each 3  . levocetirizine (XYZAL) 5 MG tablet Take 5 mg by mouth at bedtime as needed for allergies.   5  . levothyroxine (SYNTHROID, LEVOTHROID) 125 MCG  tablet TAKE 1 TABLET BY MOUTH EVERY MORNING ON AN EMPTY STOMACH (Patient taking differently: Take 125 mcg by mouth daily before breakfast. ) 90 tablet 1  . meloxicam (MOBIC) 15 MG tablet TAKE 1 TABLET BY MOUTH EVERY DAY AS NEEDED (Patient taking differently: Take 15 mg by mouth daily as needed for pain. ) 30 tablet 3  . metFORMIN (GLUCOPHAGE) 500 MG tablet TAKE 1 TABLET BY MOUTH EVERY MORNING AND 2 TABLETS BY MOUTH AT BEDTIME (Patient taking differently: Take 500-1,000 mg by mouth See admin instructions. Take  500 mg by mouth in the morning and take 1000 mg by mouth at supper) 270 tablet 1  . miconazole (MICRO GUARD) 2 % powder Apply 1 application topically 2 (two) times daily as needed for itching.    . montelukast (SINGULAIR) 10 MG tablet Take 10 mg by mouth every evening.     . pravastatin (PRAVACHOL) 20 MG tablet TAKE 1 TABLET BY MOUTH EVERY DAY (Patient taking differently: Take 20 mg by mouth daily. ) 90 tablet 2  . Probiotic Product (PROBIOTIC PO) Take 1 capsule by mouth daily.    Marland Kitchen senna-docusate (SENOKOT-S) 8.6-50 MG tablet Take 2 tablets by mouth at bedtime. Hold if having loose stools 30 tablet 1  . SYMBICORT 160-4.5 MCG/ACT inhaler Inhale 2 puffs into the lungs 2 (two) times daily.    . clindamycin (CLEOCIN) 150 MG capsule Take 150 mg by mouth every 6 (six) hours.     No current facility-administered medications for this encounter.     REVIEW OF SYSTEMS:  REVIEW OF SYSTEMS: A 10+ POINT REVIEW OF SYSTEMS WAS OBTAINED including neurology, dermatology, psychiatry, cardiac, respiratory, lymph, extremities, GI, GU, musculoskeletal, constitutional, reproductive, HEENT. All pertinent positives are noted in the HPI. All others are negative. On review of systems, the patient reports that she is doing well overall. She denies any chest pain, shortness of breath, cough, fevers, chills, night sweats, unintended weight changes. She denies bladder disturbances, and denies abdominal pain, nausea or vomiting. She does report intermittent diarrhea. She denies dysuria or hematuria. She denies any pain related to cancer or surgery. She does report arthritis-related pain. A complete review of systems is obtained and is otherwise negative.   PHYSICAL EXAM:  height is 5\' 1"  (1.549 m) and weight is 284 lb (128.8 kg). Her oral temperature is 98.2 F (36.8 C). Her blood pressure is 158/51 (abnormal) and her pulse is 89. Her respiration is 24 (abnormal) and oxygen saturation is 97%.   General: Alert and oriented, in no  acute distress. She is accompanied by her daughter, [name], today. Uses a cane to ambulate. HEENT: Head is normocephalic. Extraocular movements are intact. Oropharynx is clear. Neck: Neck is supple, no palpable cervical or supraclavicular lymphadenopathy. Heart: Regular in rate and rhythm with no murmurs, rubs, or gallops. Chest: Clear to auscultation bilaterally, with no rhonchi, wheezes, or rales. Abdomen: Soft, nontender, nondistended, with no rigidity or guarding. Obese. Extremities: No cyanosis or edema. Wearing soft brace on right wrist for arthritis. Lymphatics: see Neck Exam Skin: No concerning lesions. Musculoskeletal: symmetric strength and muscle tone throughout. Neurologic: Cranial nerves II through XII are grossly intact. No obvious focalities. Speech is fluent. Coordination is intact. Psychiatric: Judgment and insight are intact. Affect is appropriate.  Pelvic exam deferred today in light of recent surgery.   ECOG = 0  0 - Asymptomatic (Fully active, able to carry on all predisease activities without restriction)  1 - Symptomatic but completely ambulatory (Restricted in physically strenuous  activity but ambulatory and able to carry out work of a light or sedentary nature. For example, light housework, office work)  2 - Symptomatic, <50% in bed during the day (Ambulatory and capable of all self care but unable to carry out any work activities. Up and about more than 50% of waking hours)  3 - Symptomatic, >50% in bed, but not bedbound (Capable of only limited self-care, confined to bed or chair 50% or more of waking hours)  4 - Bedbound (Completely disabled. Cannot carry on any self-care. Totally confined to bed or chair)  5 - Death   Eustace Pen MM, Creech RH, Tormey DC, et al. [phone]). "Toxicity and response criteria of the [facility]". [place] Oncol. 5 (6): 649-55  LABORATORY DATA:  Lab Results  Component Value Date   WBC 9.9 02/20/2018   HGB 9.7  (L) 02/20/2018   HCT 31.8 (L) 02/20/2018   MCV 98.5 02/20/2018   PLT 203 02/20/2018   NEUTROABS 5.0 10/04/2017   Lab Results  Component Value Date   NA 138 02/20/2018   K 5.0 02/20/2018   CL 104 02/20/2018   CO2 24 02/20/2018   GLUCOSE 255 (H) 02/20/2018   CREATININE 1.02 (H) 02/20/2018   CALCIUM 8.6 (L) 02/20/2018      RADIOGRAPHY: Ct Chest W Contrast  Result Date: 03/05/2018 CLINICAL DATA:  Endometrial cancer. Status post hysterectomy 02/19/2018 EXAM: CT CHEST, ABDOMEN, AND PELVIS WITH CONTRAST TECHNIQUE: Multidetector CT imaging of the chest, abdomen and pelvis was performed following the standard protocol during bolus administration of intravenous contrast. CONTRAST:  158mL OMNIPAQUE IOHEXOL 300 MG/ML  SOLN COMPARISON:  Chest CT 10/04/2016. FINDINGS: CT CHEST FINDINGS Cardiovascular: Heart size upper normal to mildly increased. No substantial pericardial effusion. Atherosclerotic calcification is noted in the wall of the thoracic aorta. Mediastinum/Nodes: No mediastinal lymphadenopathy. There is no hilar lymphadenopathy. The esophagus has normal imaging features. There is no axillary lymphadenopathy. Lungs/Pleura: The central tracheobronchial airways are patent. No suspicious pulmonary nodule or mass. No pleural effusion. Musculoskeletal: No worrisome lytic or sclerotic osseous abnormality. CT ABDOMEN PELVIS FINDINGS Hepatobiliary: No focal abnormality within the liver parenchyma. Gallbladder surgically absent. No intrahepatic or extrahepatic biliary dilation. Pancreas: Diffuse atrophy of pancreatic parenchyma without main ductal dilatation. Spleen: No splenomegaly. No focal mass lesion. Adrenals/Urinary Tract: No adrenal nodule or mass. Kidneys unremarkable. No evidence for hydroureter. The urinary bladder appears normal for the degree of distention. Stomach/Bowel: Stomach is nondistended. No gastric wall thickening. No evidence of outlet obstruction. Duodenum is normally positioned as is  the ligament of Treitz. No small bowel wall thickening. No small bowel dilatation. The terminal ileum is normal. No gross colonic mass. No colonic wall thickening. Vascular/Lymphatic: There is abdominal aortic atherosclerosis without aneurysm. There is no gastrohepatic or hepatoduodenal ligament lymphadenopathy. No intraperitoneal or retroperitoneal lymphadenopathy. Small para-aortic lymph nodes are evident. Borderline lymph nodes are seen in the left external iliac chain measuring 9 mm short axis (98/2) and 8 mm short axis (99/2). Similar small lymph nodes are seen on the right side. Reproductive: The uterus surgically absent. There is no adnexal mass. Other: No intraperitoneal free fluid. Musculoskeletal: Marked laxity of the anterior abdominal wall fascia evident. Degenerative changes noted lumbar spine. IMPRESSION: 1. Upper normal pelvic sidewall lymph nodes bilaterally. Close attention on follow-up recommended. 2. No other findings to suggest metastatic disease in the chest, abdomen, or pelvis. 3.  Aortic Atherosclerois (ICD10-170.0) Electronically Signed   By: Misty Stanley M.D.   On: 03/05/2018  14:24   Ct Abdomen Pelvis W Contrast  Result Date: 03/05/2018 CLINICAL DATA:  Endometrial cancer. Status post hysterectomy 02/19/2018 EXAM: CT CHEST, ABDOMEN, AND PELVIS WITH CONTRAST TECHNIQUE: Multidetector CT imaging of the chest, abdomen and pelvis was performed following the standard protocol during bolus administration of intravenous contrast. CONTRAST:  173mL OMNIPAQUE IOHEXOL 300 MG/ML  SOLN COMPARISON:  Chest CT 10/04/2016. FINDINGS: CT CHEST FINDINGS Cardiovascular: Heart size upper normal to mildly increased. No substantial pericardial effusion. Atherosclerotic calcification is noted in the wall of the thoracic aorta. Mediastinum/Nodes: No mediastinal lymphadenopathy. There is no hilar lymphadenopathy. The esophagus has normal imaging features. There is no axillary lymphadenopathy. Lungs/Pleura: The  central tracheobronchial airways are patent. No suspicious pulmonary nodule or mass. No pleural effusion. Musculoskeletal: No worrisome lytic or sclerotic osseous abnormality. CT ABDOMEN PELVIS FINDINGS Hepatobiliary: No focal abnormality within the liver parenchyma. Gallbladder surgically absent. No intrahepatic or extrahepatic biliary dilation. Pancreas: Diffuse atrophy of pancreatic parenchyma without main ductal dilatation. Spleen: No splenomegaly. No focal mass lesion. Adrenals/Urinary Tract: No adrenal nodule or mass. Kidneys unremarkable. No evidence for hydroureter. The urinary bladder appears normal for the degree of distention. Stomach/Bowel: Stomach is nondistended. No gastric wall thickening. No evidence of outlet obstruction. Duodenum is normally positioned as is the ligament of Treitz. No small bowel wall thickening. No small bowel dilatation. The terminal ileum is normal. No gross colonic mass. No colonic wall thickening. Vascular/Lymphatic: There is abdominal aortic atherosclerosis without aneurysm. There is no gastrohepatic or hepatoduodenal ligament lymphadenopathy. No intraperitoneal or retroperitoneal lymphadenopathy. Small para-aortic lymph nodes are evident. Borderline lymph nodes are seen in the left external iliac chain measuring 9 mm short axis (98/2) and 8 mm short axis (99/2). Similar small lymph nodes are seen on the right side. Reproductive: The uterus surgically absent. There is no adnexal mass. Other: No intraperitoneal free fluid. Musculoskeletal: Marked laxity of the anterior abdominal wall fascia evident. Degenerative changes noted lumbar spine. IMPRESSION: 1. Upper normal pelvic sidewall lymph nodes bilaterally. Close attention on follow-up recommended. 2. No other findings to suggest metastatic disease in the chest, abdomen, or pelvis. 3.  Aortic Atherosclerois (ICD10-170.0) Electronically Signed   By: Misty Stanley M.D.   On: 03/05/2018 14:24      IMPRESSION: Stage IIIA  endometroid endometrial carcinoma  The patient's case was discussed at the multidisciplinary gynecological conference earlier this week and recommendations were for adjuvant chemotherapy and vaginal brachytherapy. The patient did proceed with a CT scan of the chest, abdomen, and pelvis which did not show any evidence of metastatic disease. There were borderline in size pelvic lymph nodes but no obvious metastatic spread. Given recent randomized studies showing no survival benefit of adding external beam on top of adjuvant chemotherapy, external beam therapy is not recommended. Patient was not able to have a sentinel node procedure due to her body habitus, but scans as above did not show evidence of metastatic spread in the pelvis region.   Today, I talked to the patient and family about the findings and work-up thus far.  We discussed the natural history of endometrial cancer and general treatment, highlighting the role of vaginal brachytherapy in the management.  We discussed the available radiation techniques, and focused on the details of logistics and delivery.  We reviewed the anticipated acute and late sequelae associated with radiation in this setting. We also discussed the role of concurrent chemotherapy and radiotherapy. The patient was encouraged to ask questions that I answered to the best of my ability.  A patient consent form was discussed and signed.  We retained a copy for our records.  The patient would like to proceed with radiation and will be scheduled for CT simulation.  PLAN: Patient will proceed with adjuvant chemotherapy. We will plan on initiating vaginal brachytherapy during her second or third cycle of chemotherapy. Anticipate 5 intra-cavitary brachytherapy treatments with [id number].     ------------------------------------------------  Blair Promise, PhD, MD  This document serves as a record of services personally performed by Gery Pray, MD. It was created on his  behalf by Arlyce Harman, a trained medical scribe. The creation of this record is based on the scribe's personal observations and the provider's statements to them. This document has been checked and approved by the attending provider.

## 2018-03-09 ENCOUNTER — Encounter: Payer: Self-pay | Admitting: General Practice

## 2018-03-09 NOTE — Progress Notes (Signed)
[facility] [place] Psychosocial Distress Screening Clinical Social Work  Clinical Social Work was referred by distress screening protocol.  The patient scored a 5 on the Psychosocial Distress Thermometer which indicates moderate distress. Clinical Social Worker contacted patient by phone to assess for distress and other psychosocial needs. "Had a lot going on, feeling much better since Wednesday."  Conversations w treatment team have significantly relieved anxiety, aware of upcoming appointments.  Has strong support from husband and adult daughter, does not anticipate any current needs but glad to know [place] is available and will access as needed.    ONCBCN DISTRESS SCREENING 03/07/2018  Screening Type Initial Screening  Distress experienced in past week (1-10) 5  Emotional problem type Nervousness/Anxiety;Adjusting to illness  Information Concerns Type Lack of info about treatment  Physical Problem type Pain;Mouth sores/swallowing;Constipation/diarrhea;Skin dry/itchy    Clinical Social Worker follow up needed: No.  If yes, follow up plan:  Beverely Pace, [place], LCSW Clinical Social Worker Phone:  [phone]

## 2018-03-11 DIAGNOSIS — E875 Hyperkalemia: Secondary | ICD-10-CM

## 2018-03-11 HISTORY — DX: Hyperkalemia: E87.5

## 2018-03-12 ENCOUNTER — Ambulatory Visit: Payer: Medicare Other | Admitting: Radiation Oncology

## 2018-03-12 ENCOUNTER — Ambulatory Visit: Payer: Medicare Other

## 2018-03-12 ENCOUNTER — Ambulatory Visit (HOSPITAL_COMMUNITY): Payer: Medicare Other

## 2018-03-13 ENCOUNTER — Telehealth: Payer: Self-pay | Admitting: Hematology and Oncology

## 2018-03-13 ENCOUNTER — Encounter: Payer: Self-pay | Admitting: Oncology

## 2018-03-13 ENCOUNTER — Encounter: Payer: Self-pay | Admitting: Hematology and Oncology

## 2018-03-13 ENCOUNTER — Inpatient Hospital Stay: Payer: Medicare Other | Attending: Obstetrics | Admitting: Hematology and Oncology

## 2018-03-13 ENCOUNTER — Other Ambulatory Visit: Payer: Self-pay | Admitting: Hematology and Oncology

## 2018-03-13 VITALS — BP 157/60 | HR 86 | Temp 98.2°F | Resp 18 | Ht 61.0 in | Wt 286.6 lb

## 2018-03-13 DIAGNOSIS — C541 Malignant neoplasm of endometrium: Secondary | ICD-10-CM | POA: Diagnosis not present

## 2018-03-13 DIAGNOSIS — Z90710 Acquired absence of both cervix and uterus: Secondary | ICD-10-CM | POA: Diagnosis not present

## 2018-03-13 DIAGNOSIS — Z794 Long term (current) use of insulin: Secondary | ICD-10-CM | POA: Diagnosis not present

## 2018-03-13 DIAGNOSIS — E11319 Type 2 diabetes mellitus with unspecified diabetic retinopathy without macular edema: Secondary | ICD-10-CM | POA: Insufficient documentation

## 2018-03-13 DIAGNOSIS — IMO0001 Reserved for inherently not codable concepts without codable children: Secondary | ICD-10-CM

## 2018-03-13 DIAGNOSIS — R300 Dysuria: Secondary | ICD-10-CM

## 2018-03-13 DIAGNOSIS — Z90722 Acquired absence of ovaries, bilateral: Secondary | ICD-10-CM | POA: Insufficient documentation

## 2018-03-13 NOTE — Progress Notes (Signed)
START ON PATHWAY REGIMEN - Uterine     A cycle is every 21 days:     Paclitaxel      Carboplatin   **Always confirm dose/schedule in your pharmacy ordering system**  Patient Characteristics: Endometrioid Histology, Newly Diagnosed, Resected, Stage IIIA - Grade 1, 2, or 3 Histology: Endometrioid Histology Therapeutic Status: Newly Diagnosed AJCC T Category: T3 AJCC N Category: N0 AJCC M Category: M0 AJCC 8 Stage Grouping: III Surgical Status: Resected Intent of Therapy: Curative Intent, Discussed with Patient 

## 2018-03-13 NOTE — Telephone Encounter (Signed)
Gave patient avs report and appointments for December. Patient scheduled for 1st tx 12/10 (no availability 12/9) and for ched/fu 12/9 instead of 12/6. Patient needs AM for ched and for family to support. Per patient request due to availability and need for family to be present ched/fu scheduled 12/9.

## 2018-03-14 ENCOUNTER — Other Ambulatory Visit: Payer: Self-pay

## 2018-03-14 ENCOUNTER — Encounter: Payer: Self-pay | Admitting: Gynecologic Oncology

## 2018-03-14 ENCOUNTER — Inpatient Hospital Stay: Payer: Medicare Other

## 2018-03-14 ENCOUNTER — Inpatient Hospital Stay (HOSPITAL_BASED_OUTPATIENT_CLINIC_OR_DEPARTMENT_OTHER): Payer: Medicare Other | Admitting: Gynecologic Oncology

## 2018-03-14 ENCOUNTER — Telehealth: Payer: Self-pay

## 2018-03-14 VITALS — BP 149/60 | HR 72 | Temp 98.4°F | Resp 18 | Ht 61.0 in | Wt 287.0 lb

## 2018-03-14 DIAGNOSIS — C541 Malignant neoplasm of endometrium: Secondary | ICD-10-CM | POA: Diagnosis not present

## 2018-03-14 DIAGNOSIS — Z90722 Acquired absence of ovaries, bilateral: Secondary | ICD-10-CM

## 2018-03-14 DIAGNOSIS — R300 Dysuria: Secondary | ICD-10-CM | POA: Diagnosis not present

## 2018-03-14 DIAGNOSIS — Z90710 Acquired absence of both cervix and uterus: Secondary | ICD-10-CM

## 2018-03-14 DIAGNOSIS — Z7189 Other specified counseling: Secondary | ICD-10-CM

## 2018-03-14 LAB — URINALYSIS, COMPLETE (UACMP) WITH MICROSCOPIC
BILIRUBIN URINE: NEGATIVE
Glucose, UA: NEGATIVE mg/dL
KETONES UR: NEGATIVE mg/dL
Nitrite: POSITIVE — AB
Protein, ur: NEGATIVE mg/dL
Specific Gravity, Urine: 1.016 (ref 1.005–1.030)
pH: 5 (ref 5.0–8.0)

## 2018-03-14 LAB — CBC WITH DIFFERENTIAL/PLATELET
Abs Immature Granulocytes: 0.08 10*3/uL — ABNORMAL HIGH (ref 0.00–0.07)
Basophils Absolute: 0.1 10*3/uL (ref 0.0–0.1)
Basophils Relative: 1 %
EOS PCT: 13 %
Eosinophils Absolute: 1 10*3/uL — ABNORMAL HIGH (ref 0.0–0.5)
HCT: 34.3 % — ABNORMAL LOW (ref 36.0–46.0)
Hemoglobin: 10.8 g/dL — ABNORMAL LOW (ref 12.0–15.0)
Immature Granulocytes: 1 %
Lymphocytes Relative: 19 %
Lymphs Abs: 1.4 10*3/uL (ref 0.7–4.0)
MCH: 30.1 pg (ref 26.0–34.0)
MCHC: 31.5 g/dL (ref 30.0–36.0)
MCV: 95.5 fL (ref 80.0–100.0)
MONOS PCT: 11 %
Monocytes Absolute: 0.9 10*3/uL (ref 0.1–1.0)
Neutro Abs: 4.2 10*3/uL (ref 1.7–7.7)
Neutrophils Relative %: 55 %
Platelets: 229 10*3/uL (ref 150–400)
RBC: 3.59 MIL/uL — ABNORMAL LOW (ref 3.87–5.11)
RDW: 14.4 % (ref 11.5–15.5)
WBC: 7.6 10*3/uL (ref 4.0–10.5)
nRBC: 0 % (ref 0.0–0.2)

## 2018-03-14 LAB — COMPREHENSIVE METABOLIC PANEL
ALBUMIN: 3.3 g/dL — AB (ref 3.5–5.0)
ALT: 23 U/L (ref 0–44)
AST: 26 U/L (ref 15–41)
Alkaline Phosphatase: 65 U/L (ref 38–126)
Anion gap: 11 (ref 5–15)
BUN: 22 mg/dL (ref 8–23)
CO2: 23 mmol/L (ref 22–32)
Calcium: 9 mg/dL (ref 8.9–10.3)
Chloride: 105 mmol/L (ref 98–111)
Creatinine, Ser: 1.03 mg/dL — ABNORMAL HIGH (ref 0.44–1.00)
GFR calc Af Amer: >60 mL/min (ref >60)
GFR calc non Af Amer: 56 mL/min — ABNORMAL LOW (ref >60)
Glucose, Bld: 171 mg/dL — ABNORMAL HIGH (ref 70–99)
Potassium: 4.4 mmol/L (ref 3.5–5.1)
Sodium: 139 mmol/L (ref 135–145)
Total Bilirubin: 0.3 mg/dL (ref 0.3–1.2)
Total Protein: 7.1 g/dL (ref 6.5–8.1)

## 2018-03-14 MED ORDER — NITROFURANTOIN MONOHYD MACRO 100 MG PO CAPS: 100.0000 mg | ORAL_CAPSULE | Freq: Two times a day (BID) | ORAL | 0 refills | Status: DC

## 2018-03-14 NOTE — Assessment & Plan Note (Signed)
She is currently on insulin therapy She denies baseline peripheral neuropathy I plan to reduce premedication dexamethasone and has advised her to make appointment to see primary care doctor for insulin adjustment as needed We discussed the importance of dietary modification during treatment

## 2018-03-14 NOTE — Assessment & Plan Note (Signed)
I discussed with the patient and her daughter the approach for adjuvant chemotherapy and radiation therapy for resected endometrial cancer. I recommend port placement, chemo education class, baseline blood test, chemotherapy consent and tentatively will schedule chemotherapy to start next week. Due to her morbid obesity, she would likely need dose adjustment Due to her insulin-dependent diabetes, I plan to reduce a dose of premedication dexamethasone and I have advised her to schedule appointment to see her primary care doctor for close monitoring of blood sugar and adjustment of her insulin as needed.

## 2018-03-14 NOTE — Telephone Encounter (Signed)
Called and given below message. She verbalized understanding. Rx sent to her preferred pharmacy. 

## 2018-03-14 NOTE — Progress Notes (Signed)
[place] at [facility]   Follow-up Note: New Patient  Consult was initially requested by Dr. Hale Bogus for Grade 1 Endometrial Adenocarcinoma   Chief Complaint  Patient presents with  . Endometrial cancer, FIGO stage IIIA (HCC)     Assessment  Endometrial Cancer: stage IIIA grade 1 endometrioid (MMR normal, MSI stable) s/p staging surgery on 02/19/18.  [place] has metastatic, high risk endometrial cancer and we recommend adjuvant chemotherapy with carboplatin and paclitaxel for 6 cycles in accordance with NCCN guidelines and the results of GOG258.  I discussed chemotherapy, side effects, prognosis, goals of therapy.  She has healed adequately to commence chemotherapy.  I will see her back after completing adjuvant therapy. I recommend CT imaging at the completion of imaging to evaluate nodes for completeness of response and baseline imaging.    HPI: [name]  is a 67 y.o.  P2, - a retired Marine scientist  She had a h/o complex hyperplasia without atypia and was treated with a Mirena IUD x 5 years. ~2017 this was removed and she was noted to have followup biopsies with no residual hyperplasia. Then in July 2019 she had spotting for a few days. This resolved and then she noted another episode early Oct 2019. She presented to Dr. Sabra Heck and a TVUS was perfomed showing a thickened EM stripe with an area showing vascular flow. Therefore an EMB was performed and that revealed: Endometrium, biopsy - ENDOMETRIOID ADENOCARCINOMA, FIGO GRADE 1. - SEE COMMENT. Microscopic Comment Internal departmental review obtained (Dr. Lyndon Code) with agreement. Results are phoned to [facility] in the office of Dr. Edwinna Areola. (MEG:ah 01/31/18) [name] MD  She was therefore referred for management and recommendations  NOTABLE is her obesity. She states since losing her son, who died at 69 years old as complications from his Tetrology of Fallot, she has  been overweight.   Adhesive allergy reviewed - She has done OK with steristrips after knee surgery PCN allergy reviewed - she has used Keflex without issue  States last HgbA1C was 7.3 12/2017  Imported EPIC Oncologic History:  Oncology History   Endometrioid MSI     Endometrial cancer ([place])   01/24/2018 Initial Diagnosis    Patient was seen by GYN for PMB since July 2019.  Pt has hx of complex endometrial hyperplasia without atypica that was treated with Mirena IUD for 5 years.  This was removed about two years ago.  She did have follow up biopsies after placement and did have resolution of hyperplasia.    Patient's last menstrual period was 08/10/2010.    01/25/2018 Pathology Results    Endometrium, biopsy - ENDOMETRIOID ADENOCARCINOMA, FIGO GRADE 1. - SEE COMMENT.    01/25/2018 Procedure    She had endometrial biopsy in GYN office    01/25/2018 Imaging    US pelvis  UTERUS: 8.5 x 5.3 x 4.5cm EMS: 18-32m, areas of vascular flow noted as well ADNEXA: Left ovary: not seen transvaginally or transabdominally due to habitus                  Right ovary: same as with left ovary CUL DE SAC: no free fluid     02/19/2018 Pathology Results    Uterus +/- tubes/ovaries, neoplastic, cervix - UTERUS: -ENDO/MYOMETRIUM: INVASIVE ENDOMETRIOID ADENOCARCINOMA WITH FOCAL SQUAMOUS DIFFERENTIATION (FIGO GRADE 1), SPANNING 5.8 CM. TUMOR INVADES LESS THAN ONE HALF OF THE MYOMETRIUM. TUMOR INVOLVES CERVICAL STROMA AND BILATERAL FALLOPIAN TUBE LUMEN.  SEE ONCOLOGY TABLE. -SEROSA: UNREMARKABLE. NO MALIGNANCY. - CERVIX: STROMAL INVOLVEMENT BY ENDOMETRIOID ADENOCARCINOMA. - BILATERAL OVARIES: INCLUSION CYSTS. NO MALIGNANCY. - BILATERAL FALLOPIAN TUBES: LUMEN INVOLVEMENT BY ENDOMETRIOID ADENOCARCINOMA. Microscopic Comment UTERUS, CARCINOMA OR CARCINOSARCOMA Procedure: Total hysterectomy with bilateral salpingo-oophorectomy. Histologic type: Endometrioid adenocarcinoma with squamous  differentiation. Histologic Grade: FIGO grade 1. Myometrial invasion: Depth of invasion: 3 mm Myometrial thickness: 17 mm Uterine Serosa Involvement: Not identified. Cervical stromal involvement: Present. Extent of involvement of other organs: Involves cervical stroma and lumen of bilateral fallopian tubes. Lymphovascular invasion: Not identified. Regional Lymph Nodes: None examined. Tumor block for ancillary studies: 1D-G MMR / MSI testing: Will be ordered.    02/19/2018 Surgery    Surgeon: Donaciano Eva   Operation: Robotic-assisted laparoscopic total hysterectomy with bilateral salpingoophorectomy (22 modifier for extreme obesity - see details in body of operative note)   Operative Findings:  : extreme intraperitoneal adiposity which prevented retroperitoneal visualization of the lymph node basins and increased the duration of the procedure by 1 hour (for additional instrumentation, placement of retraction sutures, additional personnel required for assistance.  8cm uterus, grossly normal ovaries, surgically interrupted tubes    03/05/2018 Imaging    1. Upper normal pelvic sidewall lymph nodes bilaterally. Close attention on follow-up recommended. 2. No other findings to suggest metastatic disease in the chest, abdomen, or pelvis. 3.  Aortic Atherosclerois (ICD10-170.0)     03/12/2018 Cancer Staging    Staging form: Corpus Uteri - Carcinoma and Carcinosarcoma, AJCC 8th Edition - Pathologic: Stage III (pT3, pN0, cM0) - Signed by Heath Lark, MD on 03/12/2018     Measurement of disease: . TBD  Radiology: . 01/25/18 - TVUS  OSH - UTERUS: 8.5 x 5.3 x 4.5cm EMS: 18-66m, areas of vascular flow noted as well ADNEXA: Left ovary: not seen transvaginally or transabdominally due to habitus Right ovary: same as with left ovary CUL DE SAC: no free fluid . CT chest/abd/pelvis on 03/05/18 (postop) showed upper limit size pelvic nodes, but no other signs of metastatic disease.      Outpatient Encounter Medications as of 03/14/2018  Medication Sig  . albuterol (PROVENTIL HFA;VENTOLIN HFA) 108 (90 Base) MCG/ACT inhaler Inhale 2 puffs into lungs every 6 hours as needed for wheezing or shortness of breath Dx: J45.50  . albuterol (PROVENTIL) (2.5 MG/3ML) 0.083% nebulizer solution Take 3 mLs (2.5 mg total) by nebulization every 4 (four) hours as needed for wheezing or shortness of breath (Dx: J45.50).  . clobetasol ointment (TEMOVATE) 0.05 % APPLY TO AFFECTED AREA TWICE A DAY (Patient taking differently: Apply 1 application topically 2 (two) times daily as needed (for irritation). )  . enalapril (VASOTEC) 10 MG tablet TAKE 1 TABLET BY MOUTH EVERY DAY (Patient taking differently: Take 10 mg by mouth daily. )  . fluticasone (CUTIVATE) 0.05 % cream Apply to affected area of R lower leg twice per day (Patient taking differently: Apply 1 application topically See admin instructions. Apply to affected area of R lower leg twice per day as needed for irritation)  . fluticasone (FLONASE SENSIMIST) 27.5 MCG/SPRAY nasal spray Place 1 spray into the nose daily as needed for rhinitis.  . furosemide (LASIX) 20 MG tablet TAKE 1 TABLET BY MOUTH EVERY DAY (Patient taking differently: Take 20 mg by mouth daily. )  . HYDROcodone-acetaminophen (NORCO/VICODIN) 5-325 MG tablet 1-2 tabs po qd prn pain  . insulin aspart (NOVOLOG) 100 UNIT/ML injection [facility] (AVG USE IS 17 UNITS THREE TIMES DAILY)  .  insulin NPH Human (NOVOLIN N) 100 UNIT/ML injection 65 U SQ qAM, 65 U SQ mid day, and 61 U SQ q evening (Patient taking differently: Inject 60 Units into the skin 3 (three) times daily. )  . Insulin Syringes, Disposable, U-100 0.3 ML MISC Use to inject insulin--6 injections per day  . levocetirizine (XYZAL) 5 MG tablet Take 5 mg by mouth at bedtime as needed for allergies.   Marland Kitchen levothyroxine (SYNTHROID, LEVOTHROID) 125 MCG tablet TAKE 1 TABLET BY MOUTH EVERY MORNING ON AN EMPTY  STOMACH (Patient taking differently: Take 125 mcg by mouth daily before breakfast. )  . meloxicam (MOBIC) 15 MG tablet TAKE 1 TABLET BY MOUTH EVERY DAY AS NEEDED (Patient taking differently: Take 15 mg by mouth daily as needed for pain. )  . metFORMIN (GLUCOPHAGE) 500 MG tablet TAKE 1 TABLET BY MOUTH EVERY MORNING AND 2 TABLETS BY MOUTH AT BEDTIME (Patient taking differently: Take 500-1,000 mg by mouth See admin instructions. Take 500 mg by mouth in the morning and take 1000 mg by mouth at supper)  . miconazole (MICRO GUARD) 2 % powder Apply 1 application topically 2 (two) times daily as needed for itching.  . montelukast (SINGULAIR) 10 MG tablet Take 10 mg by mouth every evening.   . pravastatin (PRAVACHOL) 20 MG tablet TAKE 1 TABLET BY MOUTH EVERY DAY (Patient taking differently: Take 20 mg by mouth daily. )  . Probiotic Product (PROBIOTIC PO) Take 1 capsule by mouth daily.  Marland Kitchen senna-docusate (SENOKOT-S) 8.6-50 MG tablet Take 2 tablets by mouth at bedtime. Hold if having loose stools  . SYMBICORT 160-4.5 MCG/ACT inhaler Inhale 2 puffs into the lungs 2 (two) times daily.   No facility-administered encounter medications on file as of 03/14/2018.    Allergies  Allergen Reactions  . Adhesive [Tape] Other (See Comments)    Takes patient's skin off.  . Banana Diarrhea and Nausea Only  . Eggs Or Egg-Derived Products Diarrhea and Nausea Only  . Latex Other (See Comments)    sensativity  . Penicillins Rash and Other (See Comments)    Has had cephalosporins without incident  . Pine Swelling and Rash  . Rose Swelling and Rash    Past Medical History:  Diagnosis Date  . Allergic rhinitis    Allergy testing: results pending as of 05/28/16 (Dr. Harold Hedge)  . Anemia   . Arthritis   . Complex endometrial hyperplasia with atypia 12/12   Most recent endometrial bx was 10/2012-NEG  . Cough variant asthma   . DDD (degenerative disc disease), lumbar   . Diabetes mellitus with complication ([place])    Mild  nonprolif DR R eye 12/2017-->referred to retinal specialist  . Encounter for insertion of mirena IUD 5/12  . Endometrial cancer ([place]) 02/2018   Stage I.  Path: endometroid adenocarcinoma involving cervix, uterus, and fallopian tubes (Pt got TAH & BSO 02/2018).  . Family history of adverse reaction to anesthesia    brother had severe hypotension  . Fracture, foot 8/14   Hx of left foot stress fracture  . GERD (gastroesophageal reflux disease)   . Hemorrhoids   . Herpes zoster 10/2015   L side belt-line  . History of adenomatous polyp of colon 02/25/13   5 mm cecal polyp removed by Dr. Trevor Mace 5 yrs  . History of blood transfusion   . History of cellulitis 08/2010   Left (Since replacemnt of Left knee  . History of recent dental procedure 02/2018  . Hyperlipidemia  Not on statin b/c lipids "stayed down when sugars came down" per pt.  She says Dr. Chalmers Cater knows she is not on statin anymore.  . Hypertension   . Hypothyroidism   . Nephrolithiasis 4/07  . Obesity   . OSA on CPAP   . Osteoarthritis    knees, ankle, + ? scapholunate ligament disruption (x-ray 06/2017)--ortho referral.  . Sciatica of left side   . Severe persistent asthma    cough-variant--saw Allergist 05/25/16 and was switched from max dose advair to symbicort.  . Systolic murmur    ECHO fine 10/2016-->murmur likely flow murmur assoc with HTN.  Marland Kitchen UTI (lower urinary tract infection)   . Zoon's vulvitis    Bx-proven (GYN) -lichen sclerosis.  Clobetasol 0.05% ointment per GYN   Past Surgical History:  Procedure Laterality Date  . ANKLE FRACTURE SURGERY  1984   Pin & repair  . ARTHROSCOPIC REPAIR ACL  2000   Due to ACL tear  . ARTHROSCOPIC REPAIR ACL  5/08  . Blateral knee rerlacements x4 2 times each knee    . BREAST BIOPSY Right 2014   Benign  . CARDIAC CATHETERIZATION  5/07   clear vessel mild mitral   . CARPAL TUNNEL RELEASE Right [id number]   1989 Left  . CESAREAN SECTION  1985  . CHOLECYSTECTOMY OPEN  1988   . COLONOSCOPY N/A 02/25/2013   Tubular adenoma x 1: Recall 5 yrs. Procedure: COLONOSCOPY;  Surgeon: Juanita Craver, MD;  Location: [facility];  Service: Endoscopy;  Laterality: N/A;  . COMBINED HYSTEROSCOPY DIAGNOSTIC / D&C  2/12   Bx neg  . DEXA  08/2007   Bone density normal.  . DILATION AND CURETTAGE OF UTERUS  12/11  . LYMPH NODE BIOPSY N/A 02/19/2018   Procedure: Sentinel LYMPH NODE BIOPSY;  Surgeon: Everitt Amber, MD;  Location: [facility];  Service: Gynecology;  Laterality: N/A;  . REPLACEMENT TOTAL KNEE Left 5/12   X 2 on each  . ROBOTIC ASSISTED TOTAL HYSTERECTOMY WITH BILATERAL SALPINGO OOPHERECTOMY N/A 02/19/2018   For endometrial cancer.  Procedure: XI ROBOTIC ASSISTED TOTAL HYSTERECTOMY WITH BILATERAL SALPINGO OOPHORECTOMY;  Surgeon: Everitt Amber, MD;  Location: [place];  Service: Gynecology;  Laterality: N/A;  . TRANSTHORACIC ECHOCARDIOGRAM  10/11/2016    EF 55-60%, grd I DD.  . TUBAL LIGATION  1985   C-Section        Past Gynecological History:   GYNECOLOGIC HISTORY:  . Patient's last menstrual period was 08/10/2010.  . Menarche: 66 years old . P 2 . Contraceptive h/o Mirena IUD . HRT None  . Last Pap 10/2016 negative Family Hx:  Family History  Problem Relation Age of Onset  . Diabetes Father   . COPD Father   . Stroke Father   . Celiac disease Brother   . Rheum arthritis Brother   . Thyroid disease Brother   . Diabetes Brother   . Alzheimer's disease Mother   . Arthritis Mother   . Other Son        Died age 14 -Tetrology of Fallot - VSD/pulmonary atresia  . Breast cancer Other        postmenopausal when diagnosed   Social Hx:  Marland Kitchen Tobacco use: none . Alcohol use: none . Illicit Drug use: none . Illicit IV Drug use: none    Review of Systems: Review of Systems  All other systems reviewed and are negative.   Vitals:  Vitals:   03/14/18 0915  BP: (!) 149/60  Pulse: 72  Resp: 18  Temp: 98.4 F (36.9 C)  SpO2:  98%   Vitals:   03/14/18 0915  Weight: 287 lb (130.2 kg)  Height: '5\' 1"'  (1.549 m)   Body mass index is 54.23 kg/m.  Physical Exam: General :  Obese. Well developed, 67 y.o., female in no apparent distress HEENT:  Normocephalic/atraumatic, symmetric, EOMI, eyelids normal Neck:   Supple, no masses.  Lymphatics:  No cervical/ submandibular/ supraclavicular/ infraclavicular/ inguinal adenopathy Respiratory:  Respirations unlabored, no use of accessory muscles CV:   Deferred Breast:  Deferred Musculoskeletal: No CVA tenderness, normal muscle strength. Abdomen:  Obese with pannus. Soft, non-tender and nondistended. No evidence of hernia. No masses. Extremities:  No lymphedema, no erythema, non-tender. Skin:   Normal inspection; candida at mons/groin Neuro/Psych:  No focal motor deficit, no abnormal mental status. Normal gait. Normal affect. Alert and oriented to person, place, and time  Genito Urinary: vaginal cuff in tact and healing normally, no lesions or masses or blood.  20 minutes of direct face to face counseling time was spent with the patient. This included discussion about prognosis, therapy recommendations and postoperative side effects and are beyond the scope of routine postoperative care.  Cc: M. Edwinna Areola, MD (Referring Ob/Gyn) Anitra Lauth Adrian Blackwater, MD (PCP)

## 2018-03-14 NOTE — Patient Instructions (Signed)
Please follow-up with Dr Alvy Bimler as scheduled for chemotherapy.  After completing chemotherapy and radiation, you will follow-up with Dr Denman George for ongoing checkups.

## 2018-03-14 NOTE — Assessment & Plan Note (Signed)
She has symptoms of dysuria.  Will order urinalysis and urine culture to follow.

## 2018-03-14 NOTE — Progress Notes (Signed)
[place] CONSULT NOTE  Patient Care Team: Tammi Sou, MD as PCP - General (Family Medicine) Harold Hedge, Darrick Grinder, MD as Consulting Physician (Allergy and Immunology) Juanita Craver, MD as Consulting Physician (Gastroenterology) Martinique, Peter M, MD as Consulting Physician (Cardiology) Megan Salon, MD as Consulting Physician (Gynecology) Isabel Caprice, MD as Consulting Physician (Gynecologic Oncology)  ASSESSMENT & PLAN:  Endometrial cancer [facility]) I discussed with the patient and her daughter the approach for adjuvant chemotherapy and radiation therapy for resected endometrial cancer. I recommend port placement, chemo education class, baseline blood test, chemotherapy consent and tentatively will schedule chemotherapy to start next week. Due to her morbid obesity, she would likely need dose adjustment Due to her insulin-dependent diabetes, I plan to reduce a dose of premedication dexamethasone and I have advised her to schedule appointment to see her primary care doctor for close monitoring of blood sugar and adjustment of her insulin as needed.  Controlled insulin dependent diabetes mellitus with retinopathy ([place]) She is currently on insulin therapy She denies baseline peripheral neuropathy I plan to reduce premedication dexamethasone and has advised her to make appointment to see primary care doctor for insulin adjustment as needed We discussed the importance of dietary modification during treatment  Dysuria She has symptoms of dysuria.  Will order urinalysis and urine culture to follow.   Orders Placed This Encounter  Procedures  . Urine Culture    Standing Status:   Future    Standing Expiration Date:   04/17/2019  . IR IMAGING GUIDED PORT INSERTION    Standing Status:   Future    Standing Expiration Date:   05/15/2019    Order Specific Question:   Reason for Exam (SYMPTOM  OR DIAGNOSIS REQUIRED)    Answer:   need port for chemo    Order Specific  Question:   Preferred Imaging Location?    Answer:   [facility]  . CBC with Differential/Platelet    Standing Status:   Standing    Number of Occurrences:   22    Standing Expiration Date:   03/14/2019  . Comprehensive metabolic panel    Standing Status:   Standing    Number of Occurrences:   9    Standing Expiration Date:   03/14/2019  . Urinalysis, Complete w Microscopic    Standing Status:   Future    Standing Expiration Date:   03/14/2019     CHIEF COMPLAINTS/PURPOSE OF CONSULTATION:  Endometrial cancer, for adjuvant treatment  HISTORY OF PRESENTING ILLNESS:  [name] 67 y.o. female is here because of recent diagnosis of endometrial cancer.  Her daughter, [name] is present The patient is a retired Marine scientist.  I have reviewed her records extensively.  Her symptoms began with postmenopausal bleeding since July of this year. She underwent further evaluation and eventual surgical resection and presents today for discussion about adjuvant treatment I have reviewed her chart and materials related to her cancer extensively and collaborated history with the patient. Summary of oncologic history is as follows: Oncology History   Endometrioid MSI     Endometrial cancer ([place])   01/24/2018 Initial Diagnosis    Patient was seen by GYN for PMB since July 2019.  Pt has hx of complex endometrial hyperplasia without atypica that was treated with Mirena IUD for 5 years.  This was removed about two years ago.  She did have follow up biopsies after placement and did have resolution of hyperplasia.  Patient's last menstrual period was 08/10/2010.    01/25/2018 Pathology Results    Endometrium, biopsy - ENDOMETRIOID ADENOCARCINOMA, FIGO GRADE 1. - SEE COMMENT.    01/25/2018 Procedure    She had endometrial biopsy in GYN office    01/25/2018 Imaging    US pelvis  UTERUS: 8.5 x 5.3 x 4.5cm EMS: 18-32m, areas of vascular flow noted as well ADNEXA: Left ovary: not seen  transvaginally or transabdominally due to habitus                  Right ovary: same as with left ovary CUL DE SAC: no free fluid     02/19/2018 Pathology Results    Uterus +/- tubes/ovaries, neoplastic, cervix - UTERUS: -ENDO/MYOMETRIUM: INVASIVE ENDOMETRIOID ADENOCARCINOMA WITH FOCAL SQUAMOUS DIFFERENTIATION (FIGO GRADE 1), SPANNING 5.8 CM. TUMOR INVADES LESS THAN ONE HALF OF THE MYOMETRIUM. TUMOR INVOLVES CERVICAL STROMA AND BILATERAL FALLOPIAN TUBE LUMEN. SEE ONCOLOGY TABLE. -SEROSA: UNREMARKABLE. NO MALIGNANCY. - CERVIX: STROMAL INVOLVEMENT BY ENDOMETRIOID ADENOCARCINOMA. - BILATERAL OVARIES: INCLUSION CYSTS. NO MALIGNANCY. - BILATERAL FALLOPIAN TUBES: LUMEN INVOLVEMENT BY ENDOMETRIOID ADENOCARCINOMA. Microscopic Comment UTERUS, CARCINOMA OR CARCINOSARCOMA Procedure: Total hysterectomy with bilateral salpingo-oophorectomy. Histologic type: Endometrioid adenocarcinoma with squamous differentiation. Histologic Grade: FIGO grade 1. Myometrial invasion: Depth of invasion: 3 mm Myometrial thickness: 17 mm Uterine Serosa Involvement: Not identified. Cervical stromal involvement: Present. Extent of involvement of other organs: Involves cervical stroma and lumen of bilateral fallopian tubes. Lymphovascular invasion: Not identified. Regional Lymph Nodes: None examined. Tumor block for ancillary studies: 1D-G MMR / MSI testing: Will be ordered.    02/19/2018 Surgery    Surgeon: RDonaciano Eva  Operation: Robotic-assisted laparoscopic total hysterectomy with bilateral salpingoophorectomy (22 modifier for extreme obesity - see details in body of operative note)   Operative Findings:  : extreme intraperitoneal adiposity which prevented retroperitoneal visualization of the lymph node basins and increased the duration of the procedure by 1 hour (for additional instrumentation, placement of retraction sutures, additional personnel required for assistance.  8cm uterus, grossly  normal ovaries, surgically interrupted tubes    03/05/2018 Imaging    1. Upper normal pelvic sidewall lymph nodes bilaterally. Close attention on follow-up recommended. 2. No other findings to suggest metastatic disease in the chest, abdomen, or pelvis. 3.  Aortic Atherosclerois (ICD10-170.0)     03/12/2018 Cancer Staging    Staging form: Corpus Uteri - Carcinoma and Carcinosarcoma, AJCC 8th Edition - Pathologic: Stage III (pT3, pN0, cM0) - Signed by GHeath Lark MD on 03/12/2018    She is recovering well after surgery.  Denies significant pain or bleeding.  She complained of mild urinary discomfort The patient has significant insulin-dependent diabetes and has been on insulin for over 10 years. She has baseline retinopathy but denies peripheral neuropathy. She uses a cane to walk for balance.  She denies recent falls.  MEDICAL HISTORY:  Past Medical History:  Diagnosis Date  . Allergic rhinitis    Allergy testing: results pending as of 05/28/16 (Dr. VHarold Hedge  . Anemia   . Arthritis   . Complex endometrial hyperplasia with atypia 12/12   Most recent endometrial bx was 10/2012-NEG  . Cough variant asthma   . DDD (degenerative disc disease), lumbar   . Diabetes mellitus with complication ([place]    Mild nonprolif DR R eye 12/2017-->referred to retinal specialist  . Encounter for insertion of mirena IUD 5/12  . [place]ometrial cancer ([place] 02/2018   Stage I.  Path: endometroid adenocarcinoma involving cervix, uterus, and fallopian  tubes (Pt got TAH & BSO 02/2018).  . Family history of adverse reaction to anesthesia    brother had severe hypotension  . Fracture, foot 8/14   Hx of left foot stress fracture  . GERD (gastroesophageal reflux disease)   . Hemorrhoids   . Herpes zoster 10/2015   L side belt-line  . History of adenomatous polyp of colon 02/25/13   5 mm cecal polyp removed by Dr. Trevor Mace 5 yrs  . History of blood transfusion   . History of cellulitis 08/2010   Left  (Since replacemnt of Left knee  . History of recent dental procedure 02/2018  . Hyperlipidemia    Not on statin b/c lipids "stayed down when sugars came down" per pt.  She says Dr. Chalmers Cater knows she is not on statin anymore.  . Hypertension   . Hypothyroidism   . Nephrolithiasis 4/07  . Obesity   . OSA on CPAP   . Osteoarthritis    knees, ankle, + ? scapholunate ligament disruption (x-ray 06/2017)--ortho referral.  . Sciatica of left side   . Severe persistent asthma    cough-variant--saw Allergist 05/25/16 and was switched from max dose advair to symbicort.  . Systolic murmur    ECHO fine 10/2016-->murmur likely flow murmur assoc with HTN.  Marland Kitchen UTI (lower urinary tract infection)   . Zoon's vulvitis    Bx-proven (GYN) -lichen sclerosis.  Clobetasol 0.05% ointment per GYN    SURGICAL HISTORY: Past Surgical History:  Procedure Laterality Date  . ANKLE FRACTURE SURGERY  1984   Pin & repair  . ARTHROSCOPIC REPAIR ACL  2000   Due to ACL tear  . ARTHROSCOPIC REPAIR ACL  5/08  . Blateral knee rerlacements x4 2 times each knee    . BREAST BIOPSY Right 2014   Benign  . CARDIAC CATHETERIZATION  5/07   clear vessel mild mitral   . CARPAL TUNNEL RELEASE Right [id number]   1989 Left  . CESAREAN SECTION  1985  . CHOLECYSTECTOMY OPEN  1988  . COLONOSCOPY N/A 02/25/2013   Tubular adenoma x 1: Recall 5 yrs. Procedure: COLONOSCOPY;  Surgeon: Juanita Craver, MD;  Location: [facility];  Service: Endoscopy;  Laterality: N/A;  . COMBINED HYSTEROSCOPY DIAGNOSTIC / D&C  2/12   Bx neg  . DEXA  08/2007   Bone density normal.  . DILATION AND CURETTAGE OF UTERUS  12/11  . LYMPH NODE BIOPSY N/A 02/19/2018   Procedure: Sentinel LYMPH NODE BIOPSY;  Surgeon: Everitt Amber, MD;  Location: [facility];  Service: Gynecology;  Laterality: N/A;  . REPLACEMENT TOTAL KNEE Left 5/12   X 2 on each  . ROBOTIC ASSISTED TOTAL HYSTERECTOMY WITH BILATERAL SALPINGO OOPHERECTOMY N/A 02/19/2018   For  endometrial cancer.  Procedure: XI ROBOTIC ASSISTED TOTAL HYSTERECTOMY WITH BILATERAL SALPINGO OOPHORECTOMY;  Surgeon: Everitt Amber, MD;  Location: [facility];  Service: Gynecology;  Laterality: N/A;  . TRANSTHORACIC ECHOCARDIOGRAM  10/11/2016    EF 55-60%, grd I DD.  . TUBAL LIGATION  1985   C-Section    SOCIAL HISTORY: Social History   Socioeconomic History  . Marital status: Married    Spouse name: Louie Casa  . Number of children: 2  . Years of education: Not on file  . Highest education level: Not on file  Occupational History  . Occupation: retired Animal nutritionist  . Financial resource strain: Not on file  . Food insecurity:    Worry: Not on file    Inability:  Not on file  . Transportation needs:    Medical: Not on file    Non-medical: Not on file  Tobacco Use  . Smoking status: Never Smoker  . Smokeless tobacco: Never Used  Substance and Sexual Activity  . Alcohol use: No    Alcohol/week: 0.0 standard drinks  . Drug use: No  . Sexual activity: Never    Partners: Male    Birth control/protection: IUD  Lifestyle  . Physical activity:    Days per week: Not on file    Minutes per session: Not on file  . Stress: Not on file  Relationships  . Social connections:    Talks on phone: Not on file    Gets together: Not on file    Attends religious service: Not on file    Active member of club or organization: Not on file    Attends meetings of clubs or organizations: Not on file    Relationship status: Not on file  . Intimate partner violence:    Fear of current or ex partner: Not on file    Emotionally abused: Not on file    Physically abused: Not on file    Forced sexual activity: Not on file  Other Topics Concern  . Not on file  Social History Narrative  . Not on file    FAMILY HISTORY: Family History  Problem Relation Age of Onset  . Diabetes Father   . COPD Father   . Stroke Father   . Celiac disease Brother   . Rheum arthritis Brother    . Thyroid disease Brother   . Diabetes Brother   . Alzheimer's disease Mother   . Arthritis Mother   . Other Son        Died age 35 -Tetrology of Fallot - VSD/pulmonary atresia  . Breast cancer Other        postmenopausal when diagnosed    ALLERGIES:  is allergic to adhesive [tape]; banana; eggs or egg-derived products; latex; penicillins; pine; and rose.  MEDICATIONS:  Current Outpatient Medications  Medication Sig Dispense Refill  . albuterol (PROVENTIL HFA;VENTOLIN HFA) 108 (90 Base) MCG/ACT inhaler Inhale 2 puffs into lungs every 6 hours as needed for wheezing or shortness of breath Dx: J45.50 18 Inhaler 3  . albuterol (PROVENTIL) (2.5 MG/3ML) 0.083% nebulizer solution Take 3 mLs (2.5 mg total) by nebulization every 4 (four) hours as needed for wheezing or shortness of breath (Dx: J45.50). 75 mL 1  . clobetasol ointment (TEMOVATE) 0.05 % APPLY TO AFFECTED AREA TWICE A DAY (Patient taking differently: Apply 1 application topically 2 (two) times daily as needed (for irritation). ) 60 g 1  . enalapril (VASOTEC) 10 MG tablet TAKE 1 TABLET BY MOUTH EVERY DAY (Patient taking differently: Take 10 mg by mouth daily. ) 90 tablet 1  . fluticasone (CUTIVATE) 0.05 % cream Apply to affected area of R lower leg twice per day (Patient taking differently: Apply 1 application topically See admin instructions. Apply to affected area of R lower leg twice per day as needed for irritation) 30 g 2  . fluticasone (FLONASE SENSIMIST) 27.5 MCG/SPRAY nasal spray Place 1 spray into the nose daily as needed for rhinitis.    . furosemide (LASIX) 20 MG tablet TAKE 1 TABLET BY MOUTH EVERY DAY (Patient taking differently: Take 20 mg by mouth daily. ) 90 tablet 1  . HYDROcodone-acetaminophen (NORCO/VICODIN) 5-325 MG tablet 1-2 tabs po qd prn pain 60 tablet 0  . insulin aspart (NOVOLOG) 100  UNIT/ML injection USE PER SLIDING SCALE AT [facility] MEAL (AVG USE IS 17 UNITS THREE TIMES DAILY) 50 mL 2  . insulin NPH Human (NOVOLIN  N) 100 UNIT/ML injection 65 U SQ qAM, 65 U SQ mid day, and 61 U SQ q evening (Patient taking differently: Inject 60 Units into the skin 3 (three) times daily. ) 50 mL 5  . Insulin Syringes, Disposable, U-100 0.3 ML MISC Use to inject insulin--6 injections per day 540 each 3  . levocetirizine (XYZAL) 5 MG tablet Take 5 mg by mouth at bedtime as needed for allergies.   5  . levothyroxine (SYNTHROID, LEVOTHROID) 125 MCG tablet TAKE 1 TABLET BY MOUTH EVERY MORNING ON AN EMPTY STOMACH (Patient taking differently: Take 125 mcg by mouth daily before breakfast. ) 90 tablet 1  . meloxicam (MOBIC) 15 MG tablet TAKE 1 TABLET BY MOUTH EVERY DAY AS NEEDED (Patient taking differently: Take 15 mg by mouth daily as needed for pain. ) 30 tablet 3  . metFORMIN (GLUCOPHAGE) 500 MG tablet TAKE 1 TABLET BY MOUTH EVERY MORNING AND 2 TABLETS BY MOUTH AT BEDTIME (Patient taking differently: Take 500-1,000 mg by mouth See admin instructions. Take 500 mg by mouth in the morning and take 1000 mg by mouth at supper) 270 tablet 1  . miconazole (MICRO GUARD) 2 % powder Apply 1 application topically 2 (two) times daily as needed for itching.    . montelukast (SINGULAIR) 10 MG tablet Take 10 mg by mouth every evening.     . pravastatin (PRAVACHOL) 20 MG tablet TAKE 1 TABLET BY MOUTH EVERY DAY (Patient taking differently: Take 20 mg by mouth daily. ) 90 tablet 2  . Probiotic Product (PROBIOTIC PO) Take 1 capsule by mouth daily.    Marland Kitchen senna-docusate (SENOKOT-S) 8.6-50 MG tablet Take 2 tablets by mouth at bedtime. Hold if having loose stools 30 tablet 1  . SYMBICORT 160-4.5 MCG/ACT inhaler Inhale 2 puffs into the lungs 2 (two) times daily.     No current facility-administered medications for this visit.     REVIEW OF SYSTEMS:   Constitutional: Denies fevers, chills or abnormal night sweats Eyes: Denies blurriness of vision, double vision or watery eyes Ears, nose, mouth, throat, and face: Denies mucositis or sore  throat Respiratory: Denies cough, dyspnea or wheezes Cardiovascular: Denies palpitation, chest discomfort or lower extremity swelling Gastrointestinal:  Denies nausea, heartburn or change in bowel habits Skin: Denies abnormal skin rashes Lymphatics: Denies new lymphadenopathy or easy bruising Neurological:Denies numbness, tingling or new weaknesses Behavioral/Psych: Mood is stable, no new changes  All other systems were reviewed with the patient and are negative.  PHYSICAL EXAMINATION: ECOG PERFORMANCE STATUS: 1 - Symptomatic but completely ambulatory  Vitals:   03/13/18 1402  BP: (!) 157/60  Pulse: 86  Resp: 18  Temp: 98.2 F (36.8 C)  SpO2: 97%   Filed Weights   03/13/18 1402  Weight: 286 lb 9.6 oz (130 kg)    GENERAL:alert, no distress and comfortable.  She is morbidly obese.  She has difficulties getting to the exam table due to her morbid obesity SKIN: skin color, texture, turgor are normal, no rashes or significant lesions EYES: normal, conjunctiva are pink and non-injected, sclera clear OROPHARYNX:no exudate, no erythema and lips, buccal mucosa, and tongue normal  NECK: supple, thyroid normal size, non-tender, without nodularity LYMPH:  no palpable lymphadenopathy in the cervical, axillary or inguinal LUNGS: clear to auscultation and percussion with normal breathing effort HEART: regular rate & rhythm and no  murmurs and no lower extremity edema ABDOMEN:abdomen soft, non-tender and normal bowel sounds.  Well-healed surgical scar Musculoskeletal:no cyanosis of digits and no clubbing  PSYCH: alert & oriented x 3 with fluent speech NEURO: no focal motor/sensory deficits  LABORATORY DATA:  I have reviewed the data as listed Lab Results  Component Value Date   WBC 9.9 02/20/2018   HGB 9.7 (L) 02/20/2018   HCT 31.8 (L) 02/20/2018   MCV 98.5 02/20/2018   PLT 203 02/20/2018   Recent Labs    10/04/17 1415 02/15/18 1514 02/20/18 0325  NA 141 140 138  K 4.7 4.7 5.0   CL 105 103 104  CO2 '28 28 24  ' GLUCOSE 106* 100* 255*  BUN 29* 23 23  CREATININE 0.89 1.04* 1.02*  CALCIUM 9.8 9.0 8.6*  GFRNONAA  --  54* 56*  GFRAA  --  >60 >60  PROT  --  7.3  --   ALBUMIN  --  3.8  --   AST  --  39  --   ALT  --  30  --   ALKPHOS  --  49  --   BILITOT  --  0.5  --     RADIOGRAPHIC STUDIES: I have personally reviewed the radiological images as listed and agreed with the findings in the report. Ct Chest W Contrast  Result Date: 03/05/2018 CLINICAL DATA:  Endometrial cancer. Status post hysterectomy 02/19/2018 EXAM: CT CHEST, ABDOMEN, AND PELVIS WITH CONTRAST TECHNIQUE: Multidetector CT imaging of the chest, abdomen and pelvis was performed following the standard protocol during bolus administration of intravenous contrast. CONTRAST:  177m OMNIPAQUE IOHEXOL 300 MG/ML  SOLN COMPARISON:  Chest CT 10/04/2016. FINDINGS: CT CHEST FINDINGS Cardiovascular: Heart size upper normal to mildly increased. No substantial pericardial effusion. Atherosclerotic calcification is noted in the wall of the thoracic aorta. Mediastinum/Nodes: No mediastinal lymphadenopathy. There is no hilar lymphadenopathy. The esophagus has normal imaging features. There is no axillary lymphadenopathy. Lungs/Pleura: The central tracheobronchial airways are patent. No suspicious pulmonary nodule or mass. No pleural effusion. Musculoskeletal: No worrisome lytic or sclerotic osseous abnormality. CT ABDOMEN PELVIS FINDINGS Hepatobiliary: No focal abnormality within the liver parenchyma. Gallbladder surgically absent. No intrahepatic or extrahepatic biliary dilation. Pancreas: Diffuse atrophy of pancreatic parenchyma without main ductal dilatation. Spleen: No splenomegaly. No focal mass lesion. Adrenals/Urinary Tract: No adrenal nodule or mass. Kidneys unremarkable. No evidence for hydroureter. The urinary bladder appears normal for the degree of distention. Stomach/Bowel: Stomach is nondistended. No gastric wall  thickening. No evidence of outlet obstruction. Duodenum is normally positioned as is the ligament of Treitz. No small bowel wall thickening. No small bowel dilatation. The terminal ileum is normal. No gross colonic mass. No colonic wall thickening. Vascular/Lymphatic: There is abdominal aortic atherosclerosis without aneurysm. There is no gastrohepatic or hepatoduodenal ligament lymphadenopathy. No intraperitoneal or retroperitoneal lymphadenopathy. Small para-aortic lymph nodes are evident. Borderline lymph nodes are seen in the left external iliac chain measuring 9 mm short axis (98/2) and 8 mm short axis (99/2). Similar small lymph nodes are seen on the right side. Reproductive: The uterus surgically absent. There is no adnexal mass. Other: No intraperitoneal free fluid. Musculoskeletal: Marked laxity of the anterior abdominal wall fascia evident. Degenerative changes noted lumbar spine. IMPRESSION: 1. Upper normal pelvic sidewall lymph nodes bilaterally. Close attention on follow-up recommended. 2. No other findings to suggest metastatic disease in the chest, abdomen, or pelvis. 3.  Aortic Atherosclerois (ICD10-170.0) Electronically Signed   By: EVerda CuminsD.  On: 03/05/2018 14:24   Ct Abdomen Pelvis W Contrast  Result Date: 03/05/2018 CLINICAL DATA:  Endometrial cancer. Status post hysterectomy 02/19/2018 EXAM: CT CHEST, ABDOMEN, AND PELVIS WITH CONTRAST TECHNIQUE: Multidetector CT imaging of the chest, abdomen and pelvis was performed following the standard protocol during bolus administration of intravenous contrast. CONTRAST:  19m OMNIPAQUE IOHEXOL 300 MG/ML  SOLN COMPARISON:  Chest CT 10/04/2016. FINDINGS: CT CHEST FINDINGS Cardiovascular: Heart size upper normal to mildly increased. No substantial pericardial effusion. Atherosclerotic calcification is noted in the wall of the thoracic aorta. Mediastinum/Nodes: No mediastinal lymphadenopathy. There is no hilar lymphadenopathy. The esophagus has  normal imaging features. There is no axillary lymphadenopathy. Lungs/Pleura: The central tracheobronchial airways are patent. No suspicious pulmonary nodule or mass. No pleural effusion. Musculoskeletal: No worrisome lytic or sclerotic osseous abnormality. CT ABDOMEN PELVIS FINDINGS Hepatobiliary: No focal abnormality within the liver parenchyma. Gallbladder surgically absent. No intrahepatic or extrahepatic biliary dilation. Pancreas: Diffuse atrophy of pancreatic parenchyma without main ductal dilatation. Spleen: No splenomegaly. No focal mass lesion. Adrenals/Urinary Tract: No adrenal nodule or mass. Kidneys unremarkable. No evidence for hydroureter. The urinary bladder appears normal for the degree of distention. Stomach/Bowel: Stomach is nondistended. No gastric wall thickening. No evidence of outlet obstruction. Duodenum is normally positioned as is the ligament of Treitz. No small bowel wall thickening. No small bowel dilatation. The terminal ileum is normal. No gross colonic mass. No colonic wall thickening. Vascular/Lymphatic: There is abdominal aortic atherosclerosis without aneurysm. There is no gastrohepatic or hepatoduodenal ligament lymphadenopathy. No intraperitoneal or retroperitoneal lymphadenopathy. Small para-aortic lymph nodes are evident. Borderline lymph nodes are seen in the left external iliac chain measuring 9 mm short axis (98/2) and 8 mm short axis (99/2). Similar small lymph nodes are seen on the right side. Reproductive: The uterus surgically absent. There is no adnexal mass. Other: No intraperitoneal free fluid. Musculoskeletal: Marked laxity of the anterior abdominal wall fascia evident. Degenerative changes noted lumbar spine. IMPRESSION: 1. Upper normal pelvic sidewall lymph nodes bilaterally. Close attention on follow-up recommended. 2. No other findings to suggest metastatic disease in the chest, abdomen, or pelvis. 3.  Aortic Atherosclerois (ICD10-170.0) Electronically Signed    By: EMisty StanleyM.D.   On: 03/05/2018 14:24    I spent 40 minutes counseling the patient face to face. The total time spent in the appointment was 60 minutes and more than 50% was on counseling.  All questions were answered. The patient knows to call the clinic with any problems, questions or concerns.  NHeath Lark MD 03/14/2018 6:54 AM

## 2018-03-14 NOTE — Telephone Encounter (Signed)
-----   Message from Heath Lark, MD sent at 03/14/2018  9:25 AM EST ----- Regarding: UTI Urine culture is pending but urinalysis is abnormal She needs Rx. I recommend nitrofurantoin 100 mg BID x 5 days Please call in to local pharmacy She might still be seen by Dr. Denman George

## 2018-03-15 ENCOUNTER — Ambulatory Visit (INDEPENDENT_AMBULATORY_CARE_PROVIDER_SITE_OTHER): Payer: Medicare Other | Admitting: Family Medicine

## 2018-03-15 ENCOUNTER — Other Ambulatory Visit: Payer: Self-pay | Admitting: Student

## 2018-03-15 ENCOUNTER — Encounter: Payer: Self-pay | Admitting: Family Medicine

## 2018-03-15 ENCOUNTER — Encounter: Payer: Self-pay | Admitting: Oncology

## 2018-03-15 VITALS — BP 145/69 | HR 81 | Temp 97.5°F | Resp 16 | Ht 61.0 in | Wt 287.0 lb

## 2018-03-15 DIAGNOSIS — E119 Type 2 diabetes mellitus without complications: Secondary | ICD-10-CM

## 2018-03-15 LAB — URINE CULTURE

## 2018-03-15 NOTE — Patient Instructions (Signed)
Continue to take Humulin-N as you are currently doing: 65 U with BF, 65 U with lunch, and 61 U with supper.  Sliding scale instructions for your mealtime insulin (Humalog): Give 12 units PLUS: If premeal glucose is <100, give 0 units ""   ""             ""100-150, give 2 units ''      ''             '' 151-200 give 5 units ''     ''             ''  201-250  Give 7 units ''    ''             ''   251-300 give 9 units '      ''            ''   301-350 give 11 units and call to let our office know that you had to give this much.   The day after your 1st chemo treatment, call our office to report the previous day's glucose numbers.--thx

## 2018-03-15 NOTE — Progress Notes (Signed)
OFFICE VISIT  03/15/2018   CC:  Chief Complaint  Patient presents with  . Diabetes    discuss changing insulin, starts chemo Karin Lieu and will also be on steroids    HPI:    Patient is a 67 y.o. Caucasian female with moderate-to-severe persistent asthma and morbid obesity who presents accompanied by her husband for f/u of DM 2. She was recently diagnosed with stage IIIa endometrial ca and underwent TAH/BSO on 02/19/18. She will soon be starting adjuvant chemotherapy with carboplatin and paclitaxel for 6 cycles (one cycle every 3 weeks) under the care of Dr. Alvy Bimler.  DM: she will be getting reduced-dose of premedication dexamethasone with her chemo, and there is certainly concern that these steroids will result in need for more insulin to control her glucoses. Last A1c 10/04/17 was up to 9.4% so I increased her NPH to 65 U qAM, 65 U qlunch, and 61 U q evening. Glucoses consistently 120s average, rare hypog in late afternoon and highest around 170s.  PO intake not as good last 50mo as the prior 3 mo.   Takes 15-20 U humalog with each meal.  Past Medical History:  Diagnosis Date  . Allergic rhinitis    Allergy testing: results pending as of 05/28/16 (Dr. Harold Hedge)  . Anemia   . Arthritis   . Complex endometrial hyperplasia with atypia 12/12   Most recent endometrial bx was 10/2012-NEG  . Cough variant asthma   . DDD (degenerative disc disease), lumbar   . Diabetes mellitus with complication ([place])    Mild nonprolif DR R eye 12/2017-->referred to retinal specialist  . Encounter for insertion of mirena IUD 5/12  . Endometrial cancer ([place]) 02/2018   Stage I.  Path: endometroid adenocarcinoma involving cervix, uterus, and fallopian tubes (Pt got TAH & BSO 02/2018).  . Family history of adverse reaction to anesthesia    brother had severe hypotension  . Fracture, foot 8/14   Hx of left foot stress fracture  . GERD (gastroesophageal reflux disease)   . Hemorrhoids   . Herpes zoster  10/2015   L side belt-line  . History of adenomatous polyp of colon 02/25/13   5 mm cecal polyp removed by Dr. Trevor Mace 5 yrs  . History of blood transfusion   . History of cellulitis 08/2010   Left (Since replacemnt of Left knee  . History of recent dental procedure 02/2018  . Hyperlipidemia    Not on statin b/c lipids "stayed down when sugars came down" per pt.  She says Dr. Chalmers Cater knows she is not on statin anymore.  . Hypertension   . Hypothyroidism   . Nephrolithiasis 4/07  . Obesity   . OSA on CPAP   . Osteoarthritis    knees, ankle, + ? scapholunate ligament disruption (x-ray 06/2017)--ortho referral.  . Sciatica of left side   . Severe persistent asthma    cough-variant--saw Allergist 05/25/16 and was switched from max dose advair to symbicort.  . Systolic murmur    ECHO fine 10/2016-->murmur likely flow murmur assoc with HTN.  Marland Kitchen UTI (lower urinary tract infection)   . Zoon's vulvitis    Bx-proven (GYN) -lichen sclerosis.  Clobetasol 0.05% ointment per GYN    Past Surgical History:  Procedure Laterality Date  . ANKLE FRACTURE SURGERY  1984   Pin & repair  . ARTHROSCOPIC REPAIR ACL  2000   Due to ACL tear  . ARTHROSCOPIC REPAIR ACL  5/08  . Blateral knee rerlacements x4 2 times each knee    .  BREAST BIOPSY Right 2014   Benign  . CARDIAC CATHETERIZATION  5/07   clear vessel mild mitral   . CARPAL TUNNEL RELEASE Right [id number]   1989 Left  . CESAREAN SECTION  1985  . CHOLECYSTECTOMY OPEN  1988  . COLONOSCOPY N/A 02/25/2013   Tubular adenoma x 1: Recall 5 yrs. Procedure: COLONOSCOPY;  Surgeon: Juanita Craver, MD;  Location: [facility];  Service: Endoscopy;  Laterality: N/A;  . COMBINED HYSTEROSCOPY DIAGNOSTIC / D&C  2/12   Bx neg  . DEXA  08/2007   Bone density normal.  . DILATION AND CURETTAGE OF UTERUS  12/11  . LYMPH NODE BIOPSY N/A 02/19/2018   Procedure: Sentinel LYMPH NODE BIOPSY;  Surgeon: Everitt Amber, MD;  Location: [facility];  Service:  Gynecology;  Laterality: N/A;  . REPLACEMENT TOTAL KNEE Left 5/12   X 2 on each  . ROBOTIC ASSISTED TOTAL HYSTERECTOMY WITH BILATERAL SALPINGO OOPHERECTOMY N/A 02/19/2018   For endometrial cancer.  Procedure: XI ROBOTIC ASSISTED TOTAL HYSTERECTOMY WITH BILATERAL SALPINGO OOPHORECTOMY;  Surgeon: Everitt Amber, MD;  Location: [facility];  Service: Gynecology;  Laterality: N/A;  . TRANSTHORACIC ECHOCARDIOGRAM  10/11/2016    EF 55-60%, grd I DD.  . TUBAL LIGATION  1985   C-Section    Outpatient Medications Prior to Visit  Medication Sig Dispense Refill  . albuterol (PROVENTIL HFA;VENTOLIN HFA) 108 (90 Base) MCG/ACT inhaler Inhale 2 puffs into lungs every 6 hours as needed for wheezing or shortness of breath Dx: J45.50 18 Inhaler 3  . albuterol (PROVENTIL) (2.5 MG/3ML) 0.083% nebulizer solution Take 3 mLs (2.5 mg total) by nebulization every 4 (four) hours as needed for wheezing or shortness of breath (Dx: J45.50). 75 mL 1  . clobetasol ointment (TEMOVATE) 0.05 % APPLY TO AFFECTED AREA TWICE A DAY (Patient taking differently: Apply 1 application topically 2 (two) times daily as needed (for irritation). ) 60 g 1  . enalapril (VASOTEC) 10 MG tablet TAKE 1 TABLET BY MOUTH EVERY DAY (Patient taking differently: Take 10 mg by mouth daily. ) 90 tablet 1  . fluticasone (CUTIVATE) 0.05 % cream Apply to affected area of R lower leg twice per day (Patient taking differently: Apply 1 application topically See admin instructions. Apply to affected area of R lower leg twice per day as needed for irritation) 30 g 2  . fluticasone (FLONASE SENSIMIST) 27.5 MCG/SPRAY nasal spray Place 1 spray into the nose daily as needed for rhinitis.    . furosemide (LASIX) 20 MG tablet TAKE 1 TABLET BY MOUTH EVERY DAY (Patient taking differently: Take 20 mg by mouth daily. ) 90 tablet 1  . HYDROcodone-acetaminophen (NORCO/VICODIN) 5-325 MG tablet 1-2 tabs po qd prn pain 60 tablet 0  . insulin aspart (NOVOLOG) 100  UNIT/ML injection USE PER SLIDING SCALE AT EACH MEAL (AVG USE IS 17 UNITS THREE TIMES DAILY) 50 mL 2  . insulin NPH Human (NOVOLIN N) 100 UNIT/ML injection 65 U SQ qAM, 65 U SQ mid day, and 61 U SQ q evening (Patient taking differently: Inject 60 Units into the skin 3 (three) times daily. ) 50 mL 5  . Insulin Syringes, Disposable, U-100 0.3 ML MISC Use to inject insulin--6 injections per day 540 each 3  . levocetirizine (XYZAL) 5 MG tablet Take 5 mg by mouth at bedtime as needed for allergies.   5  . levothyroxine (SYNTHROID, LEVOTHROID) 125 MCG tablet TAKE 1 TABLET BY MOUTH EVERY MORNING ON AN EMPTY STOMACH (Patient taking differently:  Take 125 mcg by mouth daily before breakfast. ) 90 tablet 1  . meloxicam (MOBIC) 15 MG tablet TAKE 1 TABLET BY MOUTH EVERY DAY AS NEEDED (Patient taking differently: Take 15 mg by mouth daily as needed for pain. ) 30 tablet 3  . metFORMIN (GLUCOPHAGE) 500 MG tablet TAKE 1 TABLET BY MOUTH EVERY MORNING AND 2 TABLETS BY MOUTH AT BEDTIME (Patient taking differently: Take 500-1,000 mg by mouth See admin instructions. Take 500 mg by mouth in the morning and take 1000 mg by mouth at supper) 270 tablet 1  . miconazole (MICRO GUARD) 2 % powder Apply 1 application topically 2 (two) times daily as needed for itching.    . montelukast (SINGULAIR) 10 MG tablet Take 10 mg by mouth every evening.     . nitrofurantoin, macrocrystal-monohydrate, (MACROBID) 100 MG capsule Take 1 capsule (100 mg total) by mouth 2 (two) times daily. 10 capsule 0  . pravastatin (PRAVACHOL) 20 MG tablet TAKE 1 TABLET BY MOUTH EVERY DAY (Patient taking differently: Take 20 mg by mouth daily. ) 90 tablet 2  . Probiotic Product (PROBIOTIC PO) Take 1 capsule by mouth daily.    Marland Kitchen senna-docusate (SENOKOT-S) 8.6-50 MG tablet Take 2 tablets by mouth at bedtime. Hold if having loose stools 30 tablet 1  . SYMBICORT 160-4.5 MCG/ACT inhaler Inhale 2 puffs into the lungs 2 (two) times daily.     No  facility-administered medications prior to visit.     Allergies  Allergen Reactions  . Adhesive [Tape] Other (See Comments)    Takes patient's skin off.  . Banana Diarrhea and Nausea Only  . Eggs Or Egg-Derived Products Diarrhea and Nausea Only  . Latex Other (See Comments)    sensativity  . Penicillins Rash and Other (See Comments)    Has had cephalosporins without incident  . Pine Swelling and Rash  . Rose Swelling and Rash    ROS As per HPI  PE: Blood pressure (!) 145/69, pulse 81, temperature (!) 97.5 F (36.4 C), temperature source Oral, resp. rate 16, height 5\' 1"  (1.549 m), weight 287 lb (130.2 kg), last menstrual period 08/10/2010, SpO2 95 %. Gen: Alert, well appearing.  Patient is oriented to person, place, time, and situation. AFFECT: pleasant, lucid thought and speech. No further exam today.  LABS:  Lab Results  Component Value Date   HGBA1C 7.1 (H) 02/15/2018   Lab Results  Component Value Date   WBC 7.6 03/14/2018   HGB 10.8 (L) 03/14/2018   HCT 34.3 (L) 03/14/2018   MCV 95.5 03/14/2018   PLT 229 03/14/2018     Chemistry      Component Value Date/Time   NA 139 03/14/2018 0828   K 4.4 03/14/2018 0828   CL 105 03/14/2018 0828   CO2 23 03/14/2018 0828   BUN 22 03/14/2018 0828   CREATININE 1.03 (H) 03/14/2018 0828      Component Value Date/Time   CALCIUM 9.0 03/14/2018 0828   ALKPHOS 65 03/14/2018 0828   AST 26 03/14/2018 0828   ALT 23 03/14/2018 0828   BILITOT 0.3 03/14/2018 0828     Lab Results  Component Value Date   TSH 1.49 02/13/2017    IMPRESSION AND PLAN:  DM 2, poor control historically, but improved recently. Most recent a1c 7.1% 1 month ago. Will plan on watching how glucoses run as she gets dexamethazone with her chemo. Plan printed out for patient:   Continue to take Humulin-N as you are currently doing: 65 U  with BF, 65 U with lunch, and 61 U with supper.  Sliding scale instructions for your mealtime insulin  (Humalog): Give 12 units PLUS: If premeal glucose is <100, give 0 units ""   ""             ""100-150, give 2 units ''      ''             '' 151-200 give 5 units ''     ''             ''  201-250  Give 7 units ''    ''             ''   251-300 give 9 units '      ''            ''   301-350 give 11 units and call to let our office know that you had to give this much.   The day after your 1st chemo treatment, call our office to report the previous day's glucose numbers.--thx  An After Visit Summary was printed and given to the patient.  FOLLOW UP: Return in about 2 months (around 05/16/2018).  Signed:  Crissie Sickles, MD           03/15/2018

## 2018-03-16 ENCOUNTER — Encounter (HOSPITAL_COMMUNITY): Payer: Self-pay

## 2018-03-16 ENCOUNTER — Ambulatory Visit (HOSPITAL_COMMUNITY)
Admission: RE | Admit: 2018-03-16 | Discharge: 2018-03-16 | Disposition: A | Discharge status: Home / Self Care | Payer: Medicare Other | Source: Ambulatory Visit | Attending: Hematology and Oncology | Admitting: Hematology and Oncology

## 2018-03-16 DIAGNOSIS — E669 Obesity, unspecified: Secondary | ICD-10-CM | POA: Diagnosis not present

## 2018-03-16 DIAGNOSIS — E039 Hypothyroidism, unspecified: Secondary | ICD-10-CM | POA: Insufficient documentation

## 2018-03-16 DIAGNOSIS — Z7951 Long term (current) use of inhaled steroids: Secondary | ICD-10-CM | POA: Insufficient documentation

## 2018-03-16 DIAGNOSIS — J4550 Severe persistent asthma, uncomplicated: Secondary | ICD-10-CM | POA: Diagnosis not present

## 2018-03-16 DIAGNOSIS — Z975 Presence of (intrauterine) contraceptive device: Secondary | ICD-10-CM | POA: Diagnosis not present

## 2018-03-16 DIAGNOSIS — G4733 Obstructive sleep apnea (adult) (pediatric): Secondary | ICD-10-CM | POA: Diagnosis not present

## 2018-03-16 DIAGNOSIS — Z955 Presence of coronary angioplasty implant and graft: Secondary | ICD-10-CM | POA: Diagnosis present

## 2018-03-16 DIAGNOSIS — Z5111 Encounter for antineoplastic chemotherapy: Secondary | ICD-10-CM | POA: Diagnosis not present

## 2018-03-16 DIAGNOSIS — E785 Hyperlipidemia, unspecified: Secondary | ICD-10-CM | POA: Insufficient documentation

## 2018-03-16 DIAGNOSIS — C541 Malignant neoplasm of endometrium: Secondary | ICD-10-CM | POA: Insufficient documentation

## 2018-03-16 DIAGNOSIS — Z91040 Latex allergy status: Secondary | ICD-10-CM | POA: Diagnosis not present

## 2018-03-16 DIAGNOSIS — Z823 Family history of stroke: Secondary | ICD-10-CM | POA: Insufficient documentation

## 2018-03-16 DIAGNOSIS — Z90710 Acquired absence of both cervix and uterus: Secondary | ICD-10-CM | POA: Insufficient documentation

## 2018-03-16 DIAGNOSIS — Z888 Allergy status to other drugs, medicaments and biological substances status: Secondary | ICD-10-CM | POA: Insufficient documentation

## 2018-03-16 DIAGNOSIS — K219 Gastro-esophageal reflux disease without esophagitis: Secondary | ICD-10-CM | POA: Insufficient documentation

## 2018-03-16 DIAGNOSIS — E119 Type 2 diabetes mellitus without complications: Secondary | ICD-10-CM | POA: Diagnosis not present

## 2018-03-16 DIAGNOSIS — Z91018 Allergy to other foods: Secondary | ICD-10-CM | POA: Insufficient documentation

## 2018-03-16 DIAGNOSIS — Z794 Long term (current) use of insulin: Secondary | ICD-10-CM | POA: Insufficient documentation

## 2018-03-16 DIAGNOSIS — Z91012 Allergy to eggs: Secondary | ICD-10-CM | POA: Diagnosis not present

## 2018-03-16 DIAGNOSIS — Z79899 Other long term (current) drug therapy: Secondary | ICD-10-CM | POA: Insufficient documentation

## 2018-03-16 DIAGNOSIS — Z8249 Family history of ischemic heart disease and other diseases of the circulatory system: Secondary | ICD-10-CM | POA: Insufficient documentation

## 2018-03-16 DIAGNOSIS — Z90722 Acquired absence of ovaries, bilateral: Secondary | ICD-10-CM | POA: Insufficient documentation

## 2018-03-16 DIAGNOSIS — I10 Essential (primary) hypertension: Secondary | ICD-10-CM | POA: Diagnosis not present

## 2018-03-16 DIAGNOSIS — Z880 Allergy status to penicillin: Secondary | ICD-10-CM | POA: Diagnosis not present

## 2018-03-16 DIAGNOSIS — Z9109 Other allergy status, other than to drugs and biological substances: Secondary | ICD-10-CM | POA: Diagnosis not present

## 2018-03-16 DIAGNOSIS — Z833 Family history of diabetes mellitus: Secondary | ICD-10-CM | POA: Diagnosis not present

## 2018-03-16 DIAGNOSIS — Z9049 Acquired absence of other specified parts of digestive tract: Secondary | ICD-10-CM | POA: Diagnosis not present

## 2018-03-16 DIAGNOSIS — Z803 Family history of malignant neoplasm of breast: Secondary | ICD-10-CM | POA: Insufficient documentation

## 2018-03-16 DIAGNOSIS — Z825 Family history of asthma and other chronic lower respiratory diseases: Secondary | ICD-10-CM | POA: Insufficient documentation

## 2018-03-16 HISTORY — PX: IR IMAGING GUIDED PORT INSERTION: IMG5740

## 2018-03-16 LAB — GLUCOSE, CAPILLARY
Glucose-Capillary: 62 mg/dL — ABNORMAL LOW (ref 70–99)
Glucose-Capillary: 104 mg/dL — ABNORMAL HIGH (ref 70–99)
Glucose-Capillary: 77 mg/dL (ref 70–99)

## 2018-03-16 LAB — PROTIME-INR
INR: 0.94
Prothrombin Time: 12.4 seconds (ref 11.4–15.2)

## 2018-03-16 MED ORDER — VANCOMYCIN HCL 10 G IV SOLR
1500.0000 mg | Freq: Once | INTRAVENOUS | Status: AC
  Administered 2018-03-16: 1500 mg via INTRAVENOUS
  Filled 2018-03-16: qty 1500

## 2018-03-16 MED ORDER — DEXTROSE 5 % IV SOLN
INTRAVENOUS | Status: DC
  Administered 2018-03-16: 13:00:00 via INTRAVENOUS

## 2018-03-16 MED ORDER — FENTANYL CITRATE (PF) 100 MCG/2ML IJ SOLN
INTRAMUSCULAR | Status: AC
  Filled 2018-03-16: qty 2

## 2018-03-16 MED ORDER — DEXTROSE 50 % IV SOLN
INTRAVENOUS | Status: AC
  Filled 2018-03-16: qty 50

## 2018-03-16 MED ORDER — LIDOCAINE-EPINEPHRINE (PF) 2 %-1:200000 IJ SOLN
INTRAMUSCULAR | Status: AC
  Filled 2018-03-16: qty 10

## 2018-03-16 MED ORDER — MIDAZOLAM HCL 2 MG/2ML IJ SOLN
INTRAMUSCULAR | Status: AC | PRN
  Administered 2018-03-16 (×4): 1 mg via INTRAVENOUS

## 2018-03-16 MED ORDER — LIDOCAINE-EPINEPHRINE (PF) 2 %-1:200000 IJ SOLN
INTRAMUSCULAR | Status: AC | PRN
  Administered 2018-03-16 (×3): 5 mL

## 2018-03-16 MED ORDER — MIDAZOLAM HCL 2 MG/2ML IJ SOLN
INTRAMUSCULAR | Status: AC
  Filled 2018-03-16: qty 2

## 2018-03-16 MED ORDER — FENTANYL CITRATE (PF) 100 MCG/2ML IJ SOLN
INTRAMUSCULAR | Status: AC | PRN
  Administered 2018-03-16 (×2): 50 ug via INTRAVENOUS

## 2018-03-16 MED ORDER — DEXTROSE 50 % IV SOLN
25.0000 mL | INTRAVENOUS | Status: AC
  Administered 2018-03-16: 25 mL via INTRAVENOUS

## 2018-03-16 MED ORDER — HEPARIN SOD (PORK) LOCK FLUSH 100 UNIT/ML IV SOLN
INTRAVENOUS | Status: AC
  Filled 2018-03-16: qty 5

## 2018-03-16 MED ORDER — HEPARIN SOD (PORK) LOCK FLUSH 100 UNIT/ML IV SOLN
INTRAVENOUS | Status: AC | PRN
  Administered 2018-03-16: 500 [IU] via INTRAVENOUS

## 2018-03-16 MED ORDER — SODIUM CHLORIDE 0.9 % IV SOLN: INTRAVENOUS | Status: DC

## 2018-03-16 NOTE — H&P (Signed)
Chief Complaint: Patient was seen in consultation today for port placement at the request of [place]  Referring Physician(s): Adrian  Supervising Physician: Daryll Brod  Patient Status: [facility] - Out-pt  History of Present Illness: [name] is a 67 y.o. female with endometrial cancer. She is to start chemotherapy next week and is referred for port placement. PMHx, meds, labs, imaging, allergies reviewed. Feels well, no recent fevers, chills, illness. Has been NPO today as directed.   Past Medical History:  Diagnosis Date  . Allergic rhinitis    Allergy testing: results pending as of 05/28/16 (Dr. Harold Hedge)  . Anemia   . Arthritis   . Complex endometrial hyperplasia with atypia 12/12   Most recent endometrial bx was 10/2012-NEG  . Cough variant asthma   . DDD (degenerative disc disease), lumbar   . Diabetes mellitus with complication ([place])    Mild nonprolif DR R eye 12/2017-->referred to retinal specialist  . Encounter for insertion of mirena IUD 5/12  . Endometrial cancer ([place]) 02/2018   Stage I.  Path: endometroid adenocarcinoma involving cervix, uterus, and fallopian tubes (Pt got TAH & BSO 02/2018).  . Family history of adverse reaction to anesthesia    brother had severe hypotension  . Fracture, foot 8/14   Hx of left foot stress fracture  . GERD (gastroesophageal reflux disease)   . Hemorrhoids   . Herpes zoster 10/2015   L side belt-line  . History of adenomatous polyp of colon 02/25/13   5 mm cecal polyp removed by Dr. Trevor Mace 5 yrs  . History of blood transfusion   . History of cellulitis 08/2010   Left (Since replacemnt of Left knee  . History of recent dental procedure 02/2018  . Hyperlipidemia    Not on statin b/c lipids "stayed down when sugars came down" per pt.  She says Dr. Chalmers Cater knows she is not on statin anymore.  . Hypertension   . Hypothyroidism   . Nephrolithiasis 4/07  . Obesity   . OSA on CPAP   . Osteoarthritis    knees, ankle, + ? scapholunate ligament disruption (x-ray 06/2017)--ortho referral.  . Sciatica of left side   . Severe persistent asthma    cough-variant--saw Allergist 05/25/16 and was switched from max dose advair to symbicort.  . Systolic murmur    ECHO fine 10/2016-->murmur likely flow murmur assoc with HTN.  Marland Kitchen UTI (lower urinary tract infection)   . Zoon's vulvitis    Bx-proven (GYN) -lichen sclerosis.  Clobetasol 0.05% ointment per GYN    Past Surgical History:  Procedure Laterality Date  . ANKLE FRACTURE SURGERY  1984   Pin & repair  . ARTHROSCOPIC REPAIR ACL  2000   Due to ACL tear  . ARTHROSCOPIC REPAIR ACL  5/08  . Blateral knee rerlacements x4 2 times each knee    . BREAST BIOPSY Right 2014   Benign  . CARDIAC CATHETERIZATION  5/07   clear vessel mild mitral   . CARPAL TUNNEL RELEASE Right [id number]   1989 Left  . CESAREAN SECTION  1985  . CHOLECYSTECTOMY OPEN  1988  . COLONOSCOPY N/A 02/25/2013   Tubular adenoma x 1: Recall 5 yrs. Procedure: COLONOSCOPY;  Surgeon: Juanita Craver, MD;  Location: [facility];  Service: Endoscopy;  Laterality: N/A;  . COMBINED HYSTEROSCOPY DIAGNOSTIC / D&C  2/12   Bx neg  . DEXA  08/2007   Bone density normal.  . DILATION AND CURETTAGE OF UTERUS  12/11  . LYMPH NODE  BIOPSY N/A 02/19/2018   Procedure: Sentinel LYMPH NODE BIOPSY;  Surgeon: Everitt Amber, MD;  Location: [facility];  Service: Gynecology;  Laterality: N/A;  . REPLACEMENT TOTAL KNEE Left 5/12   X 2 on each  . ROBOTIC ASSISTED TOTAL HYSTERECTOMY WITH BILATERAL SALPINGO OOPHERECTOMY N/A 02/19/2018   For endometrial cancer.  Procedure: XI ROBOTIC ASSISTED TOTAL HYSTERECTOMY WITH BILATERAL SALPINGO OOPHORECTOMY;  Surgeon: Everitt Amber, MD;  Location: [place];  Service: Gynecology;  Laterality: N/A;  . TRANSTHORACIC ECHOCARDIOGRAM  10/11/2016    EF 55-60%, grd I DD.  . TUBAL LIGATION  1985   C-Section    Allergies: Adhesive [tape]; Banana; Eggs  or egg-derived products; Latex; Penicillins; Pine; and Rose  Medications: Prior to Admission medications   Medication Sig Start Date End Date Taking? Authorizing Provider  albuterol (PROVENTIL HFA;VENTOLIN HFA) 108 (90 Base) MCG/ACT inhaler Inhale 2 puffs into lungs every 6 hours as needed for wheezing or shortness of breath Dx: J45.50 07/04/17  Yes McGowen, Adrian Blackwater, MD  enalapril (VASOTEC) 10 MG tablet TAKE 1 TABLET BY MOUTH EVERY DAY Patient taking differently: Take 10 mg by mouth daily.  10/27/17  Yes McGowen, Adrian Blackwater, MD  fluticasone (CUTIVATE) 0.05 % cream Apply to affected area of R lower leg twice per day Patient taking differently: Apply 1 application topically See admin instructions. Apply to affected area of R lower leg twice per day as needed for irritation 10/04/17  Yes McGowen, Adrian Blackwater, MD  fluticasone (FLONASE SENSIMIST) 27.5 MCG/SPRAY nasal spray Place 1 spray into the nose daily as needed for rhinitis.   Yes [provider]  furosemide (LASIX) 20 MG tablet TAKE 1 TABLET BY MOUTH EVERY DAY Patient taking differently: Take 20 mg by mouth daily.  12/25/17  Yes McGowen, Adrian Blackwater, MD  HYDROcodone-acetaminophen (NORCO/VICODIN) 5-325 MG tablet 1-2 tabs po qd prn pain 01/03/18  Yes McGowen, Adrian Blackwater, MD  insulin aspart (NOVOLOG) 100 UNIT/ML injection USE PER SLIDING SCALE AT [facility] MEAL (AVG USE IS 17 UNITS THREE TIMES DAILY) 02/22/18  Yes McGowen, Adrian Blackwater, MD  insulin NPH Human (NOVOLIN N) 100 UNIT/ML injection 65 U SQ qAM, 65 U SQ mid day, and 61 U SQ q evening Patient taking differently: Inject 60 Units into the skin 3 (three) times daily.  01/25/18  Yes McGowen, Adrian Blackwater, MD  levocetirizine (XYZAL) 5 MG tablet Take 5 mg by mouth at bedtime as needed for allergies.  01/30/17  Yes [provider]  levothyroxine (SYNTHROID, LEVOTHROID) 125 MCG tablet TAKE 1 TABLET BY MOUTH EVERY MORNING ON AN EMPTY STOMACH Patient taking differently: Take 125 mcg by mouth daily before  breakfast.  09/25/17  Yes McGowen, Adrian Blackwater, MD  metFORMIN (GLUCOPHAGE) 500 MG tablet TAKE 1 TABLET BY MOUTH EVERY MORNING AND 2 TABLETS BY MOUTH AT BEDTIME Patient taking differently: Take 500-1,000 mg by mouth See admin instructions. Take 500 mg by mouth in the morning and take 1000 mg by mouth at supper 10/27/17  Yes McGowen, Adrian Blackwater, MD  miconazole (MICRO GUARD) 2 % powder Apply 1 application topically 2 (two) times daily as needed for itching.   Yes [provider]  montelukast (SINGULAIR) 10 MG tablet Take 10 mg by mouth every evening.  07/19/16  Yes [provider]  nitrofurantoin, macrocrystal-monohydrate, (MACROBID) 100 MG capsule Take 1 capsule (100 mg total) by mouth 2 (two) times daily. 03/14/18  Yes Gorsuch, Ni, MD  pravastatin (PRAVACHOL) 20 MG tablet TAKE 1 TABLET BY  MOUTH EVERY DAY Patient taking differently: Take 20 mg by mouth daily.  08/09/17  Yes McGowen, Adrian Blackwater, MD  Probiotic Product (PROBIOTIC PO) Take 1 capsule by mouth daily.   Yes [provider]  senna-docusate (SENOKOT-S) 8.6-50 MG tablet Take 2 tablets by mouth at bedtime. Hold if having loose stools 02/20/18  Yes Cross, Melissa D, NP  SYMBICORT 160-4.5 MCG/ACT inhaler Inhale 2 puffs into the lungs 2 (two) times daily. 08/06/16  Yes [provider]  albuterol (PROVENTIL) (2.5 MG/3ML) 0.083% nebulizer solution Take 3 mLs (2.5 mg total) by nebulization every 4 (four) hours as needed for wheezing or shortness of breath (Dx: J45.50). 07/04/17   McGowen, Adrian Blackwater, MD  clobetasol ointment (TEMOVATE) 0.05 % APPLY TO AFFECTED AREA TWICE A DAY Patient taking differently: Apply 1 application topically 2 (two) times daily as needed (for irritation).  02/01/17   Megan Salon, MD  Insulin Syringes, Disposable, U-100 0.3 ML MISC Use to inject insulin--6 injections per day 02/05/18   McGowen, Adrian Blackwater, MD  meloxicam (MOBIC) 15 MG tablet TAKE 1 TABLET BY MOUTH EVERY DAY AS NEEDED Patient taking  differently: Take 15 mg by mouth daily as needed for pain.  11/27/17   McGowen, Adrian Blackwater, MD     Family History  Problem Relation Age of Onset  . Diabetes Father   . COPD Father   . Stroke Father   . Celiac disease Brother   . Rheum arthritis Brother   . Thyroid disease Brother   . Diabetes Brother   . Alzheimer's disease Mother   . Arthritis Mother   . Other Son        Died age 56 -Tetrology of Fallot - VSD/pulmonary atresia  . Breast cancer Other        postmenopausal when diagnosed    Social History   Socioeconomic History  . Marital status: Married    Spouse name: Louie Casa  . Number of children: 2  . Years of education: Not on file  . Highest education level: Not on file  Occupational History  . Occupation: retired Animal nutritionist  . Financial resource strain: Not on file  . Food insecurity:    Worry: Not on file    Inability: Not on file  . Transportation needs:    Medical: Not on file    Non-medical: Not on file  Tobacco Use  . Smoking status: Never Smoker  . Smokeless tobacco: Never Used  Substance and Sexual Activity  . Alcohol use: No    Alcohol/week: 0.0 standard drinks  . Drug use: No  . Sexual activity: Never    Partners: Male    Birth control/protection: IUD  Lifestyle  . Physical activity:    Days per week: Not on file    Minutes per session: Not on file  . Stress: Not on file  Relationships  . Social connections:    Talks on phone: Not on file    Gets together: Not on file    Attends religious service: Not on file    Active member of club or organization: Not on file    Attends meetings of clubs or organizations: Not on file    Relationship status: Not on file  Other Topics Concern  . Not on file  Social History Narrative  . Not on file     Review of Systems: A 12 point ROS discussed and pertinent positives are indicated in the HPI above.  All other systems are negative.  Review of Systems  Vital Signs: BP (!) 163/55 (BP Location:  Left Wrist)   Pulse 80   Temp 97.9 F (36.6 C) (Oral)   Resp 18   LMP 08/10/2010 Comment: Hysterectomy 02/02/18  SpO2 97%   Physical Exam  Constitutional: She is oriented to person, place, and time. She appears well-developed and well-nourished. No distress.  HENT:  Head: Normocephalic.  Mouth/Throat: Oropharynx is clear and moist.  Neck: Normal range of motion. No JVD present. No tracheal deviation present.  Cardiovascular: Normal rate, regular rhythm and normal heart sounds.  Pulmonary/Chest: Effort normal and breath sounds normal. No respiratory distress.  Neurological: She is alert and oriented to person, place, and time.  Skin: Skin is warm and dry.  Psychiatric: She has a normal mood and affect. Judgment normal.      Imaging: Ct Chest W Contrast  Result Date: 03/05/2018 CLINICAL DATA:  Endometrial cancer. Status post hysterectomy 02/19/2018 EXAM: CT CHEST, ABDOMEN, AND PELVIS WITH CONTRAST TECHNIQUE: Multidetector CT imaging of the chest, abdomen and pelvis was performed following the standard protocol during bolus administration of intravenous contrast. CONTRAST:  143mL OMNIPAQUE IOHEXOL 300 MG/ML  SOLN COMPARISON:  Chest CT 10/04/2016. FINDINGS: CT CHEST FINDINGS Cardiovascular: Heart size upper normal to mildly increased. No substantial pericardial effusion. Atherosclerotic calcification is noted in the wall of the thoracic aorta. Mediastinum/Nodes: No mediastinal lymphadenopathy. There is no hilar lymphadenopathy. The esophagus has normal imaging features. There is no axillary lymphadenopathy. Lungs/Pleura: The central tracheobronchial airways are patent. No suspicious pulmonary nodule or mass. No pleural effusion. Musculoskeletal: No worrisome lytic or sclerotic osseous abnormality. CT ABDOMEN PELVIS FINDINGS Hepatobiliary: No focal abnormality within the liver parenchyma. Gallbladder surgically absent. No intrahepatic or extrahepatic biliary dilation. Pancreas: Diffuse atrophy  of pancreatic parenchyma without main ductal dilatation. Spleen: No splenomegaly. No focal mass lesion. Adrenals/Urinary Tract: No adrenal nodule or mass. Kidneys unremarkable. No evidence for hydroureter. The urinary bladder appears normal for the degree of distention. Stomach/Bowel: Stomach is nondistended. No gastric wall thickening. No evidence of outlet obstruction. Duodenum is normally positioned as is the ligament of Treitz. No small bowel wall thickening. No small bowel dilatation. The terminal ileum is normal. No gross colonic mass. No colonic wall thickening. Vascular/Lymphatic: There is abdominal aortic atherosclerosis without aneurysm. There is no gastrohepatic or hepatoduodenal ligament lymphadenopathy. No intraperitoneal or retroperitoneal lymphadenopathy. Small para-aortic lymph nodes are evident. Borderline lymph nodes are seen in the left external iliac chain measuring 9 mm short axis (98/2) and 8 mm short axis (99/2). Similar small lymph nodes are seen on the right side. Reproductive: The uterus surgically absent. There is no adnexal mass. Other: No intraperitoneal free fluid. Musculoskeletal: Marked laxity of the anterior abdominal wall fascia evident. Degenerative changes noted lumbar spine. IMPRESSION: 1. Upper normal pelvic sidewall lymph nodes bilaterally. Close attention on follow-up recommended. 2. No other findings to suggest metastatic disease in the chest, abdomen, or pelvis. 3.  Aortic Atherosclerois (ICD10-170.0) Electronically Signed   By: Misty Stanley M.D.   On: 03/05/2018 14:24   Ct Abdomen Pelvis W Contrast  Result Date: 03/05/2018 CLINICAL DATA:  Endometrial cancer. Status post hysterectomy 02/19/2018 EXAM: CT CHEST, ABDOMEN, AND PELVIS WITH CONTRAST TECHNIQUE: Multidetector CT imaging of the chest, abdomen and pelvis was performed following the standard protocol during bolus administration of intravenous contrast. CONTRAST:  137mL OMNIPAQUE IOHEXOL 300 MG/ML  SOLN  COMPARISON:  Chest CT 10/04/2016. FINDINGS: CT CHEST FINDINGS Cardiovascular: Heart size upper normal to mildly increased. No substantial pericardial effusion.  Atherosclerotic calcification is noted in the wall of the thoracic aorta. Mediastinum/Nodes: No mediastinal lymphadenopathy. There is no hilar lymphadenopathy. The esophagus has normal imaging features. There is no axillary lymphadenopathy. Lungs/Pleura: The central tracheobronchial airways are patent. No suspicious pulmonary nodule or mass. No pleural effusion. Musculoskeletal: No worrisome lytic or sclerotic osseous abnormality. CT ABDOMEN PELVIS FINDINGS Hepatobiliary: No focal abnormality within the liver parenchyma. Gallbladder surgically absent. No intrahepatic or extrahepatic biliary dilation. Pancreas: Diffuse atrophy of pancreatic parenchyma without main ductal dilatation. Spleen: No splenomegaly. No focal mass lesion. Adrenals/Urinary Tract: No adrenal nodule or mass. Kidneys unremarkable. No evidence for hydroureter. The urinary bladder appears normal for the degree of distention. Stomach/Bowel: Stomach is nondistended. No gastric wall thickening. No evidence of outlet obstruction. Duodenum is normally positioned as is the ligament of Treitz. No small bowel wall thickening. No small bowel dilatation. The terminal ileum is normal. No gross colonic mass. No colonic wall thickening. Vascular/Lymphatic: There is abdominal aortic atherosclerosis without aneurysm. There is no gastrohepatic or hepatoduodenal ligament lymphadenopathy. No intraperitoneal or retroperitoneal lymphadenopathy. Small para-aortic lymph nodes are evident. Borderline lymph nodes are seen in the left external iliac chain measuring 9 mm short axis (98/2) and 8 mm short axis (99/2). Similar small lymph nodes are seen on the right side. Reproductive: The uterus surgically absent. There is no adnexal mass. Other: No intraperitoneal free fluid. Musculoskeletal: Marked laxity of the  anterior abdominal wall fascia evident. Degenerative changes noted lumbar spine. IMPRESSION: 1. Upper normal pelvic sidewall lymph nodes bilaterally. Close attention on follow-up recommended. 2. No other findings to suggest metastatic disease in the chest, abdomen, or pelvis. 3.  Aortic Atherosclerois (ICD10-170.0) Electronically Signed   By: Misty Stanley M.D.   On: 03/05/2018 14:24    Labs:  CBC: Recent Labs    10/04/17 1415 02/15/18 1514 02/20/18 0325 03/14/18 0828  WBC 8.0 8.8 9.9 7.6  HGB 11.7* 11.2* 9.7* 10.8*  HCT 35.2* 36.0 31.8* 34.3*  PLT 251.0 240 203 229    COAGS: Recent Labs    03/16/18 1155  INR 0.94    BMP: Recent Labs    10/04/17 1415 02/15/18 1514 02/20/18 0325 03/14/18 0828  NA 141 140 138 139  K 4.7 4.7 5.0 4.4  CL 105 103 104 105  CO2 28 28 24 23   GLUCOSE 106* 100* 255* 171*  BUN 29* 23 23 22   CALCIUM 9.8 9.0 8.6* 9.0  CREATININE 0.89 1.04* 1.02* 1.03*  GFRNONAA  --  54* 56* 56*  GFRAA  --  >60 >60 >60    LIVER FUNCTION TESTS: Recent Labs    02/15/18 1514 03/14/18 0828  BILITOT 0.5 0.3  AST 39 26  ALT 30 23  ALKPHOS 49 65  PROT 7.3 7.1  ALBUMIN 3.8 3.3*    TUMOR MARKERS: No results for input(s): AFPTM, CEA, CA199, CHROMGRNA in the last 8760 hours.  Assessment and Plan: Endometrial cancer For port placement Labs ok Risks and benefits of image guided port-a-catheter placement was discussed with the patient including, but not limited to bleeding, infection, pneumothorax, or fibrin sheath development and need for additional procedures.  All of the patient's questions were answered, patient is agreeable to proceed. Consent signed and in chart.    Thank you for this interesting consult.  I greatly enjoyed meeting [name] and look forward to participating in their care.  A copy of this report was sent to the requesting provider on this date.  Electronically Signed: Ascencion Dike, PA-C 03/16/2018,  12:59 PM   I spent a  total of 20 minutes in face to face in clinical consultation, greater than 50% of which was counseling/coordinating care for port placement

## 2018-03-16 NOTE — Progress Notes (Signed)
Called pt's husbands cell phone # to confirm he was her ride.  Left message on voicemail to call [phone] to confirm he was ride.  Pt states he is at work and he gets off at 4pm today, pt states he knows to come back to hospital when he gets off.

## 2018-03-16 NOTE — Discharge Instructions (Addendum)
Implanted Port Insertion, Care After °This sheet gives you information about how to care for yourself after your procedure. Your health care provider may also give you more specific instructions. If you have problems or questions, contact your health care provider. °What can I expect after the procedure? °After your procedure, it is common to have: °· Discomfort at the port insertion site. °· Bruising on the skin over the port. This should improve over 3-4 days. ° °Follow these instructions at home: °Port care °· After your port is placed, you will get a manufacturer's information card. The card has information about your port. Keep this card with you at all times. °· Take care of the port as told by your health care provider. Ask your health care provider if you or a family member can get training for taking care of the port at home. A home health care nurse may also take care of the port. °· Make sure to remember what type of port you have. °Incision care °· Follow instructions from your health care provider about how to take care of your port insertion site. Make sure you: °? Wash your hands with soap and water before you change your bandage (dressing). If soap and water are not available, use hand sanitizer. °? Change your dressing as told by your health care provider. °? Leave stitches (sutures), skin glue, or adhesive strips in place. These skin closures may need to stay in place for 2 weeks or longer. If adhesive strip edges start to loosen and curl up, you may trim the loose edges. Do not remove adhesive strips completely unless your health care provider tells you to do that. °· Check your port insertion site every day for signs of infection. Check for: °? More redness, swelling, or pain. °? More fluid or blood. °? Warmth. °? Pus or a bad smell. °General instructions °· Do not take baths, swim, or use a hot tub until your health care provider approves. °· Do not lift anything that is heavier than 10 lb (4.5  kg) for a week, or as told by your health care provider. °· Ask your health care provider when it is okay to: °? Return to work or school. °? Resume usual physical activities or sports. °· Do not drive for 24 hours if you were given a medicine to help you relax (sedative). °· Take over-the-counter and prescription medicines only as told by your health care provider. °· Wear a medical alert bracelet in case of an emergency. This will tell any health care providers that you have a port. °· Keep all follow-up visits as told by your health care provider. This is important. °Contact a health care provider if: °· You cannot flush your port with saline as directed, or you cannot draw blood from the port. °· You have a fever or chills. °· You have more redness, swelling, or pain around your port insertion site. °· You have more fluid or blood coming from your port insertion site. °· Your port insertion site feels warm to the touch. °· You have pus or a bad smell coming from the port insertion site. °Get help right away if: °· You have chest pain or shortness of breath. °· You have bleeding from your port that you cannot control. °Summary °· Take care of the port as told by your health care provider. °· Change your dressing as told by your health care provider. °· Keep all follow-up visits as told by your health care provider. °  This information is not intended to replace advice given to you by your health care provider. Make sure you discuss any questions you have with your health care provider. °Document Released: 01/16/2013 Document Revised: 02/17/2016 Document Reviewed: 02/17/2016 °Elsevier Interactive Patient Education © 2017 [employer] °Moderate Conscious Sedation, Adult, Care After °These instructions provide you with information about caring for yourself after your procedure. Your health care provider may also give you more specific instructions. Your treatment has been planned according to current medical  practices, but problems sometimes occur. Call your health care provider if you have any problems or questions after your procedure. °What can I expect after the procedure? °After your procedure, it is common: °· To feel sleepy for several hours. °· To feel clumsy and have poor balance for several hours. °· To have poor judgment for several hours. °· To vomit if you eat too soon. ° °Follow these instructions at home: °For at least 24 hours after the procedure: ° °· Do not: °? Participate in activities where you could fall or become injured. °? Drive. °? Use heavy machinery. °? Drink alcohol. °? Take sleeping pills or medicines that cause drowsiness. °? Make important decisions or sign legal documents. °? Take care of children on your own. °· Rest. °Eating and drinking °· Follow the diet recommended by your health care provider. °· If you vomit: °? Drink water, juice, or soup when you can drink without vomiting. °? Make sure you have little or no nausea before eating solid foods. °General instructions °· Have a responsible adult stay with you until you are awake and alert. °· Take over-the-counter and prescription medicines only as told by your health care provider. °· If you smoke, do not smoke without supervision. °· Keep all follow-up visits as told by your health care provider. This is important. °Contact a health care provider if: °· You keep feeling nauseous or you keep vomiting. °· You feel light-headed. °· You develop a rash. °· You have a fever. °Get help right away if: °· You have trouble breathing. °This information is not intended to replace advice given to you by your health care provider. Make sure you discuss any questions you have with your health care provider. °Document Released: 01/16/2013 Document Revised: 08/31/2015 Document Reviewed: 07/18/2015 °Elsevier Interactive Patient Education © 2018 [employer] ° °

## 2018-03-16 NOTE — Procedures (Signed)
Endometrial ca  S/p RT IJ POWER PORT Tip svcra No comp Stable EBL 0 Ready for use Full report in pacs

## 2018-03-17 ENCOUNTER — Other Ambulatory Visit: Payer: Self-pay | Admitting: Family Medicine

## 2018-03-19 ENCOUNTER — Encounter: Payer: Self-pay | Admitting: Hematology and Oncology

## 2018-03-19 ENCOUNTER — Inpatient Hospital Stay (HOSPITAL_BASED_OUTPATIENT_CLINIC_OR_DEPARTMENT_OTHER): Payer: Medicare Other | Admitting: Hematology and Oncology

## 2018-03-19 ENCOUNTER — Inpatient Hospital Stay: Payer: Medicare Other

## 2018-03-19 VITALS — BP 162/48 | HR 82 | Temp 97.6°F | Resp 18 | Ht 61.0 in | Wt 284.8 lb

## 2018-03-19 DIAGNOSIS — R300 Dysuria: Secondary | ICD-10-CM | POA: Diagnosis not present

## 2018-03-19 DIAGNOSIS — C541 Malignant neoplasm of endometrium: Secondary | ICD-10-CM

## 2018-03-19 DIAGNOSIS — Z794 Long term (current) use of insulin: Secondary | ICD-10-CM | POA: Diagnosis not present

## 2018-03-19 DIAGNOSIS — IMO0001 Reserved for inherently not codable concepts without codable children: Secondary | ICD-10-CM

## 2018-03-19 DIAGNOSIS — Z90722 Acquired absence of ovaries, bilateral: Secondary | ICD-10-CM | POA: Diagnosis not present

## 2018-03-19 DIAGNOSIS — Z90710 Acquired absence of both cervix and uterus: Secondary | ICD-10-CM | POA: Diagnosis not present

## 2018-03-19 DIAGNOSIS — Z79899 Other long term (current) drug therapy: Secondary | ICD-10-CM

## 2018-03-19 DIAGNOSIS — E11319 Type 2 diabetes mellitus with unspecified diabetic retinopathy without macular edema: Secondary | ICD-10-CM

## 2018-03-19 MED ORDER — ONDANSETRON HCL 8 MG PO TABS: 8.0000 mg | ORAL_TABLET | Freq: Three times a day (TID) | ORAL | 1 refills | Status: DC | PRN

## 2018-03-19 MED ORDER — LIDOCAINE-PRILOCAINE 2.5-2.5 % EX CREA: TOPICAL_CREAM | CUTANEOUS | 3 refills | Status: DC

## 2018-03-19 MED ORDER — PROCHLORPERAZINE MALEATE 10 MG PO TABS: 10.0000 mg | ORAL_TABLET | Freq: Four times a day (QID) | ORAL | 1 refills | Status: DC | PRN

## 2018-03-19 MED ORDER — DEXAMETHASONE 4 MG PO TABS: ORAL_TABLET | ORAL | 0 refills | Status: DC

## 2018-03-19 NOTE — Assessment & Plan Note (Signed)
We reviewed the NCCN guidelines We discussed the role of chemotherapy. The intent is of curative intent.  We discussed some of the risks, benefits, side-effects of carboplatin & Taxol. Treatment is intravenous, every 3 weeks x 6 cycles  Some of the short term side-effects included, though not limited to, including weight loss, life threatening infections, risk of allergic reactions, need for transfusions of blood products, nausea, vomiting, change in bowel habits, loss of hair, admission to hospital for various reasons, and risks of death.   Long term side-effects are also discussed including risks of infertility, permanent damage to nerve function, hearing loss, chronic fatigue, kidney damage with possibility needing hemodialysis, and rare secondary malignancy including bone marrow disorders.  The patient is aware that the response rates discussed earlier is not guaranteed.  After a long discussion, patient made an informed decision to proceed with the prescribed plan of care.   Patient education material was dispensed. We discussed premedication with dexamethasone before chemotherapy. Due to her large body mass index, I will put maximum dose of carboplatin at 750 mg and reduce the dose of Taxol upfront.

## 2018-03-19 NOTE — Progress Notes (Signed)
[facility] OFFICE PROGRESS NOTE  Patient Care Team: Tammi Sou, MD as PCP - General (Family Medicine) Harold Hedge, Darrick Grinder, MD as Consulting Physician (Allergy and Immunology) Juanita Craver, MD as Consulting Physician (Gastroenterology) Martinique, Peter M, MD as Consulting Physician (Cardiology) Megan Salon, MD as Consulting Physician (Gynecology) Isabel Caprice, MD as Consulting Physician (Gynecologic Oncology) Everitt Amber, MD as Consulting Physician (Gynecologic Oncology) Heath Lark, MD as Consulting Physician (Hematology and Oncology)  ASSESSMENT & PLAN:  Endometrial cancer [facility]) We reviewed the NCCN guidelines We discussed the role of chemotherapy. The intent is of curative intent.  We discussed some of the risks, benefits, side-effects of carboplatin & Taxol. Treatment is intravenous, every 3 weeks x 6 cycles  Some of the short term side-effects included, though not limited to, including weight loss, life threatening infections, risk of allergic reactions, need for transfusions of blood products, nausea, vomiting, change in bowel habits, loss of hair, admission to hospital for various reasons, and risks of death.   Long term side-effects are also discussed including risks of infertility, permanent damage to nerve function, hearing loss, chronic fatigue, kidney damage with possibility needing hemodialysis, and rare secondary malignancy including bone marrow disorders.  The patient is aware that the response rates discussed earlier is not guaranteed.  After a long discussion, patient made an informed decision to proceed with the prescribed plan of care.   Patient education material was dispensed. We discussed premedication with dexamethasone before chemotherapy. Due to her large body mass index, I will put maximum dose of carboplatin at 750 mg and reduce the dose of Taxol upfront.  Controlled insulin dependent diabetes mellitus with retinopathy ([place]) She is  at high risk of diabetic neuropathy and severe hyperglycemia I plan to reduce oral dexamethasone before treatment We discussed the importance of dietary modification while on treatment  Dysuria Her UTI symptoms have resolved.  We will proceed with treatment without delay   No orders of the defined types were placed in this encounter.   INTERVAL HISTORY: Please see below for problem oriented charting. She returns with her daughter for further follow-up, for chemotherapy consent Her urinary symptoms has improved Denies recent infection, fever or chills Appetite is stable. SUMMARY OF ONCOLOGIC HISTORY: Oncology History   Endometrioid MSI     Endometrial cancer ([place])   01/24/2018 Initial Diagnosis    Patient was seen by GYN for PMB since July 2019.  Pt has hx of complex endometrial hyperplasia without atypica that was treated with Mirena IUD for 5 years.  This was removed about two years ago.  She did have follow up biopsies after placement and did have resolution of hyperplasia.    Patient's last menstrual period was 08/10/2010.    01/25/2018 Pathology Results    Endometrium, biopsy - ENDOMETRIOID ADENOCARCINOMA, FIGO GRADE 1. - SEE COMMENT.    01/25/2018 Procedure    She had endometrial biopsy in GYN office    01/25/2018 Imaging    US pelvis  UTERUS: 8.5 x 5.3 x 4.5cm EMS: 18-70m, areas of vascular flow noted as well ADNEXA: Left ovary: not seen transvaginally or transabdominally due to habitus                  Right ovary: same as with left ovary CUL DE SAC: no free fluid     02/19/2018 Pathology Results    Uterus +/- tubes/ovaries, neoplastic, cervix - UTERUS: -ENDO/MYOMETRIUM: INVASIVE ENDOMETRIOID ADENOCARCINOMA WITH FOCAL SQUAMOUS DIFFERENTIATION (FIGO GRADE 1), SPANNING  5.8 CM. TUMOR INVADES LESS THAN ONE HALF OF THE MYOMETRIUM. TUMOR INVOLVES CERVICAL STROMA AND BILATERAL FALLOPIAN TUBE LUMEN. SEE ONCOLOGY TABLE. -SEROSA: UNREMARKABLE. NO MALIGNANCY. -  CERVIX: STROMAL INVOLVEMENT BY ENDOMETRIOID ADENOCARCINOMA. - BILATERAL OVARIES: INCLUSION CYSTS. NO MALIGNANCY. - BILATERAL FALLOPIAN TUBES: LUMEN INVOLVEMENT BY ENDOMETRIOID ADENOCARCINOMA. Microscopic Comment UTERUS, CARCINOMA OR CARCINOSARCOMA Procedure: Total hysterectomy with bilateral salpingo-oophorectomy. Histologic type: Endometrioid adenocarcinoma with squamous differentiation. Histologic Grade: FIGO grade 1. Myometrial invasion: Depth of invasion: 3 mm Myometrial thickness: 17 mm Uterine Serosa Involvement: Not identified. Cervical stromal involvement: Present. Extent of involvement of other organs: Involves cervical stroma and lumen of bilateral fallopian tubes. Lymphovascular invasion: Not identified. Regional Lymph Nodes: None examined. Tumor block for ancillary studies: 1D-G MMR / MSI testing: Will be ordered.    02/19/2018 Surgery    Surgeon: Donaciano Eva   Operation: Robotic-assisted laparoscopic total hysterectomy with bilateral salpingoophorectomy (22 modifier for extreme obesity - see details in body of operative note)   Operative Findings:  : extreme intraperitoneal adiposity which prevented retroperitoneal visualization of the lymph node basins and increased the duration of the procedure by 1 hour (for additional instrumentation, placement of retraction sutures, additional personnel required for assistance.  8cm uterus, grossly normal ovaries, surgically interrupted tubes    03/05/2018 Imaging    1. Upper normal pelvic sidewall lymph nodes bilaterally. Close attention on follow-up recommended. 2. No other findings to suggest metastatic disease in the chest, abdomen, or pelvis. 3.  Aortic Atherosclerois (ICD10-170.0)     03/12/2018 Cancer Staging    Staging form: Corpus Uteri - Carcinoma and Carcinosarcoma, AJCC 8th Edition - Pathologic: Stage III (pT3, pN0, cM0) - Signed by Heath Lark, MD on 03/12/2018    03/16/2018 Procedure    Ultrasound and  fluoroscopically guided right internal jugular single lumen power port catheter insertion. Tip in the SVC/RA junction. Catheter ready for use.     REVIEW OF SYSTEMS:   Constitutional: Denies fevers, chills or abnormal weight loss Eyes: Denies blurriness of vision Ears, nose, mouth, throat, and face: Denies mucositis or sore throat Respiratory: Denies cough, dyspnea or wheezes Cardiovascular: Denies palpitation, chest discomfort or lower extremity swelling Gastrointestinal:  Denies nausea, heartburn or change in bowel habits Skin: Denies abnormal skin rashes Lymphatics: Denies new lymphadenopathy or easy bruising Neurological:Denies numbness, tingling or new weaknesses Behavioral/Psych: Mood is stable, no new changes  All other systems were reviewed with the patient and are negative.  I have reviewed the past medical history, past surgical history, social history and family history with the patient and they are unchanged from previous note.  ALLERGIES:  is allergic to adhesive [tape]; banana; eggs or egg-derived products; latex; penicillins; pine; and rose.  MEDICATIONS:  Current Outpatient Medications  Medication Sig Dispense Refill  . albuterol (PROVENTIL HFA;VENTOLIN HFA) 108 (90 Base) MCG/ACT inhaler Inhale 2 puffs into lungs every 6 hours as needed for wheezing or shortness of breath Dx: J45.50 18 Inhaler 3  . albuterol (PROVENTIL) (2.5 MG/3ML) 0.083% nebulizer solution Take 3 mLs (2.5 mg total) by nebulization every 4 (four) hours as needed for wheezing or shortness of breath (Dx: J45.50). 75 mL 1  . clobetasol ointment (TEMOVATE) 0.05 % APPLY TO AFFECTED AREA TWICE A DAY (Patient taking differently: Apply 1 application topically 2 (two) times daily as needed (for irritation). ) 60 g 1  . dexamethasone (DECADRON) 4 MG tablet Take 2 tabs at the night before and 2 tab the morning of chemotherapy, every 3 weeks, by mouth 24 tablet  0  . enalapril (VASOTEC) 10 MG tablet TAKE 1 TABLET BY  MOUTH EVERY DAY (Patient taking differently: Take 10 mg by mouth daily. ) 90 tablet 1  . fluticasone (CUTIVATE) 0.05 % cream APPLY TO AFFECTED AREA OF R LOWER LEG TWICE PER DAY 30 g 0  . fluticasone (FLONASE SENSIMIST) 27.5 MCG/SPRAY nasal spray Place 1 spray into the nose daily as needed for rhinitis.    . furosemide (LASIX) 20 MG tablet TAKE 1 TABLET BY MOUTH EVERY DAY (Patient taking differently: Take 20 mg by mouth daily. ) 90 tablet 1  . HYDROcodone-acetaminophen (NORCO/VICODIN) 5-325 MG tablet 1-2 tabs po qd prn pain 60 tablet 0  . insulin aspart (NOVOLOG) 100 UNIT/ML injection USE PER SLIDING SCALE AT EACH MEAL (AVG USE IS 17 UNITS THREE TIMES DAILY) 50 mL 2  . insulin NPH Human (NOVOLIN N) 100 UNIT/ML injection 65 U SQ qAM, 65 U SQ mid day, and 61 U SQ q evening (Patient taking differently: Inject 60 Units into the skin 3 (three) times daily. ) 50 mL 5  . Insulin Syringes, Disposable, U-100 0.3 ML MISC Use to inject insulin--6 injections per day 540 each 3  . levocetirizine (XYZAL) 5 MG tablet Take 5 mg by mouth at bedtime as needed for allergies.   5  . levothyroxine (SYNTHROID, LEVOTHROID) 125 MCG tablet TAKE 1 TABLET BY MOUTH EVERY MORNING ON AN EMPTY STOMACH 90 tablet 0  . lidocaine-prilocaine (EMLA) cream Apply to affected area once 30 g 3  . meloxicam (MOBIC) 15 MG tablet TAKE 1 TABLET BY MOUTH EVERY DAY AS NEEDED (Patient taking differently: Take 15 mg by mouth daily as needed for pain. ) 30 tablet 3  . metFORMIN (GLUCOPHAGE) 500 MG tablet TAKE 1 TABLET BY MOUTH EVERY MORNING AND 2 TABLETS BY MOUTH AT BEDTIME (Patient taking differently: Take 500-1,000 mg by mouth See admin instructions. Take 500 mg by mouth in the morning and take 1000 mg by mouth at supper) 270 tablet 1  . miconazole (MICRO GUARD) 2 % powder Apply 1 application topically 2 (two) times daily as needed for itching.    . montelukast (SINGULAIR) 10 MG tablet Take 10 mg by mouth every evening.     . nitrofurantoin,  macrocrystal-monohydrate, (MACROBID) 100 MG capsule Take 1 capsule (100 mg total) by mouth 2 (two) times daily. 10 capsule 0  . ondansetron (ZOFRAN) 8 MG tablet Take 1 tablet (8 mg total) by mouth every 8 (eight) hours as needed for refractory nausea / vomiting. Start on day 3 after chemo. 30 tablet 1  . pravastatin (PRAVACHOL) 20 MG tablet TAKE 1 TABLET BY MOUTH EVERY DAY (Patient taking differently: Take 20 mg by mouth daily. ) 90 tablet 2  . Probiotic Product (PROBIOTIC PO) Take 1 capsule by mouth daily.    . prochlorperazine (COMPAZINE) 10 MG tablet Take 1 tablet (10 mg total) by mouth every 6 (six) hours as needed (Nausea or vomiting). 30 tablet 1  . senna-docusate (SENOKOT-S) 8.6-50 MG tablet Take 2 tablets by mouth at bedtime. Hold if having loose stools 30 tablet 1  . SYMBICORT 160-4.5 MCG/ACT inhaler Inhale 2 puffs into the lungs 2 (two) times daily.    . VENTOLIN HFA 108 (90 Base) MCG/ACT inhaler INHALE 2 PUFFS INTO LUNGS EVERY 6 HOURS AS NEEDED FOR WHEEZING/SHORTNESS OF BREATH 18 Inhaler 0   No current facility-administered medications for this visit.     PHYSICAL EXAMINATION: ECOG PERFORMANCE STATUS: 1 - Symptomatic but completely ambulatory  Vitals:  03/19/18 0939  BP: (!) 162/48  Pulse: 82  Resp: 18  Temp: 97.6 F (36.4 C)  SpO2: 97%   Filed Weights   03/19/18 0939  Weight: 284 lb 12.8 oz (129.2 kg)    GENERAL:alert, no distress and comfortable SKIN: skin color, texture, turgor are normal, no rashes or significant lesions EYES: normal, Conjunctiva are pink and non-injected, sclera clear OROPHARYNX:no exudate, no erythema and lips, buccal mucosa, and tongue normal  NECK: supple, thyroid normal size, non-tender, without nodularity LYMPH:  no palpable lymphadenopathy in the cervical, axillary or inguinal LUNGS: clear to auscultation and percussion with normal breathing effort HEART: regular rate & rhythm and no murmurs and no lower extremity edema ABDOMEN:abdomen  soft, non-tender and normal bowel sounds Musculoskeletal:no cyanosis of digits and no clubbing  NEURO: alert & oriented x 3 with fluent speech, no focal motor/sensory deficits  LABORATORY DATA:  I have reviewed the data as listed    Component Value Date/Time   NA 139 03/14/2018 0828   K 4.4 03/14/2018 0828   CL 105 03/14/2018 0828   CO2 23 03/14/2018 0828   GLUCOSE 171 (H) 03/14/2018 0828   BUN 22 03/14/2018 0828   CREATININE 1.03 (H) 03/14/2018 0828   CALCIUM 9.0 03/14/2018 0828   PROT 7.1 03/14/2018 0828   ALBUMIN 3.3 (L) 03/14/2018 0828   AST 26 03/14/2018 0828   ALT 23 03/14/2018 0828   ALKPHOS 65 03/14/2018 0828   BILITOT 0.3 03/14/2018 0828   GFRNONAA 56 (L) 03/14/2018 0828   GFRAA >60 03/14/2018 0828    No results found for: SPEP, UPEP  Lab Results  Component Value Date   WBC 7.6 03/14/2018   NEUTROABS 4.2 03/14/2018   HGB 10.8 (L) 03/14/2018   HCT 34.3 (L) 03/14/2018   MCV 95.5 03/14/2018   PLT 229 03/14/2018      Chemistry      Component Value Date/Time   NA 139 03/14/2018 0828   K 4.4 03/14/2018 0828   CL 105 03/14/2018 0828   CO2 23 03/14/2018 0828   BUN 22 03/14/2018 0828   CREATININE 1.03 (H) 03/14/2018 0828      Component Value Date/Time   CALCIUM 9.0 03/14/2018 0828   ALKPHOS 65 03/14/2018 0828   AST 26 03/14/2018 0828   ALT 23 03/14/2018 0828   BILITOT 0.3 03/14/2018 0828       RADIOGRAPHIC STUDIES: I have personally reviewed the radiological images as listed and agreed with the findings in the report. Ct Chest W Contrast  Result Date: 03/05/2018 CLINICAL DATA:  Endometrial cancer. Status post hysterectomy 02/19/2018 EXAM: CT CHEST, ABDOMEN, AND PELVIS WITH CONTRAST TECHNIQUE: Multidetector CT imaging of the chest, abdomen and pelvis was performed following the standard protocol during bolus administration of intravenous contrast. CONTRAST:  198m OMNIPAQUE IOHEXOL 300 MG/ML  SOLN COMPARISON:  Chest CT 10/04/2016. FINDINGS: CT CHEST  FINDINGS Cardiovascular: Heart size upper normal to mildly increased. No substantial pericardial effusion. Atherosclerotic calcification is noted in the wall of the thoracic aorta. Mediastinum/Nodes: No mediastinal lymphadenopathy. There is no hilar lymphadenopathy. The esophagus has normal imaging features. There is no axillary lymphadenopathy. Lungs/Pleura: The central tracheobronchial airways are patent. No suspicious pulmonary nodule or mass. No pleural effusion. Musculoskeletal: No worrisome lytic or sclerotic osseous abnormality. CT ABDOMEN PELVIS FINDINGS Hepatobiliary: No focal abnormality within the liver parenchyma. Gallbladder surgically absent. No intrahepatic or extrahepatic biliary dilation. Pancreas: Diffuse atrophy of pancreatic parenchyma without main ductal dilatation. Spleen: No splenomegaly. No focal mass lesion. Adrenals/Urinary  Tract: No adrenal nodule or mass. Kidneys unremarkable. No evidence for hydroureter. The urinary bladder appears normal for the degree of distention. Stomach/Bowel: Stomach is nondistended. No gastric wall thickening. No evidence of outlet obstruction. Duodenum is normally positioned as is the ligament of Treitz. No small bowel wall thickening. No small bowel dilatation. The terminal ileum is normal. No gross colonic mass. No colonic wall thickening. Vascular/Lymphatic: There is abdominal aortic atherosclerosis without aneurysm. There is no gastrohepatic or hepatoduodenal ligament lymphadenopathy. No intraperitoneal or retroperitoneal lymphadenopathy. Small para-aortic lymph nodes are evident. Borderline lymph nodes are seen in the left external iliac chain measuring 9 mm short axis (98/2) and 8 mm short axis (99/2). Similar small lymph nodes are seen on the right side. Reproductive: The uterus surgically absent. There is no adnexal mass. Other: No intraperitoneal free fluid. Musculoskeletal: Marked laxity of the anterior abdominal wall fascia evident. Degenerative  changes noted lumbar spine. IMPRESSION: 1. Upper normal pelvic sidewall lymph nodes bilaterally. Close attention on follow-up recommended. 2. No other findings to suggest metastatic disease in the chest, abdomen, or pelvis. 3.  Aortic Atherosclerois (ICD10-170.0) Electronically Signed   By: Misty Stanley M.D.   On: 03/05/2018 14:24   Ct Abdomen Pelvis W Contrast  Result Date: 03/05/2018 CLINICAL DATA:  Endometrial cancer. Status post hysterectomy 02/19/2018 EXAM: CT CHEST, ABDOMEN, AND PELVIS WITH CONTRAST TECHNIQUE: Multidetector CT imaging of the chest, abdomen and pelvis was performed following the standard protocol during bolus administration of intravenous contrast. CONTRAST:  139m OMNIPAQUE IOHEXOL 300 MG/ML  SOLN COMPARISON:  Chest CT 10/04/2016. FINDINGS: CT CHEST FINDINGS Cardiovascular: Heart size upper normal to mildly increased. No substantial pericardial effusion. Atherosclerotic calcification is noted in the wall of the thoracic aorta. Mediastinum/Nodes: No mediastinal lymphadenopathy. There is no hilar lymphadenopathy. The esophagus has normal imaging features. There is no axillary lymphadenopathy. Lungs/Pleura: The central tracheobronchial airways are patent. No suspicious pulmonary nodule or mass. No pleural effusion. Musculoskeletal: No worrisome lytic or sclerotic osseous abnormality. CT ABDOMEN PELVIS FINDINGS Hepatobiliary: No focal abnormality within the liver parenchyma. Gallbladder surgically absent. No intrahepatic or extrahepatic biliary dilation. Pancreas: Diffuse atrophy of pancreatic parenchyma without main ductal dilatation. Spleen: No splenomegaly. No focal mass lesion. Adrenals/Urinary Tract: No adrenal nodule or mass. Kidneys unremarkable. No evidence for hydroureter. The urinary bladder appears normal for the degree of distention. Stomach/Bowel: Stomach is nondistended. No gastric wall thickening. No evidence of outlet obstruction. Duodenum is normally positioned as is the  ligament of Treitz. No small bowel wall thickening. No small bowel dilatation. The terminal ileum is normal. No gross colonic mass. No colonic wall thickening. Vascular/Lymphatic: There is abdominal aortic atherosclerosis without aneurysm. There is no gastrohepatic or hepatoduodenal ligament lymphadenopathy. No intraperitoneal or retroperitoneal lymphadenopathy. Small para-aortic lymph nodes are evident. Borderline lymph nodes are seen in the left external iliac chain measuring 9 mm short axis (98/2) and 8 mm short axis (99/2). Similar small lymph nodes are seen on the right side. Reproductive: The uterus surgically absent. There is no adnexal mass. Other: No intraperitoneal free fluid. Musculoskeletal: Marked laxity of the anterior abdominal wall fascia evident. Degenerative changes noted lumbar spine. IMPRESSION: 1. Upper normal pelvic sidewall lymph nodes bilaterally. Close attention on follow-up recommended. 2. No other findings to suggest metastatic disease in the chest, abdomen, or pelvis. 3.  Aortic Atherosclerois (ICD10-170.0) Electronically Signed   By: EMisty StanleyM.D.   On: 03/05/2018 14:24   Ir Imaging Guided Port Insertion  Result Date: 03/16/2018 CLINICAL DATA:  Endometrial carcinoma,  access for chemotherapy EXAM: RIGHT INTERNAL JUGULAR SINGLE LUMEN POWER PORT CATHETER INSERTION Date:  03/16/2018 03/16/2018 2:34 pm Radiologist:  M. Daryll Brod, MD Guidance:  Ultrasound and fluoroscopic MEDICATIONS: 1.5 g vancomycin; The antibiotic was administered within an appropriate time interval prior to skin puncture. ANESTHESIA/SEDATION: Versed 4 mg IV; Fentanyl 100 mcg IV; Moderate Sedation Time:  28 minutes The patient was continuously monitored during the procedure by the interventional radiology nurse under my direct supervision. FLUOROSCOPY TIME:  One minutes, 0 seconds (27 mGy) COMPLICATIONS: None immediate. CONTRAST:  None. PROCEDURE: Informed consent was obtained from the patient following  explanation of the procedure, risks, benefits and alternatives. The patient understands, agrees and consents for the procedure. All questions were addressed. A time out was performed. Maximal barrier sterile technique utilized including caps, mask, sterile gowns, sterile gloves, large sterile drape, hand hygiene, and 2% chlorhexidine scrub. Under sterile conditions and local anesthesia, right internal jugular micropuncture venous access was performed. Access was performed with ultrasound. Images were obtained for documentation of the patent right internal jugular vein. A guide wire was inserted followed by a transitional dilator. This allowed insertion of a guide wire and catheter into the IVC. Measurements were obtained from the SVC / RA junction back to the right IJ venotomy site. In the right infraclavicular chest, a subcutaneous pocket was created over the second anterior rib. This was done under sterile conditions and local anesthesia. 1% lidocaine with epinephrine was utilized for this. A 2.5 cm incision was made in the skin. Blunt dissection was performed to create a subcutaneous pocket over the right pectoralis major muscle. The pocket was flushed with saline vigorously. There was adequate hemostasis. The port catheter was assembled and checked for leakage. The port catheter was secured in the pocket with two retention sutures. The tubing was tunneled subcutaneously to the right venotomy site and inserted into the SVC/RA junction through a valved peel-away sheath. Position was confirmed with fluoroscopy. Images were obtained for documentation. The patient tolerated the procedure well. No immediate complications. Incisions were closed in a two layer fashion with 4 - 0 Vicryl suture. Dermabond was applied to the skin. The port catheter was accessed, blood was aspirated followed by saline and heparin flushes. Needle was removed. A dry sterile dressing was applied. IMPRESSION: Ultrasound and fluoroscopically  guided right internal jugular single lumen power port catheter insertion. Tip in the SVC/RA junction. Catheter ready for use. Electronically Signed   By: Jerilynn Mages.  Shick M.D.   On: 03/16/2018 14:40    All questions were answered. The patient knows to call the clinic with any problems, questions or concerns. No barriers to learning was detected.  I spent 25 minutes counseling the patient face to face. The total time spent in the appointment was 30 minutes and more than 50% was on counseling and review of test results  Heath Lark, MD 03/19/2018 12:29 PM

## 2018-03-19 NOTE — Assessment & Plan Note (Signed)
Her UTI symptoms have resolved.  We will proceed with treatment without delay

## 2018-03-19 NOTE — Assessment & Plan Note (Signed)
She is at high risk of diabetic neuropathy and severe hyperglycemia I plan to reduce oral dexamethasone before treatment We discussed the importance of dietary modification while on treatment

## 2018-03-20 ENCOUNTER — Encounter: Payer: Self-pay | Admitting: Hematology and Oncology

## 2018-03-20 ENCOUNTER — Telehealth: Payer: Self-pay | Admitting: Hematology and Oncology

## 2018-03-20 ENCOUNTER — Inpatient Hospital Stay: Payer: Medicare Other

## 2018-03-20 VITALS — BP 127/47 | HR 70 | Temp 98.8°F | Resp 18

## 2018-03-20 DIAGNOSIS — R300 Dysuria: Secondary | ICD-10-CM | POA: Diagnosis not present

## 2018-03-20 DIAGNOSIS — Z90722 Acquired absence of ovaries, bilateral: Secondary | ICD-10-CM | POA: Diagnosis not present

## 2018-03-20 DIAGNOSIS — C541 Malignant neoplasm of endometrium: Secondary | ICD-10-CM

## 2018-03-20 DIAGNOSIS — Z90710 Acquired absence of both cervix and uterus: Secondary | ICD-10-CM | POA: Diagnosis not present

## 2018-03-20 MED ORDER — SODIUM CHLORIDE 0.9% FLUSH
10.0000 mL | INTRAVENOUS | Status: DC | PRN
  Administered 2018-03-20: 10 mL
  Filled 2018-03-20: qty 10

## 2018-03-20 MED ORDER — DIPHENHYDRAMINE HCL 50 MG/ML IJ SOLN
INTRAMUSCULAR | Status: AC
  Filled 2018-03-20: qty 1

## 2018-03-20 MED ORDER — SODIUM CHLORIDE 0.9 % IV SOLN
140.0000 mg/m2 | Freq: Once | INTRAVENOUS | Status: AC
  Administered 2018-03-20: 330 mg via INTRAVENOUS
  Filled 2018-03-20: qty 55

## 2018-03-20 MED ORDER — DIPHENHYDRAMINE HCL 50 MG/ML IJ SOLN
50.0000 mg | Freq: Once | INTRAMUSCULAR | Status: AC
  Administered 2018-03-20: 50 mg via INTRAVENOUS

## 2018-03-20 MED ORDER — FAMOTIDINE IN NACL 20-0.9 MG/50ML-% IV SOLN
INTRAVENOUS | Status: AC
  Filled 2018-03-20: qty 50

## 2018-03-20 MED ORDER — FAMOTIDINE IN NACL 20-0.9 MG/50ML-% IV SOLN
20.0000 mg | Freq: Once | INTRAVENOUS | Status: AC
  Administered 2018-03-20: 20 mg via INTRAVENOUS

## 2018-03-20 MED ORDER — HEPARIN SOD (PORK) LOCK FLUSH 100 UNIT/ML IV SOLN
500.0000 [IU] | Freq: Once | INTRAVENOUS | Status: AC | PRN
  Administered 2018-03-20: 500 [IU]
  Filled 2018-03-20: qty 5

## 2018-03-20 MED ORDER — PALONOSETRON HCL INJECTION 0.25 MG/5ML
INTRAVENOUS | Status: AC
  Filled 2018-03-20: qty 5

## 2018-03-20 MED ORDER — SODIUM CHLORIDE 0.9 % IV SOLN
Freq: Once | INTRAVENOUS | Status: AC
  Administered 2018-03-20: 08:00:00 via INTRAVENOUS
  Filled 2018-03-20: qty 250

## 2018-03-20 MED ORDER — SODIUM CHLORIDE 0.9 % IV SOLN
750.0000 mg | Freq: Once | INTRAVENOUS | Status: AC
  Administered 2018-03-20: 750 mg via INTRAVENOUS
  Filled 2018-03-20: qty 75

## 2018-03-20 MED ORDER — SODIUM CHLORIDE 0.9 % IV SOLN
Freq: Once | INTRAVENOUS | Status: AC
  Administered 2018-03-20: 09:00:00 via INTRAVENOUS
  Filled 2018-03-20: qty 5

## 2018-03-20 MED ORDER — PALONOSETRON HCL INJECTION 0.25 MG/5ML
0.2500 mg | Freq: Once | INTRAVENOUS | Status: AC
  Administered 2018-03-20: 0.25 mg via INTRAVENOUS

## 2018-03-20 NOTE — Patient Instructions (Signed)
[facility] Discharge Instructions for Patients Receiving Chemotherapy  Today you received the following chemotherapy agents Paclitaxel and Carboplatin  To help prevent nausea and vomiting after your treatment, we encourage you to take your nausea medication as prescribed.   If you develop nausea and vomiting that is not controlled by your nausea medication, call the clinic.   BELOW ARE SYMPTOMS THAT SHOULD BE REPORTED IMMEDIATELY:  *FEVER GREATER THAN 100.5 F  *CHILLS WITH OR WITHOUT FEVER  NAUSEA AND VOMITING THAT IS NOT CONTROLLED WITH YOUR NAUSEA MEDICATION  *UNUSUAL SHORTNESS OF BREATH  *UNUSUAL BRUISING OR BLEEDING  TENDERNESS IN MOUTH AND THROAT WITH OR WITHOUT PRESENCE OF ULCERS  *URINARY PROBLEMS  *BOWEL PROBLEMS  UNUSUAL RASH Items with * indicate a potential emergency and should be followed up as soon as possible.  Feel free to call the clinic should you have any questions or concerns. The clinic phone number is [phone].  Please show the [facility] at check-in to the Emergency Department and triage nurse.  Paclitaxel injection What is this medicine? PACLITAXEL (PAK li TAX el) is a chemotherapy drug. It targets fast dividing cells, like cancer cells, and causes these cells to die. This medicine is used to treat ovarian cancer, breast cancer, and other cancers. This medicine may be used for other purposes; ask your health care provider or pharmacist if you have questions. COMMON BRAND NAME(S): Onxol, Taxol What should I tell my health care provider before I take this medicine? They need to know if you have any of these conditions: -blood disorders -irregular heartbeat -infection (especially a virus infection such as chickenpox, cold sores, or herpes) -liver disease -previous or ongoing radiation therapy -an unusual or allergic reaction to paclitaxel, alcohol, polyoxyethylated castor oil, other chemotherapy agents, other medicines,  foods, dyes, or preservatives -pregnant or trying to get pregnant -breast-feeding How should I use this medicine? This drug is given as an infusion into a vein. It is administered in a hospital or clinic by a specially trained health care professional. Talk to your pediatrician regarding the use of this medicine in children. Special care may be needed. Overdosage: If you think you have taken too much of this medicine contact a poison control center or emergency room at once. NOTE: This medicine is only for you. Do not share this medicine with others. What if I miss a dose? It is important not to miss your dose. Call your doctor or health care professional if you are unable to keep an appointment. What may interact with this medicine? Do not take this medicine with any of the following medications: -disulfiram -metronidazole This medicine may also interact with the following medications: -cyclosporine -diazepam -ketoconazole -medicines to increase blood counts like filgrastim, pegfilgrastim, sargramostim -other chemotherapy drugs like cisplatin, doxorubicin, epirubicin, etoposide, teniposide, vincristine -quinidine -testosterone -vaccines -verapamil Talk to your doctor or health care professional before taking any of these medicines: -acetaminophen -aspirin -ibuprofen -ketoprofen -naproxen This list may not describe all possible interactions. Give your health care provider a list of all the medicines, herbs, non-prescription drugs, or dietary supplements you use. Also tell them if you smoke, drink alcohol, or use illegal drugs. Some items may interact with your medicine. What should I watch for while using this medicine? Your condition will be monitored carefully while you are receiving this medicine. You will need important blood work done while you are taking this medicine. This medicine can cause serious allergic reactions. To reduce your risk you will need  to take other  medicine(s) before treatment with this medicine. If you experience allergic reactions like skin rash, itching or hives, swelling of the face, lips, or tongue, tell your doctor or health care professional right away. In some cases, you may be given additional medicines to help with side effects. Follow all directions for their use. This drug may make you feel generally unwell. This is not uncommon, as chemotherapy can affect healthy cells as well as cancer cells. Report any side effects. Continue your course of treatment even though you feel ill unless your doctor tells you to stop. Call your doctor or health care professional for advice if you get a fever, chills or sore throat, or other symptoms of a cold or flu. Do not treat yourself. This drug decreases your body's ability to fight infections. Try to avoid being around people who are sick. This medicine may increase your risk to bruise or bleed. Call your doctor or health care professional if you notice any unusual bleeding. Be careful brushing and flossing your teeth or using a toothpick because you may get an infection or bleed more easily. If you have any dental work done, tell your dentist you are receiving this medicine. Avoid taking products that contain aspirin, acetaminophen, ibuprofen, naproxen, or ketoprofen unless instructed by your doctor. These medicines may hide a fever. Do not become pregnant while taking this medicine. Women should inform their doctor if they wish to become pregnant or think they might be pregnant. There is a potential for serious side effects to an unborn child. Talk to your health care professional or pharmacist for more information. Do not breast-feed an infant while taking this medicine. Men are advised not to father a child while receiving this medicine. This product may contain alcohol. Ask your pharmacist or healthcare provider if this medicine contains alcohol. Be sure to tell all healthcare providers you are  taking this medicine. Certain medicines, like metronidazole and disulfiram, can cause an unpleasant reaction when taken with alcohol. The reaction includes flushing, headache, nausea, vomiting, sweating, and increased thirst. The reaction can last from 30 minutes to several hours. What side effects may I notice from receiving this medicine? Side effects that you should report to your doctor or health care professional as soon as possible: -allergic reactions like skin rash, itching or hives, swelling of the face, lips, or tongue -low blood counts - This drug may decrease the number of white blood cells, red blood cells and platelets. You may be at increased risk for infections and bleeding. -signs of infection - fever or chills, cough, sore throat, pain or difficulty passing urine -signs of decreased platelets or bleeding - bruising, pinpoint red spots on the skin, black, tarry stools, nosebleeds -signs of decreased red blood cells - unusually weak or tired, fainting spells, lightheadedness -breathing problems -chest pain -high or low blood pressure -mouth sores -nausea and vomiting -pain, swelling, redness or irritation at the injection site -pain, tingling, numbness in the hands or feet -slow or irregular heartbeat -swelling of the ankle, feet, hands Side effects that usually do not require medical attention (report to your doctor or health care professional if they continue or are bothersome): -bone pain -complete hair loss including hair on your head, underarms, pubic hair, eyebrows, and eyelashes -changes in the color of fingernails -diarrhea -loosening of the fingernails -loss of appetite -muscle or joint pain -red flush to skin -sweating This list may not describe all possible side effects. Call your doctor for medical advice  about side effects. You may report side effects to FDA at 1-800-FDA-1088. Where should I keep my medicine? This drug is given in a hospital or clinic and  will not be stored at home. NOTE: This sheet is a summary. It may not cover all possible information. If you have questions about this medicine, talk to your doctor, pharmacist, or health care provider.  2018 Elsevier/Gold Standard (2015-01-27 19:58:00)  Carboplatin injection What is this medicine? CARBOPLATIN (KAR boe pla tin) is a chemotherapy drug. It targets fast dividing cells, like cancer cells, and causes these cells to die. This medicine is used to treat ovarian cancer and many other cancers. This medicine may be used for other purposes; ask your health care provider or pharmacist if you have questions. COMMON BRAND NAME(S): Paraplatin What should I tell my health care provider before I take this medicine? They need to know if you have any of these conditions: -blood disorders -hearing problems -kidney disease -recent or ongoing radiation therapy -an unusual or allergic reaction to carboplatin, cisplatin, other chemotherapy, other medicines, foods, dyes, or preservatives -pregnant or trying to get pregnant -breast-feeding How should I use this medicine? This drug is usually given as an infusion into a vein. It is administered in a hospital or clinic by a specially trained health care professional. Talk to your pediatrician regarding the use of this medicine in children. Special care may be needed. Overdosage: If you think you have taken too much of this medicine contact a poison control center or emergency room at once. NOTE: This medicine is only for you. Do not share this medicine with others. What if I miss a dose? It is important not to miss a dose. Call your doctor or health care professional if you are unable to keep an appointment. What may interact with this medicine? -medicines for seizures -medicines to increase blood counts like filgrastim, pegfilgrastim, sargramostim -some antibiotics like amikacin, gentamicin, neomycin, streptomycin, tobramycin -vaccines Talk to  your doctor or health care professional before taking any of these medicines: -acetaminophen -aspirin -ibuprofen -ketoprofen -naproxen This list may not describe all possible interactions. Give your health care provider a list of all the medicines, herbs, non-prescription drugs, or dietary supplements you use. Also tell them if you smoke, drink alcohol, or use illegal drugs. Some items may interact with your medicine. What should I watch for while using this medicine? Your condition will be monitored carefully while you are receiving this medicine. You will need important blood work done while you are taking this medicine. This drug may make you feel generally unwell. This is not uncommon, as chemotherapy can affect healthy cells as well as cancer cells. Report any side effects. Continue your course of treatment even though you feel ill unless your doctor tells you to stop. In some cases, you may be given additional medicines to help with side effects. Follow all directions for their use. Call your doctor or health care professional for advice if you get a fever, chills or sore throat, or other symptoms of a cold or flu. Do not treat yourself. This drug decreases your body's ability to fight infections. Try to avoid being around people who are sick. This medicine may increase your risk to bruise or bleed. Call your doctor or health care professional if you notice any unusual bleeding. Be careful brushing and flossing your teeth or using a toothpick because you may get an infection or bleed more easily. If you have any dental work done, tell your dentist   you are receiving this medicine. Avoid taking products that contain aspirin, acetaminophen, ibuprofen, naproxen, or ketoprofen unless instructed by your doctor. These medicines may hide a fever. Do not become pregnant while taking this medicine. Women should inform their doctor if they wish to become pregnant or think they might be pregnant. There is a  potential for serious side effects to an unborn child. Talk to your health care professional or pharmacist for more information. Do not breast-feed an infant while taking this medicine. What side effects may I notice from receiving this medicine? Side effects that you should report to your doctor or health care professional as soon as possible: -allergic reactions like skin rash, itching or hives, swelling of the face, lips, or tongue -signs of infection - fever or chills, cough, sore throat, pain or difficulty passing urine -signs of decreased platelets or bleeding - bruising, pinpoint red spots on the skin, black, tarry stools, nosebleeds -signs of decreased red blood cells - unusually weak or tired, fainting spells, lightheadedness -breathing problems -changes in hearing -changes in vision -chest pain -high blood pressure -low blood counts - This drug may decrease the number of white blood cells, red blood cells and platelets. You may be at increased risk for infections and bleeding. -nausea and vomiting -pain, swelling, redness or irritation at the injection site -pain, tingling, numbness in the hands or feet -problems with balance, talking, walking -trouble passing urine or change in the amount of urine Side effects that usually do not require medical attention (report to your doctor or health care professional if they continue or are bothersome): -hair loss -loss of appetite -metallic taste in the mouth or changes in taste This list may not describe all possible side effects. Call your doctor for medical advice about side effects. You may report side effects to FDA at 1-800-FDA-1088. Where should I keep my medicine? This drug is given in a hospital or clinic and will not be stored at home. NOTE: This sheet is a summary. It may not cover all possible information. If you have questions about this medicine, talk to your doctor, pharmacist, or health care provider.  2018 Elsevier/Gold  Standard (2007-07-03 14:38:05)

## 2018-03-20 NOTE — Telephone Encounter (Signed)
Per 12/9 los, return for no new orders.

## 2018-03-20 NOTE — Progress Notes (Signed)
Went to infusion to meet with patient to introduce myself as Arboriculturist and to offer available resources. 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 She states she would share with her mom when she wakes up.   Card was left for any additional financial questions or concerns.

## 2018-03-21 ENCOUNTER — Telehealth: Payer: Self-pay | Admitting: Family Medicine

## 2018-03-21 ENCOUNTER — Encounter: Payer: Self-pay | Admitting: Family Medicine

## 2018-03-21 NOTE — Telephone Encounter (Signed)
Patient states that she is done with her steroids as of yesterday.  Today at lunch her glucose was 177.  Do you still want her to increase her dose everyday or only when she has to take steroids?  Please advise.  She states she will only do Steroids the day before chemo ( 8 mg ) and then the day of but then she is given steroids IV during chemo.  Patient states she is also doing very low carb diet when she has to take the steroids.

## 2018-03-21 NOTE — Telephone Encounter (Signed)
OK: Increase NPH to 68 U qAM, 68 U qLunch, and 63 U qSupper. Continue with current sliding scale insulin at each meal. Call in 1 week with additional glucose report.-thx

## 2018-03-21 NOTE — Progress Notes (Signed)
PA for emla cream and ondansetron have been submitted.

## 2018-03-21 NOTE — Telephone Encounter (Signed)
Copied from [place] [id number]. Topic: General - Other >> Mar 21, 2018  9:33 AM Virl Axe D wrote: Reason for CRM: Pt called per Dr. Anitra Lauth to report her blood sugar levels due to new medication. Pt is following sliding scale 03/20/18 - Pre breakfast 195 (started oral steroids midnight) 03/20/18 - Pre lunch 225 (oral steroids and IV steroids) 03/20/18 - Pre dinner 202 03/20/18 - Bedtime 225 03/21/18 - Pre breakfast 255

## 2018-03-21 NOTE — Telephone Encounter (Signed)
Tell her to do the 70/30 increased dosing from the day of getting her chemo and also for 3 days after the chemo, then she can go back to her usual dosing.-thx

## 2018-03-22 NOTE — Telephone Encounter (Signed)
Patient advised of new dosing instructions, patient agrees that this will be best.  She will call us with her glucose numbers during her next chemo treatment / increased insulin dose in 3 weeks.

## 2018-03-22 NOTE — Telephone Encounter (Signed)
Patient concerned because today she is on her second day off steroids and last night her Glucose was 54 around 10pm.  She got it back up to 80 before bed and then it was 64 when she woke up today.  Please advise.

## 2018-03-22 NOTE — Telephone Encounter (Signed)
OK. Tell her to take the increased 70/30 dosing on the day of chemo and the day after and that's it.-thx

## 2018-04-09 ENCOUNTER — Encounter: Payer: Self-pay | Admitting: Hematology and Oncology

## 2018-04-09 ENCOUNTER — Inpatient Hospital Stay: Payer: Medicare Other

## 2018-04-09 ENCOUNTER — Inpatient Hospital Stay (HOSPITAL_BASED_OUTPATIENT_CLINIC_OR_DEPARTMENT_OTHER): Payer: Medicare Other | Admitting: Hematology and Oncology

## 2018-04-09 ENCOUNTER — Ambulatory Visit: Payer: Medicare Other | Admitting: Family Medicine

## 2018-04-09 DIAGNOSIS — E11319 Type 2 diabetes mellitus with unspecified diabetic retinopathy without macular edema: Secondary | ICD-10-CM | POA: Diagnosis not present

## 2018-04-09 DIAGNOSIS — C541 Malignant neoplasm of endometrium: Secondary | ICD-10-CM

## 2018-04-09 DIAGNOSIS — E875 Hyperkalemia: Secondary | ICD-10-CM | POA: Insufficient documentation

## 2018-04-09 DIAGNOSIS — Z90722 Acquired absence of ovaries, bilateral: Secondary | ICD-10-CM

## 2018-04-09 DIAGNOSIS — R600 Localized edema: Secondary | ICD-10-CM | POA: Diagnosis not present

## 2018-04-09 DIAGNOSIS — Z90710 Acquired absence of both cervix and uterus: Secondary | ICD-10-CM | POA: Diagnosis not present

## 2018-04-09 DIAGNOSIS — R300 Dysuria: Secondary | ICD-10-CM | POA: Diagnosis not present

## 2018-04-09 DIAGNOSIS — T451X5A Adverse effect of antineoplastic and immunosuppressive drugs, initial encounter: Secondary | ICD-10-CM

## 2018-04-09 DIAGNOSIS — IMO0001 Reserved for inherently not codable concepts without codable children: Secondary | ICD-10-CM

## 2018-04-09 DIAGNOSIS — Z794 Long term (current) use of insulin: Secondary | ICD-10-CM

## 2018-04-09 DIAGNOSIS — G620 Drug-induced polyneuropathy: Secondary | ICD-10-CM | POA: Diagnosis not present

## 2018-04-09 LAB — COMPREHENSIVE METABOLIC PANEL WITH GFR
ALT: 24 U/L (ref 0–44)
AST: 24 U/L (ref 15–41)
Albumin: 3.1 g/dL — ABNORMAL LOW (ref 3.5–5.0)
Alkaline Phosphatase: 70 U/L (ref 38–126)
Anion gap: 14 (ref 5–15)
BUN: 25 mg/dL — ABNORMAL HIGH (ref 8–23)
CO2: 20 mmol/L — ABNORMAL LOW (ref 22–32)
Calcium: 9.1 mg/dL (ref 8.9–10.3)
Chloride: 104 mmol/L (ref 98–111)
Creatinine, Ser: 1.1 mg/dL — ABNORMAL HIGH (ref 0.44–1.00)
GFR calc Af Amer: >60 mL/min (ref >60)
GFR calc non Af Amer: 52 mL/min — ABNORMAL LOW (ref >60)
Glucose, Bld: 292 mg/dL — ABNORMAL HIGH (ref 70–99)
Potassium: 5.4 mmol/L — ABNORMAL HIGH (ref 3.5–5.1)
Sodium: 138 mmol/L (ref 135–145)
Total Bilirubin: 0.3 mg/dL (ref 0.3–1.2)
Total Protein: 7.2 g/dL (ref 6.5–8.1)

## 2018-04-09 LAB — CBC WITH DIFFERENTIAL/PLATELET
Abs Immature Granulocytes: 0.1 10*3/uL — ABNORMAL HIGH (ref 0.00–0.07)
Basophils Absolute: 0 10*3/uL (ref 0.0–0.1)
Basophils Relative: 0 %
Eosinophils Absolute: 0 10*3/uL (ref 0.0–0.5)
Eosinophils Relative: 0 %
HCT: 30.7 % — ABNORMAL LOW (ref 36.0–46.0)
Hemoglobin: 9.7 g/dL — ABNORMAL LOW (ref 12.0–15.0)
Immature Granulocytes: 2 %
Lymphocytes Relative: 9 %
Lymphs Abs: 0.5 10*3/uL — ABNORMAL LOW (ref 0.7–4.0)
MCH: 29.8 pg (ref 26.0–34.0)
MCHC: 31.6 g/dL (ref 30.0–36.0)
MCV: 94.2 fL (ref 80.0–100.0)
MONO ABS: 0.2 10*3/uL (ref 0.1–1.0)
Monocytes Relative: 3 %
Neutro Abs: 5 10*3/uL (ref 1.7–7.7)
Neutrophils Relative %: 86 %
Platelets: 199 10*3/uL (ref 150–400)
RBC: 3.26 MIL/uL — ABNORMAL LOW (ref 3.87–5.11)
RDW: 15.7 % — ABNORMAL HIGH (ref 11.5–15.5)
WBC: 5.8 10*3/uL (ref 4.0–10.5)
nRBC: 0 % (ref 0.0–0.2)

## 2018-04-09 MED ORDER — SODIUM CHLORIDE 0.9 % IV SOLN
750.0000 mg | Freq: Once | INTRAVENOUS | Status: AC
  Administered 2018-04-09: 750 mg via INTRAVENOUS
  Filled 2018-04-09: qty 75

## 2018-04-09 MED ORDER — SODIUM CHLORIDE 0.9% FLUSH
10.0000 mL | Freq: Once | INTRAVENOUS | Status: AC
  Administered 2018-04-09: 10 mL
  Filled 2018-04-09: qty 10

## 2018-04-09 MED ORDER — SODIUM CHLORIDE 0.9 % IV SOLN
Freq: Once | INTRAVENOUS | Status: AC
  Administered 2018-04-09: 11:00:00 via INTRAVENOUS
  Filled 2018-04-09: qty 250

## 2018-04-09 MED ORDER — INSULIN REGULAR HUMAN 100 UNIT/ML IJ SOLN
10.0000 [IU] | Freq: Once | INTRAMUSCULAR | Status: AC
  Administered 2018-04-09: 10 [IU] via SUBCUTANEOUS
  Filled 2018-04-09: qty 10

## 2018-04-09 MED ORDER — FAMOTIDINE IN NACL 20-0.9 MG/50ML-% IV SOLN
20.0000 mg | Freq: Once | INTRAVENOUS | Status: AC
  Administered 2018-04-09: 20 mg via INTRAVENOUS

## 2018-04-09 MED ORDER — DIPHENHYDRAMINE HCL 50 MG/ML IJ SOLN
50.0000 mg | Freq: Once | INTRAMUSCULAR | Status: AC
  Administered 2018-04-09: 50 mg via INTRAVENOUS

## 2018-04-09 MED ORDER — DIPHENHYDRAMINE HCL 50 MG/ML IJ SOLN
INTRAMUSCULAR | Status: AC
  Filled 2018-04-09: qty 1

## 2018-04-09 MED ORDER — SODIUM CHLORIDE 0.9 % IV SOLN
140.0000 mg/m2 | Freq: Once | INTRAVENOUS | Status: AC
  Administered 2018-04-09: 330 mg via INTRAVENOUS
  Filled 2018-04-09: qty 55

## 2018-04-09 MED ORDER — FAMOTIDINE IN NACL 20-0.9 MG/50ML-% IV SOLN
INTRAVENOUS | Status: AC
  Filled 2018-04-09: qty 50

## 2018-04-09 MED ORDER — SODIUM CHLORIDE 0.9% FLUSH
10.0000 mL | INTRAVENOUS | Status: DC | PRN
  Administered 2018-04-09: 10 mL
  Filled 2018-04-09: qty 10

## 2018-04-09 MED ORDER — PALONOSETRON HCL INJECTION 0.25 MG/5ML
INTRAVENOUS | Status: AC
  Filled 2018-04-09: qty 5

## 2018-04-09 MED ORDER — PALONOSETRON HCL INJECTION 0.25 MG/5ML
0.2500 mg | Freq: Once | INTRAVENOUS | Status: AC
  Administered 2018-04-09: 0.25 mg via INTRAVENOUS

## 2018-04-09 MED ORDER — SODIUM CHLORIDE 0.9 % IV SOLN
Freq: Once | INTRAVENOUS | Status: AC
  Administered 2018-04-09: 12:00:00 via INTRAVENOUS
  Filled 2018-04-09: qty 5

## 2018-04-09 MED ORDER — HEPARIN SOD (PORK) LOCK FLUSH 100 UNIT/ML IV SOLN
500.0000 [IU] | Freq: Once | INTRAVENOUS | Status: AC | PRN
  Administered 2018-04-09: 500 [IU]
  Filled 2018-04-09: qty 5

## 2018-04-09 NOTE — Assessment & Plan Note (Signed)
she has mild peripheral neuropathy, likely related to side effects of treatment. It is only mild, not bothering the patient. I will observe for now If it gets worse in the future, I will consider modifying the dose of the treatment  

## 2018-04-09 NOTE — Patient Instructions (Signed)
[facility] Discharge Instructions for Patients Receiving Chemotherapy  Today you received the following chemotherapy agents Paclitaxel and Carboplatin  To help prevent nausea and vomiting after your treatment, we encourage you to take your nausea medication as prescribed.   If you develop nausea and vomiting that is not controlled by your nausea medication, call the clinic.   BELOW ARE SYMPTOMS THAT SHOULD BE REPORTED IMMEDIATELY:  *FEVER GREATER THAN 100.5 F  *CHILLS WITH OR WITHOUT FEVER  NAUSEA AND VOMITING THAT IS NOT CONTROLLED WITH YOUR NAUSEA MEDICATION  *UNUSUAL SHORTNESS OF BREATH  *UNUSUAL BRUISING OR BLEEDING  TENDERNESS IN MOUTH AND THROAT WITH OR WITHOUT PRESENCE OF ULCERS  *URINARY PROBLEMS  *BOWEL PROBLEMS  UNUSUAL RASH Items with * indicate a potential emergency and should be followed up as soon as possible.  Feel free to call the clinic should you have any questions or concerns. The clinic phone number is [phone].  Please show the [facility] at check-in to the Emergency Department and triage nurse.  Paclitaxel injection What is this medicine? PACLITAXEL (PAK li TAX el) is a chemotherapy drug. It targets fast dividing cells, like cancer cells, and causes these cells to die. This medicine is used to treat ovarian cancer, breast cancer, and other cancers. This medicine may be used for other purposes; ask your health care provider or pharmacist if you have questions. COMMON BRAND NAME(S): Onxol, Taxol What should I tell my health care provider before I take this medicine? They need to know if you have any of these conditions: -blood disorders -irregular heartbeat -infection (especially a virus infection such as chickenpox, cold sores, or herpes) -liver disease -previous or ongoing radiation therapy -an unusual or allergic reaction to paclitaxel, alcohol, polyoxyethylated castor oil, other chemotherapy agents, other medicines,  foods, dyes, or preservatives -pregnant or trying to get pregnant -breast-feeding How should I use this medicine? This drug is given as an infusion into a vein. It is administered in a hospital or clinic by a specially trained health care professional. Talk to your pediatrician regarding the use of this medicine in children. Special care may be needed. Overdosage: If you think you have taken too much of this medicine contact a poison control center or emergency room at once. NOTE: This medicine is only for you. Do not share this medicine with others. What if I miss a dose? It is important not to miss your dose. Call your doctor or health care professional if you are unable to keep an appointment. What may interact with this medicine? Do not take this medicine with any of the following medications: -disulfiram -metronidazole This medicine may also interact with the following medications: -cyclosporine -diazepam -ketoconazole -medicines to increase blood counts like filgrastim, pegfilgrastim, sargramostim -other chemotherapy drugs like cisplatin, doxorubicin, epirubicin, etoposide, teniposide, vincristine -quinidine -testosterone -vaccines -verapamil Talk to your doctor or health care professional before taking any of these medicines: -acetaminophen -aspirin -ibuprofen -ketoprofen -naproxen This list may not describe all possible interactions. Give your health care provider a list of all the medicines, herbs, non-prescription drugs, or dietary supplements you use. Also tell them if you smoke, drink alcohol, or use illegal drugs. Some items may interact with your medicine. What should I watch for while using this medicine? Your condition will be monitored carefully while you are receiving this medicine. You will need important blood work done while you are taking this medicine. This medicine can cause serious allergic reactions. To reduce your risk you will need  to take other  medicine(s) before treatment with this medicine. If you experience allergic reactions like skin rash, itching or hives, swelling of the face, lips, or tongue, tell your doctor or health care professional right away. In some cases, you may be given additional medicines to help with side effects. Follow all directions for their use. This drug may make you feel generally unwell. This is not uncommon, as chemotherapy can affect healthy cells as well as cancer cells. Report any side effects. Continue your course of treatment even though you feel ill unless your doctor tells you to stop. Call your doctor or health care professional for advice if you get a fever, chills or sore throat, or other symptoms of a cold or flu. Do not treat yourself. This drug decreases your body's ability to fight infections. Try to avoid being around people who are sick. This medicine may increase your risk to bruise or bleed. Call your doctor or health care professional if you notice any unusual bleeding. Be careful brushing and flossing your teeth or using a toothpick because you may get an infection or bleed more easily. If you have any dental work done, tell your dentist you are receiving this medicine. Avoid taking products that contain aspirin, acetaminophen, ibuprofen, naproxen, or ketoprofen unless instructed by your doctor. These medicines may hide a fever. Do not become pregnant while taking this medicine. Women should inform their doctor if they wish to become pregnant or think they might be pregnant. There is a potential for serious side effects to an unborn child. Talk to your health care professional or pharmacist for more information. Do not breast-feed an infant while taking this medicine. Men are advised not to father a child while receiving this medicine. This product may contain alcohol. Ask your pharmacist or healthcare provider if this medicine contains alcohol. Be sure to tell all healthcare providers you are  taking this medicine. Certain medicines, like metronidazole and disulfiram, can cause an unpleasant reaction when taken with alcohol. The reaction includes flushing, headache, nausea, vomiting, sweating, and increased thirst. The reaction can last from 30 minutes to several hours. What side effects may I notice from receiving this medicine? Side effects that you should report to your doctor or health care professional as soon as possible: -allergic reactions like skin rash, itching or hives, swelling of the face, lips, or tongue -low blood counts - This drug may decrease the number of white blood cells, red blood cells and platelets. You may be at increased risk for infections and bleeding. -signs of infection - fever or chills, cough, sore throat, pain or difficulty passing urine -signs of decreased platelets or bleeding - bruising, pinpoint red spots on the skin, black, tarry stools, nosebleeds -signs of decreased red blood cells - unusually weak or tired, fainting spells, lightheadedness -breathing problems -chest pain -high or low blood pressure -mouth sores -nausea and vomiting -pain, swelling, redness or irritation at the injection site -pain, tingling, numbness in the hands or feet -slow or irregular heartbeat -swelling of the ankle, feet, hands Side effects that usually do not require medical attention (report to your doctor or health care professional if they continue or are bothersome): -bone pain -complete hair loss including hair on your head, underarms, pubic hair, eyebrows, and eyelashes -changes in the color of fingernails -diarrhea -loosening of the fingernails -loss of appetite -muscle or joint pain -red flush to skin -sweating This list may not describe all possible side effects. Call your doctor for medical advice  about side effects. You may report side effects to FDA at 1-800-FDA-1088. Where should I keep my medicine? This drug is given in a hospital or clinic and  will not be stored at home. NOTE: This sheet is a summary. It may not cover all possible information. If you have questions about this medicine, talk to your doctor, pharmacist, or health care provider.  2018 [employer]/Gold Standard (2015-01-27 19:58:00)  Carboplatin injection What is this medicine? CARBOPLATIN (KAR boe pla tin) is a chemotherapy drug. It targets fast dividing cells, like cancer cells, and causes these cells to die. This medicine is used to treat ovarian cancer and many other cancers. This medicine may be used for other purposes; ask your health care provider or pharmacist if you have questions. COMMON BRAND NAME(S): Paraplatin What should I tell my health care provider before I take this medicine? They need to know if you have any of these conditions: -blood disorders -hearing problems -kidney disease -recent or ongoing radiation therapy -an unusual or allergic reaction to carboplatin, cisplatin, other chemotherapy, other medicines, foods, dyes, or preservatives -pregnant or trying to get pregnant -breast-feeding How should I use this medicine? This drug is usually given as an infusion into a vein. It is administered in a hospital or clinic by a specially trained health care professional. Talk to your pediatrician regarding the use of this medicine in children. Special care may be needed. Overdosage: If you think you have taken too much of this medicine contact a poison control center or emergency room at once. NOTE: This medicine is only for you. Do not share this medicine with others. What if I miss a dose? It is important not to miss a dose. Call your doctor or health care professional if you are unable to keep an appointment. What may interact with this medicine? -medicines for seizures -medicines to increase blood counts like filgrastim, pegfilgrastim, sargramostim -some antibiotics like amikacin, gentamicin, neomycin, streptomycin, tobramycin -vaccines Talk to  your doctor or health care professional before taking any of these medicines: -acetaminophen -aspirin -ibuprofen -ketoprofen -naproxen This list may not describe all possible interactions. Give your health care provider a list of all the medicines, herbs, non-prescription drugs, or dietary supplements you use. Also tell them if you smoke, drink alcohol, or use illegal drugs. Some items may interact with your medicine. What should I watch for while using this medicine? Your condition will be monitored carefully while you are receiving this medicine. You will need important blood work done while you are taking this medicine. This drug may make you feel generally unwell. This is not uncommon, as chemotherapy can affect healthy cells as well as cancer cells. Report any side effects. Continue your course of treatment even though you feel ill unless your doctor tells you to stop. In some cases, you may be given additional medicines to help with side effects. Follow all directions for their use. Call your doctor or health care professional for advice if you get a fever, chills or sore throat, or other symptoms of a cold or flu. Do not treat yourself. This drug decreases your body's ability to fight infections. Try to avoid being around people who are sick. This medicine may increase your risk to bruise or bleed. Call your doctor or health care professional if you notice any unusual bleeding. Be careful brushing and flossing your teeth or using a toothpick because you may get an infection or bleed more easily. If you have any dental work done, tell your dentist   you are receiving this medicine. Avoid taking products that contain aspirin, acetaminophen, ibuprofen, naproxen, or ketoprofen unless instructed by your doctor. These medicines may hide a fever. Do not become pregnant while taking this medicine. Women should inform their doctor if they wish to become pregnant or think they might be pregnant. There is a  potential for serious side effects to an unborn child. Talk to your health care professional or pharmacist for more information. Do not breast-feed an infant while taking this medicine. What side effects may I notice from receiving this medicine? Side effects that you should report to your doctor or health care professional as soon as possible: -allergic reactions like skin rash, itching or hives, swelling of the face, lips, or tongue -signs of infection - fever or chills, cough, sore throat, pain or difficulty passing urine -signs of decreased platelets or bleeding - bruising, pinpoint red spots on the skin, black, tarry stools, nosebleeds -signs of decreased red blood cells - unusually weak or tired, fainting spells, lightheadedness -breathing problems -changes in hearing -changes in vision -chest pain -high blood pressure -low blood counts - This drug may decrease the number of white blood cells, red blood cells and platelets. You may be at increased risk for infections and bleeding. -nausea and vomiting -pain, swelling, redness or irritation at the injection site -pain, tingling, numbness in the hands or feet -problems with balance, talking, walking -trouble passing urine or change in the amount of urine Side effects that usually do not require medical attention (report to your doctor or health care professional if they continue or are bothersome): -hair loss -loss of appetite -metallic taste in the mouth or changes in taste This list may not describe all possible side effects. Call your doctor for medical advice about side effects. You may report side effects to FDA at 1-800-FDA-1088. Where should I keep my medicine? This drug is given in a hospital or clinic and will not be stored at home. NOTE: This sheet is a summary. It may not cover all possible information. If you have questions about this medicine, talk to your doctor, pharmacist, or health care provider.  2018 [employer]/Gold  Standard (2007-07-03 14:38:05)

## 2018-04-09 NOTE — Assessment & Plan Note (Signed)
She has significant hyperglycemia with treatment.  I plan to reduce the dose of premedication dexamethasone. She will get 1 dose of insulin extra while on treatment. I advised her on dietary modification

## 2018-04-09 NOTE — Assessment & Plan Note (Signed)
She tolerated treatment well except for very mild grade 1 neuropathy and some loose stool. She has gained a lot of weight and her posttreatment course was complicated by severe hyperglycemia I recommend we continue the same treatment.  I plan to reduce the dose of premedication dexamethasone to avoid severe hyperglycemia again

## 2018-04-09 NOTE — Assessment & Plan Note (Signed)
The cause is unknown.  Observe for now.  The extra dose of insulin might help with the hyperkalemia

## 2018-04-09 NOTE — Progress Notes (Signed)
[place] OFFICE PROGRESS NOTE  Patient Care Team: Tammi Sou, MD as PCP - General (Family Medicine) Harold Hedge, Darrick Grinder, MD as Consulting Physician (Allergy and Immunology) Juanita Craver, MD as Consulting Physician (Gastroenterology) Martinique, Peter M, MD as Consulting Physician (Cardiology) Megan Salon, MD as Consulting Physician (Gynecology) Isabel Caprice, MD as Consulting Physician (Gynecologic Oncology) Everitt Amber, MD as Consulting Physician (Gynecologic Oncology) Heath Lark, MD as Consulting Physician (Hematology and Oncology)  ASSESSMENT & PLAN:  Endometrial cancer [facility]) She tolerated treatment well except for very mild grade 1 neuropathy and some loose stool. She has gained a lot of weight and her posttreatment course was complicated by severe hyperglycemia I recommend we continue the same treatment.  I plan to reduce the dose of premedication dexamethasone to avoid severe hyperglycemia again  Controlled insulin dependent diabetes mellitus with retinopathy ([place]) She has significant hyperglycemia with treatment.  I plan to reduce the dose of premedication dexamethasone. She will get 1 dose of insulin extra while on treatment. I advised her on dietary modification  Bilateral edema of lower extremity She has evidence of lower extremity edema bilaterally and recent weight gain likely due to fluid retention.  We discussed dietary modification including less salt intake  Peripheral neuropathy due to chemotherapy [facility]) she has mild peripheral neuropathy, likely related to side effects of treatment. It is only mild, not bothering the patient. I will observe for now If it gets worse in the future, I will consider modifying the dose of the treatment   Hyperkalemia The cause is unknown.  Observe for now.  The extra dose of insulin might help with the hyperkalemia   No orders of the defined types were placed in this encounter.   INTERVAL  HISTORY: Please see below for problem oriented charting. She returns with her husband for cycle 2 of chemotherapy She tolerated treatment well without nausea.  She has some loose bowel movement with treatment Her surgical wound is healing well She denies symptoms of dyruria She complained of mild intermittent neuropathy but not severe.  She has significant hyperglycemia for few days after treatment  SUMMARY OF ONCOLOGIC HISTORY: Oncology History   Endometrioid MSI     Endometrial cancer ([place])   01/24/2018 Initial Diagnosis    Patient was seen by GYN for PMB since July 2019.  Pt has hx of complex endometrial hyperplasia without atypica that was treated with Mirena IUD for 5 years.  This was removed about two years ago.  She did have follow up biopsies after placement and did have resolution of hyperplasia.    Patient's last menstrual period was 08/10/2010.    01/25/2018 Pathology Results    Endometrium, biopsy - ENDOMETRIOID ADENOCARCINOMA, FIGO GRADE 1. - SEE COMMENT.    01/25/2018 Procedure    She had endometrial biopsy in GYN office    01/25/2018 Imaging    US pelvis  UTERUS: 8.5 x 5.3 x 4.5cm EMS: 18-31m, areas of vascular flow noted as well ADNEXA: Left ovary: not seen transvaginally or transabdominally due to habitus                  Right ovary: same as with left ovary CUL DE SAC: no free fluid     02/19/2018 Pathology Results    Uterus +/- tubes/ovaries, neoplastic, cervix - UTERUS: -ENDO/MYOMETRIUM: INVASIVE ENDOMETRIOID ADENOCARCINOMA WITH FOCAL SQUAMOUS DIFFERENTIATION (FIGO GRADE 1), SPANNING 5.8 CM. TUMOR INVADES LESS THAN ONE HALF OF THE MYOMETRIUM. TUMOR INVOLVES CERVICAL STROMA AND  BILATERAL FALLOPIAN TUBE LUMEN. SEE ONCOLOGY TABLE. -SEROSA: UNREMARKABLE. NO MALIGNANCY. - CERVIX: STROMAL INVOLVEMENT BY ENDOMETRIOID ADENOCARCINOMA. - BILATERAL OVARIES: INCLUSION CYSTS. NO MALIGNANCY. - BILATERAL FALLOPIAN TUBES: LUMEN INVOLVEMENT BY ENDOMETRIOID  ADENOCARCINOMA. Microscopic Comment UTERUS, CARCINOMA OR CARCINOSARCOMA Procedure: Total hysterectomy with bilateral salpingo-oophorectomy. Histologic type: Endometrioid adenocarcinoma with squamous differentiation. Histologic Grade: FIGO grade 1. Myometrial invasion: Depth of invasion: 3 mm Myometrial thickness: 17 mm Uterine Serosa Involvement: Not identified. Cervical stromal involvement: Present. Extent of involvement of other organs: Involves cervical stroma and lumen of bilateral fallopian tubes. Lymphovascular invasion: Not identified. Regional Lymph Nodes: None examined. Tumor block for ancillary studies: 1D-G MMR / MSI testing: Will be ordered.    02/19/2018 Surgery    Surgeon: Donaciano Eva   Operation: Robotic-assisted laparoscopic total hysterectomy with bilateral salpingoophorectomy (22 modifier for extreme obesity - see details in body of operative note)   Operative Findings:  : extreme intraperitoneal adiposity which prevented retroperitoneal visualization of the lymph node basins and increased the duration of the procedure by 1 hour (for additional instrumentation, placement of retraction sutures, additional personnel required for assistance.  8cm uterus, grossly normal ovaries, surgically interrupted tubes    03/05/2018 Imaging    1. Upper normal pelvic sidewall lymph nodes bilaterally. Close attention on follow-up recommended. 2. No other findings to suggest metastatic disease in the chest, abdomen, or pelvis. 3.  Aortic Atherosclerois (ICD10-170.0)     03/12/2018 Cancer Staging    Staging form: Corpus Uteri - Carcinoma and Carcinosarcoma, AJCC 8th Edition - Pathologic: Stage III (pT3, pN0, cM0) - Signed by Heath Lark, MD on 03/12/2018    03/16/2018 Procedure    Ultrasound and fluoroscopically guided right internal jugular single lumen power port catheter insertion. Tip in the SVC/RA junction. Catheter ready for use.    03/20/2018 -  Chemotherapy     The patient had carboplatin and taxol     REVIEW OF SYSTEMS:   Constitutional: Denies fevers, chills or abnormal weight loss Eyes: Denies blurriness of vision Ears, nose, mouth, throat, and face: Denies mucositis or sore throat Respiratory: Denies cough, dyspnea or wheezes Cardiovascular: Denies palpitation, chest discomfort or lower extremity swelling Skin: Denies abnormal skin rashes Lymphatics: Denies new lymphadenopathy or easy bruising Behavioral/Psych: Mood is stable, no new changes  All other systems were reviewed with the patient and are negative.  I have reviewed the past medical history, past surgical history, social history and family history with the patient and they are unchanged from previous note.  ALLERGIES:  is allergic to adhesive [tape]; banana; eggs or egg-derived products; latex; penicillins; pine; and rose.  MEDICATIONS:  Current Outpatient Medications  Medication Sig Dispense Refill  . albuterol (PROVENTIL) (2.5 MG/3ML) 0.083% nebulizer solution Take 3 mLs (2.5 mg total) by nebulization every 4 (four) hours as needed for wheezing or shortness of breath (Dx: J45.50). 75 mL 1  . clobetasol ointment (TEMOVATE) 0.05 % APPLY TO AFFECTED AREA TWICE A DAY (Patient taking differently: Apply 1 application topically 2 (two) times daily as needed (for irritation). ) 60 g 1  . dexamethasone (DECADRON) 4 MG tablet Take 2 tabs at the night before and 2 tab the morning of chemotherapy, every 3 weeks, by mouth 24 tablet 0  . enalapril (VASOTEC) 10 MG tablet TAKE 1 TABLET BY MOUTH EVERY DAY (Patient taking differently: Take 10 mg by mouth daily. ) 90 tablet 1  . fluticasone (CUTIVATE) 0.05 % cream APPLY TO AFFECTED AREA OF R LOWER LEG TWICE PER DAY 30 g  0  . fluticasone (FLONASE SENSIMIST) 27.5 MCG/SPRAY nasal spray Place 1 spray into the nose daily as needed for rhinitis.    . furosemide (LASIX) 20 MG tablet TAKE 1 TABLET BY MOUTH EVERY DAY (Patient taking differently: Take 20 mg  by mouth daily. ) 90 tablet 1  . HYDROcodone-acetaminophen (NORCO/VICODIN) 5-325 MG tablet 1-2 tabs po qd prn pain 60 tablet 0  . insulin aspart (NOVOLOG) 100 UNIT/ML injection USE PER SLIDING SCALE AT EACH MEAL (AVG USE IS 17 UNITS THREE TIMES DAILY) 50 mL 2  . insulin NPH Human (NOVOLIN N) 100 UNIT/ML injection 65 U SQ qAM, 65 U SQ mid day, and 61 U SQ q evening (Patient taking differently: Inject 60 Units into the skin 3 (three) times daily. ) 50 mL 5  . Insulin Syringes, Disposable, U-100 0.3 ML MISC Use to inject insulin--6 injections per day 540 each 3  . levocetirizine (XYZAL) 5 MG tablet Take 5 mg by mouth at bedtime as needed for allergies.   5  . levothyroxine (SYNTHROID, LEVOTHROID) 125 MCG tablet TAKE 1 TABLET BY MOUTH EVERY MORNING ON AN EMPTY STOMACH 90 tablet 0  . lidocaine-prilocaine (EMLA) cream Apply to affected area once 30 g 3  . meloxicam (MOBIC) 15 MG tablet TAKE 1 TABLET BY MOUTH EVERY DAY AS NEEDED (Patient taking differently: Take 15 mg by mouth daily as needed for pain. ) 30 tablet 3  . metFORMIN (GLUCOPHAGE) 500 MG tablet TAKE 1 TABLET BY MOUTH EVERY MORNING AND 2 TABLETS BY MOUTH AT BEDTIME (Patient taking differently: Take 500-1,000 mg by mouth See admin instructions. Take 500 mg by mouth in the morning and take 1000 mg by mouth at supper) 270 tablet 1  . miconazole (MICRO GUARD) 2 % powder Apply 1 application topically 2 (two) times daily as needed for itching.    . montelukast (SINGULAIR) 10 MG tablet Take 10 mg by mouth every evening.     . ondansetron (ZOFRAN) 8 MG tablet Take 1 tablet (8 mg total) by mouth every 8 (eight) hours as needed for refractory nausea / vomiting. Start on day 3 after chemo. 30 tablet 1  . pravastatin (PRAVACHOL) 20 MG tablet TAKE 1 TABLET BY MOUTH EVERY DAY (Patient taking differently: Take 20 mg by mouth daily. ) 90 tablet 2  . Probiotic Product (PROBIOTIC PO) Take 1 capsule by mouth daily.    . prochlorperazine (COMPAZINE) 10 MG tablet Take  1 tablet (10 mg total) by mouth every 6 (six) hours as needed (Nausea or vomiting). 30 tablet 1  . senna-docusate (SENOKOT-S) 8.6-50 MG tablet Take 2 tablets by mouth at bedtime. Hold if having loose stools 30 tablet 1  . SYMBICORT 160-4.5 MCG/ACT inhaler Inhale 2 puffs into the lungs 2 (two) times daily.    . VENTOLIN HFA 108 (90 Base) MCG/ACT inhaler INHALE 2 PUFFS INTO LUNGS EVERY 6 HOURS AS NEEDED FOR WHEEZING/SHORTNESS OF BREATH 18 Inhaler 0   No current facility-administered medications for this visit.     PHYSICAL EXAMINATION: ECOG PERFORMANCE STATUS: 2 - Symptomatic, <50% confined to bed  Vitals:   04/09/18 1013  BP: (!) 154/53  Pulse: 81  Resp: 18  Temp: 98.2 F (36.8 C)  SpO2: 96%   Filed Weights   04/09/18 1013  Weight: 291 lb 6.4 oz (132.2 kg)    GENERAL:alert, no distress and comfortable SKIN: skin color, texture, turgor are normal, no rashes or significant lesions EYES: normal, Conjunctiva are pink and non-injected, sclera clear OROPHARYNX:no exudate,  no erythema and lips, buccal mucosa, and tongue normal  NECK: supple, thyroid normal size, non-tender, without nodularity LYMPH:  no palpable lymphadenopathy in the cervical, axillary or inguinal LUNGS: clear to auscultation and percussion with normal breathing effort HEART: regular rate & rhythm and no murmurs with mild to moderate lower extremity edema ABDOMEN:abdomen soft, non-tender and normal bowel sounds Musculoskeletal:no cyanosis of digits and no clubbing  NEURO: alert & oriented x 3 with fluent speech, no focal motor/sensory deficits  LABORATORY DATA:  I have reviewed the data as listed    Component Value Date/Time   NA 138 04/09/2018 0942   K 5.4 (H) 04/09/2018 0942   CL 104 04/09/2018 0942   CO2 20 (L) 04/09/2018 0942   GLUCOSE 292 (H) 04/09/2018 0942   BUN 25 (H) 04/09/2018 0942   CREATININE 1.10 (H) 04/09/2018 0942   CALCIUM 9.1 04/09/2018 0942   PROT 7.2 04/09/2018 0942   ALBUMIN 3.1 (L)  04/09/2018 0942   AST 24 04/09/2018 0942   ALT 24 04/09/2018 0942   ALKPHOS 70 04/09/2018 0942   BILITOT 0.3 04/09/2018 0942   GFRNONAA 52 (L) 04/09/2018 0942   GFRAA >60 04/09/2018 0942    No results found for: SPEP, UPEP  Lab Results  Component Value Date   WBC 5.8 04/09/2018   NEUTROABS 5.0 04/09/2018   HGB 9.7 (L) 04/09/2018   HCT 30.7 (L) 04/09/2018   MCV 94.2 04/09/2018   PLT 199 04/09/2018      Chemistry      Component Value Date/Time   NA 138 04/09/2018 0942   K 5.4 (H) 04/09/2018 0942   CL 104 04/09/2018 0942   CO2 20 (L) 04/09/2018 0942   BUN 25 (H) 04/09/2018 0942   CREATININE 1.10 (H) 04/09/2018 0942      Component Value Date/Time   CALCIUM 9.1 04/09/2018 0942   ALKPHOS 70 04/09/2018 0942   AST 24 04/09/2018 0942   ALT 24 04/09/2018 0942   BILITOT 0.3 04/09/2018 0942       RADIOGRAPHIC STUDIES: I have personally reviewed the radiological images as listed and agreed with the findings in the report. Ir Imaging Guided Port Insertion  Result Date: 03/16/2018 CLINICAL DATA:  Endometrial carcinoma, access for chemotherapy EXAM: RIGHT INTERNAL JUGULAR SINGLE LUMEN POWER PORT CATHETER INSERTION Date:  03/16/2018 03/16/2018 2:34 pm Radiologist:  Jerilynn Mages. Daryll Brod, MD Guidance:  Ultrasound and fluoroscopic MEDICATIONS: 1.5 g vancomycin; The antibiotic was administered within an appropriate time interval prior to skin puncture. ANESTHESIA/SEDATION: Versed 4 mg IV; Fentanyl 100 mcg IV; Moderate Sedation Time:  28 minutes The patient was continuously monitored during the procedure by the interventional radiology nurse under my direct supervision. FLUOROSCOPY TIME:  One minutes, 0 seconds (27 mGy) COMPLICATIONS: None immediate. CONTRAST:  None. PROCEDURE: Informed consent was obtained from the patient following explanation of the procedure, risks, benefits and alternatives. The patient understands, agrees and consents for the procedure. All questions were addressed. A time out  was performed. Maximal barrier sterile technique utilized including caps, mask, sterile gowns, sterile gloves, large sterile drape, hand hygiene, and 2% chlorhexidine scrub. Under sterile conditions and local anesthesia, right internal jugular micropuncture venous access was performed. Access was performed with ultrasound. Images were obtained for documentation of the patent right internal jugular vein. A guide wire was inserted followed by a transitional dilator. This allowed insertion of a guide wire and catheter into the IVC. Measurements were obtained from the SVC / RA junction back to the right IJ  venotomy site. In the right infraclavicular chest, a subcutaneous pocket was created over the second anterior rib. This was done under sterile conditions and local anesthesia. 1% lidocaine with epinephrine was utilized for this. A 2.5 cm incision was made in the skin. Blunt dissection was performed to create a subcutaneous pocket over the right pectoralis major muscle. The pocket was flushed with saline vigorously. There was adequate hemostasis. The port catheter was assembled and checked for leakage. The port catheter was secured in the pocket with two retention sutures. The tubing was tunneled subcutaneously to the right venotomy site and inserted into the SVC/RA junction through a valved peel-away sheath. Position was confirmed with fluoroscopy. Images were obtained for documentation. The patient tolerated the procedure well. No immediate complications. Incisions were closed in a two layer fashion with 4 - 0 Vicryl suture. Dermabond was applied to the skin. The port catheter was accessed, blood was aspirated followed by saline and heparin flushes. Needle was removed. A dry sterile dressing was applied. IMPRESSION: Ultrasound and fluoroscopically guided right internal jugular single lumen power port catheter insertion. Tip in the SVC/RA junction. Catheter ready for use. Electronically Signed   By: Jerilynn Mages.  Shick M.D.    On: 03/16/2018 14:40    All questions were answered. The patient knows to call the clinic with any problems, questions or concerns. No barriers to learning was detected.  I spent 25 minutes counseling the patient face to face. The total time spent in the appointment was 30 minutes and more than 50% was on counseling and review of test results  Heath Lark, MD 04/09/2018 10:51 AM

## 2018-04-09 NOTE — Assessment & Plan Note (Signed)
She has evidence of lower extremity edema bilaterally and recent weight gain likely due to fluid retention.  We discussed dietary modification including less salt intake

## 2018-04-10 ENCOUNTER — Telehealth: Payer: Self-pay | Admitting: Family Medicine

## 2018-04-10 ENCOUNTER — Other Ambulatory Visit: Payer: Self-pay | Admitting: Family Medicine

## 2018-04-10 DIAGNOSIS — I10 Essential (primary) hypertension: Secondary | ICD-10-CM

## 2018-04-10 NOTE — Telephone Encounter (Signed)
Copied from [place] [id number]. Topic: Quick Communication - See Telephone Encounter >> Apr 10, 2018 12:25 PM Antonieta Iba C wrote: CRM for notification. See Telephone encounter for: 04/10/18.  Pt called in to update PCP with her Blood Sugar readings for the last 2 days.   04/09/18 at 3:00A - 134                     7:00A- 286                     2:00P - 283- post lunch                     5:00P - 261                    11:00P- 218   04/10/18   8:00A- 250 post breakfast                  12:00 noon -267 pre eating   Pt says that just a side note, after she has Chemo she has noticed that she have to use her inhaler more. Pt says that her O2 level was checked yesterday and it read 96 after Chemo

## 2018-04-10 NOTE — Telephone Encounter (Signed)
Increase Humulin N to 67 qBF, 67 q mid day, and 63 q evening.  Increase mealtime insulin to: If premeal glucose is <100, give 0 units ""   ""             ""100-150, give 3 units ''      ''             '' 151-200 give 5 units ''     ''             ''  201-250  Give 8 units ''    ''             ''   251-300 give 10 units '      ''            ''   301-350 give 12 units and call to let our office know that you had to give this much.  -thx

## 2018-04-10 NOTE — Telephone Encounter (Signed)
Please advise. Thanks.  

## 2018-04-12 NOTE — Telephone Encounter (Signed)
Pt advised and voiced understanding.   

## 2018-04-16 ENCOUNTER — Telehealth: Payer: Self-pay | Admitting: Oncology

## 2018-04-16 ENCOUNTER — Telehealth: Payer: Self-pay | Admitting: *Deleted

## 2018-04-16 NOTE — Telephone Encounter (Signed)
Called [name] and asked if she is ready to start radiation treatments.  She said she is feeling good and would like to start them next week.  Notified Romie Jumper, Medical Secretary to schedule treatments.

## 2018-04-16 NOTE — Telephone Encounter (Signed)
Called patient to inform of New HDR [facility], spoke with patient and she is aware of these appts. 

## 2018-04-23 ENCOUNTER — Other Ambulatory Visit: Payer: Self-pay | Admitting: Family Medicine

## 2018-04-23 ENCOUNTER — Telehealth: Payer: Self-pay | Admitting: *Deleted

## 2018-04-23 NOTE — Telephone Encounter (Signed)
Last Filled: 01/03/2018 #60, 0 refill Last OV: 03/15/2018

## 2018-04-23 NOTE — Telephone Encounter (Signed)
Copied from [place] [id number]. Topic: Quick Communication - Rx Refill/Question >> Apr 23, 2018 11:51 AM Alanda Slim E wrote: Medication: HYDROcodone-acetaminophen (NORCO/VICODIN) 5-325 MG tablet   Has the patient contacted their pharmacy? Yes   Preferred Pharmacy (with phone number or street name): [id number] [address], [place] - [place] [phone] (Phone) [phone] (Fax)    Agent: Please be advised that RX refills may take up to 3 business days. We ask that you follow-up with your pharmacy.

## 2018-04-23 NOTE — Telephone Encounter (Signed)
CALLED PATIENT TO REMIND OF NEW HDR [place] FOR 04-24-18, SPOKE WITH PATIENT AND SHE IS AWARE OF THESE APPTS.

## 2018-04-24 ENCOUNTER — Other Ambulatory Visit: Payer: Self-pay

## 2018-04-24 ENCOUNTER — Ambulatory Visit
Admission: RE | Admit: 2018-04-24 | Discharge: 2018-04-24 | Disposition: A | Discharge status: Home / Self Care | Payer: Medicare Other | Source: Ambulatory Visit | Attending: Radiation Oncology | Admitting: Radiation Oncology

## 2018-04-24 ENCOUNTER — Ambulatory Visit
Admission: RE | Admit: 2018-04-24 | Discharge: 2018-04-24 | Disposition: A | Discharge status: Home / Self Care | Payer: Medicare Other | Source: Ambulatory Visit | Attending: Family Medicine | Admitting: Family Medicine

## 2018-04-24 ENCOUNTER — Encounter: Payer: Self-pay | Admitting: Radiation Oncology

## 2018-04-24 VITALS — BP 164/61 | HR 90 | Temp 97.9°F | Resp 20 | Ht 61.0 in | Wt 287.2 lb

## 2018-04-24 DIAGNOSIS — C541 Malignant neoplasm of endometrium: Secondary | ICD-10-CM | POA: Insufficient documentation

## 2018-04-24 DIAGNOSIS — Z79899 Other long term (current) drug therapy: Secondary | ICD-10-CM | POA: Diagnosis not present

## 2018-04-24 DIAGNOSIS — Z9221 Personal history of antineoplastic chemotherapy: Secondary | ICD-10-CM | POA: Insufficient documentation

## 2018-04-24 DIAGNOSIS — Z4689 Encounter for fitting and adjustment of other specified devices: Secondary | ICD-10-CM | POA: Diagnosis not present

## 2018-04-24 DIAGNOSIS — Z794 Long term (current) use of insulin: Secondary | ICD-10-CM | POA: Diagnosis not present

## 2018-04-24 NOTE — Progress Notes (Signed)
  Radiation Oncology         [phone] ________________________________  Name: [name] MRN: [id number]  Date: 04/24/2018  DOB: September 26, 1950  CC: McGowen, Adrian Blackwater, MD  Everitt Amber, MD  HDR BRACHYTHERAPY NOTE  DIAGNOSIS: Stage IIIA endometroid endometrial carcinoma.   Simple treatment device note: Patient had construction of her custom vaginal cylinder. She will be treated with a 2.5 cm diameter segmented cylinder. This conforms to her anatomy without undue discomfort.  Vaginal brachytherapy procedure node: The patient was brought to the [place] suite. Identity was confirmed. All relevant records and images related to the planned course of therapy were reviewed. The patient freely provided informed written consent to proceed with treatment after reviewing the details related to the planned course of therapy. The consent form was witnessed and verified by the simulation staff. Then, the patient was set-up in a stable reproducible supine position for radiation therapy. Pelvic exam revealed the vaginal cuff to be intact . The patient's custom vaginal cylinder was placed in the proximal vagina. This was affixed to the CT/MR stabilization plate to prevent slippage. Patient tolerated the placement well.  Verification simulation note:  A fiducial marker was placed within the vaginal cylinder. An AP and lateral film was then obtained through the pelvis area. This documented accurate position of the vaginal cylinder for treatment.  HDR BRACHYTHERAPY TREATMENT  The remote afterloading device was affixed to the vaginal cylinder by catheter. Patient then proceeded to undergo her first high-dose-rate treatment directed at the proximal vagina. The patient was prescribed a dose of 6.0 gray to be delivered to the mucosal surface. Treatment length was 3.0 cm. Patient was treated with 1 channel using 7 dwell positions. Treatment time was 145.9 seconds. Iridium 192 was the high-dose-rate source for treatment.  The patient tolerated the treatment well. After completion of her therapy, a radiation survey was performed documenting return of the iridium source into the [employer] safe.   PLAN: patient will return next week for her second high-dose-rate treatment. ________________________________  Blair Promise, PhD, MD

## 2018-04-24 NOTE — Progress Notes (Signed)
Pt presents today for new [place] [facility]. Pt is unaccompanied. Pt reports moderate fatigue. Pt is receiving chemotherapy every three weeks, with next infusion scheduled for next. Pt c/o pain in right wrist and sciatica. Pt denies dysuria/hematuria. Pt denies vaginal bleeding/discharge. Pt denies rectal bleeding. Pt reports diarrhea and is taking Imodium as needed.   BP (!) 164/61 (BP Location: Left Arm, Patient Position: Sitting)   Pulse 90   Temp 97.9 F (36.6 C) (Oral)   Resp 20   Ht 5\' 1"  (1.549 m)   Wt 287 lb 3.2 oz (130.3 kg)   LMP 08/10/2010 Comment: Hysterectomy 02/02/18  SpO2 97%   BMI 54.27 kg/m   Wt Readings from Last 3 Encounters:  04/24/18 287 lb 3.2 oz (130.3 kg)  04/09/18 291 lb 6.4 oz (132.2 kg)  03/19/18 284 lb 12.8 oz (129.2 kg)   Loma Sousa, RN BSN

## 2018-04-24 NOTE — Progress Notes (Signed)
  Radiation Oncology         [phone] ________________________________  Name: Allison CANNELLA MRN: [id number]  Date: 04/24/2018  DOB: 08/24/1950  SIMULATION AND TREATMENT PLANNING NOTE HDR BRACHYTHERAPY  DIAGNOSIS:  Stage IIIA endometroid endometrial carcinoma.  NARRATIVE:  The patient was brought to the [place].  Identity was confirmed.  All relevant records and images related to the planned course of therapy were reviewed.  The patient freely provided informed written consent to proceed with treatment after reviewing the details related to the planned course of therapy. The consent form was witnessed and verified by the simulation staff.  Then, the patient was set-up in a stable reproducible  supine position for radiation therapy.  CT images were obtained.  Surface markings were placed.  The CT images were loaded into the planning software.  Then the target and avoidance structures were contoured.  Treatment planning then occurred.  The radiation prescription was entered and confirmed.   I have requested : Brachytherapy Isodose Plan and Dosimetry Calculations to plan the radiation distribution.    PLAN:  The patient will receive 30 Gy in 5 fractions. Treatments will be weekly. Patient will be treated with a 2.5 cm diameter cylinder with a treatment length of 3 cm. Prescription will be to the vaginal mucosal surface. Treatments directed at the vaginal cuff region. Iridium 192 will be the high-dose-rate source.    ________________________________  Blair Promise, PhD, MD

## 2018-04-24 NOTE — Progress Notes (Signed)
Radiation Oncology         [phone] ________________________________  Name: [name] MRN: [id number]  Date: 04/24/2018  DOB: 1950/12/19  Vaginal Brachytherapy Procedure Note  CC: McGowen, Adrian Blackwater, MD Everitt Amber, MD    ICD-10-CM   1. Endometrial cancer (HCC) C54.1     Diagnosis: Stage IIIA endometroid endometrial carcinoma.    Narrative: She returns today for vaginal cylinder fitting. She has completed 2 cycles of chemotherapy and is tolerating this well so far. She denies any vaginal bleeding or discharge or pelvic pain.  ALLERGIES: is allergic to adhesive [tape]; banana; eggs or egg-derived products; latex; penicillins; pine; and rose.  Meds: Current Outpatient Medications  Medication Sig Dispense Refill  . albuterol (PROVENTIL) (2.5 MG/3ML) 0.083% nebulizer solution Take 3 mLs (2.5 mg total) by nebulization every 4 (four) hours as needed for wheezing or shortness of breath (Dx: J45.50). 75 mL 1  . clobetasol ointment (TEMOVATE) 0.05 % APPLY TO AFFECTED AREA TWICE A DAY (Patient taking differently: Apply 1 application topically 2 (two) times daily as needed (for irritation). ) 60 g 1  . dexamethasone (DECADRON) 4 MG tablet Take 2 tabs at the night before and 2 tab the morning of chemotherapy, every 3 weeks, by mouth 24 tablet 0  . enalapril (VASOTEC) 10 MG tablet TAKE 1 TABLET BY MOUTH EVERY DAY 90 tablet 1  . fluticasone (CUTIVATE) 0.05 % cream APPLY TO AFFECTED AREA OF R LOWER LEG TWICE PER DAY 30 g 0  . fluticasone (FLONASE SENSIMIST) 27.5 MCG/SPRAY nasal spray Place 1 spray into the nose daily as needed for rhinitis.    . furosemide (LASIX) 20 MG tablet TAKE 1 TABLET BY MOUTH EVERY DAY (Patient taking differently: Take 20 mg by mouth daily. ) 90 tablet 1  . HYDROcodone-acetaminophen (NORCO/VICODIN) 5-325 MG tablet 1-2 tabs po qd prn pain 60 tablet 0  . insulin aspart (NOVOLOG) 100 UNIT/ML injection USE PER SLIDING SCALE AT EACH MEAL (AVG USE IS 17 UNITS THREE TIMES  DAILY) 50 mL 2  . insulin NPH Human (NOVOLIN N) 100 UNIT/ML injection 65 U SQ qAM, 65 U SQ mid day, and 61 U SQ q evening (Patient taking differently: Inject 60 Units into the skin 3 (three) times daily. ) 50 mL 5  . Insulin Syringes, Disposable, U-100 0.3 ML MISC Use to inject insulin--6 injections per day 540 each 3  . levocetirizine (XYZAL) 5 MG tablet Take 5 mg by mouth at bedtime as needed for allergies.   5  . levothyroxine (SYNTHROID, LEVOTHROID) 125 MCG tablet TAKE 1 TABLET BY MOUTH EVERY MORNING ON AN EMPTY STOMACH 90 tablet 0  . lidocaine-prilocaine (EMLA) cream Apply to affected area once 30 g 3  . meloxicam (MOBIC) 15 MG tablet TAKE 1 TABLET BY MOUTH EVERY DAY AS NEEDED (Patient taking differently: Take 15 mg by mouth daily as needed for pain. ) 30 tablet 3  . metFORMIN (GLUCOPHAGE) 500 MG tablet TAKE 1 TABLET BY MOUTH EVERY MORNING AND 2 TABLETS BY MOUTH AT BEDTIME 270 tablet 1  . miconazole (MICRO GUARD) 2 % powder Apply 1 application topically 2 (two) times daily as needed for itching.    . montelukast (SINGULAIR) 10 MG tablet Take 10 mg by mouth every evening.     . ondansetron (ZOFRAN) 8 MG tablet Take 1 tablet (8 mg total) by mouth every 8 (eight) hours as needed for refractory nausea / vomiting. Start on day 3 after chemo. 30 tablet 1  . pravastatin (  PRAVACHOL) 20 MG tablet TAKE 1 TABLET BY MOUTH EVERY DAY (Patient taking differently: Take 20 mg by mouth daily. ) 90 tablet 2  . Probiotic Product (PROBIOTIC PO) Take 1 capsule by mouth daily.    . prochlorperazine (COMPAZINE) 10 MG tablet Take 1 tablet (10 mg total) by mouth every 6 (six) hours as needed (Nausea or vomiting). 30 tablet 1  . senna-docusate (SENOKOT-S) 8.6-50 MG tablet Take 2 tablets by mouth at bedtime. Hold if having loose stools 30 tablet 1  . SYMBICORT 160-4.5 MCG/ACT inhaler Inhale 2 puffs into the lungs 2 (two) times daily.    . VENTOLIN HFA 108 (90 Base) MCG/ACT inhaler INHALE 2 PUFFS INTO LUNGS EVERY 6 HOURS AS  NEEDED FOR WHEEZING/SHORTNESS OF BREATH 18 Inhaler 0   No current facility-administered medications for this encounter.     Physical Findings: The patient is in no acute distress. Patient is alert and oriented.  height is 5\' 1"  (1.549 m) and weight is 287 lb 3.2 oz (130.3 kg). Her oral temperature is 97.9 F (36.6 C). Her blood pressure is 164/61 (abnormal) and her pulse is 90. Her respiration is 20 and oxygen saturation is 97%.     On pelvic examination the external genitalia were unremarkable. A speculum exam was performed. Vaginal cuff was difficult to see due to patients habitus. . On bimanual exam the cuff appeared to be intact.  Lab Findings: Lab Results  Component Value Date   WBC 5.8 04/09/2018   HGB 9.7 (L) 04/09/2018   HCT 30.7 (L) 04/09/2018   MCV 94.2 04/09/2018   PLT 199 04/09/2018    Radiographic Findings: No results found.  Impression: Stage IIIA endometroid endometrial carcinoma. Patient is now ready to proceed with her vaginal brachytherapy treatments. It has been 9 weeks since her surgery.  Patient was fitted for a vaginal cylinder. The patient will be treated with a 2.5 cm diameter cylinder with a treatment length of 3.0 cm. This distended the vaginal vault without undue discomfort. The patient tolerated the procedure well.  The patient was successfully fitted for a vaginal cylinder. The patient is appropriate to begin vaginal brachytherapy.   Plan: The patient will proceed with CT simulation and vaginal brachytherapy today.    _______________________________   Blair Promise, PhD, MD

## 2018-04-25 ENCOUNTER — Other Ambulatory Visit: Payer: Self-pay | Admitting: Family Medicine

## 2018-04-26 MED ORDER — HYDROCODONE-ACETAMINOPHEN 5-325 MG PO TABS: ORAL_TABLET | ORAL | 0 refills | Status: DC

## 2018-04-26 NOTE — Telephone Encounter (Signed)
Advised pt to make appt for pain management in order to continue to get refills. Pt stated she was trying to come as least as possible due to the cold/ flu season due to her taking chemo right now. I advised patient I would let MD know but policy is she would have to come for a appt to obtain refill on pain meds

## 2018-04-26 NOTE — Telephone Encounter (Signed)
I will send rx for 1 mo supply, but she needs appt before she runs out of this b/c it has been a little more than 3 mo since I've seen her for her chronic pain.-thx

## 2018-04-30 ENCOUNTER — Encounter: Payer: Self-pay | Admitting: Hematology and Oncology

## 2018-04-30 ENCOUNTER — Ambulatory Visit: Payer: Medicare Other | Admitting: Radiation Oncology

## 2018-04-30 ENCOUNTER — Inpatient Hospital Stay: Payer: Medicare Other | Attending: Obstetrics

## 2018-04-30 ENCOUNTER — Inpatient Hospital Stay: Payer: Medicare Other

## 2018-04-30 ENCOUNTER — Inpatient Hospital Stay (HOSPITAL_BASED_OUTPATIENT_CLINIC_OR_DEPARTMENT_OTHER): Payer: Medicare Other | Admitting: Hematology and Oncology

## 2018-04-30 ENCOUNTER — Other Ambulatory Visit: Payer: Self-pay | Admitting: Hematology and Oncology

## 2018-04-30 ENCOUNTER — Ambulatory Visit: Payer: Self-pay

## 2018-04-30 VITALS — BP 168/53 | HR 90 | Temp 97.8°F | Resp 18 | Ht 61.0 in | Wt 281.8 lb

## 2018-04-30 DIAGNOSIS — E11319 Type 2 diabetes mellitus with unspecified diabetic retinopathy without macular edema: Secondary | ICD-10-CM

## 2018-04-30 DIAGNOSIS — T451X5S Adverse effect of antineoplastic and immunosuppressive drugs, sequela: Secondary | ICD-10-CM | POA: Diagnosis not present

## 2018-04-30 DIAGNOSIS — E1142 Type 2 diabetes mellitus with diabetic polyneuropathy: Secondary | ICD-10-CM | POA: Insufficient documentation

## 2018-04-30 DIAGNOSIS — R5383 Other fatigue: Secondary | ICD-10-CM

## 2018-04-30 DIAGNOSIS — Z90722 Acquired absence of ovaries, bilateral: Secondary | ICD-10-CM | POA: Diagnosis not present

## 2018-04-30 DIAGNOSIS — G620 Drug-induced polyneuropathy: Secondary | ICD-10-CM | POA: Diagnosis not present

## 2018-04-30 DIAGNOSIS — Z5111 Encounter for antineoplastic chemotherapy: Secondary | ICD-10-CM | POA: Diagnosis not present

## 2018-04-30 DIAGNOSIS — Z79899 Other long term (current) drug therapy: Secondary | ICD-10-CM

## 2018-04-30 DIAGNOSIS — C541 Malignant neoplasm of endometrium: Secondary | ICD-10-CM

## 2018-04-30 DIAGNOSIS — Z90710 Acquired absence of both cervix and uterus: Secondary | ICD-10-CM

## 2018-04-30 DIAGNOSIS — D61818 Other pancytopenia: Secondary | ICD-10-CM | POA: Diagnosis not present

## 2018-04-30 DIAGNOSIS — IMO0001 Reserved for inherently not codable concepts without codable children: Secondary | ICD-10-CM

## 2018-04-30 DIAGNOSIS — E785 Hyperlipidemia, unspecified: Secondary | ICD-10-CM

## 2018-04-30 DIAGNOSIS — E1165 Type 2 diabetes mellitus with hyperglycemia: Secondary | ICD-10-CM | POA: Diagnosis not present

## 2018-04-30 DIAGNOSIS — E875 Hyperkalemia: Secondary | ICD-10-CM | POA: Diagnosis not present

## 2018-04-30 DIAGNOSIS — Z794 Long term (current) use of insulin: Secondary | ICD-10-CM | POA: Diagnosis not present

## 2018-04-30 DIAGNOSIS — T451X5A Adverse effect of antineoplastic and immunosuppressive drugs, initial encounter: Secondary | ICD-10-CM

## 2018-04-30 LAB — COMPREHENSIVE METABOLIC PANEL
ALT: 28 U/L (ref 0–44)
AST: 23 U/L (ref 15–41)
Albumin: 3.4 g/dL — ABNORMAL LOW (ref 3.5–5.0)
Alkaline Phosphatase: 82 U/L (ref 38–126)
Anion gap: 13 (ref 5–15)
BUN: 29 mg/dL — ABNORMAL HIGH (ref 8–23)
CHLORIDE: 106 mmol/L (ref 98–111)
CO2: 21 mmol/L — ABNORMAL LOW (ref 22–32)
Calcium: 9.2 mg/dL (ref 8.9–10.3)
Creatinine, Ser: 1.08 mg/dL — ABNORMAL HIGH (ref 0.44–1.00)
GFR calc Af Amer: >60 mL/min (ref >60)
GFR calc non Af Amer: 53 mL/min — ABNORMAL LOW (ref >60)
Glucose, Bld: 338 mg/dL — ABNORMAL HIGH (ref 70–99)
Potassium: 5.7 mmol/L — ABNORMAL HIGH (ref 3.5–5.1)
Sodium: 140 mmol/L (ref 135–145)
Total Bilirubin: 0.3 mg/dL (ref 0.3–1.2)
Total Protein: 7.6 g/dL (ref 6.5–8.1)

## 2018-04-30 LAB — CBC WITH DIFFERENTIAL/PLATELET
Abs Immature Granulocytes: 0.16 10*3/uL — ABNORMAL HIGH (ref 0.00–0.07)
BASOS ABS: 0 10*3/uL (ref 0.0–0.1)
Basophils Relative: 0 %
Eosinophils Absolute: 0 10*3/uL (ref 0.0–0.5)
Eosinophils Relative: 0 %
HCT: 29.9 % — ABNORMAL LOW (ref 36.0–46.0)
Hemoglobin: 9.7 g/dL — ABNORMAL LOW (ref 12.0–15.0)
IMMATURE GRANULOCYTES: 2 %
Lymphocytes Relative: 10 %
Lymphs Abs: 0.7 10*3/uL (ref 0.7–4.0)
MCH: 30.8 pg (ref 26.0–34.0)
MCHC: 32.4 g/dL (ref 30.0–36.0)
MCV: 94.9 fL (ref 80.0–100.0)
Monocytes Absolute: 0.1 10*3/uL (ref 0.1–1.0)
Monocytes Relative: 2 %
NRBC: 0 % (ref 0.0–0.2)
Neutro Abs: 5.8 10*3/uL (ref 1.7–7.7)
Neutrophils Relative %: 86 %
Platelets: 109 10*3/uL — ABNORMAL LOW (ref 150–400)
RBC: 3.15 MIL/uL — ABNORMAL LOW (ref 3.87–5.11)
RDW: 17 % — ABNORMAL HIGH (ref 11.5–15.5)
WBC: 6.8 10*3/uL (ref 4.0–10.5)

## 2018-04-30 MED ORDER — INSULIN REGULAR HUMAN 100 UNIT/ML IJ SOLN
15.0000 [IU] | Freq: Once | INTRAMUSCULAR | Status: AC
  Administered 2018-04-30: 15 [IU] via SUBCUTANEOUS
  Filled 2018-04-30: qty 10

## 2018-04-30 MED ORDER — SODIUM POLYSTYRENE SULFONATE 15 GM/60ML PO SUSP: 15.0000 g | Freq: Once | ORAL | 0 refills | Status: AC

## 2018-04-30 MED ORDER — SODIUM CHLORIDE 0.9 % IV SOLN
Freq: Once | INTRAVENOUS | Status: AC
  Administered 2018-04-30: 11:00:00 via INTRAVENOUS
  Filled 2018-04-30: qty 250

## 2018-04-30 MED ORDER — SODIUM CHLORIDE 0.9 % IV SOLN
750.0000 mg | Freq: Once | INTRAVENOUS | Status: AC
  Administered 2018-04-30: 750 mg via INTRAVENOUS
  Filled 2018-04-30: qty 75

## 2018-04-30 MED ORDER — FAMOTIDINE IN NACL 20-0.9 MG/50ML-% IV SOLN
INTRAVENOUS | Status: AC
  Filled 2018-04-30: qty 50

## 2018-04-30 MED ORDER — HEPARIN SOD (PORK) LOCK FLUSH 100 UNIT/ML IV SOLN
500.0000 [IU] | Freq: Once | INTRAVENOUS | Status: AC | PRN
  Administered 2018-04-30: 500 [IU]
  Filled 2018-04-30: qty 5

## 2018-04-30 MED ORDER — PALONOSETRON HCL INJECTION 0.25 MG/5ML
INTRAVENOUS | Status: AC
  Filled 2018-04-30: qty 5

## 2018-04-30 MED ORDER — SODIUM CHLORIDE 0.9% FLUSH
10.0000 mL | Freq: Once | INTRAVENOUS | Status: AC
  Administered 2018-04-30: 10 mL
  Filled 2018-04-30: qty 10

## 2018-04-30 MED ORDER — SODIUM CHLORIDE 0.9% FLUSH
10.0000 mL | INTRAVENOUS | Status: DC | PRN
  Administered 2018-04-30: 10 mL
  Filled 2018-04-30: qty 10

## 2018-04-30 MED ORDER — DIPHENHYDRAMINE HCL 50 MG/ML IJ SOLN
50.0000 mg | Freq: Once | INTRAMUSCULAR | Status: AC
  Administered 2018-04-30: 50 mg via INTRAVENOUS

## 2018-04-30 MED ORDER — SODIUM CHLORIDE 0.9 % IV SOLN
Freq: Once | INTRAVENOUS | Status: AC
  Administered 2018-04-30: 11:00:00 via INTRAVENOUS
  Filled 2018-04-30: qty 5

## 2018-04-30 MED ORDER — PALONOSETRON HCL INJECTION 0.25 MG/5ML
0.2500 mg | Freq: Once | INTRAVENOUS | Status: AC
  Administered 2018-04-30: 0.25 mg via INTRAVENOUS

## 2018-04-30 MED ORDER — FAMOTIDINE IN NACL 20-0.9 MG/50ML-% IV SOLN
20.0000 mg | Freq: Once | INTRAVENOUS | Status: AC
  Administered 2018-04-30: 20 mg via INTRAVENOUS

## 2018-04-30 MED ORDER — DIPHENHYDRAMINE HCL 50 MG/ML IJ SOLN
INTRAMUSCULAR | Status: AC
  Filled 2018-04-30: qty 1

## 2018-04-30 MED ORDER — SODIUM CHLORIDE 0.9 % IV SOLN
140.0000 mg/m2 | Freq: Once | INTRAVENOUS | Status: AC
  Administered 2018-04-30: 330 mg via INTRAVENOUS
  Filled 2018-04-30: qty 55

## 2018-04-30 NOTE — Assessment & Plan Note (Signed)
She was placed on lisinopril by her primary doctor I recommend discontinuation of ACE inhibitor and will prescribe 1 dose of Kayexalate The additional insulin dose should help lower her potassium

## 2018-04-30 NOTE — Progress Notes (Signed)
Madelynn Done, RN. Per Dr. Alvy Bimler, 15 units of insulin ordered for today under supportive plan. Tell patient to stop morning Dexamethasone on day of treatment, continue the night before dexamethasone. Stop Lisinopril and contact PCP for a different blood pressure medication. Kayexalate Rx sent to pharmacy for patient to take when she gets home.

## 2018-04-30 NOTE — Assessment & Plan Note (Signed)
She has poorly controlled diabetes I plan to omit oral premed dexamethasone in the morning of treatment and continue to work with the patient to reduce carbohydrate intake around the time of treatment She will get additional dose of insulin today

## 2018-04-30 NOTE — Assessment & Plan Note (Signed)
She has mild pancytopenia from treatment She is not symptomatic Observe only for now

## 2018-04-30 NOTE — Assessment & Plan Note (Signed)
she has mild peripheral neuropathy, likely related to side effects of treatment. It is only mild, not bothering the patient. I will observe for now If it gets worse in the future, I will consider modifying the dose of the treatment  

## 2018-04-30 NOTE — Assessment & Plan Note (Signed)
She tolerated treatment well except for very mild grade 1 neuropathy, nausea and severe hyperglycemia. I recommend we continue the same treatment.  I plan to omit oral premedication dexamethasone the morning of treatment to avoid severe hyperglycemia again

## 2018-04-30 NOTE — Telephone Encounter (Signed)
Summary: too much potassium from medication   Pt calling requesting to speak with a nurse about Enalapril. States she has been on this medication for years and states that at her chemo appt today she was told her potassium was too high. Chemo doctor believes it is coming from this medication. Pt wants to discuss this with a nurse because the chemo doctor wants her to come off of this.

## 2018-04-30 NOTE — Patient Instructions (Addendum)
[place] Discharge Instructions for Patients Receiving Chemotherapy  Reminder:  Stop morning Dexamethasone on day of treatment, continue the night before dexamethasone. Stop Lisinopril and contact PCP for a different blood pressure medication. Kayexalate Rx sent to pharmacy, pick up today and take at home.    Today you received the following chemotherapy agents Paclitaxel and Carboplatin.   To help prevent nausea and vomiting after your treatment, we encourage you to take your nausea medication as prescribed.   If you develop nausea and vomiting that is not controlled by your nausea medication, call the clinic.   BELOW ARE SYMPTOMS THAT SHOULD BE REPORTED IMMEDIATELY:  *FEVER GREATER THAN 100.5 F  *CHILLS WITH OR WITHOUT FEVER  NAUSEA AND VOMITING THAT IS NOT CONTROLLED WITH YOUR NAUSEA MEDICATION  *UNUSUAL SHORTNESS OF BREATH  *UNUSUAL BRUISING OR BLEEDING  TENDERNESS IN MOUTH AND THROAT WITH OR WITHOUT PRESENCE OF ULCERS  *URINARY PROBLEMS  *BOWEL PROBLEMS  UNUSUAL RASH Items with * indicate a potential emergency and should be followed up as soon as possible.  Feel free to call the clinic should you have any questions or concerns. The clinic phone number is [phone].  Please show the [facility] at check-in to the Emergency Department and triage nurse.

## 2018-04-30 NOTE — Progress Notes (Signed)
[place] OFFICE PROGRESS NOTE  Patient Care Team: Tammi Sou, MD as PCP - General (Family Medicine) Harold Hedge, Darrick Grinder, MD as Consulting Physician (Allergy and Immunology) Juanita Craver, MD as Consulting Physician (Gastroenterology) Martinique, Peter M, MD as Consulting Physician (Cardiology) Megan Salon, MD as Consulting Physician (Gynecology) Isabel Caprice, MD as Consulting Physician (Gynecologic Oncology) Everitt Amber, MD as Consulting Physician (Gynecologic Oncology) Heath Lark, MD as Consulting Physician (Hematology and Oncology)  ASSESSMENT & PLAN:  Endometrial cancer [facility]) She tolerated treatment well except for very mild grade 1 neuropathy, nausea and severe hyperglycemia. I recommend we continue the same treatment.  I plan to omit oral premedication dexamethasone the morning of treatment to avoid severe hyperglycemia again  Hyperkalemia She was placed on lisinopril by her primary doctor I recommend discontinuation of ACE inhibitor and will prescribe 1 dose of Kayexalate The additional insulin dose should help lower her potassium  Controlled insulin dependent diabetes mellitus with retinopathy ([place]) She has poorly controlled diabetes I plan to omit oral premed dexamethasone in the morning of treatment and continue to work with the patient to reduce carbohydrate intake around the time of treatment She will get additional dose of insulin today  Peripheral neuropathy due to chemotherapy [facility]) she has mild peripheral neuropathy, likely related to side effects of treatment. It is only mild, not bothering the patient. I will observe for now If it gets worse in the future, I will consider modifying the dose of the treatment   Pancytopenia, acquired [facility]) She has mild pancytopenia from treatment She is not symptomatic Observe only for now   No orders of the defined types were placed in this encounter.   INTERVAL HISTORY: Please see below for  problem oriented charting. She returns for further follow-up She complained of some mild fatigue No worsening peripheral neuropathy She felt that her blood sugar control at home has been fair She has some mild nausea but no vomiting.  No recent constipation The patient denies any recent signs or symptoms of bleeding such as spontaneous epistaxis, hematuria or hematochezia.   SUMMARY OF ONCOLOGIC HISTORY: Oncology History   Endometrioid MSI     Endometrial cancer ([place])   01/24/2018 Initial Diagnosis    Patient was seen by GYN for PMB since July 2019.  Pt has hx of complex endometrial hyperplasia without atypica that was treated with Mirena IUD for 5 years.  This was removed about two years ago.  She did have follow up biopsies after placement and did have resolution of hyperplasia.    Patient's last menstrual period was 08/10/2010.    01/25/2018 Pathology Results    Endometrium, biopsy - ENDOMETRIOID ADENOCARCINOMA, FIGO GRADE 1. - SEE COMMENT.    01/25/2018 Procedure    She had endometrial biopsy in GYN office    01/25/2018 Imaging    US pelvis  UTERUS: 8.5 x 5.3 x 4.5cm EMS: 18-69m, areas of vascular flow noted as well ADNEXA: Left ovary: not seen transvaginally or transabdominally due to habitus                  Right ovary: same as with left ovary CUL DE SAC: no free fluid     02/19/2018 Pathology Results    Uterus +/- tubes/ovaries, neoplastic, cervix - UTERUS: -ENDO/MYOMETRIUM: INVASIVE ENDOMETRIOID ADENOCARCINOMA WITH FOCAL SQUAMOUS DIFFERENTIATION (FIGO GRADE 1), SPANNING 5.8 CM. TUMOR INVADES LESS THAN ONE HALF OF THE MYOMETRIUM. TUMOR INVOLVES CERVICAL STROMA AND BILATERAL FALLOPIAN TUBE LUMEN. SEE ONCOLOGY  TABLE. -SEROSA: UNREMARKABLE. NO MALIGNANCY. - CERVIX: STROMAL INVOLVEMENT BY ENDOMETRIOID ADENOCARCINOMA. - BILATERAL OVARIES: INCLUSION CYSTS. NO MALIGNANCY. - BILATERAL FALLOPIAN TUBES: LUMEN INVOLVEMENT BY ENDOMETRIOID ADENOCARCINOMA. Microscopic  Comment UTERUS, CARCINOMA OR CARCINOSARCOMA Procedure: Total hysterectomy with bilateral salpingo-oophorectomy. Histologic type: Endometrioid adenocarcinoma with squamous differentiation. Histologic Grade: FIGO grade 1. Myometrial invasion: Depth of invasion: 3 mm Myometrial thickness: 17 mm Uterine Serosa Involvement: Not identified. Cervical stromal involvement: Present. Extent of involvement of other organs: Involves cervical stroma and lumen of bilateral fallopian tubes. Lymphovascular invasion: Not identified. Regional Lymph Nodes: None examined. Tumor block for ancillary studies: 1D-G MMR / MSI testing: Will be ordered.    02/19/2018 Surgery    Surgeon: Donaciano Eva   Operation: Robotic-assisted laparoscopic total hysterectomy with bilateral salpingoophorectomy (22 modifier for extreme obesity - see details in body of operative note)   Operative Findings:  : extreme intraperitoneal adiposity which prevented retroperitoneal visualization of the lymph node basins and increased the duration of the procedure by 1 hour (for additional instrumentation, placement of retraction sutures, additional personnel required for assistance.  8cm uterus, grossly normal ovaries, surgically interrupted tubes    03/05/2018 Imaging    1. Upper normal pelvic sidewall lymph nodes bilaterally. Close attention on follow-up recommended. 2. No other findings to suggest metastatic disease in the chest, abdomen, or pelvis. 3.  Aortic Atherosclerois (ICD10-170.0)     03/12/2018 Cancer Staging    Staging form: Corpus Uteri - Carcinoma and Carcinosarcoma, AJCC 8th Edition - Pathologic: Stage III (pT3, pN0, cM0) - Signed by Heath Lark, MD on 03/12/2018    03/16/2018 Procedure    Ultrasound and fluoroscopically guided right internal jugular single lumen power port catheter insertion. Tip in the SVC/RA junction. Catheter ready for use.    03/20/2018 -  Chemotherapy    The patient had carboplatin  and taxol     REVIEW OF SYSTEMS:   Constitutional: Denies fevers, chills or abnormal weight loss Eyes: Denies blurriness of vision Ears, nose, mouth, throat, and face: Denies mucositis or sore throat Respiratory: Denies cough, dyspnea or wheezes Cardiovascular: Denies palpitation, chest discomfort or lower extremity swelling Skin: Denies abnormal skin rashes Lymphatics: Denies new lymphadenopathy or easy bruising Neurological:Denies numbness, tingling or new weaknesses Behavioral/Psych: Mood is stable, no new changes  All other systems were reviewed with the patient and are negative.  I have reviewed the past medical history, past surgical history, social history and family history with the patient and they are unchanged from previous note.  ALLERGIES:  is allergic to adhesive [tape]; banana; eggs or egg-derived products; latex; penicillins; pine; and rose.  MEDICATIONS:  Current Outpatient Medications  Medication Sig Dispense Refill  . albuterol (PROVENTIL) (2.5 MG/3ML) 0.083% nebulizer solution Take 3 mLs (2.5 mg total) by nebulization every 4 (four) hours as needed for wheezing or shortness of breath (Dx: J45.50). 75 mL 1  . clobetasol ointment (TEMOVATE) 0.05 % APPLY TO AFFECTED AREA TWICE A DAY (Patient taking differently: Apply 1 application topically 2 (two) times daily as needed (for irritation). ) 60 g 1  . dexamethasone (DECADRON) 4 MG tablet Take 2 tabs at the night before and 2 tab the morning of chemotherapy, every 3 weeks, by mouth 24 tablet 0  . fluticasone (CUTIVATE) 0.05 % cream APPLY TO AFFECTED AREA OF R LOWER LEG TWICE PER DAY 30 g 0  . fluticasone (FLONASE SENSIMIST) 27.5 MCG/SPRAY nasal spray Place 1 spray into the nose daily as needed for rhinitis.    . furosemide (LASIX)  20 MG tablet TAKE 1 TABLET BY MOUTH EVERY DAY (Patient taking differently: Take 20 mg by mouth daily. ) 90 tablet 1  . HYDROcodone-acetaminophen (NORCO/VICODIN) 5-325 MG tablet 1-2 tabs po qd prn  pain 60 tablet 0  . insulin aspart (NOVOLOG) 100 UNIT/ML injection USE PER SLIDING SCALE AT EACH MEAL (AVG USE IS 17 UNITS THREE TIMES DAILY) 50 mL 2  . insulin NPH Human (NOVOLIN N) 100 UNIT/ML injection 65 U SQ qAM, 65 U SQ mid day, and 61 U SQ q evening (Patient taking differently: Inject 60 Units into the skin 3 (three) times daily. ) 50 mL 5  . Insulin Syringes, Disposable, U-100 0.3 ML MISC Use to inject insulin--6 injections per day 540 each 3  . levocetirizine (XYZAL) 5 MG tablet Take 5 mg by mouth at bedtime as needed for allergies.   5  . levothyroxine (SYNTHROID, LEVOTHROID) 125 MCG tablet TAKE 1 TABLET BY MOUTH EVERY MORNING ON AN EMPTY STOMACH 90 tablet 0  . lidocaine-prilocaine (EMLA) cream Apply to affected area once 30 g 3  . meloxicam (MOBIC) 15 MG tablet TAKE 1 TABLET BY MOUTH EVERY DAY AS NEEDED (Patient taking differently: Take 15 mg by mouth daily as needed for pain. ) 30 tablet 3  . metFORMIN (GLUCOPHAGE) 500 MG tablet TAKE 1 TABLET BY MOUTH EVERY MORNING AND 2 TABLETS BY MOUTH AT BEDTIME 270 tablet 1  . miconazole (MICRO GUARD) 2 % powder Apply 1 application topically 2 (two) times daily as needed for itching.    . montelukast (SINGULAIR) 10 MG tablet Take 10 mg by mouth every evening.     . ondansetron (ZOFRAN) 8 MG tablet Take 1 tablet (8 mg total) by mouth every 8 (eight) hours as needed for refractory nausea / vomiting. Start on day 3 after chemo. 30 tablet 1  . pravastatin (PRAVACHOL) 20 MG tablet TAKE 1 TABLET BY MOUTH EVERY DAY 90 tablet 1  . Probiotic Product (PROBIOTIC PO) Take 1 capsule by mouth daily.    . prochlorperazine (COMPAZINE) 10 MG tablet Take 1 tablet (10 mg total) by mouth every 6 (six) hours as needed (Nausea or vomiting). 30 tablet 1  . senna-docusate (SENOKOT-S) 8.6-50 MG tablet Take 2 tablets by mouth at bedtime. Hold if having loose stools 30 tablet 1  . sodium polystyrene (KAYEXALATE) 15 GM/60ML suspension Take 60 mLs (15 g total) by mouth once for 1  dose. 60 mL 0  . SYMBICORT 160-4.5 MCG/ACT inhaler Inhale 2 puffs into the lungs 2 (two) times daily.    . VENTOLIN HFA 108 (90 Base) MCG/ACT inhaler INHALE 2 PUFFS INTO LUNGS EVERY 6 HOURS AS NEEDED FOR WHEEZING/SHORTNESS OF BREATH 18 Inhaler 0   No current facility-administered medications for this visit.    Facility-Administered Medications Ordered in Other Visits  Medication Dose Route Frequency Provider Last Rate Last Dose  . CARBOplatin (PARAPLATIN) 750 mg in sodium chloride 0.9 % 250 mL chemo infusion  750 mg Intravenous Once Alvy Bimler, Alexzandra Bilton, MD      . diphenhydrAMINE (BENADRYL) injection 50 mg  50 mg Intravenous Once Alvy Bimler, Maddalena Linarez, MD      . famotidine (PEPCID) IVPB 20 mg premix  20 mg Intravenous Once Alvy Bimler, Celene Pippins, MD      . fosaprepitant (EMEND) 150 mg, dexamethasone (DECADRON) 6 mg in sodium chloride 0.9 % 145 mL IVPB   Intravenous Once Bridgid Printz, MD      . heparin lock flush 100 unit/mL  500 Units Intracatheter Once PRN Alvy Bimler, Dexton Zwilling,  MD      . insulin regular (NOVOLIN R,HUMULIN R) 100 units/mL injection 15 Units  15 Units Subcutaneous Once Alvy Bimler, Leary Mcnulty, MD      . PACLitaxel (TAXOL) 330 mg in sodium chloride 0.9 % 500 mL chemo infusion (> 61m/m2)  140 mg/m2 (Treatment Plan Recorded) Intravenous Once GAlvy Bimler Tauren Delbuono, MD      . palonosetron (ALOXI) injection 0.25 mg  0.25 mg Intravenous Once Darris Staiger, MD      . sodium chloride flush (NS) 0.9 % injection 10 mL  10 mL Intracatheter PRN GAlvy Bimler Jaysean Manville, MD        PHYSICAL EXAMINATION: ECOG PERFORMANCE STATUS: 2 - Symptomatic, <50% confined to bed  Vitals:   04/30/18 0951  BP: (!) 168/53  Pulse: 90  Resp: 18  Temp: 97.8 F (36.6 C)  SpO2: 98%   Filed Weights   04/30/18 0951  Weight: 281 lb 12.8 oz (127.8 kg)    GENERAL:alert, no distress and comfortable SKIN: skin color, texture, turgor are normal, no rashes or significant lesions EYES: normal, Conjunctiva are pink and non-injected, sclera clear OROPHARYNX:no exudate, no erythema and  lips, buccal mucosa, and tongue normal  NECK: supple, thyroid normal size, non-tender, without nodularity LYMPH:  no palpable lymphadenopathy in the cervical, axillary or inguinal LUNGS: clear to auscultation and percussion with normal breathing effort HEART: regular rate & rhythm and no murmurs with mild  lower extremity edema ABDOMEN:abdomen soft, non-tender and normal bowel sounds Musculoskeletal:no cyanosis of digits and no clubbing  NEURO: alert & oriented x 3 with fluent speech, no focal motor/sensory deficits  LABORATORY DATA:  I have reviewed the data as listed    Component Value Date/Time   NA 140 04/30/2018 0840   K 5.7 (H) 04/30/2018 0840   CL 106 04/30/2018 0840   CO2 21 (L) 04/30/2018 0840   GLUCOSE 338 (H) 04/30/2018 0840   BUN 29 (H) 04/30/2018 0840   CREATININE 1.08 (H) 04/30/2018 0840   CALCIUM 9.2 04/30/2018 0840   PROT 7.6 04/30/2018 0840   ALBUMIN 3.4 (L) 04/30/2018 0840   AST 23 04/30/2018 0840   ALT 28 04/30/2018 0840   ALKPHOS 82 04/30/2018 0840   BILITOT 0.3 04/30/2018 0840   GFRNONAA 53 (L) 04/30/2018 0840   GFRAA >60 04/30/2018 0840    No results found for: SPEP, UPEP  Lab Results  Component Value Date   WBC 6.8 04/30/2018   NEUTROABS 5.8 04/30/2018   HGB 9.7 (L) 04/30/2018   HCT 29.9 (L) 04/30/2018   MCV 94.9 04/30/2018   PLT 109 (L) 04/30/2018      Chemistry      Component Value Date/Time   NA 140 04/30/2018 0840   K 5.7 (H) 04/30/2018 0840   CL 106 04/30/2018 0840   CO2 21 (L) 04/30/2018 0840   BUN 29 (H) 04/30/2018 0840   CREATININE 1.08 (H) 04/30/2018 0840      Component Value Date/Time   CALCIUM 9.2 04/30/2018 0840   ALKPHOS 82 04/30/2018 0840   AST 23 04/30/2018 0840   ALT 28 04/30/2018 0840   BILITOT 0.3 04/30/2018 0840       All questions were answered. The patient knows to call the clinic with any problems, questions or concerns. No barriers to learning was detected.  I spent 25 minutes counseling the patient face to  face. The total time spent in the appointment was 30 minutes and more than 50% was on counseling and review of test results  NHeath Lark MD  04/30/2018 10:48 AM

## 2018-05-01 ENCOUNTER — Telehealth: Payer: Self-pay

## 2018-05-01 ENCOUNTER — Encounter: Payer: Self-pay | Admitting: Family Medicine

## 2018-05-01 ENCOUNTER — Other Ambulatory Visit: Payer: Self-pay | Admitting: Family Medicine

## 2018-05-01 ENCOUNTER — Ambulatory Visit: Payer: Medicare Other | Admitting: Radiation Oncology

## 2018-05-01 ENCOUNTER — Other Ambulatory Visit: Payer: Self-pay

## 2018-05-01 MED ORDER — FELODIPINE ER 10 MG PO TB24: 10.0000 mg | ORAL_TABLET | Freq: Every day | ORAL | 1 refills | Status: DC

## 2018-05-01 MED FILL — SPS 15 GM/60 ML SUSPENSION: 15 | 1 days supply | Qty: 60 | Fill #0

## 2018-05-01 NOTE — Telephone Encounter (Signed)
Called regarding after hours call. [facility] called after hours number and spoke to on call MD, they do not have Kayexalate Rx.  [facility] outpatient pharmacy and talked with pharmacist. Rx transferred to [facility] outpatient pharmacy. [name] will pick up Rx later today.  She questioned if she should continue Mobic Rx. Per Dr. Alvy Bimler okay to continue Mobic as ordered. She verbalized understanding.

## 2018-05-01 NOTE — Telephone Encounter (Signed)
Patient aware of new BP medication.  Patient also wanted to give Glucose readings during steroids. 1.20.2020 am - before breakfast 301  Before lunch-253    Before dinner 300 Before Bed 354 1.21.2020 am- before breakfast 372   Before lunch- 235  Patient states that she is doing low carbs when she is taking the steroids.

## 2018-05-01 NOTE — Telephone Encounter (Signed)
OK to stop enalapril. I will eRx a bp med to replace it (this one does not carry the risk of elevating potassium level)--felodipine.

## 2018-05-01 NOTE — Telephone Encounter (Signed)
Please advise. Thanks.  

## 2018-05-01 NOTE — Telephone Encounter (Signed)
Increase Humulin N to 70 qBF, 70 q mid day, and 68 q evening.

## 2018-05-01 NOTE — Telephone Encounter (Signed)
See BS readings. Please advise. Thanks.

## 2018-05-01 NOTE — Telephone Encounter (Signed)
Pt advised and voiced understanding.    She also wanted to let Dr. Anitra Lauth know that her oncologist is cutting her steroid dose in half. She will now only be taking it the night before her chemo. (She was taking it the night before the the morning of.)

## 2018-05-02 ENCOUNTER — Telehealth: Payer: Self-pay | Admitting: *Deleted

## 2018-05-02 NOTE — Telephone Encounter (Signed)
Noted. Thanks.

## 2018-05-02 NOTE — Telephone Encounter (Signed)
CALLED PATIENT TO REMIND OF [facility] TX. FOR 05-03-18 @ 8 AM, LVM FOR A RETURN CALL

## 2018-05-03 ENCOUNTER — Ambulatory Visit
Admission: RE | Admit: 2018-05-03 | Discharge: 2018-05-03 | Disposition: A | Discharge status: Home / Self Care | Payer: Medicare Other | Source: Ambulatory Visit | Attending: Radiation Oncology | Admitting: Radiation Oncology

## 2018-05-03 DIAGNOSIS — C541 Malignant neoplasm of endometrium: Secondary | ICD-10-CM

## 2018-05-03 NOTE — Progress Notes (Signed)
  Radiation Oncology         [phone] ________________________________  Name: [name] MRN: [id number]  Date: 05/03/2018  DOB: 03/17/1951  CC: McGowen, Adrian Blackwater, MD  Everitt Amber, MD  HDR BRACHYTHERAPY NOTE  DIAGNOSIS: 68 y.o. female with Stage IIIA endometroid endometrial carcinoma   Simple treatment device note: Patient had construction of her custom vaginal cylinder. She will be treated with a 2.5 cm diameter segmented cylinder. This conforms to her anatomy without undue discomfort.  Vaginal brachytherapy procedure node: The patient was brought to the [place] suite. Identity was confirmed. All relevant records and images related to the planned course of therapy were reviewed. The patient freely provided informed written consent to proceed with treatment after reviewing the details related to the planned course of therapy. The consent form was witnessed and verified by the simulation staff. Then, the patient was set-up in a stable reproducible supine position for radiation therapy. Pelvic exam revealed the vaginal cuff to be intact. The patient's custom vaginal cylinder was placed in the proximal vagina. This was affixed to the CT/MR stabilization plate to prevent slippage. Patient tolerated the placement well.  Verification simulation note:  A fiducial marker was placed within the vaginal cylinder. An AP and lateral film was then obtained through the pelvis area. This documented accurate position of the vaginal cylinder for treatment.  HDR BRACHYTHERAPY TREATMENT  The remote afterloading device was affixed to the vaginal cylinder by catheter. Patient then proceeded to undergo her second high-dose-rate treatment directed at the proximal vagina. The patient was prescribed a dose of 6 gray to be delivered to the mucosal surface. Treatment length was 3 cm. Patient was treated with 1 channel using 7 dwell positions. Treatment time was 158.70 seconds. Iridium 192 was the high-dose-rate source  for treatment. The patient tolerated the treatment well. After completion of her therapy, a radiation survey was performed documenting return of the iridium source into the GammaMed safe.   PLAN: The patient will return on 05/07/18 for her third HDR treatment. ________________________________  Blair Promise, PhD, MD   This document serves as a record of services personally performed by Gery Pray, MD. It was created on his behalf by Rae Lips, a trained medical scribe. The creation of this record is based on the scribe's personal observations and the provider's statements to them. This document has been checked and approved by the attending provider.

## 2018-05-04 ENCOUNTER — Telehealth: Payer: Self-pay | Admitting: *Deleted

## 2018-05-04 NOTE — Telephone Encounter (Signed)
Called patient to remind of [facility]DR Tx. for 05-07-18 @ 9 am, spoke with patient and she is aware of this tx.

## 2018-05-07 ENCOUNTER — Ambulatory Visit
Admission: RE | Admit: 2018-05-07 | Discharge: 2018-05-07 | Disposition: A | Discharge status: Home / Self Care | Payer: Medicare Other | Source: Ambulatory Visit | Attending: Radiation Oncology | Admitting: Radiation Oncology

## 2018-05-07 DIAGNOSIS — C541 Malignant neoplasm of endometrium: Secondary | ICD-10-CM

## 2018-05-07 NOTE — Progress Notes (Signed)
  Radiation Oncology         [phone] ________________________________  Name: [name] MRN: [id number]  Date: 05/07/2018  DOB: 1950-11-03  CC: McGowen, Adrian Blackwater, MD  Everitt Amber, MD  HDR BRACHYTHERAPY NOTE  DIAGNOSIS: 68 y.o. female with Stage IIIA endometroid endometrial carcinoma   Simple treatment device note: Patient had construction of her custom vaginal cylinder. She will be treated with a 2.5 cm diameter segmented cylinder. This conforms to her anatomy without undue discomfort.  Vaginal brachytherapy procedure node: The patient was brought to the [place] suite. Identity was confirmed. All relevant records and images related to the planned course of therapy were reviewed. The patient freely provided informed written consent to proceed with treatment after reviewing the details related to the planned course of therapy. The consent form was witnessed and verified by the simulation staff. Then, the patient was set-up in a stable reproducible supine position for radiation therapy. Pelvic exam revealed the vaginal cuff to be intact. The patient's custom vaginal cylinder was placed in the proximal vagina. This was affixed to the CT/MR stabilization plate to prevent slippage. Patient tolerated the placement well.  Verification simulation note:  A fiducial marker was placed within the vaginal cylinder. An AP and lateral film was then obtained through the pelvis area. This documented accurate position of the vaginal cylinder for treatment.  HDR BRACHYTHERAPY TREATMENT  The remote afterloading device was affixed to the vaginal cylinder by catheter. Patient then proceeded to undergo her third high-dose-rate treatment directed at the proximal vagina. The patient was prescribed a dose of 6 gray to be delivered to the mucosal surface. Treatment length was 3 cm. Patient was treated with 1 channel using 7 dwell positions. Treatment time was 164.80 seconds. Iridium 192 was the high-dose-rate source  for treatment. The patient tolerated the treatment well. After completion of her therapy, a radiation survey was performed documenting return of the iridium source into the GammaMed safe.   PLAN: The patient will return on 05/16/18 for her fourth HDR treatment. ________________________________  Blair Promise, PhD, MD   This document serves as a record of services personally performed by Gery Pray, MD. It was created on his behalf by Mary-Margaret Loma Messing, a trained medical scribe. The creation of this record is based on the scribe's personal observations and the provider's statements to them. This document has been checked and approved by the attending provider.

## 2018-05-08 ENCOUNTER — Ambulatory Visit: Payer: Medicare Other | Admitting: Radiation Oncology

## 2018-05-15 ENCOUNTER — Telehealth: Payer: Self-pay | Admitting: *Deleted

## 2018-05-15 ENCOUNTER — Ambulatory Visit: Payer: Medicare Other | Admitting: Radiation Oncology

## 2018-05-15 NOTE — Telephone Encounter (Signed)
CALLED PATIENT TO REMIND OF [facility]DR [place] 05-16-18 @ 9 AM, SPOKE WITH PATIENT AND SHE IS AWARE OF THIS APPT.

## 2018-05-16 ENCOUNTER — Ambulatory Visit
Admission: RE | Admit: 2018-05-16 | Discharge: 2018-05-16 | Disposition: A | Discharge status: Home / Self Care | Payer: Medicare Other | Source: Ambulatory Visit | Attending: Radiation Oncology | Admitting: Radiation Oncology

## 2018-05-16 DIAGNOSIS — C541 Malignant neoplasm of endometrium: Secondary | ICD-10-CM | POA: Diagnosis not present

## 2018-05-16 NOTE — Progress Notes (Signed)
  Radiation Oncology         [phone] ________________________________  Name: [name] MRN: [id number]  Date: 05/16/2018  DOB: [birth year]  CC: McGowen, Adrian Blackwater, MD  Everitt Amber, MD  HDR BRACHYTHERAPY NOTE  DIAGNOSIS: 68 y.o. female with Stage IIIA endometroid endometrial carcinoma   Simple treatment device note: Patient had construction of her custom vaginal cylinder. She will be treated with a 2.5 cm diameter segmented cylinder. This conforms to her anatomy without undue discomfort.  Vaginal brachytherapy procedure node: The patient was brought to the [place] suite. Identity was confirmed. All relevant records and images related to the planned course of therapy were reviewed. The patient freely provided informed written consent to proceed with treatment after reviewing the details related to the planned course of therapy. The consent form was witnessed and verified by the simulation staff. Then, the patient was set-up in a stable reproducible supine position for radiation therapy. Pelvic exam revealed the vaginal cuff to be intact. The patient's custom vaginal cylinder was placed in the proximal vagina. This was affixed to the CT/MR stabilization plate to prevent slippage. Patient tolerated the placement well.  Verification simulation note:  A fiducial marker was placed within the vaginal cylinder. An AP and lateral film was then obtained through the pelvis area. This documented accurate position of the vaginal cylinder for treatment.  HDR BRACHYTHERAPY TREATMENT  The remote afterloading device was affixed to the vaginal cylinder by catheter. Patient then proceeded to undergo her fourth high-dose-rate treatment directed at the proximal vagina. The patient was prescribed a dose of 6 gray to be delivered to the mucosal surface. Treatment length was 3 cm. Patient was treated with 1 channel using 7 dwell positions. Treatment time was 179.3 seconds. Iridium 192 was the high-dose-rate source  for treatment. The patient tolerated the treatment well. After completion of her therapy, a radiation survey was performed documenting return of the iridium source into the GammaMed safe.   PLAN: The patient will return on 05/23/18 for her fifth HDR treatment. ________________________________  Blair Promise, PhD, MD   This document serves as a record of services personally performed by Gery Pray, MD. It was created on his behalf by Mary-Margaret Loma Messing, a trained medical scribe. The creation of this record is based on the scribe's personal observations and the provider's statements to them. This document has been checked and approved by the attending provider.

## 2018-05-18 ENCOUNTER — Ambulatory Visit (INDEPENDENT_AMBULATORY_CARE_PROVIDER_SITE_OTHER): Payer: Medicare Other | Admitting: Family Medicine

## 2018-05-18 ENCOUNTER — Telehealth: Payer: Self-pay | Admitting: *Deleted

## 2018-05-18 ENCOUNTER — Encounter: Payer: Self-pay | Admitting: Family Medicine

## 2018-05-18 VITALS — BP 140/78 | HR 99 | Temp 97.6°F | Resp 16 | Ht 61.0 in | Wt 281.0 lb

## 2018-05-18 DIAGNOSIS — T451X5A Adverse effect of antineoplastic and immunosuppressive drugs, initial encounter: Secondary | ICD-10-CM | POA: Diagnosis not present

## 2018-05-18 DIAGNOSIS — E875 Hyperkalemia: Secondary | ICD-10-CM

## 2018-05-18 DIAGNOSIS — E119 Type 2 diabetes mellitus without complications: Secondary | ICD-10-CM | POA: Diagnosis not present

## 2018-05-18 DIAGNOSIS — I10 Essential (primary) hypertension: Secondary | ICD-10-CM

## 2018-05-18 LAB — POCT GLYCOSYLATED HEMOGLOBIN (HGB A1C): Hemoglobin A1C: 7 % — AB (ref 4.0–5.6)

## 2018-05-18 MED ORDER — HYDROCODONE-ACETAMINOPHEN 5-325 MG PO TABS: ORAL_TABLET | ORAL | 0 refills | Status: DC

## 2018-05-18 NOTE — Telephone Encounter (Signed)
CALLED PATIENT TO REMIND OF [facility]DR [place] 05-21-18 @ 8 AM, SPOKE WITH PATIENT AND SHE IS AWARE OF THIS [place].

## 2018-05-18 NOTE — Progress Notes (Signed)
OFFICE VISIT  05/18/2018   CC:  Chief Complaint  Patient presents with  . Follow-up    F/U [facility], needs pain meds refills. Pt has not taken all her medications this AM   HPI:    Patient is a 68 y.o. Caucasian female who presents for 2 mo f/u DM 2 (but due for HbA1c). She has been getting adjuvant chemo for endometrial cancer--> glucoses quite high-->esp with pretreatment dexamethazone. We've made a few insulin dosing adjustments since last o/v. Most recent recommendation by me: Increase Humulin N to 70 qBF, 70 q mid day, and 68 q evening. She has cut back to almost no carbs on chemo days.  Taking the 70 - 70- 68 on chemo day and the day after. On other days she cuts back to 65-65-65 of the N. Typically gives avg of 12-15 U mealtime insulin except on the chemo day and next day--takes up to 25 U mealtime insulin. Glucoses still 300s on chemo day and day after.  The other days it is back down to 80-85 U. She will be taking the steroid ONLY the night before chemo this next time (this Monday) to see if glucose rise is any less.  She is doing pretty well emotionally/mentally with her chemo treatments.  Also, radiation tx end with next treatment in 3d.  I also changed her from ACE-I to felodipine due to hyperkalemia. Home monitoring since the change have shown avg 130/80.  She always has elevated bp in MD offices. No side effect noted from felodipine.  Asthma has been better controlled since getting fairly regular doses of dexamethazone.  Chemo treatments make her very fatigued and cause diffuse body pain, even worse in the areas where she has bad arthritis.  She takes avg of a couple vicodin per day for about a two week period after each chemo tx.  Past Medical History:  Diagnosis Date  . Allergic rhinitis    Allergy testing: results pending as of 05/28/16 (Dr. Harold Hedge)  . Anemia   . Arthritis   . Chemotherapy-induced neuropathy ([place]) 2019  . Complex endometrial hyperplasia with  atypia 12/12   Most recent endometrial bx was 10/2012-NEG  . Cough variant asthma   . DDD (degenerative disc disease), lumbar   . Diabetes mellitus with complication ([place])    Mild nonprolif DR R eye 12/2017-->referred to retinal specialist  . Encounter for insertion of mirena IUD 5/12  . Endometrial cancer ([place]) 02/2018   Stage III (T3, Nx, Mx)  Path: endometroid adenocarcinoma involving cervix, uterus, and fallopian tubes (Pt got TAH & BSO 02/2018).  . Family history of adverse reaction to anesthesia    brother had severe hypotension  . Fracture, foot 8/14   Hx of left foot stress fracture  . GERD (gastroesophageal reflux disease)   . Hemorrhoids   . Herpes zoster 10/2015   L side belt-line  . History of adenomatous polyp of colon 02/25/13   5 mm cecal polyp removed by Dr. Trevor Mace 5 yrs  . History of blood transfusion   . History of cellulitis 08/2010   Left (Since replacemnt of Left knee  . History of recent dental procedure 02/2018  . Hyperkalemia 03/2018   started after pt started getting chemo-->had to stop enalapril.  Marland Kitchen Hyperlipidemia    Not on statin b/c lipids "stayed down when sugars came down" per pt.  She says Dr. Chalmers Cater knows she is not on statin anymore.  . Hypertension   . Hypothyroidism   .  Nephrolithiasis 4/07  . Obesity   . OSA on CPAP   . Osteoarthritis    knees, ankle, + ? scapholunate ligament disruption (x-ray 06/2017)--ortho referral.  . Sciatica of left side   . Severe persistent asthma    cough-variant--saw Allergist 05/25/16 and was switched from max dose advair to symbicort.  . Systolic murmur    ECHO fine 10/2016-->murmur likely flow murmur assoc with HTN.  Marland Kitchen UTI (lower urinary tract infection)   . Zoon's vulvitis    Bx-proven (GYN) -lichen sclerosis.  Clobetasol 0.05% ointment per GYN    Past Surgical History:  Procedure Laterality Date  . ANKLE FRACTURE SURGERY  1984   Pin & repair  . ARTHROSCOPIC REPAIR ACL  2000   Due to ACL tear  .  ARTHROSCOPIC REPAIR ACL  5/08  . Blateral knee rerlacements x4 2 times each knee    . BREAST BIOPSY Right 2014   Benign  . CARDIAC CATHETERIZATION  5/07   clear vessel mild mitral   . CARPAL TUNNEL RELEASE Right [id number]   1989 Left  . CESAREAN SECTION  1985  . CHOLECYSTECTOMY OPEN  1988  . COLONOSCOPY N/A 02/25/2013   Tubular adenoma x 1: Recall 5 yrs. Procedure: COLONOSCOPY;  Surgeon: Juanita Craver, MD;  Location: [facility];  Service: Endoscopy;  Laterality: N/A;  . COMBINED HYSTEROSCOPY DIAGNOSTIC / D&C  2/12   Bx neg  . DEXA  08/2007   Bone density normal.  . DILATION AND CURETTAGE OF UTERUS  12/11  . IR IMAGING GUIDED PORT INSERTION  03/16/2018  . LYMPH NODE BIOPSY N/A 02/19/2018   Procedure: Sentinel LYMPH NODE BIOPSY;  Surgeon: Everitt Amber, MD;  Location: [facility];  Service: Gynecology;  Laterality: N/A;  . REPLACEMENT TOTAL KNEE Left 5/12   X 2 on each  . ROBOTIC ASSISTED TOTAL HYSTERECTOMY WITH BILATERAL SALPINGO OOPHERECTOMY N/A 02/19/2018   For endometrial cancer.  Procedure: XI ROBOTIC ASSISTED TOTAL HYSTERECTOMY WITH BILATERAL SALPINGO OOPHORECTOMY;  Surgeon: Everitt Amber, MD;  Location: [facility];  Service: Gynecology;  Laterality: N/A;  . TRANSTHORACIC ECHOCARDIOGRAM  10/11/2016    EF 55-60%, grd I DD.  . TUBAL LIGATION  1985   C-Section    Outpatient Medications Prior to Visit  Medication Sig Dispense Refill  . albuterol (PROVENTIL) (2.5 MG/3ML) 0.083% nebulizer solution Take 3 mLs (2.5 mg total) by nebulization every 4 (four) hours as needed for wheezing or shortness of breath (Dx: J45.50). 75 mL 1  . clobetasol ointment (TEMOVATE) 0.05 % APPLY TO AFFECTED AREA TWICE A DAY (Patient taking differently: Apply 1 application topically 2 (two) times daily as needed (for irritation). ) 60 g 1  . dexamethasone (DECADRON) 4 MG tablet Take 2 tabs at the night before and 2 tab the morning of chemotherapy, every 3 weeks, by mouth 24 tablet 0   . felodipine (PLENDIL) 10 MG 24 hr tablet Take 1 tablet (10 mg total) by mouth daily. 30 tablet 1  . fluticasone (CUTIVATE) 0.05 % cream APPLY TO AFFECTED AREA OF R LOWER LEG TWICE PER DAY 30 g 0  . fluticasone (FLONASE SENSIMIST) 27.5 MCG/SPRAY nasal spray Place 1 spray into the nose daily as needed for rhinitis.    . furosemide (LASIX) 20 MG tablet TAKE 1 TABLET BY MOUTH EVERY DAY (Patient taking differently: Take 20 mg by mouth daily. ) 90 tablet 1  . insulin aspart (NOVOLOG) 100 UNIT/ML injection USE PER SLIDING SCALE AT [facility] MEAL (AVG USE IS 17  UNITS THREE TIMES DAILY) 50 mL 2  . insulin NPH Human (NOVOLIN N) 100 UNIT/ML injection 65 U SQ qAM, 65 U SQ mid day, and 61 U SQ q evening (Patient taking differently: Inject 60 Units into the skin 3 (three) times daily. ) 50 mL 5  . Insulin Syringes, Disposable, U-100 0.3 ML MISC Use to inject insulin--6 injections per day 540 each 3  . levocetirizine (XYZAL) 5 MG tablet Take 5 mg by mouth at bedtime as needed for allergies.   5  . levothyroxine (SYNTHROID, LEVOTHROID) 125 MCG tablet TAKE 1 TABLET BY MOUTH EVERY MORNING ON AN EMPTY STOMACH 90 tablet 0  . lidocaine-prilocaine (EMLA) cream Apply to affected area once 30 g 3  . meloxicam (MOBIC) 15 MG tablet TAKE 1 TABLET BY MOUTH EVERY DAY AS NEEDED (Patient taking differently: Take 15 mg by mouth daily as needed for pain. ) 30 tablet 3  . metFORMIN (GLUCOPHAGE) 500 MG tablet TAKE 1 TABLET BY MOUTH EVERY MORNING AND 2 TABLETS BY MOUTH AT BEDTIME 270 tablet 1  . miconazole (MICRO GUARD) 2 % powder Apply 1 application topically 2 (two) times daily as needed for itching.    . montelukast (SINGULAIR) 10 MG tablet Take 10 mg by mouth every evening.     . ondansetron (ZOFRAN) 8 MG tablet Take 1 tablet (8 mg total) by mouth every 8 (eight) hours as needed for refractory nausea / vomiting. Start on day 3 after chemo. 30 tablet 1  . pravastatin (PRAVACHOL) 20 MG tablet TAKE 1 TABLET BY MOUTH EVERY DAY 90 tablet 1   . Probiotic Product (PROBIOTIC PO) Take 1 capsule by mouth daily.    . prochlorperazine (COMPAZINE) 10 MG tablet Take 1 tablet (10 mg total) by mouth every 6 (six) hours as needed (Nausea or vomiting). 30 tablet 1  . senna-docusate (SENOKOT-S) 8.6-50 MG tablet Take 2 tablets by mouth at bedtime. Hold if having loose stools 30 tablet 1  . SYMBICORT 160-4.5 MCG/ACT inhaler Inhale 2 puffs into the lungs 2 (two) times daily.    . VENTOLIN HFA 108 (90 Base) MCG/ACT inhaler INHALE 2 PUFFS INTO LUNGS EVERY 6 HOURS AS NEEDED FOR WHEEZING/SHORTNESS OF BREATH 18 Inhaler 0  . HYDROcodone-acetaminophen (NORCO/VICODIN) 5-325 MG tablet 1-2 tabs po qd prn pain 60 tablet 0   No facility-administered medications prior to visit.     Allergies  Allergen Reactions  . Adhesive [Tape] Other (See Comments)    Takes patient's skin off.  . Banana Diarrhea and Nausea Only  . Eggs Or Egg-Derived Products Diarrhea and Nausea Only  . Latex Other (See Comments)    sensativity  . Penicillins Rash and Other (See Comments)    Has had cephalosporins without incident  . Pine Swelling and Rash  . Rose Swelling and Rash    ROS As per HPI  PE: Blood pressure 140/78, pulse 99, temperature 97.6 F (36.4 C), temperature source Oral, resp. rate 16, height 5\' 1"  (1.549 m), weight 281 lb (127.5 kg), last menstrual period 08/10/2010, SpO2 98 %. Gen: Alert, well appearing.  Patient is oriented to person, place, time, and situation. AFFECT: pleasant, lucid thought and speech. CV: RRR, 2/6 systolic murmur, no diastolic murmur, no r/g.   LUNGS: CTA bilat, nonlabored resps, good aeration in all lung fields.   LABS:    Chemistry      Component Value Date/Time   NA 140 04/30/2018 0840   K 5.7 (H) 04/30/2018 0840   CL 106 04/30/2018 0840  CO2 21 (L) 04/30/2018 0840   BUN 29 (H) 04/30/2018 0840   CREATININE 1.08 (H) 04/30/2018 0840      Component Value Date/Time   CALCIUM 9.2 04/30/2018 0840   ALKPHOS 82 04/30/2018  0840   AST 23 04/30/2018 0840   ALT 28 04/30/2018 0840   BILITOT 0.3 04/30/2018 0840     Lab Results  Component Value Date   HGBA1C 7.0 (A) 05/18/2018   Lab Results  Component Value Date   WBC 6.8 04/30/2018   HGB 9.7 (L) 04/30/2018   HCT 29.9 (L) 04/30/2018   MCV 94.9 04/30/2018   PLT 109 (L) 04/30/2018   POC A1c test today was 7.0 %  IMPRESSION AND PLAN:  1) DM 2, control has been understandably erratic since getting on chemotherapy and getting pretreatment dexamethasone. POC HbA1c today was 7.0 %, which is great.  Adjusting for her anemia lately, I am just estimating that her actual A1c would be around 8%. I would continue with what she is currently doing with her insulin dosing, esp since she'll be getting only 50% of the usual dexamethasone with next chemo and potentially the 2 after that (she has 3 more treatments left).  2) HTN: control has been good since recent switchover from ACE-I to felodipine. She'll be gettng recheck of potassium in 3d--chemotherapy pretreatment labs.  3) Endometrial cancer: she is s/p hysterectomy with BSO on 02/19/18. She is doing pretty well tolerating these: she does use avg of 2 vicodin per day for about a two week period after each treatment.  I sent in rx for vicodin 5/325, 1-2 tabs qd prn, #60.  If she needs more in the near future she is to call and tell our office and I'll eRx more.  She has plans for repeat blood work in 3 d at the time of next chemo treatment.  An After Visit Summary was printed and given to the patient.  FOLLOW UP: Return in about 3 months (around 08/16/2018) for routine chronic illness f/u (30 min).  Signed:  Crissie Sickles, MD           05/18/2018

## 2018-05-21 ENCOUNTER — Inpatient Hospital Stay: Payer: Medicare Other | Attending: Obstetrics

## 2018-05-21 ENCOUNTER — Inpatient Hospital Stay: Payer: Medicare Other

## 2018-05-21 ENCOUNTER — Ambulatory Visit
Admission: RE | Admit: 2018-05-21 | Discharge: 2018-05-21 | Disposition: A | Discharge status: Home / Self Care | Payer: Medicare Other | Source: Ambulatory Visit | Attending: Radiation Oncology | Admitting: Radiation Oncology

## 2018-05-21 ENCOUNTER — Encounter: Payer: Self-pay | Admitting: Radiation Oncology

## 2018-05-21 ENCOUNTER — Inpatient Hospital Stay (HOSPITAL_BASED_OUTPATIENT_CLINIC_OR_DEPARTMENT_OTHER): Payer: Medicare Other | Admitting: Hematology and Oncology

## 2018-05-21 ENCOUNTER — Telehealth: Payer: Self-pay | Admitting: Hematology and Oncology

## 2018-05-21 ENCOUNTER — Encounter: Payer: Self-pay | Admitting: Hematology and Oncology

## 2018-05-21 VITALS — BP 148/46 | HR 99 | Temp 97.8°F | Resp 18 | Ht 61.0 in | Wt 286.2 lb

## 2018-05-21 DIAGNOSIS — C541 Malignant neoplasm of endometrium: Secondary | ICD-10-CM | POA: Diagnosis not present

## 2018-05-21 DIAGNOSIS — E11319 Type 2 diabetes mellitus with unspecified diabetic retinopathy without macular edema: Secondary | ICD-10-CM

## 2018-05-21 DIAGNOSIS — Z791 Long term (current) use of non-steroidal anti-inflammatories (NSAID): Secondary | ICD-10-CM | POA: Diagnosis not present

## 2018-05-21 DIAGNOSIS — T451X5A Adverse effect of antineoplastic and immunosuppressive drugs, initial encounter: Secondary | ICD-10-CM

## 2018-05-21 DIAGNOSIS — G620 Drug-induced polyneuropathy: Secondary | ICD-10-CM

## 2018-05-21 DIAGNOSIS — E1165 Type 2 diabetes mellitus with hyperglycemia: Secondary | ICD-10-CM | POA: Insufficient documentation

## 2018-05-21 DIAGNOSIS — D61818 Other pancytopenia: Secondary | ICD-10-CM | POA: Insufficient documentation

## 2018-05-21 DIAGNOSIS — Z79899 Other long term (current) drug therapy: Secondary | ICD-10-CM | POA: Diagnosis not present

## 2018-05-21 DIAGNOSIS — Z794 Long term (current) use of insulin: Secondary | ICD-10-CM

## 2018-05-21 DIAGNOSIS — Z923 Personal history of irradiation: Secondary | ICD-10-CM

## 2018-05-21 DIAGNOSIS — IMO0001 Reserved for inherently not codable concepts without codable children: Secondary | ICD-10-CM

## 2018-05-21 DIAGNOSIS — Z5111 Encounter for antineoplastic chemotherapy: Secondary | ICD-10-CM | POA: Diagnosis not present

## 2018-05-21 HISTORY — DX: Personal history of irradiation: Z92.3

## 2018-05-21 LAB — CBC WITH DIFFERENTIAL/PLATELET
Abs Immature Granulocytes: 0.23 10*3/uL — ABNORMAL HIGH (ref 0.00–0.07)
Basophils Absolute: 0 10*3/uL (ref 0.0–0.1)
Basophils Relative: 0 %
EOS ABS: 0 10*3/uL (ref 0.0–0.5)
Eosinophils Relative: 0 %
HCT: 27.4 % — ABNORMAL LOW (ref 36.0–46.0)
Hemoglobin: 8.8 g/dL — ABNORMAL LOW (ref 12.0–15.0)
Immature Granulocytes: 4 %
Lymphocytes Relative: 10 %
Lymphs Abs: 0.6 10*3/uL — ABNORMAL LOW (ref 0.7–4.0)
MCH: 31.7 pg (ref 26.0–34.0)
MCHC: 32.1 g/dL (ref 30.0–36.0)
MCV: 98.6 fL (ref 80.0–100.0)
Monocytes Absolute: 0.2 10*3/uL (ref 0.1–1.0)
Monocytes Relative: 4 %
Neutro Abs: 4.8 10*3/uL (ref 1.7–7.7)
Neutrophils Relative %: 82 %
Platelets: 91 10*3/uL — ABNORMAL LOW (ref 150–400)
RBC: 2.78 MIL/uL — ABNORMAL LOW (ref 3.87–5.11)
RDW: 18.9 % — ABNORMAL HIGH (ref 11.5–15.5)
WBC: 5.8 10*3/uL (ref 4.0–10.5)
nRBC: 0 % (ref 0.0–0.2)

## 2018-05-21 LAB — COMPREHENSIVE METABOLIC PANEL
ALT: 31 U/L (ref 0–44)
AST: 30 U/L (ref 15–41)
Albumin: 3.4 g/dL — ABNORMAL LOW (ref 3.5–5.0)
Alkaline Phosphatase: 78 U/L (ref 38–126)
Anion gap: 14 (ref 5–15)
BUN: 22 mg/dL (ref 8–23)
CO2: 23 mmol/L (ref 22–32)
Calcium: 8.8 mg/dL — ABNORMAL LOW (ref 8.9–10.3)
Chloride: 103 mmol/L (ref 98–111)
Creatinine, Ser: 1.04 mg/dL — ABNORMAL HIGH (ref 0.44–1.00)
GFR calc Af Amer: >60 mL/min (ref >60)
GFR calc non Af Amer: 56 mL/min — ABNORMAL LOW (ref >60)
Glucose, Bld: 227 mg/dL — ABNORMAL HIGH (ref 70–99)
POTASSIUM: 4.6 mmol/L (ref 3.5–5.1)
Sodium: 140 mmol/L (ref 135–145)
Total Bilirubin: 0.6 mg/dL (ref 0.3–1.2)
Total Protein: 7.3 g/dL (ref 6.5–8.1)

## 2018-05-21 MED ORDER — HEPARIN SOD (PORK) LOCK FLUSH 100 UNIT/ML IV SOLN
500.0000 [IU] | Freq: Once | INTRAVENOUS | Status: AC
  Administered 2018-05-21: 500 [IU]
  Filled 2018-05-21: qty 5

## 2018-05-21 MED ORDER — SODIUM CHLORIDE 0.9% FLUSH
10.0000 mL | Freq: Once | INTRAVENOUS | Status: AC
  Administered 2018-05-21: 10 mL
  Filled 2018-05-21: qty 10

## 2018-05-21 MED ORDER — HEPARIN SOD (PORK) LOCK FLUSH 100 UNIT/ML IV SOLN
250.0000 [IU] | Freq: Once | INTRAVENOUS | Status: DC
  Filled 2018-05-21: qty 5

## 2018-05-21 NOTE — Progress Notes (Signed)
[place] OFFICE PROGRESS NOTE  Patient Care Team: Tammi Sou, MD as PCP - General (Family Medicine) Harold Hedge, Darrick Grinder, MD as Consulting Physician (Allergy and Immunology) Juanita Craver, MD as Consulting Physician (Gastroenterology) Martinique, Peter M, MD as Consulting Physician (Cardiology) Megan Salon, MD as Consulting Physician (Gynecology) Isabel Caprice, MD as Consulting Physician (Gynecologic Oncology) Everitt Amber, MD as Consulting Physician (Gynecologic Oncology) Heath Lark, MD as Consulting Physician (Hematology and Oncology)  ASSESSMENT & PLAN:  Endometrial cancer [facility]) The patient will complete radiation treatment today Due to progressive pancytopenia, I do not recommend we proceed with chemotherapy that is scheduled for today We will reschedule her to next week After that, we will resume treatment every 3 weeks We will continue with similar dose adjustment as before She agreed with the plan of care  Pancytopenia, acquired [facility]) She has significant, progressive pancytopenia due to cumulative side effects of chemotherapy and radiation She has some mild bruises We will delay chemotherapy by 1 week I explained to the patient the rationale of delaying her treatment and she agreed with the plan of care  Peripheral neuropathy due to chemotherapy [facility]) she has mild peripheral neuropathy, likely related to side effects of treatment. It is only mild, not bothering the patient. I will observe for now If it gets worse in the future, I will consider modifying the dose of the treatment   Controlled insulin dependent diabetes mellitus with retinopathy ([place]) She has poorly controlled diabetes I plan to omit oral premed dexamethasone in the morning of treatment and continue to work with the patient to reduce carbohydrate intake around the time of treatment Her blood sugar level is satisfactory today   No orders of the defined types were placed in this  encounter.   INTERVAL HISTORY: Please see below for problem oriented charting. She returns for further follow-up and cycle 4 of chemotherapy She saw her primary care doctor recently who has switch her blood pressure medications She felt mild fatigue She has some mild intermittent bruises She has persistent intermittent neuropathy but not severe or worse No recent infection, fever or chills  SUMMARY OF ONCOLOGIC HISTORY: Oncology History   Endometrioid MSI     Endometrial cancer ([place])   01/24/2018 Initial Diagnosis    Patient was seen by GYN for PMB since July 2019.  Pt has hx of complex endometrial hyperplasia without atypica that was treated with Mirena IUD for 5 years.  This was removed about two years ago.  She did have follow up biopsies after placement and did have resolution of hyperplasia.    Patient's last menstrual period was 08/10/2010.    01/25/2018 Pathology Results    Endometrium, biopsy - ENDOMETRIOID ADENOCARCINOMA, FIGO GRADE 1. - SEE COMMENT.    01/25/2018 Procedure    She had endometrial biopsy in GYN office    01/25/2018 Imaging    US pelvis  UTERUS: 8.5 x 5.3 x 4.5cm EMS: 18-57m, areas of vascular flow noted as well ADNEXA: Left ovary: not seen transvaginally or transabdominally due to habitus                  Right ovary: same as with left ovary CUL DE SAC: no free fluid     02/19/2018 Pathology Results    Uterus +/- tubes/ovaries, neoplastic, cervix - UTERUS: -ENDO/MYOMETRIUM: INVASIVE ENDOMETRIOID ADENOCARCINOMA WITH FOCAL SQUAMOUS DIFFERENTIATION (FIGO GRADE 1), SPANNING 5.8 CM. TUMOR INVADES LESS THAN ONE HALF OF THE MYOMETRIUM. TUMOR INVOLVES CERVICAL STROMA  AND BILATERAL FALLOPIAN TUBE LUMEN. SEE ONCOLOGY TABLE. -SEROSA: UNREMARKABLE. NO MALIGNANCY. - CERVIX: STROMAL INVOLVEMENT BY ENDOMETRIOID ADENOCARCINOMA. - BILATERAL OVARIES: INCLUSION CYSTS. NO MALIGNANCY. - BILATERAL FALLOPIAN TUBES: LUMEN INVOLVEMENT BY ENDOMETRIOID  ADENOCARCINOMA. Microscopic Comment UTERUS, CARCINOMA OR CARCINOSARCOMA Procedure: Total hysterectomy with bilateral salpingo-oophorectomy. Histologic type: Endometrioid adenocarcinoma with squamous differentiation. Histologic Grade: FIGO grade 1. Myometrial invasion: Depth of invasion: 3 mm Myometrial thickness: 17 mm Uterine Serosa Involvement: Not identified. Cervical stromal involvement: Present. Extent of involvement of other organs: Involves cervical stroma and lumen of bilateral fallopian tubes. Lymphovascular invasion: Not identified. Regional Lymph Nodes: None examined. Tumor block for ancillary studies: 1D-G MMR / MSI testing: Will be ordered.    02/19/2018 Surgery    Surgeon: Donaciano Eva   Operation: Robotic-assisted laparoscopic total hysterectomy with bilateral salpingoophorectomy (22 modifier for extreme obesity - see details in body of operative note)   Operative Findings:  : extreme intraperitoneal adiposity which prevented retroperitoneal visualization of the lymph node basins and increased the duration of the procedure by 1 hour (for additional instrumentation, placement of retraction sutures, additional personnel required for assistance.  8cm uterus, grossly normal ovaries, surgically interrupted tubes    03/05/2018 Imaging    1. Upper normal pelvic sidewall lymph nodes bilaterally. Close attention on follow-up recommended. 2. No other findings to suggest metastatic disease in the chest, abdomen, or pelvis. 3.  Aortic Atherosclerois (ICD10-170.0)     03/12/2018 Cancer Staging    Staging form: Corpus Uteri - Carcinoma and Carcinosarcoma, AJCC 8th Edition - Pathologic: Stage III (pT3, pN0, cM0) - Signed by Heath Lark, MD on 03/12/2018    03/16/2018 Procedure    Ultrasound and fluoroscopically guided right internal jugular single lumen power port catheter insertion. Tip in the SVC/RA junction. Catheter ready for use.    03/20/2018 -  Chemotherapy     The patient had carboplatin and taxol     REVIEW OF SYSTEMS:   Constitutional: Denies fevers, chills or abnormal weight loss Eyes: Denies blurriness of vision Ears, nose, mouth, throat, and face: Denies mucositis or sore throat Respiratory: Denies cough, dyspnea or wheezes Cardiovascular: Denies palpitation, chest discomfort or lower extremity swelling Gastrointestinal:  Denies nausea, heartburn or change in bowel habits Skin: Denies abnormal skin rashes Lymphatics: Denies new lymphadenopathy or easy bruising Behavioral/Psych: Mood is stable, no new changes  All other systems were reviewed with the patient and are negative.  I have reviewed the past medical history, past surgical history, social history and family history with the patient and they are unchanged from previous note.  ALLERGIES:  is allergic to adhesive [tape]; banana; eggs or egg-derived products; latex; penicillins; pine; and rose.  MEDICATIONS:  Current Outpatient Medications  Medication Sig Dispense Refill  . albuterol (PROVENTIL) (2.5 MG/3ML) 0.083% nebulizer solution Take 3 mLs (2.5 mg total) by nebulization every 4 (four) hours as needed for wheezing or shortness of breath (Dx: J45.50). 75 mL 1  . clobetasol ointment (TEMOVATE) 0.05 % APPLY TO AFFECTED AREA TWICE A DAY (Patient taking differently: Apply 1 application topically 2 (two) times daily as needed (for irritation). ) 60 g 1  . dexamethasone (DECADRON) 4 MG tablet Take 2 tabs at the night before and 2 tab the morning of chemotherapy, every 3 weeks, by mouth 24 tablet 0  . felodipine (PLENDIL) 10 MG 24 hr tablet Take 1 tablet (10 mg total) by mouth daily. 30 tablet 1  . fluticasone (CUTIVATE) 0.05 % cream APPLY TO AFFECTED AREA OF R LOWER LEG  TWICE PER DAY 30 g 0  . fluticasone (FLONASE SENSIMIST) 27.5 MCG/SPRAY nasal spray Place 1 spray into the nose daily as needed for rhinitis.    . furosemide (LASIX) 20 MG tablet TAKE 1 TABLET BY MOUTH EVERY DAY (Patient  taking differently: Take 20 mg by mouth daily. ) 90 tablet 1  . HYDROcodone-acetaminophen (NORCO/VICODIN) 5-325 MG tablet 1-2 tabs po qd prn pain 60 tablet 0  . insulin aspart (NOVOLOG) 100 UNIT/ML injection USE PER SLIDING SCALE AT EACH MEAL (AVG USE IS 17 UNITS THREE TIMES DAILY) 50 mL 2  . insulin NPH Human (NOVOLIN N) 100 UNIT/ML injection 65 U SQ qAM, 65 U SQ mid day, and 61 U SQ q evening (Patient taking differently: Inject 60 Units into the skin 3 (three) times daily. ) 50 mL 5  . Insulin Syringes, Disposable, U-100 0.3 ML MISC Use to inject insulin--6 injections per day 540 each 3  . levocetirizine (XYZAL) 5 MG tablet Take 5 mg by mouth at bedtime as needed for allergies.   5  . levothyroxine (SYNTHROID, LEVOTHROID) 125 MCG tablet TAKE 1 TABLET BY MOUTH EVERY MORNING ON AN EMPTY STOMACH 90 tablet 0  . lidocaine-prilocaine (EMLA) cream Apply to affected area once 30 g 3  . meloxicam (MOBIC) 15 MG tablet TAKE 1 TABLET BY MOUTH EVERY DAY AS NEEDED (Patient taking differently: Take 15 mg by mouth daily as needed for pain. ) 30 tablet 3  . metFORMIN (GLUCOPHAGE) 500 MG tablet TAKE 1 TABLET BY MOUTH EVERY MORNING AND 2 TABLETS BY MOUTH AT BEDTIME 270 tablet 1  . miconazole (MICRO GUARD) 2 % powder Apply 1 application topically 2 (two) times daily as needed for itching.    . montelukast (SINGULAIR) 10 MG tablet Take 10 mg by mouth every evening.     . ondansetron (ZOFRAN) 8 MG tablet Take 1 tablet (8 mg total) by mouth every 8 (eight) hours as needed for refractory nausea / vomiting. Start on day 3 after chemo. 30 tablet 1  . pravastatin (PRAVACHOL) 20 MG tablet TAKE 1 TABLET BY MOUTH EVERY DAY 90 tablet 1  . Probiotic Product (PROBIOTIC PO) Take 1 capsule by mouth daily.    . prochlorperazine (COMPAZINE) 10 MG tablet Take 1 tablet (10 mg total) by mouth every 6 (six) hours as needed (Nausea or vomiting). 30 tablet 1  . senna-docusate (SENOKOT-S) 8.6-50 MG tablet Take 2 tablets by mouth at bedtime.  Hold if having loose stools 30 tablet 1  . SYMBICORT 160-4.5 MCG/ACT inhaler Inhale 2 puffs into the lungs 2 (two) times daily.    . VENTOLIN HFA 108 (90 Base) MCG/ACT inhaler INHALE 2 PUFFS INTO LUNGS EVERY 6 HOURS AS NEEDED FOR WHEEZING/SHORTNESS OF BREATH 18 Inhaler 0   Current Facility-Administered Medications  Medication Dose Route Frequency Provider Last Rate Last Dose  . heparin lock flush 100 unit/mL  250 Units Intracatheter Once Heath Lark, MD        PHYSICAL EXAMINATION: ECOG PERFORMANCE STATUS: 2 - Symptomatic, <50% confined to bed  Vitals:   05/21/18 1017  BP: (!) 148/46  Pulse: 99  Resp: 18  Temp: 97.8 F (36.6 C)  SpO2: 95%   Filed Weights   05/21/18 1017  Weight: 286 lb 3.2 oz (129.8 kg)    GENERAL:alert, no distress and comfortable SKIN: skin color, texture, turgor are normal, no rashes or significant lesions.  Noted minor skin bruises EYES: normal, Conjunctiva are pink and non-injected, sclera clear OROPHARYNX:no exudate, no erythema and  lips, buccal mucosa, and tongue normal  NECK: supple, thyroid normal size, non-tender, without nodularity LYMPH:  no palpable lymphadenopathy in the cervical, axillary or inguinal LUNGS: clear to auscultation and percussion with normal breathing effort HEART: regular rate & rhythm and no murmurs and no lower extremity edema ABDOMEN:abdomen soft, non-tender and normal bowel sounds Musculoskeletal:no cyanosis of digits and no clubbing  NEURO: alert & oriented x 3 with fluent speech, no focal motor/sensory deficits  LABORATORY DATA:  I have reviewed the data as listed    Component Value Date/Time   NA 140 05/21/2018 0940   K 4.6 05/21/2018 0940   CL 103 05/21/2018 0940   CO2 23 05/21/2018 0940   GLUCOSE 227 (H) 05/21/2018 0940   BUN 22 05/21/2018 0940   CREATININE 1.04 (H) 05/21/2018 0940   CALCIUM 8.8 (L) 05/21/2018 0940   PROT 7.3 05/21/2018 0940   ALBUMIN 3.4 (L) 05/21/2018 0940   AST 30 05/21/2018 0940   ALT 31  05/21/2018 0940   ALKPHOS 78 05/21/2018 0940   BILITOT 0.6 05/21/2018 0940   GFRNONAA 56 (L) 05/21/2018 0940   GFRAA >60 05/21/2018 0940    No results found for: SPEP, UPEP  Lab Results  Component Value Date   WBC 5.8 05/21/2018   NEUTROABS 4.8 05/21/2018   HGB 8.8 (L) 05/21/2018   HCT 27.4 (L) 05/21/2018   MCV 98.6 05/21/2018   PLT 91 (L) 05/21/2018      Chemistry      Component Value Date/Time   NA 140 05/21/2018 0940   K 4.6 05/21/2018 0940   CL 103 05/21/2018 0940   CO2 23 05/21/2018 0940   BUN 22 05/21/2018 0940   CREATININE 1.04 (H) 05/21/2018 0940      Component Value Date/Time   CALCIUM 8.8 (L) 05/21/2018 0940   ALKPHOS 78 05/21/2018 0940   AST 30 05/21/2018 0940   ALT 31 05/21/2018 0940   BILITOT 0.6 05/21/2018 0940       All questions were answered. The patient knows to call the clinic with any problems, questions or concerns. No barriers to learning was detected.  I spent 25 minutes counseling the patient face to face. The total time spent in the appointment was 30 minutes and more than 50% was on counseling and review of test results  Heath Lark, MD 05/21/2018 1:06 PM

## 2018-05-21 NOTE — Assessment & Plan Note (Signed)
she has mild peripheral neuropathy, likely related to side effects of treatment. It is only mild, not bothering the patient. I will observe for now If it gets worse in the future, I will consider modifying the dose of the treatment  

## 2018-05-21 NOTE — Assessment & Plan Note (Signed)
The patient will complete radiation treatment today Due to progressive pancytopenia, I do not recommend we proceed with chemotherapy that is scheduled for today We will reschedule her to next week After that, we will resume treatment every 3 weeks We will continue with similar dose adjustment as before She agreed with the plan of care

## 2018-05-21 NOTE — Telephone Encounter (Signed)
Gave avs and calendar ° °

## 2018-05-21 NOTE — Progress Notes (Signed)
  Radiation Oncology         [phone]) [phone] ________________________________  Name: MADHAVI HAMBLEN MRN: [id number]  Date: 05/21/2018  DOB: October 14, 1950  CC: McGowen, Adrian Blackwater, MD  Everitt Amber, MD  HDR BRACHYTHERAPY NOTE  DIAGNOSIS: 68 y.o. female with Stage IIIA endometroid endometrial carcinoma   Simple treatment device note: Patient had construction of her custom vaginal cylinder. She will be treated with a 2.5 cm diameter segmented cylinder. This conforms to her anatomy without undue discomfort.  Vaginal brachytherapy procedure node: The patient was brought to the [place] suite. Identity was confirmed. All relevant records and images related to the planned course of therapy were reviewed. The patient freely provided informed written consent to proceed with treatment after reviewing the details related to the planned course of therapy. The consent form was witnessed and verified by the simulation staff. Then, the patient was set-up in a stable reproducible supine position for radiation therapy. Pelvic exam revealed the vaginal cuff to be intact. The patient's custom vaginal cylinder was placed in the proximal vagina. This was affixed to the CT/MR stabilization plate to prevent slippage. Patient tolerated the placement well.  Verification simulation note:  A fiducial marker was placed within the vaginal cylinder. An AP and lateral film was then obtained through the pelvis area. This documented accurate position of the vaginal cylinder for treatment.  HDR BRACHYTHERAPY TREATMENT  The remote afterloading device was affixed to the vaginal cylinder by catheter. Patient then proceeded to undergo her fifth high-dose-rate treatment directed at the proximal vagina. The patient was prescribed a dose of 6 gray to be delivered to the mucosal surface. Treatment length was 3 cm. Patient was treated with 1 channel using 7 dwell positions. Treatment time was 187.90 seconds. Iridium 192 was the high-dose-rate source  for treatment. The patient tolerated the treatment well. After completion of her therapy, a radiation survey was performed documenting return of the iridium source into the [employer] safe.   PLAN: She had completed her HDR treatments. Follow-up in radiation oncology in one month.   ________________________________  Blair Promise, PhD, MD   This document serves as a record of services personally performed by Gery Pray, MD. It was created on his behalf by Mary-Margaret Loma Messing, a trained medical scribe. The creation of this record is based on the scribe's personal observations and the provider's statements to them. This document has been checked and approved by the attending provider.

## 2018-05-21 NOTE — Assessment & Plan Note (Signed)
She has significant, progressive pancytopenia due to cumulative side effects of chemotherapy and radiation She has some mild bruises We will delay chemotherapy by 1 week I explained to the patient the rationale of delaying her treatment and she agreed with the plan of care

## 2018-05-21 NOTE — Progress Notes (Signed)
  Radiation Oncology         [phone] ________________________________  Name: [name] MRN: [id number]  Date: 05/21/2018  DOB: 07-30-1950  End of Treatment Note  Diagnosis:   Stage IIIA endometroid endometrial carcinoma     Indication for treatment:  Curative       Radiation treatment dates:   04/24/18, 05/03/18, 05/07/18, 05/16/18, 05/21/18  Site/dose:  Vaginal cuff, 6 Gy in 5 fractions for a total dose of 30 Gy  Beams/energy:   HDR Ir-Vaginal, Iridium HDR, patient was treated with a 2.5 cm diameter segmented cylinder, Rx length of 3 cm, prescription was 6 gray to the mucosal surface  Narrative: The patient tolerated radiation treatment relatively well.  At the beginning of treatment, pt reported moderate fatigue and pain in right wrist and sciatica. Pt denies dysuria/hematuria, as well as vaginal bleeding/discharge and rectal bleeding. Pt reported diarrhea and took Imodium as needed. Overall the pt was without complaints.   Plan: The patient has completed radiation treatment. The patient will return to radiation oncology clinic for routine followup in one month. I advised them to call or return sooner if they have any questions or concerns related to their recovery or treatment.  -----------------------------------  Blair Promise, PhD, MD  This document serves as a record of services personally performed by Gery Pray, MD. It was created on his behalf by Mary-Margaret Loma Messing, a trained medical scribe. The creation of this record is based on the scribe's personal observations and the provider's statements to them. This document has been checked and approved by the attending provider.

## 2018-05-21 NOTE — Assessment & Plan Note (Signed)
She has poorly controlled diabetes I plan to omit oral premed dexamethasone in the morning of treatment and continue to work with the patient to reduce carbohydrate intake around the time of treatment Her blood sugar level is satisfactory today

## 2018-05-22 ENCOUNTER — Ambulatory Visit: Payer: Medicare Other | Admitting: Radiation Oncology

## 2018-05-23 ENCOUNTER — Ambulatory Visit: Payer: Medicare Other | Admitting: Radiation Oncology

## 2018-05-23 ENCOUNTER — Other Ambulatory Visit: Payer: Self-pay | Admitting: Family Medicine

## 2018-05-23 NOTE — Telephone Encounter (Signed)
Please see lab results done on 05/21/18. Okay to refill? Please advise. Thanks.

## 2018-05-27 ENCOUNTER — Other Ambulatory Visit: Payer: Self-pay | Admitting: Family Medicine

## 2018-05-28 NOTE — Telephone Encounter (Signed)
RF request for meloxicam LOV: 05/18/18 Next ov: 08/16/18 Last written: 11/27/17 #30 w/ 3RF  Please advise. Thanks.

## 2018-05-30 ENCOUNTER — Inpatient Hospital Stay: Payer: Medicare Other

## 2018-05-30 VITALS — BP 135/48 | HR 95 | Temp 97.9°F | Resp 16

## 2018-05-30 DIAGNOSIS — D61818 Other pancytopenia: Secondary | ICD-10-CM | POA: Diagnosis not present

## 2018-05-30 DIAGNOSIS — E11319 Type 2 diabetes mellitus with unspecified diabetic retinopathy without macular edema: Secondary | ICD-10-CM

## 2018-05-30 DIAGNOSIS — Z5111 Encounter for antineoplastic chemotherapy: Secondary | ICD-10-CM | POA: Diagnosis not present

## 2018-05-30 DIAGNOSIS — E1165 Type 2 diabetes mellitus with hyperglycemia: Secondary | ICD-10-CM | POA: Diagnosis not present

## 2018-05-30 DIAGNOSIS — Z794 Long term (current) use of insulin: Secondary | ICD-10-CM

## 2018-05-30 DIAGNOSIS — C541 Malignant neoplasm of endometrium: Secondary | ICD-10-CM

## 2018-05-30 DIAGNOSIS — G620 Drug-induced polyneuropathy: Secondary | ICD-10-CM | POA: Diagnosis not present

## 2018-05-30 DIAGNOSIS — E1110 Type 2 diabetes mellitus with ketoacidosis without coma: Secondary | ICD-10-CM

## 2018-05-30 DIAGNOSIS — IMO0001 Reserved for inherently not codable concepts without codable children: Secondary | ICD-10-CM

## 2018-05-30 DIAGNOSIS — R739 Hyperglycemia, unspecified: Secondary | ICD-10-CM

## 2018-05-30 LAB — CBC WITH DIFFERENTIAL/PLATELET
Abs Immature Granulocytes: 0.23 10*3/uL — ABNORMAL HIGH (ref 0.00–0.07)
BASOS ABS: 0 10*3/uL (ref 0.0–0.1)
Basophils Relative: 0 %
EOS ABS: 0 10*3/uL (ref 0.0–0.5)
Eosinophils Relative: 1 %
HCT: 29.5 % — ABNORMAL LOW (ref 36.0–46.0)
Hemoglobin: 9.2 g/dL — ABNORMAL LOW (ref 12.0–15.0)
IMMATURE GRANULOCYTES: 4 %
Lymphocytes Relative: 13 %
Lymphs Abs: 0.7 10*3/uL (ref 0.7–4.0)
MCH: 31.6 pg (ref 26.0–34.0)
MCHC: 31.2 g/dL (ref 30.0–36.0)
MCV: 101.4 fL — ABNORMAL HIGH (ref 80.0–100.0)
Monocytes Absolute: 0.2 10*3/uL (ref 0.1–1.0)
Monocytes Relative: 5 %
Neutro Abs: 4.1 10*3/uL (ref 1.7–7.7)
Neutrophils Relative %: 77 %
Platelets: 172 10*3/uL (ref 150–400)
RBC: 2.91 MIL/uL — AB (ref 3.87–5.11)
RDW: 20.1 % — ABNORMAL HIGH (ref 11.5–15.5)
WBC: 5.3 10*3/uL (ref 4.0–10.5)
nRBC: 0 % (ref 0.0–0.2)

## 2018-05-30 LAB — COMPREHENSIVE METABOLIC PANEL
ALT: 29 U/L (ref 0–44)
AST: 29 U/L (ref 15–41)
Albumin: 3.4 g/dL — ABNORMAL LOW (ref 3.5–5.0)
Alkaline Phosphatase: 80 U/L (ref 38–126)
Anion gap: 14 (ref 5–15)
BUN: 21 mg/dL (ref 8–23)
CO2: 23 mmol/L (ref 22–32)
Calcium: 9.1 mg/dL (ref 8.9–10.3)
Chloride: 104 mmol/L (ref 98–111)
Creatinine, Ser: 0.99 mg/dL (ref 0.44–1.00)
GFR calc Af Amer: >60 mL/min (ref >60)
GFR calc non Af Amer: 59 mL/min — ABNORMAL LOW (ref >60)
Glucose, Bld: 264 mg/dL — ABNORMAL HIGH (ref 70–99)
Potassium: 4.8 mmol/L (ref 3.5–5.1)
SODIUM: 141 mmol/L (ref 135–145)
Total Bilirubin: 0.4 mg/dL (ref 0.3–1.2)
Total Protein: 7.6 g/dL (ref 6.5–8.1)

## 2018-05-30 MED ORDER — DIPHENHYDRAMINE HCL 50 MG/ML IJ SOLN
INTRAMUSCULAR | Status: AC
  Filled 2018-05-30: qty 1

## 2018-05-30 MED ORDER — SODIUM CHLORIDE 0.9 % IV SOLN
Freq: Once | INTRAVENOUS | Status: AC
  Administered 2018-05-30: 14:00:00 via INTRAVENOUS
  Filled 2018-05-30: qty 5

## 2018-05-30 MED ORDER — SODIUM CHLORIDE 0.9 % IV SOLN
Freq: Once | INTRAVENOUS | Status: AC
  Administered 2018-05-30: 13:00:00 via INTRAVENOUS
  Filled 2018-05-30: qty 250

## 2018-05-30 MED ORDER — DIPHENHYDRAMINE HCL 50 MG/ML IJ SOLN
50.0000 mg | Freq: Once | INTRAMUSCULAR | Status: AC
  Administered 2018-05-30: 50 mg via INTRAVENOUS

## 2018-05-30 MED ORDER — PALONOSETRON HCL INJECTION 0.25 MG/5ML
INTRAVENOUS | Status: AC
  Filled 2018-05-30: qty 5

## 2018-05-30 MED ORDER — SODIUM CHLORIDE 0.9% FLUSH
10.0000 mL | INTRAVENOUS | Status: DC | PRN
  Administered 2018-05-30: 10 mL
  Filled 2018-05-30: qty 10

## 2018-05-30 MED ORDER — SODIUM CHLORIDE 0.9% FLUSH
10.0000 mL | Freq: Once | INTRAVENOUS | Status: AC
  Administered 2018-05-30: 10 mL
  Filled 2018-05-30: qty 10

## 2018-05-30 MED ORDER — SODIUM CHLORIDE 0.9 % IV SOLN
750.0000 mg | Freq: Once | INTRAVENOUS | Status: AC
  Administered 2018-05-30: 750 mg via INTRAVENOUS
  Filled 2018-05-30: qty 75

## 2018-05-30 MED ORDER — INSULIN REGULAR HUMAN 100 UNIT/ML IJ SOLN
15.0000 [IU] | Freq: Once | INTRAMUSCULAR | Status: AC
  Administered 2018-05-30: 15 [IU] via SUBCUTANEOUS
  Filled 2018-05-30: qty 10

## 2018-05-30 MED ORDER — PALONOSETRON HCL INJECTION 0.25 MG/5ML
0.2500 mg | Freq: Once | INTRAVENOUS | Status: AC
  Administered 2018-05-30: 0.25 mg via INTRAVENOUS

## 2018-05-30 MED ORDER — FAMOTIDINE IN NACL 20-0.9 MG/50ML-% IV SOLN
INTRAVENOUS | Status: AC
  Filled 2018-05-30: qty 50

## 2018-05-30 MED ORDER — FAMOTIDINE IN NACL 20-0.9 MG/50ML-% IV SOLN
20.0000 mg | Freq: Once | INTRAVENOUS | Status: AC
  Administered 2018-05-30: 20 mg via INTRAVENOUS

## 2018-05-30 MED ORDER — SODIUM CHLORIDE 0.9 % IV SOLN
140.0000 mg/m2 | Freq: Once | INTRAVENOUS | Status: AC
  Administered 2018-05-30: 330 mg via INTRAVENOUS
  Filled 2018-05-30: qty 55

## 2018-05-30 MED ORDER — HEPARIN SOD (PORK) LOCK FLUSH 100 UNIT/ML IV SOLN
500.0000 [IU] | Freq: Once | INTRAVENOUS | Status: AC | PRN
  Administered 2018-05-30: 500 [IU]
  Filled 2018-05-30: qty 5

## 2018-05-30 NOTE — Patient Instructions (Signed)
[facility] Discharge Instructions for Patients Receiving Chemotherapy  Today you received the following chemotherapy agents Taxol/Carboplatin  To help prevent nausea and vomiting after your treatment, we encourage you to take your nausea medication as prescribed.   If you develop nausea and vomiting that is not controlled by your nausea medication, call the clinic.   BELOW ARE SYMPTOMS THAT SHOULD BE REPORTED IMMEDIATELY:  *FEVER GREATER THAN 100.5 F  *CHILLS WITH OR WITHOUT FEVER  NAUSEA AND VOMITING THAT IS NOT CONTROLLED WITH YOUR NAUSEA MEDICATION  *UNUSUAL SHORTNESS OF BREATH  *UNUSUAL BRUISING OR BLEEDING  TENDERNESS IN MOUTH AND THROAT WITH OR WITHOUT PRESENCE OF ULCERS  *URINARY PROBLEMS  *BOWEL PROBLEMS  UNUSUAL RASH Items with * indicate a potential emergency and should be followed up as soon as possible.  Feel free to call the clinic should you have any questions or concerns. The clinic phone number is [phone].  Please show the CHEMO ALERT CARD at check-in to the Emergency Department and triage nurse.   

## 2018-05-30 NOTE — Progress Notes (Signed)
Called MD-Dr Alvy Bimler & received order for 15 units regular insulin for elevated blood sugar-267.  Insulin given @ 2:45 pm. Capillary glucose check @ 16:30 = 199.

## 2018-05-31 LAB — GLUCOSE, CAPILLARY: Glucose-Capillary: 199 mg/dL — ABNORMAL HIGH (ref 70–99)

## 2018-06-10 ENCOUNTER — Encounter: Payer: Self-pay | Admitting: Family Medicine

## 2018-06-19 ENCOUNTER — Other Ambulatory Visit: Payer: Self-pay | Admitting: Hematology and Oncology

## 2018-06-19 ENCOUNTER — Inpatient Hospital Stay: Payer: Medicare Other

## 2018-06-19 ENCOUNTER — Encounter: Payer: Self-pay | Admitting: Oncology

## 2018-06-19 ENCOUNTER — Inpatient Hospital Stay: Payer: Medicare Other | Attending: Obstetrics

## 2018-06-19 ENCOUNTER — Inpatient Hospital Stay (HOSPITAL_BASED_OUTPATIENT_CLINIC_OR_DEPARTMENT_OTHER): Payer: Medicare Other | Admitting: Hematology and Oncology

## 2018-06-19 ENCOUNTER — Encounter: Payer: Self-pay | Admitting: Hematology and Oncology

## 2018-06-19 ENCOUNTER — Telehealth: Payer: Self-pay | Admitting: Hematology and Oncology

## 2018-06-19 DIAGNOSIS — E11319 Type 2 diabetes mellitus with unspecified diabetic retinopathy without macular edema: Secondary | ICD-10-CM | POA: Insufficient documentation

## 2018-06-19 DIAGNOSIS — T451X5S Adverse effect of antineoplastic and immunosuppressive drugs, sequela: Secondary | ICD-10-CM | POA: Diagnosis not present

## 2018-06-19 DIAGNOSIS — Z5111 Encounter for antineoplastic chemotherapy: Secondary | ICD-10-CM | POA: Diagnosis not present

## 2018-06-19 DIAGNOSIS — R5383 Other fatigue: Secondary | ICD-10-CM | POA: Diagnosis not present

## 2018-06-19 DIAGNOSIS — D61818 Other pancytopenia: Secondary | ICD-10-CM

## 2018-06-19 DIAGNOSIS — E1165 Type 2 diabetes mellitus with hyperglycemia: Secondary | ICD-10-CM

## 2018-06-19 DIAGNOSIS — Z794 Long term (current) use of insulin: Secondary | ICD-10-CM | POA: Insufficient documentation

## 2018-06-19 DIAGNOSIS — G620 Drug-induced polyneuropathy: Secondary | ICD-10-CM

## 2018-06-19 DIAGNOSIS — Z791 Long term (current) use of non-steroidal anti-inflammatories (NSAID): Secondary | ICD-10-CM | POA: Insufficient documentation

## 2018-06-19 DIAGNOSIS — Z79899 Other long term (current) drug therapy: Secondary | ICD-10-CM | POA: Diagnosis not present

## 2018-06-19 DIAGNOSIS — C541 Malignant neoplasm of endometrium: Secondary | ICD-10-CM | POA: Diagnosis not present

## 2018-06-19 DIAGNOSIS — IMO0001 Reserved for inherently not codable concepts without codable children: Secondary | ICD-10-CM

## 2018-06-19 DIAGNOSIS — Z923 Personal history of irradiation: Secondary | ICD-10-CM | POA: Insufficient documentation

## 2018-06-19 DIAGNOSIS — T451X5A Adverse effect of antineoplastic and immunosuppressive drugs, initial encounter: Secondary | ICD-10-CM

## 2018-06-19 LAB — COMPREHENSIVE METABOLIC PANEL
ALT: 26 U/L (ref 0–44)
AST: 30 U/L (ref 15–41)
Albumin: 3.3 g/dL — ABNORMAL LOW (ref 3.5–5.0)
Alkaline Phosphatase: 72 U/L (ref 38–126)
Anion gap: 15 (ref 5–15)
BUN: 23 mg/dL (ref 8–23)
CO2: 22 mmol/L (ref 22–32)
Calcium: 9 mg/dL (ref 8.9–10.3)
Chloride: 103 mmol/L (ref 98–111)
Creatinine, Ser: 0.92 mg/dL (ref 0.44–1.00)
GFR calc non Af Amer: >60 mL/min (ref >60)
Glucose, Bld: 292 mg/dL — ABNORMAL HIGH (ref 70–99)
Potassium: 4.6 mmol/L (ref 3.5–5.1)
SODIUM: 140 mmol/L (ref 135–145)
Total Bilirubin: 0.4 mg/dL (ref 0.3–1.2)
Total Protein: 7.6 g/dL (ref 6.5–8.1)

## 2018-06-19 LAB — CBC WITH DIFFERENTIAL/PLATELET
Abs Immature Granulocytes: 0.11 10*3/uL — ABNORMAL HIGH (ref 0.00–0.07)
Basophils Absolute: 0 10*3/uL (ref 0.0–0.1)
Basophils Relative: 0 %
Eosinophils Absolute: 0 10*3/uL (ref 0.0–0.5)
Eosinophils Relative: 0 %
HCT: 27.1 % — ABNORMAL LOW (ref 36.0–46.0)
Hemoglobin: 8.4 g/dL — ABNORMAL LOW (ref 12.0–15.0)
Immature Granulocytes: 2 %
Lymphocytes Relative: 10 %
Lymphs Abs: 0.5 10*3/uL — ABNORMAL LOW (ref 0.7–4.0)
MCH: 32.3 pg (ref 26.0–34.0)
MCHC: 31 g/dL (ref 30.0–36.0)
MCV: 104.2 fL — ABNORMAL HIGH (ref 80.0–100.0)
Monocytes Absolute: 0.2 10*3/uL (ref 0.1–1.0)
Monocytes Relative: 4 %
Neutro Abs: 4.2 10*3/uL (ref 1.7–7.7)
Neutrophils Relative %: 84 %
Platelets: 109 10*3/uL — ABNORMAL LOW (ref 150–400)
RBC: 2.6 MIL/uL — ABNORMAL LOW (ref 3.87–5.11)
RDW: 19.4 % — ABNORMAL HIGH (ref 11.5–15.5)
WBC: 5.1 10*3/uL (ref 4.0–10.5)
nRBC: 0 % (ref 0.0–0.2)

## 2018-06-19 MED ORDER — DIPHENHYDRAMINE HCL 50 MG/ML IJ SOLN
50.0000 mg | Freq: Once | INTRAMUSCULAR | Status: AC
  Administered 2018-06-19: 50 mg via INTRAVENOUS

## 2018-06-19 MED ORDER — PALONOSETRON HCL INJECTION 0.25 MG/5ML
INTRAVENOUS | Status: AC
  Filled 2018-06-19: qty 5

## 2018-06-19 MED ORDER — SODIUM CHLORIDE 0.9% FLUSH
10.0000 mL | Freq: Once | INTRAVENOUS | Status: AC
  Administered 2018-06-19: 10 mL
  Filled 2018-06-19: qty 10

## 2018-06-19 MED ORDER — SODIUM CHLORIDE 0.9 % IV SOLN
20.0000 mg | Freq: Once | INTRAVENOUS | Status: AC
  Administered 2018-06-19: 20 mg via INTRAVENOUS
  Filled 2018-06-19: qty 2

## 2018-06-19 MED ORDER — SODIUM CHLORIDE 0.9 % IV SOLN
140.0000 mg/m2 | Freq: Once | INTRAVENOUS | Status: AC
  Administered 2018-06-19: 330 mg via INTRAVENOUS
  Filled 2018-06-19: qty 55

## 2018-06-19 MED ORDER — SODIUM CHLORIDE 0.9 % IV SOLN
Freq: Once | INTRAVENOUS | Status: AC
  Administered 2018-06-19: 11:00:00 via INTRAVENOUS
  Filled 2018-06-19: qty 5

## 2018-06-19 MED ORDER — INSULIN REGULAR HUMAN 100 UNIT/ML IJ SOLN
15.0000 [IU] | Freq: Once | INTRAMUSCULAR | Status: AC
  Administered 2018-06-19: 15 [IU] via SUBCUTANEOUS
  Filled 2018-06-19: qty 10

## 2018-06-19 MED ORDER — SODIUM CHLORIDE 0.9% FLUSH
10.0000 mL | INTRAVENOUS | Status: DC | PRN
  Administered 2018-06-19: 10 mL
  Filled 2018-06-19: qty 10

## 2018-06-19 MED ORDER — PALONOSETRON HCL INJECTION 0.25 MG/5ML
0.2500 mg | Freq: Once | INTRAVENOUS | Status: AC
  Administered 2018-06-19: 0.25 mg via INTRAVENOUS

## 2018-06-19 MED ORDER — HEPARIN SOD (PORK) LOCK FLUSH 100 UNIT/ML IV SOLN
500.0000 [IU] | Freq: Once | INTRAVENOUS | Status: AC | PRN
  Administered 2018-06-19: 500 [IU]
  Filled 2018-06-19: qty 5

## 2018-06-19 MED ORDER — SODIUM CHLORIDE 0.9 % IV SOLN
Freq: Once | INTRAVENOUS | Status: AC
  Administered 2018-06-19: 10:00:00 via INTRAVENOUS
  Filled 2018-06-19: qty 250

## 2018-06-19 MED ORDER — SODIUM CHLORIDE 0.9 % IV SOLN
750.0000 mg | Freq: Once | INTRAVENOUS | Status: AC
  Administered 2018-06-19: 750 mg via INTRAVENOUS
  Filled 2018-06-19: qty 75

## 2018-06-19 MED ORDER — FAMOTIDINE IN NACL 20-0.9 MG/50ML-% IV SOLN: 20.0000 mg | Freq: Once | INTRAVENOUS | Status: DC

## 2018-06-19 MED ORDER — DIPHENHYDRAMINE HCL 50 MG/ML IJ SOLN
INTRAMUSCULAR | Status: AC
  Filled 2018-06-19: qty 1

## 2018-06-19 NOTE — Progress Notes (Signed)
[facility] OFFICE PROGRESS NOTE  Patient Care Team: Tammi Sou, MD as PCP - General (Family Medicine) Harold Hedge, Darrick Grinder, MD as Consulting Physician (Allergy and Immunology) Juanita Craver, MD as Consulting Physician (Gastroenterology) Martinique, Peter M, MD as Consulting Physician (Cardiology) Megan Salon, MD as Consulting Physician (Gynecology) Isabel Caprice, MD as Consulting Physician (Gynecologic Oncology) Everitt Amber, MD as Consulting Physician (Gynecologic Oncology) Heath Lark, MD as Consulting Physician (Hematology and Oncology)  ASSESSMENT & PLAN:  Endometrial cancer [facility]) The patient will complete radiation treatment recently [name] is noted to have progressive pancytopenia We will proceed with treatment with similar dose adjustment We discussed the risk and benefits of transfusion support versus waiting an extra week for her next cycle of treatment  Peripheral neuropathy due to chemotherapy [facility]) [name] has mild peripheral neuropathy, likely related to side effects of treatment. It is only mild, not bothering the patient. I will observe for now If it gets worse in the future, I will consider modifying the dose of the treatment   Controlled insulin dependent diabetes mellitus with retinopathy ([place]) [name] has poorly controlled diabetes I plan to omit oral premed dexamethasone in the morning of treatment and continue to work with the patient to reduce carbohydrate intake around the time of treatment Her blood sugar level is high and [name] will receive a dose of subcutaneous insulin during treatment  Pancytopenia, acquired ([place]) [name] is mildly symptomatic with fatigue We will proceed with treatment today I anticipate [name] might need transfusion support with her hemoglobin trending down We discussed the risk and benefits of transfusion support next week but the patient declined I recommend delaying her treatment 1 more week for her final treatment to allow bone  marrow recovering before her next cycle   No orders of the defined types were placed in this encounter.   INTERVAL HISTORY: Please see below for problem oriented charting. [name] returns with her daughter and family member for cycle 5 of chemotherapy [name] complained of mild fatigue Denies worsening peripheral neuropathy.  The tingling sensation comes and goes [name] denies nausea or recent changes in bowel habits The patient denies any recent signs or symptoms of bleeding such as spontaneous epistaxis, hematuria or hematochezia.  SUMMARY OF ONCOLOGIC HISTORY: Oncology History   Endometrioid MSI     Endometrial cancer ([place])   01/24/2018 Initial Diagnosis    Patient was seen by GYN for PMB since July 2019.  Pt has hx of complex endometrial hyperplasia without atypica that was treated with Mirena IUD for 5 years.  This was removed about two years ago.  [name] did have follow up biopsies after placement and did have resolution of hyperplasia.    Patient's last menstrual period was 08/10/2010.    01/25/2018 Pathology Results    Endometrium, biopsy - ENDOMETRIOID ADENOCARCINOMA, FIGO GRADE 1. - SEE COMMENT.    01/25/2018 Procedure    [name] had endometrial biopsy in GYN office    01/25/2018 Imaging    US pelvis  UTERUS: 8.5 x 5.3 x 4.5cm EMS: 18-76m, areas of vascular flow noted as well ADNEXA: Left ovary: not seen transvaginally or transabdominally due to habitus                  Right ovary: same as with left ovary CUL DE SAC: no free fluid     02/19/2018 Pathology Results    Uterus +/- tubes/ovaries, neoplastic, cervix - UTERUS: -ENDO/MYOMETRIUM: INVASIVE ENDOMETRIOID ADENOCARCINOMA WITH FOCAL SQUAMOUS DIFFERENTIATION (FIGO GRADE  1), SPANNING 5.8 CM. TUMOR INVADES LESS THAN ONE HALF OF THE MYOMETRIUM. TUMOR INVOLVES CERVICAL STROMA AND BILATERAL FALLOPIAN TUBE LUMEN. SEE ONCOLOGY TABLE. -SEROSA: UNREMARKABLE. NO MALIGNANCY. - CERVIX: STROMAL INVOLVEMENT BY ENDOMETRIOID  ADENOCARCINOMA. - BILATERAL OVARIES: INCLUSION CYSTS. NO MALIGNANCY. - BILATERAL FALLOPIAN TUBES: LUMEN INVOLVEMENT BY ENDOMETRIOID ADENOCARCINOMA. Microscopic Comment UTERUS, CARCINOMA OR CARCINOSARCOMA Procedure: Total hysterectomy with bilateral salpingo-oophorectomy. Histologic type: Endometrioid adenocarcinoma with squamous differentiation. Histologic Grade: FIGO grade 1. Myometrial invasion: Depth of invasion: 3 mm Myometrial thickness: 17 mm Uterine Serosa Involvement: Not identified. Cervical stromal involvement: Present. Extent of involvement of other organs: Involves cervical stroma and lumen of bilateral fallopian tubes. Lymphovascular invasion: Not identified. Regional Lymph Nodes: None examined. Tumor block for ancillary studies: 1D-G MMR / MSI testing: Will be ordered.    02/19/2018 Surgery    Surgeon: Donaciano Eva   Operation: Robotic-assisted laparoscopic total hysterectomy with bilateral salpingoophorectomy (22 modifier for extreme obesity - see details in body of operative note)   Operative Findings:  : extreme intraperitoneal adiposity which prevented retroperitoneal visualization of the lymph node basins and increased the duration of the procedure by 1 hour (for additional instrumentation, placement of retraction sutures, additional personnel required for assistance.  8cm uterus, grossly normal ovaries, surgically interrupted tubes    03/05/2018 Imaging    1. Upper normal pelvic sidewall lymph nodes bilaterally. Close attention on follow-up recommended. 2. No other findings to suggest metastatic disease in the chest, abdomen, or pelvis. 3.  Aortic Atherosclerois (ICD10-170.0)     03/12/2018 Cancer Staging    Staging form: Corpus Uteri - Carcinoma and Carcinosarcoma, AJCC 8th Edition - Pathologic: Stage III (pT3, pN0, cM0) - Signed by Heath Lark, MD on 03/12/2018    03/16/2018 Procedure    Ultrasound and fluoroscopically guided right internal jugular  single lumen power port catheter insertion. Tip in the SVC/RA junction. Catheter ready for use.    03/20/2018 -  Chemotherapy    The patient had carboplatin and taxol    04/24/2018 - 05/21/2018 Radiation Therapy    Radiation treatment dates:   04/24/18, 05/03/18, 05/07/18, 05/16/18, 05/21/18  Site/dose:  Vaginal cuff, 6 Gy in 5 fractions for a total dose of 30 Gy  Beams/energy:   HDR Ir-Vaginal, Iridium HDR,patient was treated with a 2.5 cm diameter segmented cylinder, Rxlength of 3 cm, prescription was 6 gray to the mucosal surface      REVIEW OF SYSTEMS:   Constitutional: Denies fevers, chills or abnormal weight loss Eyes: Denies blurriness of vision Ears, nose, mouth, throat, and face: Denies mucositis or sore throat Respiratory: Denies cough, dyspnea or wheezes Cardiovascular: Denies palpitation, chest discomfort or lower extremity swelling Gastrointestinal:  Denies nausea, heartburn or change in bowel habits Skin: Denies abnormal skin rashes Lymphatics: Denies new lymphadenopathy or easy bruising Behavioral/Psych: Mood is stable, no new changes  All other systems were reviewed with the patient and are negative.  I have reviewed the past medical history, past surgical history, social history and family history with the patient and they are unchanged from previous note.  ALLERGIES:  is allergic to adhesive [tape]; banana; eggs or egg-derived products; latex; penicillins; pine; and rose.  MEDICATIONS:  Current Outpatient Medications  Medication Sig Dispense Refill  . albuterol (PROVENTIL) (2.5 MG/3ML) 0.083% nebulizer solution Take 3 mLs (2.5 mg total) by nebulization every 4 (four) hours as needed for wheezing or shortness of breath (Dx: J45.50). 75 mL 1  . clobetasol ointment (TEMOVATE) 0.05 % APPLY TO AFFECTED AREA TWICE A  DAY (Patient taking differently: Apply 1 application topically 2 (two) times daily as needed (for irritation). ) 60 g 1  . dexamethasone (DECADRON) 4 MG  tablet Take 2 tabs at the night before and 2 tab the morning of chemotherapy, every 3 weeks, by mouth 24 tablet 0  . felodipine (PLENDIL) 10 MG 24 hr tablet TAKE 1 TABLET BY MOUTH EVERY DAY 90 tablet 1  . fluticasone (CUTIVATE) 0.05 % cream APPLY TO AFFECTED AREA OF R LOWER LEG TWICE PER DAY 30 g 0  . fluticasone (FLONASE SENSIMIST) 27.5 MCG/SPRAY nasal spray Place 1 spray into the nose daily as needed for rhinitis.    . furosemide (LASIX) 20 MG tablet TAKE 1 TABLET BY MOUTH EVERY DAY (Patient taking differently: Take 20 mg by mouth daily. ) 90 tablet 1  . HYDROcodone-acetaminophen (NORCO/VICODIN) 5-325 MG tablet 1-2 tabs po qd prn pain 60 tablet 0  . insulin aspart (NOVOLOG) 100 UNIT/ML injection USE PER SLIDING SCALE AT EACH MEAL (AVG USE IS 17 UNITS THREE TIMES DAILY) 50 mL 2  . insulin NPH Human (NOVOLIN N) 100 UNIT/ML injection 65 U SQ qAM, 65 U SQ mid day, and 61 U SQ q evening (Patient taking differently: Inject 60 Units into the skin 3 (three) times daily. ) 50 mL 5  . Insulin Syringes, Disposable, U-100 0.3 ML MISC Use to inject insulin--6 injections per day 540 each 3  . levocetirizine (XYZAL) 5 MG tablet Take 5 mg by mouth at bedtime as needed for allergies.   5  . levothyroxine (SYNTHROID, LEVOTHROID) 125 MCG tablet TAKE 1 TABLET BY MOUTH EVERY MORNING ON AN EMPTY STOMACH 90 tablet 0  . lidocaine-prilocaine (EMLA) cream Apply to affected area once 30 g 3  . meloxicam (MOBIC) 15 MG tablet TAKE 1 TABLET BY MOUTH EVERY DAY AS NEEDED 30 tablet 3  . metFORMIN (GLUCOPHAGE) 500 MG tablet TAKE 1 TABLET BY MOUTH EVERY MORNING AND 2 TABLETS BY MOUTH AT BEDTIME 270 tablet 1  . miconazole (MICRO GUARD) 2 % powder Apply 1 application topically 2 (two) times daily as needed for itching.    . montelukast (SINGULAIR) 10 MG tablet Take 10 mg by mouth every evening.     . ondansetron (ZOFRAN) 8 MG tablet Take 1 tablet (8 mg total) by mouth every 8 (eight) hours as needed for refractory nausea / vomiting.  Start on day 3 after chemo. 30 tablet 1  . pravastatin (PRAVACHOL) 20 MG tablet TAKE 1 TABLET BY MOUTH EVERY DAY 90 tablet 1  . Probiotic Product (PROBIOTIC PO) Take 1 capsule by mouth daily.    . prochlorperazine (COMPAZINE) 10 MG tablet Take 1 tablet (10 mg total) by mouth every 6 (six) hours as needed (Nausea or vomiting). 30 tablet 1  . senna-docusate (SENOKOT-S) 8.6-50 MG tablet Take 2 tablets by mouth at bedtime. Hold if having loose stools 30 tablet 1  . SYMBICORT 160-4.5 MCG/ACT inhaler Inhale 2 puffs into the lungs 2 (two) times daily.    . VENTOLIN HFA 108 (90 Base) MCG/ACT inhaler INHALE 2 PUFFS INTO LUNGS EVERY 6 HOURS AS NEEDED FOR WHEEZING/SHORTNESS OF BREATH 18 Inhaler 0   No current facility-administered medications for this visit.    Facility-Administered Medications Ordered in Other Visits  Medication Dose Route Frequency Provider Last Rate Last Dose  . CARBOplatin (PARAPLATIN) 750 mg in sodium chloride 0.9 % 250 mL chemo infusion  750 mg Intravenous Once Alvy Bimler, Jeter Tomey, MD      . famotidine (PEPCID) 20  mg in sodium chloride 0.9 % 100 mL IVPB  20 mg Intravenous Once Alvy Bimler, Shaheen Star, MD 408 mL/hr at 06/19/18 1051 20 mg at 06/19/18 1051  . fosaprepitant (EMEND) 150 mg, dexamethasone (DECADRON) 6 mg in sodium chloride 0.9 % 145 mL IVPB   Intravenous Once Alvy Bimler, Ayaansh Smail, MD      . heparin lock flush 100 unit/mL  500 Units Intracatheter Once PRN Alvy Bimler, Kinslee Dalpe, MD      . PACLitaxel (TAXOL) 330 mg in sodium chloride 0.9 % 500 mL chemo infusion (> 66m/m2)  140 mg/m2 (Treatment Plan Recorded) Intravenous Once Kalliope Riesen, MD      . sodium chloride flush (NS) 0.9 % injection 10 mL  10 mL Intracatheter PRN GAlvy Bimler Meshulem Onorato, MD        PHYSICAL EXAMINATION: ECOG PERFORMANCE STATUS: 2 - Symptomatic, <50% confined to bed  Vitals:   06/19/18 0941  BP: (!) 167/56  Pulse: 100  Resp: 19  Temp: 98.4 F (36.9 C)  SpO2: 96%   Filed Weights   06/19/18 0941  Weight: 275 lb (124.7 kg)    GENERAL:alert,  no distress and comfortable SKIN: skin color, texture, turgor are normal, no rashes or significant lesions EYES: normal, Conjunctiva are pink and non-injected, sclera clear OROPHARYNX:no exudate, no erythema and lips, buccal mucosa, and tongue normal  NECK: supple, thyroid normal size, non-tender, without nodularity LYMPH:  no palpable lymphadenopathy in the cervical, axillary or inguinal LUNGS: clear to auscultation and percussion with normal breathing effort HEART: regular rate & rhythm and no murmurs and no lower extremity edema ABDOMEN:abdomen soft, non-tender and normal bowel sounds Musculoskeletal:no cyanosis of digits and no clubbing  NEURO: alert & oriented x 3 with fluent speech, no focal motor/sensory deficits  LABORATORY DATA:  I have reviewed the data as listed    Component Value Date/Time   NA 140 06/19/2018 0906   K 4.6 06/19/2018 0906   CL 103 06/19/2018 0906   CO2 22 06/19/2018 0906   GLUCOSE 292 (H) 06/19/2018 0906   BUN 23 06/19/2018 0906   CREATININE 0.92 06/19/2018 0906   CALCIUM 9.0 06/19/2018 0906   PROT 7.6 06/19/2018 0906   ALBUMIN 3.3 (L) 06/19/2018 0906   AST 30 06/19/2018 0906   ALT 26 06/19/2018 0906   ALKPHOS 72 06/19/2018 0906   BILITOT 0.4 06/19/2018 0906   GFRNONAA >60 06/19/2018 0906   GFRAA >60 06/19/2018 0906    No results found for: SPEP, UPEP  Lab Results  Component Value Date   WBC 5.1 06/19/2018   NEUTROABS 4.2 06/19/2018   HGB 8.4 (L) 06/19/2018   HCT 27.1 (L) 06/19/2018   MCV 104.2 (H) 06/19/2018   PLT 109 (L) 06/19/2018      Chemistry      Component Value Date/Time   NA 140 06/19/2018 0906   K 4.6 06/19/2018 0906   CL 103 06/19/2018 0906   CO2 22 06/19/2018 0906   BUN 23 06/19/2018 0906   CREATININE 0.92 06/19/2018 0906      Component Value Date/Time   CALCIUM 9.0 06/19/2018 0906   ALKPHOS 72 06/19/2018 0906   AST 30 06/19/2018 0906   ALT 26 06/19/2018 0906   BILITOT 0.4 06/19/2018 0906       All questions  were answered. The patient knows to call the clinic with any problems, questions or concerns. No barriers to learning was detected.  I spent 15 minutes counseling the patient face to face. The total time spent in the appointment was 20  minutes and more than 50% was on counseling and review of test results  Heath Lark, MD 06/19/2018 11:05 AM

## 2018-06-19 NOTE — Assessment & Plan Note (Signed)
She is mildly symptomatic with fatigue We will proceed with treatment today I anticipate she might need transfusion support with her hemoglobin trending down We discussed the risk and benefits of transfusion support next week but the patient declined I recommend delaying her treatment 1 more week for her final treatment to allow bone marrow recovering before her next cycle

## 2018-06-19 NOTE — Assessment & Plan Note (Signed)
she has mild peripheral neuropathy, likely related to side effects of treatment. It is only mild, not bothering the patient. I will observe for now If it gets worse in the future, I will consider modifying the dose of the treatment  

## 2018-06-19 NOTE — Assessment & Plan Note (Signed)
She has poorly controlled diabetes I plan to omit oral premed dexamethasone in the morning of treatment and continue to work with the patient to reduce carbohydrate intake around the time of treatment Her blood sugar level is high and she will receive a dose of subcutaneous insulin during treatment

## 2018-06-19 NOTE — Telephone Encounter (Signed)
Scheduled appt per 3/10 sch message - left message with appt date and time and sent reminder letter in the mail

## 2018-06-19 NOTE — Assessment & Plan Note (Signed)
The patient will complete radiation treatment recently She is noted to have progressive pancytopenia We will proceed with treatment with similar dose adjustment We discussed the risk and benefits of transfusion support versus waiting an extra week for her next cycle of treatment

## 2018-06-19 NOTE — Patient Instructions (Signed)
[facility] Discharge Instructions for Patients Receiving Chemotherapy  Today you received the following chemotherapy agents Taxol/Carboplatin  To help prevent nausea and vomiting after your treatment, we encourage you to take your nausea medication as prescribed.   If you develop nausea and vomiting that is not controlled by your nausea medication, call the clinic.   BELOW ARE SYMPTOMS THAT SHOULD BE REPORTED IMMEDIATELY:  *FEVER GREATER THAN 100.5 F  *CHILLS WITH OR WITHOUT FEVER  NAUSEA AND VOMITING THAT IS NOT CONTROLLED WITH YOUR NAUSEA MEDICATION  *UNUSUAL SHORTNESS OF BREATH  *UNUSUAL BRUISING OR BLEEDING  TENDERNESS IN MOUTH AND THROAT WITH OR WITHOUT PRESENCE OF ULCERS  *URINARY PROBLEMS  *BOWEL PROBLEMS  UNUSUAL RASH Items with * indicate a potential emergency and should be followed up as soon as possible.  Feel free to call the clinic should you have any questions or concerns. The clinic phone number is [phone].  Please show the CHEMO ALERT CARD at check-in to the Emergency Department and triage nurse.   

## 2018-06-25 ENCOUNTER — Other Ambulatory Visit: Payer: Self-pay | Admitting: Family Medicine

## 2018-06-25 NOTE — Telephone Encounter (Signed)
Copied from [place] [id number]. Topic: Quick Communication - Rx Refill/Question >> Jun 25, 2018  4:35 PM Loma [place] wrote: CRM for notification. See Telephone encounter for: 06/25/18. Disp Refills Start End  HYDROcodone-acetaminophen (NORCO/VICODIN) 5-325 MG tablet 60 tablet   [id number] [address], [place] - [address] [address] Phone: [phone] Fax: [phone]

## 2018-06-26 NOTE — Telephone Encounter (Signed)
Norco refill request  Dr. Anitra Lauth

## 2018-06-26 NOTE — Telephone Encounter (Signed)
RF request for hydro/apap LOV: 05/18/18 Next ov: 08/16/18 Last written: 05/18/18 #60 w/ 0RF  Please advise. Thanks.

## 2018-06-28 MED ORDER — HYDROCODONE-ACETAMINOPHEN 5-325 MG PO TABS: ORAL_TABLET | ORAL | 0 refills | Status: DC

## 2018-07-05 ENCOUNTER — Other Ambulatory Visit: Payer: Self-pay | Admitting: Family Medicine

## 2018-07-08 ENCOUNTER — Other Ambulatory Visit: Payer: Self-pay | Admitting: Family Medicine

## 2018-07-12 ENCOUNTER — Telehealth: Payer: Self-pay | Admitting: Oncology

## 2018-07-12 NOTE — Telephone Encounter (Signed)
[name] called and wanted to make sure her chemotherapy appointment on 07/17/18 was still scheduled with the Covid-19 situation.  Advised her that it is still scheduled and discussed that she can wear a mask if she wants to and that she will not be able to have any family members with her.  She also asked if she will have a follow up with radiation oncology.  Advised her that I will check and call her back.

## 2018-07-15 ENCOUNTER — Encounter: Payer: Self-pay | Admitting: Family Medicine

## 2018-07-17 ENCOUNTER — Inpatient Hospital Stay: Payer: Medicare Other | Attending: Obstetrics

## 2018-07-17 ENCOUNTER — Inpatient Hospital Stay: Payer: Medicare Other

## 2018-07-17 ENCOUNTER — Inpatient Hospital Stay (HOSPITAL_BASED_OUTPATIENT_CLINIC_OR_DEPARTMENT_OTHER): Payer: Medicare Other | Admitting: Hematology and Oncology

## 2018-07-17 ENCOUNTER — Encounter: Payer: Self-pay | Admitting: Hematology and Oncology

## 2018-07-17 ENCOUNTER — Other Ambulatory Visit: Payer: Self-pay

## 2018-07-17 VITALS — BP 154/46 | HR 95 | Temp 98.6°F | Resp 18 | Ht 61.0 in | Wt 268.5 lb

## 2018-07-17 DIAGNOSIS — C541 Malignant neoplasm of endometrium: Secondary | ICD-10-CM

## 2018-07-17 DIAGNOSIS — G620 Drug-induced polyneuropathy: Secondary | ICD-10-CM | POA: Insufficient documentation

## 2018-07-17 DIAGNOSIS — Z923 Personal history of irradiation: Secondary | ICD-10-CM | POA: Insufficient documentation

## 2018-07-17 DIAGNOSIS — T451X5S Adverse effect of antineoplastic and immunosuppressive drugs, sequela: Secondary | ICD-10-CM

## 2018-07-17 DIAGNOSIS — Z5111 Encounter for antineoplastic chemotherapy: Secondary | ICD-10-CM | POA: Diagnosis not present

## 2018-07-17 DIAGNOSIS — D61818 Other pancytopenia: Secondary | ICD-10-CM | POA: Insufficient documentation

## 2018-07-17 DIAGNOSIS — R5383 Other fatigue: Secondary | ICD-10-CM | POA: Diagnosis not present

## 2018-07-17 DIAGNOSIS — T451X5A Adverse effect of antineoplastic and immunosuppressive drugs, initial encounter: Secondary | ICD-10-CM

## 2018-07-17 LAB — CMP (CANCER CENTER ONLY)
ALT: 28 U/L (ref 0–44)
AST: 38 U/L (ref 15–41)
Albumin: 3.4 g/dL — ABNORMAL LOW (ref 3.5–5.0)
Alkaline Phosphatase: 71 U/L (ref 38–126)
Anion gap: 14 (ref 5–15)
BUN: 23 mg/dL (ref 8–23)
CO2: 24 mmol/L (ref 22–32)
Calcium: 8.5 mg/dL — ABNORMAL LOW (ref 8.9–10.3)
Chloride: 102 mmol/L (ref 98–111)
Creatinine: 0.92 mg/dL (ref 0.44–1.00)
GFR, Est AFR Am: >60 mL/min (ref >60)
GFR, Estimated: >60 mL/min (ref >60)
Glucose, Bld: 212 mg/dL — ABNORMAL HIGH (ref 70–99)
Potassium: 4.2 mmol/L (ref 3.5–5.1)
Sodium: 140 mmol/L (ref 135–145)
Total Bilirubin: 0.4 mg/dL (ref 0.3–1.2)
Total Protein: 7.9 g/dL (ref 6.5–8.1)

## 2018-07-17 LAB — CBC WITH DIFFERENTIAL/PLATELET
Abs Immature Granulocytes: 0.27 10*3/uL — ABNORMAL HIGH (ref 0.00–0.07)
Basophils Absolute: 0 10*3/uL (ref 0.0–0.1)
Basophils Relative: 0 %
Eosinophils Absolute: 0 10*3/uL (ref 0.0–0.5)
Eosinophils Relative: 0 %
HCT: 26.1 % — ABNORMAL LOW (ref 36.0–46.0)
Hemoglobin: 8.2 g/dL — ABNORMAL LOW (ref 12.0–15.0)
Immature Granulocytes: 5 %
Lymphocytes Relative: 12 %
Lymphs Abs: 0.7 10*3/uL (ref 0.7–4.0)
MCH: 34.6 pg — ABNORMAL HIGH (ref 26.0–34.0)
MCHC: 31.4 g/dL (ref 30.0–36.0)
MCV: 110.1 fL — ABNORMAL HIGH (ref 80.0–100.0)
Monocytes Absolute: 0.2 10*3/uL (ref 0.1–1.0)
Monocytes Relative: 4 %
Neutro Abs: 4.3 10*3/uL (ref 1.7–7.7)
Neutrophils Relative %: 79 %
Platelets: 125 10*3/uL — ABNORMAL LOW (ref 150–400)
RBC: 2.37 MIL/uL — ABNORMAL LOW (ref 3.87–5.11)
RDW: 18.4 % — ABNORMAL HIGH (ref 11.5–15.5)
WBC: 5.5 10*3/uL (ref 4.0–10.5)
nRBC: 0.4 % — ABNORMAL HIGH (ref 0.0–0.2)

## 2018-07-17 MED ORDER — SODIUM CHLORIDE 0.9 % IV SOLN
750.0000 mg | Freq: Once | INTRAVENOUS | Status: AC
  Administered 2018-07-17: 750 mg via INTRAVENOUS
  Filled 2018-07-17: qty 75

## 2018-07-17 MED ORDER — HEPARIN SOD (PORK) LOCK FLUSH 100 UNIT/ML IV SOLN
500.0000 [IU] | Freq: Once | INTRAVENOUS | Status: AC | PRN
  Administered 2018-07-17: 500 [IU]
  Filled 2018-07-17: qty 5

## 2018-07-17 MED ORDER — FAMOTIDINE IN NACL 20-0.9 MG/50ML-% IV SOLN: 20.0000 mg | Freq: Once | INTRAVENOUS | Status: DC

## 2018-07-17 MED ORDER — DIPHENHYDRAMINE HCL 50 MG/ML IJ SOLN
INTRAMUSCULAR | Status: AC
  Filled 2018-07-17: qty 1

## 2018-07-17 MED ORDER — SODIUM CHLORIDE 0.9 % IV SOLN
Freq: Once | INTRAVENOUS | Status: AC
  Administered 2018-07-17: 12:00:00 via INTRAVENOUS
  Filled 2018-07-17: qty 250

## 2018-07-17 MED ORDER — SODIUM CHLORIDE 0.9% FLUSH
10.0000 mL | Freq: Once | INTRAVENOUS | Status: AC
  Administered 2018-07-17: 10 mL
  Filled 2018-07-17: qty 10

## 2018-07-17 MED ORDER — DIPHENHYDRAMINE HCL 50 MG/ML IJ SOLN
50.0000 mg | Freq: Once | INTRAMUSCULAR | Status: AC
  Administered 2018-07-17: 12:00:00 50 mg via INTRAVENOUS

## 2018-07-17 MED ORDER — SODIUM CHLORIDE 0.9 % IV SOLN
Freq: Once | INTRAVENOUS | Status: AC
  Administered 2018-07-17: 13:00:00 via INTRAVENOUS
  Filled 2018-07-17: qty 5

## 2018-07-17 MED ORDER — SODIUM CHLORIDE 0.9 % IV SOLN
20.0000 mg | Freq: Once | INTRAVENOUS | Status: AC
  Administered 2018-07-17: 13:00:00 20 mg via INTRAVENOUS
  Filled 2018-07-17: qty 2

## 2018-07-17 MED ORDER — PALONOSETRON HCL INJECTION 0.25 MG/5ML
INTRAVENOUS | Status: AC
  Filled 2018-07-17: qty 5

## 2018-07-17 MED ORDER — PALONOSETRON HCL INJECTION 0.25 MG/5ML
0.2500 mg | Freq: Once | INTRAVENOUS | Status: AC
  Administered 2018-07-17: 12:00:00 0.25 mg via INTRAVENOUS

## 2018-07-17 MED ORDER — SODIUM CHLORIDE 0.9 % IV SOLN
140.0000 mg/m2 | Freq: Once | INTRAVENOUS | Status: AC
  Administered 2018-07-17: 330 mg via INTRAVENOUS
  Filled 2018-07-17: qty 55

## 2018-07-17 MED ORDER — SODIUM CHLORIDE 0.9% FLUSH
10.0000 mL | INTRAVENOUS | Status: DC | PRN
  Administered 2018-07-17: 18:00:00 10 mL
  Filled 2018-07-17: qty 10

## 2018-07-17 NOTE — Patient Instructions (Signed)

## 2018-07-17 NOTE — Progress Notes (Signed)
[place] OFFICE PROGRESS NOTE  Patient Care Team: Tammi Sou, MD as PCP - General (Family Medicine) Harold Hedge, Darrick Grinder, MD as Consulting Physician (Allergy and Immunology) Juanita Craver, MD as Consulting Physician (Gastroenterology) Martinique, Peter M, MD as Consulting Physician (Cardiology) Megan Salon, MD as Consulting Physician (Gynecology) Isabel Caprice, MD as Consulting Physician (Gynecologic Oncology) Everitt Amber, MD as Consulting Physician (Gynecologic Oncology) Heath Lark, MD as Consulting Physician (Hematology and Oncology)  ASSESSMENT & PLAN:  Endometrial cancer [facility] [place]) She is noted to have progressive pancytopenia but overall asymptomatic We will proceed with treatment with similar dose adjustment I plan to recheck imaging study in a month to make sure previously noted lymphadenopathy has completely resolved   Peripheral neuropathy due to chemotherapy [facility] she has mild peripheral neuropathy, likely related to side effects of treatment. It is only mild, not bothering the patient. I will observe for now  Pancytopenia, acquired [facility]) She is mildly symptomatic with fatigue We will proceed with treatment today   Orders Placed This Encounter  Procedures  . CT ABDOMEN PELVIS W CONTRAST    Standing Status:   Future    Standing Expiration Date:   07/17/2019    Order Specific Question:   If indicated for the ordered procedure, I authorize the administration of contrast media per Radiology protocol    Answer:   Yes    Order Specific Question:   Preferred imaging location?    Answer:   [facility]    Order Specific Question:   Radiology Contrast Protocol - do NOT remove file path    Answer:   _[email or web address]    INTERVAL HISTORY: Please see below for problem oriented charting. She returns for cycle 6 of chemotherapy She complained of mild fatigue recently but has improved She continues to have mild  intermittent neuropathy but it does not bother her No recent infection, fever or chills No nausea or recent changes in bowel habits The patient denies any recent signs or symptoms of bleeding such as spontaneous epistaxis, hematuria or hematochezia.  SUMMARY OF ONCOLOGIC HISTORY: Oncology History   Endometrioid MSI     Endometrial cancer ([place])   01/24/2018 Initial Diagnosis    Patient was seen by GYN for PMB since July 2019.  Pt has hx of complex endometrial hyperplasia without atypica that was treated with Mirena IUD for 5 years.  This was removed about two years ago.  She did have follow up biopsies after placement and did have resolution of hyperplasia.    Patient's last menstrual period was 08/10/2010.    01/25/2018 Pathology Results    Endometrium, biopsy - ENDOMETRIOID ADENOCARCINOMA, FIGO GRADE 1. - SEE COMMENT.    01/25/2018 Procedure    She had endometrial biopsy in GYN office    01/25/2018 Imaging    US pelvis  UTERUS: 8.5 x 5.3 x 4.5cm EMS: 18-35m, areas of vascular flow noted as well ADNEXA: Left ovary: not seen transvaginally or transabdominally due to habitus                  Right ovary: same as with left ovary CUL DE SAC: no free fluid     02/19/2018 Pathology Results    Uterus +/- tubes/ovaries, neoplastic, cervix - UTERUS: -ENDO/MYOMETRIUM: INVASIVE ENDOMETRIOID ADENOCARCINOMA WITH FOCAL SQUAMOUS DIFFERENTIATION (FIGO GRADE 1), SPANNING 5.8 CM. TUMOR INVADES LESS THAN ONE HALF OF THE MYOMETRIUM. TUMOR INVOLVES CERVICAL STROMA AND BILATERAL FALLOPIAN TUBE LUMEN. SEE ONCOLOGY TABLE. -SEROSA: UNREMARKABLE. NO MALIGNANCY. -  CERVIX: STROMAL INVOLVEMENT BY ENDOMETRIOID ADENOCARCINOMA. - BILATERAL OVARIES: INCLUSION CYSTS. NO MALIGNANCY. - BILATERAL FALLOPIAN TUBES: LUMEN INVOLVEMENT BY ENDOMETRIOID ADENOCARCINOMA. Microscopic Comment UTERUS, CARCINOMA OR CARCINOSARCOMA Procedure: Total hysterectomy with bilateral salpingo-oophorectomy. Histologic type:  Endometrioid adenocarcinoma with squamous differentiation. Histologic Grade: FIGO grade 1. Myometrial invasion: Depth of invasion: 3 mm Myometrial thickness: 17 mm Uterine Serosa Involvement: Not identified. Cervical stromal involvement: Present. Extent of involvement of other organs: Involves cervical stroma and lumen of bilateral fallopian tubes. Lymphovascular invasion: Not identified. Regional Lymph Nodes: None examined. Tumor block for ancillary studies: 1D-G MMR / MSI testing: Will be ordered.    02/19/2018 Surgery    Surgeon: Donaciano Eva   Operation: Robotic-assisted laparoscopic total hysterectomy with bilateral salpingoophorectomy (22 modifier for extreme obesity - see details in body of operative note)   Operative Findings:  : extreme intraperitoneal adiposity which prevented retroperitoneal visualization of the lymph node basins and increased the duration of the procedure by 1 hour (for additional instrumentation, placement of retraction sutures, additional personnel required for assistance.  8cm uterus, grossly normal ovaries, surgically interrupted tubes    03/05/2018 Imaging    1. Upper normal pelvic sidewall lymph nodes bilaterally. Close attention on follow-up recommended. 2. No other findings to suggest metastatic disease in the chest, abdomen, or pelvis. 3.  Aortic Atherosclerois (ICD10-170.0)     03/12/2018 Cancer Staging    Staging form: Corpus Uteri - Carcinoma and Carcinosarcoma, AJCC 8th Edition - Pathologic: Stage III (pT3, pN0, cM0) - Signed by Heath Lark, MD on 03/12/2018    03/16/2018 Procedure    Ultrasound and fluoroscopically guided right internal jugular single lumen power port catheter insertion. Tip in the SVC/RA junction. Catheter ready for use.    03/20/2018 -  Chemotherapy    The patient had carboplatin and taxol    04/24/2018 - 05/21/2018 Radiation Therapy    Radiation treatment dates:   04/24/18, 05/03/18, 05/07/18, 05/16/18,  05/21/18  Site/dose:  Vaginal cuff, 6 Gy in 5 fractions for a total dose of 30 Gy  Beams/energy:   HDR Ir-Vaginal, Iridium HDR,patient was treated with a 2.5 cm diameter segmented cylinder, Rxlength of 3 cm, prescription was 6 gray to the mucosal surface      REVIEW OF SYSTEMS:   Constitutional: Denies fevers, chills or abnormal weight loss Eyes: Denies blurriness of vision Ears, nose, mouth, throat, and face: Denies mucositis or sore throat Respiratory: Denies cough, dyspnea or wheezes Cardiovascular: Denies palpitation, chest discomfort or lower extremity swelling Gastrointestinal:  Denies nausea, heartburn or change in bowel habits Skin: Denies abnormal skin rashes Lymphatics: Denies new lymphadenopathy or easy bruising Behavioral/Psych: Mood is stable, no new changes  All other systems were reviewed with the patient and are negative.  I have reviewed the past medical history, past surgical history, social history and family history with the patient and they are unchanged from previous note.  ALLERGIES:  is allergic to adhesive [tape]; banana; eggs or egg-derived products; latex; penicillins; pine; and rose.  MEDICATIONS:  Current Outpatient Medications  Medication Sig Dispense Refill  . albuterol (PROVENTIL) (2.5 MG/3ML) 0.083% nebulizer solution Take 3 mLs (2.5 mg total) by nebulization every 4 (four) hours as needed for wheezing or shortness of breath (Dx: J45.50). 75 mL 1  . clobetasol ointment (TEMOVATE) 0.05 % APPLY TO AFFECTED AREA TWICE A DAY (Patient taking differently: Apply 1 application topically 2 (two) times daily as needed (for irritation). ) 60 g 1  . dexamethasone (DECADRON) 4 MG tablet Take 2 tabs  at the night before and 2 tab the morning of chemotherapy, every 3 weeks, by mouth 24 tablet 0  . felodipine (PLENDIL) 10 MG 24 hr tablet TAKE 1 TABLET BY MOUTH EVERY DAY 90 tablet 1  . fluticasone (CUTIVATE) 0.05 % cream APPLY TO AFFECTED AREA OF R LOWER LEG TWICE  PER DAY 30 g 0  . fluticasone (FLONASE SENSIMIST) 27.5 MCG/SPRAY nasal spray Place 1 spray into the nose daily as needed for rhinitis.    . furosemide (LASIX) 20 MG tablet Take 1 tablet (20 mg total) by mouth daily. 90 tablet 1  . HYDROcodone-acetaminophen (NORCO/VICODIN) 5-325 MG tablet 1-2 tabs po qd prn pain 90 tablet 0  . insulin aspart (NOVOLOG) 100 UNIT/ML injection USE PER SLIDING SCALE AT EACH MEAL (AVG USE IS 17 UNITS THREE TIMES DAILY) 50 mL 2  . insulin NPH Human (NOVOLIN N) 100 UNIT/ML injection 65 U SQ qAM, 65 U SQ mid day, and 61 U SQ q evening (Patient taking differently: Inject 60 Units into the skin 3 (three) times daily. ) 50 mL 5  . Insulin Syringes, Disposable, U-100 0.3 ML MISC Use to inject insulin--6 injections per day 540 each 3  . levocetirizine (XYZAL) 5 MG tablet Take 5 mg by mouth at bedtime as needed for allergies.   5  . levothyroxine (SYNTHROID, LEVOTHROID) 125 MCG tablet TAKE 1 TABLET BY MOUTH EVERY MORNING ON AN EMPTY STOMACH 90 tablet 0  . lidocaine-prilocaine (EMLA) cream Apply to affected area once 30 g 3  . meloxicam (MOBIC) 15 MG tablet TAKE 1 TABLET BY MOUTH EVERY DAY AS NEEDED 30 tablet 3  . metFORMIN (GLUCOPHAGE) 500 MG tablet TAKE 1 TABLET BY MOUTH EVERY MORNING AND 2 TABLETS BY MOUTH AT BEDTIME 270 tablet 1  . miconazole (MICRO GUARD) 2 % powder Apply 1 application topically 2 (two) times daily as needed for itching.    . montelukast (SINGULAIR) 10 MG tablet Take 10 mg by mouth every evening.     . ondansetron (ZOFRAN) 8 MG tablet Take 1 tablet (8 mg total) by mouth every 8 (eight) hours as needed for refractory nausea / vomiting. Start on day 3 after chemo. 30 tablet 1  . pravastatin (PRAVACHOL) 20 MG tablet TAKE 1 TABLET BY MOUTH EVERY DAY 90 tablet 1  . Probiotic Product (PROBIOTIC PO) Take 1 capsule by mouth daily.    . prochlorperazine (COMPAZINE) 10 MG tablet Take 1 tablet (10 mg total) by mouth every 6 (six) hours as needed (Nausea or vomiting). 30  tablet 1  . senna-docusate (SENOKOT-S) 8.6-50 MG tablet Take 2 tablets by mouth at bedtime. Hold if having loose stools 30 tablet 1  . SYMBICORT 160-4.5 MCG/ACT inhaler Inhale 2 puffs into the lungs 2 (two) times daily.    . VENTOLIN HFA 108 (90 Base) MCG/ACT inhaler INHALE 2 PUFFS INTO LUNGS EVERY 6 HOURS AS NEEDED FOR WHEEZING/SHORTNESS OF BREATH 18 Inhaler 0   No current facility-administered medications for this visit.    Facility-Administered Medications Ordered in Other Visits  Medication Dose Route Frequency Provider Last Rate Last Dose  . CARBOplatin (PARAPLATIN) 750 mg in sodium chloride 0.9 % 250 mL chemo infusion  750 mg Intravenous Once Alvy Bimler, Ni, MD      . heparin lock flush 100 unit/mL  500 Units Intracatheter Once PRN Alvy Bimler, Ni, MD      . PACLitaxel (TAXOL) 330 mg in sodium chloride 0.9 % 500 mL chemo infusion (> 2m/m2)  140 mg/m2 (Treatment Plan Recorded)  Intravenous Once Heath Lark, MD 185 mL/hr at 07/17/18 1357 330 mg at 07/17/18 1357  . sodium chloride flush (NS) 0.9 % injection 10 mL  10 mL Intracatheter PRN Alvy Bimler, Ni, MD        PHYSICAL EXAMINATION: ECOG PERFORMANCE STATUS: 1 - Symptomatic but completely ambulatory  Vitals:   07/17/18 1105  BP: (!) 154/46  Pulse: 95  Resp: 18  Temp: 98.6 F (37 C)  SpO2: 98%   Filed Weights   07/17/18 1105  Weight: 268 lb 8 oz (121.8 kg)    GENERAL:alert, no distress and comfortable SKIN: skin color, texture, turgor are normal, no rashes or significant lesions EYES: normal, Conjunctiva are pink and non-injected, sclera clear OROPHARYNX:no exudate, no erythema and lips, buccal mucosa, and tongue normal  NECK: supple, thyroid normal size, non-tender, without nodularity LYMPH:  no palpable lymphadenopathy in the cervical, axillary or inguinal LUNGS: clear to auscultation and percussion with normal breathing effort HEART: regular rate & rhythm and no murmurs and no lower extremity edema ABDOMEN:abdomen soft,  non-tender and normal bowel sounds Musculoskeletal:no cyanosis of digits and no clubbing  NEURO: alert & oriented x 3 with fluent speech, no focal motor/sensory deficits  LABORATORY DATA:  I have reviewed the data as listed    Component Value Date/Time   NA 140 07/17/2018 1028   K 4.2 07/17/2018 1028   CL 102 07/17/2018 1028   CO2 24 07/17/2018 1028   GLUCOSE 212 (H) 07/17/2018 1028   BUN 23 07/17/2018 1028   CREATININE 0.92 07/17/2018 1028   CALCIUM 8.5 (L) 07/17/2018 1028   PROT 7.9 07/17/2018 1028   ALBUMIN 3.4 (L) 07/17/2018 1028   AST 38 07/17/2018 1028   ALT 28 07/17/2018 1028   ALKPHOS 71 07/17/2018 1028   BILITOT 0.4 07/17/2018 1028   GFRNONAA >60 07/17/2018 1028   GFRAA >60 07/17/2018 1028    No results found for: SPEP, UPEP  Lab Results  Component Value Date   WBC 5.5 07/17/2018   NEUTROABS 4.3 07/17/2018   HGB 8.2 (L) 07/17/2018   HCT 26.1 (L) 07/17/2018   MCV 110.1 (H) 07/17/2018   PLT 125 (L) 07/17/2018      Chemistry      Component Value Date/Time   NA 140 07/17/2018 1028   K 4.2 07/17/2018 1028   CL 102 07/17/2018 1028   CO2 24 07/17/2018 1028   BUN 23 07/17/2018 1028   CREATININE 0.92 07/17/2018 1028      Component Value Date/Time   CALCIUM 8.5 (L) 07/17/2018 1028   ALKPHOS 71 07/17/2018 1028   AST 38 07/17/2018 1028   ALT 28 07/17/2018 1028   BILITOT 0.4 07/17/2018 1028      All questions were answered. The patient knows to call the clinic with any problems, questions or concerns. No barriers to learning was detected.  I spent 15 minutes counseling the patient face to face. The total time spent in the appointment was 20 minutes and more than 50% was on counseling and review of test results  Heath Lark, MD 07/17/2018 3:49 PM

## 2018-07-17 NOTE — Assessment & Plan Note (Signed)
She is noted to have progressive pancytopenia but overall asymptomatic We will proceed with treatment with similar dose adjustment I plan to recheck imaging study in a month to make sure previously noted lymphadenopathy has completely resolved

## 2018-07-17 NOTE — Patient Instructions (Signed)
[facility] Discharge Instructions for Patients Receiving Chemotherapy  Today you received the following chemotherapy agents Taxol/Carboplatin  To help prevent nausea and vomiting after your treatment, we encourage you to take your nausea medication as prescribed.   If you develop nausea and vomiting that is not controlled by your nausea medication, call the clinic.   BELOW ARE SYMPTOMS THAT SHOULD BE REPORTED IMMEDIATELY:  *FEVER GREATER THAN 100.5 F  *CHILLS WITH OR WITHOUT FEVER  NAUSEA AND VOMITING THAT IS NOT CONTROLLED WITH YOUR NAUSEA MEDICATION  *UNUSUAL SHORTNESS OF BREATH  *UNUSUAL BRUISING OR BLEEDING  TENDERNESS IN MOUTH AND THROAT WITH OR WITHOUT PRESENCE OF ULCERS  *URINARY PROBLEMS  *BOWEL PROBLEMS  UNUSUAL RASH Items with * indicate a potential emergency and should be followed up as soon as possible.  Feel free to call the clinic should you have any questions or concerns. The clinic phone number is [phone].  Please show the CHEMO ALERT CARD at check-in to the Emergency Department and triage nurse.   

## 2018-07-17 NOTE — Assessment & Plan Note (Signed)
she has mild peripheral neuropathy, likely related to side effects of treatment. It is only mild, not bothering the patient. I will observe for now 

## 2018-07-17 NOTE — Assessment & Plan Note (Signed)
She is mildly symptomatic with fatigue We will proceed with treatment today

## 2018-07-18 ENCOUNTER — Telehealth: Payer: Self-pay | Admitting: Hematology and Oncology

## 2018-07-18 NOTE — Telephone Encounter (Signed)
Tried to reach regarding 5/11

## 2018-07-26 ENCOUNTER — Other Ambulatory Visit: Payer: Self-pay

## 2018-07-26 MED ORDER — FLUTICASONE PROPIONATE 0.05 % EX CREA: TOPICAL_CREAM | CUTANEOUS | 0 refills | Status: DC

## 2018-08-09 ENCOUNTER — Telehealth: Payer: Self-pay | Admitting: Oncology

## 2018-08-09 NOTE — Telephone Encounter (Signed)
[name] called and asked where she needs to pick up the contrast for her CT next week.  Advised her that she can pick it up at the [facility] front desk or in the Radiology waiting room.  She verbalized agreement.

## 2018-08-10 ENCOUNTER — Telehealth: Payer: Self-pay | Admitting: Hematology and Oncology

## 2018-08-10 NOTE — Telephone Encounter (Signed)
Spoke with patient re 5/7 appointments.

## 2018-08-16 ENCOUNTER — Ambulatory Visit (HOSPITAL_COMMUNITY): Payer: Medicare Other

## 2018-08-16 ENCOUNTER — Inpatient Hospital Stay: Payer: Medicare Other | Attending: Obstetrics

## 2018-08-16 ENCOUNTER — Inpatient Hospital Stay: Payer: Medicare Other

## 2018-08-16 ENCOUNTER — Ambulatory Visit: Payer: Medicare Other | Admitting: Family Medicine

## 2018-08-16 ENCOUNTER — Ambulatory Visit (HOSPITAL_COMMUNITY)
Admission: RE | Admit: 2018-08-16 | Discharge: 2018-08-16 | Disposition: A | Discharge status: Home / Self Care | Payer: Medicare Other | Source: Ambulatory Visit | Attending: Hematology and Oncology | Admitting: Hematology and Oncology

## 2018-08-16 ENCOUNTER — Other Ambulatory Visit: Payer: Self-pay

## 2018-08-16 DIAGNOSIS — Z7951 Long term (current) use of inhaled steroids: Secondary | ICD-10-CM | POA: Insufficient documentation

## 2018-08-16 DIAGNOSIS — C541 Malignant neoplasm of endometrium: Secondary | ICD-10-CM | POA: Insufficient documentation

## 2018-08-16 DIAGNOSIS — Z794 Long term (current) use of insulin: Secondary | ICD-10-CM | POA: Diagnosis not present

## 2018-08-16 DIAGNOSIS — T451X5D Adverse effect of antineoplastic and immunosuppressive drugs, subsequent encounter: Secondary | ICD-10-CM | POA: Insufficient documentation

## 2018-08-16 DIAGNOSIS — D61818 Other pancytopenia: Secondary | ICD-10-CM | POA: Insufficient documentation

## 2018-08-16 DIAGNOSIS — G620 Drug-induced polyneuropathy: Secondary | ICD-10-CM | POA: Insufficient documentation

## 2018-08-16 DIAGNOSIS — Z791 Long term (current) use of non-steroidal anti-inflammatories (NSAID): Secondary | ICD-10-CM | POA: Insufficient documentation

## 2018-08-16 DIAGNOSIS — Z79899 Other long term (current) drug therapy: Secondary | ICD-10-CM | POA: Diagnosis not present

## 2018-08-16 LAB — COMPREHENSIVE METABOLIC PANEL
ALT: 26 U/L (ref 0–44)
AST: 36 U/L (ref 15–41)
Albumin: 3.4 g/dL — ABNORMAL LOW (ref 3.5–5.0)
Alkaline Phosphatase: 76 U/L (ref 38–126)
Anion gap: 13 (ref 5–15)
BUN: 23 mg/dL (ref 8–23)
CO2: 27 mmol/L (ref 22–32)
Calcium: 8.7 mg/dL — ABNORMAL LOW (ref 8.9–10.3)
Chloride: 99 mmol/L (ref 98–111)
Creatinine, Ser: 0.9 mg/dL (ref 0.44–1.00)
GFR calc Af Amer: >60 mL/min (ref >60)
GFR calc non Af Amer: >60 mL/min (ref >60)
Glucose, Bld: 149 mg/dL — ABNORMAL HIGH (ref 70–99)
Potassium: 4 mmol/L (ref 3.5–5.1)
Sodium: 139 mmol/L (ref 135–145)
Total Bilirubin: 0.3 mg/dL (ref 0.3–1.2)
Total Protein: 8 g/dL (ref 6.5–8.1)

## 2018-08-16 LAB — CBC WITH DIFFERENTIAL/PLATELET
Abs Immature Granulocytes: 0.1 10*3/uL — ABNORMAL HIGH (ref 0.00–0.07)
Basophils Absolute: 0 10*3/uL (ref 0.0–0.1)
Basophils Relative: 1 %
Eosinophils Absolute: 0.3 10*3/uL (ref 0.0–0.5)
Eosinophils Relative: 4 %
HCT: 26.7 % — ABNORMAL LOW (ref 36.0–46.0)
Hemoglobin: 8.3 g/dL — ABNORMAL LOW (ref 12.0–15.0)
Immature Granulocytes: 2 %
Lymphocytes Relative: 16 %
Lymphs Abs: 1 10*3/uL (ref 0.7–4.0)
MCH: 34.7 pg — ABNORMAL HIGH (ref 26.0–34.0)
MCHC: 31.1 g/dL (ref 30.0–36.0)
MCV: 111.7 fL — ABNORMAL HIGH (ref 80.0–100.0)
Monocytes Absolute: 0.8 10*3/uL (ref 0.1–1.0)
Monocytes Relative: 13 %
Neutro Abs: 4 10*3/uL (ref 1.7–7.7)
Neutrophils Relative %: 64 %
Platelets: 103 10*3/uL — ABNORMAL LOW (ref 150–400)
RBC: 2.39 MIL/uL — ABNORMAL LOW (ref 3.87–5.11)
RDW: 16.9 % — ABNORMAL HIGH (ref 11.5–15.5)
WBC: 6.2 10*3/uL (ref 4.0–10.5)
nRBC: 0 % (ref 0.0–0.2)

## 2018-08-16 MED ORDER — SODIUM CHLORIDE 0.9% FLUSH
10.0000 mL | Freq: Once | INTRAVENOUS | Status: AC
  Administered 2018-08-16: 10 mL
  Filled 2018-08-16: qty 10

## 2018-08-16 MED ORDER — IOHEXOL 300 MG/ML  SOLN
100.0000 mL | Freq: Once | INTRAMUSCULAR | Status: AC | PRN
  Administered 2018-08-16: 13:00:00 100 mL via INTRAVENOUS

## 2018-08-16 MED ORDER — HEPARIN SOD (PORK) LOCK FLUSH 100 UNIT/ML IV SOLN
250.0000 [IU] | Freq: Once | INTRAVENOUS | Status: DC
  Filled 2018-08-16: qty 5

## 2018-08-16 MED ORDER — SODIUM CHLORIDE (PF) 0.9 % IJ SOLN
INTRAMUSCULAR | Status: AC
  Filled 2018-08-16: qty 50

## 2018-08-16 MED ORDER — HEPARIN SOD (PORK) LOCK FLUSH 100 UNIT/ML IV SOLN
INTRAVENOUS | Status: AC
  Filled 2018-08-16: qty 5

## 2018-08-16 MED ORDER — HEPARIN SOD (PORK) LOCK FLUSH 100 UNIT/ML IV SOLN
500.0000 [IU] | Freq: Once | INTRAVENOUS | Status: AC
  Administered 2018-08-16: 13:00:00 500 [IU] via INTRAVENOUS

## 2018-08-17 ENCOUNTER — Ambulatory Visit (HOSPITAL_COMMUNITY): Admission: RE | Admit: 2018-08-17 | Payer: Medicare Other | Source: Ambulatory Visit

## 2018-08-20 ENCOUNTER — Encounter: Payer: Self-pay | Admitting: Hematology and Oncology

## 2018-08-20 ENCOUNTER — Ambulatory Visit
Admission: RE | Admit: 2018-08-20 | Discharge: 2018-08-20 | Disposition: A | Discharge status: Home / Self Care | Payer: Medicare Other | Source: Ambulatory Visit | Attending: Radiation Oncology | Admitting: Radiation Oncology

## 2018-08-20 ENCOUNTER — Other Ambulatory Visit: Payer: Self-pay

## 2018-08-20 ENCOUNTER — Inpatient Hospital Stay (HOSPITAL_BASED_OUTPATIENT_CLINIC_OR_DEPARTMENT_OTHER): Payer: Medicare Other | Admitting: Hematology and Oncology

## 2018-08-20 ENCOUNTER — Other Ambulatory Visit: Payer: Self-pay | Admitting: Hematology and Oncology

## 2018-08-20 ENCOUNTER — Encounter: Payer: Self-pay | Admitting: Radiation Oncology

## 2018-08-20 VITALS — BP 150/51 | HR 94 | Temp 98.8°F | Resp 18 | Wt 264.8 lb

## 2018-08-20 DIAGNOSIS — Z9221 Personal history of antineoplastic chemotherapy: Secondary | ICD-10-CM | POA: Diagnosis not present

## 2018-08-20 DIAGNOSIS — T451X5A Adverse effect of antineoplastic and immunosuppressive drugs, initial encounter: Secondary | ICD-10-CM

## 2018-08-20 DIAGNOSIS — F329 Major depressive disorder, single episode, unspecified: Secondary | ICD-10-CM | POA: Diagnosis not present

## 2018-08-20 DIAGNOSIS — Z7951 Long term (current) use of inhaled steroids: Secondary | ICD-10-CM | POA: Diagnosis not present

## 2018-08-20 DIAGNOSIS — Z79899 Other long term (current) drug therapy: Secondary | ICD-10-CM

## 2018-08-20 DIAGNOSIS — T451X5D Adverse effect of antineoplastic and immunosuppressive drugs, subsequent encounter: Secondary | ICD-10-CM | POA: Diagnosis not present

## 2018-08-20 DIAGNOSIS — R197 Diarrhea, unspecified: Secondary | ICD-10-CM | POA: Insufficient documentation

## 2018-08-20 DIAGNOSIS — M129 Arthropathy, unspecified: Secondary | ICD-10-CM | POA: Insufficient documentation

## 2018-08-20 DIAGNOSIS — Z794 Long term (current) use of insulin: Secondary | ICD-10-CM

## 2018-08-20 DIAGNOSIS — Z791 Long term (current) use of non-steroidal anti-inflammatories (NSAID): Secondary | ICD-10-CM | POA: Diagnosis not present

## 2018-08-20 DIAGNOSIS — G620 Drug-induced polyneuropathy: Secondary | ICD-10-CM

## 2018-08-20 DIAGNOSIS — Z90710 Acquired absence of both cervix and uterus: Secondary | ICD-10-CM | POA: Diagnosis not present

## 2018-08-20 DIAGNOSIS — Z923 Personal history of irradiation: Secondary | ICD-10-CM | POA: Diagnosis not present

## 2018-08-20 DIAGNOSIS — R309 Painful micturition, unspecified: Secondary | ICD-10-CM | POA: Insufficient documentation

## 2018-08-20 DIAGNOSIS — D61818 Other pancytopenia: Secondary | ICD-10-CM

## 2018-08-20 DIAGNOSIS — C541 Malignant neoplasm of endometrium: Secondary | ICD-10-CM | POA: Diagnosis not present

## 2018-08-20 DIAGNOSIS — Z08 Encounter for follow-up examination after completed treatment for malignant neoplasm: Secondary | ICD-10-CM | POA: Diagnosis not present

## 2018-08-20 DIAGNOSIS — K5730 Diverticulosis of large intestine without perforation or abscess without bleeding: Secondary | ICD-10-CM | POA: Diagnosis not present

## 2018-08-20 NOTE — Assessment & Plan Note (Signed)
She has mild residual peripheral neuropathy from prior treatment We discussed trial of gabapentin but the patient declined

## 2018-08-20 NOTE — Assessment & Plan Note (Signed)
I have reviewed imaging study and test results with the patient She has no evidence of disease We discussed port removal versus keeping it patent and she would like it removed She has appointment to see radiation oncologist today I will set up appointment for her to see her GYN surgeon in 3 months

## 2018-08-20 NOTE — Progress Notes (Signed)
Radiation Oncology         [phone] ________________________________  Name: [name] MRN: [id number]  Date: 08/20/2018  DOB: 06-10-1950  Follow-Up Visit Note  CC: McGowen, Adrian Blackwater, MD  Everitt Amber, MD    ICD-10-CM   1. Endometrial cancer ([place]) C54.1     Diagnosis:   Stage IIIA endometroid endometrial carcinoma      Interval Since Last Radiation: 3 months  Radiation treatment dates:   04/24/18, 05/03/18, 05/07/18, 05/16/18, 05/21/18  Site/dose:  Vaginal cuff, 6 Gy in 5 fractions for a total dose of 30 Gy  Narrative:  The patient returns today for routine follow-up.  she is doing well overall. She met with Dr. Alvy Bimler today who set her up to see Dr. Denman George in 3 months.   Since they were last seen in the office, they had a CT abdomen/pelvis with contrast on 08/16/18 that showed s/p hysterectomy. No CT evidence of metastatic disease in the abdomen or pelvis. Unchanged appearance of subcentimeter pelvic sidewall lymph nodes.                 On review of systems, she reports Pt reports arthritis pain and neuropathy pain, generalized and rated 8.5/10. Pt reports occasional burning with urination and is drinking diet cranberry juice daily. Pt also reports a "diaper rash" feeling in labia area. Pt describes this as yeast. Pt denies vaginal bleeding/discharge. Pt denies hematuria. Pt denies rectal bleeding. Pt reports diarrhea and attributes this to chemotherapy. Pertinent positives are listed and detailed within the above HPI.  She has been using her vaginal dilator ~ 2 times per week.  She reports no bleeding with dilator use.                 ALLERGIES:  is allergic to adhesive [tape]; banana; eggs or egg-derived products; latex; penicillins; pine; and rose.  Meds: Current Outpatient Medications  Medication Sig Dispense Refill   albuterol (PROVENTIL) (2.5 MG/3ML) 0.083% nebulizer solution Take 3 mLs (2.5 mg total) by nebulization every 4 (four) hours as needed for wheezing or  shortness of breath (Dx: J45.50). 75 mL 1   clobetasol ointment (TEMOVATE) 0.05 % APPLY TO AFFECTED AREA TWICE A DAY (Patient taking differently: Apply 1 application topically 2 (two) times daily as needed (for irritation). ) 60 g 1   felodipine (PLENDIL) 10 MG 24 hr tablet TAKE 1 TABLET BY MOUTH EVERY DAY 90 tablet 1   fluticasone (CUTIVATE) 0.05 % cream APPLY TO AFFECTED AREA OF R LOWER LEG TWICE PER DAY. 30 g 0   fluticasone (FLONASE SENSIMIST) 27.5 MCG/SPRAY nasal spray Place 1 spray into the nose daily as needed for rhinitis.     furosemide (LASIX) 20 MG tablet Take 1 tablet (20 mg total) by mouth daily. 90 tablet 1   HYDROcodone-acetaminophen (NORCO/VICODIN) 5-325 MG tablet 1-2 tabs po qd prn pain 90 tablet 0   insulin aspart (NOVOLOG) 100 UNIT/ML injection USE PER SLIDING SCALE AT EACH MEAL (AVG USE IS 17 UNITS THREE TIMES DAILY) 50 mL 2   insulin NPH Human (NOVOLIN N) 100 UNIT/ML injection 65 U SQ qAM, 65 U SQ mid day, and 61 U SQ q evening (Patient taking differently: Inject 60 Units into the skin 3 (three) times daily. ) 50 mL 5   Insulin Syringes, Disposable, U-100 0.3 ML MISC Use to inject insulin--6 injections per day 540 each 3   levocetirizine (XYZAL) 5 MG tablet Take 5 mg by mouth at bedtime as needed for  allergies.   5   levothyroxine (SYNTHROID, LEVOTHROID) 125 MCG tablet TAKE 1 TABLET BY MOUTH EVERY MORNING ON AN EMPTY STOMACH 90 tablet 0   meloxicam (MOBIC) 15 MG tablet TAKE 1 TABLET BY MOUTH EVERY DAY AS NEEDED 30 tablet 3   metFORMIN (GLUCOPHAGE) 500 MG tablet TAKE 1 TABLET BY MOUTH EVERY MORNING AND 2 TABLETS BY MOUTH AT BEDTIME 270 tablet 1   miconazole (MICRO GUARD) 2 % powder Apply 1 application topically 2 (two) times daily as needed for itching.     montelukast (SINGULAIR) 10 MG tablet Take 10 mg by mouth every evening.      pravastatin (PRAVACHOL) 20 MG tablet TAKE 1 TABLET BY MOUTH EVERY DAY 90 tablet 1   Probiotic Product (PROBIOTIC PO) Take 1 capsule  by mouth daily.     senna-docusate (SENOKOT-S) 8.6-50 MG tablet Take 2 tablets by mouth at bedtime. Hold if having loose stools 30 tablet 1   SYMBICORT 160-4.5 MCG/ACT inhaler Inhale 2 puffs into the lungs 2 (two) times daily.     VENTOLIN HFA 108 (90 Base) MCG/ACT inhaler INHALE 2 PUFFS INTO LUNGS EVERY 6 HOURS AS NEEDED FOR WHEEZING/SHORTNESS OF BREATH 18 Inhaler 0   No current facility-administered medications for this encounter.     Physical Findings: The patient is in no acute distress. Patient is alert and oriented.  weight is 264 lb 12.4 oz (120.1 kg). Her oral temperature is 98.8 F (37.1 C). Her blood pressure is 150/51 (abnormal) and her pulse is 94. Her respiration is 18 and oxygen saturation is 98%. .  No significant changes. Lungs are clear to auscultation bilaterally. Heart has regular rate and rhythm. No palpable cervical, supraclavicular, or axillary adenopathy. Abdomen soft, non-tender, normal bowel sounds. On pelvic examination the external genitalia were unremarkable.  No signs of yeast infection in the labia or vaginal area. A speculum exam was performed. There are no mucosal lesions noted in the vaginal vault. . On bimanual  examination there were no pelvic masses appreciated.   Lab Findings: Lab Results  Component Value Date   WBC 6.2 08/16/2018   HGB 8.3 (L) 08/16/2018   HCT 26.7 (L) 08/16/2018   MCV 111.7 (H) 08/16/2018   PLT 103 (L) 08/16/2018    Radiographic Findings: Ct Abdomen Pelvis W Contrast  Result Date: 08/16/2018 CLINICAL DATA:  Follow-up endometrial cancer EXAM: CT ABDOMEN AND PELVIS WITH CONTRAST TECHNIQUE: Multidetector CT imaging of the abdomen and pelvis was performed using the standard protocol following bolus administration of intravenous contrast. CONTRAST:  118m OMNIPAQUE IOHEXOL 300 MG/ML SOLN, additional oral enteric contrast COMPARISON:  03/05/2018 FINDINGS: Lower chest: No acute abnormality. Hepatobiliary: No focal liver abnormality is  seen. Status post cholecystectomy. No biliary dilatation. Pancreas: Unremarkable. No pancreatic ductal dilatation or surrounding inflammatory changes. Spleen: Normal in size without focal abnormality. Adrenals/Urinary Tract: Adrenal glands are unremarkable. Kidneys are normal, without renal calculi, focal lesion, or hydronephrosis. Bladder is unremarkable. Stomach/Bowel: Stomach is within normal limits. Appendix appears normal. No evidence of bowel wall thickening, distention, or inflammatory changes. Sigmoid diverticulosis. Vascular/Lymphatic: No significant vascular findings are present. No enlarged abdominal or pelvic lymph nodes. Unchanged appearance of subcentimeter pelvic sidewall lymph nodes (e.g. series 2, image 69). Reproductive: Status post hysterectomy. Other: No abdominal wall hernia or abnormality. No abdominopelvic ascites. Musculoskeletal: Pars defects of L5 with minimal degenerative anterolisthesis. IMPRESSION: 1. Status post hysterectomy. No CT evidence of metastatic disease in the abdomen or pelvis. Unchanged appearance of subcentimeter pelvic sidewall lymph nodes. 2.  Other chronic, incidental, and postoperative findings as detailed above. Electronically Signed   By: Eddie Candle M.D.   On: 08/16/2018 16:35    Impression: No evidence of recurrence on clinical exam today and recent CT scans. She is happy to complete her therapy.  Plan: She will follow-up in 6 months in radiation oncology.  she will see Dr. Denman George in 3 months.  ____________________________________   Blair Promise, PhD, MD    This document serves as a record of services personally performed by Gery Pray, MD. It was created on his behalf by Mary-Margaret Loma Messing, a trained medical scribe. The creation of this record is based on the scribe's personal observations and the provider's statements to them. This document has been checked and approved by the attending provider.

## 2018-08-20 NOTE — Progress Notes (Signed)
Pt presents today for f/u with Dr. Sondra Come. Pt reports arthritis pain and neuropathy pain, generalized and rated 8.5/10. Pt reports occasional burning with urination and is drinking diet cranberry juice daily. Pt also reports a "diaper rash" feeling in labia area. Pt describes this as yeast. Pt denies vaginal bleeding/discharge. Pt denies hematuria. Pt denies rectal bleeding. Pt reports diarrhea and attributes this to chemotherapy.   BP (!) 150/51 (BP Location: Right Arm, Patient Position: Sitting)   Pulse 94   Temp 98.8 F (37.1 C) (Oral)   Resp 18   Wt 264 lb 12.4 oz (120.1 kg)   LMP 08/10/2010 Comment: Hysterectomy 02/02/18  SpO2 98%   BMI 50.03 kg/m   Wt Readings from Last 3 Encounters:  08/20/18 264 lb 12.4 oz (120.1 kg)  08/20/18 264 lb 12.8 oz (120.1 kg)  07/17/18 268 lb 8 oz (121.8 kg)   Loma Sousa, RN BSN

## 2018-08-20 NOTE — Assessment & Plan Note (Signed)
She has persistent pancytopenia from recent treatment I suspect it will improve over the course of the next few months I recommend follow-up appointment with her primary care doctor in a few months to recheck CBC

## 2018-08-20 NOTE — Progress Notes (Signed)
[facility] OFFICE PROGRESS NOTE  Patient Care Team: Tammi Sou, MD as PCP - General (Family Medicine) Harold Hedge, Darrick Grinder, MD as Consulting Physician (Allergy and Immunology) Juanita Craver, MD as Consulting Physician (Gastroenterology) Martinique, Peter M, MD as Consulting Physician (Cardiology) Megan Salon, MD as Consulting Physician (Gynecology) Isabel Caprice, MD as Consulting Physician (Gynecologic Oncology) Everitt Amber, MD as Consulting Physician (Gynecologic Oncology) Heath Lark, MD as Consulting Physician (Hematology and Oncology)  ASSESSMENT & PLAN:  Endometrial cancer [facility]) I have reviewed imaging study and test results with the patient She has no evidence of disease We discussed port removal versus keeping it patent and she would like it removed She has appointment to see radiation oncologist today I will set up appointment for her to see her GYN surgeon in 3 months  Peripheral neuropathy due to chemotherapy [facility]) She has mild residual peripheral neuropathy from prior treatment We discussed trial of gabapentin but the patient declined  Pancytopenia, acquired [facility]) She has persistent pancytopenia from recent treatment I suspect it will improve over the course of the next few months I recommend follow-up appointment with her primary care doctor in a few months to recheck CBC   Orders Placed This Encounter  Procedures  . IR REMOVAL TUN ACCESS W/ PORT W/O FL MOD SED    Standing Status:   Future    Standing Expiration Date:   10/20/2019    Order Specific Question:   Reason for exam:    Answer:   no need port, finished chemo    Order Specific Question:   Preferred Imaging Location?    Answer:   [facility]    INTERVAL HISTORY: Please see below for problem oriented charting. She returns for further follow-up She developed severe cramps and diarrhea after this recent CT scan Her symptoms has resolved She has mild residual peripheral  neuropathy from treatment but it is not debilitating She denies recent infection, fever or chills No recent nausea  SUMMARY OF ONCOLOGIC HISTORY: Oncology History   Endometrioid MSI stable     Endometrial cancer ([place])   01/24/2018 Initial Diagnosis    Patient was seen by GYN for PMB since July 2019.  Pt has hx of complex endometrial hyperplasia without atypica that was treated with Mirena IUD for 5 years.  This was removed about two years ago.  She did have follow up biopsies after placement and did have resolution of hyperplasia.    Patient's last menstrual period was 08/10/2010.    01/25/2018 Pathology Results    Endometrium, biopsy - ENDOMETRIOID ADENOCARCINOMA, FIGO GRADE 1. - SEE COMMENT.    01/25/2018 Procedure    She had endometrial biopsy in GYN office    01/25/2018 Imaging    US pelvis  UTERUS: 8.5 x 5.3 x 4.5cm EMS: 18-51m, areas of vascular flow noted as well ADNEXA: Left ovary: not seen transvaginally or transabdominally due to habitus                  Right ovary: same as with left ovary CUL DE SAC: no free fluid     02/19/2018 Pathology Results    Uterus +/- tubes/ovaries, neoplastic, cervix - UTERUS: -ENDO/MYOMETRIUM: INVASIVE ENDOMETRIOID ADENOCARCINOMA WITH FOCAL SQUAMOUS DIFFERENTIATION (FIGO GRADE 1), SPANNING 5.8 CM. TUMOR INVADES LESS THAN ONE HALF OF THE MYOMETRIUM. TUMOR INVOLVES CERVICAL STROMA AND BILATERAL FALLOPIAN TUBE LUMEN. SEE ONCOLOGY TABLE. -SEROSA: UNREMARKABLE. NO MALIGNANCY. - CERVIX: STROMAL INVOLVEMENT BY ENDOMETRIOID ADENOCARCINOMA. - BILATERAL OVARIES: INCLUSION  CYSTS. NO MALIGNANCY. - BILATERAL FALLOPIAN TUBES: LUMEN INVOLVEMENT BY ENDOMETRIOID ADENOCARCINOMA. Microscopic Comment UTERUS, CARCINOMA OR CARCINOSARCOMA Procedure: Total hysterectomy with bilateral salpingo-oophorectomy. Histologic type: Endometrioid adenocarcinoma with squamous differentiation. Histologic Grade: FIGO grade 1. Myometrial invasion: Depth of  invasion: 3 mm Myometrial thickness: 17 mm Uterine Serosa Involvement: Not identified. Cervical stromal involvement: Present. Extent of involvement of other organs: Involves cervical stroma and lumen of bilateral fallopian tubes. Lymphovascular invasion: Not identified. Regional Lymph Nodes: None examined. Tumor block for ancillary studies: 1D-G MMR / MSI testing: Will be ordered.    02/19/2018 Surgery    Surgeon: Donaciano Eva   Operation: Robotic-assisted laparoscopic total hysterectomy with bilateral salpingoophorectomy (22 modifier for extreme obesity - see details in body of operative note)   Operative Findings:  : extreme intraperitoneal adiposity which prevented retroperitoneal visualization of the lymph node basins and increased the duration of the procedure by 1 hour (for additional instrumentation, placement of retraction sutures, additional personnel required for assistance.  8cm uterus, grossly normal ovaries, surgically interrupted tubes    03/05/2018 Imaging    1. Upper normal pelvic sidewall lymph nodes bilaterally. Close attention on follow-up recommended. 2. No other findings to suggest metastatic disease in the chest, abdomen, or pelvis. 3.  Aortic Atherosclerois (ICD10-170.0)     03/12/2018 Cancer Staging    Staging form: Corpus Uteri - Carcinoma and Carcinosarcoma, AJCC 8th Edition - Pathologic: Stage III (pT3, pN0, cM0) - Signed by Heath Lark, MD on 03/12/2018    03/16/2018 Procedure    Ultrasound and fluoroscopically guided right internal jugular single lumen power port catheter insertion. Tip in the SVC/RA junction. Catheter ready for use.    03/20/2018 - 07/17/2018 Chemotherapy    The patient had carboplatin and taxol x 6 cycles    04/24/2018 - 05/21/2018 Radiation Therapy    Radiation treatment dates:   04/24/18, 05/03/18, 05/07/18, 05/16/18, 05/21/18  Site/dose:  Vaginal cuff, 6 Gy in 5 fractions for a total dose of 30 Gy  Beams/energy:   HDR  Ir-Vaginal, Iridium HDR,patient was treated with a 2.5 cm diameter segmented cylinder, Rxlength of 3 cm, prescription was 6 gray to the mucosal surface     08/16/2018 Imaging    1. Status post hysterectomy. No CT evidence of metastatic disease in the abdomen or pelvis. Unchanged appearance of subcentimeter pelvic sidewall lymph nodes.  2. Other chronic, incidental, and postoperative findings as detailed above.     REVIEW OF SYSTEMS:   Constitutional: Denies fevers, chills or abnormal weight loss Eyes: Denies blurriness of vision Ears, nose, mouth, throat, and face: Denies mucositis or sore throat Respiratory: Denies cough, dyspnea or wheezes Cardiovascular: Denies palpitation, chest discomfort or lower extremity swelling Skin: Denies abnormal skin rashes Lymphatics: Denies new lymphadenopathy or easy bruising Behavioral/Psych: Mood is stable, no new changes  All other systems were reviewed with the patient and are negative.  I have reviewed the past medical history, past surgical history, social history and family history with the patient and they are unchanged from previous note.  ALLERGIES:  is allergic to adhesive [tape]; banana; eggs or egg-derived products; latex; penicillins; pine; and rose.  MEDICATIONS:  Current Outpatient Medications  Medication Sig Dispense Refill  . albuterol (PROVENTIL) (2.5 MG/3ML) 0.083% nebulizer solution Take 3 mLs (2.5 mg total) by nebulization every 4 (four) hours as needed for wheezing or shortness of breath (Dx: J45.50). 75 mL 1  . clobetasol ointment (TEMOVATE) 0.05 % APPLY TO AFFECTED AREA TWICE A DAY (Patient taking differently: Apply  1 application topically 2 (two) times daily as needed (for irritation). ) 60 g 1  . felodipine (PLENDIL) 10 MG 24 hr tablet TAKE 1 TABLET BY MOUTH EVERY DAY 90 tablet 1  . fluticasone (CUTIVATE) 0.05 % cream APPLY TO AFFECTED AREA OF R LOWER LEG TWICE PER DAY. 30 g 0  . fluticasone (FLONASE SENSIMIST) 27.5  MCG/SPRAY nasal spray Place 1 spray into the nose daily as needed for rhinitis.    . furosemide (LASIX) 20 MG tablet Take 1 tablet (20 mg total) by mouth daily. 90 tablet 1  . HYDROcodone-acetaminophen (NORCO/VICODIN) 5-325 MG tablet 1-2 tabs po qd prn pain 90 tablet 0  . insulin aspart (NOVOLOG) 100 UNIT/ML injection USE PER SLIDING SCALE AT EACH MEAL (AVG USE IS 17 UNITS THREE TIMES DAILY) 50 mL 2  . insulin NPH Human (NOVOLIN N) 100 UNIT/ML injection 65 U SQ qAM, 65 U SQ mid day, and 61 U SQ q evening (Patient taking differently: Inject 60 Units into the skin 3 (three) times daily. ) 50 mL 5  . Insulin Syringes, Disposable, U-100 0.3 ML MISC Use to inject insulin--6 injections per day 540 each 3  . levocetirizine (XYZAL) 5 MG tablet Take 5 mg by mouth at bedtime as needed for allergies.   5  . levothyroxine (SYNTHROID, LEVOTHROID) 125 MCG tablet TAKE 1 TABLET BY MOUTH EVERY MORNING ON AN EMPTY STOMACH 90 tablet 0  . meloxicam (MOBIC) 15 MG tablet TAKE 1 TABLET BY MOUTH EVERY DAY AS NEEDED 30 tablet 3  . metFORMIN (GLUCOPHAGE) 500 MG tablet TAKE 1 TABLET BY MOUTH EVERY MORNING AND 2 TABLETS BY MOUTH AT BEDTIME 270 tablet 1  . miconazole (MICRO GUARD) 2 % powder Apply 1 application topically 2 (two) times daily as needed for itching.    . montelukast (SINGULAIR) 10 MG tablet Take 10 mg by mouth every evening.     . pravastatin (PRAVACHOL) 20 MG tablet TAKE 1 TABLET BY MOUTH EVERY DAY 90 tablet 1  . Probiotic Product (PROBIOTIC PO) Take 1 capsule by mouth daily.    Marland Kitchen senna-docusate (SENOKOT-S) 8.6-50 MG tablet Take 2 tablets by mouth at bedtime. Hold if having loose stools 30 tablet 1  . SYMBICORT 160-4.5 MCG/ACT inhaler Inhale 2 puffs into the lungs 2 (two) times daily.    . VENTOLIN HFA 108 (90 Base) MCG/ACT inhaler INHALE 2 PUFFS INTO LUNGS EVERY 6 HOURS AS NEEDED FOR WHEEZING/SHORTNESS OF BREATH 18 Inhaler 0   No current facility-administered medications for this visit.     PHYSICAL  EXAMINATION: ECOG PERFORMANCE STATUS: 2 - Symptomatic, <50% confined to bed  Vitals:   08/20/18 1030  BP: (!) 161/64  Pulse: (!) 101  Resp: 17  Temp: 98.8 F (37.1 C)  SpO2: 98%   Filed Weights   08/20/18 1030  Weight: 264 lb 12.8 oz (120.1 kg)    GENERAL:alert, no distress and comfortable Musculoskeletal:no cyanosis of digits and no clubbing  NEURO: alert & oriented x 3 with fluent speech, no focal motor/sensory deficits  LABORATORY DATA:  I have reviewed the data as listed    Component Value Date/Time   NA 139 08/16/2018 1141   K 4.0 08/16/2018 1141   CL 99 08/16/2018 1141   CO2 27 08/16/2018 1141   GLUCOSE 149 (H) 08/16/2018 1141   BUN 23 08/16/2018 1141   CREATININE 0.90 08/16/2018 1141   CREATININE 0.92 07/17/2018 1028   CALCIUM 8.7 (L) 08/16/2018 1141   PROT 8.0 08/16/2018 1141  ALBUMIN 3.4 (L) 08/16/2018 1141   AST 36 08/16/2018 1141   AST 38 07/17/2018 1028   ALT 26 08/16/2018 1141   ALT 28 07/17/2018 1028   ALKPHOS 76 08/16/2018 1141   BILITOT 0.3 08/16/2018 1141   BILITOT 0.4 07/17/2018 1028   GFRNONAA >60 08/16/2018 1141   GFRNONAA >60 07/17/2018 1028   GFRAA >60 08/16/2018 1141   GFRAA >60 07/17/2018 1028    No results found for: SPEP, UPEP  Lab Results  Component Value Date   WBC 6.2 08/16/2018   NEUTROABS 4.0 08/16/2018   HGB 8.3 (L) 08/16/2018   HCT 26.7 (L) 08/16/2018   MCV 111.7 (H) 08/16/2018   PLT 103 (L) 08/16/2018      Chemistry      Component Value Date/Time   NA 139 08/16/2018 1141   K 4.0 08/16/2018 1141   CL 99 08/16/2018 1141   CO2 27 08/16/2018 1141   BUN 23 08/16/2018 1141   CREATININE 0.90 08/16/2018 1141   CREATININE 0.92 07/17/2018 1028      Component Value Date/Time   CALCIUM 8.7 (L) 08/16/2018 1141   ALKPHOS 76 08/16/2018 1141   AST 36 08/16/2018 1141   AST 38 07/17/2018 1028   ALT 26 08/16/2018 1141   ALT 28 07/17/2018 1028   BILITOT 0.3 08/16/2018 1141   BILITOT 0.4 07/17/2018 1028        RADIOGRAPHIC STUDIES: I have personally reviewed the radiological images as listed and agreed with the findings in the report. Ct Abdomen Pelvis W Contrast  Result Date: 08/16/2018 CLINICAL DATA:  Follow-up endometrial cancer EXAM: CT ABDOMEN AND PELVIS WITH CONTRAST TECHNIQUE: Multidetector CT imaging of the abdomen and pelvis was performed using the standard protocol following bolus administration of intravenous contrast. CONTRAST:  180m OMNIPAQUE IOHEXOL 300 MG/ML SOLN, additional oral enteric contrast COMPARISON:  03/05/2018 FINDINGS: Lower chest: No acute abnormality. Hepatobiliary: No focal liver abnormality is seen. Status post cholecystectomy. No biliary dilatation. Pancreas: Unremarkable. No pancreatic ductal dilatation or surrounding inflammatory changes. Spleen: Normal in size without focal abnormality. Adrenals/Urinary Tract: Adrenal glands are unremarkable. Kidneys are normal, without renal calculi, focal lesion, or hydronephrosis. Bladder is unremarkable. Stomach/Bowel: Stomach is within normal limits. Appendix appears normal. No evidence of bowel wall thickening, distention, or inflammatory changes. Sigmoid diverticulosis. Vascular/Lymphatic: No significant vascular findings are present. No enlarged abdominal or pelvic lymph nodes. Unchanged appearance of subcentimeter pelvic sidewall lymph nodes (e.g. series 2, image 69). Reproductive: Status post hysterectomy. Other: No abdominal wall hernia or abnormality. No abdominopelvic ascites. Musculoskeletal: Pars defects of L5 with minimal degenerative anterolisthesis. IMPRESSION: 1. Status post hysterectomy. No CT evidence of metastatic disease in the abdomen or pelvis. Unchanged appearance of subcentimeter pelvic sidewall lymph nodes. 2. Other chronic, incidental, and postoperative findings as detailed above. Electronically Signed   By: AEddie CandleM.D.   On: 08/16/2018 16:35    All questions were answered. The patient knows to call the  clinic with any problems, questions or concerns. No barriers to learning was detected.  I spent 15 minutes counseling the patient face to face. The total time spent in the appointment was 20 minutes and more than 50% was on counseling and review of test results  NHeath Lark MD 08/20/2018 10:52 AM

## 2018-08-20 NOTE — Patient Instructions (Signed)
Coronavirus (COVID-19) Are you at risk?  Are you at risk for the Coronavirus (COVID-19)?  To be considered HIGH RISK for Coronavirus (COVID-19), you have to meet the following criteria:  . Traveled to [place], [place], [place], [place] or [place]; or in the [place] to [place], [place], [place], or [place]; and have fever, cough, and shortness of breath within the last 2 weeks of travel OR . Been in close contact with a person diagnosed with COVID-19 within the last 2 weeks and have fever, cough, and shortness of breath . IF YOU DO NOT MEET THESE CRITERIA, YOU ARE CONSIDERED LOW RISK FOR COVID-19.  What to do if you are HIGH RISK for COVID-19?  . If you are having a medical emergency, call 911. . Seek medical care right away. Before you go to a doctor's office, urgent care or emergency department, call ahead and tell them about your recent travel, contact with someone diagnosed with COVID-19, and your symptoms. You should receive instructions from your physician's office regarding next steps of care.  . When you arrive at healthcare provider, tell the healthcare staff immediately you have returned from visiting [place], [place], [place], [place] or [place]; or traveled in the [place] to [place], [place], [place], or [place]; in the last two weeks or you have been in close contact with a person diagnosed with COVID-19 in the last 2 weeks.   . Tell the health care staff about your symptoms: fever, cough and shortness of breath. . After you have been seen by a medical provider, you will be either: o Tested for (COVID-19) and discharged home on quarantine except to seek medical care if symptoms worsen, and asked to  - Stay home and avoid contact with others until you get your results (4-5 days)  - Avoid travel on public transportation if possible (such as bus, train, or airplane) or o Sent to the Emergency Department by EMS for evaluation, COVID-19 testing, and possible  admission depending on your condition and test results.  What to do if you are LOW RISK for COVID-19?  Reduce your risk of any infection by using the same precautions used for avoiding the common cold or flu:  . Wash your hands often with soap and warm water for at least 20 seconds.  If soap and water are not readily available, use an alcohol-based hand sanitizer with at least 60% alcohol.  . If coughing or sneezing, cover your mouth and nose by coughing or sneezing into the elbow areas of your shirt or coat, into a tissue or into your sleeve (not your hands). . Avoid shaking hands with others and consider head nods or verbal greetings only. . Avoid touching your eyes, nose, or mouth with unwashed hands.  . Avoid close contact with people who are sick. . Avoid places or events with large numbers of people in one location, like concerts or sporting events. . Carefully consider travel plans you have or are making. . If you are planning any travel outside or inside the [place], visit the CDC's Travelers' Health webpage for the latest health notices. . If you have some symptoms but not all symptoms, continue to monitor at home and seek medical attention if your symptoms worsen. . If you are having a medical emergency, call 911.   ADDITIONAL HEALTHCARE OPTIONS FOR PATIENTS   Telehealth / e-Visit: [email or web address]         [facility]: [phone]     Urgent Care: [phone]                   [facility]: [phone]   

## 2018-08-22 ENCOUNTER — Encounter: Payer: Self-pay | Admitting: Family Medicine

## 2018-08-22 ENCOUNTER — Other Ambulatory Visit: Payer: Self-pay

## 2018-08-22 ENCOUNTER — Ambulatory Visit (INDEPENDENT_AMBULATORY_CARE_PROVIDER_SITE_OTHER): Payer: Medicare Other | Admitting: Family Medicine

## 2018-08-22 VITALS — BP 125/56 | HR 80 | Temp 98.4°F | Resp 16 | Wt 264.0 lb

## 2018-08-22 DIAGNOSIS — I10 Essential (primary) hypertension: Secondary | ICD-10-CM | POA: Diagnosis not present

## 2018-08-22 DIAGNOSIS — D61810 Antineoplastic chemotherapy induced pancytopenia: Secondary | ICD-10-CM | POA: Diagnosis not present

## 2018-08-22 DIAGNOSIS — E118 Type 2 diabetes mellitus with unspecified complications: Secondary | ICD-10-CM

## 2018-08-22 DIAGNOSIS — T451X5A Adverse effect of antineoplastic and immunosuppressive drugs, initial encounter: Secondary | ICD-10-CM

## 2018-08-22 DIAGNOSIS — M159 Polyosteoarthritis, unspecified: Secondary | ICD-10-CM

## 2018-08-22 DIAGNOSIS — M150 Primary generalized (osteo)arthritis: Secondary | ICD-10-CM | POA: Diagnosis not present

## 2018-08-22 DIAGNOSIS — G894 Chronic pain syndrome: Secondary | ICD-10-CM

## 2018-08-22 DIAGNOSIS — E039 Hypothyroidism, unspecified: Secondary | ICD-10-CM

## 2018-08-22 DIAGNOSIS — G620 Drug-induced polyneuropathy: Secondary | ICD-10-CM | POA: Diagnosis not present

## 2018-08-22 DIAGNOSIS — E7800 Pure hypercholesterolemia, unspecified: Secondary | ICD-10-CM

## 2018-08-22 MED ORDER — HYDROCODONE-ACETAMINOPHEN 5-325 MG PO TABS: ORAL_TABLET | ORAL | 0 refills | Status: DC

## 2018-08-22 NOTE — Progress Notes (Signed)
Virtual Visit via Video Note  I connected with pt on 08/22/18 at  1:40 PM EDT by a video enabled telemedicine application and verified that I am speaking with the correct person using two identifiers.  Location patient: home Location provider:work or home office Persons participating in the virtual visit: patient, provider  I discussed the limitations of evaluation and management by telemedicine and the availability of in person appointments. The patient expressed understanding and agreed to proceed.  Telemedicine visit is a necessity given the COVID-19 restrictions in place at the current time.  HPI: [name] being seen for 3 mo f/u DM 2, HTN, HLD, and hypothyroidism. She is very happy-->she is in remission from her cancer!  DM: 60 U NPH tid along with an avg dose of 17 U mealtime insulin qAC.  Also metformin 500 qam and 1000 qPM.   Says glucoses consistently around 150.  Has had occ low glucose in middle of night and she has started a bedtime snack and this has helped. No side effects.  HTN: amlodipine 10 mg qd--> home bp monitoring <130/60s.    HLD: pravastatin 20mg  qd--> tolerating well. Working on lower fat/lower chol diet lately.  Hypothyroidism: 125 mcg T4 qAM, taking med qAM on empty stomach w/out any other meds at the same time.  PAIN: osteoarthritis low back, feet, L hip, knees, +chemotherapy -induced neuropathy (burning and numbness in feet.--> vicodin 5/325, most recent fill as per PMP AWARE was 06/28/18.  No suspicious activity.  CSC 11/03/17.   Usually takes vicodin in nighttime so she can sleep.  Not much use in daytime at all. Taking meloxicam daily.  ROS: no CP, no SOB, no wheezing, no cough, no dizziness, no HAs, no rashes, no melena/hematochezia.  No polyuria or polydipsia.  No myalgias or arthralgias.   Past Medical History:  Diagnosis Date  . Allergic rhinitis    Allergy testing: results pending as of 05/28/16 (Dr. Harold Hedge)  . Anemia   . Arthritis   .  Chemotherapy-induced neuropathy ([place]) 2019  . Complex endometrial hyperplasia with atypia 12/12   Most recent endometrial bx was 10/2012-NEG  . Cough variant asthma   . DDD (degenerative disc disease), lumbar   . Diabetes mellitus with complication ([place])    Mild nonprolif DR R eye 12/2017-->referred to retinal specialist  . Encounter for insertion of mirena IUD 5/12  . Endometrial cancer ([place]) 02/2018   Stage III (T3, Nx, Mx)  Path: endometroid adenocarcinoma involving cervix, uterus, and fallopian tubes (Pt got TAH & BSO 02/2018).  . Family history of adverse reaction to anesthesia    brother had severe hypotension  . Fracture, foot 8/14   Hx of left foot stress fracture  . GERD (gastroesophageal reflux disease)   . Hemorrhoids   . Herpes zoster 10/2015   L side belt-line  . History of adenomatous polyp of colon 02/25/13   5 mm cecal polyp removed by Dr. Trevor Mace 5 yrs  . History of blood transfusion   . History of cellulitis 08/2010   Left (Since replacemnt of Left knee  . History of recent dental procedure 02/2018  . Hyperkalemia 03/2018   started after pt started getting chemo-->had to stop enalapril.  Marland Kitchen Hyperlipidemia    Not on statin b/c lipids "stayed down when sugars came down" per pt.  She says Dr. Chalmers Cater knows she is not on statin anymore.  . Hypertension   . Hypothyroidism   . Nephrolithiasis 4/07  . Obesity   .  OSA on CPAP   . Osteoarthritis    knees, ankle, + ? scapholunate ligament disruption (x-ray 06/2017)--ortho referral.  . Pancytopenia due to antineoplastic chemotherapy ([place])    progressive as of 06/2018.  Marland Kitchen Sciatica of left side   . Severe persistent asthma    cough-variant--saw Allergist 05/25/16 and was switched from max dose advair to symbicort.  . Systolic murmur    ECHO fine 10/2016-->murmur likely flow murmur assoc with HTN.  Marland Kitchen UTI (lower urinary tract infection)   . Zoon's vulvitis    Bx-proven (GYN) -lichen sclerosis.  Clobetasol 0.05% ointment per  GYN    Past Surgical History:  Procedure Laterality Date  . ANKLE FRACTURE SURGERY  1984   Pin & repair  . ARTHROSCOPIC REPAIR ACL  2000   Due to ACL tear  . ARTHROSCOPIC REPAIR ACL  5/08  . Blateral knee rerlacements x4 2 times each knee    . BREAST BIOPSY Right 2014   Benign  . CARDIAC CATHETERIZATION  5/07   clear vessel mild mitral   . CARPAL TUNNEL RELEASE Right [id number]   1989 Left  . CESAREAN SECTION  1985  . CHOLECYSTECTOMY OPEN  1988  . COLONOSCOPY N/A 02/25/2013   Tubular adenoma x 1: Recall 5 yrs. Procedure: COLONOSCOPY;  Surgeon: Juanita Craver, MD;  Location: [facility];  Service: Endoscopy;  Laterality: N/A;  . COMBINED HYSTEROSCOPY DIAGNOSTIC / D&C  2/12   Bx neg  . DEXA  08/2007   Bone density normal.  . DILATION AND CURETTAGE OF UTERUS  12/11  . IR IMAGING GUIDED PORT INSERTION  03/16/2018  . LYMPH NODE BIOPSY N/A 02/19/2018   Procedure: Sentinel LYMPH NODE BIOPSY;  Surgeon: Everitt Amber, MD;  Location: [facility];  Service: Gynecology;  Laterality: N/A;  . REPLACEMENT TOTAL KNEE Left 5/12   X 2 on each  . ROBOTIC ASSISTED TOTAL HYSTERECTOMY WITH BILATERAL SALPINGO OOPHERECTOMY N/A 02/19/2018   For endometrial cancer.  Procedure: XI ROBOTIC ASSISTED TOTAL HYSTERECTOMY WITH BILATERAL SALPINGO OOPHORECTOMY;  Surgeon: Everitt Amber, MD;  Location: [place];  Service: Gynecology;  Laterality: N/A;  . TRANSTHORACIC ECHOCARDIOGRAM  10/11/2016    EF 55-60%, grd I DD.  . TUBAL LIGATION  1985   C-Section    Family History  Problem Relation Age of Onset  . Diabetes Father   . COPD Father   . Stroke Father   . Celiac disease Brother   . Rheum arthritis Brother   . Thyroid disease Brother   . Diabetes Brother   . Alzheimer's disease Mother   . Arthritis Mother   . Other Son        Died age 36 -Tetrology of Fallot - VSD/pulmonary atresia  . Breast cancer Other        postmenopausal when diagnosed     Current Outpatient  Medications:  .  albuterol (PROVENTIL) (2.5 MG/3ML) 0.083% nebulizer solution, Take 3 mLs (2.5 mg total) by nebulization every 4 (four) hours as needed for wheezing or shortness of breath (Dx: J45.50)., Disp: 75 mL, Rfl: 1 .  felodipine (PLENDIL) 10 MG 24 hr tablet, TAKE 1 TABLET BY MOUTH EVERY DAY, Disp: 90 tablet, Rfl: 1 .  fluticasone (CUTIVATE) 0.05 % cream, APPLY TO AFFECTED AREA OF R LOWER LEG TWICE PER DAY., Disp: 30 g, Rfl: 0 .  fluticasone (FLONASE SENSIMIST) 27.5 MCG/SPRAY nasal spray, Place 1 spray into the nose daily as needed for rhinitis., Disp: , Rfl:  .  furosemide (LASIX) 20  MG tablet, Take 1 tablet (20 mg total) by mouth daily., Disp: 90 tablet, Rfl: 1 .  HYDROcodone-acetaminophen (NORCO/VICODIN) 5-325 MG tablet, 1-2 tabs po qd prn pain, Disp: 90 tablet, Rfl: 0 .  insulin aspart (NOVOLOG) 100 UNIT/ML injection, USE PER SLIDING SCALE AT EACH MEAL (AVG USE IS 17 UNITS THREE TIMES DAILY), Disp: 50 mL, Rfl: 2 .  insulin NPH Human (NOVOLIN N) 100 UNIT/ML injection, 65 U SQ qAM, 65 U SQ mid day, and 61 U SQ q evening (Patient taking differently: Inject 60 Units into the skin 3 (three) times daily. ), Disp: 50 mL, Rfl: 5 .  Insulin Syringes, Disposable, U-100 0.3 ML MISC, Use to inject insulin--6 injections per day, Disp: 540 each, Rfl: 3 .  levocetirizine (XYZAL) 5 MG tablet, Take 5 mg by mouth at bedtime as needed for allergies. , Disp: , Rfl: 5 .  levothyroxine (SYNTHROID, LEVOTHROID) 125 MCG tablet, TAKE 1 TABLET BY MOUTH EVERY MORNING ON AN EMPTY STOMACH, Disp: 90 tablet, Rfl: 0 .  meloxicam (MOBIC) 15 MG tablet, TAKE 1 TABLET BY MOUTH EVERY DAY AS NEEDED, Disp: 30 tablet, Rfl: 3 .  metFORMIN (GLUCOPHAGE) 500 MG tablet, TAKE 1 TABLET BY MOUTH EVERY MORNING AND 2 TABLETS BY MOUTH AT BEDTIME, Disp: 270 tablet, Rfl: 1 .  miconazole (MICRO GUARD) 2 % powder, Apply 1 application topically 2 (two) times daily as needed for itching., Disp: , Rfl:  .  montelukast (SINGULAIR) 10 MG tablet, Take  10 mg by mouth every evening. , Disp: , Rfl:  .  pravastatin (PRAVACHOL) 20 MG tablet, TAKE 1 TABLET BY MOUTH EVERY DAY, Disp: 90 tablet, Rfl: 1 .  Probiotic Product (PROBIOTIC PO), Take 1 capsule by mouth daily., Disp: , Rfl:  .  senna-docusate (SENOKOT-S) 8.6-50 MG tablet, Take 2 tablets by mouth at bedtime. Hold if having loose stools, Disp: 30 tablet, Rfl: 1 .  SYMBICORT 160-4.5 MCG/ACT inhaler, Inhale 2 puffs into the lungs 2 (two) times daily., Disp: , Rfl:  .  VENTOLIN HFA 108 (90 Base) MCG/ACT inhaler, INHALE 2 PUFFS INTO LUNGS EVERY 6 HOURS AS NEEDED FOR WHEEZING/SHORTNESS OF BREATH, Disp: 18 Inhaler, Rfl: 0 .  clobetasol ointment (TEMOVATE) 0.05 %, APPLY TO AFFECTED AREA TWICE A DAY (Patient not taking: No sig reported), Disp: 60 g, Rfl: 1  EXAM:  VITALS per patient if applicable: BP (!) 947/65 (BP Location: Left Arm, Patient Position: Sitting, Cuff Size: Normal)   Pulse 80   Temp 98.4 F (36.9 C) (Oral)   Resp 16   Wt 164 lb (74.4 kg)   LMP 08/10/2010 Comment: Hysterectomy 02/02/18  BMI 30.99 kg/m    GENERAL: alert, oriented, appears well and in no acute distress  HEENT: atraumatic, conjunttiva clear, no obvious abnormalities on inspection of external nose and ears  NECK: normal movements of the head and neck  LUNGS: on inspection no signs of respiratory distress, breathing rate appears normal, no obvious gross SOB, gasping or wheezing  CV: no obvious cyanosis  MS: moves all visible extremities without noticeable abnormality  PSYCH/NEURO: pleasant and cooperative, no obvious depression or anxiety, speech and thought processing grossly intact  LABS: none today  Lab Results  Component Value Date   TSH 1.49 02/13/2017   Lab Results  Component Value Date   WBC 6.2 08/16/2018   HGB 8.3 (L) 08/16/2018   HCT 26.7 (L) 08/16/2018   MCV 111.7 (H) 08/16/2018   PLT 103 (L) 08/16/2018  No results found for: IRON, TIBC, FERRITIN  No results found for: VITAMINB12  Lab  Results  Component Value Date   CREATININE 0.90 08/16/2018   BUN 23 08/16/2018   NA 139 08/16/2018   K 4.0 08/16/2018   CL 99 08/16/2018   CO2 27 08/16/2018   Lab Results  Component Value Date   ALT 26 08/16/2018   AST 36 08/16/2018   ALKPHOS 76 08/16/2018   BILITOT 0.3 08/16/2018   Lab Results  Component Value Date   CHOL 144 02/13/2017   Lab Results  Component Value Date   HDL 31.80 (L) 02/13/2017   Lab Results  Component Value Date   LDLCALC 75 02/13/2017   Lab Results  Component Value Date   TRIG 184.0 (H) 02/13/2017   Lab Results  Component Value Date   CHOLHDL 5 02/13/2017   Lab Results  Component Value Date   HGBA1C 7.0 (A) 05/18/2018    ASSESSMENT AND PLAN:  Discussed the following assessment and plan:  1) DM 2: insulin-requiring.   Control good as per report of home glucose monitoring. Check HbA1c--future.  Urine microalb/cr --future. Continue checking glucose 4 times per day. Marland Kitchen  2) HTN: The current medical regimen is effective;  continue present plan and medications. Recent lytes/sCr normal.  3) HLD: tolerating statin.  FLP->future. Recent hepatic panel normal.  4) Hypothyroidism: taking med correctly. TSH--future.  5) Pancytopenia: secondary to chemo treatment for her stage III endometrial cancer.  This should gradually return to normal over the next few months since chemo treatments are finished.  6) Endometrial cancer, stage III: IN REMISSION!  She is very happy.  She is released from oncologist's care. She will continue q 69mo f/u with either her GYN or GYN surgeon for the next 2 yrs.   7)  Chronic pain syndrome: The current medical regimen is effective;  continue present plan and medications. Using vicodin 1-2 tabs qd usually, occasionally bid.   I did electronic rx's for vicodin 5/325, 1-2 bid prn, #90 today for this month, June 2020, and July 2020.  Appropriate fill on/after date was noted on each rx.  I discussed the assessment and  treatment plan with the patient. The patient was provided an opportunity to ask questions and all were answered. The patient agreed with the plan and demonstrated an understanding of the instructions.   The patient was advised to call back or seek an in-person evaluation if the symptoms worsen or if the condition fails to improve as anticipated.  F/u: 3 mo f/u [facility]--needs new CSC and get UDS at that time.  Signed:  Crissie Sickles, MD           08/22/2018

## 2018-08-27 ENCOUNTER — Ambulatory Visit: Payer: Medicare Other

## 2018-08-31 ENCOUNTER — Telehealth: Payer: Self-pay | Admitting: Oncology

## 2018-08-31 NOTE — Telephone Encounter (Signed)
Left a message with appointment to see Dr. Denman George, on 11/16/18 at 1:15 pm.

## 2018-09-02 ENCOUNTER — Other Ambulatory Visit: Payer: Self-pay | Admitting: Family Medicine

## 2018-09-10 ENCOUNTER — Encounter: Payer: Self-pay | Admitting: Family Medicine

## 2018-09-10 ENCOUNTER — Other Ambulatory Visit: Payer: Self-pay

## 2018-09-10 ENCOUNTER — Ambulatory Visit (INDEPENDENT_AMBULATORY_CARE_PROVIDER_SITE_OTHER): Payer: Medicare Other | Admitting: Family Medicine

## 2018-09-10 VITALS — BP 165/72 | HR 86 | Temp 97.3°F

## 2018-09-10 DIAGNOSIS — N3000 Acute cystitis without hematuria: Secondary | ICD-10-CM | POA: Diagnosis not present

## 2018-09-10 MED ORDER — SULFAMETHOXAZOLE-TRIMETHOPRIM 800-160 MG PO TABS: 1.0000 | ORAL_TABLET | Freq: Two times a day (BID) | ORAL | 0 refills | Status: DC

## 2018-09-10 NOTE — Progress Notes (Signed)
Virtual Visit via Video Note  I connected with [name] on 09/10/18 at 10:20 AM EDT by a video enabled telemedicine application and verified that I am speaking with the correct person using two identifiers.  Location patient: home Location provider:work or home office Persons participating in the virtual visit: patient, provider  I discussed the limitations of evaluation and management by telemedicine and the availability of in person appointments. The patient expressed understanding and agreed to proceed.  Telemedicine visit is a necessity given the COVID-19 restrictions in place at the current time.  HPI: [name] being seen today for 2-3 d hx of urinary urgency, frequency, burning with urination and suprapubic pressure and intermittent cramping.  No fevers, no n/v, no blood in urine. Took AZO cranberry but minimal help.  ROS: See pertinent positives and negatives per HPI.  Past Medical History:  Diagnosis Date  . Allergic rhinitis    Allergy testing: results pending as of 05/28/16 (Dr. Harold Hedge)  . Anemia   . Arthritis   . Chemotherapy-induced neuropathy ([place]) 2019  . Cough variant asthma   . DDD (degenerative disc disease), lumbar   . Diabetes mellitus with complication ([place])    Mild nonprolif DR R eye 12/2017-->referred to retinal specialist  . Endometrial cancer ([place]) 02/2018   REMISSION AS OF 08/2018.  Stage III (T3, Nx, Mx)  Path: endometroid adenocarcinoma involving cervix, uterus, and fallopian tubes ([name] got TAH & BSO 02/2018).  . GERD (gastroesophageal reflux disease)   . Hemorrhoids   . Herpes zoster 10/2015   L side belt-line  . History of adenomatous polyp of colon 02/25/13   5 mm cecal polyp removed by Dr. Trevor Mace 5 yrs  . History of blood transfusion   . History of cellulitis 08/2010   Left (Since replacemnt of Left knee  . Hyperkalemia 03/2018   started after [name] started getting chemo-->had to stop enalapril.  Marland Kitchen Hyperlipidemia    Not on statin b/c lipids  "stayed down when sugars came down" per [name].  She says Dr. Chalmers Cater knows she is not on statin anymore.  . Hypertension   . Hypothyroidism   . Nephrolithiasis 4/07  . Obesity   . OSA on CPAP   . Osteoarthritis    knees, ankle, + ? scapholunate ligament disruption (x-ray 06/2017)--ortho referral.  . Pancytopenia due to antineoplastic chemotherapy ([place])    progressive as of 06/2018. Stable 08/2018.  Marland Kitchen Sciatica of left side   . Severe persistent asthma    cough-variant--saw Allergist 05/25/16 and was switched from max dose advair to symbicort.  . Systolic murmur    ECHO fine 10/2016-->murmur likely flow murmur assoc with HTN.  Marland Kitchen Zoon's vulvitis    Bx-proven (GYN) -lichen sclerosis.  Clobetasol 0.05% ointment per GYN    Past Surgical History:  Procedure Laterality Date  . ANKLE FRACTURE SURGERY  1984   Pin & repair  . ARTHROSCOPIC REPAIR ACL  2000   Due to ACL tear  . ARTHROSCOPIC REPAIR ACL  5/08  . Blateral knee rerlacements x4 2 times each knee    . BREAST BIOPSY Right 2014   Benign  . CARDIAC CATHETERIZATION  5/07   clear vessel mild mitral   . CARPAL TUNNEL RELEASE Right [id number]   1989 Left  . CESAREAN SECTION  1985  . CHOLECYSTECTOMY OPEN  1988  . COLONOSCOPY N/A 02/25/2013   Tubular adenoma x 1: Recall 5 yrs. Procedure: COLONOSCOPY;  Surgeon: Juanita Craver, MD;  Location: [facility];  Service: Endoscopy;  Laterality: N/A;  . COMBINED HYSTEROSCOPY DIAGNOSTIC / D&C  2/12   Bx neg  . DEXA  08/2007   Bone density normal.  . DILATION AND CURETTAGE OF UTERUS  12/11  . IR IMAGING GUIDED PORT INSERTION  03/16/2018  . LYMPH NODE BIOPSY N/A 02/19/2018   Procedure: Sentinel LYMPH NODE BIOPSY;  Surgeon: Everitt Amber, MD;  Location: [facility];  Service: Gynecology;  Laterality: N/A;  . REPLACEMENT TOTAL KNEE Left 5/12   X 2 on each  . ROBOTIC ASSISTED TOTAL HYSTERECTOMY WITH BILATERAL SALPINGO OOPHERECTOMY N/A 02/19/2018   For endometrial cancer.  Procedure: XI ROBOTIC  ASSISTED TOTAL HYSTERECTOMY WITH BILATERAL SALPINGO OOPHORECTOMY;  Surgeon: Everitt Amber, MD;  Location: [place];  Service: Gynecology;  Laterality: N/A;  . TRANSTHORACIC ECHOCARDIOGRAM  10/11/2016    EF 55-60%, grd I DD.  . TUBAL LIGATION  1985   C-Section    Family History  Problem Relation Age of Onset  . Diabetes Father   . COPD Father   . Stroke Father   . Celiac disease Brother   . Rheum arthritis Brother   . Thyroid disease Brother   . Diabetes Brother   . Alzheimer's disease Mother   . Arthritis Mother   . Other Son        Died age 30 -Tetrology of Fallot - VSD/pulmonary atresia  . Breast cancer Other        postmenopausal when diagnosed     Current Outpatient Medications:  .  albuterol (PROVENTIL) (2.5 MG/3ML) 0.083% nebulizer solution, Take 3 mLs (2.5 mg total) by nebulization every 4 (four) hours as needed for wheezing or shortness of breath (Dx: J45.50)., Disp: 75 mL, Rfl: 1 .  albuterol (VENTOLIN HFA) 108 (90 Base) MCG/ACT inhaler, INHALE 2 PUFFS INTO LUNGS EVERY 6 HOURS AS NEEDED FOR WHEEZING/SHORTNESS OF BREATH, Disp: 18 Inhaler, Rfl: 0 .  felodipine (PLENDIL) 10 MG 24 hr tablet, TAKE 1 TABLET BY MOUTH EVERY DAY, Disp: 90 tablet, Rfl: 1 .  fluticasone (CUTIVATE) 0.05 % cream, APPLY TO AFFECTED AREA OF R LOWER LEG TWICE PER DAY., Disp: 30 g, Rfl: 0 .  fluticasone (FLONASE SENSIMIST) 27.5 MCG/SPRAY nasal spray, Place 1 spray into the nose daily as needed for rhinitis., Disp: , Rfl:  .  furosemide (LASIX) 20 MG tablet, Take 1 tablet (20 mg total) by mouth daily., Disp: 90 tablet, Rfl: 1 .  HYDROcodone-acetaminophen (NORCO/VICODIN) 5-325 MG tablet, 1-2 tabs po bid prn pain, Disp: 90 tablet, Rfl: 0 .  insulin aspart (NOVOLOG) 100 UNIT/ML injection, USE PER SLIDING SCALE AT EACH MEAL (AVG USE IS 17 UNITS THREE TIMES DAILY), Disp: 50 mL, Rfl: 2 .  insulin NPH Human (NOVOLIN N) 100 UNIT/ML injection, 65 U SQ qAM, 65 U SQ mid day, and 61 U SQ q evening  (Patient taking differently: Inject 60 Units into the skin 3 (three) times daily. ), Disp: 50 mL, Rfl: 5 .  Insulin Syringes, Disposable, U-100 0.3 ML MISC, Use to inject insulin--6 injections per day, Disp: 540 each, Rfl: 3 .  levocetirizine (XYZAL) 5 MG tablet, Take 5 mg by mouth at bedtime as needed for allergies. , Disp: , Rfl: 5 .  levothyroxine (SYNTHROID, LEVOTHROID) 125 MCG tablet, TAKE 1 TABLET BY MOUTH EVERY MORNING ON AN EMPTY STOMACH, Disp: 90 tablet, Rfl: 0 .  meloxicam (MOBIC) 15 MG tablet, TAKE 1 TABLET BY MOUTH EVERY DAY AS NEEDED, Disp: 30 tablet, Rfl: 3 .  metFORMIN (GLUCOPHAGE) 500 MG tablet,  TAKE 1 TABLET BY MOUTH EVERY MORNING AND 2 TABLETS BY MOUTH AT BEDTIME, Disp: 270 tablet, Rfl: 1 .  miconazole (MICRO GUARD) 2 % powder, Apply 1 application topically 2 (two) times daily as needed for itching., Disp: , Rfl:  .  montelukast (SINGULAIR) 10 MG tablet, Take 10 mg by mouth every evening. , Disp: , Rfl:  .  pravastatin (PRAVACHOL) 20 MG tablet, TAKE 1 TABLET BY MOUTH EVERY DAY, Disp: 90 tablet, Rfl: 1 .  Probiotic Product (PROBIOTIC PO), Take 1 capsule by mouth daily., Disp: , Rfl:  .  senna-docusate (SENOKOT-S) 8.6-50 MG tablet, Take 2 tablets by mouth at bedtime. Hold if having loose stools, Disp: 30 tablet, Rfl: 1 .  SYMBICORT 160-4.5 MCG/ACT inhaler, Inhale 2 puffs into the lungs 2 (two) times daily., Disp: , Rfl:  .  clobetasol ointment (TEMOVATE) 0.05 %, APPLY TO AFFECTED AREA TWICE A DAY (Patient not taking: Reported on 09/10/2018), Disp: 60 g, Rfl: 1 .  sulfamethoxazole-trimethoprim (BACTRIM DS) 800-160 MG tablet, Take 1 tablet by mouth 2 (two) times daily., Disp: 10 tablet, Rfl: 0  EXAM:  VITALS per patient if applicable: BP (!) 417/40 (BP Location: Left Arm, Patient Position: Sitting, Cuff Size: Large)   Pulse 86   Temp (!) 97.3 F (36.3 C) (Oral)   LMP 08/10/2010 Comment: Hysterectomy 02/02/18   GENERAL: alert, oriented, appears well and in no acute  distress  HEENT: atraumatic, conjunttiva clear, no obvious abnormalities on inspection of external nose and ears  NECK: normal movements of the head and neck  LUNGS: on inspection no signs of respiratory distress, breathing rate appears normal, no obvious gross SOB, gasping or wheezing  CV: no obvious cyanosis  MS: moves all visible extremities without noticeable abnormality  PSYCH/NEURO: pleasant and cooperative, no obvious depression or anxiety, speech and thought processing grossly intact  LABS: none today    Chemistry      Component Value Date/Time   NA 139 08/16/2018 1141   K 4.0 08/16/2018 1141   CL 99 08/16/2018 1141   CO2 27 08/16/2018 1141   BUN 23 08/16/2018 1141   CREATININE 0.90 08/16/2018 1141   CREATININE 0.92 07/17/2018 1028      Component Value Date/Time   CALCIUM 8.7 (L) 08/16/2018 1141   ALKPHOS 76 08/16/2018 1141   AST 36 08/16/2018 1141   AST 38 07/17/2018 1028   ALT 26 08/16/2018 1141   ALT 28 07/17/2018 1028   BILITOT 0.3 08/16/2018 1141   BILITOT 0.4 07/17/2018 1028      ASSESSMENT AND PLAN:  Discussed the following assessment and plan:  UTI: will rx bactrim DS 1 bid x 3-5 d. If not improving any in 48h then we'll have her come in to give urine sample for dip + c/s. Signs/symptoms to call or return for were reviewed and [name] expressed understanding.   I discussed the assessment and treatment plan with the patient. The patient was provided an opportunity to ask questions and all were answered. The patient agreed with the plan and demonstrated an understanding of the instructions.   The patient was advised to call back or seek an in-person evaluation if the symptoms worsen or if the condition fails to improve as anticipated.  F/u: prn  Signed:  Crissie Sickles, MD           09/10/2018

## 2018-09-19 ENCOUNTER — Other Ambulatory Visit: Payer: Self-pay | Admitting: Radiology

## 2018-09-20 ENCOUNTER — Other Ambulatory Visit: Payer: Self-pay

## 2018-09-20 ENCOUNTER — Encounter (HOSPITAL_COMMUNITY): Payer: Self-pay

## 2018-09-20 ENCOUNTER — Ambulatory Visit (HOSPITAL_COMMUNITY)
Admission: RE | Admit: 2018-09-20 | Discharge: 2018-09-20 | Disposition: A | Discharge status: Home / Self Care | Payer: Medicare Other | Source: Ambulatory Visit | Attending: Hematology and Oncology | Admitting: Hematology and Oncology

## 2018-09-20 DIAGNOSIS — Z91040 Latex allergy status: Secondary | ICD-10-CM | POA: Diagnosis not present

## 2018-09-20 DIAGNOSIS — E119 Type 2 diabetes mellitus without complications: Secondary | ICD-10-CM | POA: Diagnosis not present

## 2018-09-20 DIAGNOSIS — Z8542 Personal history of malignant neoplasm of other parts of uterus: Secondary | ICD-10-CM | POA: Diagnosis not present

## 2018-09-20 DIAGNOSIS — Z452 Encounter for adjustment and management of vascular access device: Secondary | ICD-10-CM | POA: Diagnosis not present

## 2018-09-20 DIAGNOSIS — C541 Malignant neoplasm of endometrium: Secondary | ICD-10-CM | POA: Diagnosis not present

## 2018-09-20 DIAGNOSIS — E785 Hyperlipidemia, unspecified: Secondary | ICD-10-CM | POA: Diagnosis not present

## 2018-09-20 DIAGNOSIS — Z7951 Long term (current) use of inhaled steroids: Secondary | ICD-10-CM | POA: Diagnosis not present

## 2018-09-20 DIAGNOSIS — J4550 Severe persistent asthma, uncomplicated: Secondary | ICD-10-CM | POA: Diagnosis not present

## 2018-09-20 DIAGNOSIS — Z79899 Other long term (current) drug therapy: Secondary | ICD-10-CM | POA: Insufficient documentation

## 2018-09-20 DIAGNOSIS — G4733 Obstructive sleep apnea (adult) (pediatric): Secondary | ICD-10-CM | POA: Diagnosis not present

## 2018-09-20 DIAGNOSIS — Z90722 Acquired absence of ovaries, bilateral: Secondary | ICD-10-CM | POA: Insufficient documentation

## 2018-09-20 DIAGNOSIS — Z794 Long term (current) use of insulin: Secondary | ICD-10-CM | POA: Insufficient documentation

## 2018-09-20 DIAGNOSIS — Z6841 Body Mass Index (BMI) 40.0 and over, adult: Secondary | ICD-10-CM | POA: Insufficient documentation

## 2018-09-20 DIAGNOSIS — I10 Essential (primary) hypertension: Secondary | ICD-10-CM | POA: Insufficient documentation

## 2018-09-20 DIAGNOSIS — M1990 Unspecified osteoarthritis, unspecified site: Secondary | ICD-10-CM | POA: Insufficient documentation

## 2018-09-20 DIAGNOSIS — Z880 Allergy status to penicillin: Secondary | ICD-10-CM | POA: Insufficient documentation

## 2018-09-20 DIAGNOSIS — Z90710 Acquired absence of both cervix and uterus: Secondary | ICD-10-CM | POA: Insufficient documentation

## 2018-09-20 DIAGNOSIS — K219 Gastro-esophageal reflux disease without esophagitis: Secondary | ICD-10-CM | POA: Insufficient documentation

## 2018-09-20 DIAGNOSIS — E669 Obesity, unspecified: Secondary | ICD-10-CM | POA: Insufficient documentation

## 2018-09-20 DIAGNOSIS — E039 Hypothyroidism, unspecified: Secondary | ICD-10-CM | POA: Diagnosis not present

## 2018-09-20 DIAGNOSIS — Z79890 Hormone replacement therapy (postmenopausal): Secondary | ICD-10-CM | POA: Insufficient documentation

## 2018-09-20 DIAGNOSIS — Z5111 Encounter for antineoplastic chemotherapy: Secondary | ICD-10-CM | POA: Diagnosis not present

## 2018-09-20 HISTORY — PX: IR REMOVAL TUN ACCESS W/ PORT W/O FL MOD SED: IMG2290

## 2018-09-20 LAB — CBC WITH DIFFERENTIAL/PLATELET
Abs Immature Granulocytes: 0.09 10*3/uL — ABNORMAL HIGH (ref 0.00–0.07)
Basophils Absolute: 0 10*3/uL (ref 0.0–0.1)
Basophils Relative: 0 %
Eosinophils Absolute: 0.3 10*3/uL (ref 0.0–0.5)
Eosinophils Relative: 4 %
HCT: 28.7 % — ABNORMAL LOW (ref 36.0–46.0)
Hemoglobin: 8.9 g/dL — ABNORMAL LOW (ref 12.0–15.0)
Immature Granulocytes: 1 %
Lymphocytes Relative: 15 %
Lymphs Abs: 1.1 10*3/uL (ref 0.7–4.0)
MCH: 34.6 pg — ABNORMAL HIGH (ref 26.0–34.0)
MCHC: 31 g/dL (ref 30.0–36.0)
MCV: 111.7 fL — ABNORMAL HIGH (ref 80.0–100.0)
Monocytes Absolute: 0.9 10*3/uL (ref 0.1–1.0)
Monocytes Relative: 12 %
Neutro Abs: 5 10*3/uL (ref 1.7–7.7)
Neutrophils Relative %: 68 %
Platelets: 152 10*3/uL (ref 150–400)
RBC: 2.57 MIL/uL — ABNORMAL LOW (ref 3.87–5.11)
RDW: 14.9 % (ref 11.5–15.5)
WBC: 7.4 10*3/uL (ref 4.0–10.5)
nRBC: 0 % (ref 0.0–0.2)

## 2018-09-20 LAB — GLUCOSE, CAPILLARY: Glucose-Capillary: 128 mg/dL — ABNORMAL HIGH (ref 70–99)

## 2018-09-20 MED ORDER — LIDOCAINE HCL (PF) 1 % IJ SOLN
INTRAMUSCULAR | Status: AC | PRN
  Administered 2018-09-20: 10 mL

## 2018-09-20 MED ORDER — MIDAZOLAM HCL 2 MG/2ML IJ SOLN
INTRAMUSCULAR | Status: AC | PRN
  Administered 2018-09-20: 1 mg via INTRAVENOUS
  Administered 2018-09-20: 2 mg via INTRAVENOUS
  Administered 2018-09-20: 1 mg via INTRAVENOUS

## 2018-09-20 MED ORDER — CLINDAMYCIN PHOSPHATE 900 MG/50ML IV SOLN: 900.0000 mg | Freq: Once | INTRAVENOUS | Status: DC

## 2018-09-20 MED ORDER — FENTANYL CITRATE (PF) 100 MCG/2ML IJ SOLN
INTRAMUSCULAR | Status: AC | PRN
  Administered 2018-09-20 (×2): 50 ug via INTRAVENOUS

## 2018-09-20 MED ORDER — VANCOMYCIN HCL 10 G IV SOLR
1500.0000 mg | Freq: Once | INTRAVENOUS | Status: AC
  Administered 2018-09-20: 1500 mg via INTRAVENOUS
  Filled 2018-09-20: qty 1500

## 2018-09-20 MED ORDER — SODIUM CHLORIDE 0.9 % IV SOLN
INTRAVENOUS | Status: DC
  Administered 2018-09-20: 13:00:00 via INTRAVENOUS

## 2018-09-20 MED ORDER — LIDOCAINE HCL 1 % IJ SOLN
INTRAMUSCULAR | Status: AC
  Filled 2018-09-20: qty 20

## 2018-09-20 MED ORDER — MIDAZOLAM HCL 2 MG/2ML IJ SOLN
INTRAMUSCULAR | Status: AC
  Filled 2018-09-20: qty 4

## 2018-09-20 MED ORDER — FENTANYL CITRATE (PF) 100 MCG/2ML IJ SOLN
INTRAMUSCULAR | Status: AC
  Filled 2018-09-20: qty 2

## 2018-09-20 NOTE — H&P (Signed)
Referring Physician(s): Heath Lark  Supervising Physician: Aletta Edouard  Patient Status:  [name]  Chief Complaint: "I'm here to get my port out"   Subjective: Patient familiar to IR service from Port-A-Cath placement on 03/16/2018.  She has a history of endometrial carcinoma, initially diagnosed in October 2019, status post surgery and chemoradiation.  She has completed treatment and has no recurrent disease on recent imaging.  She presents today for Port-A-Cath removal.  She currently denies fever, headache, chest pain, cough, abdominal pain, nausea, vomiting or bleeding.  She does have chronic low back pain, some left hip sciatica as well as some dyspnea with exertion/asthma .  Past Medical History:  Diagnosis Date  . Allergic rhinitis    Allergy testing: results pending as of 05/28/16 (Dr. Harold Hedge)  . Anemia   . Arthritis   . Chemotherapy-induced neuropathy ([place]) 2019  . Cough variant asthma   . DDD (degenerative disc disease), lumbar   . Diabetes mellitus with complication ([place])    Mild nonprolif DR R eye 12/2017-->referred to retinal specialist  . Endometrial cancer ([place]) 02/2018   REMISSION AS OF 08/2018.  Stage III (T3, Nx, Mx)  Path: endometroid adenocarcinoma involving cervix, uterus, and fallopian tubes (Pt got TAH & BSO 02/2018).  . GERD (gastroesophageal reflux disease)   . Hemorrhoids   . Herpes zoster 10/2015   L side belt-line  . History of adenomatous polyp of colon 02/25/13   5 mm cecal polyp removed by Dr. Trevor Mace 5 yrs  . History of blood transfusion   . History of cellulitis 08/2010   Left (Since replacemnt of Left knee  . Hyperkalemia 03/2018   started after pt started getting chemo-->had to stop enalapril.  Marland Kitchen Hyperlipidemia    Not on statin b/c lipids "stayed down when sugars came down" per pt.  She says Dr. Chalmers Cater knows she is not on statin anymore.  . Hypertension   . Hypothyroidism   . Nephrolithiasis 4/07  . Obesity   . OSA on CPAP   .  Osteoarthritis    knees, ankle, + ? scapholunate ligament disruption (x-ray 06/2017)--ortho referral.  . Pancytopenia due to antineoplastic chemotherapy ([place])    progressive as of 06/2018. Stable 08/2018.  Marland Kitchen Sciatica of left side   . Severe persistent asthma    cough-variant--saw Allergist 05/25/16 and was switched from max dose advair to symbicort.  . Systolic murmur    ECHO fine 10/2016-->murmur likely flow murmur assoc with HTN.  Marland Kitchen Zoon's vulvitis    Bx-proven (GYN) -lichen sclerosis.  Clobetasol 0.05% ointment per GYN   Past Surgical History:  Procedure Laterality Date  . ANKLE FRACTURE SURGERY  1984   Pin & repair  . ARTHROSCOPIC REPAIR ACL  2000   Due to ACL tear  . ARTHROSCOPIC REPAIR ACL  5/08  . Blateral knee rerlacements x4 2 times each knee    . BREAST BIOPSY Right 2014   Benign  . CARDIAC CATHETERIZATION  5/07   clear vessel mild mitral   . CARPAL TUNNEL RELEASE Right [id number]   1989 Left  . CESAREAN SECTION  1985  . CHOLECYSTECTOMY OPEN  1988  . COLONOSCOPY N/A 02/25/2013   Tubular adenoma x 1: Recall 5 yrs. Procedure: COLONOSCOPY;  Surgeon: Juanita Craver, MD;  Location: [facility];  Service: Endoscopy;  Laterality: N/A;  . COMBINED HYSTEROSCOPY DIAGNOSTIC / D&C  2/12   Bx neg  . DEXA  08/2007   Bone density normal.  . DILATION AND CURETTAGE OF  UTERUS  12/11  . IR IMAGING GUIDED PORT INSERTION  03/16/2018  . LYMPH NODE BIOPSY N/A 02/19/2018   Procedure: Sentinel LYMPH NODE BIOPSY;  Surgeon: Everitt Amber, MD;  Location: [facility];  Service: Gynecology;  Laterality: N/A;  . REPLACEMENT TOTAL KNEE Left 5/12   X 2 on each  . ROBOTIC ASSISTED TOTAL HYSTERECTOMY WITH BILATERAL SALPINGO OOPHERECTOMY N/A 02/19/2018   For endometrial cancer.  Procedure: XI ROBOTIC ASSISTED TOTAL HYSTERECTOMY WITH BILATERAL SALPINGO OOPHORECTOMY;  Surgeon: Everitt Amber, MD;  Location: [facility];  Service: Gynecology;  Laterality: N/A;  . TRANSTHORACIC  ECHOCARDIOGRAM  10/11/2016    EF 55-60%, grd I DD.  . TUBAL LIGATION  1985   C-Section       Allergies: Adhesive [tape], Banana, Eggs or egg-derived products, Latex, Penicillins, Pine, and Rose  Medications: Prior to Admission medications   Medication Sig Start Date End Date Taking? Authorizing Provider  albuterol (PROVENTIL) (2.5 MG/3ML) 0.083% nebulizer solution Take 3 mLs (2.5 mg total) by nebulization every 4 (four) hours as needed for wheezing or shortness of breath (Dx: J45.50). 07/04/17   McGowen, Adrian Blackwater, MD  albuterol (VENTOLIN HFA) 108 (90 Base) MCG/ACT inhaler INHALE 2 PUFFS INTO LUNGS EVERY 6 HOURS AS NEEDED FOR WHEEZING/SHORTNESS OF BREATH 09/04/18   McGowen, Adrian Blackwater, MD  clobetasol ointment (TEMOVATE) 0.05 % APPLY TO AFFECTED AREA TWICE A DAY Patient not taking: Reported on 09/10/2018 02/01/17   Megan Salon, MD  felodipine (PLENDIL) 10 MG 24 hr tablet TAKE 1 TABLET BY MOUTH EVERY DAY 05/25/18   McGowen, Adrian Blackwater, MD  fluticasone (CUTIVATE) 0.05 % cream APPLY TO AFFECTED AREA OF R LOWER LEG TWICE PER DAY. 07/26/18   McGowen, Adrian Blackwater, MD  fluticasone (FLONASE SENSIMIST) 27.5 MCG/SPRAY nasal spray Place 1 spray into the nose daily as needed for rhinitis.    [provider]  furosemide (LASIX) 20 MG tablet Take 1 tablet (20 mg total) by mouth daily. 07/05/18   McGowen, Adrian Blackwater, MD  HYDROcodone-acetaminophen (NORCO/VICODIN) 5-325 MG tablet 1-2 tabs po bid prn pain 08/22/18   McGowen, Adrian Blackwater, MD  insulin aspart (NOVOLOG) 100 UNIT/ML injection USE PER SLIDING SCALE AT [facility] MEAL (AVG USE IS 17 UNITS THREE TIMES DAILY) 02/22/18   McGowen, Adrian Blackwater, MD  insulin NPH Human (NOVOLIN N) 100 UNIT/ML injection 65 U SQ qAM, 65 U SQ mid day, and 61 U SQ q evening Patient taking differently: Inject 60 Units into the skin 3 (three) times daily.  01/25/18   McGowen, Adrian Blackwater, MD  Insulin Syringes, Disposable, U-100 0.3 ML MISC Use to inject insulin--6 injections per day 02/05/18    McGowen, Adrian Blackwater, MD  levocetirizine (XYZAL) 5 MG tablet Take 5 mg by mouth at bedtime as needed for allergies.  01/30/17   [provider]  levothyroxine (SYNTHROID, LEVOTHROID) 125 MCG tablet TAKE 1 TABLET BY MOUTH EVERY MORNING ON AN EMPTY STOMACH 07/09/18   McGowen, Adrian Blackwater, MD  meloxicam (MOBIC) 15 MG tablet TAKE 1 TABLET BY MOUTH EVERY DAY AS NEEDED 05/29/18   McGowen, Adrian Blackwater, MD  metFORMIN (GLUCOPHAGE) 500 MG tablet TAKE 1 TABLET BY MOUTH EVERY MORNING AND 2 TABLETS BY MOUTH AT BEDTIME 04/10/18   McGowen, Adrian Blackwater, MD  miconazole (MICRO GUARD) 2 % powder Apply 1 application topically 2 (two) times daily as needed for itching.    [provider]  montelukast (SINGULAIR) 10 MG tablet Take 10 mg by mouth every evening.  07/19/16   [provider]  pravastatin (PRAVACHOL) 20 MG tablet TAKE 1 TABLET BY MOUTH EVERY DAY 04/25/18   McGowen, Adrian Blackwater, MD  Probiotic Product (PROBIOTIC PO) Take 1 capsule by mouth daily.    [provider]  senna-docusate (SENOKOT-S) 8.6-50 MG tablet Take 2 tablets by mouth at bedtime. Hold if having loose stools 02/20/18   Cross, Lenna Sciara D, NP  sulfamethoxazole-trimethoprim (BACTRIM DS) 800-160 MG tablet Take 1 tablet by mouth 2 (two) times daily. 09/10/18   McGowen, Adrian Blackwater, MD  SYMBICORT 160-4.5 MCG/ACT inhaler Inhale 2 puffs into the lungs 2 (two) times daily. 08/06/16   [provider]  prochlorperazine (COMPAZINE) 10 MG tablet Take 1 tablet (10 mg total) by mouth every 6 (six) hours as needed (Nausea or vomiting). 03/19/18 08/20/18  Heath Lark, MD     Vital Signs: BP (!) 169/58   Pulse 92   Temp 98.1 F (36.7 C) (Oral)   Resp 18   LMP 08/10/2010 Comment: Hysterectomy 02/02/18  SpO2 95%   Physical Exam awake, alert.  Chest clear to auscultation bilaterally.  Clean, intact right chest wall Port-A-Cath.  Heart with regular rate and rhythm.  Abdomen obese, soft, positive bowel sounds, nontender.  Bilateral lower  extremity edema noted  Imaging: No results found.  Labs:  CBC: Recent Labs    05/30/18 1122 06/19/18 0906 07/17/18 1028 08/16/18 1141  WBC 5.3 5.1 5.5 6.2  HGB 9.2* 8.4* 8.2* 8.3*  HCT 29.5* 27.1* 26.1* 26.7*  PLT 172 109* 125* 103*    COAGS: Recent Labs    03/16/18 1155  INR 0.94    BMP: Recent Labs    05/30/18 1122 06/19/18 0906 07/17/18 1028 08/16/18 1141  NA 141 140 140 139  K 4.8 4.6 4.2 4.0  CL 104 103 102 99  CO2 23 22 24 27   GLUCOSE 264* 292* 212* 149*  BUN 21 23 23 23   CALCIUM 9.1 9.0 8.5* 8.7*  CREATININE 0.99 0.92 0.92 0.90  GFRNONAA 59* >60 >60 >60  GFRAA >60 >60 >60 >60    LIVER FUNCTION TESTS: Recent Labs    05/30/18 1122 06/19/18 0906 07/17/18 1028 08/16/18 1141  BILITOT 0.4 0.4 0.4 0.3  AST 29 30 38 36  ALT 29 26 28 26   ALKPHOS 80 72 71 76  PROT 7.6 7.6 7.9 8.0  ALBUMIN 3.4* 3.3* 3.4* 3.4*    Assessment and Plan: Pt with history of endometrial carcinoma, initially diagnosed in October 2019, status post surgery and chemoradiation.  She has completed treatment and has no recurrent disease on recent imaging.  She presents today for Port-A-Cath removal.  Details/risks of procedure, including but not limited to, internal bleeding, infection, injury to adjacent structures discussed with patient with her understanding and consent.  LABS PENDING   Electronically Signed: D. Rowe Robert, PA-C 09/20/2018, 12:23 PM   I spent a total of 20 minutes at the the patient's bedside AND on the patient's hospital floor or unit, greater than 50% of which was counseling/coordinating care for Port-A-Cath removal

## 2018-09-20 NOTE — Procedures (Signed)
Interventional Radiology Procedure Note  Procedure: Right chest port removal  Complications: None  Estimated Blood Loss: < 10 mL  Findings: Right chest port removed in entirety without difficulty. Wound closed.  Venetia Night. Kathlene Cote, M.D Pager:  [phone]

## 2018-09-20 NOTE — Discharge Instructions (Addendum)
Implanted Port Removal, Care After This sheet gives you information about how to care for yourself after your procedure. Your health care provider may also give you more specific instructions. If you have problems or questions, contact your health care provider. What can I expect after the procedure? After the procedure, it is common to have:  Soreness or pain near your incision.  Some swelling or bruising near your incision. Follow these instructions at home: Medicines  Take over-the-counter and prescription medicines only as told by your health care provider.  If you were prescribed an antibiotic medicine, take it as told by your health care provider. Do not stop taking the antibiotic even if you start to feel better. Bathing  Do not take baths, swim, or use a hot tub until your health care provider approves. Ask your health care provider if you can take showers. You may only be allowed to take sponge baths.  You may shower tomorrow. Incision care   Follow instructions from your health care provider about how to take care of your incision. Make sure you: ? Wash your hands with soap and water before you change your bandage (dressing). If soap and water are not available, use hand sanitizer. ? Change your dressing as told by your health care provider.  You may remove your dressing tomorrow. ? Keep your dressing dry. ? Leave stitches (sutures), skin glue, or adhesive strips in place. These skin closures may need to stay in place for 2 weeks or longer. If adhesive strip edges start to loosen and curl up, you may trim the loose edges. Do not remove adhesive strips completely unless your health care provider tells you to do that.  Check your incision area every day for signs of infection. Check for: ? More redness, swelling, or pain. ? More fluid or blood. ? Warmth. ? Pus or a bad smell. Driving   Do not drive for 24 hours if you were given a medicine to help you relax (sedative) during  your procedure.  If you did not receive a sedative, ask your health care provider when it is safe to drive. Activity  Return to your normal activities as told by your health care provider. Ask your health care provider what activities are safe for you.  Do not lift anything that is heavier than 10 lb (4.5 kg), or the limit that you are told, until your health care provider says that it is safe.  Do not do activities that involve lifting your arms over your head. General instructions  Do not use any products that contain nicotine or tobacco, such as cigarettes and e-cigarettes. These can delay healing. If you need help quitting, ask your health care provider.  Keep all follow-up visits as told by your health care provider. This is important. Contact a health care provider if:  You have more redness, swelling, or pain around your incision.  You have more fluid or blood coming from your incision.  Your incision feels warm to the touch.  You have pus or a bad smell coming from your incision.  You have pain that is not relieved by your pain medicine. Get help right away if you have:  A fever or chills.  Chest pain.  Difficulty breathing. Summary  After the procedure, it is common to have pain, soreness, swelling, or bruising near your incision.  If you were prescribed an antibiotic medicine, take it as told by your health care provider. Do not stop taking the antibiotic even if  you start to feel better.  Do not drive for 24 hours if you were given a sedative during your procedure.  Return to your normal activities as told by your health care provider. Ask your health care provider what activities are safe for you. This information is not intended to replace advice given to you by your health care provider. Make sure you discuss any questions you have with your health care provider. Document Released: 03/09/2015 Document Revised: 05/11/2017 Document Reviewed: 05/11/2017 Elsevier  Interactive Patient Education  2019 [employer]. Moderate Conscious Sedation, Adult, Care After These instructions provide you with information about caring for yourself after your procedure. Your health care provider may also give you more specific instructions. Your treatment has been planned according to current medical practices, but problems sometimes occur. Call your health care provider if you have any problems or questions after your procedure. What can I expect after the procedure? After your procedure, it is common:  To feel sleepy for several hours.  To feel clumsy and have poor balance for several hours.  To have poor judgment for several hours.  To vomit if you eat too soon. Follow these instructions at home: For at least 24 hours after the procedure:   Do not: ? Participate in activities where you could fall or become injured. ? Drive. ? Use heavy machinery. ? Drink alcohol. ? Take sleeping pills or medicines that cause drowsiness. ? Make important decisions or sign legal documents. ? Take care of children on your own.  Rest. Eating and drinking  Follow the diet recommended by your health care provider.  If you vomit: ? Drink water, juice, or soup when you can drink without vomiting. ? Make sure you have little or no nausea before eating solid foods. General instructions  Have a responsible adult stay with you until you are awake and alert.  Take over-the-counter and prescription medicines only as told by your health care provider.  If you smoke, do not smoke without supervision.  Keep all follow-up visits as told by your health care provider. This is important. Contact a health care provider if:  You keep feeling nauseous or you keep vomiting.  You feel light-headed.  You develop a rash.  You have a fever. Get help right away if:  You have trouble breathing. This information is not intended to replace advice given to you by your health care  provider. Make sure you discuss any questions you have with your health care provider. Document Released: 01/16/2013 Document Revised: 08/31/2015 Document Reviewed: 07/18/2015 Elsevier Interactive Patient Education  2019 [employer].

## 2018-09-21 ENCOUNTER — Other Ambulatory Visit: Payer: Self-pay | Admitting: Hematology and Oncology

## 2018-09-28 ENCOUNTER — Ambulatory Visit (INDEPENDENT_AMBULATORY_CARE_PROVIDER_SITE_OTHER): Payer: Medicare Other | Admitting: Family Medicine

## 2018-09-28 ENCOUNTER — Other Ambulatory Visit: Payer: Self-pay

## 2018-09-28 ENCOUNTER — Ambulatory Visit: Payer: Medicare Other | Admitting: Family Medicine

## 2018-09-28 ENCOUNTER — Encounter: Payer: Self-pay | Admitting: Family Medicine

## 2018-09-28 VITALS — BP 132/72 | HR 105 | Temp 98.1°F | Resp 16 | Ht 61.0 in | Wt 264.0 lb

## 2018-09-28 DIAGNOSIS — R300 Dysuria: Secondary | ICD-10-CM

## 2018-09-28 DIAGNOSIS — N390 Urinary tract infection, site not specified: Secondary | ICD-10-CM

## 2018-09-28 DIAGNOSIS — Z8542 Personal history of malignant neoplasm of other parts of uterus: Secondary | ICD-10-CM

## 2018-09-28 DIAGNOSIS — R350 Frequency of micturition: Secondary | ICD-10-CM | POA: Diagnosis not present

## 2018-09-28 MED ORDER — CIPROFLOXACIN HCL 250 MG PO TABS: 250.0000 mg | ORAL_TABLET | Freq: Every day | ORAL | 6 refills | Status: DC

## 2018-09-28 MED ORDER — CIPROFLOXACIN HCL 500 MG PO TABS: 500.0000 mg | ORAL_TABLET | Freq: Two times a day (BID) | ORAL | 0 refills | Status: AC

## 2018-09-28 NOTE — Progress Notes (Signed)
OFFICE VISIT  09/28/2018   CC:  Chief Complaint  Patient presents with  . Urinary Tract Infection    frequency, dysuria, pressure   HPI:    Patient is a 68 y.o. Caucasian female who presents for concern of UTI. I saw her for virtual visit 09/10/18 for 2-3d of urinary urgency, frequency, and dysuria. I rx'd her Bactrim empirically and her sx's completely resolved. All sx's are back and even worse, starting about 24 h ago. Azo a little helpful last night.  No flank pain or abd pain. No fever.  Lack of appetite but no n/v.  No blood in urine.   No vaginal d/c.  She has been noticing that after every GYN exam (which she gets more often due to hx of endomet cancer), she tends to get a bladder infection. Also, due to having had pelvic irradiation for endometrial cancer she has to have vaginal dilation twice per week, making her additionally susceptible to UTI.   Past Medical History:  Diagnosis Date  . Allergic rhinitis    Allergy testing: results pending as of 05/28/16 (Dr. Harold Hedge)  . Anemia   . Arthritis   . Chemotherapy-induced neuropathy ([place]) 2019  . Cough variant asthma   . DDD (degenerative disc disease), lumbar   . Diabetes mellitus with complication ([place])    Mild nonprolif DR R eye 12/2017-->referred to retinal specialist  . Endometrial cancer ([place]) 02/2018   REMISSION AS OF 08/2018.  Stage III (T3, Nx, Mx)  Path: endometroid adenocarcinoma involving cervix, uterus, and fallopian tubes (Pt got TAH & BSO 02/2018).  . GERD (gastroesophageal reflux disease)   . Hemorrhoids   . Herpes zoster 10/2015   L side belt-line  . History of adenomatous polyp of colon 02/25/13   5 mm cecal polyp removed by Dr. Trevor Mace 5 yrs  . History of blood transfusion   . History of cellulitis 08/2010   Left (Since replacemnt of Left knee  . Hyperkalemia 03/2018   started after pt started getting chemo-->had to stop enalapril.  Marland Kitchen Hyperlipidemia    Not on statin b/c lipids "stayed down  when sugars came down" per pt.  She says Dr. Chalmers Cater knows she is not on statin anymore.  . Hypertension   . Hypothyroidism   . Nephrolithiasis 4/07  . Obesity   . OSA on CPAP   . Osteoarthritis    knees, ankle, + ? scapholunate ligament disruption (x-ray 06/2017)--ortho referral.  . Pancytopenia due to antineoplastic chemotherapy ([place])    progressive as of 06/2018. Stable 08/2018.  Marland Kitchen Sciatica of left side   . Severe persistent asthma    cough-variant--saw Allergist 05/25/16 and was switched from max dose advair to symbicort.  . Systolic murmur    ECHO fine 10/2016-->murmur likely flow murmur assoc with HTN.  Marland Kitchen Zoon's vulvitis    Bx-proven (GYN) -lichen sclerosis.  Clobetasol 0.05% ointment per GYN    Past Surgical History:  Procedure Laterality Date  . ANKLE FRACTURE SURGERY  1984   Pin & repair  . ARTHROSCOPIC REPAIR ACL  2000   Due to ACL tear  . ARTHROSCOPIC REPAIR ACL  5/08  . Blateral knee rerlacements x4 2 times each knee    . BREAST BIOPSY Right 2014   Benign  . CARDIAC CATHETERIZATION  5/07   clear vessel mild mitral   . CARPAL TUNNEL RELEASE Right [id number]   1989 Left  . CESAREAN SECTION  1985  . CHOLECYSTECTOMY OPEN  1988  . COLONOSCOPY N/A  02/25/2013   Tubular adenoma x 1: Recall 5 yrs. Procedure: COLONOSCOPY;  Surgeon: Juanita Craver, MD;  Location: [facility];  Service: Endoscopy;  Laterality: N/A;  . COMBINED HYSTEROSCOPY DIAGNOSTIC / D&C  2/12   Bx neg  . DEXA  08/2007   Bone density normal.  . DILATION AND CURETTAGE OF UTERUS  12/11  . IR IMAGING GUIDED PORT INSERTION  03/16/2018  . IR REMOVAL TUN ACCESS W/ PORT W/O FL MOD SED  09/20/2018  . LYMPH NODE BIOPSY N/A 02/19/2018   Procedure: Sentinel LYMPH NODE BIOPSY;  Surgeon: Everitt Amber, MD;  Location: [facility];  Service: Gynecology;  Laterality: N/A;  . REPLACEMENT TOTAL KNEE Left 5/12   X 2 on each  . ROBOTIC ASSISTED TOTAL HYSTERECTOMY WITH BILATERAL SALPINGO OOPHERECTOMY N/A 02/19/2018    For endometrial cancer.  Procedure: XI ROBOTIC ASSISTED TOTAL HYSTERECTOMY WITH BILATERAL SALPINGO OOPHORECTOMY;  Surgeon: Everitt Amber, MD;  Location: [place];  Service: Gynecology;  Laterality: N/A;  . TRANSTHORACIC ECHOCARDIOGRAM  10/11/2016    EF 55-60%, grd I DD.  . TUBAL LIGATION  1985   C-Section    Outpatient Medications Prior to Visit  Medication Sig Dispense Refill  . felodipine (PLENDIL) 10 MG 24 hr tablet TAKE 1 TABLET BY MOUTH EVERY DAY 90 tablet 1  . fluticasone (CUTIVATE) 0.05 % cream APPLY TO AFFECTED AREA OF R LOWER LEG TWICE PER DAY. 30 g 0  . fluticasone (FLONASE SENSIMIST) 27.5 MCG/SPRAY nasal spray Place 1 spray into the nose daily as needed for rhinitis.    . furosemide (LASIX) 20 MG tablet Take 1 tablet (20 mg total) by mouth daily. 90 tablet 1  . HYDROcodone-acetaminophen (NORCO/VICODIN) 5-325 MG tablet 1-2 tabs po bid prn pain 90 tablet 0  . insulin aspart (NOVOLOG) 100 UNIT/ML injection USE PER SLIDING SCALE AT EACH MEAL (AVG USE IS 17 UNITS THREE TIMES DAILY) 50 mL 2  . insulin NPH Human (NOVOLIN N) 100 UNIT/ML injection 65 U SQ qAM, 65 U SQ mid day, and 61 U SQ q evening (Patient taking differently: Inject 60 Units into the skin 3 (three) times daily. ) 50 mL 5  . Insulin Syringes, Disposable, U-100 0.3 ML MISC Use to inject insulin--6 injections per day 540 each 3  . levocetirizine (XYZAL) 5 MG tablet Take 5 mg by mouth at bedtime as needed for allergies.   5  . levothyroxine (SYNTHROID, LEVOTHROID) 125 MCG tablet TAKE 1 TABLET BY MOUTH EVERY MORNING ON AN EMPTY STOMACH 90 tablet 0  . meloxicam (MOBIC) 15 MG tablet TAKE 1 TABLET BY MOUTH EVERY DAY AS NEEDED 30 tablet 3  . metFORMIN (GLUCOPHAGE) 500 MG tablet TAKE 1 TABLET BY MOUTH EVERY MORNING AND 2 TABLETS BY MOUTH AT BEDTIME 270 tablet 1  . miconazole (MICRO GUARD) 2 % powder Apply 1 application topically 2 (two) times daily as needed for itching.    . montelukast (SINGULAIR) 10 MG tablet Take  10 mg by mouth every evening.     . pravastatin (PRAVACHOL) 20 MG tablet TAKE 1 TABLET BY MOUTH EVERY DAY 90 tablet 1  . Probiotic Product (PROBIOTIC PO) Take 1 capsule by mouth daily.    . SYMBICORT 160-4.5 MCG/ACT inhaler Inhale 2 puffs into the lungs 2 (two) times daily.    Marland Kitchen sulfamethoxazole-trimethoprim (BACTRIM DS) 800-160 MG tablet Take 1 tablet by mouth 2 (two) times daily. 10 tablet 0  . albuterol (PROVENTIL) (2.5 MG/3ML) 0.083% nebulizer solution Take 3 mLs (2.5  mg total) by nebulization every 4 (four) hours as needed for wheezing or shortness of breath (Dx: J45.50). (Patient not taking: Reported on 09/28/2018) 75 mL 1  . albuterol (VENTOLIN HFA) 108 (90 Base) MCG/ACT inhaler INHALE 2 PUFFS INTO LUNGS EVERY 6 HOURS AS NEEDED FOR WHEEZING/SHORTNESS OF BREATH (Patient not taking: Reported on 09/28/2018) 18 Inhaler 0  . clobetasol ointment (TEMOVATE) 0.05 % APPLY TO AFFECTED AREA TWICE A DAY (Patient not taking: Reported on 09/10/2018) 60 g 1  . senna-docusate (SENOKOT-S) 8.6-50 MG tablet Take 2 tablets by mouth at bedtime. Hold if having loose stools (Patient not taking: Reported on 09/28/2018) 30 tablet 1   No facility-administered medications prior to visit.     Allergies  Allergen Reactions  . Adhesive [Tape] Other (See Comments)    Takes patient's skin off.  . Banana Diarrhea and Nausea Only  . Eggs Or Egg-Derived Products Diarrhea and Nausea Only  . Latex Other (See Comments)    sensativity  . Penicillins Rash and Other (See Comments)    Has had cephalosporins without incident  . Pine Swelling and Rash  . Rose Swelling and Rash    ROS As per HPI  PE: Blood pressure 132/72, pulse (!) 105, temperature 98.1 F (36.7 C), temperature source Temporal, resp. rate 16, height 5\' 1"  (1.549 m), weight 264 lb (119.7 kg), last menstrual period 08/10/2010, SpO2 94 %. Gen: Alert, tired-appearing but nontoxic.  Patient is oriented to person, place, time, and situation. AFFECT: pleasant,  lucid thought and speech.   LABS:    Chemistry      Component Value Date/Time   NA 139 08/16/2018 1141   K 4.0 08/16/2018 1141   CL 99 08/16/2018 1141   CO2 27 08/16/2018 1141   BUN 23 08/16/2018 1141   CREATININE 0.90 08/16/2018 1141   CREATININE 0.92 07/17/2018 1028      Component Value Date/Time   CALCIUM 8.7 (L) 08/16/2018 1141   ALKPHOS 76 08/16/2018 1141   AST 36 08/16/2018 1141   AST 38 07/17/2018 1028   ALT 26 08/16/2018 1141   ALT 28 07/17/2018 1028   BILITOT 0.3 08/16/2018 1141   BILITOT 0.4 07/17/2018 1028     CC UA today: (on AZO)--globally abnormal/uninterpretable.   IMPRESSION AND PLAN:  1) Recurrent UTI's: UA not interpretable due to AZO effect. Sent urine for c/s. Cipro 500 mg bid x 5d rx'd. Will start her on daily dose of 250 mg cipro for UTI prevention given her increased risk as stated in HPI.  An After Visit Summary was printed and given to the patient.  FOLLOW UP: Return if symptoms worsen or fail to improve.  Signed:  Crissie Sickles, MD           09/28/2018

## 2018-09-30 ENCOUNTER — Encounter: Payer: Self-pay | Admitting: Family Medicine

## 2018-09-30 LAB — URINE CULTURE
MICRO NUMBER:: 588534
SPECIMEN QUALITY:: ADEQUATE

## 2018-10-04 ENCOUNTER — Other Ambulatory Visit: Payer: Self-pay | Admitting: Family Medicine

## 2018-10-05 ENCOUNTER — Other Ambulatory Visit: Payer: Self-pay

## 2018-10-05 ENCOUNTER — Ambulatory Visit (INDEPENDENT_AMBULATORY_CARE_PROVIDER_SITE_OTHER): Payer: Medicare Other | Admitting: Family Medicine

## 2018-10-05 DIAGNOSIS — E7800 Pure hypercholesterolemia, unspecified: Secondary | ICD-10-CM

## 2018-10-05 DIAGNOSIS — E118 Type 2 diabetes mellitus with unspecified complications: Secondary | ICD-10-CM

## 2018-10-05 DIAGNOSIS — E039 Hypothyroidism, unspecified: Secondary | ICD-10-CM

## 2018-10-05 DIAGNOSIS — Z8542 Personal history of malignant neoplasm of other parts of uterus: Secondary | ICD-10-CM

## 2018-10-05 LAB — COMPREHENSIVE METABOLIC PANEL
ALT: 15 U/L (ref 0–35)
AST: 20 U/L (ref 0–37)
Albumin: 3.6 g/dL (ref 3.5–5.2)
Alkaline Phosphatase: 66 U/L (ref 39–117)
BUN: 21 mg/dL (ref 6–23)
CO2: 29 mEq/L (ref 19–32)
Calcium: 8.7 mg/dL (ref 8.4–10.5)
Chloride: 100 mEq/L (ref 96–112)
Creatinine, Ser: 0.82 mg/dL (ref 0.40–1.20)
GFR: 69.37 mL/min (ref >60.00)
Glucose, Bld: 216 mg/dL — ABNORMAL HIGH (ref 70–99)
Potassium: 4.5 mEq/L (ref 3.5–5.1)
Sodium: 140 mEq/L (ref 135–145)
Total Bilirubin: 0.4 mg/dL (ref 0.2–1.2)
Total Protein: 7.2 g/dL (ref 6.0–8.3)

## 2018-10-05 LAB — CBC WITH DIFFERENTIAL/PLATELET
Basophils Absolute: 0.1 10*3/uL (ref 0.0–0.1)
Basophils Relative: 1.2 % (ref 0.0–3.0)
Eosinophils Absolute: 0.4 10*3/uL (ref 0.0–0.7)
Eosinophils Relative: 6.2 % — ABNORMAL HIGH (ref 0.0–5.0)
HCT: 28.4 % — ABNORMAL LOW (ref 36.0–46.0)
Hemoglobin: 9.2 g/dL — ABNORMAL LOW (ref 12.0–15.0)
Lymphocytes Relative: 16.4 % (ref 12.0–46.0)
Lymphs Abs: 1 10*3/uL (ref 0.7–4.0)
MCHC: 32.4 g/dL (ref 30.0–36.0)
MCV: 105.5 fl — ABNORMAL HIGH (ref 78.0–100.0)
Monocytes Absolute: 0.6 10*3/uL (ref 0.1–1.0)
Monocytes Relative: 10 % (ref 3.0–12.0)
Neutro Abs: 3.9 10*3/uL (ref 1.4–7.7)
Neutrophils Relative %: 66.2 % (ref 43.0–77.0)
Platelets: 128 10*3/uL — ABNORMAL LOW (ref 150.0–400.0)
RBC: 2.69 Mil/uL — ABNORMAL LOW (ref 3.87–5.11)
RDW: 15.9 % — ABNORMAL HIGH (ref 11.5–15.5)
WBC: 5.9 10*3/uL (ref 4.0–10.5)

## 2018-10-05 LAB — LIPID PANEL
Cholesterol: 136 mg/dL (ref 0–200)
HDL: 27.1 mg/dL — ABNORMAL LOW (ref >39.00)
NonHDL: 108.89
Total CHOL/HDL Ratio: 5
Triglycerides: 232 mg/dL — ABNORMAL HIGH (ref 0.0–149.0)
VLDL: 46.4 mg/dL — ABNORMAL HIGH (ref 0.0–40.0)

## 2018-10-05 LAB — HEMOGLOBIN A1C: Hgb A1c MFr Bld: 7.5 % — ABNORMAL HIGH (ref 4.6–6.5)

## 2018-10-05 LAB — TSH: TSH: 1.88 u[IU]/mL (ref 0.35–4.50)

## 2018-10-05 LAB — LDL CHOLESTEROL, DIRECT: Direct LDL: 78 mg/dL

## 2018-10-09 ENCOUNTER — Other Ambulatory Visit: Payer: Self-pay

## 2018-10-10 ENCOUNTER — Other Ambulatory Visit: Payer: Self-pay

## 2018-10-10 ENCOUNTER — Telehealth: Payer: Self-pay

## 2018-10-10 ENCOUNTER — Telehealth: Payer: Self-pay | Admitting: *Deleted

## 2018-10-10 MED ORDER — INSULIN NPH (HUMAN) (ISOPHANE) 100 UNIT/ML ~~LOC~~ SUSP: SUBCUTANEOUS | 5 refills | Status: DC

## 2018-10-10 NOTE — Telephone Encounter (Signed)
My Chart message sent

## 2018-10-10 NOTE — Telephone Encounter (Signed)
CALLED PATIENT TO INFORM OF FU WITH DR. Denman George  11-16-18, SPOKE WITH PATIENT AND SHE IS AWARE OF THIS APPT.

## 2018-10-16 ENCOUNTER — Other Ambulatory Visit: Payer: Self-pay

## 2018-10-16 MED ORDER — ALBUTEROL SULFATE HFA 108 (90 BASE) MCG/ACT IN AERS: INHALATION_SPRAY | RESPIRATORY_TRACT | 2 refills | Status: DC

## 2018-10-24 ENCOUNTER — Other Ambulatory Visit: Payer: Self-pay

## 2018-10-24 MED ORDER — PRAVASTATIN SODIUM 20 MG PO TABS: 20.0000 mg | ORAL_TABLET | Freq: Every day | ORAL | 0 refills | Status: DC

## 2018-10-24 MED ORDER — METFORMIN HCL 500 MG PO TABS: ORAL_TABLET | ORAL | 1 refills | Status: DC

## 2018-11-13 ENCOUNTER — Other Ambulatory Visit: Payer: Self-pay

## 2018-11-13 MED ORDER — SYMBICORT 160-4.5 MCG/ACT IN AERO: 2.0000 | INHALATION_SPRAY | Freq: Two times a day (BID) | RESPIRATORY_TRACT | 1 refills | Status: DC

## 2018-11-14 ENCOUNTER — Other Ambulatory Visit: Payer: Self-pay | Admitting: Family Medicine

## 2018-11-16 ENCOUNTER — Inpatient Hospital Stay: Payer: Medicare Other | Attending: Obstetrics | Admitting: Gynecologic Oncology

## 2018-11-16 ENCOUNTER — Encounter: Payer: Self-pay | Admitting: Gynecologic Oncology

## 2018-11-16 ENCOUNTER — Other Ambulatory Visit: Payer: Self-pay

## 2018-11-16 VITALS — BP 151/43 | HR 98 | Temp 98.5°F | Resp 17 | Ht 61.0 in | Wt 260.2 lb

## 2018-11-16 DIAGNOSIS — M5136 Other intervertebral disc degeneration, lumbar region: Secondary | ICD-10-CM | POA: Diagnosis not present

## 2018-11-16 DIAGNOSIS — Z90710 Acquired absence of both cervix and uterus: Secondary | ICD-10-CM | POA: Diagnosis not present

## 2018-11-16 DIAGNOSIS — E039 Hypothyroidism, unspecified: Secondary | ICD-10-CM | POA: Insufficient documentation

## 2018-11-16 DIAGNOSIS — C541 Malignant neoplasm of endometrium: Secondary | ICD-10-CM | POA: Insufficient documentation

## 2018-11-16 DIAGNOSIS — E669 Obesity, unspecified: Secondary | ICD-10-CM | POA: Diagnosis not present

## 2018-11-16 DIAGNOSIS — N904 Leukoplakia of vulva: Secondary | ICD-10-CM | POA: Diagnosis not present

## 2018-11-16 DIAGNOSIS — Z794 Long term (current) use of insulin: Secondary | ICD-10-CM | POA: Insufficient documentation

## 2018-11-16 DIAGNOSIS — Z90722 Acquired absence of ovaries, bilateral: Secondary | ICD-10-CM | POA: Diagnosis not present

## 2018-11-16 DIAGNOSIS — Z923 Personal history of irradiation: Secondary | ICD-10-CM | POA: Diagnosis not present

## 2018-11-16 DIAGNOSIS — Z79899 Other long term (current) drug therapy: Secondary | ICD-10-CM | POA: Diagnosis not present

## 2018-11-16 DIAGNOSIS — Z9221 Personal history of antineoplastic chemotherapy: Secondary | ICD-10-CM | POA: Diagnosis not present

## 2018-11-16 DIAGNOSIS — E119 Type 2 diabetes mellitus without complications: Secondary | ICD-10-CM | POA: Insufficient documentation

## 2018-11-16 DIAGNOSIS — I10 Essential (primary) hypertension: Secondary | ICD-10-CM | POA: Diagnosis not present

## 2018-11-16 DIAGNOSIS — K219 Gastro-esophageal reflux disease without esophagitis: Secondary | ICD-10-CM | POA: Diagnosis not present

## 2018-11-16 MED ORDER — CLOBETASOL PROPIONATE 0.05 % EX OINT: TOPICAL_OINTMENT | Freq: Every day | CUTANEOUS | Status: DC

## 2018-11-16 NOTE — Progress Notes (Signed)
[place] at [facility]   Follow-up Note: New Patient  Consult was initially requested by Dr. Hale Bogus for Grade 1 Endometrial Adenocarcinoma   Chief Complaint  Patient presents with  . endometrial cancer    follow-up     Assessment  Endometrial Cancer: stage IIIA grade 1 endometrioid (MMR normal, MSI stable) s/p staging surgery on 02/19/18. S/p adjuvant carboplatin and paclitaxel x 6 completed April, 2020. S/p adjuvant vaginal brachy completed February, 2020.   No evidence of recurrence.  Lichen sclerosis   Plan  Recommend ongoing 3 montly surveillance until April, 2022, then 6 monthly surveillance until April, 2025.  Recommend nightly clobetasol ointment to vulva for 12 weeks.   HPI: [name]  is a 68 y.o.  P2, - a retired Marine scientist  She had a h/o complex hyperplasia without atypia and was treated with a Mirena IUD x 5 years. ~2017 this was removed and she was noted to have followup biopsies with no residual hyperplasia. Then in July 2019 she had spotting for a few days. This resolved and then she noted another episode early Oct 2019. She presented to Dr. Sabra Heck and a TVUS was perfomed showing a thickened EM stripe with an area showing vascular flow. Therefore an EMB was performed and that revealed: Endometrium, biopsy - ENDOMETRIOID ADENOCARCINOMA, FIGO GRADE 1. - SEE COMMENT. Microscopic Comment Internal departmental review obtained (Dr. Lyndon Code) with agreement. Results are phoned to [facility] in the office of Dr. Edwinna Areola. (MEG:ah 01/31/18) [name] MD  She was therefore referred for management and recommendations  NOTABLE is her obesity. She states since losing her son, who died at 33 years old as complications from his Tetrology of Fallot, she has been overweight.   Adhesive allergy reviewed - She has done OK with steristrips after knee surgery PCN allergy reviewed - she has used Keflex without issue  States  last HgbA1C was 7.3 12/2017  Imported EPIC Oncologic History:  Oncology History Overview Note  Endometrioid MSI stable   Endometrial cancer ([place])  01/24/2018 Initial Diagnosis   Patient was seen by GYN for PMB since July 2019.  Pt has hx of complex endometrial hyperplasia without atypica that was treated with Mirena IUD for 5 years.  This was removed about two years ago.  She did have follow up biopsies after placement and did have resolution of hyperplasia.    Patient's last menstrual period was 08/10/2010.   01/25/2018 Pathology Results   Endometrium, biopsy - ENDOMETRIOID ADENOCARCINOMA, FIGO GRADE 1. - SEE COMMENT.   01/25/2018 Procedure   She had endometrial biopsy in GYN office   01/25/2018 Imaging   US pelvis  UTERUS: 8.5 x 5.3 x 4.5cm EMS: 18-61m, areas of vascular flow noted as well ADNEXA: Left ovary: not seen transvaginally or transabdominally due to habitus                  Right ovary: same as with left ovary CUL DE SAC: no free fluid    02/19/2018 Pathology Results   Uterus +/- tubes/ovaries, neoplastic, cervix - UTERUS: -ENDO/MYOMETRIUM: INVASIVE ENDOMETRIOID ADENOCARCINOMA WITH FOCAL SQUAMOUS DIFFERENTIATION (FIGO GRADE 1), SPANNING 5.8 CM. TUMOR INVADES LESS THAN ONE HALF OF THE MYOMETRIUM. TUMOR INVOLVES CERVICAL STROMA AND BILATERAL FALLOPIAN TUBE LUMEN. SEE ONCOLOGY TABLE. -SEROSA: UNREMARKABLE. NO MALIGNANCY. - CERVIX: STROMAL INVOLVEMENT BY ENDOMETRIOID ADENOCARCINOMA. - BILATERAL OVARIES: INCLUSION CYSTS. NO MALIGNANCY. - BILATERAL FALLOPIAN TUBES: LUMEN INVOLVEMENT BY ENDOMETRIOID ADENOCARCINOMA. Microscopic Comment UTERUS, CARCINOMA OR CARCINOSARCOMA  Procedure: Total hysterectomy with bilateral salpingo-oophorectomy. Histologic type: Endometrioid adenocarcinoma with squamous differentiation. Histologic Grade: FIGO grade 1. Myometrial invasion: Depth of invasion: 3 mm Myometrial thickness: 17 mm Uterine Serosa Involvement: Not  identified. Cervical stromal involvement: Present. Extent of involvement of other organs: Involves cervical stroma and lumen of bilateral fallopian tubes. Lymphovascular invasion: Not identified. Regional Lymph Nodes: None examined. Tumor block for ancillary studies: 1D-G MMR / MSI testing: Will be ordered.   02/19/2018 Surgery   Surgeon: Donaciano Eva   Operation: Robotic-assisted laparoscopic total hysterectomy with bilateral salpingoophorectomy (22 modifier for extreme obesity - see details in body of operative note)   Operative Findings:  : extreme intraperitoneal adiposity which prevented retroperitoneal visualization of the lymph node basins and increased the duration of the procedure by 1 hour (for additional instrumentation, placement of retraction sutures, additional personnel required for assistance.  8cm uterus, grossly normal ovaries, surgically interrupted tubes   03/05/2018 Imaging   1. Upper normal pelvic sidewall lymph nodes bilaterally. Close attention on follow-up recommended. 2. No other findings to suggest metastatic disease in the chest, abdomen, or pelvis. 3.  Aortic Atherosclerois (ICD10-170.0)    03/12/2018 Cancer Staging   Staging form: Corpus Uteri - Carcinoma and Carcinosarcoma, AJCC 8th Edition - Pathologic: Stage III (pT3, pN0, cM0) - Signed by Heath Lark, MD on 03/12/2018   03/16/2018 Procedure   Ultrasound and fluoroscopically guided right internal jugular single lumen power port catheter insertion. Tip in the SVC/RA junction. Catheter ready for use.   03/20/2018 - 07/17/2018 Chemotherapy   The patient had carboplatin and taxol x 6 cycles   04/24/2018 - 05/21/2018 Radiation Therapy   Radiation treatment dates:   04/24/18, 05/03/18, 05/07/18, 05/16/18, 05/21/18  Site/dose:  Vaginal cuff, 6 Gy in 5 fractions for a total dose of 30 Gy  Beams/energy:   HDR Ir-Vaginal, Iridium HDR,patient was treated with a 2.5 cm diameter segmented cylinder,  Rxlength of 3 cm, prescription was 6 gray to the mucosal surface    08/16/2018 Imaging   1. Status post hysterectomy. No CT evidence of metastatic disease in the abdomen or pelvis. Unchanged appearance of subcentimeter pelvic sidewall lymph nodes.  2. Other chronic, incidental, and postoperative findings as detailed above.   09/20/2018 Procedure   Removal of implanted Port-A-Cath utilizing sharp and blunt dissection. The procedure was uncomplicated     Measurement of disease: . TBD  Radiology: . 01/25/18 - TVUS  OSH - UTERUS: 8.5 x 5.3 x 4.5cm EMS: 18-81m, areas of vascular flow noted as well ADNEXA: Left ovary: not seen transvaginally or transabdominally due to habitus Right ovary: same as with left ovary CUL DE SAC: no free fluid . CT chest/abd/pelvis on 03/05/18 (postop) showed upper limit size pelvic nodes, but no other signs of metastatic disease.     Interval Hx:  Since completing chemotherapy in April, 2020 she has had persistent neuropathy, fatigue and vulvar irritation.   Outpatient Encounter Medications as of 11/16/2018  Medication Sig  . albuterol (PROVENTIL) (2.5 MG/3ML) 0.083% nebulizer solution Take 3 mLs (2.5 mg total) by nebulization every 4 (four) hours as needed for wheezing or shortness of breath (Dx: J45.50). (Patient not taking: Reported on 09/28/2018)  . albuterol (VENTOLIN HFA) 108 (90 Base) MCG/ACT inhaler INHALE 2 PUFFS INTO LUNGS EVERY 6 HOURS AS NEEDED FOR WHEEZING/SHORTNESS OF BREATH  . ciprofloxacin (CIPRO) 250 MG tablet Take 1 tablet (250 mg total) by mouth daily.  . clobetasol ointment (TEMOVATE) 0.05 % APPLY TO AFFECTED AREA TWICE A  DAY (Patient not taking: Reported on 09/10/2018)  . felodipine (PLENDIL) 10 MG 24 hr tablet TAKE 1 TABLET BY MOUTH EVERY DAY  . fluticasone (CUTIVATE) 0.05 % cream APPLY TO AFFECTED AREA OF R LOWER LEG TWICE PER DAY.  . fluticasone (FLONASE SENSIMIST) 27.5 MCG/SPRAY nasal spray Place 1 spray into the nose daily as needed for  rhinitis.  . furosemide (LASIX) 20 MG tablet Take 1 tablet (20 mg total) by mouth daily.  Marland Kitchen HYDROcodone-acetaminophen (NORCO/VICODIN) 5-325 MG tablet 1-2 tabs po bid prn pain  . insulin aspart (NOVOLOG) 100 UNIT/ML injection USE PER SLIDING SCALE AT [facility] MEAL (AVG USE IS 17 UNITS THREE TIMES DAILY)  . insulin NPH Human (NOVOLIN N) 100 UNIT/ML injection 67 U SQ qAM, 67 U SQ mid day, and 67 U SQ q evening  . Insulin Syringes, Disposable, U-100 0.3 ML MISC Use to inject insulin--6 injections per day  . levocetirizine (XYZAL) 5 MG tablet Take 5 mg by mouth at bedtime as needed for allergies.   Marland Kitchen levothyroxine (SYNTHROID) 125 MCG tablet TAKE 1 TABLET BY MOUTH EVERY MORNING ON EMPTY STOMACH  . meloxicam (MOBIC) 15 MG tablet TAKE 1 TABLET BY MOUTH EVERY DAY AS NEEDED  . metFORMIN (GLUCOPHAGE) 500 MG tablet Take 1 tablet by mouth every morning and 2 tablets by mouth at bedtime.  . miconazole (MICRO GUARD) 2 % powder Apply 1 application topically 2 (two) times daily as needed for itching.  . montelukast (SINGULAIR) 10 MG tablet Take 10 mg by mouth every evening.   . pravastatin (PRAVACHOL) 20 MG tablet Take 1 tablet (20 mg total) by mouth daily.  . Probiotic Product (PROBIOTIC PO) Take 1 capsule by mouth daily.  Marland Kitchen senna-docusate (SENOKOT-S) 8.6-50 MG tablet Take 2 tablets by mouth at bedtime. Hold if having loose stools (Patient not taking: Reported on 09/28/2018)  . SYMBICORT 160-4.5 MCG/ACT inhaler Inhale 2 puffs into the lungs 2 (two) times daily.  . [DISCONTINUED] prochlorperazine (COMPAZINE) 10 MG tablet Take 1 tablet (10 mg total) by mouth every 6 (six) hours as needed (Nausea or vomiting).   Facility-Administered Encounter Medications as of 11/16/2018  Medication  . clobetasol ointment (TEMOVATE) 0.05 %   Allergies  Allergen Reactions  . Adhesive [Tape] Other (See Comments)    Takes patient's skin off.  . Banana Diarrhea and Nausea Only  . Eggs Or Egg-Derived Products Diarrhea and Nausea Only   . Latex Other (See Comments)    sensativity  . Penicillins Rash and Other (See Comments)    Has had cephalosporins without incident  . Pine Swelling and Rash  . Rose Swelling and Rash    Past Medical History:  Diagnosis Date  . Allergic rhinitis    Allergy testing: results pending as of 05/28/16 (Dr. Harold Hedge)  . Anemia   . Arthritis   . Chemotherapy-induced neuropathy ([place]) 2019  . Cough variant asthma   . DDD (degenerative disc disease), lumbar   . Diabetes mellitus with complication ([place])    Mild nonprolif DR R eye 12/2017-->referred to retinal specialist  . Endometrial cancer ([place]) 02/2018   REMISSION AS OF 08/2018.  Stage III (T3, Nx, Mx)  Path: endometroid adenocarcinoma involving cervix, uterus, and fallopian tubes (Pt got TAH & BSO 02/2018).  . GERD (gastroesophageal reflux disease)   . Hemorrhoids   . Herpes zoster 10/2015   L side belt-line  . History of adenomatous polyp of colon 02/25/13   5 mm cecal polyp removed by Dr. Trevor Mace 5 yrs  .  History of blood transfusion   . History of cellulitis 08/2010   Left (Since replacemnt of Left knee  . Hyperkalemia 03/2018   started after pt started getting chemo-->had to stop enalapril.  Marland Kitchen Hyperlipidemia    Not on statin b/c lipids "stayed down when sugars came down" per pt.  She says Dr. Chalmers Cater knows she is not on statin anymore.  . Hypertension   . Hypothyroidism   . Nephrolithiasis 4/07  . Obesity   . OSA on CPAP   . Osteoarthritis    knees, ankle, + ? scapholunate ligament disruption (x-ray 06/2017)--ortho referral.  . Pancytopenia due to antineoplastic chemotherapy ([place])    progressive as of 06/2018. Stable 08/2018.  Marland Kitchen Recurrent UTI    started cipro 250 qd 09/2018  . Sciatica of left side   . Severe persistent asthma    cough-variant--saw Allergist 05/25/16 and was switched from max dose advair to symbicort.  . Systolic murmur    ECHO fine 10/2016-->murmur likely flow murmur assoc with HTN.  Marland Kitchen Zoon's vulvitis     Bx-proven (GYN) -lichen sclerosis.  Clobetasol 0.05% ointment per GYN   Past Surgical History:  Procedure Laterality Date  . ANKLE FRACTURE SURGERY  1984   Pin & repair  . ARTHROSCOPIC REPAIR ACL  2000   Due to ACL tear  . ARTHROSCOPIC REPAIR ACL  5/08  . Blateral knee rerlacements x4 2 times each knee    . BREAST BIOPSY Right 2014   Benign  . CARDIAC CATHETERIZATION  5/07   clear vessel mild mitral   . CARPAL TUNNEL RELEASE Right [id number]   1989 Left  . CESAREAN SECTION  1985  . CHOLECYSTECTOMY OPEN  1988  . COLONOSCOPY N/A 02/25/2013   Tubular adenoma x 1: Recall 5 yrs. Procedure: COLONOSCOPY;  Surgeon: Juanita Craver, MD;  Location: [facility];  Service: Endoscopy;  Laterality: N/A;  . COMBINED HYSTEROSCOPY DIAGNOSTIC / D&C  2/12   Bx neg  . DEXA  08/2007   Bone density normal.  . DILATION AND CURETTAGE OF UTERUS  12/11  . IR IMAGING GUIDED PORT INSERTION  03/16/2018  . IR REMOVAL TUN ACCESS W/ PORT W/O FL MOD SED  09/20/2018  . LYMPH NODE BIOPSY N/A 02/19/2018   Procedure: Sentinel LYMPH NODE BIOPSY;  Surgeon: Everitt Amber, MD;  Location: [facility];  Service: Gynecology;  Laterality: N/A;  . REPLACEMENT TOTAL KNEE Left 5/12   X 2 on each  . ROBOTIC ASSISTED TOTAL HYSTERECTOMY WITH BILATERAL SALPINGO OOPHERECTOMY N/A 02/19/2018   For endometrial cancer.  Procedure: XI ROBOTIC ASSISTED TOTAL HYSTERECTOMY WITH BILATERAL SALPINGO OOPHORECTOMY;  Surgeon: Everitt Amber, MD;  Location: [place];  Service: Gynecology;  Laterality: N/A;  . TRANSTHORACIC ECHOCARDIOGRAM  10/11/2016    EF 55-60%, grd I DD.  . TUBAL LIGATION  1985   C-Section        Past Gynecological History:   GYNECOLOGIC HISTORY:  . Patient's last menstrual period was 08/10/2010.  . Menarche: 68 years old . P 2 . Contraceptive h/o Mirena IUD . HRT None  . Last Pap 10/2016 negative Family Hx:  Family History  Problem Relation Age of Onset  . Diabetes Father   . COPD Father    . Stroke Father   . Celiac disease Brother   . Rheum arthritis Brother   . Thyroid disease Brother   . Diabetes Brother   . Alzheimer's disease Mother   . Arthritis Mother   . Other Son  Died age 25 -Tetrology of Fallot - VSD/pulmonary atresia  . Breast cancer Other        postmenopausal when diagnosed   Social Hx:  Marland Kitchen Tobacco use: none . Alcohol use: none . Illicit Drug use: none . Illicit IV Drug use: none    Review of Systems: Review of Systems  Constitutional: Positive for fatigue.  Genitourinary:        Vulvodynia  Neurological: Positive for numbness.       Foot neuropathy pain  All other systems reviewed and are negative.   Vitals:  Vitals:   11/16/18 1332  BP: (!) 151/43  Pulse: 98  Resp: 17  Temp: 98.5 F (36.9 C)  SpO2: 97%   Vitals:   11/16/18 1332  Weight: 260 lb 3.2 oz (118 kg)  Height: '5\' 1"'  (1.549 m)   Body mass index is 49.16 kg/m.  Physical Exam: General :  Obese. Well developed, 68 y.o., female in no apparent distress HEENT:  Normocephalic/atraumatic, symmetric, EOMI, eyelids normal Neck:   Supple, no masses.  Lymphatics:  No cervical/ submandibular/ supraclavicular/ infraclavicular/ inguinal adenopathy Respiratory:  Respirations unlabored, no use of accessory muscles CV:   Deferred Breast:  Deferred Musculoskeletal: No CVA tenderness, normal muscle strength. Abdomen:  Obese with pannus. Soft, non-tender and nondistended. No evidence of hernia. No masses. Extremities:  No lymphedema, no erythema, non-tender. Skin:   Normal inspection; candida at mons/groin Neuro/Psych:  No focal motor deficit, no abnormal mental status. Normal gait. Normal affect. Alert and oriented to person, place, and time  Genito Urinary: vaginal cuff in tact no lesions or masses. White changes to butterfly distribution consistent with lichen sclerosis.   Cc: M. Edwinna Areola, MD (Referring Ob/Gyn) Anitra Lauth Adrian Blackwater, MD (PCP)

## 2018-11-16 NOTE — Patient Instructions (Signed)
Please notify Dr Denman George at phone number [phone] if you notice vaginal bleeding, new pelvic or abdominal pains, bloating, feeling full easy, or a change in bladder or bowel function.   Please have Dr Clabe Seal office contact Dr Serita Grit office (at [phone]) in November to request an appointment with her for February, 2021.  Dr Denman George has prescribed clobetasol to apply to your vulva every night for 12 weeks to help with vulvar irritation.

## 2018-11-19 ENCOUNTER — Other Ambulatory Visit: Payer: Self-pay | Admitting: Family Medicine

## 2018-11-19 MED ORDER — CLOBETASOL PROPIONATE 0.05 % EX OINT: 1.0000 "application " | TOPICAL_OINTMENT | Freq: Every day | CUTANEOUS | 3 refills | Status: DC

## 2018-11-19 NOTE — Telephone Encounter (Signed)
Medication Refill - Medication: HYDROcodone-acetaminophen (NORCO/VICODIN) 5-325 MG tablet   Has the patient contacted their pharmacy? No  (Agent: If no, request that the patient contact the pharmacy for the refill.) (Agent: If yes, when and what did the pharmacy advise?)  Preferred Pharmacy (with phone number or street name):  [id number] [address], [place] - [place] [phone] (Phone) [phone] (Fax)     Agent: Please be advised that RX refills may take up to 3 business days. We ask that you follow-up with your pharmacy.

## 2018-11-19 NOTE — Addendum Note (Signed)
Addended by: Joylene John D on: 11/19/2018 10:54 AM   Modules accepted: Orders

## 2018-11-19 NOTE — Telephone Encounter (Signed)
Patient due for OV.  Scheduled for VV tomorrow.

## 2018-11-20 ENCOUNTER — Ambulatory Visit (INDEPENDENT_AMBULATORY_CARE_PROVIDER_SITE_OTHER): Payer: Medicare Other | Admitting: Family Medicine

## 2018-11-20 ENCOUNTER — Other Ambulatory Visit: Payer: Self-pay

## 2018-11-20 ENCOUNTER — Encounter: Payer: Self-pay | Admitting: Family Medicine

## 2018-11-20 VITALS — BP 158/69 | HR 75 | Temp 97.5°F | Resp 16

## 2018-11-20 DIAGNOSIS — M150 Primary generalized (osteo)arthritis: Secondary | ICD-10-CM | POA: Diagnosis not present

## 2018-11-20 DIAGNOSIS — M159 Polyosteoarthritis, unspecified: Secondary | ICD-10-CM

## 2018-11-20 DIAGNOSIS — G894 Chronic pain syndrome: Secondary | ICD-10-CM

## 2018-11-20 DIAGNOSIS — G622 Polyneuropathy due to other toxic agents: Secondary | ICD-10-CM | POA: Diagnosis not present

## 2018-11-20 NOTE — Progress Notes (Signed)
Virtual Visit via Video Note  I connected with pt on 11/20/18 at  9:00 AM EDT by a video enabled telemedicine application and verified that I am speaking with the correct person using two identifiers.  Location patient: home Location provider:work or home office Persons participating in the virtual visit: patient, provider  I discussed the limitations of evaluation and management by telemedicine and the availability of in person appointments. The patient expressed understanding and agreed to proceed.  Telemedicine visit is a necessity given the COVID-19 restrictions in place at the current time.  HPI: [name] being seen today for f/u chronic pain.  She reports she is doing pretty good.  Indication for chronic opioid: osteoarthritis low back, feet, L hip, knees, +chemotherapy -induced neuropathy (burning and numbness in feet). Medication and dose: Vicodin 5/325, 1-2 bid prn # pills per month: 90 NCCSRS reviewed this encounter (include red flags):  yes, no red flags.  Most recent vicodin rx fill was 08/22/18, #90, rx by me.  No suspicious activity on PMP AWARE.  -->Uses vicodin almost exclusively at night-time b/c this is when her pain is the worst and she can't sleep. No adverse side effects from the med.   ROS: See pertinent positives and negatives per HPI.  Past Medical History:  Diagnosis Date  . Allergic rhinitis    Allergy testing: results pending as of 05/28/16 (Dr. Harold Hedge)  . Anemia   . Arthritis   . Chemotherapy-induced neuropathy ([place]) 2019  . Cough variant asthma   . DDD (degenerative disc disease), lumbar   . Diabetes mellitus with complication ([place])    Mild nonprolif DR R eye 12/2017-->referred to retinal specialist  . Endometrial cancer ([place]) 02/2018   REMISSION AS OF 08/2018.  Stage III (T3, Nx, Mx)  Path: endometroid adenocarcinoma involving cervix, uterus, and fallopian tubes (Pt got TAH & BSO 02/2018).  . GERD (gastroesophageal reflux disease)   .  Hemorrhoids   . Herpes zoster 10/2015   L side belt-line  . History of adenomatous polyp of colon 02/25/13   5 mm cecal polyp removed by Dr. Trevor Mace 5 yrs  . History of blood transfusion   . History of cellulitis 08/2010   Left (Since replacemnt of Left knee  . Hyperkalemia 03/2018   started after pt started getting chemo-->had to stop enalapril.  Marland Kitchen Hyperlipidemia    Not on statin b/c lipids "stayed down when sugars came down" per pt.  She says Dr. Chalmers Cater knows she is not on statin anymore.  . Hypertension   . Hypothyroidism   . Nephrolithiasis 4/07  . Obesity   . OSA on CPAP   . Osteoarthritis    knees, ankle, + ? scapholunate ligament disruption (x-ray 06/2017)--ortho referral.  . Pancytopenia due to antineoplastic chemotherapy ([place])    progressive as of 06/2018. Stable 08/2018.  Marland Kitchen Recurrent UTI    started cipro 250 qd 09/2018  . Sciatica of left side   . Severe persistent asthma    cough-variant--saw Allergist 05/25/16 and was switched from max dose advair to symbicort.  . Systolic murmur    ECHO fine 10/2016-->murmur likely flow murmur assoc with HTN.  Marland Kitchen Zoon's vulvitis    Bx-proven (GYN) -lichen sclerosis.  Clobetasol 0.05% ointment per GYN    Past Surgical History:  Procedure Laterality Date  . ANKLE FRACTURE SURGERY  1984   Pin & repair  . ARTHROSCOPIC REPAIR ACL  2000   Due to ACL tear  . ARTHROSCOPIC REPAIR ACL  5/08  .  Blateral knee rerlacements x4 2 times each knee    . BREAST BIOPSY Right 2014   Benign  . CARDIAC CATHETERIZATION  5/07   clear vessel mild mitral   . CARPAL TUNNEL RELEASE Right [id number]   1989 Left  . CESAREAN SECTION  1985  . CHOLECYSTECTOMY OPEN  1988  . COLONOSCOPY N/A 02/25/2013   Tubular adenoma x 1: Recall 5 yrs. Procedure: COLONOSCOPY;  Surgeon: Juanita Craver, MD;  Location: [facility];  Service: Endoscopy;  Laterality: N/A;  . COMBINED HYSTEROSCOPY DIAGNOSTIC / D&C  2/12   Bx neg  . DEXA  08/2007   Bone density normal.  . DILATION  AND CURETTAGE OF UTERUS  12/11  . IR IMAGING GUIDED PORT INSERTION  03/16/2018  . IR REMOVAL TUN ACCESS W/ PORT W/O FL MOD SED  09/20/2018  . LYMPH NODE BIOPSY N/A 02/19/2018   Procedure: Sentinel LYMPH NODE BIOPSY;  Surgeon: Everitt Amber, MD;  Location: [facility];  Service: Gynecology;  Laterality: N/A;  . REPLACEMENT TOTAL KNEE Left 5/12   X 2 on each  . ROBOTIC ASSISTED TOTAL HYSTERECTOMY WITH BILATERAL SALPINGO OOPHERECTOMY N/A 02/19/2018   For endometrial cancer.  Procedure: XI ROBOTIC ASSISTED TOTAL HYSTERECTOMY WITH BILATERAL SALPINGO OOPHORECTOMY;  Surgeon: Everitt Amber, MD;  Location: [place];  Service: Gynecology;  Laterality: N/A;  . TRANSTHORACIC ECHOCARDIOGRAM  10/11/2016    EF 55-60%, grd I DD.  . TUBAL LIGATION  1985   C-Section    Family History  Problem Relation Age of Onset  . Diabetes Father   . COPD Father   . Stroke Father   . Celiac disease Brother   . Rheum arthritis Brother   . Thyroid disease Brother   . Diabetes Brother   . Alzheimer's disease Mother   . Arthritis Mother   . Other Son        Died age 92 -Tetrology of Fallot - VSD/pulmonary atresia  . Breast cancer Other        postmenopausal when diagnosed     Current Outpatient Medications:  .  albuterol (VENTOLIN HFA) 108 (90 Base) MCG/ACT inhaler, INHALE 2 PUFFS INTO LUNGS EVERY 6 HOURS AS NEEDED FOR WHEEZING/SHORTNESS OF BREATH, Disp: 18 g, Rfl: 2 .  ciprofloxacin (CIPRO) 250 MG tablet, Take 1 tablet (250 mg total) by mouth daily., Disp: 30 tablet, Rfl: 6 .  clobetasol ointment (TEMOVATE) 4.76 %, Apply 1 application topically at bedtime. Apply to the skin of the vulva for 12 weeks, Disp: 30 g, Rfl: 3 .  felodipine (PLENDIL) 10 MG 24 hr tablet, TAKE 1 TABLET BY MOUTH EVERY DAY, Disp: 90 tablet, Rfl: 1 .  fluticasone (CUTIVATE) 0.05 % cream, APPLY TO AFFECTED AREA OF R LOWER LEG TWICE PER DAY., Disp: 30 g, Rfl: 0 .  fluticasone (FLONASE SENSIMIST) 27.5 MCG/SPRAY nasal  spray, Place 1 spray into the nose daily as needed for rhinitis., Disp: , Rfl:  .  furosemide (LASIX) 20 MG tablet, Take 1 tablet (20 mg total) by mouth daily., Disp: 90 tablet, Rfl: 1 .  HYDROcodone-acetaminophen (NORCO/VICODIN) 5-325 MG tablet, 1-2 tabs po bid prn pain, Disp: 90 tablet, Rfl: 0 .  insulin aspart (NOVOLOG) 100 UNIT/ML injection, USE PER SLIDING SCALE AT EACH MEAL (AVG USE IS 17 UNITS THREE TIMES DAILY), Disp: 50 mL, Rfl: 2 .  insulin NPH Human (NOVOLIN N) 100 UNIT/ML injection, 67 U SQ qAM, 67 U SQ mid day, and 67 U SQ q evening, Disp: 50 mL, Rfl:  5 .  Insulin Syringes, Disposable, U-100 0.3 ML MISC, Use to inject insulin--6 injections per day, Disp: 540 each, Rfl: 3 .  levocetirizine (XYZAL) 5 MG tablet, Take 5 mg by mouth at bedtime as needed for allergies. , Disp: , Rfl: 5 .  levothyroxine (SYNTHROID) 125 MCG tablet, TAKE 1 TABLET BY MOUTH EVERY MORNING ON EMPTY STOMACH, Disp: 90 tablet, Rfl: 0 .  meloxicam (MOBIC) 15 MG tablet, TAKE 1 TABLET BY MOUTH EVERY DAY AS NEEDED, Disp: 30 tablet, Rfl: 3 .  metFORMIN (GLUCOPHAGE) 500 MG tablet, Take 1 tablet by mouth every morning and 2 tablets by mouth at bedtime., Disp: 270 tablet, Rfl: 1 .  miconazole (MICRO GUARD) 2 % powder, Apply 1 application topically 2 (two) times daily as needed for itching., Disp: , Rfl:  .  montelukast (SINGULAIR) 10 MG tablet, Take 10 mg by mouth every evening. , Disp: , Rfl:  .  pravastatin (PRAVACHOL) 20 MG tablet, Take 1 tablet (20 mg total) by mouth daily., Disp: 90 tablet, Rfl: 0 .  Probiotic Product (PROBIOTIC PO), Take 1 capsule by mouth daily., Disp: , Rfl:  .  SYMBICORT 160-4.5 MCG/ACT inhaler, Inhale 2 puffs into the lungs 2 (two) times daily., Disp: 1 Inhaler, Rfl: 1 .  albuterol (PROVENTIL) (2.5 MG/3ML) 0.083% nebulizer solution, Take 3 mLs (2.5 mg total) by nebulization every 4 (four) hours as needed for wheezing or shortness of breath (Dx: J45.50). (Patient not taking: Reported on 11/20/2018),  Disp: 75 mL, Rfl: 1 .  senna-docusate (SENOKOT-S) 8.6-50 MG tablet, Take 2 tablets by mouth at bedtime. Hold if having loose stools (Patient not taking: Reported on 09/28/2018), Disp: 30 tablet, Rfl: 1  EXAM:  VITALS per patient if applicable:  GENERAL: alert, oriented, appears well and in no acute distress  HEENT: atraumatic, conjunttiva clear, no obvious abnormalities on inspection of external nose and ears  NECK: normal movements of the head and neck  LUNGS: on inspection no signs of respiratory distress, breathing rate appears normal, no obvious gross SOB, gasping or wheezing  CV: no obvious cyanosis  MS: moves all visible extremities without noticeable abnormality  PSYCH/NEURO: pleasant and cooperative, no obvious depression or anxiety, speech and thought processing grossly intact  LABS: none today    Chemistry      Component Value Date/Time   NA 140 10/05/2018 1040   K 4.5 10/05/2018 1040   CL 100 10/05/2018 1040   CO2 29 10/05/2018 1040   BUN 21 10/05/2018 1040   CREATININE 0.82 10/05/2018 1040   CREATININE 0.92 07/17/2018 1028      Component Value Date/Time   CALCIUM 8.7 10/05/2018 1040   ALKPHOS 66 10/05/2018 1040   AST 20 10/05/2018 1040   AST 38 07/17/2018 1028   ALT 15 10/05/2018 1040   ALT 28 07/17/2018 1028   BILITOT 0.4 10/05/2018 1040   BILITOT 0.4 07/17/2018 1028     Lab Results  Component Value Date   WBC 5.9 10/05/2018   HGB 9.2 (L) 10/05/2018   HCT 28.4 (L) 10/05/2018   MCV 105.5 (H) 10/05/2018   PLT 128.0 (L) 10/05/2018   Lab Results  Component Value Date   HGBA1C 7.5 (H) 10/05/2018    ASSESSMENT AND PLAN:  Discussed the following assessment and plan:  Chronic pain syndrome: The current medical regimen is effective;  continue present plan and medications. No new rx's given today.  I reminded pt that she still has two vicodin rx's on hold at the pharmacy so she'll  contact them to get one filled. Will get CSC signed and do UDS at next  f/u visit.  Discussed today's bp: she was rushing/in a hurry for this appt. Says home bp usually 130s/60s.  No changes made today.  Continue home bp/hr monitoring.  I discussed the assessment and treatment plan with the patient. The patient was provided an opportunity to ask questions and all were answered. The patient agreed with the plan and demonstrated an understanding of the instructions.   The patient was advised to call back or seek an in-person evaluation if the symptoms worsen or if the condition fails to improve as anticipated.  F/u: 2 mo [facility] telemedicine  Signed:  Crissie Sickles, MD           11/20/2018

## 2018-11-21 ENCOUNTER — Ambulatory Visit: Payer: Medicare Other | Admitting: Gynecologic Oncology

## 2018-12-06 ENCOUNTER — Other Ambulatory Visit: Payer: Self-pay | Admitting: Family Medicine

## 2018-12-20 DIAGNOSIS — Z1211 Encounter for screening for malignant neoplasm of colon: Secondary | ICD-10-CM | POA: Diagnosis not present

## 2018-12-20 DIAGNOSIS — Z86010 Personal history of colonic polyps: Secondary | ICD-10-CM | POA: Diagnosis not present

## 2018-12-20 DIAGNOSIS — K5900 Constipation, unspecified: Secondary | ICD-10-CM | POA: Diagnosis not present

## 2018-12-28 ENCOUNTER — Other Ambulatory Visit: Payer: Self-pay | Admitting: Family Medicine

## 2018-12-30 DIAGNOSIS — Z23 Encounter for immunization: Secondary | ICD-10-CM | POA: Diagnosis not present

## 2019-01-12 ENCOUNTER — Other Ambulatory Visit: Payer: Self-pay | Admitting: Family Medicine

## 2019-01-16 ENCOUNTER — Encounter: Payer: Self-pay | Admitting: Family Medicine

## 2019-01-16 ENCOUNTER — Ambulatory Visit (INDEPENDENT_AMBULATORY_CARE_PROVIDER_SITE_OTHER): Payer: Medicare Other | Admitting: Family Medicine

## 2019-01-16 ENCOUNTER — Other Ambulatory Visit: Payer: Self-pay

## 2019-01-16 VITALS — BP 146/64 | HR 72 | Temp 98.0°F | Resp 16 | Wt 258.0 lb

## 2019-01-16 DIAGNOSIS — G622 Polyneuropathy due to other toxic agents: Secondary | ICD-10-CM

## 2019-01-16 DIAGNOSIS — E118 Type 2 diabetes mellitus with unspecified complications: Secondary | ICD-10-CM

## 2019-01-16 DIAGNOSIS — G894 Chronic pain syndrome: Secondary | ICD-10-CM

## 2019-01-16 DIAGNOSIS — M8949 Other hypertrophic osteoarthropathy, multiple sites: Secondary | ICD-10-CM

## 2019-01-16 DIAGNOSIS — M159 Polyosteoarthritis, unspecified: Secondary | ICD-10-CM

## 2019-01-16 DIAGNOSIS — I10 Essential (primary) hypertension: Secondary | ICD-10-CM | POA: Diagnosis not present

## 2019-01-16 DIAGNOSIS — E782 Mixed hyperlipidemia: Secondary | ICD-10-CM | POA: Diagnosis not present

## 2019-01-16 DIAGNOSIS — D61810 Antineoplastic chemotherapy induced pancytopenia: Secondary | ICD-10-CM | POA: Diagnosis not present

## 2019-01-16 MED ORDER — HYDROCODONE-ACETAMINOPHEN 5-325 MG PO TABS: ORAL_TABLET | ORAL | 0 refills | Status: DC

## 2019-01-16 MED ORDER — MONTELUKAST SODIUM 10 MG PO TABS: 10.0000 mg | ORAL_TABLET | Freq: Every evening | ORAL | 3 refills | Status: DC

## 2019-01-16 MED ORDER — INSULIN NPH (HUMAN) (ISOPHANE) 100 UNIT/ML ~~LOC~~ SUSP: SUBCUTANEOUS | 5 refills | Status: DC

## 2019-01-16 MED ORDER — ALBUTEROL SULFATE HFA 108 (90 BASE) MCG/ACT IN AERS: INHALATION_SPRAY | RESPIRATORY_TRACT | 2 refills | Status: DC

## 2019-01-16 MED ORDER — LEVOTHYROXINE SODIUM 125 MCG PO TABS: ORAL_TABLET | ORAL | 3 refills | Status: DC

## 2019-01-16 NOTE — Progress Notes (Signed)
Virtual Visit via Video Note  I connected with pt on 01/16/19 at  1:30 PM EDT by a video enabled telemedicine application and verified that I am speaking with the correct person using two identifiers.  Location patient: home Location provider:work or home office Persons participating in the virtual visit: patient, provider  I discussed the limitations of evaluation and management by telemedicine and the availability of in person appointments. The patient expressed understanding and agreed to proceed.  Telemedicine visit is a necessity given the COVID-19 restrictions in place at the current time.  HPI: [name] being seen today for 3 mo f/u DM 2, HTN, HLD, and chronic pain f/u. Last visit all labs stable except HbA1c is up a little to 7.5%.  I recommended she Increase novolin N to 62 Units three times a day.   DM: 17 U mealtime insulin, 62 U N tid. Has had 5 low glucoses in the last 10d, mostly between 10 PM and 1 AM (down to 50 or so). Fasting gluc's in mornings usually 130-150.  HTN: only occ home bp check, usually 130s/80s.  HLD: tolerating pravastatin 20mg  qd.   Indication for chronic opioid: osteoarthritis low back, feet, L hip, knees, +chemotherapy -induced neuropathy (burning and numbness in feet). Medication and dose: Vicodin 5/325, 1-2 bid prn # pills per month: 90 NCCSRS reviewed this encounter (include red flags):  yes, no red flags.  Most recent vicodin rx fill was 11/20/18, #90, rx by me.  No suspicious activity.  -->Uses vicodin almost exclusively at night-time b/c this is when her pain is the worst and she can't sleep--takes 1 tab most days, rarely 2. Some periods of "flaring of pain" mainly assoc with weather changes and higher activity levels-->leads to increased pain.    ROS: no CP, no SOB, no wheezing, no cough, no dizziness, no HAs, no rashes, no melena/hematochezia.  No polyuria or polydipsia.   Past Medical History:  Diagnosis Date  . Allergic rhinitis    Allergy testing: results pending as of 05/28/16 (Dr. Harold Hedge)  . Anemia   . Arthritis   . Chemotherapy-induced neuropathy ([place]) 2019  . Cough variant asthma   . DDD (degenerative disc disease), lumbar   . Diabetes mellitus with complication ([place])    Mild nonprolif DR R eye 12/2017-->referred to retinal specialist  . Endometrial cancer ([place]) 02/2018   REMISSION AS OF 08/2018.  Stage III (T3, Nx, Mx)  Path: endometroid adenocarcinoma involving cervix, uterus, and fallopian tubes (Pt got TAH & BSO 02/2018).  . GERD (gastroesophageal reflux disease)   . Hemorrhoids   . Herpes zoster 10/2015   L side belt-line  . History of adenomatous polyp of colon 02/25/13   5 mm cecal polyp removed by Dr. Trevor Mace 5 yrs  . History of blood transfusion   . History of cellulitis 08/2010   Left (Since replacemnt of Left knee  . Hyperkalemia 03/2018   started after pt started getting chemo-->had to stop enalapril.  Marland Kitchen Hyperlipidemia    Not on statin b/c lipids "stayed down when sugars came down" per pt.  She says Dr. Chalmers Cater knows she is not on statin anymore.  . Hypertension   . Hypothyroidism   . Nephrolithiasis 4/07  . Obesity   . OSA on CPAP   . Osteoarthritis    knees, ankle, + ? scapholunate ligament disruption (x-ray 06/2017)--ortho referral.  . Pancytopenia due to antineoplastic chemotherapy ([place])    progressive as of 06/2018. Stable 08/2018.  Marland Kitchen Recurrent UTI  started cipro 250 qd 09/2018  . Sciatica of left side   . Severe persistent asthma    cough-variant--saw Allergist 05/25/16 and was switched from max dose advair to symbicort.  . Systolic murmur    ECHO fine 10/2016-->murmur likely flow murmur assoc with HTN.  Marland Kitchen Zoon's vulvitis    Bx-proven (GYN) -lichen sclerosis.  Clobetasol 0.05% ointment per GYN    Past Surgical History:  Procedure Laterality Date  . ANKLE FRACTURE SURGERY  1984   Pin & repair  . ARTHROSCOPIC REPAIR ACL  2000   Due to ACL tear  . ARTHROSCOPIC REPAIR ACL  5/08   . Blateral knee rerlacements x4 2 times each knee    . BREAST BIOPSY Right 2014   Benign  . CARDIAC CATHETERIZATION  5/07   clear vessel mild mitral   . CARPAL TUNNEL RELEASE Right [id number]   1989 Left  . CESAREAN SECTION  1985  . CHOLECYSTECTOMY OPEN  1988  . COLONOSCOPY N/A 02/25/2013   Tubular adenoma x 1: Recall 5 yrs. Procedure: COLONOSCOPY;  Surgeon: Juanita Craver, MD;  Location: [facility];  Service: Endoscopy;  Laterality: N/A;  . COMBINED HYSTEROSCOPY DIAGNOSTIC / D&C  2/12   Bx neg  . DEXA  08/2007   Bone density normal.  . DILATION AND CURETTAGE OF UTERUS  12/11  . IR IMAGING GUIDED PORT INSERTION  03/16/2018  . IR REMOVAL TUN ACCESS W/ PORT W/O FL MOD SED  09/20/2018  . LYMPH NODE BIOPSY N/A 02/19/2018   Procedure: Sentinel LYMPH NODE BIOPSY;  Surgeon: Everitt Amber, MD;  Location: [facility];  Service: Gynecology;  Laterality: N/A;  . REPLACEMENT TOTAL KNEE Left 5/12   X 2 on each  . ROBOTIC ASSISTED TOTAL HYSTERECTOMY WITH BILATERAL SALPINGO OOPHERECTOMY N/A 02/19/2018   For endometrial cancer.  Procedure: XI ROBOTIC ASSISTED TOTAL HYSTERECTOMY WITH BILATERAL SALPINGO OOPHORECTOMY;  Surgeon: Everitt Amber, MD;  Location: [place];  Service: Gynecology;  Laterality: N/A;  . TRANSTHORACIC ECHOCARDIOGRAM  10/11/2016    EF 55-60%, grd I DD.  . TUBAL LIGATION  1985   C-Section    Family History  Problem Relation Age of Onset  . Diabetes Father   . COPD Father   . Stroke Father   . Celiac disease Brother   . Rheum arthritis Brother   . Thyroid disease Brother   . Diabetes Brother   . Alzheimer's disease Mother   . Arthritis Mother   . Other Son        Died age 61 -Tetrology of Fallot - VSD/pulmonary atresia  . Breast cancer Other        postmenopausal when diagnosed     Current Outpatient Medications:  .  albuterol (PROVENTIL) (2.5 MG/3ML) 0.083% nebulizer solution, Take 3 mLs (2.5 mg total) by nebulization every 4 (four) hours as  needed for wheezing or shortness of breath (Dx: J45.50). (Patient not taking: Reported on 11/20/2018), Disp: 75 mL, Rfl: 1 .  albuterol (VENTOLIN HFA) 108 (90 Base) MCG/ACT inhaler, INHALE 2 PUFFS INTO LUNGS EVERY 6 HOURS AS NEEDED FOR WHEEZING/SHORTNESS OF BREATH, Disp: 18 g, Rfl: 2 .  ciprofloxacin (CIPRO) 250 MG tablet, Take 1 tablet (250 mg total) by mouth daily., Disp: 30 tablet, Rfl: 6 .  clobetasol ointment (TEMOVATE) [id number] %, Apply 1 application topically at bedtime. Apply to the skin of the vulva for 12 weeks, Disp: 30 g, Rfl: 3 .  felodipine (PLENDIL) 10 MG 24 hr tablet, TAKE 1 TABLET BY  MOUTH EVERY DAY, Disp: 90 tablet, Rfl: 1 .  fluticasone (CUTIVATE) 0.05 % cream, APPLY TO AFFECTED AREA OF R LOWER LEG TWICE PER DAY., Disp: 30 g, Rfl: 0 .  fluticasone (FLONASE SENSIMIST) 27.5 MCG/SPRAY nasal spray, Place 1 spray into the nose daily as needed for rhinitis., Disp: , Rfl:  .  furosemide (LASIX) 20 MG tablet, Take 1 tablet (20 mg total) by mouth daily., Disp: 90 tablet, Rfl: 1 .  HYDROcodone-acetaminophen (NORCO/VICODIN) 5-325 MG tablet, 1-2 tabs po bid prn pain, Disp: 90 tablet, Rfl: 0 .  insulin aspart (NOVOLOG) 100 UNIT/ML injection, USE PER SLIDING SCALE AT EACH MEAL (AVG USE IS 17 UNITS THREE TIMES DAILY), Disp: 50 mL, Rfl: 2 .  insulin NPH Human (NOVOLIN N) 100 UNIT/ML injection, 67 U SQ qAM, 67 U SQ mid day, and 67 U SQ q evening, Disp: 50 mL, Rfl: 5 .  Insulin Syringes, Disposable, U-100 0.3 ML MISC, Use to inject insulin--6 injections per day, Disp: 540 each, Rfl: 3 .  levocetirizine (XYZAL) 5 MG tablet, Take 5 mg by mouth at bedtime as needed for allergies. , Disp: , Rfl: 5 .  levothyroxine (SYNTHROID) 125 MCG tablet, TAKE 1 TABLET BY MOUTH EVERY MORNING ON EMPTY STOMACH, Disp: 30 tablet, Rfl: 0 .  meloxicam (MOBIC) 15 MG tablet, TAKE 1 TABLET BY MOUTH EVERY DAY AS NEEDED, Disp: 30 tablet, Rfl: 3 .  metFORMIN (GLUCOPHAGE) 500 MG tablet, Take 1 tablet by mouth every morning and 2  tablets by mouth at bedtime., Disp: 270 tablet, Rfl: 1 .  miconazole (MICRO GUARD) 2 % powder, Apply 1 application topically 2 (two) times daily as needed for itching., Disp: , Rfl:  .  montelukast (SINGULAIR) 10 MG tablet, Take 10 mg by mouth every evening. , Disp: , Rfl:  .  pravastatin (PRAVACHOL) 20 MG tablet, Take 1 tablet (20 mg total) by mouth daily., Disp: 90 tablet, Rfl: 0 .  Probiotic Product (PROBIOTIC PO), Take 1 capsule by mouth daily., Disp: , Rfl:  .  senna-docusate (SENOKOT-S) 8.6-50 MG tablet, Take 2 tablets by mouth at bedtime. Hold if having loose stools (Patient not taking: Reported on 09/28/2018), Disp: 30 tablet, Rfl: 1 .  SYMBICORT 160-4.5 MCG/ACT inhaler, TAKE 2 PUFFS BY MOUTH TWICE A DAY, Disp: 10.2 Inhaler, Rfl: 1  EXAM:  VITALS per patient if applicable: BP (!) [id number] (BP Location: Left Arm, Patient Position: Sitting, Cuff Size: Large)   Pulse 72   Temp 98 F (36.7 C) (Temporal)   Resp 16   Wt 258 lb (117 kg)   LMP 08/10/2010 Comment: Hysterectomy 02/02/18  BMI 48.75 kg/m    GENERAL: alert, oriented, appears well and in no acute distress  HEENT: atraumatic, conjunttiva clear, no obvious abnormalities on inspection of external nose and ears  NECK: normal movements of the head and neck  LUNGS: on inspection no signs of respiratory distress, breathing rate appears normal, no obvious gross SOB, gasping or wheezing  CV: no obvious cyanosis  MS: moves all visible extremities without noticeable abnormality  PSYCH/NEURO: pleasant and cooperative, no obvious depression or anxiety, speech and thought processing grossly intact  LABS: none today  Lab Results  Component Value Date   TSH 1.88 10/05/2018   Lab Results  Component Value Date   WBC 5.9 10/05/2018   HGB 9.2 (L) 10/05/2018   HCT 28.4 (L) 10/05/2018   MCV 105.5 (H) 10/05/2018   PLT 128.0 (L) 10/05/2018   Lab Results  Component Value Date  CREATININE 0.82 10/05/2018   BUN 21 10/05/2018   NA  140 10/05/2018   K 4.5 10/05/2018   CL 100 10/05/2018   CO2 29 10/05/2018   Lab Results  Component Value Date   ALT 15 10/05/2018   AST 20 10/05/2018   ALKPHOS 66 10/05/2018   BILITOT 0.4 10/05/2018   Lab Results  Component Value Date   CHOL 136 10/05/2018   Lab Results  Component Value Date   HDL 27.10 (L) 10/05/2018   Lab Results  Component Value Date   LDLCALC 75 02/13/2017   Lab Results  Component Value Date   TRIG 232.0 (H) 10/05/2018   Lab Results  Component Value Date   CHOLHDL 5 10/05/2018   Lab Results  Component Value Date   HGBA1C 7.5 (H) 10/05/2018    ASSESSMENT AND PLAN:  Discussed the following assessment and plan:  1) DM 2: fair control.  Some hypoglycemia the last 10d or so that is of unknown etiology. I recommended she continue 50 U N for her 1st 2 doses and decrease 3rd N dose to 60 U. Also, max of 15 U mealtime insulin at supper.  Continue 15-20 U mealtime insulin at BF and lunch. Try to check some 2H PP glucoses to better be able to make insulin dosing changes in future. She'll come in for lab visit for hbA1c and urine microalb/cr. If bp remains borderline and/or if having proteinuria, will cautiously restart enalapril (see PMH about hx of hyperkalemia).  2) HTN: borderline control, but need more home bp data before making any changes.  If increased home monitoring shows consistently above 140/90 then will cautiously restart enalapril (see #1 above). BMET at upcoming lab visit.  3) Mixed hyperlipidemia: tolerating low dose pravastatin. Last lipids 4 mo ago with good LDL, but still low HDL and mildly elevated trigs. No new meds at this time.  Try to increase activity and improve diet. Plan recheck FLP and hepatic panel in 6 mo.  4) Chronic pain syndrome: pretty stable, minimal use of vicodin, but does need rx done today. I sent in Vicodin5/325, 1-2 bid prn, #90. She needs to sign her CSC when she comes in for upcoming lab visit.  5) Chemo  induced pancytopenia: she stopped chemo 07/2018. Will monitor CBC with upcoming labs.  I discussed the assessment and treatment plan with the patient. The patient was provided an opportunity to ask questions and all were answered. The patient agreed with the plan and demonstrated an understanding of the instructions.   The patient was advised to call back or seek an in-person evaluation if the symptoms worsen or if the condition fails to improve as anticipated.  F/u: 3 mo [facility]/pain f/u  Signed:  Crissie Sickles, MD           01/16/2019

## 2019-01-21 ENCOUNTER — Other Ambulatory Visit: Payer: Self-pay

## 2019-01-21 ENCOUNTER — Ambulatory Visit (INDEPENDENT_AMBULATORY_CARE_PROVIDER_SITE_OTHER): Payer: Medicare Other | Admitting: Family Medicine

## 2019-01-21 ENCOUNTER — Telehealth: Payer: Self-pay | Admitting: Family Medicine

## 2019-01-21 DIAGNOSIS — I10 Essential (primary) hypertension: Secondary | ICD-10-CM

## 2019-01-21 DIAGNOSIS — E118 Type 2 diabetes mellitus with unspecified complications: Secondary | ICD-10-CM

## 2019-01-21 DIAGNOSIS — D61810 Antineoplastic chemotherapy induced pancytopenia: Secondary | ICD-10-CM | POA: Diagnosis not present

## 2019-01-21 LAB — GLUCOSE, POCT (MANUAL RESULT ENTRY): POC Glucose: 50 mg/dl — AB (ref 70–99)

## 2019-01-21 NOTE — Progress Notes (Signed)
Patient came into office today for lab work and began to become shaky and lightheaded.  She stated that her blood sugar felt low.  She began sweating so I offered her a protein shake to help get her glucose up.  Dr Anitra Lauth ordered Glucose which resulted at 79.  Patient waited in lab until she began to feel better.  Rechecked glucose after protein shake and mountain dew and got it up to 74. Patient still wasn't feeling completely better.  We gave her a cliff bar to try to get her feeling back to normal before letting patient leave.  Patient felt fine before leaving.  She stayed just over an hour prior to leaving the office.  I also walked her to her car to be sure she was okay to drive home. Patient stated she was feeling well enough to drive home.

## 2019-01-21 NOTE — Telephone Encounter (Signed)
Please contact patient about decreasing her insulin.

## 2019-01-21 NOTE — Telephone Encounter (Signed)
I couldn't find Lisa's note you sent me so I just decided to do a separate one. Pls recommend pt decrease her N insulin to 60 U 3 times per day, and limit her novolog at mealtime to 15 U.-thx

## 2019-01-22 LAB — CBC
HCT: 31.5 % — ABNORMAL LOW (ref 36.0–46.0)
Hemoglobin: 10.3 g/dL — ABNORMAL LOW (ref 12.0–15.0)
MCHC: 32.7 g/dL (ref 30.0–36.0)
MCV: 96.1 fl (ref 78.0–100.0)
Platelets: 223 10*3/uL (ref 150.0–400.0)
RBC: 3.28 Mil/uL — ABNORMAL LOW (ref 3.87–5.11)
RDW: 16.4 % — ABNORMAL HIGH (ref 11.5–15.5)
WBC: 10.1 10*3/uL (ref 4.0–10.5)

## 2019-01-22 LAB — BASIC METABOLIC PANEL
BUN: 24 mg/dL — ABNORMAL HIGH (ref 6–23)
CO2: 27 mEq/L (ref 19–32)
Calcium: 9.3 mg/dL (ref 8.4–10.5)
Chloride: 102 mEq/L (ref 96–112)
Creatinine, Ser: 0.79 mg/dL (ref 0.40–1.20)
GFR: 72.36 mL/min (ref >60.00)
Glucose, Bld: 48 mg/dL — CL (ref 70–99)
Potassium: 3.6 mEq/L (ref 3.5–5.1)
Sodium: 140 mEq/L (ref 135–145)

## 2019-01-22 LAB — HEMOGLOBIN A1C: Hgb A1c MFr Bld: 8.8 % — ABNORMAL HIGH (ref 4.6–6.5)

## 2019-01-22 NOTE — Telephone Encounter (Signed)
CRITICAL VALUE STICKER  CRITICAL VALUE: Glucose=48mg /dL  RECEIVER (on-site recipient of call): Roderic Ovens, RN  DATE & TIME NOTIFIED: 01/22/2019 3:35pm  MESSENGER (representative from lab): Esperanza Richters Leader  MD NOTIFIED: McGowen  TIME OF NOTIFICATION: 01/22/2019 3:45pm  Patient in office on 01/21/2019 for lab draw. POCT glucose was 50 mg/dL at time of visit. See phone notes for treatment/outcomes.

## 2019-01-22 NOTE — Telephone Encounter (Signed)
Separate telephone encounter that has PCP recommendations.

## 2019-01-22 NOTE — Telephone Encounter (Signed)
Patient advised and voiced understanding.  

## 2019-01-23 NOTE — Telephone Encounter (Signed)
Noted. We addressed this situation when she was in office for her blood draw 01/22/19.

## 2019-01-28 ENCOUNTER — Other Ambulatory Visit: Payer: Self-pay | Admitting: Family Medicine

## 2019-01-28 DIAGNOSIS — E118 Type 2 diabetes mellitus with unspecified complications: Secondary | ICD-10-CM

## 2019-02-10 ENCOUNTER — Other Ambulatory Visit: Payer: Self-pay | Admitting: Family Medicine

## 2019-02-20 ENCOUNTER — Other Ambulatory Visit: Payer: Self-pay | Admitting: Family Medicine

## 2019-02-21 ENCOUNTER — Ambulatory Visit: Payer: Self-pay | Admitting: Radiation Oncology

## 2019-02-22 ENCOUNTER — Other Ambulatory Visit (HOSPITAL_BASED_OUTPATIENT_CLINIC_OR_DEPARTMENT_OTHER): Payer: Self-pay | Admitting: Obstetrics & Gynecology

## 2019-02-22 ENCOUNTER — Other Ambulatory Visit (HOSPITAL_BASED_OUTPATIENT_CLINIC_OR_DEPARTMENT_OTHER): Payer: Self-pay | Admitting: Family Medicine

## 2019-02-22 DIAGNOSIS — Z1231 Encounter for screening mammogram for malignant neoplasm of breast: Secondary | ICD-10-CM

## 2019-02-25 ENCOUNTER — Other Ambulatory Visit: Payer: Self-pay

## 2019-02-25 ENCOUNTER — Ambulatory Visit
Admission: RE | Admit: 2019-02-25 | Discharge: 2019-02-25 | Disposition: A | Discharge status: Home / Self Care | Payer: Medicare Other | Source: Ambulatory Visit | Attending: Radiation Oncology | Admitting: Radiation Oncology

## 2019-02-25 ENCOUNTER — Encounter: Payer: Self-pay | Admitting: Radiation Oncology

## 2019-02-25 VITALS — BP 151/57 | HR 85 | Temp 98.3°F | Resp 18 | Ht 61.0 in

## 2019-02-25 DIAGNOSIS — Z8542 Personal history of malignant neoplasm of other parts of uterus: Secondary | ICD-10-CM | POA: Insufficient documentation

## 2019-02-25 DIAGNOSIS — Z794 Long term (current) use of insulin: Secondary | ICD-10-CM | POA: Insufficient documentation

## 2019-02-25 DIAGNOSIS — K5900 Constipation, unspecified: Secondary | ICD-10-CM | POA: Diagnosis not present

## 2019-02-25 DIAGNOSIS — Z923 Personal history of irradiation: Secondary | ICD-10-CM | POA: Diagnosis not present

## 2019-02-25 DIAGNOSIS — Z79899 Other long term (current) drug therapy: Secondary | ICD-10-CM | POA: Diagnosis not present

## 2019-02-25 DIAGNOSIS — C541 Malignant neoplasm of endometrium: Secondary | ICD-10-CM

## 2019-02-25 DIAGNOSIS — Z08 Encounter for follow-up examination after completed treatment for malignant neoplasm: Secondary | ICD-10-CM | POA: Diagnosis not present

## 2019-02-25 NOTE — Progress Notes (Signed)
Radiation Oncology         [phone] ________________________________  Name: [name] MRN: [id number]  Date: 02/25/2019  DOB: [birth year]  Follow-Up Visit Note  CC: McGowen, Adrian Blackwater, MD  Everitt Amber, MD    ICD-10-CM   1. Endometrial cancer ([place])  C54.1     Diagnosis:   Stage IIIA endometroid endometrial carcinoma      Interval Since Last Radiation: Nine months  Radiation treatment dates:   04/24/18, 05/03/18, 05/07/18, 05/16/18, 05/21/18  Site/dose:  Vaginal cuff, 6 Gy in 5 fractions for a total dose of 30 Gy  Narrative:  The patient returns today for routine follow-up. She is doing well overall  Since last follow-up, she had a routine follow-up with Dr. Denman George on 11/16/2018.       On review of systems, she reports no vaginal bleeding or pelvic pain.  She uses her vaginal dilator once a week.  She denies any diarrhea or rectal bleeding except when constipated.  She denies any abdominal  bloating.                 ALLERGIES:  is allergic to adhesive [tape]; banana; eggs or egg-derived products; latex; penicillins; pine; and rose.  Meds: Current Outpatient Medications  Medication Sig Dispense Refill  . albuterol (VENTOLIN HFA) 108 (90 Base) MCG/ACT inhaler INHALE 2 PUFFS INTO LUNGS EVERY 6 HOURS AS NEEDED FOR WHEEZING/SHORTNESS OF BREATH 18 g 2  . ciprofloxacin (CIPRO) 250 MG tablet Take 1 tablet (250 mg total) by mouth daily. 30 tablet 6  . clobetasol ointment (TEMOVATE) [id number] % Apply 1 application topically at bedtime. Apply to the skin of the vulva for 12 weeks 30 g 3  . felodipine (PLENDIL) 10 MG 24 hr tablet TAKE 1 TABLET BY MOUTH EVERY DAY 90 tablet 1  . fluticasone (CUTIVATE) 0.05 % cream APPLY TO AFFECTED AREA OF R LOWER LEG TWICE PER DAY. 30 g 0  . fluticasone (FLONASE SENSIMIST) 27.5 MCG/SPRAY nasal spray Place 1 spray into the nose daily as needed for rhinitis.    . furosemide (LASIX) 20 MG tablet TAKE 1 TABLET BY MOUTH EVERY DAY 90 tablet 1  .  HYDROcodone-acetaminophen (NORCO/VICODIN) 5-325 MG tablet 1-2 tabs po bid prn pain 90 tablet 0  . insulin aspart (NOVOLOG) 100 UNIT/ML injection USE PER SLIDING SCALE AT EACH MEAL (AVERAGE USE IS 17 UNITS 3 TIMES DAILY) 50 mL 2  . insulin NPH Human (NOVOLIN N) 100 UNIT/ML injection 62 U SQ qAM, 62 U SQ mid day, and 60 U SQ q evening 50 mL 5  . Insulin Syringes, Disposable, U-100 0.3 ML MISC Use to inject insulin--6 injections per day 540 each 3  . levocetirizine (XYZAL) 5 MG tablet Take 5 mg by mouth at bedtime as needed for allergies.   5  . levothyroxine (SYNTHROID) 125 MCG tablet 1 tab po qd 90 tablet 3  . meloxicam (MOBIC) 15 MG tablet TAKE 1 TABLET BY MOUTH EVERY DAY AS NEEDED 30 tablet 3  . miconazole (MICRO GUARD) 2 % powder Apply 1 application topically 2 (two) times daily as needed for itching.    . pravastatin (PRAVACHOL) 20 MG tablet Take 1 tablet (20 mg total) by mouth daily. 90 tablet 0  . Probiotic Product (PROBIOTIC PO) Take 1 capsule by mouth daily.    . SYMBICORT 160-4.5 MCG/ACT inhaler TAKE 2 PUFFS BY MOUTH TWICE A DAY 10.2 Inhaler 1  . albuterol (PROVENTIL) (2.5 MG/3ML) 0.083% nebulizer solution Take 3 mLs (2.5  mg total) by nebulization every 4 (four) hours as needed for wheezing or shortness of breath (Dx: J45.50). (Patient not taking: Reported on 11/20/2018) 75 mL 1  . metFORMIN (GLUCOPHAGE) 500 MG tablet Take 1 tablet by mouth every morning and 2 tablets by mouth at bedtime. (Patient not taking: Reported on 02/25/2019) 270 tablet 1  . montelukast (SINGULAIR) 10 MG tablet Take 1 tablet (10 mg total) by mouth every evening. (Patient not taking: Reported on 02/25/2019) 90 tablet 3  . senna-docusate (SENOKOT-S) 8.6-50 MG tablet Take 2 tablets by mouth at bedtime. Hold if having loose stools (Patient not taking: Reported on 01/16/2019) 30 tablet 1   No current facility-administered medications for this encounter.     Physical Findings: The patient is in no acute distress. Patient  is alert and oriented.  height is 5\' 1"  (1.549 m). Her temporal temperature is 98.3 F (36.8 C). Her blood pressure is 151/57 (abnormal) and her pulse is 85. Her respiration is 18 and oxygen saturation is 98%. .  No significant changes. Lungs are clear to auscultation bilaterally. Heart has regular rate and rhythm. No palpable cervical, supraclavicular, or axillary adenopathy. Abdomen soft, non-tender, normal bowel sounds. On pelvic examination the external genitalia were unremarkable.  No signs of yeast infection in the labia or vaginal area. A speculum exam was performed. There are no mucosal lesions noted in the vaginal vault. . On bimanual  examination there were no pelvic masses appreciated.   Body mass index is 48.75 kg/m.  Lab Findings: Lab Results  Component Value Date   WBC 10.1 01/21/2019   HGB 10.3 (L) 01/21/2019   HCT 31.5 (L) 01/21/2019   MCV 96.1 01/21/2019   PLT 223.0 01/21/2019    Radiographic Findings: No results found.  Impression: No evidence of recurrence on clinical exam today.   Plan: She will follow up here in six months. She is due to follow up with Dr. Denman George for her three month visit.   ____________________________________   Blair Promise, PhD, MD   This document serves as a record of services personally performed by Gery Pray, MD. It was created on his behalf by Clerance Lav, a trained medical scribe. The creation of this record is based on the scribe's personal observations and the provider's statements to them. This document has been checked and approved by the attending provider.

## 2019-02-25 NOTE — Progress Notes (Addendum)
[name] is here for a follow-up appointment today Patient denies any vaginal pain. Patient reports mild fatigue. Patient denies any vaginal bleeding or discharge. Patient states that she has rectal bleeding sometime when she is constipated. Patient denies any nausea and vomiting. Patient denies any skin irratation . Patient denies any issues with her  bowels. Vitals:   02/25/19 1521  BP: (!) 151/57  Pulse: 85  Resp: 18  Temp: 98.3 F (36.8 C)  TempSrc: Temporal  SpO2: 98%  Height: 5\' 1"  (1.549 m)

## 2019-02-25 NOTE — Progress Notes (Deleted)
[name] is here for a follow-up appointment today Patient denies any vaginal pain. Patient reports mild fatigue. Patient denies any vaginal bleeding or discharge. Patient states that she has rectal bleeding sometime when she is constipated. Patient denies any nausea and vomiting. Patient denies any skin irration . Patient denies any issues with her  Bowels. Vitals:   02/25/19 1521  BP: (!) 151/57  Pulse: (!) 5  Resp: 18  Temp: 98.3 F (36.8 C)  TempSrc: Temporal  SpO2: 98%  Height: 5\' 1"  (1.549 m)

## 2019-02-26 ENCOUNTER — Telehealth: Payer: Self-pay | Admitting: *Deleted

## 2019-02-26 NOTE — Telephone Encounter (Signed)
CALLED PATIENT TO INFORM OF FU WITH DR. KINARD ON 08-26-19 @ 3:30 PM, ALSO I WILL SCHEDULE FU WITH DR. Denman George ON 05-2019 WHEN THEIR BOOKS GET OPENED, LVM FOR A RETURN CALL

## 2019-02-27 ENCOUNTER — Other Ambulatory Visit: Payer: Self-pay | Admitting: Family Medicine

## 2019-02-27 ENCOUNTER — Other Ambulatory Visit: Payer: Self-pay | Admitting: Gynecologic Oncology

## 2019-02-27 DIAGNOSIS — N904 Leukoplakia of vulva: Secondary | ICD-10-CM

## 2019-03-05 ENCOUNTER — Other Ambulatory Visit: Payer: Self-pay

## 2019-03-05 ENCOUNTER — Encounter (HOSPITAL_BASED_OUTPATIENT_CLINIC_OR_DEPARTMENT_OTHER): Payer: Self-pay

## 2019-03-05 ENCOUNTER — Ambulatory Visit (HOSPITAL_BASED_OUTPATIENT_CLINIC_OR_DEPARTMENT_OTHER)
Admission: RE | Admit: 2019-03-05 | Discharge: 2019-03-05 | Disposition: A | Discharge status: Home / Self Care | Payer: Medicare Other | Source: Ambulatory Visit | Attending: Obstetrics & Gynecology | Admitting: Obstetrics & Gynecology

## 2019-03-05 DIAGNOSIS — Z1231 Encounter for screening mammogram for malignant neoplasm of breast: Secondary | ICD-10-CM | POA: Diagnosis not present

## 2019-03-11 ENCOUNTER — Telehealth: Payer: Self-pay | Admitting: Family Medicine

## 2019-03-11 MED ORDER — HYDROCODONE-ACETAMINOPHEN 5-325 MG PO TABS: ORAL_TABLET | ORAL | 0 refills | Status: DC

## 2019-03-11 NOTE — Telephone Encounter (Signed)
OK, hydrocodone eRx'd 

## 2019-03-11 NOTE — Telephone Encounter (Signed)
RF request for hydrocodone.  Last OV 01/16/2019 Next OV 04/22/2019 Last RX 01/16/2019 # 90 x 0 Rf.  Please advise.

## 2019-03-11 NOTE — Telephone Encounter (Signed)
Patient request for refill  HYDROcodone-acetaminophen (NORCO/VICODIN) 5-325 MG tablet [id number]  [id number] - [place], [place]

## 2019-03-15 ENCOUNTER — Ambulatory Visit: Payer: Medicare Other | Admitting: Internal Medicine

## 2019-03-27 ENCOUNTER — Other Ambulatory Visit: Payer: Self-pay | Admitting: Family Medicine

## 2019-04-11 ENCOUNTER — Other Ambulatory Visit: Payer: Self-pay | Admitting: Family Medicine

## 2019-04-16 ENCOUNTER — Telehealth: Payer: Self-pay | Admitting: *Deleted

## 2019-04-16 ENCOUNTER — Other Ambulatory Visit: Payer: Self-pay

## 2019-04-16 MED ORDER — FLUTICASONE PROPIONATE 0.05 % EX CREA: TOPICAL_CREAM | CUTANEOUS | 0 refills | Status: DC

## 2019-04-16 NOTE — Telephone Encounter (Signed)
CALLED PATIENT TO INFORM OF FU WITH DR. Denman George ON 05-20-19 - ARRIVAL TIME- 1:30 PM, SPOKE WITH PATIENT AND SHE IS AWARE OF THIS APPT.

## 2019-04-18 ENCOUNTER — Ambulatory Visit: Payer: Medicare Other | Admitting: Internal Medicine

## 2019-04-21 ENCOUNTER — Other Ambulatory Visit: Payer: Self-pay | Admitting: Family Medicine

## 2019-04-22 ENCOUNTER — Ambulatory Visit (INDEPENDENT_AMBULATORY_CARE_PROVIDER_SITE_OTHER): Payer: Medicare Other | Admitting: Family Medicine

## 2019-04-22 ENCOUNTER — Encounter: Payer: Self-pay | Admitting: Family Medicine

## 2019-04-22 ENCOUNTER — Other Ambulatory Visit: Payer: Self-pay

## 2019-04-22 VITALS — BP 136/56 | HR 66

## 2019-04-22 DIAGNOSIS — G620 Drug-induced polyneuropathy: Secondary | ICD-10-CM | POA: Diagnosis not present

## 2019-04-22 DIAGNOSIS — M8949 Other hypertrophic osteoarthropathy, multiple sites: Secondary | ICD-10-CM

## 2019-04-22 DIAGNOSIS — Z0000 Encounter for general adult medical examination without abnormal findings: Secondary | ICD-10-CM

## 2019-04-22 DIAGNOSIS — G894 Chronic pain syndrome: Secondary | ICD-10-CM | POA: Diagnosis not present

## 2019-04-22 DIAGNOSIS — T451X5A Adverse effect of antineoplastic and immunosuppressive drugs, initial encounter: Secondary | ICD-10-CM

## 2019-04-22 DIAGNOSIS — M159 Polyosteoarthritis, unspecified: Secondary | ICD-10-CM

## 2019-04-22 DIAGNOSIS — I10 Essential (primary) hypertension: Secondary | ICD-10-CM

## 2019-04-22 DIAGNOSIS — E1149 Type 2 diabetes mellitus with other diabetic neurological complication: Secondary | ICD-10-CM | POA: Diagnosis not present

## 2019-04-22 MED ORDER — HYDROCODONE-ACETAMINOPHEN 5-325 MG PO TABS: ORAL_TABLET | ORAL | 0 refills | Status: DC

## 2019-04-22 NOTE — Progress Notes (Signed)
Virtual Visit via Video Note  I connected with pt on 04/22/19 at  2:00 PM EST by a video enabled telemedicine application and verified that I am speaking with the correct person using two identifiers.  Location patient: home Location provider:work or home office Persons participating in the virtual visit: patient, provider  I discussed the limitations of evaluation and management by telemedicine and the availability of in person appointments. The patient expressed understanding and agreed to proceed.  Telemedicine visit is a necessity given the COVID-19 restrictions in place at the current time.  HPI: 69 y/o WF being seen today for 3 mo f/u chronic pain syndrome, HTN, DM 2. She has mod/sev pers asthma as well. Last visit I referred her to [facility] endocrinology for further mgmt of her DM.  She has not seen them yet->has appt end of this month.  Indication for chronic opioid:osteoarthritis low back, feet, L hip, knees, +chemotherapy -induced neuropathy (burning and numbness in feet). Medication and dose:Vicodin 5/325, 1-2 bid prn # pills per month: 90 NCCSRS reviewed this encounter (include red flags):yes, no red flags.  PMP AWARE reviewed today: most recent rx for vicodin 5/325 was filled 03/11/19, # 36, rx by me. No red flags.  -->Uses vicodin almost exclusively at night-time b/c this is when her pain is the worst and she can't sleep--takes 1 tab most days, rarely 2. Some periods of "flaring of pain" mainly assoc with weather changes and higher activity levels-->leads to increased pain.   ROS: See pertinent positives and negatives per HPI.  Interim hx:  Pain: hip, right foot, right hand/fingers are the worst areas of pain/arthritis.  Back and left foot not as bad. Her pain is pretty persistent/moderate.  Still taking nightly vicodin but now 2 tabs at that time in daytime avg just one vicodin tab (total of 3 per day avg). Using some advil/aleve in daytime.  Still having a couple  low gluc's per week --happens when she overestimates her insulin need based on carbs in meal. Only highs (250) are after she has overeaten from having a low gluc.  Typical insulin dosing 52-54 N tid, and usually 10-12 U novolog with meals.  HTN: most bp's 130/80 or better.   ROS: no fevers, no CP, no SOB, no wheezing, no cough, no dizziness, no HAs, no rashes, no melena/hematochezia.  No polyuria or polydipsia.  No myalgias or arthralgias.   Past Medical History:  Diagnosis Date  . Allergic rhinitis    Allergy testing: results pending as of 05/28/16 (Dr. Harold Hedge)  . Anemia   . Arthritis   . Chemotherapy-induced neuropathy ([place]) 2019  . Cough variant asthma   . DDD (degenerative disc disease), lumbar   . Diabetes mellitus with complication ([place])    Mild nonprolif DR R eye 12/2017-->referred to retinal specialist  . Endometrial cancer ([place]) 02/2018   REMISSION AS OF 08/2018.  Stage III (T3, Nx, Mx)  Path: endometroid adenocarcinoma involving cervix, uterus, and fallopian tubes (Pt got TAH & BSO 02/2018).  . GERD (gastroesophageal reflux disease)   . Hemorrhoids   . Herpes zoster 10/2015   L side belt-line  . History of adenomatous polyp of colon 02/25/13   5 mm cecal polyp removed by Dr. Trevor Mace 5 yrs  . History of blood transfusion   . History of cellulitis 08/2010   Left (Since replacemnt of Left knee  . Hyperkalemia 03/2018   started after pt started getting chemo-->had to stop enalapril.  Marland Kitchen Hyperlipidemia    Not  on statin b/c lipids "stayed down when sugars came down" per pt.  She says Dr. Chalmers Cater knows she is not on statin anymore.  . Hypertension   . Hypothyroidism   . Nephrolithiasis 4/07  . Obesity   . OSA on CPAP   . Osteoarthritis    knees, ankle, + ? scapholunate ligament disruption (x-ray 06/2017)--ortho referral.  . Pancytopenia due to antineoplastic chemotherapy ([place])    progressive as of 06/2018. Stable 08/2018.  Marland Kitchen Recurrent UTI    started cipro 250 qd  09/2018  . Sciatica of left side   . Severe persistent asthma    cough-variant--saw Allergist 05/25/16 and was switched from max dose advair to symbicort.  . Systolic murmur    ECHO fine 10/2016-->murmur likely flow murmur assoc with HTN.  Marland Kitchen Zoon's vulvitis    Bx-proven (GYN) -lichen sclerosis.  Clobetasol 0.05% ointment per GYN    Past Surgical History:  Procedure Laterality Date  . ANKLE FRACTURE SURGERY  1984   Pin & repair  . ARTHROSCOPIC REPAIR ACL  2000   Due to ACL tear  . ARTHROSCOPIC REPAIR ACL  5/08  . Blateral knee rerlacements x4 2 times each knee    . BREAST BIOPSY Right 2014   Benign  . CARDIAC CATHETERIZATION  5/07   clear vessel mild mitral   . CARPAL TUNNEL RELEASE Right [id number]   1989 Left  . CESAREAN SECTION  1985  . CHOLECYSTECTOMY OPEN  1988  . COLONOSCOPY N/A 02/25/2013   Tubular adenoma x 1: Recall 5 yrs. Procedure: COLONOSCOPY;  Surgeon: Juanita Craver, MD;  Location: [facility];  Service: Endoscopy;  Laterality: N/A;  . COMBINED HYSTEROSCOPY DIAGNOSTIC / D&C  2/12   Bx neg  . DEXA  08/2007   Bone density normal.  . DILATION AND CURETTAGE OF UTERUS  12/11  . IR IMAGING GUIDED PORT INSERTION  03/16/2018  . IR REMOVAL TUN ACCESS W/ PORT W/O FL MOD SED  09/20/2018  . LYMPH NODE BIOPSY N/A 02/19/2018   Procedure: Sentinel LYMPH NODE BIOPSY;  Surgeon: Everitt Amber, MD;  [facility];  Service: Gynecology;  Laterality: N/A;  . REPLACEMENT TOTAL KNEE Left 5/12   X 2 on each  . ROBOTIC ASSISTED TOTAL HYSTERECTOMY WITH BILATERAL SALPINGO OOPHERECTOMY N/A 02/19/2018   For endometrial cancer.  Procedure: XI ROBOTIC ASSISTED TOTAL HYSTERECTOMY WITH BILATERAL SALPINGO OOPHORECTOMY;  Surgeon: Everitt Amber, MD;  Location: [place];  Service: Gynecology;  Laterality: N/A;  . TRANSTHORACIC ECHOCARDIOGRAM  10/11/2016    EF 55-60%, grd I DD.  . TUBAL LIGATION  1985   C-Section    Family History  Problem Relation Age of Onset  .  Diabetes Father   . COPD Father   . Stroke Father   . Celiac disease Brother   . Rheum arthritis Brother   . Thyroid disease Brother   . Diabetes Brother   . Alzheimer's disease Mother   . Arthritis Mother   . Other Son        Died age 50 -Tetrology of Fallot - VSD/pulmonary atresia  . Breast cancer Other        postmenopausal when diagnosed     Current Outpatient Medications:  .  albuterol (PROVENTIL) (2.5 MG/3ML) 0.083% nebulizer solution, Take 3 mLs (2.5 mg total) by nebulization every 4 (four) hours as needed for wheezing or shortness of breath (Dx: J45.50)., Disp: 75 mL, Rfl: 1 .  albuterol (VENTOLIN HFA) 108 (90 Base) MCG/ACT inhaler, INHALE  2 PUFFS INTO LUNGS EVERY 6 HOURS AS NEEDED FOR WHEEZING/SHORTNESS OF BREATH, Disp: 18 g, Rfl: 2 .  ciprofloxacin (CIPRO) 250 MG tablet, Take 1 tablet (250 mg total) by mouth daily., Disp: 30 tablet, Rfl: 6 .  clobetasol ointment (TEMOVATE) [id number] %, APPLY 1 APPLICATION TOPICALLY AT BEDTIME. APPLY TO THE SKIN OF THE VULVA FOR 12 WEEKS, Disp: 30 g, Rfl: 1 .  felodipine (PLENDIL) 10 MG 24 hr tablet, TAKE 1 TABLET BY MOUTH EVERY DAY, Disp: 90 tablet, Rfl: 1 .  fluticasone (CUTIVATE) 0.05 % cream, APPLY TO AFFECTED AREA OF R LOWER LEG TWICE PER DAY., Disp: 30 g, Rfl: 0 .  fluticasone (FLONASE SENSIMIST) 27.5 MCG/SPRAY nasal spray, Place 1 spray into the nose daily as needed for rhinitis., Disp: , Rfl:  .  furosemide (LASIX) 20 MG tablet, TAKE 1 TABLET BY MOUTH EVERY DAY, Disp: 90 tablet, Rfl: 1 .  HYDROcodone-acetaminophen (NORCO/VICODIN) 5-325 MG tablet, 1-2 tabs po bid prn pain, Disp: 90 tablet, Rfl: 0 .  insulin aspart (NOVOLOG) 100 UNIT/ML injection, USE PER SLIDING SCALE AT EACH MEAL (AVERAGE USE IS 17 UNITS 3 TIMES DAILY), Disp: 50 mL, Rfl: 2 .  insulin NPH Human (NOVOLIN N) 100 UNIT/ML injection, 62 U SQ qAM, 62 U SQ mid day, and 60 U SQ q evening, Disp: 50 mL, Rfl: 5 .  Insulin Syringes, Disposable, U-100 0.3 ML MISC, Use to inject insulin--6  injections per day, Disp: 540 each, Rfl: 3 .  levocetirizine (XYZAL) 5 MG tablet, Take 5 mg by mouth at bedtime as needed for allergies. , Disp: , Rfl: 5 .  levothyroxine (SYNTHROID) 125 MCG tablet, 1 tab po qd, Disp: 90 tablet, Rfl: 3 .  meloxicam (MOBIC) 15 MG tablet, TAKE 1 TABLET BY MOUTH EVERY DAY AS NEEDED, Disp: 30 tablet, Rfl: 3 .  metFORMIN (GLUCOPHAGE) 500 MG tablet, TAKE 1 TABLET BY MOUTH EVERY MORNING AND 2 TABLETS BY MOUTH AT BEDTIME., Disp: 270 tablet, Rfl: 1 .  miconazole (MICRO GUARD) 2 % powder, Apply 1 application topically 2 (two) times daily as needed for itching., Disp: , Rfl:  .  montelukast (SINGULAIR) 10 MG tablet, Take 1 tablet (10 mg total) by mouth every evening., Disp: 90 tablet, Rfl: 3 .  pravastatin (PRAVACHOL) 20 MG tablet, Take 1 tablet (20 mg total) by mouth daily., Disp: 90 tablet, Rfl: 0 .  Probiotic Product (PROBIOTIC PO), Take 1 capsule by mouth daily., Disp: , Rfl:  .  SYMBICORT 160-4.5 MCG/ACT inhaler, TAKE 2 PUFFS BY MOUTH TWICE A DAY, Disp: 10.2 Inhaler, Rfl: 0  EXAM:  VITALS per patient if applicable: BP (!) [id number] (BP Location: Left Arm, Patient Position: Sitting, Cuff Size: Large)   Pulse 66   LMP 08/10/2010 Comment: Hysterectomy 02/02/18   GENERAL: alert, oriented, appears well and in no acute distress  HEENT: atraumatic, conjunttiva clear, no obvious abnormalities on inspection of external nose and ears  NECK: normal movements of the head and neck  LUNGS: on inspection no signs of respiratory distress, breathing rate appears normal, no obvious gross SOB, gasping or wheezing  CV: no obvious cyanosis  MS: moves all visible extremities without noticeable abnormality  PSYCH/NEURO: pleasant and cooperative, no obvious depression or anxiety, speech and thought processing grossly intact  LABS: none today  Lab Results  Component Value Date   TSH 1.88 10/05/2018   Lab Results  Component Value Date   WBC 10.1 01/21/2019   HGB 10.3 (L)  01/21/2019   HCT 31.5 (L) 01/21/2019  MCV 96.1 01/21/2019   PLT 223.0 01/21/2019  No results found for: IRON, TIBC, FERRITIN No results found for: VITAMINB12  Lab Results  Component Value Date   CREATININE 0.79 01/21/2019   BUN 24 (H) 01/21/2019   NA 140 01/21/2019   K 3.6 01/21/2019   CL 102 01/21/2019   CO2 27 01/21/2019   Lab Results  Component Value Date   ALT 15 10/05/2018   AST 20 10/05/2018   ALKPHOS 66 10/05/2018   BILITOT 0.4 10/05/2018   Lab Results  Component Value Date   CHOL 136 10/05/2018   Lab Results  Component Value Date   HDL 27.10 (L) 10/05/2018   Lab Results  Component Value Date   LDLCALC 75 02/13/2017   Lab Results  Component Value Date   TRIG 232.0 (H) 10/05/2018   Lab Results  Component Value Date   CHOLHDL 5 10/05/2018   Lab Results  Component Value Date   HGBA1C 8.8 (H) 01/21/2019    ASSESSMENT AND PLAN:  Discussed the following assessment and plan:  1) Chronic pain syndrome: osteoarthritis of multiple sites, +chemotherapy-induced PN. Stable.  The current medical regimen is effective;  continue present plan and medications. I did electronic rx's for vicodin 5/325, 1-2 bid prn, #90  today for this month, Feb 2021, and Mar 2021.  Appropriate fill on/after date was noted on each rx. [facility] UTD.  2) DM 2, control historically up and down. She has initial appt with endocrinologist at the end of this month. No labs today.  No insulin dosing changes today.  3) HTN: The current medical regimen is effective;  continue present plan and medications.  4) Colon ca screening: adenoma x 1 on initial screening 2014.  Needs repeat when pt feels like she feels well enough (and safe enough with regard to covid 19 pandemic) --Dr. Collene Mares.    I discussed the assessment and treatment plan with the patient. The patient was provided an opportunity to ask questions and all were answered. The patient agreed with the plan and demonstrated an understanding  of the instructions.   The patient was advised to call back or seek an in-person evaluation if the symptoms worsen or if the condition fails to improve as anticipated.  F/u: 3 mo telemed f/u chronic pain  Signed:  Crissie Sickles, MD           04/22/2019

## 2019-05-03 ENCOUNTER — Other Ambulatory Visit: Payer: Self-pay

## 2019-05-07 ENCOUNTER — Ambulatory Visit (INDEPENDENT_AMBULATORY_CARE_PROVIDER_SITE_OTHER): Payer: Medicare Other | Admitting: Internal Medicine

## 2019-05-07 ENCOUNTER — Other Ambulatory Visit: Payer: Self-pay

## 2019-05-07 ENCOUNTER — Encounter: Payer: Self-pay | Admitting: Internal Medicine

## 2019-05-07 VITALS — BP 128/58 | HR 92 | Ht 61.0 in | Wt 274.0 lb

## 2019-05-07 DIAGNOSIS — E1165 Type 2 diabetes mellitus with hyperglycemia: Secondary | ICD-10-CM | POA: Diagnosis not present

## 2019-05-07 DIAGNOSIS — E11319 Type 2 diabetes mellitus with unspecified diabetic retinopathy without macular edema: Secondary | ICD-10-CM | POA: Diagnosis not present

## 2019-05-07 DIAGNOSIS — E119 Type 2 diabetes mellitus without complications: Secondary | ICD-10-CM | POA: Insufficient documentation

## 2019-05-07 DIAGNOSIS — IMO0002 Reserved for concepts with insufficient information to code with codable children: Secondary | ICD-10-CM

## 2019-05-07 LAB — POCT GLYCOSYLATED HEMOGLOBIN (HGB A1C): Hemoglobin A1C: 6.6 % — AB (ref 4.0–5.6)

## 2019-05-07 MED ORDER — INSULIN NPH (HUMAN) (ISOPHANE) 100 UNIT/ML ~~LOC~~ SUSP: SUBCUTANEOUS | 5 refills | Status: DC

## 2019-05-07 MED ORDER — INSULIN ASPART 100 UNIT/ML ~~LOC~~ SOLN: SUBCUTANEOUS | 2 refills | Status: DC

## 2019-05-07 MED ORDER — OZEMPIC (0.25 OR 0.5 MG/DOSE) 2 MG/1.5ML ~~LOC~~ SOPN: 0.5000 mg | PEN_INJECTOR | SUBCUTANEOUS | 1 refills | Status: DC

## 2019-05-07 NOTE — Patient Instructions (Addendum)
Please continue: - Metformin 500 mg in am and 1000 mg with dinner  Please decrease: - NPH 30 units 3x a day - Novolog 10-14 units 3x a day, before meals  Please start: - Ozempic 0.25 mg weekly in a.m. (for example on Sunday morning) x 4 weeks, then increase to 0.5 mg weekly in a.m. if no nausea or hypoglycemia.  Please let me know if the sugars are consistently <80 or >200.  Please come back for a follow-up appointment in 3 months.  PATIENT INSTRUCTIONS FOR TYPE 2 DIABETES:  DIET AND EXERCISE Diet and exercise is an important part of diabetic treatment.  We recommended aerobic exercise in the form of brisk walking (working between 40-60% of maximal aerobic capacity, similar to brisk walking) for 150 minutes per week (such as 30 minutes five days per week) along with 3 times per week performing 'resistance' training (using various gauge rubber tubes with handles) 5-10 exercises involving the major muscle groups (upper body, lower body and core) performing 10-15 repetitions (or near fatigue) each exercise. Start at half the above goal but build slowly to reach the above goals. If limited by weight, joint pain, or disability, we recommend daily walking in a swimming pool with water up to waist to reduce pressure from joints while allow for adequate exercise.    BLOOD GLUCOSES Monitoring your blood glucoses is important for continued management of your diabetes. Please check your blood glucoses 2-4 times a day: fasting, before meals and at bedtime (you can rotate these measurements - e.g. one day check before the 3 meals, the next day check before 2 of the meals and before bedtime, etc.).   HYPOGLYCEMIA (low blood sugar) Hypoglycemia is usually a reaction to not eating, exercising, or taking too much insulin/ other diabetes drugs.  Symptoms include tremors, sweating, hunger, confusion, headache, etc. Treat IMMEDIATELY with 15 grams of Carbs: . 4 glucose tablets .  cup regular juice/soda . 2  tablespoons raisins . 4 teaspoons sugar . 1 tablespoon honey Recheck blood glucose in 15 mins and repeat above if still symptomatic/blood glucose <100.  RECOMMENDATIONS TO REDUCE YOUR RISK OF DIABETIC COMPLICATIONS: * Take your prescribed MEDICATION(S) * Follow a DIABETIC diet: Complex carbs, fiber rich foods, (monounsaturated and polyunsaturated) fats * AVOID saturated/trans fats, high fat foods, >2,300 mg salt per day. * EXERCISE at least 5 times a week for 30 minutes or preferably daily.  * DO NOT SMOKE OR DRINK more than 1 drink a day. * Check your FEET every day. Do not wear tightfitting shoes. Contact us if you develop an ulcer * See your EYE doctor once a year or more if needed * Get a FLU shot once a year * Get a PNEUMONIA vaccine once before and once after age 61 years  GOALS:  * Your Hemoglobin A1c of <7%  * fasting sugars need to be <130 * after meals sugars need to be <180 (2h after you start eating) * Your Systolic BP should be [id number] or lower  * Your Diastolic BP should be 80 or lower  * Your HDL (Good Cholesterol) should be 40 or higher  * Your LDL (Bad Cholesterol) should be 100 or lower. * Your Triglycerides should be 150 or lower  * Your Urine microalbumin (kidney function) should be <30 * Your Body Mass Index should be 25 or lower    Please consider the following ways to cut down carbs and fat and increase fiber and micronutrients in your diet: - substitute  whole grain for white bread or pasta - substitute brown rice for white rice - substitute 90-calorie flat bread pieces for slices of bread when possible - substitute sweet potatoes or yams for white potatoes - substitute humus for margarine - substitute tofu for cheese when possible - substitute almond or rice milk for regular milk (would not drink soy milk daily due to concern for soy estrogen influence on breast cancer risk) - substitute dark chocolate for other sweets when possible - substitute water - can  add lemon or orange slices for taste - for diet sodas (artificial sweeteners will trick your body that you can eat sweets without getting calories and will lead you to overeating and weight gain in the long run) - do not skip breakfast or other meals (this will slow down the metabolism and will result in more weight gain over time)  - can try smoothies made from fruit and almond/rice milk in am instead of regular breakfast - can also try old-fashioned (not instant) oatmeal made with almond/rice milk in am - order the dressing on the side when eating salad at a restaurant (pour less than half of the dressing on the salad) - eat as little meat as possible - can try juicing, but should not forget that juicing will get rid of the fiber, so would alternate with eating raw veg./fruits or drinking smoothies - use as little oil as possible, even when using olive oil - can dress a salad with a mix of balsamic vinegar and lemon juice, for e.g. - use agave nectar, stevia sugar, or regular sugar rather than artificial sweateners - steam or broil/roast veggies  - snack on veggies/fruit/nuts (unsalted, preferably) when possible, rather than processed foods - reduce or eliminate aspartame in diet (it is in diet sodas, chewing gum, etc) Read the labels!  Try to read Dr. Janene Harvey book: "Program for Reversing Diabetes" for other ideas for healthy eating.

## 2019-05-07 NOTE — Progress Notes (Signed)
Patient ID: [name], female   DOB: 07/10/1950, 69 y.o.   MRN: [id number]   This visit occurred during the SARS-CoV-2 public health emergency.  Safety protocols were in place, including screening questions prior to the visit, additional usage of staff PPE, and extensive cleaning of exam room while observing appropriate contact time as indicated for disinfecting solutions.   HPI: [name] is a 69 y.o.-year-old female, referred by her PCP, Dr. Ernestine Conrad, for management of DM2, dx in 1992, insulin-dependent since 2004-2005, uncontrolled, with complications (DR). On [facility].  Reviewed latest HbA1c level: Lab Results  Component Value Date   HGBA1C 8.8 (H) 01/21/2019   HGBA1C 7.5 (H) 10/05/2018   HGBA1C 7.0 (A) 05/18/2018   HGBA1C 7.1 (H) 02/15/2018   HGBA1C 7.3 (A) 01/03/2018   HGBA1C 9.4 (H) 10/04/2017   HGBA1C 8.7 (H) 06/28/2017   HGBA1C 7.9 (H) 02/13/2017   HGBA1C 10.2 Repeated and verified X2. (H) 09/06/2016   HGBA1C 8.1 05/03/2016   Pt is on a regimen of: - Metformin (tablets) 500 mg in am and 1000 mg with dinner - NPH 52 units 3x a day - Novolog 12 (-18) units 3x a day, before meals She was on Lantus >> expensive.  Pt checks her sugars 1-2s a day and they are: - am: 80-140 - 2h after b'fast: n/c - before lunch: n/c - 2h after lunch: n/c - before dinner: n/c - 2h after dinner: n/c - bedtime: 50s, 115-120, 200 - nighttime: n/c Lowest sugar was 50; she has hypoglycemia awareness at 75.  Highest sugar was 300 - pizza.  Glucometer: [facility]  Pt's meals are: - Breakfast: oatmeal - instant, peacans, butter - Lunch: tortilla + PB and J or [place] and cheese - Dinner: meat (hamburger, chicken, bratwurst), spaghetti, green beans, mixed veggies - Snacks:   - no CKD, last BUN/creatinine:  Lab Results  Component Value Date   BUN 24 (H) 01/21/2019   BUN 21 10/05/2018   CREATININE 0.79 01/21/2019   CREATININE 0.82 10/05/2018   -+ HL; last set of lipids: Lab Results   Component Value Date   CHOL 136 10/05/2018   HDL 27.10 (L) 10/05/2018   LDLCALC 75 02/13/2017   LDLDIRECT 78.0 10/05/2018   TRIG 232.0 (H) 10/05/2018   CHOLHDL 5 10/05/2018  On pravastatin 20.  - last eye exam was in 06/2017: +DR.   - + numbness and tingling in her feet - ChTx related.  Pt has FH of DM in daughter, brother, father, P aunts.  She also has a history of endometrial cancer - 2019OSA, chemotherapy-induced peripheral neuropathy, hypothyroidism-on levothyroxine 125 mcg daily. Latest TSH was reviewed and this was normal: Lab Results  Component Value Date   TSH 1.88 10/05/2018   She lost a child at 49 y/o in 35 >> she gained 100 lbs and was dx'ed with DM afterwards.  No h/o pancreatitis or FH of MTC.  ROS: Constitutional: + Fatigue, + weight gain, + chills, + feeling cold, + poor sleep, + nocturia Eyes: no blurry vision, no xerophthalmia ENT: no sore throat, no nodules palpated in neck, no dysphagia, no odynophagia, no hoarseness, no tinnitus, no hypoacusis Cardiovascular: no CP, no SOB, no palpitations, no leg swelling Respiratory: no cough, no SOB, + wheezing Gastrointestinal: no N, no V, no D, no C, no acid reflux Musculoskeletal: no muscle, + joint aches , +bone pain   Skin: no rash, no hair loss Neurological: no tremors, + numbness and tingling/no dizziness/no HAs Psychiatric: no  depression, no anxiety  Past Medical History:  Diagnosis Date  . Allergic rhinitis    Allergy testing: results pending as of 05/28/16 (Dr. Harold Hedge)  . Anemia   . Arthritis   . Chemotherapy-induced neuropathy ([place]) 2019  . Cough variant asthma   . DDD (degenerative disc disease), lumbar   . Diabetes mellitus with complication ([place])    Mild nonprolif DR R eye 12/2017-->referred to retinal specialist  . Endometrial cancer ([place]) 02/2018   REMISSION AS OF 08/2018.  Stage III (T3, Nx, Mx)  Path: endometroid adenocarcinoma involving cervix, uterus, and fallopian tubes (Pt got TAH &  BSO 02/2018).  . GERD (gastroesophageal reflux disease)   . Hemorrhoids   . Herpes zoster 10/2015   L side belt-line  . History of adenomatous polyp of colon 02/25/13   5 mm cecal polyp removed by Dr. Trevor Mace 5 yrs  . History of blood transfusion   . History of cellulitis 08/2010   Left (Since replacemnt of Left knee  . Hyperkalemia 03/2018   started after pt started getting chemo-->had to stop enalapril.  Marland Kitchen Hyperlipidemia    Not on statin b/c lipids "stayed down when sugars came down" per pt.  She says Dr. Chalmers Cater knows she is not on statin anymore.  . Hypertension   . Hypothyroidism   . Nephrolithiasis 4/07  . Obesity   . OSA on CPAP   . Osteoarthritis    knees, ankle, + ? scapholunate ligament disruption (x-ray 06/2017)--ortho referral.  . Pancytopenia due to antineoplastic chemotherapy ([place])    progressive as of 06/2018. Stable 08/2018.  Marland Kitchen Recurrent UTI    started cipro 250 qd 09/2018  . Sciatica of left side   . Severe persistent asthma    cough-variant--saw Allergist 05/25/16 and was switched from max dose advair to symbicort.  . Systolic murmur    ECHO fine 10/2016-->murmur likely flow murmur assoc with HTN.  Marland Kitchen Zoon's vulvitis    Bx-proven (GYN) -lichen sclerosis.  Clobetasol 0.05% ointment per GYN   Past Surgical History:  Procedure Laterality Date  . ANKLE FRACTURE SURGERY  1984   Pin & repair  . ARTHROSCOPIC REPAIR ACL  2000   Due to ACL tear  . ARTHROSCOPIC REPAIR ACL  5/08  . Blateral knee rerlacements x4 2 times each knee    . BREAST BIOPSY Right 2014   Benign  . CARDIAC CATHETERIZATION  5/07   clear vessel mild mitral   . CARPAL TUNNEL RELEASE Right [id number]   1989 Left  . CESAREAN SECTION  1985  . CHOLECYSTECTOMY OPEN  1988  . COLONOSCOPY N/A 02/25/2013   Tubular adenoma x 1: Recall 5 yrs. Procedure: COLONOSCOPY;  Surgeon: Juanita Craver, MD;  Location: [facility];  Service: Endoscopy;  Laterality: N/A;  . COMBINED HYSTEROSCOPY DIAGNOSTIC / D&C  2/12    Bx neg  . DEXA  08/2007   Bone density normal.  . DILATION AND CURETTAGE OF UTERUS  12/11  . IR IMAGING GUIDED PORT INSERTION  03/16/2018  . IR REMOVAL TUN ACCESS W/ PORT W/O FL MOD SED  09/20/2018  . LYMPH NODE BIOPSY N/A 02/19/2018   Procedure: Sentinel LYMPH NODE BIOPSY;  Surgeon: Everitt Amber, MD;  Location: [facility];  Service: Gynecology;  Laterality: N/A;  . REPLACEMENT TOTAL KNEE Left 5/12   X 2 on each  . ROBOTIC ASSISTED TOTAL HYSTERECTOMY WITH BILATERAL SALPINGO OOPHERECTOMY N/A 02/19/2018   For endometrial cancer.  Procedure: XI ROBOTIC ASSISTED TOTAL HYSTERECTOMY WITH BILATERAL  SALPINGO OOPHORECTOMY;  Surgeon: Everitt Amber, MD;  Location: [facility];  Service: Gynecology;  Laterality: N/A;  . TRANSTHORACIC ECHOCARDIOGRAM  10/11/2016    EF 55-60%, grd I DD.  . TUBAL LIGATION  1985   C-Section   Social History   Socioeconomic History  . Marital status: Married    Spouse name: Louie Casa  . Number of children: 2  . Years of education: Not on file  . Highest education level: Not on file  Occupational History  . Occupation: retired Therapist, sports  Tobacco Use  . Smoking status: Never Smoker  . Smokeless tobacco: Never Used  Substance and Sexual Activity  . Alcohol use: No    Alcohol/week: 0.0 standard drinks  . Drug use: No  . Sexual activity: Never    Partners: Male    Birth control/protection: I.U.D.  Other Topics Concern  . Not on file  Social History Narrative  . Not on file   Social Determinants of Health   Financial Resource Strain:   . Difficulty of Paying Living Expenses: Not on file  Food Insecurity:   . Worried About Charity fundraiser in the Last Year: Not on file  . Ran Out of Food in the Last Year: Not on file  Transportation Needs:   . Lack of Transportation (Medical): Not on file  . Lack of Transportation (Non-Medical): Not on file  Physical Activity:   . Days of Exercise per Week: Not on file  . Minutes of Exercise per  Session: Not on file  Stress:   . Feeling of Stress : Not on file  Social Connections:   . Frequency of Communication with Friends and Family: Not on file  . Frequency of Social Gatherings with Friends and Family: Not on file  . Attends Religious Services: Not on file  . Active Member of Clubs or Organizations: Not on file  . Attends Archivist Meetings: Not on file  . Marital Status: Not on file  Intimate Partner Violence:   . Fear of Current or Ex-Partner: Not on file  . Emotionally Abused: Not on file  . Physically Abused: Not on file  . Sexually Abused: Not on file   Current Outpatient Medications on File Prior to Visit  Medication Sig Dispense Refill  . albuterol (PROVENTIL) (2.5 MG/3ML) 0.083% nebulizer solution Take 3 mLs (2.5 mg total) by nebulization every 4 (four) hours as needed for wheezing or shortness of breath (Dx: J45.50). 75 mL 1  . albuterol (VENTOLIN HFA) 108 (90 Base) MCG/ACT inhaler INHALE 2 PUFFS INTO LUNGS EVERY 6 HOURS AS NEEDED FOR WHEEZING/SHORTNESS OF BREATH 18 g 2  . clobetasol ointment (TEMOVATE) [id number] % APPLY 1 APPLICATION TOPICALLY AT BEDTIME. APPLY TO THE SKIN OF THE VULVA FOR 12 WEEKS 30 g 1  . felodipine (PLENDIL) 10 MG 24 hr tablet TAKE 1 TABLET BY MOUTH EVERY DAY 90 tablet 1  . fluticasone (CUTIVATE) 0.05 % cream APPLY TO AFFECTED AREA OF R LOWER LEG TWICE PER DAY. 30 g 0  . fluticasone (FLONASE SENSIMIST) 27.5 MCG/SPRAY nasal spray Place 1 spray into the nose daily as needed for rhinitis.    . furosemide (LASIX) 20 MG tablet TAKE 1 TABLET BY MOUTH EVERY DAY 90 tablet 1  . HYDROcodone-acetaminophen (NORCO/VICODIN) 5-325 MG tablet 1-2 tabs po bid prn pain 90 tablet 0  . insulin aspart (NOVOLOG) 100 UNIT/ML injection USE PER SLIDING SCALE AT EACH MEAL (AVERAGE USE IS 17 UNITS 3 TIMES DAILY) 50 mL 2  .  insulin NPH Human (NOVOLIN N) 100 UNIT/ML injection 62 U SQ qAM, 62 U SQ mid day, and 60 U SQ q evening 50 mL 5  . Insulin Syringes, Disposable,  U-100 0.3 ML MISC Use to inject insulin--6 injections per day 540 each 3  . levocetirizine (XYZAL) 5 MG tablet Take 5 mg by mouth at bedtime as needed for allergies.   5  . levothyroxine (SYNTHROID) 125 MCG tablet 1 tab po qd 90 tablet 3  . meloxicam (MOBIC) 15 MG tablet TAKE 1 TABLET BY MOUTH EVERY DAY AS NEEDED 30 tablet 3  . metFORMIN (GLUCOPHAGE) 500 MG tablet TAKE 1 TABLET BY MOUTH EVERY MORNING AND 2 TABLETS BY MOUTH AT BEDTIME. 270 tablet 1  . miconazole (MICRO GUARD) 2 % powder Apply 1 application topically 2 (two) times daily as needed for itching.    . montelukast (SINGULAIR) 10 MG tablet Take 1 tablet (10 mg total) by mouth every evening. 90 tablet 3  . pravastatin (PRAVACHOL) 20 MG tablet Take 1 tablet (20 mg total) by mouth daily. 90 tablet 0  . Probiotic Product (PROBIOTIC PO) Take 1 capsule by mouth daily.    . SYMBICORT 160-4.5 MCG/ACT inhaler TAKE 2 PUFFS BY MOUTH TWICE A DAY 10.2 Inhaler 0  . [DISCONTINUED] prochlorperazine (COMPAZINE) 10 MG tablet Take 1 tablet (10 mg total) by mouth every 6 (six) hours as needed (Nausea or vomiting). 30 tablet 1   No current facility-administered medications on file prior to visit.   Allergies  Allergen Reactions  . Adhesive [Tape] Other (See Comments)    Takes patient's skin off.  . Banana Diarrhea and Nausea Only  . Eggs Or Egg-Derived Products Diarrhea and Nausea Only  . Latex Other (See Comments)    sensativity  . Penicillins Rash and Other (See Comments)    Has had cephalosporins without incident  . Pine Swelling and Rash  . Rose Swelling and Rash   Family History  Problem Relation Age of Onset  . Diabetes Father   . COPD Father   . Stroke Father   . Celiac disease Brother   . Rheum arthritis Brother   . Thyroid disease Brother   . Diabetes Brother   . Alzheimer's disease Mother   . Arthritis Mother   . Other Son        Died age 59 -Tetrology of Fallot - VSD/pulmonary atresia  . Breast cancer Other         postmenopausal when diagnosed    PE: BP (!) 128/58   Pulse 92   Ht 5\' 1"  (1.549 m)   Wt 274 lb (124.3 kg)   LMP 08/10/2010 Comment: Hysterectomy 02/02/18  SpO2 98%   BMI 51.77 kg/m  Wt Readings from Last 3 Encounters:  05/07/19 274 lb (124.3 kg)  01/16/19 258 lb (117 kg)  11/16/18 260 lb 3.2 oz (118 kg)   Constitutional: overweight, in NAD Eyes: PERRLA, EOMI, no exophthalmos ENT: moist mucous membranes, no thyromegaly, no cervical lymphadenopathy Cardiovascular: RRR, No MRG Respiratory: CTA B Gastrointestinal: abdomen soft, NT, ND, BS+ Musculoskeletal: no deformities, strength intact in all 4 Skin: moist, warm, no rashes Neurological: no tremor with outstretched hands, DTR normal in all 4  ASSESSMENT: 1. DM2, insulin-dependent, uncontrolled, with complications - mild NP DR OD  PLAN:  1. Patient with long-standing, uncontrolled diabetes, on Metformin + basal-bolus insulin regimen, which became insufficient.  Latest HbA1c was higher than target, 8.8%.  At today's visit, HbA1c was 6.6% (lower). -However, patient describes having  frequent hypoglycemia episodes, especially later in the day but sometimes during the night.  Lowest blood sugar: 50. -She is on a high dose of NPH and we discussed about reducing the dose by 66 units today.  I will advise her to take 30 units before breakfast and lunch and also 30 units at bedtime.  The next visit, I would like to reduce the doses to only take them twice a day.  In the meantime, I advised her to add a few units to NovoLog in case she has a larger meal but overall I gave her a more flexible regimen for the rapid acting insulin.  We will also try to add a GLP-1 receptor agonist in an effort to be able to decrease her insulin doses even more and improve her weight, cardiovascular outcomes and kidney outcomes. - I suggested to:  Patient Instructions  Please continue: - Metformin 500 mg in am and 1000 mg with dinner  Please decrease: - NPH 30  units 3x a day - Novolog 10-14 units 3x a day, before meals  Please start: - Ozempic 0.25 mg weekly in a.m. (for example on Sunday morning) x 4 weeks, then increase to 0.5 mg weekly in a.m. if no nausea or hypoglycemia.  Please let me know if the sugars are consistently <80 or >200.  Please come back for a follow-up appointment in 3 months.  - Strongly advised her to check sugars at different times of the day - check 3x a day, rotating checks - discussed about CBG targets for treatment: 80-130 mg/dL before meals and <180 mg/dL after meals; target HbA1c <7%. - given sugar log and advised how to fill it and to bring it at next appt  - given foot care handout and explained the principles  - given instructions for hypoglycemia management "15-15 rule"  - advised for yearly eye exams  - Return to clinic in 3 mo with sugar log   Philemon Kingdom, MD PhD [facility] Endocrinology

## 2019-05-15 ENCOUNTER — Encounter: Payer: Self-pay | Admitting: Family Medicine

## 2019-05-16 ENCOUNTER — Telehealth: Payer: Self-pay | Admitting: *Deleted

## 2019-05-16 NOTE — Telephone Encounter (Signed)
Called and moved her 2/8 appt to earlier in the day

## 2019-05-20 ENCOUNTER — Encounter: Payer: Self-pay | Admitting: Gynecologic Oncology

## 2019-05-20 ENCOUNTER — Other Ambulatory Visit: Payer: Self-pay

## 2019-05-20 ENCOUNTER — Inpatient Hospital Stay: Payer: Medicare Other | Attending: Gynecologic Oncology | Admitting: Gynecologic Oncology

## 2019-05-20 VITALS — BP 141/48 | HR 94 | Temp 98.3°F | Resp 20 | Ht 61.0 in | Wt 269.0 lb

## 2019-05-20 DIAGNOSIS — L280 Lichen simplex chronicus: Secondary | ICD-10-CM | POA: Diagnosis not present

## 2019-05-20 DIAGNOSIS — Z794 Long term (current) use of insulin: Secondary | ICD-10-CM | POA: Insufficient documentation

## 2019-05-20 DIAGNOSIS — E119 Type 2 diabetes mellitus without complications: Secondary | ICD-10-CM | POA: Diagnosis not present

## 2019-05-20 DIAGNOSIS — Z90710 Acquired absence of both cervix and uterus: Secondary | ICD-10-CM | POA: Insufficient documentation

## 2019-05-20 DIAGNOSIS — C541 Malignant neoplasm of endometrium: Secondary | ICD-10-CM | POA: Diagnosis not present

## 2019-05-20 DIAGNOSIS — Z90722 Acquired absence of ovaries, bilateral: Secondary | ICD-10-CM | POA: Diagnosis not present

## 2019-05-20 DIAGNOSIS — Z923 Personal history of irradiation: Secondary | ICD-10-CM | POA: Diagnosis not present

## 2019-05-20 DIAGNOSIS — E669 Obesity, unspecified: Secondary | ICD-10-CM | POA: Insufficient documentation

## 2019-05-20 DIAGNOSIS — Z9221 Personal history of antineoplastic chemotherapy: Secondary | ICD-10-CM | POA: Diagnosis not present

## 2019-05-20 NOTE — Patient Instructions (Signed)
Plan to follow up in August 2021 after you see Dr. Sondra Come in May.  Please have Dr. Clabe Seal office give our office a call after your May 2021 visit to arrange for an appointment in our office.  Please call for any questions, concerns, or new symptoms such as pain, vaginal bleeding.

## 2019-05-20 NOTE — Progress Notes (Signed)
[place] [facility]   Follow-up Note: New Patient  Consult was initially requested by Dr. Hale Bogus for Grade 1 Endometrial Adenocarcinoma   Chief Complaint  Patient presents with  . Endometrial cancer, FIGO stage IIIA (HCC)    Follow up     Assessment  Endometrial Cancer: stage IIIA grade 1 endometrioid (MMR normal, MSI stable) s/p staging surgery on 02/19/18. S/p adjuvant carboplatin and paclitaxel x 6 completed April, 2020. S/p adjuvant vaginal brachy completed February, 2020.   No evidence of recurrence.  Lichen sclerosis   Plan  Recommend ongoing 3 montly surveillance until April, 2022, then 6 monthly surveillance until April, 2025.   HPI: [name]  is a 69 y.o.  P2, - a retired Marine scientist  She had a h/o complex hyperplasia without atypia and was treated with a Mirena IUD x 5 years. ~2017 this was removed and she was noted to have followup biopsies with no residual hyperplasia. Then in July 2019 she had spotting for a few days. This resolved and then she noted another episode early Oct 2019. She presented to Dr. Sabra Heck and a TVUS was perfomed showing a thickened EM stripe with an area showing vascular flow. Therefore an EMB was performed and that revealed: Endometrium, biopsy - ENDOMETRIOID ADENOCARCINOMA, FIGO GRADE 1. - SEE COMMENT. Microscopic Comment Internal departmental review obtained (Dr. Lyndon Code) with agreement. Results are phoned to [facility] in the office of Dr. Edwinna Areola. (MEG:ah 01/31/18) [name] MD  She was therefore referred for management and recommendations  NOTABLE is her obesity. She states since losing her son, who died at 46 years old as complications from his Tetrology of Fallot, she has been overweight.   Adhesive allergy reviewed - She has done OK with steristrips after knee surgery PCN allergy reviewed - she has used Keflex without issue  States last HgbA1C was 7.3 12/2017  Imported  EPIC Oncologic History:  Oncology History Overview Note  Endometrioid MSI stable   Endometrial cancer ([place])  01/24/2018 Initial Diagnosis   Patient was seen by GYN for PMB since July 2019.  Pt has hx of complex endometrial hyperplasia without atypica that was treated with Mirena IUD for 5 years.  This was removed about two years ago.  She did have follow up biopsies after placement and did have resolution of hyperplasia.    Patient's last menstrual period was 08/10/2010.   01/25/2018 Pathology Results   Endometrium, biopsy - ENDOMETRIOID ADENOCARCINOMA, FIGO GRADE 1. - SEE COMMENT.   01/25/2018 Procedure   She had endometrial biopsy in GYN office   01/25/2018 Imaging   US pelvis  UTERUS: 8.5 x 5.3 x 4.5cm EMS: 18-39m, areas of vascular flow noted as well ADNEXA: Left ovary: not seen transvaginally or transabdominally due to habitus                  Right ovary: same as with left ovary CUL DE SAC: no free fluid    02/19/2018 Pathology Results   Uterus +/- tubes/ovaries, neoplastic, cervix - UTERUS: -ENDO/MYOMETRIUM: INVASIVE ENDOMETRIOID ADENOCARCINOMA WITH FOCAL SQUAMOUS DIFFERENTIATION (FIGO GRADE 1), SPANNING 5.8 CM. TUMOR INVADES LESS THAN ONE HALF OF THE MYOMETRIUM. TUMOR INVOLVES CERVICAL STROMA AND BILATERAL FALLOPIAN TUBE LUMEN. SEE ONCOLOGY TABLE. -SEROSA: UNREMARKABLE. NO MALIGNANCY. - CERVIX: STROMAL INVOLVEMENT BY ENDOMETRIOID ADENOCARCINOMA. - BILATERAL OVARIES: INCLUSION CYSTS. NO MALIGNANCY. - BILATERAL FALLOPIAN TUBES: LUMEN INVOLVEMENT BY ENDOMETRIOID ADENOCARCINOMA. Microscopic Comment UTERUS, CARCINOMA OR CARCINOSARCOMA Procedure: Total hysterectomy with bilateral  salpingo-oophorectomy. Histologic type: Endometrioid adenocarcinoma with squamous differentiation. Histologic Grade: FIGO grade 1. Myometrial invasion: Depth of invasion: 3 mm Myometrial thickness: 17 mm Uterine Serosa Involvement: Not identified. Cervical stromal involvement:  Present. Extent of involvement of other organs: Involves cervical stroma and lumen of bilateral fallopian tubes. Lymphovascular invasion: Not identified. Regional Lymph Nodes: None examined. Tumor block for ancillary studies: 1D-G MMR / MSI testing: Will be ordered.   02/19/2018 Surgery   Surgeon: [name]   Operation: Robotic-assisted laparoscopic total hysterectomy with bilateral salpingoophorectomy (22 modifier for extreme obesity - see details in body of operative note)   Operative Findings:  : extreme intraperitoneal adiposity which prevented retroperitoneal visualization of the lymph node basins and increased the duration of the procedure by 1 hour (for additional instrumentation, placement of retraction sutures, additional personnel required for assistance.  8cm uterus, grossly normal ovaries, surgically interrupted tubes   03/05/2018 Imaging   1. Upper normal pelvic sidewall lymph nodes bilaterally. Close attention on follow-up recommended. 2. No other findings to suggest metastatic disease in the chest, abdomen, or pelvis. 3.  Aortic Atherosclerois (ICD10-170.0)    03/12/2018 Cancer Staging   Staging form: Corpus Uteri - Carcinoma and Carcinosarcoma, AJCC 8th Edition - Pathologic: Stage III (pT3, pN0, cM0) - Signed by Heath Lark, MD on 03/12/2018   03/16/2018 Procedure   Ultrasound and fluoroscopically guided right internal jugular single lumen power port catheter insertion. Tip in the SVC/RA junction. Catheter ready for use.   03/20/2018 - 07/17/2018 Chemotherapy   The patient had carboplatin and taxol x 6 cycles   04/24/2018 - 05/21/2018 Radiation Therapy   Radiation treatment dates:   04/24/18, 05/03/18, 05/07/18, 05/16/18, 05/21/18  Site/dose:  Vaginal cuff, 6 Gy in 5 fractions for a total dose of 30 Gy  Beams/energy:   HDR Ir-Vaginal, Iridium HDR,patient was treated with a 2.5 cm diameter segmented cylinder, Rxlength of 3 cm, prescription was 6 gray to the  mucosal surface    08/16/2018 Imaging   1. Status post hysterectomy. No CT evidence of metastatic disease in the abdomen or pelvis. Unchanged appearance of subcentimeter pelvic sidewall lymph nodes.  2. Other chronic, incidental, and postoperative findings as detailed above.   09/20/2018 Procedure   Removal of implanted Port-A-Cath utilizing sharp and blunt dissection. The procedure was uncomplicated     Measurement of disease: . TBD  Radiology: . 01/25/18 - TVUS  OSH - UTERUS: 8.5 x 5.3 x 4.5cm EMS: 18-67m, areas of vascular flow noted as well ADNEXA: Left ovary: not seen transvaginally or transabdominally due to habitus Right ovary: same as with left ovary CUL DE SAC: no free fluid . CT chest/abd/pelvis on 03/05/18 (postop) showed upper limit size pelvic nodes, but no other signs of metastatic disease.     Interval Hx:  Since completing chemotherapy in April, 2020 she has had persistent neuropathy, fatigue and vulvar irritation.  Her diabetes remains under modest control. Her most recent HbA1c was 6.6. She was started on Ozempic.   Outpatient Encounter Medications as of 05/20/2019  Medication Sig  . albuterol (PROVENTIL) (2.5 MG/3ML) 0.083% nebulizer solution Take 3 mLs (2.5 mg total) by nebulization every 4 (four) hours as needed for wheezing or shortness of breath (Dx: J45.50).  .Marland Kitchenalbuterol (VENTOLIN HFA) 108 (90 Base) MCG/ACT inhaler INHALE 2 PUFFS INTO LUNGS EVERY 6 HOURS AS NEEDED FOR WHEEZING/SHORTNESS OF BREATH  . clobetasol ointment (TEMOVATE) 08.85% APPLY 1 APPLICATION TOPICALLY AT BEDTIME. APPLY TO THE SKIN OF THE VULVA FOR 12  WEEKS  . felodipine (PLENDIL) 10 MG 24 hr tablet TAKE 1 TABLET BY MOUTH EVERY DAY  . fluticasone (CUTIVATE) 0.05 % cream APPLY TO AFFECTED AREA OF R LOWER LEG TWICE PER DAY.  . fluticasone (FLONASE SENSIMIST) 27.5 MCG/SPRAY nasal spray Place 1 spray into the nose daily as needed for rhinitis.  . furosemide (LASIX) 20 MG tablet TAKE 1 TABLET BY MOUTH  EVERY DAY  . HYDROcodone-acetaminophen (NORCO/VICODIN) 5-325 MG tablet 1-2 tabs po bid prn pain  . insulin aspart (NOVOLOG) 100 UNIT/ML injection Inject under skin 10-14 UNITS 3 TIMES DAILY  . insulin NPH Human (NOVOLIN N) 100 UNIT/ML injection Inject 30 units 3x a day under skin  . Insulin Syringes, Disposable, U-100 0.3 ML MISC Use to inject insulin--6 injections per day  . levocetirizine (XYZAL) 5 MG tablet Take 5 mg by mouth at bedtime as needed for allergies.   Marland Kitchen levothyroxine (SYNTHROID) 125 MCG tablet 1 tab po qd  . meloxicam (MOBIC) 15 MG tablet TAKE 1 TABLET BY MOUTH EVERY DAY AS NEEDED  . metFORMIN (GLUCOPHAGE) 500 MG tablet TAKE 1 TABLET BY MOUTH EVERY MORNING AND 2 TABLETS BY MOUTH AT BEDTIME.  . miconazole (MICRO GUARD) 2 % powder Apply 1 application topically 2 (two) times daily as needed for itching.  . montelukast (SINGULAIR) 10 MG tablet Take 1 tablet (10 mg total) by mouth every evening.  . pravastatin (PRAVACHOL) 20 MG tablet Take 1 tablet (20 mg total) by mouth daily.  . Probiotic Product (PROBIOTIC PO) Take 1 capsule by mouth daily.  . Semaglutide,0.25 or 0.5MG/DOS, (OZEMPIC, 0.25 OR 0.5 MG/DOSE,) 2 MG/1.5ML SOPN Inject 0.5 mg into the skin once a week.  . SYMBICORT 160-4.5 MCG/ACT inhaler TAKE 2 PUFFS BY MOUTH TWICE A DAY  . [DISCONTINUED] prochlorperazine (COMPAZINE) 10 MG tablet Take 1 tablet (10 mg total) by mouth every 6 (six) hours as needed (Nausea or vomiting).   No facility-administered encounter medications on file as of 05/20/2019.   Allergies  Allergen Reactions  . Adhesive [Tape] Other (See Comments)    Takes patient's skin off.  . Banana Diarrhea and Nausea Only  . Eggs Or Egg-Derived Products Diarrhea and Nausea Only  . Latex Other (See Comments)    sensativity  . Penicillins Rash and Other (See Comments)    Has had cephalosporins without incident  . Pine Swelling and Rash  . Rose Swelling and Rash    Past Medical History:  Diagnosis Date  . Allergic  rhinitis    Allergy testing: results pending as of 05/28/16 (Dr. Harold Hedge)  . Anemia   . Arthritis   . Chemotherapy-induced neuropathy ([place]) 2019  . Cough variant asthma   . DDD (degenerative disc disease), lumbar   . Diabetes mellitus with complication (HCC)    Mild nonprolif DR R eye 12/2017-->referred to retinal specialist DM MANAGED BY DR. GHERGHE AS OF 2021  . Endometrial cancer ([place]) 02/2018   REMISSION AS OF 08/2018.  Stage III (T3, Nx, Mx)  Path: endometroid adenocarcinoma involving cervix, uterus, and fallopian tubes (Pt got TAH & BSO 02/2018).  . GERD (gastroesophageal reflux disease)   . Hemorrhoids   . Herpes zoster 10/2015   L side belt-line  . History of adenomatous polyp of colon 02/25/13   5 mm cecal polyp removed by Dr. Trevor Mace 5 yrs  . History of blood transfusion   . History of cellulitis 08/2010   Left (Since replacemnt of Left knee  . Hyperkalemia 03/2018   started after pt  started getting chemo-->had to stop enalapril.  Marland Kitchen Hyperlipidemia    Not on statin b/c lipids "stayed down when sugars came down" per pt.  She says Dr. Chalmers Cater knows she is not on statin anymore.  . Hypertension   . Hypothyroidism   . Nephrolithiasis 4/07  . Obesity   . OSA on CPAP   . Osteoarthritis    knees, ankle, + ? scapholunate ligament disruption (x-ray 06/2017)--ortho referral.  . Pancytopenia due to antineoplastic chemotherapy ([place])    progressive as of 06/2018. Stable 08/2018.  Marland Kitchen Recurrent UTI    started cipro 250 qd 09/2018  . Sciatica of left side   . Severe persistent asthma    cough-variant--saw Allergist 05/25/16 and was switched from max dose advair to symbicort.  . Systolic murmur    ECHO fine 10/2016-->murmur likely flow murmur assoc with HTN.  Marland Kitchen Zoon's vulvitis    Bx-proven (GYN) -lichen sclerosis.  Clobetasol 0.05% ointment per GYN   Past Surgical History:  Procedure Laterality Date  . ANKLE FRACTURE SURGERY  1984   Pin & repair  . ARTHROSCOPIC REPAIR ACL  2000    Due to ACL tear  . ARTHROSCOPIC REPAIR ACL  5/08  . Blateral knee rerlacements x4 2 times each knee    . BREAST BIOPSY Right 2014   Benign  . CARDIAC CATHETERIZATION  5/07   clear vessel mild mitral   . CARPAL TUNNEL RELEASE Right [id number]   1989 Left  . CESAREAN SECTION  1985  . CHOLECYSTECTOMY OPEN  1988  . COLONOSCOPY N/A 02/25/2013   Tubular adenoma x 1: Recall 5 yrs. Procedure: COLONOSCOPY;  Surgeon: Juanita Craver, MD;  Location: [facility];  Service: Endoscopy;  Laterality: N/A;  . COMBINED HYSTEROSCOPY DIAGNOSTIC / D&C  2/12   Bx neg  . DEXA  08/2007   Bone density normal.  . DILATION AND CURETTAGE OF UTERUS  12/11  . IR IMAGING GUIDED PORT INSERTION  03/16/2018  . IR REMOVAL TUN ACCESS W/ PORT W/O FL MOD SED  09/20/2018  . LYMPH NODE BIOPSY N/A 02/19/2018   Procedure: Sentinel LYMPH NODE BIOPSY;  Surgeon: Everitt Amber, MD;  Location: [facility];  Service: Gynecology;  Laterality: N/A;  . REPLACEMENT TOTAL KNEE Left 5/12   X 2 on each  . ROBOTIC ASSISTED TOTAL HYSTERECTOMY WITH BILATERAL SALPINGO OOPHERECTOMY N/A 02/19/2018   For endometrial cancer.  Procedure: XI ROBOTIC ASSISTED TOTAL HYSTERECTOMY WITH BILATERAL SALPINGO OOPHORECTOMY;  Surgeon: Everitt Amber, MD;  Location: [facility];  Service: Gynecology;  Laterality: N/A;  . TRANSTHORACIC ECHOCARDIOGRAM  10/11/2016    EF 55-60%, grd I DD.  . TUBAL LIGATION  1985   C-Section        Past Gynecological History:   GYNECOLOGIC HISTORY:  . Patient's last menstrual period was 08/10/2010.  . Menarche: 69 years old . P 2 . Contraceptive h/o Mirena IUD . HRT None  . Last Pap 10/2016 negative Family Hx:  Family History  Problem Relation Age of Onset  . Diabetes Father   . COPD Father   . Stroke Father   . Celiac disease Brother   . Rheum arthritis Brother   . Thyroid disease Brother   . Diabetes Brother   . Alzheimer's disease Mother   . Arthritis Mother   . Other Son        Died age  71 -Tetrology of Fallot - VSD/pulmonary atresia  . Breast cancer Other        postmenopausal  when diagnosed   Social Hx:  Marland Kitchen Tobacco use: none . Alcohol use: none . Illicit Drug use: none . Illicit IV Drug use: none    Review of Systems: Review of Systems  Constitutional: Positive for fatigue.  Genitourinary:        Vulvodynia  Neurological: Positive for numbness.       Foot neuropathy pain  All other systems reviewed and are negative.   Vitals:  Vitals:   05/20/19 1249  BP: (!) 141/48  Pulse: 94  Resp: 20  Temp: 98.3 F (36.8 C)  SpO2: 97%   Vitals:   05/20/19 1249  Weight: 269 lb (122 kg)  Height: '5\' 1"'  (1.549 m)   Body mass index is 50.83 kg/m.  Physical Exam: General :  Obese. Well developed, 69 y.o., female in no apparent distress HEENT:  Normocephalic/atraumatic, symmetric, EOMI, eyelids normal Neck:   Supple, no masses.  Lymphatics:  No cervical/ submandibular/ supraclavicular/ infraclavicular/ inguinal adenopathy Respiratory:  Respirations unlabored, no use of accessory muscles CV:   Deferred Breast:  Deferred Musculoskeletal: No CVA tenderness, normal muscle strength. Abdomen:  Obese with pannus. Soft, non-tender and nondistended. No evidence of hernia. No masses. Extremities:  No lymphedema, no erythema, non-tender. Skin:   Normal inspection; candida at mons/groin Neuro/Psych:  No focal motor deficit, no abnormal mental status. Normal gait. Normal affect. Alert and oriented to person, place, and time  Genito Urinary: vaginal cuff in tact no lesions or masses. White changes to butterfly distribution consistent with lichen sclerosis.   Cc: M. Edwinna Areola, MD (Referring Ob/Gyn) Anitra Lauth Adrian Blackwater, MD (PCP)

## 2019-05-26 ENCOUNTER — Encounter: Payer: Self-pay | Admitting: Family Medicine

## 2019-05-31 ENCOUNTER — Telehealth: Payer: Self-pay

## 2019-05-31 ENCOUNTER — Other Ambulatory Visit: Payer: Self-pay

## 2019-05-31 MED ORDER — BUDESONIDE-FORMOTEROL FUMARATE 160-4.5 MCG/ACT IN AERO: INHALATION_SPRAY | RESPIRATORY_TRACT | 11 refills | Status: DC

## 2019-05-31 NOTE — Telephone Encounter (Signed)
MyChart message sent notifying pt.

## 2019-05-31 NOTE — Telephone Encounter (Signed)
Patient is requesting refill SYMBICORT 160-4.5 MCG/ACT inhaler to [facility]. Please do not send in generic, patient states it is too expensive

## 2019-05-31 NOTE — Telephone Encounter (Signed)
OK done

## 2019-05-31 NOTE — Telephone Encounter (Signed)
Patient is requesting brand name for Symbicort inhaler 160-4.5MCG/ACT.  RF request for Symbicort inhaler LOV:04/22/19 Next ov: 07/19/19 Last written:04/22/19 (10.2,0)  Please advise, thanks.

## 2019-06-06 ENCOUNTER — Other Ambulatory Visit: Payer: Self-pay | Admitting: Family Medicine

## 2019-06-24 ENCOUNTER — Other Ambulatory Visit: Payer: Self-pay

## 2019-06-24 ENCOUNTER — Encounter: Payer: Self-pay | Admitting: Family Medicine

## 2019-06-24 ENCOUNTER — Ambulatory Visit (INDEPENDENT_AMBULATORY_CARE_PROVIDER_SITE_OTHER): Payer: Medicare Other | Admitting: Family Medicine

## 2019-06-24 VITALS — BP 158/66 | HR 98 | Temp 98.0°F | Resp 16 | Ht 61.0 in | Wt 270.6 lb

## 2019-06-24 DIAGNOSIS — L89222 Pressure ulcer of left hip, stage 2: Secondary | ICD-10-CM | POA: Diagnosis not present

## 2019-06-24 MED ORDER — CEPHALEXIN 500 MG PO CAPS: 500.0000 mg | ORAL_CAPSULE | Freq: Three times a day (TID) | ORAL | 0 refills | Status: DC

## 2019-06-24 MED ORDER — MUPIROCIN 2 % EX OINT: 1.0000 "application " | TOPICAL_OINTMENT | Freq: Three times a day (TID) | CUTANEOUS | 1 refills | Status: DC

## 2019-06-24 NOTE — Progress Notes (Signed)
See med student note above. Signed:  Crissie Sickles, MD           06/24/2019

## 2019-06-24 NOTE — Progress Notes (Signed)
CC: "bug bite" on L leg near groin  HPI:   Slid down stairs in Jan  Floor burn - put antibiotic and dried and healed  Around same area, this lesion about 3-4 weeks ago  Tender, painful  Outer rim feels "scab like"  Been at least three weeks  Getting sore  On back of leg near labia majora  Temporary topical antibiotic with analgesic,  helps a little bit  preparation ACE and vaseline made outer area softer but did not really help  Sitting/touching it makes it worse  Worsened over the past three weeks  Never had anything like this before   ROS: no fevers, no CP, no SOB, no wheezing, no cough, no dizziness, no HAs, no melena/hematochezia.  No polyuria or polydipsia.   No focal weakness, paresthesias, or tremors.  No acute vision or hearing abnormalities. No n/v/d or abd pain.  No palpitations.    PMH:  Psoriasis  Cancer   PSH:  M/A:  Lowered novelin (NPH insulin 65->30 units), put on ozampic 0.25mg  for 4 weeks, now up to 0.5  Allergic to adhesives  penicillin  Eggs, bananas Latex    FH:  SH: Diet good, 6.6 a1c   ROS:  Neuropathy in feet  Arthritis in knees (has had 4 knee replacements); degenerative arthritis ; have it in hands; R worse than L  Chills about every day w the chemo - night and day  No fever, sore throat, congestion, runny nose, headaches  Cough from asthma but well controlled  No changes in hearing, taste (got worse w chemo but has started improving), sight, smell  No changes to  Appetite Trouble sleeping bc of rolling onto lesion on her leg (v tender)   PE:  CV: RRR, no m/r/g, normal S1 S2, 2+ pulses bilaterally  Chest: CTAB , normal resp expansion, no CVA tenderness  Skin:  L gluteal / genital region grade II pressure ulcer about 1 cm in size with surrounding 3-4 mm of erythema, +tender to touch.  No drainage.  Probed with Q tip and it ended at the subQ tissue.  A/P Ulcer, pressure vs s/p abrasion sustained when she slid down her stairs 3 mo ago:   Plan: keflex (cephalexin - 500mg  three times a day) for 7 days , bactroban (mupirocin) 3x as needed (15g tube)  Suggested duoderm, will order home health aid to apply and wrap and monitor wound.   ADDENDUM: I saw pt, discussed with student, reviewed diff dx, discussed plan. I agree with all of above note. Signed:  Crissie Sickles, MD           06/24/2019

## 2019-07-01 ENCOUNTER — Encounter: Payer: Self-pay | Admitting: Family Medicine

## 2019-07-01 ENCOUNTER — Ambulatory Visit (INDEPENDENT_AMBULATORY_CARE_PROVIDER_SITE_OTHER): Payer: Medicare Other | Admitting: Family Medicine

## 2019-07-01 ENCOUNTER — Other Ambulatory Visit: Payer: Self-pay

## 2019-07-01 VITALS — BP 167/69 | HR 96 | Temp 98.0°F | Resp 16 | Ht 61.0 in | Wt 270.6 lb

## 2019-07-01 DIAGNOSIS — Z23 Encounter for immunization: Secondary | ICD-10-CM | POA: Diagnosis not present

## 2019-07-01 DIAGNOSIS — S31819D Unspecified open wound of right buttock, subsequent encounter: Secondary | ICD-10-CM

## 2019-07-01 NOTE — Progress Notes (Signed)
OFFICE VISIT  07/01/2019   CC:  Chief Complaint  Patient presents with  . Follow-up    recheck wound, 1 week    HPI:    Patient is a 69 y.o. Caucasian female who presents for 7 day f/u for recheck of wound in medial aspect of R inferior glut region. A/P as of last visit: "Ulcer, pressure vs s/p abrasion sustained when she slid down her stairs 3 mo ago:  Plan: keflex (cephalexin - 500mg  three times a day) for 7 days , bactroban (mupirocin) 3x as needed (15g tube)  Suggested duoderm, will order home health aid to apply and wrap and monitor wound.  Interim hx: Feeling and looking a little better.   No nurse--pt decided against this. Took keflex and still applying abx ointment. She is offloading well. No f/c/m.  No wound drainage.   Past Medical History:  Diagnosis Date  . Allergic rhinitis    Allergy testing: results pending as of 05/28/16 (Dr. Harold Hedge)  . Anemia   . Arthritis   . Chemotherapy-induced neuropathy ([place]) 2019  . Cough variant asthma   . DDD (degenerative disc disease), lumbar   . Diabetes mellitus with complication (HCC)    Mild nonprolif DR R eye 12/2017-->referred to retinal specialist DM MANAGED BY DR. GHERGHE AS OF 2021  . Endometrial cancer ([place]) 02/2018   REMISSION AS OF 08/2018.  Stage III (T3, Nx, Mx)  Path: endometroid adenocarcinoma involving cervix, uterus, and fallopian tubes (Pt got TAH & BSO 02/2018). No sign of recurrence as of onc f/u 05/2019.  Marland Kitchen GERD (gastroesophageal reflux disease)   . Hemorrhoids   . Herpes zoster 10/2015   L side belt-line  . History of adenomatous polyp of colon 02/25/13   5 mm cecal polyp removed by Dr. Trevor Mace 5 yrs  . History of blood transfusion   . History of cellulitis 08/2010   Left (Since replacemnt of Left knee  . Hyperkalemia 03/2018   started after pt started getting chemo-->had to stop enalapril.  Marland Kitchen Hyperlipidemia    Not on statin b/c lipids "stayed down when sugars came down" per pt.  She says Dr.  Chalmers Cater knows she is not on statin anymore.  . Hypertension   . Hypothyroidism   . Nephrolithiasis 4/07  . Obesity   . OSA on CPAP   . Osteoarthritis    knees, ankle, + ? scapholunate ligament disruption (x-ray 06/2017)--ortho referral.  . Pancytopenia due to antineoplastic chemotherapy ([place])    progressive as of 06/2018. Stable 08/2018.  Marland Kitchen Recurrent UTI    started cipro 250 qd 09/2018  . Sciatica of left side   . Severe persistent asthma    cough-variant--saw Allergist 05/25/16 and was switched from max dose advair to symbicort.  . Systolic murmur    ECHO fine 10/2016-->murmur likely flow murmur assoc with HTN.  Marland Kitchen Zoon's vulvitis    Bx-proven (GYN) -lichen sclerosis.  Clobetasol 0.05% ointment per GYN    Past Surgical History:  Procedure Laterality Date  . ANKLE FRACTURE SURGERY  1984   Pin & repair  . ARTHROSCOPIC REPAIR ACL  2000   Due to ACL tear  . ARTHROSCOPIC REPAIR ACL  5/08  . Blateral knee rerlacements x4 2 times each knee    . BREAST BIOPSY Right 2014   Benign  . CARDIAC CATHETERIZATION  5/07   clear vessel mild mitral   . CARPAL TUNNEL RELEASE Right [id number]   1989 Left  . CESAREAN SECTION  1985  .  CHOLECYSTECTOMY OPEN  1988  . COLONOSCOPY N/A 02/25/2013   Tubular adenoma x 1: Recall 5 yrs. Procedure: COLONOSCOPY;  Surgeon: Juanita Craver, MD;  Location: [facility];  Service: Endoscopy;  Laterality: N/A;  . COMBINED HYSTEROSCOPY DIAGNOSTIC / D&C  2/12   Bx neg  . DEXA  08/2007   Bone density normal.  . DILATION AND CURETTAGE OF UTERUS  12/11  . IR IMAGING GUIDED PORT INSERTION  03/16/2018  . IR REMOVAL TUN ACCESS W/ PORT W/O FL MOD SED  09/20/2018  . LYMPH NODE BIOPSY N/A 02/19/2018   Procedure: Sentinel LYMPH NODE BIOPSY;  Surgeon: Everitt Amber, MD;  Location: [facility];  Service: Gynecology;  Laterality: N/A;  . REPLACEMENT TOTAL KNEE Left 5/12   X 2 on each  . ROBOTIC ASSISTED TOTAL HYSTERECTOMY WITH BILATERAL SALPINGO OOPHERECTOMY N/A 02/19/2018    For endometrial cancer.  Procedure: XI ROBOTIC ASSISTED TOTAL HYSTERECTOMY WITH BILATERAL SALPINGO OOPHORECTOMY;  Surgeon: Everitt Amber, MD;  Location: [place];  Service: Gynecology;  Laterality: N/A;  . TRANSTHORACIC ECHOCARDIOGRAM  10/11/2016    EF 55-60%, grd I DD.  . TUBAL LIGATION  1985   C-Section    Outpatient Medications Prior to Visit  Medication Sig Dispense Refill  . albuterol (VENTOLIN HFA) 108 (90 Base) MCG/ACT inhaler INHALE 2 PUFFS INTO LUNGS EVERY 6 HOURS AS NEEDED FOR WHEEZING/SHORTNESS OF BREATH 18 g 2  . budesonide-formoterol (SYMBICORT) 160-4.5 MCG/ACT inhaler TAKE 2 PUFFS BY MOUTH TWICE A DAY 10.2 Inhaler 11  . clobetasol ointment (TEMOVATE) [id number] % APPLY 1 APPLICATION TOPICALLY AT BEDTIME. APPLY TO THE SKIN OF THE VULVA FOR 12 WEEKS 30 g 1  . felodipine (PLENDIL) 10 MG 24 hr tablet TAKE 1 TABLET BY MOUTH EVERY DAY 90 tablet 1  . fluticasone (CUTIVATE) 0.05 % cream APPLY TO AFFECTED AREA OF R LOWER LEG TWICE PER DAY. 30 g 0  . fluticasone (FLONASE SENSIMIST) 27.5 MCG/SPRAY nasal spray Place 1 spray into the nose daily as needed for rhinitis.    . furosemide (LASIX) 20 MG tablet TAKE 1 TABLET BY MOUTH EVERY DAY 90 tablet 1  . HYDROcodone-acetaminophen (NORCO/VICODIN) 5-325 MG tablet 1-2 tabs po bid prn pain 90 tablet 0  . insulin aspart (NOVOLOG) 100 UNIT/ML injection Inject under skin 10-14 UNITS 3 TIMES DAILY 50 mL 2  . insulin NPH Human (NOVOLIN N) 100 UNIT/ML injection Inject 30 units 3x a day under skin 50 mL 5  . Insulin Syringes, Disposable, U-100 0.3 ML MISC Use to inject insulin--6 injections per day 540 each 3  . levocetirizine (XYZAL) 5 MG tablet Take 5 mg by mouth at bedtime as needed for allergies.   5  . levothyroxine (SYNTHROID) 125 MCG tablet 1 tab po qd 90 tablet 3  . meloxicam (MOBIC) 15 MG tablet TAKE 1 TABLET BY MOUTH EVERY DAY AS NEEDED 30 tablet 3  . metFORMIN (GLUCOPHAGE) 500 MG tablet TAKE 1 TABLET BY MOUTH EVERY MORNING AND 2  TABLETS BY MOUTH AT BEDTIME. 270 tablet 1  . miconazole (MICRO GUARD) 2 % powder Apply 1 application topically 2 (two) times daily as needed for itching.    . mupirocin ointment (BACTROBAN) 2 % Apply 1 application topically 3 (three) times daily. 15 g 1  . pravastatin (PRAVACHOL) 20 MG tablet Take 1 tablet (20 mg total) by mouth daily. 90 tablet 0  . Probiotic Product (PROBIOTIC PO) Take 1 capsule by mouth daily.    . Semaglutide,0.25 or 0.5MG /DOS, (OZEMPIC, 0.25  OR 0.5 MG/DOSE,) 2 MG/1.5ML SOPN Inject 0.5 mg into the skin once a week. 2 pen 1  . cephALEXin (KEFLEX) 500 MG capsule Take 1 capsule (500 mg total) by mouth 3 (three) times daily. 21 capsule 0  . albuterol (PROVENTIL) (2.5 MG/3ML) 0.083% nebulizer solution Take 3 mLs (2.5 mg total) by nebulization every 4 (four) hours as needed for wheezing or shortness of breath (Dx: J45.50). (Patient not taking: Reported on 06/24/2019) 75 mL 1   No facility-administered medications prior to visit.    Allergies  Allergen Reactions  . Adhesive [Tape] Other (See Comments)    Takes patient's skin off.  . Banana Diarrhea and Nausea Only  . Eggs Or Egg-Derived Products Diarrhea and Nausea Only  . Latex Other (See Comments)    sensativity  . Penicillins Rash and Other (See Comments)    Has had cephalosporins without incident  . Pine Swelling and Rash  . Rose Swelling and Rash    ROS As per HPI  PE: Blood pressure (!) 167/69, pulse 96, temperature 98 F (36.7 C), temperature source Temporal, resp. rate 16, height 5\' 1"  (1.549 m), weight 270 lb 9.6 oz (122.7 kg), last menstrual period 08/10/2010, SpO2 92 %. Gen: Alert, well appearing.  Patient is oriented to person, place, time, and situation. AFFECT: pleasant, lucid thought and speech. R glut medial aspect with shallow 1-2 cm ulcer, tiny bit of granulation tissue present in bed, mild surrounding erythema but NO TENDERNESS.  No drainage.  No streaking.  No fluctuance or nodularity.  LABS:  Lab  Results  Component Value Date   WBC 10.1 01/21/2019   HGB 10.3 (L) 01/21/2019   HCT 31.5 (L) 01/21/2019   MCV 96.1 01/21/2019   PLT 223.0 01/21/2019   Lab Results  Component Value Date   HGBA1C 6.6 (A) 05/07/2019     Chemistry      Component Value Date/Time   NA 140 01/21/2019 1452   K 3.6 01/21/2019 1452   CL 102 01/21/2019 1452   CO2 27 01/21/2019 1452   BUN 24 (H) 01/21/2019 1452   CREATININE 0.79 01/21/2019 1452   CREATININE 0.92 07/17/2018 1028      Component Value Date/Time   CALCIUM 9.3 01/21/2019 1452   ALKPHOS 66 10/05/2018 1040   AST 20 10/05/2018 1040   AST 38 07/17/2018 1028   ALT 15 10/05/2018 1040   ALT 28 07/17/2018 1028   BILITOT 0.4 10/05/2018 1040   BILITOT 0.4 07/17/2018 1028       IMPRESSION AND PLAN:  Right glut wound, unknown etiology (exaggerated maceration from intertrigo?  Pressure ulcer? Deep abrasion from a low impact fall?).  Improving with 7d keflex and tid bactroban.  Air out in daytime if possible. Cover with wet to dry gauze in nighttime. 1 wk f/u recheck wound.  An After Visit Summary was printed and given to the patient.  FOLLOW UP: Return in about 1 week (around 07/08/2019) for f/u buttocks wound.  Signed:  Crissie Sickles, MD           07/01/2019

## 2019-07-08 ENCOUNTER — Encounter: Payer: Self-pay | Admitting: Family Medicine

## 2019-07-08 ENCOUNTER — Ambulatory Visit (INDEPENDENT_AMBULATORY_CARE_PROVIDER_SITE_OTHER): Payer: Medicare Other | Admitting: Family Medicine

## 2019-07-08 ENCOUNTER — Other Ambulatory Visit: Payer: Self-pay

## 2019-07-08 VITALS — BP 147/79 | HR 97 | Temp 97.7°F | Resp 16 | Ht 61.0 in | Wt 270.6 lb

## 2019-07-08 DIAGNOSIS — L89319 Pressure ulcer of right buttock, unspecified stage: Secondary | ICD-10-CM

## 2019-07-08 NOTE — Progress Notes (Signed)
OFFICE VISIT  07/08/2019   CC:  Chief Complaint  Patient presents with  . Follow-up    recheck wound, 1 week     HPI:    Patient is a 69 y.o. Caucasian female who presents for 1 wk f/u R gluteal wound. A/P as of last visit: "Right glut wound, unknown etiology (exaggerated maceration from intertrigo?  Pressure ulcer? Deep abrasion from a low impact fall?).  Improving with 7d keflex and tid bactroban.  Air out in daytime if possible. Cover with wet to dry gauze in nighttime. 1 wk f/u recheck wound"  Interim hx: "Maybe a little better". No fever.  Minimal pain when sitting right on it.  She is trying hard to off-load the area. Applying bactroban ointment tid.      Past Medical History:  Diagnosis Date  . Allergic rhinitis    Allergy testing: results pending as of 05/28/16 (Dr. Harold Hedge)  . Anemia   . Arthritis   . Chemotherapy-induced neuropathy ([place]) 2019  . Cough variant asthma   . DDD (degenerative disc disease), lumbar   . Diabetes mellitus with complication (HCC)    Mild nonprolif DR R eye 12/2017-->referred to retinal specialist DM MANAGED BY DR. GHERGHE AS OF 2021  . Endometrial cancer ([place]) 02/2018   REMISSION AS OF 08/2018.  Stage III (T3, Nx, Mx)  Path: endometroid adenocarcinoma involving cervix, uterus, and fallopian tubes (Pt got TAH & BSO 02/2018). No sign of recurrence as of onc f/u 05/2019.  Marland Kitchen GERD (gastroesophageal reflux disease)   . Hemorrhoids   . Herpes zoster 10/2015   L side belt-line  . History of adenomatous polyp of colon 02/25/13   5 mm cecal polyp removed by Dr. Trevor Mace 5 yrs  . History of blood transfusion   . History of cellulitis 08/2010   Left (Since replacemnt of Left knee  . Hyperkalemia 03/2018   started after pt started getting chemo-->had to stop enalapril.  Marland Kitchen Hyperlipidemia    Not on statin b/c lipids "stayed down when sugars came down" per pt.  She says Dr. Chalmers Cater knows she is not on statin anymore.  . Hypertension   .  Hypothyroidism   . Nephrolithiasis 4/07  . Obesity   . OSA on CPAP   . Osteoarthritis    knees, ankle, + ? scapholunate ligament disruption (x-ray 06/2017)--ortho referral.  . Pancytopenia due to antineoplastic chemotherapy ([place])    progressive as of 06/2018. Stable 08/2018.  Marland Kitchen Recurrent UTI    started cipro 250 qd 09/2018  . Sciatica of left side   . Severe persistent asthma    cough-variant--saw Allergist 05/25/16 and was switched from max dose advair to symbicort.  . Systolic murmur    ECHO fine 10/2016-->murmur likely flow murmur assoc with HTN.  Marland Kitchen Zoon's vulvitis    Bx-proven (GYN) -lichen sclerosis.  Clobetasol 0.05% ointment per GYN    Past Surgical History:  Procedure Laterality Date  . ANKLE FRACTURE SURGERY  1984   Pin & repair  . ARTHROSCOPIC REPAIR ACL  2000   Due to ACL tear  . ARTHROSCOPIC REPAIR ACL  5/08  . Blateral knee rerlacements x4 2 times each knee    . BREAST BIOPSY Right 2014   Benign  . CARDIAC CATHETERIZATION  5/07   clear vessel mild mitral   . CARPAL TUNNEL RELEASE Right [id number]   1989 Left  . CESAREAN SECTION  1985  . CHOLECYSTECTOMY OPEN  1988  . COLONOSCOPY N/A 02/25/2013   Tubular  adenoma x 1: Recall 5 yrs. Procedure: COLONOSCOPY;  Surgeon: Juanita Craver, MD;  Location: [facility];  Service: Endoscopy;  Laterality: N/A;  . COMBINED HYSTEROSCOPY DIAGNOSTIC / D&C  2/12   Bx neg  . DEXA  08/2007   Bone density normal.  . DILATION AND CURETTAGE OF UTERUS  12/11  . IR IMAGING GUIDED PORT INSERTION  03/16/2018  . IR REMOVAL TUN ACCESS W/ PORT W/O FL MOD SED  09/20/2018  . LYMPH NODE BIOPSY N/A 02/19/2018   Procedure: Sentinel LYMPH NODE BIOPSY;  Surgeon: Everitt Amber, MD;  Location: [facility];  Service: Gynecology;  Laterality: N/A;  . REPLACEMENT TOTAL KNEE Left 5/12   X 2 on each  . ROBOTIC ASSISTED TOTAL HYSTERECTOMY WITH BILATERAL SALPINGO OOPHERECTOMY N/A 02/19/2018   For endometrial cancer.  Procedure: XI ROBOTIC ASSISTED  TOTAL HYSTERECTOMY WITH BILATERAL SALPINGO OOPHORECTOMY;  Surgeon: Everitt Amber, MD;  Location: [employer];  Service: Gynecology;  Laterality: N/A;  . TRANSTHORACIC ECHOCARDIOGRAM  10/11/2016    EF 55-60%, grd I DD.  . TUBAL LIGATION  1985   C-Section    Outpatient Medications Prior to Visit  Medication Sig Dispense Refill  . albuterol (VENTOLIN HFA) 108 (90 Base) MCG/ACT inhaler INHALE 2 PUFFS INTO LUNGS EVERY 6 HOURS AS NEEDED FOR WHEEZING/SHORTNESS OF BREATH 18 g 2  . budesonide-formoterol (SYMBICORT) 160-4.5 MCG/ACT inhaler TAKE 2 PUFFS BY MOUTH TWICE A DAY 10.2 Inhaler 11  . clobetasol ointment (TEMOVATE) [id number] % APPLY 1 APPLICATION TOPICALLY AT BEDTIME. APPLY TO THE SKIN OF THE VULVA FOR 12 WEEKS 30 g 1  . felodipine (PLENDIL) 10 MG 24 hr tablet TAKE 1 TABLET BY MOUTH EVERY DAY 90 tablet 1  . fluticasone (CUTIVATE) 0.05 % cream APPLY TO AFFECTED AREA OF R LOWER LEG TWICE PER DAY. 30 g 0  . fluticasone (FLONASE SENSIMIST) 27.5 MCG/SPRAY nasal spray Place 1 spray into the nose daily as needed for rhinitis.    . furosemide (LASIX) 20 MG tablet TAKE 1 TABLET BY MOUTH EVERY DAY 90 tablet 1  . HYDROcodone-acetaminophen (NORCO/VICODIN) 5-325 MG tablet 1-2 tabs po bid prn pain 90 tablet 0  . insulin aspart (NOVOLOG) 100 UNIT/ML injection Inject under skin 10-14 UNITS 3 TIMES DAILY 50 mL 2  . insulin NPH Human (NOVOLIN N) 100 UNIT/ML injection Inject 30 units 3x a day under skin 50 mL 5  . Insulin Syringes, Disposable, U-100 0.3 ML MISC Use to inject insulin--6 injections per day 540 each 3  . levocetirizine (XYZAL) 5 MG tablet Take 5 mg by mouth at bedtime as needed for allergies.   5  . levothyroxine (SYNTHROID) 125 MCG tablet 1 tab po qd 90 tablet 3  . meloxicam (MOBIC) 15 MG tablet TAKE 1 TABLET BY MOUTH EVERY DAY AS NEEDED 30 tablet 3  . metFORMIN (GLUCOPHAGE) 500 MG tablet TAKE 1 TABLET BY MOUTH EVERY MORNING AND 2 TABLETS BY MOUTH AT BEDTIME. 270 tablet 1  . miconazole  (MICRO GUARD) 2 % powder Apply 1 application topically 2 (two) times daily as needed for itching.    . mupirocin ointment (BACTROBAN) 2 % Apply 1 application topically 3 (three) times daily. 15 g 1  . pravastatin (PRAVACHOL) 20 MG tablet Take 1 tablet (20 mg total) by mouth daily. 90 tablet 0  . Probiotic Product (PROBIOTIC PO) Take 1 capsule by mouth daily.    . Semaglutide,0.25 or 0.5MG /DOS, (OZEMPIC, 0.25 OR 0.5 MG/DOSE,) 2 MG/1.5ML SOPN Inject 0.5 mg into the skin  once a week. 2 pen 1  . albuterol (PROVENTIL) (2.5 MG/3ML) 0.083% nebulizer solution Take 3 mLs (2.5 mg total) by nebulization every 4 (four) hours as needed for wheezing or shortness of breath (Dx: J45.50). (Patient not taking: Reported on 06/24/2019) 75 mL 1   No facility-administered medications prior to visit.    Allergies  Allergen Reactions  . Adhesive [Tape] Other (See Comments)    Takes patient's skin off.  . Banana Diarrhea and Nausea Only  . Eggs Or Egg-Derived Products Diarrhea and Nausea Only  . Latex Other (See Comments)    sensativity  . Penicillins Rash and Other (See Comments)    Has had cephalosporins without incident  . Pine Swelling and Rash  . Rose Swelling and Rash    ROS As per HPI  PE: Resp. rate 16, height 5\' 1"  (1.549 m), weight 270 lb 9.6 oz (122.7 kg), last menstrual period 08/10/2010. Gen: Alert, well appearing.  Patient is oriented to person, place, time, and situation. AFFECT: pleasant, lucid thought and speech. L glut inner aspect with 1 cm shallow "punched out" ulcerated area of skin about 1 cm in size--oval. No exudate.  Mild TTP.  Minimal violaceous-color to skin directly around the focal ulceration.  No erythema. No nodularity or fluctuance.  LABS:    Chemistry      Component Value Date/Time   NA 140 01/21/2019 1452   K 3.6 01/21/2019 1452   CL 102 01/21/2019 1452   CO2 27 01/21/2019 1452   BUN 24 (H) 01/21/2019 1452   CREATININE 0.79 01/21/2019 1452   CREATININE 0.92  07/17/2018 1028      Component Value Date/Time   CALCIUM 9.3 01/21/2019 1452   ALKPHOS 66 10/05/2018 1040   AST 20 10/05/2018 1040   AST 38 07/17/2018 1028   ALT 15 10/05/2018 1040   ALT 28 07/17/2018 1028   BILITOT 0.4 10/05/2018 1040   BILITOT 0.4 07/17/2018 1028     Lab Results  Component Value Date   WBC 10.1 01/21/2019   HGB 10.3 (L) 01/21/2019   HCT 31.5 (L) 01/21/2019   MCV 96.1 01/21/2019   PLT 223.0 01/21/2019   Lab Results  Component Value Date   TSH 1.88 10/05/2018   Lab Results  Component Value Date   CHOL 136 10/05/2018   HDL 27.10 (L) 10/05/2018   LDLCALC 75 02/13/2017   LDLDIRECT 78.0 10/05/2018   TRIG 232.0 (H) 10/05/2018   CHOLHDL 5 10/05/2018    IMPRESSION AND PLAN:  R gluteal wound of unknown etiology (?pressure ulcer, ? Deep abrasion from low impact fall about 2 and 1/2 mo ago?).  She is s/p a course of keflex (when I saw her initially for this 06/24/19) and has been applying bactroban and trying to offload the area.  She says that any adhesive (duoderm, etc) wound cause a rash so she is not using anything like this. We'll give it 1 more week with current treatment and if not improving more at that f/u then we discussed getting her to the wound clinic for expert evaluation.  An After Visit Summary was printed and given to the patient.  FOLLOW UP:  1 wk  Also has appt with me already set for f/u chronic pain syndrome 07/19/19.  Signed:  Crissie Sickles, MD           07/08/2019

## 2019-07-10 ENCOUNTER — Telehealth: Payer: Self-pay

## 2019-07-10 NOTE — Telephone Encounter (Signed)
Patient was prescribed an ointment 2 weeks ago by Dr. Anitra Lauth.  She says for the last 2 days it has been burning. Is this a reaction?  Should she stop taking and try something else?  Please advise. [phone]

## 2019-07-10 NOTE — Telephone Encounter (Signed)
Sent as FYI, but if other recommendations please advise.  Pt has d/c bactroban ointment and used neosporin instead which seems to be helping.

## 2019-07-10 NOTE — Telephone Encounter (Signed)
Stop bactroban ointment.

## 2019-07-10 NOTE — Telephone Encounter (Signed)
Patient's last visit was 07/08/19 for f/u wound, pressure ulcer. Advised to continue using bactroban 2% ointment originally started on 06/24/19(15g,1). It is now causing burning when applying.   Please advise, thanks. Pt is due for next f/u, 07/15/19.

## 2019-07-10 NOTE — Telephone Encounter (Signed)
OK, no additional recommendations.

## 2019-07-11 NOTE — Telephone Encounter (Signed)
Nothing further needed 

## 2019-07-15 ENCOUNTER — Ambulatory Visit: Payer: Medicare Other | Admitting: Family Medicine

## 2019-07-19 ENCOUNTER — Encounter: Payer: Self-pay | Admitting: Family Medicine

## 2019-07-19 ENCOUNTER — Other Ambulatory Visit: Payer: Self-pay

## 2019-07-19 ENCOUNTER — Ambulatory Visit (INDEPENDENT_AMBULATORY_CARE_PROVIDER_SITE_OTHER): Payer: Medicare Other | Admitting: Family Medicine

## 2019-07-19 VITALS — BP 139/66 | HR 88 | Temp 97.9°F | Resp 16 | Wt 260.0 lb

## 2019-07-19 DIAGNOSIS — G620 Drug-induced polyneuropathy: Secondary | ICD-10-CM

## 2019-07-19 DIAGNOSIS — T451X5A Adverse effect of antineoplastic and immunosuppressive drugs, initial encounter: Secondary | ICD-10-CM | POA: Diagnosis not present

## 2019-07-19 DIAGNOSIS — M159 Polyosteoarthritis, unspecified: Secondary | ICD-10-CM

## 2019-07-19 DIAGNOSIS — G894 Chronic pain syndrome: Secondary | ICD-10-CM

## 2019-07-19 DIAGNOSIS — L98491 Non-pressure chronic ulcer of skin of other sites limited to breakdown of skin: Secondary | ICD-10-CM

## 2019-07-19 DIAGNOSIS — M8949 Other hypertrophic osteoarthropathy, multiple sites: Secondary | ICD-10-CM | POA: Diagnosis not present

## 2019-07-19 MED ORDER — HYDROCODONE-ACETAMINOPHEN 5-325 MG PO TABS: ORAL_TABLET | ORAL | 0 refills | Status: DC

## 2019-07-19 MED ORDER — DICLOFENAC SODIUM 1 % EX GEL: 2.0000 g | Freq: Four times a day (QID) | CUTANEOUS | 3 refills | Status: AC

## 2019-07-19 NOTE — Progress Notes (Signed)
Virtual Visit via Video Note  I connected with [name] on 07/19/19 at  1:00 PM EDT by a video enabled telemedicine application and verified that I am speaking with the correct person using two identifiers.  Location patient: home Location provider:work or home office Persons participating in the virtual visit: patient, provider  I discussed the limitations of evaluation and management by telemedicine and the availability of in person appointments. The patient expressed understanding and agreed to proceed.  Telemedicine visit is a necessity given the COVID-19 restrictions in place at the current time.  HPI: 69 y/o WF being seen today for f/u chronic pain syndrome.  A/P as of last visit: "1) Chronic pain syndrome: osteoarthritis of multiple sites, +chemotherapy-induced PN. Stable.  The current medical regimen is effective;  continue present plan and medications. I did electronic rx's for vicodin 5/325, 1-2 bid prn, #90  today for this month, Feb 2021, and Mar 2021.  Appropriate fill on/after date was noted on each rx. CSC UTD.  2) DM 2, control historically up and down. She has initial appt with endocrinologist at the end of this month. No labs today.  No insulin dosing changes today.  3) HTN: The current medical regimen is effective;  continue present plan and medications.  4) Colon ca screening: adenoma x 1 on initial screening 2014.  Needs repeat when [name] feels like she feels well enough (and safe enough with regard to covid 19 pandemic) --Dr. Collene Mares."  Interim hx: Doing ok. Of note, for her small pressure sore on buttocks she started using a duoderm-like wound application that has silver in it and is latex-free and it has helped well over the last 5d and she tolerates it well.  Indication for chronic opioid:osteoarthritis in hands, low back, feet, L hip, knees, +chemotherapy -induced neuropathy (burning and numbness in feet). Medication and dose:Vicodin 5/325, 1-2 bid prn # pills per  month: 90 PMP AWARE reviewed today: most recent rx for vicodin 5/325 was filled 06/14/19, # 66, rx by me. No red flags.  Takes two vicodin every night. Avoids vicodin in daytime b/c doesn't want to potentially be impaired while driving, etc. Takes 2 advil daytime. R hand fingers seem to be her worst areas of arthritis pain. Icy hot helps some. Rest helps/changing activities that use her hands a lot is helpful.  Occ gets sciatica pain and says only resting/sitting helps it. Has tried otc voltaren in the past and recalls it helping some. No signif adverse side effects from her opioid.  Past Medical History:  Diagnosis Date  . Allergic rhinitis    Allergy testing: results pending as of 05/28/16 (Dr. Harold Hedge)  . Anemia   . Arthritis   . Chemotherapy-induced neuropathy ([place]) 2019  . Cough variant asthma   . DDD (degenerative disc disease), lumbar   . Diabetes mellitus with complication (HCC)    Mild nonprolif DR R eye 12/2017-->referred to retinal specialist DM MANAGED BY DR. GHERGHE AS OF 2021  . Endometrial cancer ([place]) 02/2018   REMISSION AS OF 08/2018.  Stage III (T3, Nx, Mx)  Path: endometroid adenocarcinoma involving cervix, uterus, and fallopian tubes ([name] got TAH & BSO 02/2018). No sign of recurrence as of onc f/u 05/2019.  Marland Kitchen GERD (gastroesophageal reflux disease)   . Hemorrhoids   . Herpes zoster 10/2015   L side belt-line  . History of adenomatous polyp of colon 02/25/13   5 mm cecal polyp removed by Dr. Trevor Mace 5 yrs  . History of blood transfusion   .  History of cellulitis 08/2010   Left (Since replacemnt of Left knee  . Hyperkalemia 03/2018   started after [name] started getting chemo-->had to stop enalapril.  Marland Kitchen Hyperlipidemia    Not on statin b/c lipids "stayed down when sugars came down" per [name].  She says Dr. Chalmers Cater knows she is not on statin anymore.  . Hypertension   . Hypothyroidism   . Nephrolithiasis 4/07  . Obesity   . OSA on CPAP   . Osteoarthritis    knees,  ankle, + ? scapholunate ligament disruption (x-ray 06/2017)--ortho referral.  . Pancytopenia due to antineoplastic chemotherapy ([place])    progressive as of 06/2018. Stable 08/2018.  Marland Kitchen Recurrent UTI    started cipro 250 qd 09/2018  . Sciatica of left side   . Severe persistent asthma    cough-variant--saw Allergist 05/25/16 and was switched from max dose advair to symbicort.  . Systolic murmur    ECHO fine 10/2016-->murmur likely flow murmur assoc with HTN.  Marland Kitchen Zoon's vulvitis    Bx-proven (GYN) -lichen sclerosis.  Clobetasol 0.05% ointment per GYN    Past Surgical History:  Procedure Laterality Date  . ANKLE FRACTURE SURGERY  1984   Pin & repair  . ARTHROSCOPIC REPAIR ACL  2000   Due to ACL tear  . ARTHROSCOPIC REPAIR ACL  5/08  . Blateral knee rerlacements x4 2 times each knee    . BREAST BIOPSY Right 2014   Benign  . CARDIAC CATHETERIZATION  5/07   clear vessel mild mitral   . CARPAL TUNNEL RELEASE Right [id number]   1989 Left  . CESAREAN SECTION  1985  . CHOLECYSTECTOMY OPEN  1988  . COLONOSCOPY N/A 02/25/2013   Tubular adenoma x 1: Recall 5 yrs. Procedure: COLONOSCOPY;  Surgeon: Juanita Craver, MD;  Location: [facility];  Service: Endoscopy;  Laterality: N/A;  . COMBINED HYSTEROSCOPY DIAGNOSTIC / D&C  2/12   Bx neg  . DEXA  08/2007   Bone density normal.  . DILATION AND CURETTAGE OF UTERUS  12/11  . IR IMAGING GUIDED PORT INSERTION  03/16/2018  . IR REMOVAL TUN ACCESS W/ PORT W/O FL MOD SED  09/20/2018  . LYMPH NODE BIOPSY N/A 02/19/2018   Procedure: Sentinel LYMPH NODE BIOPSY;  Surgeon: Everitt Amber, MD;  Location: [facility];  Service: Gynecology;  Laterality: N/A;  . REPLACEMENT TOTAL KNEE Left 5/12   X 2 on each  . ROBOTIC ASSISTED TOTAL HYSTERECTOMY WITH BILATERAL SALPINGO OOPHERECTOMY N/A 02/19/2018   For endometrial cancer.  Procedure: XI ROBOTIC ASSISTED TOTAL HYSTERECTOMY WITH BILATERAL SALPINGO OOPHORECTOMY;  Surgeon: Everitt Amber, MD;  Location: [place];  Service: Gynecology;  Laterality: N/A;  . TRANSTHORACIC ECHOCARDIOGRAM  10/11/2016    EF 55-60%, grd I DD.  . TUBAL LIGATION  1985   C-Section    Family History  Problem Relation Age of Onset  . Diabetes Father   . COPD Father   . Stroke Father   . Celiac disease Brother   . Rheum arthritis Brother   . Thyroid disease Brother   . Diabetes Brother   . Alzheimer's disease Mother   . Arthritis Mother   . Other Son        Died age 15 -Tetrology of Fallot - VSD/pulmonary atresia  . Breast cancer Other        postmenopausal when diagnosed     Current Outpatient Medications:  .  albuterol (VENTOLIN HFA) 108 (90 Base) MCG/ACT inhaler, INHALE 2 PUFFS  INTO LUNGS EVERY 6 HOURS AS NEEDED FOR WHEEZING/SHORTNESS OF BREATH, Disp: 18 g, Rfl: 2 .  budesonide-formoterol (SYMBICORT) 160-4.5 MCG/ACT inhaler, TAKE 2 PUFFS BY MOUTH TWICE A DAY, Disp: 10.2 Inhaler, Rfl: 11 .  clobetasol ointment (TEMOVATE) [id number] %, APPLY 1 APPLICATION TOPICALLY AT BEDTIME. APPLY TO THE SKIN OF THE VULVA FOR 12 WEEKS, Disp: 30 g, Rfl: 1 .  felodipine (PLENDIL) 10 MG 24 hr tablet, TAKE 1 TABLET BY MOUTH EVERY DAY, Disp: 90 tablet, Rfl: 1 .  fluticasone (FLONASE SENSIMIST) 27.5 MCG/SPRAY nasal spray, Place 1 spray into the nose daily as needed for rhinitis., Disp: , Rfl:  .  furosemide (LASIX) 20 MG tablet, TAKE 1 TABLET BY MOUTH EVERY DAY, Disp: 90 tablet, Rfl: 1 .  HYDROcodone-acetaminophen (NORCO/VICODIN) 5-325 MG tablet, 1-2 tabs po bid prn pain, Disp: 90 tablet, Rfl: 0 .  insulin aspart (NOVOLOG) 100 UNIT/ML injection, Inject under skin 10-14 UNITS 3 TIMES DAILY, Disp: 50 mL, Rfl: 2 .  insulin NPH Human (NOVOLIN N) 100 UNIT/ML injection, Inject 30 units 3x a day under skin, Disp: 50 mL, Rfl: 5 .  Insulin Syringes, Disposable, U-100 0.3 ML MISC, Use to inject insulin--6 injections per day, Disp: 540 each, Rfl: 3 .  levocetirizine (XYZAL) 5 MG tablet, Take 5 mg by mouth at bedtime as needed for  allergies. , Disp: , Rfl: 5 .  levothyroxine (SYNTHROID) 125 MCG tablet, 1 tab po qd, Disp: 90 tablet, Rfl: 3 .  metFORMIN (GLUCOPHAGE) 500 MG tablet, TAKE 1 TABLET BY MOUTH EVERY MORNING AND 2 TABLETS BY MOUTH AT BEDTIME., Disp: 270 tablet, Rfl: 1 .  pravastatin (PRAVACHOL) 20 MG tablet, Take 1 tablet (20 mg total) by mouth daily., Disp: 90 tablet, Rfl: 0 .  Probiotic Product (PROBIOTIC PO), Take 1 capsule by mouth daily., Disp: , Rfl:  .  Semaglutide,0.25 or 0.5MG /DOS, (OZEMPIC, 0.25 OR 0.5 MG/DOSE,) 2 MG/1.5ML SOPN, Inject 0.5 mg into the skin once a week., Disp: 2 pen, Rfl: 1 .  albuterol (PROVENTIL) (2.5 MG/3ML) 0.083% nebulizer solution, Take 3 mLs (2.5 mg total) by nebulization every 4 (four) hours as needed for wheezing or shortness of breath (Dx: J45.50). (Patient not taking: Reported on 06/24/2019), Disp: 75 mL, Rfl: 1 .  fluticasone (CUTIVATE) 0.05 % cream, APPLY TO AFFECTED AREA OF R LOWER LEG TWICE PER DAY. (Patient not taking: Reported on 07/19/2019), Disp: 30 g, Rfl: 0 .  meloxicam (MOBIC) 15 MG tablet, TAKE 1 TABLET BY MOUTH EVERY DAY AS NEEDED (Patient not taking: Reported on 07/19/2019), Disp: 30 tablet, Rfl: 3 .  miconazole (MICRO GUARD) 2 % powder, Apply 1 application topically 2 (two) times daily as needed for itching., Disp: , Rfl:  .  mupirocin ointment (BACTROBAN) 2 %, Apply 1 application topically 3 (three) times daily. (Patient not taking: Reported on 07/19/2019), Disp: 15 g, Rfl: 1  EXAM:  VITALS per patient if applicable: BP 123456 (BP Location: Left Arm, Patient Position: Sitting, Cuff Size: Large)   Pulse 88   Temp 97.9 F (36.6 C) (Temporal)   Resp 16   Wt 260 lb (117.9 kg)   LMP 08/10/2010 Comment: Hysterectomy 02/02/18  SpO2 93%   BMI 49.13 kg/m    GENERAL: alert, oriented, appears well and in no acute distress  HEENT: atraumatic, conjunttiva clear, no obvious abnormalities on inspection of external nose and ears  NECK: normal movements of the head and  neck  LUNGS: on inspection no signs of respiratory distress, breathing rate appears normal, no obvious  gross SOB, gasping or wheezing  CV: no obvious cyanosis  MS: moves all visible extremities without noticeable abnormality  PSYCH/NEURO: pleasant and cooperative, no obvious depression or anxiety, speech and thought processing grossly intact  LABS: none today  Lab Results  Component Value Date   HGBA1C 6.6 (A) 05/07/2019     Chemistry      Component Value Date/Time   NA 140 01/21/2019 1452   K 3.6 01/21/2019 1452   CL 102 01/21/2019 1452   CO2 27 01/21/2019 1452   BUN 24 (H) 01/21/2019 1452   CREATININE 0.79 01/21/2019 1452   CREATININE 0.92 07/17/2018 1028      Component Value Date/Time   CALCIUM 9.3 01/21/2019 1452   ALKPHOS 66 10/05/2018 1040   AST 20 10/05/2018 1040   AST 38 07/17/2018 1028   ALT 15 10/05/2018 1040   ALT 28 07/17/2018 1028   BILITOT 0.4 10/05/2018 1040   BILITOT 0.4 07/17/2018 1028     Lab Results  Component Value Date   WBC 10.1 01/21/2019   HGB 10.3 (L) 01/21/2019   HCT 31.5 (L) 01/21/2019   MCV 96.1 01/21/2019   PLT 223.0 01/21/2019   Lab Results  Component Value Date   CHOL 136 10/05/2018   HDL 27.10 (L) 10/05/2018   LDLCALC 75 02/13/2017   LDLDIRECT 78.0 10/05/2018   TRIG 232.0 (H) 10/05/2018   CHOLHDL 5 10/05/2018   Lab Results  Component Value Date   TSH 1.88 10/05/2018   ASSESSMENT AND PLAN:  Discussed the following assessment and plan:  1) Chronic pain syndrome: osteoarthritis multiple sites, chemo-induced PN. The current medical regimen is effective, but we'll also add voltaren gel for use on hands and knees. I did electronic rx for vicodin 5/325, 1-2 bid prn, #90 today.  Appropriate fill on/after date was noted on each rx.  2) Gluteal skin (pressure?) ulcer->she is finding the otc colloid dressing she got on Amazone helpful and tolerable (see hpi)--Mepilex HE.     I discussed the assessment and treatment plan with  the patient. The patient was provided an opportunity to ask questions and all were answered. The patient agreed with the plan and demonstrated an understanding of the instructions.   The patient was advised to call back or seek an in-person evaluation if the symptoms worsen or if the condition fails to improve as anticipated.  F/u: 3 mo chronic pain, has f/u visit to recheck her buttocks wound with me in 3d.  Signed:  Crissie Sickles, MD           07/19/2019

## 2019-07-22 ENCOUNTER — Encounter: Payer: Self-pay | Admitting: Family Medicine

## 2019-07-22 ENCOUNTER — Ambulatory Visit (INDEPENDENT_AMBULATORY_CARE_PROVIDER_SITE_OTHER): Payer: Medicare Other | Admitting: Family Medicine

## 2019-07-22 ENCOUNTER — Other Ambulatory Visit: Payer: Self-pay

## 2019-07-22 VITALS — BP 139/73 | HR 93 | Temp 98.2°F | Resp 16 | Ht 61.0 in | Wt 262.0 lb

## 2019-07-22 DIAGNOSIS — L89312 Pressure ulcer of right buttock, stage 2: Secondary | ICD-10-CM

## 2019-07-22 NOTE — Progress Notes (Signed)
OFFICE VISIT  07/22/2019   CC:  Chief Complaint  Patient presents with  . Follow-up    gluteal wound   HPI:    Patient is a 70 y.o. Caucasian female who presents for f/u buttock/gluteal wound. She started using a duoderm-like wound application that has silver in it and is latex-free and it has helped well over the last 7d and she tolerates it well. Not hurting as much.  No fevers, no wound drainage, no malaise.  Past Medical History:  Diagnosis Date  . Allergic rhinitis    Allergy testing: results pending as of 05/28/16 (Dr. Harold Hedge)  . Anemia   . Arthritis   . Chemotherapy-induced neuropathy ([place]) 2019  . Cough variant asthma   . DDD (degenerative disc disease), lumbar   . Diabetes mellitus with complication (HCC)    Mild nonprolif DR R eye 12/2017-->referred to retinal specialist DM MANAGED BY DR. GHERGHE AS OF 2021  . Endometrial cancer ([place]) 02/2018   REMISSION AS OF 08/2018.  Stage III (T3, Nx, Mx)  Path: endometroid adenocarcinoma involving cervix, uterus, and fallopian tubes (Pt got TAH & BSO 02/2018). No sign of recurrence as of onc f/u 05/2019.  Marland Kitchen GERD (gastroesophageal reflux disease)   . Hemorrhoids   . Herpes zoster 10/2015   L side belt-line  . History of adenomatous polyp of colon 02/25/13   5 mm cecal polyp removed by Dr. Trevor Mace 5 yrs  . History of blood transfusion   . History of cellulitis 08/2010   Left (Since replacemnt of Left knee  . Hyperkalemia 03/2018   started after pt started getting chemo-->had to stop enalapril.  Marland Kitchen Hyperlipidemia    Not on statin b/c lipids "stayed down when sugars came down" per pt.  She says Dr. Chalmers Cater knows she is not on statin anymore.  . Hypertension   . Hypothyroidism   . Nephrolithiasis 4/07  . Obesity   . OSA on CPAP   . Osteoarthritis    knees, ankle, + ? scapholunate ligament disruption (x-ray 06/2017)--ortho referral.  . Pancytopenia due to antineoplastic chemotherapy ([place])    progressive as of 06/2018. Stable  08/2018.  Marland Kitchen Recurrent UTI    started cipro 250 qd 09/2018  . Sciatica of left side   . Severe persistent asthma    cough-variant--saw Allergist 05/25/16 and was switched from max dose advair to symbicort.  . Systolic murmur    ECHO fine 10/2016-->murmur likely flow murmur assoc with HTN.  Marland Kitchen Zoon's vulvitis    Bx-proven (GYN) -lichen sclerosis.  Clobetasol 0.05% ointment per GYN    Past Surgical History:  Procedure Laterality Date  . ANKLE FRACTURE SURGERY  1984   Pin & repair  . ARTHROSCOPIC REPAIR ACL  2000   Due to ACL tear  . ARTHROSCOPIC REPAIR ACL  5/08  . Blateral knee rerlacements x4 2 times each knee    . BREAST BIOPSY Right 2014   Benign  . CARDIAC CATHETERIZATION  5/07   clear vessel mild mitral   . CARPAL TUNNEL RELEASE Right [id number]   1989 Left  . CESAREAN SECTION  1985  . CHOLECYSTECTOMY OPEN  1988  . COLONOSCOPY N/A 02/25/2013   Tubular adenoma x 1: Recall 5 yrs. Procedure: COLONOSCOPY;  Surgeon: Juanita Craver, MD;  Location: [facility];  Service: Endoscopy;  Laterality: N/A;  . COMBINED HYSTEROSCOPY DIAGNOSTIC / D&C  2/12   Bx neg  . DEXA  08/2007   Bone density normal.  . DILATION AND CURETTAGE OF  UTERUS  12/11  . IR IMAGING GUIDED PORT INSERTION  03/16/2018  . IR REMOVAL TUN ACCESS W/ PORT W/O FL MOD SED  09/20/2018  . LYMPH NODE BIOPSY N/A 02/19/2018   Procedure: Sentinel LYMPH NODE BIOPSY;  Surgeon: Everitt Amber, MD;  Location: [facility];  Service: Gynecology;  Laterality: N/A;  . REPLACEMENT TOTAL KNEE Left 5/12   X 2 on each  . ROBOTIC ASSISTED TOTAL HYSTERECTOMY WITH BILATERAL SALPINGO OOPHERECTOMY N/A 02/19/2018   For endometrial cancer.  Procedure: XI ROBOTIC ASSISTED TOTAL HYSTERECTOMY WITH BILATERAL SALPINGO OOPHORECTOMY;  Surgeon: Everitt Amber, MD;  Location: [place];  Service: Gynecology;  Laterality: N/A;  . TRANSTHORACIC ECHOCARDIOGRAM  10/11/2016    EF 55-60%, grd I DD.  . TUBAL LIGATION  1985   C-Section     Outpatient Medications Prior to Visit  Medication Sig Dispense Refill  . albuterol (VENTOLIN HFA) 108 (90 Base) MCG/ACT inhaler INHALE 2 PUFFS INTO LUNGS EVERY 6 HOURS AS NEEDED FOR WHEEZING/SHORTNESS OF BREATH 18 g 2  . budesonide-formoterol (SYMBICORT) 160-4.5 MCG/ACT inhaler TAKE 2 PUFFS BY MOUTH TWICE A DAY 10.2 Inhaler 11  . clobetasol ointment (TEMOVATE) [id number] % APPLY 1 APPLICATION TOPICALLY AT BEDTIME. APPLY TO THE SKIN OF THE VULVA FOR 12 WEEKS 30 g 1  . diclofenac Sodium (VOLTAREN) 1 % GEL Apply 2 g topically 4 (four) times daily. 100 g 3  . felodipine (PLENDIL) 10 MG 24 hr tablet TAKE 1 TABLET BY MOUTH EVERY DAY 90 tablet 1  . fluticasone (FLONASE SENSIMIST) 27.5 MCG/SPRAY nasal spray Place 1 spray into the nose daily as needed for rhinitis.    . furosemide (LASIX) 20 MG tablet TAKE 1 TABLET BY MOUTH EVERY DAY 90 tablet 1  . HYDROcodone-acetaminophen (NORCO/VICODIN) 5-325 MG tablet 1-2 tabs po bid prn pain 90 tablet 0  . insulin aspart (NOVOLOG) 100 UNIT/ML injection Inject under skin 10-14 UNITS 3 TIMES DAILY 50 mL 2  . insulin NPH Human (NOVOLIN N) 100 UNIT/ML injection Inject 30 units 3x a day under skin 50 mL 5  . Insulin Syringes, Disposable, U-100 0.3 ML MISC Use to inject insulin--6 injections per day 540 each 3  . levocetirizine (XYZAL) 5 MG tablet Take 5 mg by mouth at bedtime as needed for allergies.   5  . levothyroxine (SYNTHROID) 125 MCG tablet 1 tab po qd 90 tablet 3  . metFORMIN (GLUCOPHAGE) 500 MG tablet TAKE 1 TABLET BY MOUTH EVERY MORNING AND 2 TABLETS BY MOUTH AT BEDTIME. 270 tablet 1  . miconazole (MICRO GUARD) 2 % powder Apply 1 application topically 2 (two) times daily as needed for itching.    . pravastatin (PRAVACHOL) 20 MG tablet Take 1 tablet (20 mg total) by mouth daily. 90 tablet 0  . Probiotic Product (PROBIOTIC PO) Take 1 capsule by mouth daily.    . Semaglutide,0.25 or 0.5MG /DOS, (OZEMPIC, 0.25 OR 0.5 MG/DOSE,) 2 MG/1.5ML SOPN Inject 0.5 mg into the  skin once a week. 2 pen 1  . Wound Dressings (MEPILEX EX) Apply topically.    Marland Kitchen albuterol (PROVENTIL) (2.5 MG/3ML) 0.083% nebulizer solution Take 3 mLs (2.5 mg total) by nebulization every 4 (four) hours as needed for wheezing or shortness of breath (Dx: J45.50). (Patient not taking: Reported on 06/24/2019) 75 mL 1  . fluticasone (CUTIVATE) 0.05 % cream APPLY TO AFFECTED AREA OF R LOWER LEG TWICE PER DAY. (Patient not taking: Reported on 07/19/2019) 30 g 0  . meloxicam (MOBIC) 15 MG tablet TAKE  1 TABLET BY MOUTH EVERY DAY AS NEEDED (Patient not taking: Reported on 07/19/2019) 30 tablet 3  . mupirocin ointment (BACTROBAN) 2 % Apply 1 application topically 3 (three) times daily. (Patient not taking: Reported on 07/19/2019) 15 g 1   No facility-administered medications prior to visit.    Allergies  Allergen Reactions  . Adhesive [Tape] Other (See Comments)    Takes patient's skin off.  . Banana Diarrhea and Nausea Only  . Eggs Or Egg-Derived Products Diarrhea and Nausea Only  . Latex Other (See Comments)    sensativity  . Penicillins Rash and Other (See Comments)    Has had cephalosporins without incident  . Pine Swelling and Rash  . Rose Swelling and Rash    ROS As per HPI  PE: Blood pressure 139/73, pulse 93, temperature 98.2 F (36.8 C), temperature source Temporal, resp. rate 16, height 5\' 1"  (1.549 m), weight 262 lb (118.8 kg), last menstrual period 08/10/2010, SpO2 97 %.  Exam chaperoned by Deveron Furlong, CMA.  Gen: Alert, well appearing.  Patient is oriented to person, place, time, and situation. AFFECT: pleasant, lucid thought and speech. L glut medial aspect with approx 3-4 mm oval "punched out" type of shallow ulceration.  Approx 1 mm of depth. No erythema, exudate, or tenderness.  No subQ nodularity or fluctuance.   LABS:    Chemistry      Component Value Date/Time   NA 140 01/21/2019 1452   K 3.6 01/21/2019 1452   CL 102 01/21/2019 1452   CO2 27 01/21/2019 1452    BUN 24 (H) 01/21/2019 1452   CREATININE 0.79 01/21/2019 1452   CREATININE 0.92 07/17/2018 1028      Component Value Date/Time   CALCIUM 9.3 01/21/2019 1452   ALKPHOS 66 10/05/2018 1040   AST 20 10/05/2018 1040   AST 38 07/17/2018 1028   ALT 15 10/05/2018 1040   ALT 28 07/17/2018 1028   BILITOT 0.4 10/05/2018 1040   BILITOT 0.4 07/17/2018 1028     Lab Results  Component Value Date   HGBA1C 6.6 (A) 05/07/2019    IMPRESSION AND PLAN:  L medial glut shallow pressure ulcer. Very gradual improvement. She is tolerating the topical "duoderm-like" dressing well. No sign of infxn. Continue current treatment and we can see her back on prn basis: if any increase in pain, any redness, any d/c or any other questions about it then she knows to call or return.  An After Visit Summary was printed and given to the patient.  FOLLOW UP: Return if symptoms worsen or fail to improve.  Signed:  Crissie Sickles, MD           07/22/2019

## 2019-07-29 DIAGNOSIS — Z23 Encounter for immunization: Secondary | ICD-10-CM | POA: Diagnosis not present

## 2019-07-30 ENCOUNTER — Other Ambulatory Visit: Payer: Self-pay | Admitting: Internal Medicine

## 2019-08-05 ENCOUNTER — Ambulatory Visit: Payer: Medicare Other | Admitting: Internal Medicine

## 2019-08-07 ENCOUNTER — Other Ambulatory Visit: Payer: Self-pay | Admitting: Family Medicine

## 2019-08-07 NOTE — Telephone Encounter (Signed)
RF sent.

## 2019-08-20 ENCOUNTER — Other Ambulatory Visit: Payer: Self-pay

## 2019-08-20 DIAGNOSIS — N904 Leukoplakia of vulva: Secondary | ICD-10-CM

## 2019-08-20 MED ORDER — CLOBETASOL PROPIONATE 0.05 % EX OINT: 1.0000 "application " | TOPICAL_OINTMENT | Freq: Every day | CUTANEOUS | 2 refills | Status: DC

## 2019-08-23 NOTE — Progress Notes (Signed)
Patient presents today for a 6 month f/u visit with Dr. Sondra Come.  Pain: 8 in hands feet and back  Fatigue: moderate to severe  Nausea,vomiting/diarrhea? Residual diarrhea from Ozempic.  Bladder issues: leakage.  Vaginal or rectal bleeding: none  Skin irritation: No

## 2019-08-26 ENCOUNTER — Encounter: Payer: Self-pay | Admitting: Radiation Oncology

## 2019-08-26 ENCOUNTER — Other Ambulatory Visit: Payer: Self-pay

## 2019-08-26 ENCOUNTER — Ambulatory Visit
Admission: RE | Admit: 2019-08-26 | Discharge: 2019-08-26 | Disposition: A | Discharge status: Home / Self Care | Payer: Medicare Other | Source: Ambulatory Visit | Attending: Radiation Oncology | Admitting: Radiation Oncology

## 2019-08-26 VITALS — BP 158/56 | HR 94 | Temp 98.5°F | Resp 20 | Ht 61.0 in | Wt 267.8 lb

## 2019-08-26 DIAGNOSIS — Z923 Personal history of irradiation: Secondary | ICD-10-CM | POA: Diagnosis not present

## 2019-08-26 DIAGNOSIS — Z8542 Personal history of malignant neoplasm of other parts of uterus: Secondary | ICD-10-CM | POA: Insufficient documentation

## 2019-08-26 DIAGNOSIS — C541 Malignant neoplasm of endometrium: Secondary | ICD-10-CM

## 2019-08-26 DIAGNOSIS — R197 Diarrhea, unspecified: Secondary | ICD-10-CM | POA: Diagnosis not present

## 2019-08-26 DIAGNOSIS — Z08 Encounter for follow-up examination after completed treatment for malignant neoplasm: Secondary | ICD-10-CM | POA: Diagnosis not present

## 2019-08-26 DIAGNOSIS — R5383 Other fatigue: Secondary | ICD-10-CM | POA: Diagnosis not present

## 2019-08-26 DIAGNOSIS — R32 Unspecified urinary incontinence: Secondary | ICD-10-CM | POA: Insufficient documentation

## 2019-08-26 DIAGNOSIS — Z79899 Other long term (current) drug therapy: Secondary | ICD-10-CM | POA: Diagnosis not present

## 2019-08-26 NOTE — Progress Notes (Signed)
Radiation Oncology         [phone] ________________________________  Name: [name] MRN: [id number]  Date: 08/26/2019  DOB: 01-08-1951  Follow-Up Visit Note  CC: McGowen, Adrian Blackwater, MD  Everitt Amber, MD    ICD-10-CM   1. Endometrial cancer (HCC)  C54.1     Diagnosis: Stage IIIA endometroid endometrial carcinoma      Interval Since Last Radiation: One year, three months, and one week.  Radiation treatment dates: 04/24/2018, 05/03/2018, 05/07/2018, 05/16/2018, 05/21/2018  Site/dose:  Vaginal cuff, 6 Gy in 5 fractions for a total dose of 30 Gy  Narrative:  The patient returns today for routine follow-up. She is doing well overall. Since her last follow-up, she underwent a bilateral screening mammogram on 03/05/2019 that did not show any mammographic evidence of malignancy.  She followed up with Dr. Denman George on 05/20/2019. There was no evidence of recurrence at that time.  On review of systems, she reports moderate to severe fatigue, pain in hands, feet, and back, residual diarrhea from Ozempic, and urinary incontinence. She denies vaginal bleeding, rectal bleeding, and skin irritation.  Urinary incontinence has been a chronic issue for her for many years.  Patient is feeling better after stopping her Ozempic.  She had severe diarrhea with this medication.  She denies any problems with abdominal bloating or pelvic pain.  She completed her COVID-19 vaccinations approximately 1 month ago.  Patient reports having Moderna vaccine                 ALLERGIES:  is allergic to adhesive [tape]; banana; eggs or egg-derived products; latex; penicillins; pine; and rose.  Meds: Current Outpatient Medications  Medication Sig Dispense Refill  . albuterol (PROVENTIL) (2.5 MG/3ML) 0.083% nebulizer solution Take 3 mLs (2.5 mg total) by nebulization every 4 (four) hours as needed for wheezing or shortness of breath (Dx: J45.50). 75 mL 1  . albuterol (VENTOLIN HFA) 108 (90 Base) MCG/ACT inhaler  INHALE 2 PUFFS INTO LUNGS EVERY 6 HOURS AS NEEDED FOR WHEEZING/SHORTNESS OF BREATH 18 g 2  . budesonide-formoterol (SYMBICORT) 160-4.5 MCG/ACT inhaler TAKE 2 PUFFS BY MOUTH TWICE A DAY 10.2 Inhaler 11  . clobetasol ointment (TEMOVATE) [id number] % Apply 1 application topically at bedtime. Apply to the skin of the vulva for 12 weeks 30 g 2  . diclofenac Sodium (VOLTAREN) 1 % GEL Apply 2 g topically 4 (four) times daily. 100 g 3  . felodipine (PLENDIL) 10 MG 24 hr tablet TAKE 1 TABLET BY MOUTH EVERY DAY 90 tablet 1  . fluticasone (CUTIVATE) 0.05 % cream APPLY TO AFFECTED AREA OF R LOWER LEG TWICE PER DAY. 30 g 0  . fluticasone (FLONASE SENSIMIST) 27.5 MCG/SPRAY nasal spray Place 1 spray into the nose daily as needed for rhinitis.    . furosemide (LASIX) 20 MG tablet TAKE 1 TABLET BY MOUTH EVERY DAY 90 tablet 1  . HYDROcodone-acetaminophen (NORCO/VICODIN) 5-325 MG tablet 1-2 tabs po bid prn pain 90 tablet 0  . insulin aspart (NOVOLOG) 100 UNIT/ML injection Inject under skin 10-14 UNITS 3 TIMES DAILY 50 mL 2  . insulin NPH Human (NOVOLIN N) 100 UNIT/ML injection INJECT 30 UNITS UNDER THE SKIN 3x a day 50 mL 3  . levocetirizine (XYZAL) 5 MG tablet Take 5 mg by mouth at bedtime as needed for allergies.   5  . levothyroxine (SYNTHROID) 125 MCG tablet 1 tab po qd 90 tablet 3  . meloxicam (MOBIC) 15 MG tablet TAKE 1 TABLET BY MOUTH EVERY  DAY AS NEEDED 30 tablet 3  . metFORMIN (GLUCOPHAGE) 500 MG tablet TAKE 1 TABLET BY MOUTH EVERY MORNING AND 2 TABLETS BY MOUTH AT BEDTIME. 270 tablet 1  . miconazole (MICRO GUARD) 2 % powder Apply 1 application topically 2 (two) times daily as needed for itching.    . pravastatin (PRAVACHOL) 20 MG tablet Take 1 tablet (20 mg total) by mouth daily. 90 tablet 0  . Probiotic Product (PROBIOTIC PO) Take 1 capsule by mouth daily.    . Insulin Syringes, Disposable, U-100 0.3 ML MISC Use to inject insulin--6 injections per day 540 each 3  . mupirocin ointment (BACTROBAN) 2 % Apply 1  application topically 3 (three) times daily. (Patient not taking: Reported on 07/19/2019) 15 g 1  . OZEMPIC, 0.25 OR 0.5 MG/DOSE, 2 MG/1.5ML SOPN INJECT 0.5 MG INTO THE SKIN ONCE A WEEK. (Patient not taking: Reported on 08/26/2019) 3 pen 1   No current facility-administered medications for this encounter.    Physical Findings: The patient is in no acute distress. Patient is alert and oriented.  height is 5\' 1"  (1.549 m) and weight is 267 lb 12.8 oz (121.5 kg). Her temperature is 98.5 F (36.9 C). Her blood pressure is 158/56 (abnormal) and her pulse is 94. Her respiration is 20 and oxygen saturation is 97%.   No significant changes. Lungs are clear to auscultation bilaterally. Heart has regular rate and rhythm. No palpable cervical, supraclavicular, or axillary adenopathy. Abdomen soft, non-tender, normal bowel sounds. On pelvic examination the external genitalia were unremarkable. No signs of yeast infection in the labia or vaginal area. A speculum exam was performed. There are no mucosal lesions noted in the vaginal vault. On bimanual examination, there were no pelvic masses appreciated. White changes in butterfly distribution consistent with lichen sclerosis along the distal vagina.  Body mass index is 50.6 kg/m.  Lab Findings: Lab Results  Component Value Date   WBC 10.1 01/21/2019   HGB 10.3 (L) 01/21/2019   HCT 31.5 (L) 01/21/2019   MCV 96.1 01/21/2019   PLT 223.0 01/21/2019    Radiographic Findings: No results found.  Impression: Stage IIIA endometroid endometrial carcinoma      No evidence of recurrence on clinical exam today.   Plan: She will follow-up with Dr. Denman George in three months and with radiation oncology in six months.   ____________________________________   Blair Promise, PhD, MD   This document serves as a record of services personally performed by Gery Pray, MD. It was created on his behalf by Clerance Lav, a trained medical scribe. The creation of this  record is based on the scribe's personal observations and the provider's statements to them. This document has been checked and approved by the attending provider.

## 2019-08-27 ENCOUNTER — Telehealth: Payer: Self-pay | Admitting: *Deleted

## 2019-08-27 NOTE — Telephone Encounter (Signed)
CALLED PATIENT TO INFORM OF FU WITH DR. Yale ON 02/27/20 @ 10 AM , SPOKE WITH PATIENT AND SHE IS AWARE OF THIS APPT.

## 2019-09-13 ENCOUNTER — Ambulatory Visit: Payer: Medicare Other | Admitting: Internal Medicine

## 2019-09-24 ENCOUNTER — Telehealth: Payer: Self-pay | Admitting: *Deleted

## 2019-09-24 NOTE — Telephone Encounter (Signed)
CALLED PATIENT TO INFORM OF FU WITH DR. Denman George ON 11-12-19 - ARRIVAL TIME- 2:45 PM, LVM FOR A RETURN CALL

## 2019-10-12 ENCOUNTER — Other Ambulatory Visit: Payer: Self-pay | Admitting: Family Medicine

## 2019-10-16 ENCOUNTER — Other Ambulatory Visit: Payer: Self-pay

## 2019-10-16 MED ORDER — FUROSEMIDE 20 MG PO TABS: 20.0000 mg | ORAL_TABLET | Freq: Every day | ORAL | 0 refills | Status: DC

## 2019-10-29 ENCOUNTER — Other Ambulatory Visit: Payer: Self-pay

## 2019-10-29 MED ORDER — HYDROCODONE-ACETAMINOPHEN 5-325 MG PO TABS: ORAL_TABLET | ORAL | 0 refills | Status: DC

## 2019-10-29 NOTE — Telephone Encounter (Signed)
Will send in #30 but I need to see her for chronic pain f/u in the next 1 mo as per pain mgmt contract protocol.-thx

## 2019-10-29 NOTE — Telephone Encounter (Signed)
Patient requests Hydrocodone refill [facility] [address], [place]

## 2019-10-29 NOTE — Telephone Encounter (Signed)
Patient advised f/u appt needed prior to refills. Appt scheduled for 7/29, virtual

## 2019-10-29 NOTE — Telephone Encounter (Signed)
Requesting: [employer] Contract:11/20/18 UDS:n/a Last Visit:07/22/19 Next Visit:advised to f/u 3 months Last Refill:07/19/19(90,0)  Please Advise. Medication pending

## 2019-11-07 ENCOUNTER — Other Ambulatory Visit: Payer: Self-pay

## 2019-11-07 ENCOUNTER — Telehealth: Payer: Medicare Other | Admitting: Family Medicine

## 2019-11-12 ENCOUNTER — Other Ambulatory Visit: Payer: Self-pay

## 2019-11-12 ENCOUNTER — Inpatient Hospital Stay: Payer: Medicare Other | Attending: Gynecologic Oncology | Admitting: Gynecologic Oncology

## 2019-11-12 VITALS — BP 153/53 | HR 80 | Temp 98.9°F | Resp 17 | Ht 61.0 in | Wt 269.5 lb

## 2019-11-12 DIAGNOSIS — Z7951 Long term (current) use of inhaled steroids: Secondary | ICD-10-CM | POA: Diagnosis not present

## 2019-11-12 DIAGNOSIS — G4733 Obstructive sleep apnea (adult) (pediatric): Secondary | ICD-10-CM | POA: Diagnosis not present

## 2019-11-12 DIAGNOSIS — E669 Obesity, unspecified: Secondary | ICD-10-CM | POA: Diagnosis not present

## 2019-11-12 DIAGNOSIS — J4550 Severe persistent asthma, uncomplicated: Secondary | ICD-10-CM | POA: Diagnosis not present

## 2019-11-12 DIAGNOSIS — E785 Hyperlipidemia, unspecified: Secondary | ICD-10-CM | POA: Insufficient documentation

## 2019-11-12 DIAGNOSIS — E119 Type 2 diabetes mellitus without complications: Secondary | ICD-10-CM | POA: Diagnosis not present

## 2019-11-12 DIAGNOSIS — Z79899 Other long term (current) drug therapy: Secondary | ICD-10-CM | POA: Insufficient documentation

## 2019-11-12 DIAGNOSIS — C541 Malignant neoplasm of endometrium: Secondary | ICD-10-CM | POA: Diagnosis not present

## 2019-11-12 DIAGNOSIS — Z791 Long term (current) use of non-steroidal anti-inflammatories (NSAID): Secondary | ICD-10-CM | POA: Insufficient documentation

## 2019-11-12 DIAGNOSIS — Z9221 Personal history of antineoplastic chemotherapy: Secondary | ICD-10-CM | POA: Diagnosis not present

## 2019-11-12 DIAGNOSIS — K219 Gastro-esophageal reflux disease without esophagitis: Secondary | ICD-10-CM | POA: Diagnosis not present

## 2019-11-12 DIAGNOSIS — I10 Essential (primary) hypertension: Secondary | ICD-10-CM | POA: Insufficient documentation

## 2019-11-12 DIAGNOSIS — Z794 Long term (current) use of insulin: Secondary | ICD-10-CM | POA: Insufficient documentation

## 2019-11-12 DIAGNOSIS — E039 Hypothyroidism, unspecified: Secondary | ICD-10-CM | POA: Insufficient documentation

## 2019-11-12 DIAGNOSIS — Z90710 Acquired absence of both cervix and uterus: Secondary | ICD-10-CM | POA: Insufficient documentation

## 2019-11-12 DIAGNOSIS — Z90722 Acquired absence of ovaries, bilateral: Secondary | ICD-10-CM | POA: Insufficient documentation

## 2019-11-12 DIAGNOSIS — Z923 Personal history of irradiation: Secondary | ICD-10-CM | POA: Insufficient documentation

## 2019-11-12 NOTE — Patient Instructions (Signed)
Please notify Dr Denman George at phone number [phone] if you notice vaginal bleeding, new pelvic or abdominal pains, bloating, feeling full easy, or a change in bladder or bowel function.   Please have Dr Clabe Seal office contact Dr Serita Grit office (at [phone]) in November after your visit with him to request an appointment with her for February, 2022.

## 2019-11-12 NOTE — Progress Notes (Signed)
[place] at [facility]   Follow-up Note: New Patient  Consult was initially requested by Dr. Hale Bogus for Grade 1 Endometrial Adenocarcinoma   Chief Complaint  Patient presents with  . Endometrial cancer ([place])    Follow Up     Assessment  Endometrial Cancer: stage IIIA grade 1 endometrioid (MMR normal, MSI stable) s/p staging surgery on 02/19/18. S/p adjuvant carboplatin and paclitaxel x 6 completed April, 2020. S/p adjuvant vaginal brachy completed February, 2020.   No evidence of recurrence.  Lichen sclerosis - if becomes symptomatic, would recommend clobetasol  Plan  Recommend ongoing 3 montly surveillance until April, 2022, then 6 monthly surveillance until April, 2025.   HPI: [name]  is a 69 y.o.  P2, - a retired Marine scientist  She had a h/o complex hyperplasia without atypia and was treated with a Mirena IUD x 5 years. ~2017 this was removed and she was noted to have followup biopsies with no residual hyperplasia. Then in July 2019 she had spotting for a few days. This resolved and then she noted another episode early Oct 2019. She presented to Dr. Sabra Heck and a TVUS was perfomed showing a thickened EM stripe with an area showing vascular flow. Therefore an EMB was performed and that revealed: Endometrium, biopsy - ENDOMETRIOID ADENOCARCINOMA, FIGO GRADE 1. - SEE COMMENT. Microscopic Comment Internal departmental review obtained (Dr. Lyndon Code) with agreement. Results are phoned to [facility] in the office of Dr. Edwinna Areola. (MEG:ah 01/31/18) Allison Lasso MD  She was therefore referred for management and recommendations  NOTABLE is her obesity. She states since losing her son, who died at 45 years old as complications from his Tetrology of Fallot, she has been overweight.   Adhesive allergy reviewed - She has done OK with steristrips after knee surgery PCN allergy reviewed - she has used Keflex without issue  States last  HgbA1C was 7.3 12/2017  Imported EPIC Oncologic History:  Oncology History Overview Note  Endometrioid MSI stable   Endometrial cancer ([place])  01/24/2018 Initial Diagnosis   Patient was seen by GYN for PMB since July 2019.  Pt has hx of complex endometrial hyperplasia without atypica that was treated with Mirena IUD for 5 years.  This was removed about two years ago.  She did have follow up biopsies after placement and did have resolution of hyperplasia.    Patient's last menstrual period was 08/10/2010.   01/25/2018 Pathology Results   Endometrium, biopsy - ENDOMETRIOID ADENOCARCINOMA, FIGO GRADE 1. - SEE COMMENT.   01/25/2018 Procedure   She had endometrial biopsy in GYN office   01/25/2018 Imaging   US pelvis  UTERUS: 8.5 x 5.3 x 4.5cm EMS: 18-62m, areas of vascular flow noted as well ADNEXA: Left ovary: not seen transvaginally or transabdominally due to habitus                  Right ovary: same as with left ovary CUL DE SAC: no free fluid    02/19/2018 Pathology Results   Uterus +/- tubes/ovaries, neoplastic, cervix - UTERUS: -ENDO/MYOMETRIUM: INVASIVE ENDOMETRIOID ADENOCARCINOMA WITH FOCAL SQUAMOUS DIFFERENTIATION (FIGO GRADE 1), SPANNING 5.8 CM. TUMOR INVADES LESS THAN ONE HALF OF THE MYOMETRIUM. TUMOR INVOLVES CERVICAL STROMA AND BILATERAL FALLOPIAN TUBE LUMEN. SEE ONCOLOGY TABLE. -SEROSA: UNREMARKABLE. NO MALIGNANCY. - CERVIX: STROMAL INVOLVEMENT BY ENDOMETRIOID ADENOCARCINOMA. - BILATERAL OVARIES: INCLUSION CYSTS. NO MALIGNANCY. - BILATERAL FALLOPIAN TUBES: LUMEN INVOLVEMENT BY ENDOMETRIOID ADENOCARCINOMA. Microscopic Comment UTERUS, CARCINOMA OR CARCINOSARCOMA Procedure: Total  hysterectomy with bilateral salpingo-oophorectomy. Histologic type: Endometrioid adenocarcinoma with squamous differentiation. Histologic Grade: FIGO grade 1. Myometrial invasion: Depth of invasion: 3 mm Myometrial thickness: 17 mm Uterine Serosa Involvement: Not  identified. Cervical stromal involvement: Present. Extent of involvement of other organs: Involves cervical stroma and lumen of bilateral fallopian tubes. Lymphovascular invasion: Not identified. Regional Lymph Nodes: None examined. Tumor block for ancillary studies: 1D-G MMR / MSI testing: Will be ordered.   02/19/2018 Surgery   Surgeon: Donaciano Eva   Operation: Robotic-assisted laparoscopic total hysterectomy with bilateral salpingoophorectomy (22 modifier for extreme obesity - see details in body of operative note)   Operative Findings:  : extreme intraperitoneal adiposity which prevented retroperitoneal visualization of the lymph node basins and increased the duration of the procedure by 1 hour (for additional instrumentation, placement of retraction sutures, additional personnel required for assistance.  8cm uterus, grossly normal ovaries, surgically interrupted tubes   03/05/2018 Imaging   1. Upper normal pelvic sidewall lymph nodes bilaterally. Close attention on follow-up recommended. 2. No other findings to suggest metastatic disease in the chest, abdomen, or pelvis. 3.  Aortic Atherosclerois (ICD10-170.0)    03/12/2018 Cancer Staging   Staging form: Corpus Uteri - Carcinoma and Carcinosarcoma, AJCC 8th Edition - Pathologic: Stage III (pT3, pN0, cM0) - Signed by Heath Lark, MD on 03/12/2018   03/16/2018 Procedure   Ultrasound and fluoroscopically guided right internal jugular single lumen power port catheter insertion. Tip in the SVC/RA junction. Catheter ready for use.   03/20/2018 - 07/17/2018 Chemotherapy   The patient had carboplatin and taxol x 6 cycles   04/24/2018 - 05/21/2018 Radiation Therapy   Radiation treatment dates:   04/24/18, 05/03/18, 05/07/18, 05/16/18, 05/21/18  Site/dose:  Vaginal cuff, 6 Gy in 5 fractions for a total dose of 30 Gy  Beams/energy:   HDR Ir-Vaginal, Iridium HDR,patient was treated with a 2.5 cm diameter segmented cylinder,  Rxlength of 3 cm, prescription was 6 gray to the mucosal surface    08/16/2018 Imaging   1. Status post hysterectomy. No CT evidence of metastatic disease in the abdomen or pelvis. Unchanged appearance of subcentimeter pelvic sidewall lymph nodes.  2. Other chronic, incidental, and postoperative findings as detailed above.   09/20/2018 Procedure   Removal of implanted Port-A-Cath utilizing sharp and blunt dissection. The procedure was uncomplicated     Measurement of disease: . TBD  Radiology: . 01/25/18 - TVUS  OSH - UTERUS: 8.5 x 5.3 x 4.5cm EMS: 18-56m, areas of vascular flow noted as well ADNEXA: Left ovary: not seen transvaginally or transabdominally due to habitus Right ovary: same as with left ovary CUL DE SAC: no free fluid . CT chest/abd/pelvis on 03/05/18 (postop) showed upper limit size pelvic nodes, but no other signs of metastatic disease.     Interval Hx:  Since completing chemotherapy in April, 2020 she has had persistent neuropathy, fatigue and vulvar irritation.  Her diabetes remains under modest control. Her most recent HbA1c was 6.6. She was started on Ozempic.   She stopped Ozempic after it was causing GI problems.  She had 1 episode of vaginal bleeding in June, 2021. This was associated with straining for a BM.   Outpatient Encounter Medications as of 11/12/2019  Medication Sig  . albuterol (PROVENTIL) (2.5 MG/3ML) 0.083% nebulizer solution Take 3 mLs (2.5 mg total) by nebulization every 4 (four) hours as needed for wheezing or shortness of breath (Dx: J45.50).  .Marland Kitchenalbuterol (VENTOLIN HFA) 108 (90 Base) MCG/ACT inhaler INHALE 2 PUFFS  INTO LUNGS EVERY 6 HOURS AS NEEDED FOR WHEEZING/SHORTNESS OF BREATH  . budesonide-formoterol (SYMBICORT) 160-4.5 MCG/ACT inhaler TAKE 2 PUFFS BY MOUTH TWICE A DAY  . clobetasol ointment (TEMOVATE) 8.10 % Apply 1 application topically at bedtime. Apply to the skin of the vulva for 12 weeks  . diclofenac Sodium (VOLTAREN) 1 % GEL Apply  2 g topically 4 (four) times daily.  . felodipine (PLENDIL) 10 MG 24 hr tablet TAKE 1 TABLET BY MOUTH EVERY DAY  . fluticasone (CUTIVATE) 0.05 % cream APPLY TO AFFECTED AREA OF R LOWER LEG TWICE PER DAY.  . fluticasone (FLONASE SENSIMIST) 27.5 MCG/SPRAY nasal spray Place 1 spray into the nose daily as needed for rhinitis.  . furosemide (LASIX) 20 MG tablet Take 1 tablet (20 mg total) by mouth daily.  Marland Kitchen HYDROcodone-acetaminophen (NORCO/VICODIN) 5-325 MG tablet 1-2 tabs po bid prn pain  . insulin aspart (NOVOLOG) 100 UNIT/ML injection Inject under skin 10-14 UNITS 3 TIMES DAILY  . insulin NPH Human (NOVOLIN N) 100 UNIT/ML injection INJECT 30 UNITS UNDER THE SKIN 3x a day  . Insulin Syringes, Disposable, U-100 0.3 ML MISC Use to inject insulin--6 injections per day  . levocetirizine (XYZAL) 5 MG tablet Take 5 mg by mouth at bedtime as needed for allergies.   Marland Kitchen levothyroxine (SYNTHROID) 125 MCG tablet 1 tab po qd  . meloxicam (MOBIC) 15 MG tablet TAKE 1 TABLET BY MOUTH EVERY DAY AS NEEDED  . metFORMIN (GLUCOPHAGE) 500 MG tablet Take 1 tablet by mouth every morning and 2 tablets by mouth at bedtime. OFFICE VISIT NEEDED  . miconazole (MICRO GUARD) 2 % powder Apply 1 application topically 2 (two) times daily as needed for itching.  . mupirocin ointment (BACTROBAN) 2 % Apply 1 application topically 3 (three) times daily.  . pravastatin (PRAVACHOL) 20 MG tablet Take 1 tablet (20 mg total) by mouth daily.  . Probiotic Product (PROBIOTIC PO) Take 1 capsule by mouth daily.  . [DISCONTINUED] OZEMPIC, 0.25 OR 0.5 MG/DOSE, 2 MG/1.5ML SOPN INJECT 0.5 MG INTO THE SKIN ONCE A WEEK. (Patient not taking: Reported on 08/26/2019)  . [DISCONTINUED] prochlorperazine (COMPAZINE) 10 MG tablet Take 1 tablet (10 mg total) by mouth every 6 (six) hours as needed (Nausea or vomiting).   No facility-administered encounter medications on file as of 11/12/2019.   Allergies  Allergen Reactions  . Adhesive [Tape] Other (See  Comments)    Takes patient's skin off.  . Banana Diarrhea and Nausea Only  . Eggs Or Egg-Derived Products Diarrhea and Nausea Only  . Latex Other (See Comments)    sensativity  . Penicillins Rash and Other (See Comments)    Has had cephalosporins without incident  . Pine Swelling and Rash  . Rose Swelling and Rash    Past Medical History:  Diagnosis Date  . Allergic rhinitis    Allergy testing: results pending as of 05/28/16 (Dr. Harold Hedge)  . Anemia   . Arthritis   . Chemotherapy-induced neuropathy ([place]) 2019  . Cough variant asthma   . DDD (degenerative disc disease), lumbar   . Diabetes mellitus with complication (HCC)    Mild nonprolif DR R eye 12/2017-->referred to retinal specialist DM MANAGED BY DR. GHERGHE AS OF 2021  . Endometrial cancer ([place]) 02/2018   REMISSION AS OF 08/2018.  Stage III (T3, Nx, Mx)  Path: endometroid adenocarcinoma involving cervix, uterus, and fallopian tubes (Pt got TAH & BSO 02/2018). No sign of recurrence as of onc f/u 05/2019.  Marland Kitchen GERD (gastroesophageal reflux  disease)   . Hemorrhoids   . Herpes zoster 10/2015   L side belt-line  . History of adenomatous polyp of colon 02/25/13   5 mm cecal polyp removed by Dr. Trevor Mace 5 yrs  . History of blood transfusion   . History of cellulitis 08/2010   Left (Since replacemnt of Left knee  . Hyperkalemia 03/2018   started after pt started getting chemo-->had to stop enalapril.  Marland Kitchen Hyperlipidemia    Not on statin b/c lipids "stayed down when sugars came down" per pt.  She says Dr. Chalmers Cater knows she is not on statin anymore.  . Hypertension   . Hypothyroidism   . Nephrolithiasis 4/07  . Obesity   . OSA on CPAP   . Osteoarthritis    knees, ankle, + ? scapholunate ligament disruption (x-ray 06/2017)--ortho referral.  . Pancytopenia due to antineoplastic chemotherapy ([place])    progressive as of 06/2018. Stable 08/2018.  Marland Kitchen Recurrent UTI    started cipro 250 qd 09/2018  . Sciatica of left side   . Severe  persistent asthma    cough-variant--saw Allergist 05/25/16 and was switched from max dose advair to symbicort.  . Systolic murmur    ECHO fine 10/2016-->murmur likely flow murmur assoc with HTN.  Marland Kitchen Zoon's vulvitis    Bx-proven (GYN) -lichen sclerosis.  Clobetasol 0.05% ointment per GYN   Past Surgical History:  Procedure Laterality Date  . ANKLE FRACTURE SURGERY  1984   Pin & repair  . ARTHROSCOPIC REPAIR ACL  2000   Due to ACL tear  . ARTHROSCOPIC REPAIR ACL  5/08  . Blateral knee rerlacements x4 2 times each knee    . BREAST BIOPSY Right 2014   Benign  . CARDIAC CATHETERIZATION  5/07   clear vessel mild mitral   . CARPAL TUNNEL RELEASE Right [id number]   1989 Left  . CESAREAN SECTION  1985  . CHOLECYSTECTOMY OPEN  1988  . COLONOSCOPY N/A 02/25/2013   Tubular adenoma x 1: Recall 5 yrs. Procedure: COLONOSCOPY;  Surgeon: Juanita Craver, MD;  Location: [facility];  Service: Endoscopy;  Laterality: N/A;  . COMBINED HYSTEROSCOPY DIAGNOSTIC / D&C  2/12   Bx neg  . DEXA  08/2007   Bone density normal.  . DILATION AND CURETTAGE OF UTERUS  12/11  . IR IMAGING GUIDED PORT INSERTION  03/16/2018  . IR REMOVAL TUN ACCESS W/ PORT W/O FL MOD SED  09/20/2018  . LYMPH NODE BIOPSY N/A 02/19/2018   Procedure: Sentinel LYMPH NODE BIOPSY;  Surgeon: Everitt Amber, MD;  Location: [facility];  Service: Gynecology;  Laterality: N/A;  . REPLACEMENT TOTAL KNEE Left 5/12   X 2 on each  . ROBOTIC ASSISTED TOTAL HYSTERECTOMY WITH BILATERAL SALPINGO OOPHERECTOMY N/A 02/19/2018   For endometrial cancer.  Procedure: XI ROBOTIC ASSISTED TOTAL HYSTERECTOMY WITH BILATERAL SALPINGO OOPHORECTOMY;  Surgeon: Everitt Amber, MD;  Location: [facility];  Service: Gynecology;  Laterality: N/A;  . TRANSTHORACIC ECHOCARDIOGRAM  10/11/2016    EF 55-60%, grd I DD.  . TUBAL LIGATION  1985   C-Section        Past Gynecological History:   GYNECOLOGIC HISTORY:  . Patient's last menstrual period was  08/10/2010.  . Menarche: 69 years old . P 2 . Contraceptive h/o Mirena IUD . HRT None  . Last Pap 10/2016 negative Family Hx:  Family History  Problem Relation Age of Onset  . Diabetes Father   . COPD Father   . Stroke Father   .  Celiac disease Brother   . Rheum arthritis Brother   . Thyroid disease Brother   . Diabetes Brother   . Alzheimer's disease Mother   . Arthritis Mother   . Other Son        Died age 96 -Tetrology of Fallot - VSD/pulmonary atresia  . Breast cancer Other        postmenopausal when diagnosed   Social Hx:  Marland Kitchen Tobacco use: none . Alcohol use: none . Illicit Drug use: none . Illicit IV Drug use: none    Review of Systems: Review of Systems  Constitutional: Positive for fatigue.  Genitourinary:        Vulvodynia  Neurological: Positive for numbness.       Foot neuropathy pain  All other systems reviewed and are negative.   Vitals:  Vitals:   11/12/19 1535  BP: (!) 153/53  Pulse: 80  Resp: 17  Temp: 98.9 F (37.2 C)  SpO2: 98%   Vitals:   11/12/19 1535  Weight: 269 lb 8 oz (122.2 kg)  Height: '5\' 1"'  (1.549 m)   Body mass index is 50.92 kg/m.  Physical Exam: General :  Obese. Well developed, 69 y.o., female in no apparent distress HEENT:  Normocephalic/atraumatic, symmetric, EOMI, eyelids normal Neck:   Supple, no masses.  Lymphatics:  No cervical/ submandibular/ supraclavicular/ infraclavicular/ inguinal adenopathy Respiratory:  Respirations unlabored, no use of accessory muscles CV:   Deferred Breast:  Deferred Musculoskeletal: No CVA tenderness, normal muscle strength. Abdomen:  Obese with pannus. Soft, non-tender and nondistended. No evidence of hernia. No masses. Extremities:  No lymphedema, no erythema, non-tender. Skin:   Normal inspection; candida at mons/groin Neuro/Psych:  No focal motor deficit, no abnormal mental status. Normal gait. Normal affect. Alert and oriented to person, place, and time  Genito Urinary: vaginal cuff  in tact no lesions or masses. White changes to butterfly distribution consistent with lichen sclerosis.   Cc: M. Edwinna Areola, MD (Referring Ob/Gyn) Anitra Lauth Adrian Blackwater, MD (PCP)

## 2019-11-13 ENCOUNTER — Telehealth (INDEPENDENT_AMBULATORY_CARE_PROVIDER_SITE_OTHER): Payer: Medicare Other | Admitting: Family Medicine

## 2019-11-13 ENCOUNTER — Encounter: Payer: Self-pay | Admitting: Family Medicine

## 2019-11-13 VITALS — BP 155/65 | HR 83 | Temp 98.0°F | Wt 265.0 lb

## 2019-11-13 DIAGNOSIS — M5442 Lumbago with sciatica, left side: Secondary | ICD-10-CM | POA: Diagnosis not present

## 2019-11-13 DIAGNOSIS — E039 Hypothyroidism, unspecified: Secondary | ICD-10-CM | POA: Diagnosis not present

## 2019-11-13 DIAGNOSIS — G8929 Other chronic pain: Secondary | ICD-10-CM | POA: Diagnosis not present

## 2019-11-13 DIAGNOSIS — I10 Essential (primary) hypertension: Secondary | ICD-10-CM | POA: Diagnosis not present

## 2019-11-13 DIAGNOSIS — M792 Neuralgia and neuritis, unspecified: Secondary | ICD-10-CM

## 2019-11-13 DIAGNOSIS — M5441 Lumbago with sciatica, right side: Secondary | ICD-10-CM | POA: Diagnosis not present

## 2019-11-13 DIAGNOSIS — R079 Chest pain, unspecified: Secondary | ICD-10-CM | POA: Diagnosis not present

## 2019-11-13 DIAGNOSIS — J4550 Severe persistent asthma, uncomplicated: Secondary | ICD-10-CM | POA: Diagnosis not present

## 2019-11-13 MED ORDER — HYDROCODONE-ACETAMINOPHEN 5-325 MG PO TABS: ORAL_TABLET | ORAL | 0 refills | Status: DC

## 2019-11-13 MED ORDER — GABAPENTIN 100 MG PO CAPS: ORAL_CAPSULE | ORAL | 1 refills | Status: DC

## 2019-11-13 NOTE — Progress Notes (Signed)
Virtual Visit via Video Note  I connected with [name] on 11/13/19 at  3:30 PM EDT by a video enabled telemedicine application and verified that I am speaking with the correct person using two identifiers.  Location patient: home Location provider:work or home office Persons participating in the virtual visit: patient, provider  I discussed the limitations of evaluation and management by telemedicine and the availability of in person appointments. The patient expressed understanding and agreed to proceed.   HPI: 69 y/o WF being seen today for f/u chronic pain syndrome, HTN, and severe persistent asthma. A/P as of last visit: "1) Chronic pain syndrome: osteoarthritis multiple sites, chemo-induced PN. The current medical regimen is effective, but we'll also add voltaren gel for use on hands and knees. I did electronic rx for vicodin 5/325, 1-2 bid prn, #90 today.  Appropriate fill on/after date was noted on each rx.  2) Gluteal skin (pressure?) ulcer->she is finding the otc colloid dressing she got on Amazone helpful and tolerable (see hpi)--Mepilex HE."  INTERIM HX: More low back with sciatica pain last few weeks, down both legs. Chronic burning R>L foot, some diminished sensation in feet as well. Voltaren helps back sometimes, takes 1-3 vicodin per day.  No side effects of vicodin. Has no orthopedist, no recent back imaging.    HTN: home bp normal except last few weeks when hurting more.   Yesterday she had routine onc f/u: aleve helped her back signif, helped her go to the appt.  Indication for chronic opioid:osteoarthritis in hands, low back, feet, L hip, knees, +chemotherapy -induced neuropathy (burning and numbness in feet). Medication and dose:Vicodin 5/325, 1-2 bid prn # pills per month: 90. PMP AWARE reviewed today: most recent rx for vicodin was filled 10/29/19, # 42, rx by me. No red flags.  ASthma: breathing has been good for the most part. Occ bursts of albut needed for  high allergy periods of low pressure weather systems.  ROS: See pertinent positives and negatives per HPI.  Past Medical History:  Diagnosis Date  . Allergic rhinitis    Allergy testing: results pending as of 05/28/16 (Dr. Harold Hedge)  . Anemia   . Arthritis   . Chemotherapy-induced neuropathy ([place]) 2019  . Cough variant asthma   . DDD (degenerative disc disease), lumbar   . Diabetes mellitus with complication (HCC)    Mild nonprolif DR R eye 12/2017-->referred to retinal specialist DM MANAGED BY DR. GHERGHE AS OF 2021  . Endometrial cancer ([place]) 02/2018   REMISSION AS OF 08/2018.  Stage III (T3, Nx, Mx)  Path: endometroid adenocarcinoma involving cervix, uterus, and fallopian tubes ([name] got TAH & BSO 02/2018). No sign of recurrence as of onc f/u 05/2019.  Marland Kitchen GERD (gastroesophageal reflux disease)   . Hemorrhoids   . Herpes zoster 10/2015   L side belt-line  . History of adenomatous polyp of colon 02/25/13   5 mm cecal polyp removed by Dr. Trevor Mace 5 yrs  . History of blood transfusion   . History of cellulitis 08/2010   Left (Since replacemnt of Left knee  . Hyperkalemia 03/2018   started after [name] started getting chemo-->had to stop enalapril.  Marland Kitchen Hyperlipidemia    Not on statin b/c lipids "stayed down when sugars came down" per [name].  She says Dr. Chalmers Cater knows she is not on statin anymore.  . Hypertension   . Hypothyroidism   . Nephrolithiasis 4/07  . Obesity   . OSA on CPAP   . Osteoarthritis  knees, ankle, + ? scapholunate ligament disruption (x-ray 06/2017)--ortho referral.  . Pancytopenia due to antineoplastic chemotherapy ([place])    progressive as of 06/2018. Stable 08/2018.  Marland Kitchen Recurrent UTI    started cipro 250 qd 09/2018  . Sciatica of left side   . Severe persistent asthma    cough-variant--saw Allergist 05/25/16 and was switched from max dose advair to symbicort.  . Systolic murmur    ECHO fine 10/2016-->murmur likely flow murmur assoc with HTN.  Marland Kitchen Zoon's vulvitis     Bx-proven (GYN) -lichen sclerosis.  Clobetasol 0.05% ointment per GYN    Past Surgical History:  Procedure Laterality Date  . ANKLE FRACTURE SURGERY  1984   Pin & repair  . ARTHROSCOPIC REPAIR ACL  2000   Due to ACL tear  . ARTHROSCOPIC REPAIR ACL  5/08  . Blateral knee rerlacements x4 2 times each knee    . BREAST BIOPSY Right 2014   Benign  . CARDIAC CATHETERIZATION  5/07   clear vessel mild mitral   . CARPAL TUNNEL RELEASE Right [id number]   1989 Left  . CESAREAN SECTION  1985  . CHOLECYSTECTOMY OPEN  1988  . COLONOSCOPY N/A 02/25/2013   Tubular adenoma x 1: Recall 5 yrs. Procedure: COLONOSCOPY;  Surgeon: Juanita Craver, MD;  Location: [facility];  Service: Endoscopy;  Laterality: N/A;  . COMBINED HYSTEROSCOPY DIAGNOSTIC / D&C  2/12   Bx neg  . DEXA  08/2007   Bone density normal.  . DILATION AND CURETTAGE OF UTERUS  12/11  . IR IMAGING GUIDED PORT INSERTION  03/16/2018  . IR REMOVAL TUN ACCESS W/ PORT W/O FL MOD SED  09/20/2018  . LYMPH NODE BIOPSY N/A 02/19/2018   Procedure: Sentinel LYMPH NODE BIOPSY;  Surgeon: Everitt Amber, MD;  Location: [facility];  Service: Gynecology;  Laterality: N/A;  . REPLACEMENT TOTAL KNEE Left 5/12   X 2 on each  . ROBOTIC ASSISTED TOTAL HYSTERECTOMY WITH BILATERAL SALPINGO OOPHERECTOMY N/A 02/19/2018   For endometrial cancer.  Procedure: XI ROBOTIC ASSISTED TOTAL HYSTERECTOMY WITH BILATERAL SALPINGO OOPHORECTOMY;  Surgeon: Everitt Amber, MD;  Location: [facility];  Service: Gynecology;  Laterality: N/A;  . TRANSTHORACIC ECHOCARDIOGRAM  10/11/2016    EF 55-60%, grd I DD.  . TUBAL LIGATION  1985   C-Section    Family History  Problem Relation Age of Onset  . Diabetes Father   . COPD Father   . Stroke Father   . Celiac disease Brother   . Rheum arthritis Brother   . Thyroid disease Brother   . Diabetes Brother   . Alzheimer's disease Mother   . Arthritis Mother   . Other Son        Died age 68 -Tetrology of  [place] - VSD/pulmonary atresia  . Breast cancer Other        postmenopausal when diagnosed      Current Outpatient Medications:  .  albuterol (PROVENTIL) (2.5 MG/3ML) 0.083% nebulizer solution, Take 3 mLs (2.5 mg total) by nebulization every 4 (four) hours as needed for wheezing or shortness of breath (Dx: J45.50)., Disp: 75 mL, Rfl: 1 .  albuterol (VENTOLIN HFA) 108 (90 Base) MCG/ACT inhaler, INHALE 2 PUFFS INTO LUNGS EVERY 6 HOURS AS NEEDED FOR WHEEZING/SHORTNESS OF BREATH, Disp: 18 g, Rfl: 2 .  budesonide-formoterol (SYMBICORT) 160-4.5 MCG/ACT inhaler, TAKE 2 PUFFS BY MOUTH TWICE A DAY, Disp: 10.2 Inhaler, Rfl: 11 .  clobetasol ointment (TEMOVATE) 6.29 %, Apply 1 application topically at bedtime.  Apply to the skin of the vulva for 12 weeks, Disp: 30 g, Rfl: 2 .  diclofenac Sodium (VOLTAREN) 1 % GEL, Apply 2 g topically 4 (four) times daily., Disp: 100 g, Rfl: 3 .  felodipine (PLENDIL) 10 MG 24 hr tablet, TAKE 1 TABLET BY MOUTH EVERY DAY, Disp: 90 tablet, Rfl: 1 .  fluticasone (CUTIVATE) 0.05 % cream, APPLY TO AFFECTED AREA OF R LOWER LEG TWICE PER DAY., Disp: 30 g, Rfl: 0 .  fluticasone (FLONASE SENSIMIST) 27.5 MCG/SPRAY nasal spray, Place 1 spray into the nose daily as needed for rhinitis., Disp: , Rfl:  .  furosemide (LASIX) 20 MG tablet, Take 1 tablet (20 mg total) by mouth daily., Disp: 90 tablet, Rfl: 0 .  HYDROcodone-acetaminophen (NORCO/VICODIN) 5-325 MG tablet, 1-2 tabs po bid prn pain, Disp: 30 tablet, Rfl: 0 .  insulin aspart (NOVOLOG) 100 UNIT/ML injection, Inject under skin 10-14 UNITS 3 TIMES DAILY, Disp: 50 mL, Rfl: 2 .  insulin NPH Human (NOVOLIN N) 100 UNIT/ML injection, INJECT 30 UNITS UNDER THE SKIN 3x a day, Disp: 50 mL, Rfl: 3 .  Insulin Syringes, Disposable, U-100 0.3 ML MISC, Use to inject insulin--6 injections per day, Disp: 540 each, Rfl: 3 .  levocetirizine (XYZAL) 5 MG tablet, Take 5 mg by mouth at bedtime as needed for allergies. , Disp: , Rfl: 5 .  levothyroxine  (SYNTHROID) 125 MCG tablet, 1 tab po qd, Disp: 90 tablet, Rfl: 3 .  meloxicam (MOBIC) 15 MG tablet, TAKE 1 TABLET BY MOUTH EVERY DAY AS NEEDED, Disp: 30 tablet, Rfl: 3 .  metFORMIN (GLUCOPHAGE) 500 MG tablet, Take 1 tablet by mouth every morning and 2 tablets by mouth at bedtime. OFFICE VISIT NEEDED, Disp: 270 tablet, Rfl: 0 .  miconazole (MICRO GUARD) 2 % powder, Apply 1 application topically 2 (two) times daily as needed for itching., Disp: , Rfl:  .  mupirocin ointment (BACTROBAN) 2 %, Apply 1 application topically 3 (three) times daily., Disp: 15 g, Rfl: 1 .  pravastatin (PRAVACHOL) 20 MG tablet, Take 1 tablet (20 mg total) by mouth daily., Disp: 90 tablet, Rfl: 0 .  Probiotic Product (PROBIOTIC PO), Take 1 capsule by mouth daily., Disp: , Rfl:   EXAM:  VITALS per patient if applicable:  Vitals with BMI 11/13/2019 11/12/2019 08/26/2019  Height - 5\' 1"  5\' 1"   Weight 265 lbs 269 lbs 8 oz 267 lbs 13 oz  BMI 50.1 42.68 34.19  Systolic 622 297 989  Diastolic 65 53 56  Pulse 83 80 94  O2 sat on RA today is 92%   GENERAL: alert, oriented, appears well and in no acute distress  HEENT: atraumatic, conjunttiva clear, no obvious abnormalities on inspection of external nose and ears  NECK: normal movements of the head and neck  LUNGS: on inspection no signs of respiratory distress, breathing rate appears normal, no obvious gross SOB, gasping or wheezing  CV: no obvious cyanosis  MS: moves all visible extremities without noticeable abnormality  PSYCH/NEURO: pleasant and cooperative, no obvious depression or anxiety, speech and thought processing grossly intact  LABS: none today  Lab Results  Component Value Date   TSH 1.88 10/05/2018   Lab Results  Component Value Date   WBC 10.1 01/21/2019   HGB 10.3 (L) 01/21/2019   HCT 31.5 (L) 01/21/2019   MCV 96.1 01/21/2019   PLT 223.0 01/21/2019  No results found for: IRON, TIBC, FERRITIN No results found for: VITAMINB12  Lab Results   Component Value Date  CREATININE 0.79 01/21/2019   BUN 24 (H) 01/21/2019   NA 140 01/21/2019   K 3.6 01/21/2019   CL 102 01/21/2019   CO2 27 01/21/2019   Lab Results  Component Value Date   ALT 15 10/05/2018   AST 20 10/05/2018   ALKPHOS 66 10/05/2018   BILITOT 0.4 10/05/2018   Lab Results  Component Value Date   CHOL 136 10/05/2018   Lab Results  Component Value Date   HDL 27.10 (L) 10/05/2018   Lab Results  Component Value Date   LDLCALC 75 02/13/2017   Lab Results  Component Value Date   TRIG 232.0 (H) 10/05/2018   Lab Results  Component Value Date   CHOLHDL 5 10/05/2018   Lab Results  Component Value Date   HGBA1C 6.6 (A) 05/07/2019   ASSESSMENT AND PLAN:  Discussed the following assessment and plan:  1) chronic pain syndrome: pretty stable/normal fluctuations---no changes in opioids, also ok to take aleve 1-2 tabs bid prn.  Will add gabapentin 100, 1-3 tabs tid---titrate slowly. Therapeutic expectations and side effect profile of medication discussed today.  Patient's questions answered. I did electronic rx's for vicodin 5/325, 1-2 bid prn, #90 today for each of the next 3 mo.  Appropriate fill on/after date was noted on each rx.  2) HTN: reasonable control, some elevated readings assoc with peaks in pain intensity. Lytes/cr future--ordered.  3) Hypothyroidism: due for TSH monitoring--ordered future.  4) Asthma, severe persistent: stable lately/well controlled. Continue symbicort, albuterol, and allergy controller meds.  5) HLD: tolerating statin.  Future FLP and hepatic panel--ordered.  6) DM 2: managed by Dr. Cruzita Lederer.  7) Hx of endometrial Ca->GYN ONC f/u 11/12/19 showed no sign of recurrence. GYN ONC has recommend ongoing 3 monthly surveillance until April, 2022, then 6 monthly surveillance until April, 2025.  -we discussed possible serious and likely etiologies, options for evaluation and workup, limitations of telemedicine visit vs in person  visit, treatment, treatment risks and precautions. [name] prefers to treat via telemedicine empirically rather then risking or undertaking an in person visit at this moment. Patient agrees to seek prompt in person care if worsening, new symptoms arise, or if is not improving with treatment.   I discussed the assessment and treatment plan with the patient. The patient was provided an opportunity to ask questions and all were answered. The patient agreed with the plan and demonstrated an understanding of the instructions.   The patient was advised to call back or seek an in-person evaluation if the symptoms worsen or if the condition fails to improve as anticipated.  F/u: 1 mo LBP with radic  Signed:  Crissie Sickles, MD           11/13/2019

## 2019-11-19 ENCOUNTER — Other Ambulatory Visit: Payer: Self-pay | Admitting: Family Medicine

## 2019-11-29 ENCOUNTER — Other Ambulatory Visit: Payer: Self-pay | Admitting: Family Medicine

## 2019-12-18 ENCOUNTER — Other Ambulatory Visit: Payer: Self-pay

## 2019-12-18 ENCOUNTER — Encounter: Payer: Self-pay | Admitting: Family Medicine

## 2019-12-18 ENCOUNTER — Ambulatory Visit (INDEPENDENT_AMBULATORY_CARE_PROVIDER_SITE_OTHER): Payer: Medicare Other | Admitting: Family Medicine

## 2019-12-18 VITALS — BP 152/78 | HR 74 | Temp 98.2°F | Resp 16 | Wt 271.2 lb

## 2019-12-18 DIAGNOSIS — M5136 Other intervertebral disc degeneration, lumbar region: Secondary | ICD-10-CM

## 2019-12-18 DIAGNOSIS — G8929 Other chronic pain: Secondary | ICD-10-CM | POA: Diagnosis not present

## 2019-12-18 DIAGNOSIS — M5442 Lumbago with sciatica, left side: Secondary | ICD-10-CM | POA: Diagnosis not present

## 2019-12-18 DIAGNOSIS — M47816 Spondylosis without myelopathy or radiculopathy, lumbar region: Secondary | ICD-10-CM | POA: Diagnosis not present

## 2019-12-18 DIAGNOSIS — I10 Essential (primary) hypertension: Secondary | ICD-10-CM

## 2019-12-18 DIAGNOSIS — Z79899 Other long term (current) drug therapy: Secondary | ICD-10-CM | POA: Diagnosis not present

## 2019-12-18 DIAGNOSIS — G894 Chronic pain syndrome: Secondary | ICD-10-CM

## 2019-12-18 DIAGNOSIS — M5441 Lumbago with sciatica, right side: Secondary | ICD-10-CM

## 2019-12-18 LAB — COMPREHENSIVE METABOLIC PANEL
ALT: 17 U/L (ref 0–35)
AST: 18 U/L (ref 0–37)
Albumin: 3.9 g/dL (ref 3.5–5.2)
Alkaline Phosphatase: 62 U/L (ref 39–117)
BUN: 25 mg/dL — ABNORMAL HIGH (ref 6–23)
CO2: 28 mEq/L (ref 19–32)
Calcium: 9.4 mg/dL (ref 8.4–10.5)
Chloride: 101 mEq/L (ref 96–112)
Creatinine, Ser: 0.84 mg/dL (ref 0.40–1.20)
GFR: 67.23 mL/min (ref >60.00)
Glucose, Bld: 149 mg/dL — ABNORMAL HIGH (ref 70–99)
Potassium: 4.5 mEq/L (ref 3.5–5.1)
Sodium: 139 mEq/L (ref 135–145)
Total Bilirubin: 0.4 mg/dL (ref 0.2–1.2)
Total Protein: 7.5 g/dL (ref 6.0–8.3)

## 2019-12-18 LAB — LIPID PANEL
Cholesterol: 172 mg/dL (ref 0–200)
HDL: 39.6 mg/dL (ref >39.00)
LDL Cholesterol: 99 mg/dL (ref 0–99)
NonHDL: 132.19
Total CHOL/HDL Ratio: 4
Triglycerides: 164 mg/dL — ABNORMAL HIGH (ref 0.0–149.0)
VLDL: 32.8 mg/dL (ref 0.0–40.0)

## 2019-12-18 LAB — TSH: TSH: 2.88 u[IU]/mL (ref 0.35–4.50)

## 2019-12-18 MED ORDER — AMITRIPTYLINE HCL 25 MG PO TABS: 25.0000 mg | ORAL_TABLET | Freq: Every day | ORAL | 1 refills | Status: DC

## 2019-12-18 NOTE — Progress Notes (Signed)
OFFICE VISIT  12/18/2019  CC:  Chief Complaint  Patient presents with  . Follow-up    back pain    HPI:    Patient is a 69 y.o. Caucasian female who presents for 1 mo f/u chronic pain syndrome-- Low back pain.  Also f/u HTN plus due for labs. A/P as of last visit: ") chronic pain syndrome: pretty stable/normal fluctuations---no changes in opioids, also ok to take aleve 1-2 tabs bid prn.  Will add gabapentin 100, 1-3 tabs tid---titrate slowly. Therapeutic expectations and side effect profile of medication discussed today.  Patient's questions answered. I did electronic rx's for vicodin 5/325, 1-2 bid prn, #90 today for each of the next 3 mo.  Appropriate fill on/after date was noted on each rx.  2) HTN: reasonable control, some elevated readings assoc with peaks in pain intensity. Lytes/cr future--ordered.  3) Hypothyroidism: due for TSH monitoring--ordered future.  4) Asthma, severe persistent: stable lately/well controlled. Continue symbicort, albuterol, and allergy controller meds.  5) HLD: tolerating statin.  Future FLP and hepatic panel--ordered.  6) DM 2: managed by Dr. Cruzita Lederer.  7) Hx of endometrial Ca->GYN ONC f/u 11/12/19 showed no sign of recurrence. GYN ONC has recommend ongoing 3 monthly surveillance until April, 2022, then 6 monthly surveillance until April, 2025.  -we discussed possible serious and likely etiologies, options for evaluation and workup, limitations of telemedicine visit vs in person visit, treatment, treatment risks and precautions. Pt prefers to treat via telemedicine empirically rather then risking or undertaking an in person visit at this moment. Patient agrees to seek prompt in person care if worsening, new symptoms arise, or if is not improving with treatment.  I discussed the assessment and treatment plan with the patient. The patient was provided an opportunity to ask questions and all were answered. The patient agreed with the plan and  demonstrated an understanding of the instructions.  The patient was advised to call back or seek an in-person evaluation if the symptoms worsen or if the condition fails to improve as anticipated."    INTERIM HX:  Gabapentin added for radicular pain down both legs last visit-->didn't help much.  Higher dose made her feel "outside of herself". Using two vicodin hs, uses advil or aleve daytime. Voltaren gel helping back pain. Afraid to take vicodin in daytime b/c she may have to drive and doesn't want to be impaired at all. Has insomnia most nights.  Takes some daytime naps. Has never been on amitriptyline for this or for neuralgia. She has never had ESI's.  Has never had back surgery. Her last PT course was about 2 yrs ago.  This did help.  No loss of b/b control.  No saddle anesthesia.  No focal leg/foot weakness.  Home bp's up and down: some normal and some 150s-160s/80s when in severe pain.  ROS: no fevers, no CP,no dizziness, no HAs, no rashes, no melena/hematochezia.  No polyuria or polydipsia.  No focal weakness, paresthesias, or tremors.  No acute vision or hearing abnormalities. No n/v/d or abd pain.  No palpitations.     Past Medical History:  Diagnosis Date  . Allergic rhinitis    Allergy testing: results pending as of 05/28/16 (Dr. Harold Hedge)  . Anemia   . Arthritis   . Chemotherapy-induced neuropathy ([place]) 2019  . Cough variant asthma   . DDD (degenerative disc disease), lumbar   . Diabetes mellitus with complication (HCC)    Mild nonprolif DR R eye 12/2017-->referred to retinal specialist DM MANAGED  BY DR. Cruzita Lederer AS OF 2021  . Endometrial cancer ([place]) 02/2018   REMISSION AS OF 08/2018.  Stage III (T3, Nx, Mx)  Path: endometroid adenocarcinoma involving cervix, uterus, and fallopian tubes (Pt got TAH & BSO 02/2018). No sign of recurrence as of onc f/u 05/2019.  Marland Kitchen GERD (gastroesophageal reflux disease)   . Hemorrhoids   . Herpes zoster 10/2015   L side belt-line  .  History of adenomatous polyp of colon 02/25/13   5 mm cecal polyp removed by Dr. Trevor Mace 5 yrs  . History of blood transfusion   . History of cellulitis 08/2010   Left (Since replacemnt of Left knee  . Hyperkalemia 03/2018   started after pt started getting chemo-->had to stop enalapril.  Marland Kitchen Hyperlipidemia    Not on statin b/c lipids "stayed down when sugars came down" per pt.  She says Dr. Chalmers Cater knows she is not on statin anymore.  . Hypertension   . Hypothyroidism   . Nephrolithiasis 4/07  . Obesity   . OSA on CPAP   . Osteoarthritis    knees, ankle, + ? scapholunate ligament disruption (x-ray 06/2017)--ortho referral.  . Pancytopenia due to antineoplastic chemotherapy ([place])    progressive as of 06/2018. Stable 08/2018.  Marland Kitchen Recurrent UTI    started cipro 250 qd 09/2018  . Sciatica of left side   . Severe persistent asthma    cough-variant--saw Allergist 05/25/16 and was switched from max dose advair to symbicort.  . Systolic murmur    ECHO fine 10/2016-->murmur likely flow murmur assoc with HTN.  Marland Kitchen Zoon's vulvitis    Bx-proven (GYN) -lichen sclerosis.  Clobetasol 0.05% ointment per GYN    Past Surgical History:  Procedure Laterality Date  . ANKLE FRACTURE SURGERY  1984   Pin & repair  . ARTHROSCOPIC REPAIR ACL  2000   Due to ACL tear  . ARTHROSCOPIC REPAIR ACL  5/08  . Blateral knee rerlacements x4 2 times each knee    . BREAST BIOPSY Right 2014   Benign  . CARDIAC CATHETERIZATION  5/07   clear vessel mild mitral   . CARPAL TUNNEL RELEASE Right [id number]   1989 Left  . CESAREAN SECTION  1985  . CHOLECYSTECTOMY OPEN  1988  . COLONOSCOPY N/A 02/25/2013   Tubular adenoma x 1: Recall 5 yrs. Procedure: COLONOSCOPY;  Surgeon: Juanita Craver, MD;  Location: [facility];  Service: Endoscopy;  Laterality: N/A;  . COMBINED HYSTEROSCOPY DIAGNOSTIC / D&C  2/12   Bx neg  . DEXA  08/2007   Bone density normal.  . DILATION AND CURETTAGE OF UTERUS  12/11  . IR IMAGING GUIDED PORT  INSERTION  03/16/2018  . IR REMOVAL TUN ACCESS W/ PORT W/O FL MOD SED  09/20/2018  . LYMPH NODE BIOPSY N/A 02/19/2018   Procedure: Sentinel LYMPH NODE BIOPSY;  Surgeon: Everitt Amber, MD;  Location: [facility];  Service: Gynecology;  Laterality: N/A;  . REPLACEMENT TOTAL KNEE Left 5/12   X 2 on each  . ROBOTIC ASSISTED TOTAL HYSTERECTOMY WITH BILATERAL SALPINGO OOPHERECTOMY N/A 02/19/2018   For endometrial cancer.  Procedure: XI ROBOTIC ASSISTED TOTAL HYSTERECTOMY WITH BILATERAL SALPINGO OOPHORECTOMY;  Surgeon: Everitt Amber, MD;  Location: [facility];  Service: Gynecology;  Laterality: N/A;  . TRANSTHORACIC ECHOCARDIOGRAM  10/11/2016    EF 55-60%, grd I DD.  . TUBAL LIGATION  1985   C-Section    Outpatient Medications Prior to Visit  Medication Sig Dispense Refill  . albuterol (  PROVENTIL) (2.5 MG/3ML) 0.083% nebulizer solution Take 3 mLs (2.5 mg total) by nebulization every 4 (four) hours as needed for wheezing or shortness of breath (Dx: J45.50). 75 mL 1  . albuterol (VENTOLIN HFA) 108 (90 Base) MCG/ACT inhaler INHALE 2 PUFFS INTO LUNGS EVERY 6 HOURS AS NEEDED FOR WHEEZING/SHORTNESS OF BREATH 18 g 1  . budesonide-formoterol (SYMBICORT) 160-4.5 MCG/ACT inhaler TAKE 2 PUFFS BY MOUTH TWICE A DAY 10.2 Inhaler 11  . clobetasol ointment (TEMOVATE) 7.82 % Apply 1 application topically at bedtime. Apply to the skin of the vulva for 12 weeks 30 g 2  . diclofenac Sodium (VOLTAREN) 1 % GEL Apply 2 g topically 4 (four) times daily. 100 g 3  . felodipine (PLENDIL) 10 MG 24 hr tablet TAKE 1 TABLET BY MOUTH EVERY DAY 90 tablet 0  . fluticasone (CUTIVATE) 0.05 % cream APPLY TO AFFECTED AREA OF R LOWER LEG TWICE PER DAY. 30 g 0  . fluticasone (FLONASE SENSIMIST) 27.5 MCG/SPRAY nasal spray Place 1 spray into the nose daily as needed for rhinitis.    . furosemide (LASIX) 20 MG tablet Take 1 tablet (20 mg total) by mouth daily. 90 tablet 0  . HYDROcodone-acetaminophen (NORCO/VICODIN)  5-325 MG tablet 1-2 tabs po bid prn pain 90 tablet 0  . insulin aspart (NOVOLOG) 100 UNIT/ML injection USE PER SLIDING SCALE AT EACH MEAL (AVERAGE USE IS 17 UNITS 3 TIMES DAILY) 10 mL 3  . insulin NPH Human (NOVOLIN N) 100 UNIT/ML injection INJECT 30 UNITS UNDER THE SKIN 3x a day 50 mL 3  . Insulin Syringes, Disposable, U-100 0.3 ML MISC Use to inject insulin--6 injections per day 540 each 3  . levocetirizine (XYZAL) 5 MG tablet Take 5 mg by mouth at bedtime as needed for allergies.   5  . levothyroxine (SYNTHROID) 125 MCG tablet 1 tab po qd 90 tablet 3  . metFORMIN (GLUCOPHAGE) 500 MG tablet Take 1 tablet by mouth every morning and 2 tablets by mouth at bedtime. OFFICE VISIT NEEDED 270 tablet 0  . miconazole (MICRO GUARD) 2 % powder Apply 1 application topically 2 (two) times daily as needed for itching.    . pravastatin (PRAVACHOL) 20 MG tablet Take 1 tablet (20 mg total) by mouth daily. 90 tablet 0  . Probiotic Product (PROBIOTIC PO) Take 1 capsule by mouth daily.    . meloxicam (MOBIC) 15 MG tablet TAKE 1 TABLET BY MOUTH EVERY DAY AS NEEDED 30 tablet 3  . gabapentin (NEURONTIN) 100 MG capsule 1-3 tabs po tid (Patient not taking: Reported on 12/18/2019) 90 capsule 1  . mupirocin ointment (BACTROBAN) 2 % Apply 1 application topically 3 (three) times daily. (Patient not taking: Reported on 12/18/2019) 15 g 1   No facility-administered medications prior to visit.    Allergies  Allergen Reactions  . Adhesive [Tape] Other (See Comments)    Takes patient's skin off.  . Banana Diarrhea and Nausea Only  . Eggs Or Egg-Derived Products Diarrhea and Nausea Only  . Latex Other (See Comments)    sensativity  . Penicillins Rash and Other (See Comments)    Has had cephalosporins without incident  . Pine Swelling and Rash  . Rose Swelling and Rash    ROS As per HPI  PE: Vitals with BMI 12/18/2019 11/13/2019 11/12/2019  Height - - 5\' 1"   Weight 271 lbs 3 oz 265 lbs 269 lbs 8 oz  BMI - 42.3 53.61   Systolic 443 154 008  Diastolic 78 65 53  Pulse 74 83 80   Gen: Alert, well appearing.  Patient is oriented to person, place, time, and situation. AFFECT: pleasant, lucid thought and speech. CV: RRR, no m/r/g.   LUNGS: CTA bilat, nonlabored resps, good aeration in all lung fields.  No further exam today.   LABS:  Lab Results  Component Value Date   TSH 1.88 10/05/2018   Lab Results  Component Value Date   WBC 10.1 01/21/2019   HGB 10.3 (L) 01/21/2019   HCT 31.5 (L) 01/21/2019   MCV 96.1 01/21/2019   PLT 223.0 01/21/2019  No results found for: IRON, TIBC, FERRITIN No results found for: VITAMINB12  Lab Results  Component Value Date   CREATININE 0.79 01/21/2019   BUN 24 (H) 01/21/2019   NA 140 01/21/2019   K 3.6 01/21/2019   CL 102 01/21/2019   CO2 27 01/21/2019   Lab Results  Component Value Date   ALT 15 10/05/2018   AST 20 10/05/2018   ALKPHOS 66 10/05/2018   BILITOT 0.4 10/05/2018   Lab Results  Component Value Date   CHOL 136 10/05/2018   Lab Results  Component Value Date   HDL 27.10 (L) 10/05/2018   Lab Results  Component Value Date   LDLCALC 75 02/13/2017   Lab Results  Component Value Date   TRIG 232.0 (H) 10/05/2018   Lab Results  Component Value Date   CHOLHDL 5 10/05/2018   Lab Results  Component Value Date   HGBA1C 6.6 (A) 05/07/2019    IMPRESSION AND PLAN:  1) Chronic pain syndrome: Lumbar DDD/spondylosis with bilat sciatica pain. Pain gradually worsening.  She is hesitant to take enough vicodin to make a signif difference except for helping with pain enough to initiate/maintain sleep. Did not tolerate gabapentin.  Ordered PT referral and will start amitriptyline 25mg  qhs today.  Therapeutic expectations and side effect profile of medication discussed today.  Patient's questions answered.  Continue voltaren gel for back since this helps.  Occ aleve or ibup ok.  No more meloxicam b/c no help. Renew CSC and get UDS today (most  recent vicodin was last night).  If not improved in 2 mo then refer to pain mgmt specialist for consideration of spinal stimulator trial OR Dr. Maia Petties or Ramos for consideration of ESI.  2) HTN: erratic control due to uncontrolled pain. No changes today: felodipine 10mg  qd.  An After Visit Summary was printed and given to the patient.  FOLLOW UP: Return in about 2 months (around 02/17/2020) for f/u LBP with radic.  Signed:  Crissie Sickles, MD           12/18/2019

## 2019-12-19 ENCOUNTER — Other Ambulatory Visit: Payer: Self-pay | Admitting: Family Medicine

## 2019-12-19 ENCOUNTER — Ambulatory Visit: Payer: Medicare Other | Admitting: Family Medicine

## 2019-12-19 MED ORDER — PRAVASTATIN SODIUM 40 MG PO TABS: 40.0000 mg | ORAL_TABLET | Freq: Every day | ORAL | 1 refills | Status: DC

## 2019-12-21 LAB — DRUG MONITORING, PANEL 8 WITH CONFIRMATION, URINE
6 Acetylmorphine: NEGATIVE ng/mL (ref <10)
Alcohol Metabolites: NEGATIVE ng/mL
Amphetamines: NEGATIVE ng/mL (ref <500)
Benzodiazepines: NEGATIVE ng/mL (ref <100)
Buprenorphine, Urine: NEGATIVE ng/mL (ref <5)
Cocaine Metabolite: NEGATIVE ng/mL (ref <150)
Codeine: NEGATIVE ng/mL (ref <50)
Creatinine: 98.6 mg/dL
Hydrocodone: 383 ng/mL — ABNORMAL HIGH (ref <50)
Hydromorphone: 160 ng/mL — ABNORMAL HIGH (ref <50)
MDMA: NEGATIVE ng/mL (ref <500)
Marijuana Metabolite: NEGATIVE ng/mL (ref <20)
Morphine: NEGATIVE ng/mL (ref <50)
Norhydrocodone: 547 ng/mL — ABNORMAL HIGH (ref <50)
Opiates: POSITIVE ng/mL — AB (ref <100)
Oxidant: NEGATIVE ug/mL
Oxycodone: NEGATIVE ng/mL (ref <100)
pH: 5.6 (ref 4.5–9.0)

## 2019-12-26 ENCOUNTER — Ambulatory Visit (INDEPENDENT_AMBULATORY_CARE_PROVIDER_SITE_OTHER): Payer: Medicare Other | Admitting: Internal Medicine

## 2019-12-26 ENCOUNTER — Encounter: Payer: Self-pay | Admitting: Internal Medicine

## 2019-12-26 ENCOUNTER — Other Ambulatory Visit: Payer: Self-pay

## 2019-12-26 VITALS — BP 138/60 | HR 90 | Wt 272.0 lb

## 2019-12-26 DIAGNOSIS — IMO0002 Reserved for concepts with insufficient information to code with codable children: Secondary | ICD-10-CM

## 2019-12-26 DIAGNOSIS — E785 Hyperlipidemia, unspecified: Secondary | ICD-10-CM

## 2019-12-26 DIAGNOSIS — E11319 Type 2 diabetes mellitus with unspecified diabetic retinopathy without macular edema: Secondary | ICD-10-CM

## 2019-12-26 DIAGNOSIS — E1165 Type 2 diabetes mellitus with hyperglycemia: Secondary | ICD-10-CM

## 2019-12-26 LAB — POCT GLYCOSYLATED HEMOGLOBIN (HGB A1C): Hemoglobin A1C: 7.7 % — AB (ref 4.0–5.6)

## 2019-12-26 MED ORDER — INSULIN ASPART 100 UNIT/ML ~~LOC~~ SOLN
SUBCUTANEOUS | 3 refills | Status: DC
Start: 2019-12-26 — End: 2021-01-06

## 2019-12-26 MED ORDER — INSULIN NPH (HUMAN) (ISOPHANE) 100 UNIT/ML ~~LOC~~ SUSP: SUBCUTANEOUS | 3 refills | Status: DC

## 2019-12-26 NOTE — Addendum Note (Signed)
Addended by: Cardell Peach I on: 12/26/2019 11:44 AM   Modules accepted: Orders

## 2019-12-26 NOTE — Progress Notes (Signed)
Patient ID: [name], female   DOB: [birth year], 69 y.o.   MRN: [id number]   This visit occurred during the SARS-CoV-2 public health emergency.  Safety protocols were in place, including screening questions prior to the visit, additional usage of staff PPE, and extensive cleaning of exam room while observing appropriate contact time as indicated for disinfecting solutions.   HPI: [name] is a 69 y.o.-year-old female, initially referred by her PCP, Dr. Ernestine Conrad, returning for follow-up for DM2, dx in 1992, insulin-dependent since 2004-2005, uncontrolled, with complications (DR).  Last visit 8 months ago. On [facility].  Reviewed HbA1c levels: Lab Results  Component Value Date   HGBA1C 6.6 (A) 05/07/2019   HGBA1C 8.8 (H) 01/21/2019   HGBA1C 7.5 (H) 10/05/2018   HGBA1C 7.0 (A) 05/18/2018   HGBA1C 7.1 (H) 02/15/2018   HGBA1C 7.3 (A) 01/03/2018   HGBA1C 9.4 (H) 10/04/2017   HGBA1C 8.7 (H) 06/28/2017   HGBA1C 7.9 (H) 02/13/2017   HGBA1C 10.2 Repeated and verified X2. (H) 09/06/2016   At last visit she was on: - Metformin (tablets) 500 mg in am and 1000 mg with dinner - NPH 52 units 3x a day - Novolog 12 (-18) units 3x a day, before meals She was on Lantus >> expensive.  We changed to: - Metformin 500 mg in am and 1000 mg with dinner - NPH 30 >> 40 units 3x a day - Novolog 10-14 >> 17-20 units 3x a day, before meals Tried Ozempic 0.5 mg weekly >> significant diarrhea, abd. Pain, food intolerance >> started 04/2019 - stopped 07/2019  Pt checks her sugars 0-1 times a day: - am: 80-140 >> 120-140 - 2h after b'fast: n/c - before lunch: n/c >> 120-140 - 2h after lunch: n/c - before dinner: n/c - 2h after dinner: n/c - bedtime: 50s, 115-120, 200 >> <140 - nighttime: n/c Lowest sugar was 50 >> 65-70 (more active); she has hypoglycemia awareness at 75.  Highest sugar was 300 - pizza >> 200.  Glucometer: [facility]  Pt's meals are: - Breakfast: oatmeal - instant, peacans, butter -  Lunch: tortilla + PB and J or [place] and cheese - Dinner: meat (hamburger, chicken, bratwurst), spaghetti, green beans, mixed veggies - Snacks:   -No CKD, last BUN/creatinine:  Lab Results  Component Value Date   BUN 25 (H) 12/18/2019   BUN 24 (H) 01/21/2019   CREATININE 0.84 12/18/2019   CREATININE 0.79 01/21/2019   -+ HL; last set of lipids: Lab Results  Component Value Date   CHOL 172 12/18/2019   HDL 39.60 12/18/2019   LDLCALC 99 12/18/2019   LDLDIRECT 78.0 10/05/2018   TRIG 164.0 (H) 12/18/2019   CHOLHDL 4 12/18/2019  On pravastatin 20.  - last eye exam was in 06/2017: + DR  -She has numbness and tingling in her feet -chemotherapy related.  Pt has FH of DM in daughter, brother, father, P aunts.  She also has a history of endometrial cancer - 2019, also, OSA, chemotherapy-induced peripheral neuropathy, hypothyroidism-on levothyroxine 125 mcg daily.  Latest TSH was normal: Lab Results  Component Value Date   TSH 2.88 12/18/2019   She lost a child at 78 y/o in 1990 >> she gained 100 lbs and was dx'ed with DM afterwards.  No h/o pancreatitis or FH of MTC.  ROS: Constitutional: no weight gain/+ weight loss, + fatigue, no subjective hyperthermia, no subjective hypothermia, + poor sleep Eyes: no blurry vision, no xerophthalmia ENT: no sore throat, no nodules palpated  in neck, no dysphagia, no odynophagia, no hoarseness Cardiovascular: no CP/no SOB/no palpitations/no leg swelling Respiratory: no cough/no SOB/no wheezing Gastrointestinal: no N/no V/no D/no C/no acid reflux Musculoskeletal: no muscle aches/+ joint aches Skin: no rashes, no hair loss Neurological: no tremors/+ numbness/+ tingling/no dizziness, + sciatica pain  I reviewed pt's medications, allergies, PMH, social hx, family hx, and changes were documented in the history of present illness. Otherwise, unchanged from my initial visit note.  Past Medical History:  Diagnosis Date  . Allergic rhinitis     Allergy testing: results pending as of 05/28/16 (Dr. Harold Hedge)  . Anemia   . Arthritis   . Chemotherapy-induced neuropathy ([place]) 2019  . Cough variant asthma   . DDD (degenerative disc disease), lumbar   . Diabetes mellitus with complication (HCC)    Mild nonprolif DR R eye 12/2017-->referred to retinal specialist DM MANAGED BY DR. Jai Bear AS OF 2021  . Endometrial cancer ([place]) 02/2018   REMISSION AS OF 08/2018.  Stage III (T3, Nx, Mx)  Path: endometroid adenocarcinoma involving cervix, uterus, and fallopian tubes (Pt got TAH & BSO 02/2018). No sign of recurrence as of onc f/u 05/2019.  Marland Kitchen GERD (gastroesophageal reflux disease)   . Hemorrhoids   . Herpes zoster 10/2015   L side belt-line  . History of adenomatous polyp of colon 02/25/13   5 mm cecal polyp removed by Dr. Trevor Mace 5 yrs  . History of blood transfusion   . History of cellulitis 08/2010   Left (Since replacemnt of Left knee  . Hyperkalemia 03/2018   started after pt started getting chemo-->had to stop enalapril.  Marland Kitchen Hyperlipidemia    Not on statin b/c lipids "stayed down when sugars came down" per pt.  She says Dr. Chalmers Cater knows she is not on statin anymore.  . Hypertension   . Hypothyroidism   . Nephrolithiasis 4/07  . Obesity   . OSA on CPAP   . Osteoarthritis    knees, ankle, + ? scapholunate ligament disruption (x-ray 06/2017)--ortho referral.  . Pancytopenia due to antineoplastic chemotherapy ([place])    progressive as of 06/2018. Stable 08/2018.  Marland Kitchen Recurrent UTI    started cipro 250 qd 09/2018  . Sciatica of left side   . Severe persistent asthma    cough-variant--saw Allergist 05/25/16 and was switched from max dose advair to symbicort.  . Systolic murmur    ECHO fine 10/2016-->murmur likely flow murmur assoc with HTN.  Marland Kitchen Zoon's vulvitis    Bx-proven (GYN) -lichen sclerosis.  Clobetasol 0.05% ointment per GYN   Past Surgical History:  Procedure Laterality Date  . ANKLE FRACTURE SURGERY  1984   Pin & repair  .  ARTHROSCOPIC REPAIR ACL  2000   Due to ACL tear  . ARTHROSCOPIC REPAIR ACL  5/08  . Blateral knee rerlacements x4 2 times each knee    . BREAST BIOPSY Right 2014   Benign  . CARDIAC CATHETERIZATION  5/07   clear vessel mild mitral   . CARPAL TUNNEL RELEASE Right [id number]   1989 Left  . CESAREAN SECTION  1985  . CHOLECYSTECTOMY OPEN  1988  . COLONOSCOPY N/A 02/25/2013   Tubular adenoma x 1: Recall 5 yrs. Procedure: COLONOSCOPY;  Surgeon: Juanita Craver, MD;  Location: [facility];  Service: Endoscopy;  Laterality: N/A;  . COMBINED HYSTEROSCOPY DIAGNOSTIC / D&C  2/12   Bx neg  . DEXA  08/2007   Bone density normal.  . DILATION AND CURETTAGE OF UTERUS  12/11  .  IR IMAGING GUIDED PORT INSERTION  03/16/2018  . IR REMOVAL TUN ACCESS W/ PORT W/O FL MOD SED  09/20/2018  . LYMPH NODE BIOPSY N/A 02/19/2018   Procedure: Sentinel LYMPH NODE BIOPSY;  Surgeon: Everitt Amber, MD;  Location: [facility];  Service: Gynecology;  Laterality: N/A;  . REPLACEMENT TOTAL KNEE Left 5/12   X 2 on each  . ROBOTIC ASSISTED TOTAL HYSTERECTOMY WITH BILATERAL SALPINGO OOPHERECTOMY N/A 02/19/2018   For endometrial cancer.  Procedure: XI ROBOTIC ASSISTED TOTAL HYSTERECTOMY WITH BILATERAL SALPINGO OOPHORECTOMY;  Surgeon: Everitt Amber, MD;  Location: [facility];  Service: Gynecology;  Laterality: N/A;  . TRANSTHORACIC ECHOCARDIOGRAM  10/11/2016    EF 55-60%, grd I DD.  . TUBAL LIGATION  1985   C-Section   Social History   Socioeconomic History  . Marital status: Married    Spouse name: Louie Casa  . Number of children: 2  . Years of education: Not on file  . Highest education level: Not on file  Occupational History  . Occupation: retired Therapist, sports  Tobacco Use  . Smoking status: Never Smoker  . Smokeless tobacco: Never Used  Vaping Use  . Vaping Use: Never used  Substance and Sexual Activity  . Alcohol use: No    Alcohol/week: 0.0 standard drinks  . Drug use: No  . Sexual activity:  Never    Partners: Male    Birth control/protection: I.U.D.  Other Topics Concern  . Not on file  Social History Narrative  . Not on file   Social Determinants of Health   Financial Resource Strain:   . Difficulty of Paying Living Expenses: Not on file  Food Insecurity:   . Worried About Charity fundraiser in the Last Year: Not on file  . Ran Out of Food in the Last Year: Not on file  Transportation Needs:   . Lack of Transportation (Medical): Not on file  . Lack of Transportation (Non-Medical): Not on file  Physical Activity:   . Days of Exercise per Week: Not on file  . Minutes of Exercise per Session: Not on file  Stress:   . Feeling of Stress : Not on file  Social Connections:   . Frequency of Communication with Friends and Family: Not on file  . Frequency of Social Gatherings with Friends and Family: Not on file  . Attends Religious Services: Not on file  . Active Member of Clubs or Organizations: Not on file  . Attends Archivist Meetings: Not on file  . Marital Status: Not on file  Intimate Partner Violence:   . Fear of Current or Ex-Partner: Not on file  . Emotionally Abused: Not on file  . Physically Abused: Not on file  . Sexually Abused: Not on file   Current Outpatient Medications on File Prior to Visit  Medication Sig Dispense Refill  . albuterol (PROVENTIL) (2.5 MG/3ML) 0.083% nebulizer solution Take 3 mLs (2.5 mg total) by nebulization every 4 (four) hours as needed for wheezing or shortness of breath (Dx: J45.50). 75 mL 1  . albuterol (VENTOLIN HFA) 108 (90 Base) MCG/ACT inhaler INHALE 2 PUFFS INTO LUNGS EVERY 6 HOURS AS NEEDED FOR WHEEZING/SHORTNESS OF BREATH 18 g 1  . amitriptyline (ELAVIL) 25 MG tablet Take 1 tablet (25 mg total) by mouth at bedtime. 30 tablet 1  . budesonide-formoterol (SYMBICORT) 160-4.5 MCG/ACT inhaler TAKE 2 PUFFS BY MOUTH TWICE A DAY 10.2 Inhaler 11  . clobetasol ointment (TEMOVATE) 4.08 % Apply 1 application  topically at  bedtime. Apply to the skin of the vulva for 12 weeks 30 g 2  . diclofenac Sodium (VOLTAREN) 1 % GEL Apply 2 g topically 4 (four) times daily. 100 g 3  . felodipine (PLENDIL) 10 MG 24 hr tablet TAKE 1 TABLET BY MOUTH EVERY DAY 90 tablet 0  . fluticasone (CUTIVATE) 0.05 % cream APPLY TO AFFECTED AREA OF R LOWER LEG TWICE PER DAY. 30 g 0  . fluticasone (FLONASE SENSIMIST) 27.5 MCG/SPRAY nasal spray Place 1 spray into the nose daily as needed for rhinitis.    . furosemide (LASIX) 20 MG tablet Take 1 tablet (20 mg total) by mouth daily. 90 tablet 0  . HYDROcodone-acetaminophen (NORCO/VICODIN) 5-325 MG tablet 1-2 tabs po bid prn pain 90 tablet 0  . insulin aspart (NOVOLOG) 100 UNIT/ML injection USE PER SLIDING SCALE AT EACH MEAL (AVERAGE USE IS 17 UNITS 3 TIMES DAILY) 10 mL 3  . insulin NPH Human (NOVOLIN N) 100 UNIT/ML injection INJECT 30 UNITS UNDER THE SKIN 3x a day 50 mL 3  . Insulin Syringes, Disposable, U-100 0.3 ML MISC Use to inject insulin--6 injections per day 540 each 3  . levocetirizine (XYZAL) 5 MG tablet Take 5 mg by mouth at bedtime as needed for allergies.   5  . levothyroxine (SYNTHROID) 125 MCG tablet 1 tab po qd 90 tablet 3  . metFORMIN (GLUCOPHAGE) 500 MG tablet Take 1 tablet by mouth every morning and 2 tablets by mouth at bedtime. OFFICE VISIT NEEDED 270 tablet 0  . miconazole (MICRO GUARD) 2 % powder Apply 1 application topically 2 (two) times daily as needed for itching.    . pravastatin (PRAVACHOL) 40 MG tablet Take 1 tablet (40 mg total) by mouth daily. 90 tablet 1  . Probiotic Product (PROBIOTIC PO) Take 1 capsule by mouth daily.    . [DISCONTINUED] prochlorperazine (COMPAZINE) 10 MG tablet Take 1 tablet (10 mg total) by mouth every 6 (six) hours as needed (Nausea or vomiting). 30 tablet 1   No current facility-administered medications on file prior to visit.   Allergies  Allergen Reactions  . Adhesive [Tape] Other (See Comments)    Takes patient's skin off.  . Banana  Diarrhea and Nausea Only  . Eggs Or Egg-Derived Products Diarrhea and Nausea Only  . Latex Other (See Comments)    sensativity  . Penicillins Rash and Other (See Comments)    Has had cephalosporins without incident  . Pine Swelling and Rash  . Rose Swelling and Rash   Family History  Problem Relation Age of Onset  . Diabetes Father   . COPD Father   . Stroke Father   . Celiac disease Brother   . Rheum arthritis Brother   . Thyroid disease Brother   . Diabetes Brother   . Alzheimer's disease Mother   . Arthritis Mother   . Other Son        Died age 98 -Tetrology of Fallot - VSD/pulmonary atresia  . Breast cancer Other        postmenopausal when diagnosed    PE: BP 138/60   Pulse 90   Wt 272 lb (123.4 kg)   LMP 08/10/2010 Comment: Hysterectomy 02/02/18  SpO2 96%   BMI 51.39 kg/m  Wt Readings from Last 3 Encounters:  12/26/19 272 lb (123.4 kg)  12/18/19 271 lb 3.2 oz (123 kg)  11/13/19 265 lb (120.2 kg)   Constitutional: overweight, in NAD, walks with a walker Eyes: PERRLA, EOMI, no exophthalmos  ENT: moist mucous membranes, no thyromegaly, no cervical lymphadenopathy Cardiovascular: RRR, No MRG Respiratory: CTA B Gastrointestinal: abdomen soft, NT, ND, BS+ Musculoskeletal: no deformities, strength intact in all 4 Skin: moist, warm, no rashes Neurological: no tremor with outstretched hands, DTR normal in all 4  ASSESSMENT: 1. DM2, insulin-dependent, uncontrolled, with complications - mild NP DR OD  2. HL  3.  Obesity class III  PLAN:  1. Patient with longstanding, uncontrolled, type 2 diabetes, on Metformin and injectable regimen with basal-bolus insulin and weekly GLP-1 receptor agonist, added at last visit.  At that time, HbA1c was lower, at 6.6%.  She did have frequent hypoglycemia episodes, especially later in the day but sometimes during the night, with the lowest being in the 50s.  She was on a high dose of NPH and we reduced this at last visit.  I also gave  her a more flexible regimen for NovoLog and we added Ozempic in an effort to be able to decrease her insulin doses even more and improved her weight, cardiovascular and kidney outcomes. -at this visit, she tells me she could not tolerate Ozempic 2/2 GI sxs - she increased the NPH since last OV but sugars remain close to goal but with hyperglycemic values occasionally. However, she is not checking sugars daily... Advised to check 3x a day, ideally - for now, will increase Novolog and decrease NPH doses - I suggested to:  Patient Instructions  Please continue: - Metformin 500 mg in am and 1000 mg with dinner  Please decrease: - NPH 30 units 3x a day  Please increase: - Novolog 20-25 units 3x a day, before meals  Please come back for a follow-up appointment in 3 months.  - we checked her HbA1c: 7.7% (higher) - advised to check sugars at different times of the day - 3x a day, rotating check times - advised for yearly eye exams >> she is not UTD - return to clinic in 3 months  2. HL - Reviewed latest lipid panel from 12/2019: LDL above target of less than 70, triglycerides slightly high: Lab Results  Component Value Date   CHOL 172 12/18/2019   HDL 39.60 12/18/2019   LDLCALC 99 12/18/2019   LDLDIRECT 78.0 10/05/2018   TRIG 164.0 (H) 12/18/2019   CHOLHDL 4 12/18/2019  - Continues pravastatin without side effects.  3.  Obesity class III -She lost 2 pounds since last visit -unfortunately, she could not tolerate Ozempic, which would have helped with weight loss   Philemon Kingdom, MD PhD [facility]

## 2019-12-26 NOTE — Patient Instructions (Signed)
Please continue: - Metformin 500 mg in am and 1000 mg with dinner  Please decrease: - NPH 30 units 3x a day  Please increase: - Novolog 20-25 units 3x a day, before meals  Please come back for a follow-up appointment in 3 months.

## 2020-01-01 ENCOUNTER — Encounter: Payer: Self-pay | Admitting: Physical Therapy

## 2020-01-01 ENCOUNTER — Ambulatory Visit: Payer: Medicare Other | Attending: Family Medicine | Admitting: Physical Therapy

## 2020-01-01 ENCOUNTER — Other Ambulatory Visit: Payer: Self-pay

## 2020-01-01 DIAGNOSIS — M5442 Lumbago with sciatica, left side: Secondary | ICD-10-CM | POA: Insufficient documentation

## 2020-01-01 DIAGNOSIS — R262 Difficulty in walking, not elsewhere classified: Secondary | ICD-10-CM | POA: Diagnosis not present

## 2020-01-01 DIAGNOSIS — M5441 Lumbago with sciatica, right side: Secondary | ICD-10-CM | POA: Diagnosis not present

## 2020-01-01 DIAGNOSIS — G8929 Other chronic pain: Secondary | ICD-10-CM | POA: Insufficient documentation

## 2020-01-01 DIAGNOSIS — M25552 Pain in left hip: Secondary | ICD-10-CM | POA: Insufficient documentation

## 2020-01-01 DIAGNOSIS — R2681 Unsteadiness on feet: Secondary | ICD-10-CM | POA: Insufficient documentation

## 2020-01-01 DIAGNOSIS — M25551 Pain in right hip: Secondary | ICD-10-CM | POA: Diagnosis not present

## 2020-01-01 NOTE — Therapy (Signed)
[facility] [address]  [place], [place], [address] Phone: [phone]   Fax:  [phone]  Physical Therapy Evaluation  Patient Details  Name: [name] MRN: [id number] Date of Birth: 1950-10-29 Referring Provider (PT): Shawnie Dapper, MD   Encounter Date: 01/01/2020   PT End of Session - 01/01/20 1722    Visit Number 1    Number of Visits 17    Date for PT Re-Evaluation 02/26/20    Authorization Type Medicare & Generic    PT Start Time 1453    PT Stop Time 1536    PT Time Calculation (min) 43 min    Activity Tolerance Patient tolerated treatment well;Patient limited by pain    Behavior During Therapy [facility] for tasks assessed/performed           Past Medical History:  Diagnosis Date  . Allergic rhinitis    Allergy testing: results pending as of 05/28/16 (Dr. Harold Hedge)  . Anemia   . Arthritis   . Chemotherapy-induced neuropathy ([place]) 2019  . Cough variant asthma   . DDD (degenerative disc disease), lumbar   . Diabetes mellitus with complication (HCC)    Mild nonprolif DR R eye 12/2017-->referred to retinal specialist DM MANAGED BY DR. GHERGHE AS OF 2021  . Endometrial cancer ([place]) 02/2018   REMISSION AS OF 08/2018.  Stage III (T3, Nx, Mx)  Path: endometroid adenocarcinoma involving cervix, uterus, and fallopian tubes (Pt got TAH & BSO 02/2018). No sign of recurrence as of onc f/u 05/2019.  Marland Kitchen GERD (gastroesophageal reflux disease)   . Hemorrhoids   . Herpes zoster 10/2015   L side belt-line  . History of adenomatous polyp of colon 02/25/13   5 mm cecal polyp removed by Dr. Trevor Mace 5 yrs  . History of blood transfusion   . History of cellulitis 08/2010   Left (Since replacemnt of Left knee  . Hyperkalemia 03/2018   started after pt started getting chemo-->had to stop enalapril.  Marland Kitchen Hyperlipidemia    Not on statin b/c lipids "stayed down when sugars came down" per pt.  She says Dr. Chalmers Cater knows she  is not on statin anymore.  . Hypertension   . Hypothyroidism   . Nephrolithiasis 4/07  . Obesity   . OSA on CPAP   . Osteoarthritis    knees, ankle, + ? scapholunate ligament disruption (x-ray 06/2017)--ortho referral.  . Pancytopenia due to antineoplastic chemotherapy ([place])    progressive as of 06/2018. Stable 08/2018.  Marland Kitchen Recurrent UTI    started cipro 250 qd 09/2018  . Sciatica of left side   . Severe persistent asthma    cough-variant--saw Allergist 05/25/16 and was switched from max dose advair to symbicort.  . Systolic murmur    ECHO fine 10/2016-->murmur likely flow murmur assoc with HTN.  Marland Kitchen Zoon's vulvitis    Bx-proven (GYN) -lichen sclerosis.  Clobetasol 0.05% ointment per GYN    Past Surgical History:  Procedure Laterality Date  . ANKLE FRACTURE SURGERY  1984   Pin & repair  . ARTHROSCOPIC REPAIR ACL  2000   Due to ACL tear  . ARTHROSCOPIC REPAIR ACL  5/08  . Blateral knee rerlacements x4 2 times each knee    . BREAST BIOPSY Right 2014   Benign  . CARDIAC CATHETERIZATION  5/07   clear vessel mild mitral   . CARPAL TUNNEL RELEASE Right [id number]   1989 Left  . CESAREAN SECTION  1985  .  CHOLECYSTECTOMY OPEN  1988  . COLONOSCOPY N/A 02/25/2013   Tubular adenoma x 1: Recall 5 yrs. Procedure: COLONOSCOPY;  Surgeon: Juanita Craver, MD;  Location: [facility];  Service: Endoscopy;  Laterality: N/A;  . COMBINED HYSTEROSCOPY DIAGNOSTIC / D&C  2/12   Bx neg  . DEXA  08/2007   Bone density normal.  . DILATION AND CURETTAGE OF UTERUS  12/11  . IR IMAGING GUIDED PORT INSERTION  03/16/2018  . IR REMOVAL TUN ACCESS W/ PORT W/O FL MOD SED  09/20/2018  . LYMPH NODE BIOPSY N/A 02/19/2018   Procedure: Sentinel LYMPH NODE BIOPSY;  Surgeon: Everitt Amber, MD;  Location: [facility];  Service: Gynecology;  Laterality: N/A;  . REPLACEMENT TOTAL KNEE Left 5/12   X 2 on each  . ROBOTIC ASSISTED TOTAL HYSTERECTOMY WITH BILATERAL SALPINGO OOPHERECTOMY N/A 02/19/2018   For  endometrial cancer.  Procedure: XI ROBOTIC ASSISTED TOTAL HYSTERECTOMY WITH BILATERAL SALPINGO OOPHORECTOMY;  Surgeon: Everitt Amber, MD;  Location: [facility];  Service: Gynecology;  Laterality: N/A;  . TRANSTHORACIC ECHOCARDIOGRAM  10/11/2016    EF 55-60%, grd I DD.  . TUBAL LIGATION  1985   C-Section    There were no vitals filed for this visit.    Subjective Assessment - 01/01/20 1455    Subjective Patient reports that she has had sciatica in her hip for several years, with recent increase in pain. Also noting onset of R sided sciatica. Pain is located across the B LB. Notes radiation of pain down either LE down to the knee. Denies N/T down LEs. Denies recent changes in B&B. Pain worse when walking, better with Aleve and Hydrocodone. Also has R foot neuropathy and burning pain after chemo. Was diagnosed with endometrial CA- treated with chemo. Since chemo, has been having intermittent episodes of diarrhea with fecal and urinary incontinence. Has been ambulating with SPC and walker in the house intermittently. Using w/c when outside d/t pain levels. Reports several close calls with imbalance d/t R foot neuropathy and having lots of extra furniture in her house, causing inability to maneuver her walker through the house. Notes that she would like to work on her fatigue levels and ability to navigate stairs up to the 2nd floor to take a shower.    Patient is accompained by: Family member   daughter   Pertinent History anemia, DDD, DM, L foot fx and pinning, GERD, HLD, HTN, B TKA, OA R foot and wrist, asthma, systolic murmur, ACL repair, L sciatica, hypothyroidism, endometrial CA in remission 08/2018, chemo-induced neuropathy    Limitations Sitting;Lifting;Standing;Walking;House hold activities    How long can you walk comfortably? <5 minutes    Diagnostic tests none recent    Patient Stated Goals improve pain    Currently in Pain? Yes    Pain Score 4     Pain Location Back    Pain  Orientation Right;Left;Lower    Pain Descriptors / Indicators Aching    Pain Type Chronic pain              [facility] PT Assessment - 01/01/20 1503      Assessment   Medical Diagnosis Chronic bilateral LBP with B sciatica, lumbat spondylosis, lumbar DDD    Referring Provider (PT) Shawnie Dapper, MD    Onset Date/Surgical Date --   chronic of several years   Next MD Visit not unsure    Prior Therapy yes- for LBP      Precautions   Precautions Fall  Precaution Comments endometrial CA in remission- no TENs/US unless cleared by MD      Balance Screen   Has the patient fallen in the past 6 months No   notes several close calls   Has the patient had a decrease in activity level because of a fear of falling?  No    Is the patient reluctant to leave their home because of a fear of falling?  No      Home Social worker Private residence    Living Arrangements Spouse/significant other;Children    Available Help at Discharge Family    Type of [place] to enter    Entrance Stairs-Number of Steps 1    Entrance Stairs-Rails None    Home Layout Two level    Alternate Level Stairs-Number of Steps 17    Alternate Level Stairs-Rails Right    Home Equipment Wheelchair - manual;Cane - quad;Walker - 4 wheels;Bedside commode;Shower seat      Prior Function   Level of Independence Independent with household mobility with device;Independent with basic ADLs    Vocation Retired    Warehouse manager   Overall Cognitive Status Within Functional Limits for tasks assessed      Observation/Other Assessments   Observations bruising over B dorsal forearms      Sensation   Light Touch Impaired by gross assessment   c/o decreased sensation over R LE     Coordination   Gross Motor Movements are Fluid and Coordinated Yes      Posture/Postural Control   Posture/Postural Control Postural limitations    Postural Limitations Posterior pelvic  tilt;Increased thoracic kyphosis   in sitting     ROM / Strength   AROM / PROM / Strength Strength      Strength   Strength Assessment Site Hip;Knee;Ankle    Right/Left Hip Right;Left    Right Hip Flexion 4+/5    Right Hip ABduction 4/5   pain in buttock   Right Hip ADduction 4/5    Left Hip Flexion 4+/5    Left Hip ABduction 4+/5    Left Hip ADduction 4+/5    Right/Left Knee Right;Left    Right Knee Flexion 4/5    Right Knee Extension 4+/5    Left Knee Flexion 4+/5    Left Knee Extension 4+/5    Right/Left Ankle Right;Left    Right Ankle Dorsiflexion 4/5    Right Ankle Plantar Flexion 4/5    Left Ankle Dorsiflexion 4+/5    Left Ankle Plantar Flexion 4+/5      Ambulation/Gait   Assistive device Rolling walker    Gait Pattern Step-to pattern;Step-through pattern;Decreased step length - right;Decreased step length - left;Trunk flexed    Ambulation Surface Level;Indoor    Gait velocity decreased      Standardized Balance Assessment   Standardized Balance Assessment Timed Up and Go Test      Timed Up and Go Test   Normal TUG (seconds) 36.73   with RW                     Objective measurements completed on examination: See above findings.               PT Education - 01/01/20 1722    Education Details prognosis, POC, HEP; administered contact information for [facility]    Person(s) Educated Patient    Methods Explanation;Demonstration;Tactile  cues;Verbal cues;Handout    Comprehension Verbalized understanding;Returned demonstration            PT Short Term Goals - 01/01/20 1733      PT SHORT TERM GOAL #1   Title Pt will be independent with HEP     Time 3    Period Weeks    Status New    Target Date 01/22/20             PT Long Term Goals - 01/01/20 1733      PT LONG TERM GOAL #1   Title Patient to be independent with advanced HEP.    Time 8    Period Weeks    Status New    Target Date 02/26/20      PT LONG TERM  GOAL #2   Title Patient to demsontrate lumbar AROM in all planes with <5/10 pain.    Time 8    Period Weeks    Status New    Target Date 02/26/20      PT LONG TERM GOAL #3   Title Patient to score <20 sec on TUG with LRAD to decrease risk of falls.    Time 8    Period Weeks    Status New    Target Date 02/26/20      PT LONG TERM GOAL #4   Title Patient to demonstrate B LE strength >/=4+/5.    Time 8    Period Weeks    Status New    Target Date 02/26/20      PT LONG TERM GOAL #5   Title Patient to report tolerance for navigating upstairs to access her bathroom without assistance.    Time 8    Period Weeks    Status New    Target Date 02/26/20      Additional Long Term Goals   Additional Long Term Goals Yes      PT LONG TERM GOAL #6   Title Patient to score 48/56 on [place] to demonstrate decreased risk of falls.    Time 8    Period Weeks    Status New    Target Date 02/26/20                  Plan - 01/01/20 1726    Clinical Impression Statement Patient is a 69y/o F presenting to OPPT with c/o acute on chronic exacerbation of B LB and B hip pain with radiation down B LEs to knees. Denies N/T down LEs or recent changes in B&B. Pain worse when walking, better with Aleve and Hydrocodone. Patient today presenting in w/c but reports ambulating with [facility] and 4WW in the home. Admits to several instances of LOB but denies falls d/t R foot neuropathy from chemotherapy for endometrial CA. Since chemo, patient also reporting considerable fatigue and difficulty navigating her home. Patient today presenting with rounded and slouched posture, decreased R LE strength, decreased R LE sensation, and gait deviations. Patient's TUG time indicates an increased risk of falls. Educated patient on gentle stretching and strengthening HEP as well as importance of exercise for cancer-related fatigue. Patient reported understanding. Would benefit from skilled PT services 2x/week for 8 weeks to  address aforementioned impairments.    Personal Factors and Comorbidities Age;Comorbidity 3+;Fitness;Past/Current Experience;Profession;Time since onset of injury/illness/exacerbation    Comorbidities anemia, DDD, DM, L foot fx and pinning, GERD, HLD, HTN, B TKA, OA R foot and wrist, asthma, systolic murmur, ACL repair, L sciatica, hypothyroidism, endometrial CA in  remission 08/2018, chemo-induced neuropathy    Examination-Activity Limitations Bathing;Sit;Sleep;Bed Mobility;Bend;Squat;Stairs;Caring for Others;Carry;Stand;Toileting;Dressing;Transfers;Hygiene/Grooming;Lift;Locomotion Level;Reach Overhead    Examination-Participation Restrictions Church;Cleaning;School;Community Activity;Shop;Laundry;Meal Prep    Stability/Clinical Decision Making Stable/Uncomplicated    Clinical Decision Making Low    Rehab Potential Good    PT Frequency 2x / week    PT Duration 8 weeks    PT Treatment/Interventions ADLs/Self Care Home Management;Cryotherapy;Electrical Stimulation;Moist Heat;Balance training;Therapeutic exercise;Therapeutic activities;Functional mobility training;Stair training;Gait training;DME Instruction;Neuromuscular re-education;Patient/family education;Manual techniques;Taping;Energy conservation;Dry needling;Passive range of motion    PT Next Visit Plan lumbar FOTO, palpation B hips and LB for pain, assess lumbar AROM    Consulted and Agree with Plan of Care Patient           Patient will benefit from skilled therapeutic intervention in order to improve the following deficits and impairments:  Decreased endurance, Decreased activity tolerance, Decreased strength, Increased fascial restricitons, Pain, Decreased balance, Decreased mobility, Difficulty walking, Increased muscle spasms, Improper body mechanics, Decreased range of motion, Impaired flexibility, Postural dysfunction  Visit Diagnosis: Chronic bilateral low back pain with bilateral sciatica  Pain in left hip  Pain in right  hip  Difficulty in walking, not elsewhere classified  Unsteadiness on feet     Problem List Patient Active Problem List   Diagnosis Date Noted  . Type 2 diabetes, uncontrolled, with retinopathy ([place]) 05/07/2019  . Pancytopenia, acquired ([place]) 04/30/2018  . Bilateral edema of lower extremity 04/09/2018  . Peripheral neuropathy due to chemotherapy ([place]) 04/09/2018  . Endometrial cancer ([place]) 02/19/2018  . Morbid obesity ([place]) 02/19/2018  . Chronic pain 11/03/2017  . Scapholunate ligament injury, no instability 06/29/2017  . HTN (hypertension) 09/27/2016  . Dyspnea 09/27/2016  . Shingles outbreak 10/20/2015  . 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  . Hypothyroidism 07/10/2015  . OSA (obstructive sleep apnea) 07/10/2015  . Endometrial hyperplasia 07/02/2015  . Bronchitis 06/01/2015  . Cough variant asthma 11/02/2014  . Obesity      Janene Harvey, PT, DPT 01/01/20 5:42 PM   [facility] [address]  [place], [place], [address] Phone: [phone]   Fax:  [phone]  Name: [name] MRN: [id number] Date of Birth: [birth year]

## 2020-01-07 ENCOUNTER — Ambulatory Visit: Payer: Medicare Other | Admitting: Physical Therapy

## 2020-01-07 ENCOUNTER — Other Ambulatory Visit: Payer: Self-pay

## 2020-01-07 ENCOUNTER — Encounter: Payer: Self-pay | Admitting: Physical Therapy

## 2020-01-07 DIAGNOSIS — M25552 Pain in left hip: Secondary | ICD-10-CM | POA: Diagnosis not present

## 2020-01-07 DIAGNOSIS — M5442 Lumbago with sciatica, left side: Secondary | ICD-10-CM | POA: Diagnosis not present

## 2020-01-07 DIAGNOSIS — M25551 Pain in right hip: Secondary | ICD-10-CM

## 2020-01-07 DIAGNOSIS — R262 Difficulty in walking, not elsewhere classified: Secondary | ICD-10-CM | POA: Diagnosis not present

## 2020-01-07 DIAGNOSIS — M5441 Lumbago with sciatica, right side: Secondary | ICD-10-CM | POA: Diagnosis not present

## 2020-01-07 DIAGNOSIS — R2681 Unsteadiness on feet: Secondary | ICD-10-CM

## 2020-01-07 DIAGNOSIS — G8929 Other chronic pain: Secondary | ICD-10-CM | POA: Diagnosis not present

## 2020-01-07 NOTE — Therapy (Signed)
[facility] [address]  [place], [place], [address] Phone: [phone]   Fax:  [phone]  Physical Therapy Treatment  Patient Details  Name: [name] MRN: [id number] Date of Birth: [birth year] Referring Provider (PT): Shawnie Dapper, MD   Encounter Date: 01/07/2020   PT End of Session - 01/07/20 1608    Visit Number 2    Number of Visits 17    Date for PT Re-Evaluation 02/26/20    Authorization Type Medicare & Generic    PT Start Time 1418   pt late   PT Stop Time 1452    PT Time Calculation (min) 34 min    Activity Tolerance Patient tolerated treatment well;Patient limited by pain    Behavior During Therapy [facility] for tasks assessed/performed           Past Medical History:  Diagnosis Date   Allergic rhinitis    Allergy testing: results pending as of 05/28/16 (Dr. Harold Hedge)   Anemia    Arthritis    Chemotherapy-induced neuropathy ([place]) 2019   Cough variant asthma    DDD (degenerative disc disease), lumbar    Diabetes mellitus with complication ([place])    Mild nonprolif DR R eye 12/2017-->referred to retinal specialist DM MANAGED BY DR. GHERGHE AS OF 2021   Endometrial cancer ([place]) 02/2018   REMISSION AS OF 08/2018.  Stage III (T3, Nx, Mx)  Path: endometroid adenocarcinoma involving cervix, uterus, and fallopian tubes (Pt got TAH & BSO 02/2018). No sign of recurrence as of onc f/u 05/2019.   GERD (gastroesophageal reflux disease)    Hemorrhoids    Herpes zoster 10/2015   L side belt-line   History of adenomatous polyp of colon 02/25/13   5 mm cecal polyp removed by Dr. Trevor Mace 5 yrs   History of blood transfusion    History of cellulitis 08/2010   Left (Since replacemnt of Left knee   Hyperkalemia 03/2018   started after pt started getting chemo-->had to stop enalapril.   Hyperlipidemia    Not on statin b/c lipids "stayed down when sugars came down" per pt.  She says Dr. Chalmers Cater  knows she is not on statin anymore.   Hypertension    Hypothyroidism    Nephrolithiasis 4/07   Obesity    OSA on CPAP    Osteoarthritis    knees, ankle, + ? scapholunate ligament disruption (x-ray 06/2017)--ortho referral.   Pancytopenia due to antineoplastic chemotherapy ([place])    progressive as of 06/2018. Stable 08/2018.   Recurrent UTI    started cipro 250 qd 09/2018   Sciatica of left side    Severe persistent asthma    cough-variant--saw Allergist 05/25/16 and was switched from max dose advair to symbicort.   Systolic murmur    ECHO fine 10/2016-->murmur likely flow murmur assoc with HTN.   Zoon's vulvitis    Bx-proven (GYN) -lichen sclerosis.  Clobetasol 0.05% ointment per GYN    Past Surgical History:  Procedure Laterality Date   ANKLE FRACTURE SURGERY  1984   Pin & repair   ARTHROSCOPIC REPAIR ACL  2000   Due to ACL tear   ARTHROSCOPIC REPAIR ACL  5/08   Blateral knee rerlacements x4 2 times each knee     BREAST BIOPSY Right 2014   Benign   CARDIAC CATHETERIZATION  5/07   clear vessel mild mitral    CARPAL TUNNEL RELEASE Right [id number]   1989 Left   CESAREAN SECTION  [place]   COLONOSCOPY N/A 02/25/2013   Tubular adenoma x 1: Recall 5 yrs. Procedure: COLONOSCOPY;  Surgeon: Juanita Craver, MD;  Location: [facility];  Service: Endoscopy;  Laterality: N/A;   COMBINED HYSTEROSCOPY DIAGNOSTIC / D&C  2/12   Bx neg   DEXA  08/2007   Bone density normal.   DILATION AND CURETTAGE OF UTERUS  12/11   IR IMAGING GUIDED PORT INSERTION  03/16/2018   IR REMOVAL TUN ACCESS W/ PORT W/O FL MOD SED  09/20/2018   LYMPH NODE BIOPSY N/A 02/19/2018   Procedure: Sentinel LYMPH NODE BIOPSY;  Surgeon: Everitt Amber, MD;  Location: [facility];  Service: Gynecology;  Laterality: N/A;   REPLACEMENT TOTAL KNEE Left 5/12   X 2 on each   ROBOTIC ASSISTED TOTAL HYSTERECTOMY WITH BILATERAL SALPINGO OOPHERECTOMY N/A 02/19/2018    For endometrial cancer.  Procedure: XI ROBOTIC ASSISTED TOTAL HYSTERECTOMY WITH BILATERAL SALPINGO OOPHORECTOMY;  Surgeon: Everitt Amber, MD;  Location: [facility];  Service: Gynecology;  Laterality: N/A;   TRANSTHORACIC ECHOCARDIOGRAM  10/11/2016    EF 55-60%, grd I DD.   TUBAL LIGATION  1985   C-Section    There were no vitals filed for this visit.   Subjective Assessment - 01/07/20 1419    Subjective In a lot of pain today d/t the weather. The marchign exercise is giving her some pain, otherwise is able to perform other exercises okay.    Pertinent History anemia, DDD, DM, L foot fx and pinning, GERD, HLD, HTN, B TKA, OA R foot and wrist, asthma, systolic murmur, ACL repair, L sciatica, hypothyroidism, endometrial CA in remission 08/2018, chemo-induced neuropathy    Diagnostic tests none recent    Patient Stated Goals improve pain    Currently in Pain? Yes    Pain Score 8     Pain Location Back    Pain Orientation Right;Left;Lower    Pain Descriptors / Indicators Aching    Pain Type Acute pain    Pain Radiating Towards R buttock              [facility] PT Assessment - 01/07/20 0001      Observation/Other Assessments   Focus on Therapeutic Outcomes (FOTO)  Lumbar: 28 (72% limited, 54% predicted)      Palpation   Palpation comment significant soft tissue restriction throughout posterior chain- TTP along B proximal glutes and L piriformis                         [facility] Adult PT Treatment/Exercise - 01/07/20 0001      Exercises   Exercises Knee/Hip      Knee/Hip Exercises: Stretches   Other Knee/Hip Stretches R/L modified hip ER stretch 30" each   folded pillow under knee     Knee/Hip Exercises: Seated   Other Seated Knee/Hip Exercises ab set with beach ball in lap 5x5"    Marching Strengthening;Both;2 sets;10 reps    Marching Limitations red loop around toes    Sit to Sand 1 set;5 reps;with UE support   cues for slow eccentric descent                   PT Education - 01/07/20 1607    Education Details update to HEP    Person(s) Educated Patient    Methods Explanation;Demonstration;Tactile cues;Verbal cues;Handout    Comprehension Verbalized understanding;Returned demonstration  PT Short Term Goals - 01/07/20 1612      PT SHORT TERM GOAL #1   Title Pt will be independent with HEP     Time 3    Period Weeks    Status On-going    Target Date 01/22/20             PT Long Term Goals - 01/07/20 1612      PT LONG TERM GOAL #1   Title Patient to be independent with advanced HEP.    Time 8    Period Weeks    Status On-going      PT LONG TERM GOAL #2   Title Patient to demsontrate lumbar AROM in all planes with <5/10 pain.    Time 8    Period Weeks    Status On-going      PT LONG TERM GOAL #3   Title Patient to score <20 sec on TUG with LRAD to decrease risk of falls.    Time 8    Period Weeks    Status On-going      PT LONG TERM GOAL #4   Title Patient to demonstrate B LE strength >/=4+/5.    Time 8    Period Weeks    Status On-going      PT LONG TERM GOAL #5   Title Patient to report tolerance for navigating upstairs to access her bathroom without assistance.    Time 8    Period Weeks    Status On-going      PT LONG TERM GOAL #6   Title Patient to score 48/56 on Berg to demonstrate decreased risk of falls.    Time 8    Period Weeks    Status On-going                 Plan - 01/07/20 1608    Clinical Impression Statement Patient reporting increased LBP today d/t the weather. Also noting pain when performing standing marching as part of HEP. Initiated sitting marching with banded resistance with patient reporting improvement in tolerance. Able to perform STS with cueing to decrease eccentric speed for improved muscle activation. Palpated posterior chain, with patient demonstrating significant soft tissue restriction throughout, and with most TTP occurring over B proximal  glutes and L piriformis. Initiated modified hip ER stretch for hopeful improvement in hip mobility and pain. Patient tolerated this without c/o increased pain, thus this was added into HEP. Patient reported understanding of HEP update and without complaints at end of session. Plan progressing per patients tolerance.    Comorbidities anemia, DDD, DM, L foot fx and pinning, GERD, HLD, HTN, B TKA, OA R foot and wrist, asthma, systolic murmur, ACL repair, L sciatica, hypothyroidism, endometrial CA in remission 08/2018, chemo-induced neuropathy    PT Treatment/Interventions ADLs/Self Care Home Management;Cryotherapy;Electrical Stimulation;Moist Heat;Balance training;Therapeutic exercise;Therapeutic activities;Functional mobility training;Stair training;Gait training;DME Instruction;Neuromuscular re-education;Patient/family education;Manual techniques;Taping;Energy conservation;Dry needling;Passive range of motion    PT Next Visit Plan assess lumbar AROM; progress sitting/standing LE stretching and strengthening to tolerance    Consulted and Agree with Plan of Care Patient           Patient will benefit from skilled therapeutic intervention in order to improve the following deficits and impairments:  Decreased endurance, Decreased activity tolerance, Decreased strength, Increased fascial restricitons, Pain, Decreased balance, Decreased mobility, Difficulty walking, Increased muscle spasms, Improper body mechanics, Decreased range of motion, Impaired flexibility, Postural dysfunction  Visit Diagnosis: Chronic bilateral low back pain with bilateral sciatica  Pain in left hip  Pain in right hip  Difficulty in walking, not elsewhere classified  Unsteadiness on feet     Problem List Patient Active Problem List   Diagnosis Date Noted   Type 2 diabetes, uncontrolled, with retinopathy ([place]) 05/07/2019   Pancytopenia, acquired ([place]) 04/30/2018   Bilateral edema of lower extremity 04/09/2018    Peripheral neuropathy due to chemotherapy ([place]) 04/09/2018   Endometrial cancer ([place]) 02/19/2018   Morbid obesity ([place]) 02/19/2018   Chronic pain 11/03/2017   Scapholunate ligament injury, no instability 06/29/2017   HTN (hypertension) 09/27/2016   Dyspnea 09/27/2016   Shingles outbreak 10/20/2015   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   Hypothyroidism 07/10/2015   OSA (obstructive sleep apnea) 07/10/2015   Endometrial hyperplasia 07/02/2015   Bronchitis 06/01/2015   Cough variant asthma 11/02/2014   Obesity      Janene Harvey, PT, DPT 01/07/20 4:13 PM   [facility] [address]  [place], [place], [address] Phone: [phone]   Fax:  [phone]  Name: [name] MRN: [id number] Date of Birth: 02-24-1951

## 2020-01-08 ENCOUNTER — Other Ambulatory Visit: Payer: Self-pay | Admitting: Family Medicine

## 2020-01-09 ENCOUNTER — Other Ambulatory Visit: Payer: Self-pay | Admitting: Family Medicine

## 2020-01-10 ENCOUNTER — Other Ambulatory Visit: Payer: Self-pay | Admitting: Internal Medicine

## 2020-01-10 ENCOUNTER — Other Ambulatory Visit: Payer: Self-pay | Admitting: Family Medicine

## 2020-01-13 NOTE — Telephone Encounter (Signed)
Alternative Requested:INSURANCE WILL NOT PAY FOR HUMULIN N. Please advise

## 2020-01-14 ENCOUNTER — Ambulatory Visit: Payer: Medicare Other | Attending: Family Medicine

## 2020-01-14 ENCOUNTER — Other Ambulatory Visit: Payer: Self-pay

## 2020-01-14 DIAGNOSIS — R2681 Unsteadiness on feet: Secondary | ICD-10-CM | POA: Diagnosis not present

## 2020-01-14 DIAGNOSIS — G8929 Other chronic pain: Secondary | ICD-10-CM | POA: Insufficient documentation

## 2020-01-14 DIAGNOSIS — M25551 Pain in right hip: Secondary | ICD-10-CM | POA: Insufficient documentation

## 2020-01-14 DIAGNOSIS — M25552 Pain in left hip: Secondary | ICD-10-CM | POA: Diagnosis not present

## 2020-01-14 DIAGNOSIS — M5442 Lumbago with sciatica, left side: Secondary | ICD-10-CM | POA: Diagnosis not present

## 2020-01-14 DIAGNOSIS — R262 Difficulty in walking, not elsewhere classified: Secondary | ICD-10-CM | POA: Diagnosis not present

## 2020-01-14 DIAGNOSIS — M5441 Lumbago with sciatica, right side: Secondary | ICD-10-CM | POA: Diagnosis not present

## 2020-01-14 NOTE — Therapy (Signed)
[facility] [address]  [place], [place], [address] Phone: [phone]   Fax:  [phone]  Physical Therapy Treatment  Patient Details  Name: [name] MRN: [id number] Date of Birth: 08/29/1950 Referring Provider (PT): Shawnie Dapper, MD   Encounter Date: 01/14/2020   PT End of Session - 01/14/20 1415    Visit Number 3    Number of Visits 17    Date for PT Re-Evaluation 02/26/20    Authorization Type Medicare & Generic    PT Start Time 1407    PT Stop Time 1447    PT Time Calculation (min) 40 min    Activity Tolerance Patient tolerated treatment well;Patient limited by pain    Behavior During Therapy [facility] for tasks assessed/performed           Past Medical History:  Diagnosis Date  . Allergic rhinitis    Allergy testing: results pending as of 05/28/16 (Dr. Harold Hedge)  . Anemia   . Arthritis   . Chemotherapy-induced neuropathy ([place]) 2019  . Cough variant asthma   . DDD (degenerative disc disease), lumbar   . Diabetes mellitus with complication ([facility])    Mild nonprolif DR R eye 12/2017-->referred to retinal specialist DM MANAGED BY DR. GHERGHE AS OF 2021  . Endometrial cancer ([place]) 02/2018   REMISSION AS OF 08/2018.  Stage III (T3, Nx, Mx)  Path: endometroid adenocarcinoma involving cervix, uterus, and fallopian tubes (Pt got TAH & BSO 02/2018). No sign of recurrence as of onc f/u 05/2019.  [place] GERD (gastroesophageal reflux disease)   . Hemorrhoids   . Herpes zoster 10/2015   L side belt-line  . History of adenomatous polyp of colon 02/25/13   5 mm cecal polyp removed by Dr. Trevor Mace 5 yrs  . History of blood transfusion   . History of cellulitis 08/2010   Left (Since replacemnt of Left knee  . Hyperkalemia 03/2018   started after pt started getting chemo-->had to stop enalapril.  [place] Hyperlipidemia    Not on statin b/c lipids "stayed down when sugars came down" per pt.  She says Dr. Chalmers Cater knows she  is not on statin anymore.  . Hypertension   . Hypothyroidism   . Nephrolithiasis 4/07  . Obesity   . OSA on CPAP   . Osteoarthritis    knees, ankle, + ? scapholunate ligament disruption (x-ray 06/2017)--ortho referral.  . Pancytopenia due to antineoplastic chemotherapy ([place])    progressive as of 06/2018. Stable 08/2018.  [place] Recurrent UTI    started cipro 250 qd 09/2018  . Sciatica of left side   . Severe persistent asthma    cough-variant--saw Allergist 05/25/16 and was switched from max dose advair to symbicort.  . Systolic murmur    ECHO fine 10/2016-->murmur likely flow murmur assoc with HTN.  [place] Zoon's vulvitis    Bx-proven (GYN) -lichen sclerosis.  Clobetasol 0.05% ointment per GYN    Past Surgical History:  Procedure Laterality Date  . ANKLE FRACTURE SURGERY  1984   Pin & repair  . ARTHROSCOPIC REPAIR ACL  2000   Due to ACL tear  . ARTHROSCOPIC REPAIR ACL  5/08  . Blateral knee rerlacements x4 2 times each knee    . BREAST BIOPSY Right 2014   Benign  . CARDIAC CATHETERIZATION  5/07   clear vessel mild mitral   . CARPAL TUNNEL RELEASE Right [id number]   1989 Left  . CESAREAN SECTION  1985  .  CHOLECYSTECTOMY OPEN  1988  . COLONOSCOPY N/A 02/25/2013   Tubular adenoma x 1: Recall 5 yrs. Procedure: COLONOSCOPY;  Surgeon: Juanita Craver, MD;  Location: [facility];  Service: Endoscopy;  Laterality: N/A;  . COMBINED HYSTEROSCOPY DIAGNOSTIC / D&C  2/12   Bx neg  . DEXA  08/2007   Bone density normal.  . DILATION AND CURETTAGE OF UTERUS  12/11  . IR IMAGING GUIDED PORT INSERTION  03/16/2018  . IR REMOVAL TUN ACCESS W/ PORT W/O FL MOD SED  09/20/2018  . LYMPH NODE BIOPSY N/A 02/19/2018   Procedure: Sentinel LYMPH NODE BIOPSY;  Surgeon: Everitt Amber, MD;  Location: [facility];  Service: Gynecology;  Laterality: N/A;  . REPLACEMENT TOTAL KNEE Left 5/12   X 2 on each  . ROBOTIC ASSISTED TOTAL HYSTERECTOMY WITH BILATERAL SALPINGO OOPHERECTOMY N/A 02/19/2018   For  endometrial cancer.  Procedure: XI ROBOTIC ASSISTED TOTAL HYSTERECTOMY WITH BILATERAL SALPINGO OOPHORECTOMY;  Surgeon: Everitt Amber, MD;  Location: [facility];  Service: Gynecology;  Laterality: N/A;  . TRANSTHORACIC ECHOCARDIOGRAM  10/11/2016    EF 55-60%, grd I DD.  . TUBAL LIGATION  1985   C-Section    There were no vitals filed for this visit.   Subjective Assessment - 01/14/20 1414    Subjective Pt. reporting no change in her pain levels since last session.    Patient is accompained by: Family member   Daughter   Pertinent History anemia, DDD, DM, L foot fx and pinning, GERD, HLD, HTN, B TKA, OA R foot and wrist, asthma, systolic murmur, ACL repair, L sciatica, hypothyroidism, endometrial CA in remission 08/2018, chemo-induced neuropathy    Diagnostic tests none recent    Patient Stated Goals improve pain    Currently in Pain? Yes    Pain Score 9     Pain Orientation Right;Left;Lower    Pain Descriptors / Indicators Aching    Pain Type Acute pain    Multiple Pain Sites No                             [facility] Adult PT Treatment/Exercise - 01/14/20 0001      Knee/Hip Exercises: Stretches   Passive Hamstring Stretch Right;Left;2 reps;30 seconds    Passive Hamstring Stretch Limitations seated with heel prop on 8" step       Knee/Hip Exercises: Aerobic   Nustep Lvl 3, 6 min (LE/UE)      Knee/Hip Exercises: Standing   Hip Abduction Right;Left;10 reps;Knee straight;Stengthening    Abduction Limitations standing at TM rail       Knee/Hip Exercises: Seated   Long Arc Quad Right;Left;10 reps;Strengthening;2 sets    [employer] Limitations seated in chair with no armrests and 4" riser under feet     Marching Right;Left;AROM;10 reps    Marching Limitations no resistance     Hamstring Curl Right;Left;10 reps;Strengthening   terminated after 10 reps on L due to HS cramp    Hamstring Limitations green TB     Sit to Sand 1 set;5 reps;with UE support                      PT Short Term Goals - 01/07/20 1612      PT SHORT TERM GOAL #1   Title Pt will be independent with HEP     Time 3    Period Weeks    Status On-going  Target Date 01/22/20             PT Long Term Goals - 01/07/20 1612      PT LONG TERM GOAL #1   Title Patient to be independent with advanced HEP.    Time 8    Period Weeks    Status On-going      PT LONG TERM GOAL #2   Title Patient to demsontrate lumbar AROM in all planes with <5/10 pain.    Time 8    Period Weeks    Status On-going      PT LONG TERM GOAL #3   Title Patient to score <20 sec on TUG with LRAD to decrease risk of falls.    Time 8    Period Weeks    Status On-going      PT LONG TERM GOAL #4   Title Patient to demonstrate B LE strength >/=4+/5.    Time 8    Period Weeks    Status On-going      PT LONG TERM GOAL #5   Title Patient to report tolerance for navigating upstairs to access her bathroom without assistance.    Time 8    Period Weeks    Status On-going      PT LONG TERM GOAL #6   Title Patient to score 48/56 on Berg to demonstrate decreased risk of falls.    Time 8    Period Weeks    Status On-going                 Plan - 01/14/20 1415    Clinical Impression Statement [name] reporting no changes in her pain levels since last therapy session.  With initial back pain rating of 9/10 which seemed nearly unchanged during standing and sitting LE strengthening therex.  Pt. aggregable to try all LE strengthening activities in session.  Tolerated addition of standing hip abduction kicker at TM rail for support well however did require frequent rest breaks during standing activities due to fatigue.  Will plan to continue LE strengthening per pt. tolerance in coming visits to reduce reliance on AD and WC.    Comorbidities anemia, DDD, DM, L foot fx and pinning, GERD, HLD, HTN, B TKA, OA R foot and wrist, asthma, systolic murmur, ACL repair, L sciatica,  hypothyroidism, endometrial CA in remission 08/2018, chemo-induced neuropathy    Rehab Potential Good    PT Frequency 2x / week    PT Duration 8 weeks    PT Treatment/Interventions ADLs/Self Care Home Management;Cryotherapy;Electrical Stimulation;Moist Heat;Balance training;Therapeutic exercise;Therapeutic activities;Functional mobility training;Stair training;Gait training;DME Instruction;Neuromuscular re-education;Patient/family education;Manual techniques;Taping;Energy conservation;Dry needling;Passive range of motion    PT Next Visit Plan assess lumbar AROM; progress sitting/standing LE stretching and strengthening to tolerance    Consulted and Agree with Plan of Care Patient           Patient will benefit from skilled therapeutic intervention in order to improve the following deficits and impairments:  Decreased endurance, Decreased activity tolerance, Decreased strength, Increased fascial restricitons, Pain, Decreased balance, Decreased mobility, Difficulty walking, Increased muscle spasms, Improper body mechanics, Decreased range of motion, Impaired flexibility, Postural dysfunction  Visit Diagnosis: Chronic bilateral low back pain with bilateral sciatica  Pain in left hip  Pain in right hip  Difficulty in walking, not elsewhere classified  Unsteadiness on feet     Problem List Patient Active Problem List   Diagnosis Date Noted  . Type 2 diabetes, uncontrolled, with retinopathy ([place]) 05/07/2019  .  Pancytopenia, acquired ([place]) 04/30/2018  . Bilateral edema of lower extremity 04/09/2018  . Peripheral neuropathy due to chemotherapy ([place]) 04/09/2018  . Endometrial cancer ([place]) 02/19/2018  . Morbid obesity ([place]) 02/19/2018  . Chronic pain 11/03/2017  . Scapholunate ligament injury, no instability 06/29/2017  . HTN (hypertension) 09/27/2016  . Dyspnea 09/27/2016  . Shingles outbreak 10/20/2015  . 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  . Hypothyroidism 07/10/2015  . OSA (obstructive  sleep apnea) 07/10/2015  . Endometrial hyperplasia 07/02/2015  . Bronchitis 06/01/2015  . Cough variant asthma 11/02/2014  . Obesity     Bess Harvest, [place] 01/14/20 4:46 PM   [facility] [address]  [place], [place], [address] Phone: [phone]   Fax:  [phone]  Name: MARLETTA BOUSQUET MRN: [id number] Date of Birth: [birth year]

## 2020-01-15 DIAGNOSIS — Z23 Encounter for immunization: Secondary | ICD-10-CM | POA: Diagnosis not present

## 2020-01-16 ENCOUNTER — Ambulatory Visit: Payer: Medicare Other

## 2020-01-16 ENCOUNTER — Other Ambulatory Visit: Payer: Self-pay

## 2020-01-16 DIAGNOSIS — R2681 Unsteadiness on feet: Secondary | ICD-10-CM

## 2020-01-16 DIAGNOSIS — M25552 Pain in left hip: Secondary | ICD-10-CM | POA: Diagnosis not present

## 2020-01-16 DIAGNOSIS — M25551 Pain in right hip: Secondary | ICD-10-CM

## 2020-01-16 DIAGNOSIS — G8929 Other chronic pain: Secondary | ICD-10-CM

## 2020-01-16 DIAGNOSIS — M5442 Lumbago with sciatica, left side: Secondary | ICD-10-CM | POA: Diagnosis not present

## 2020-01-16 DIAGNOSIS — R262 Difficulty in walking, not elsewhere classified: Secondary | ICD-10-CM

## 2020-01-16 DIAGNOSIS — M5441 Lumbago with sciatica, right side: Secondary | ICD-10-CM | POA: Diagnosis not present

## 2020-01-16 MED ORDER — FUROSEMIDE 20 MG PO TABS
20.0000 mg | ORAL_TABLET | Freq: Every day | ORAL | 0 refills | Status: DC
Start: 2020-01-16 — End: 2020-04-07

## 2020-01-16 NOTE — Therapy (Signed)
[facility] [address]  [place], [place], [address] Phone: [phone]   Fax:  [phone]  Physical Therapy Treatment  Patient Details  Name: [name] MRN: [id number] Date of Birth: [birth year] Referring Provider (PT): Shawnie Dapper, MD   Encounter Date: 01/16/2020   PT End of Session - 01/16/20 1419    Visit Number 4    Number of Visits 17    Date for PT Re-Evaluation 02/26/20    Authorization Type Medicare & Generic    PT Start Time 3570    PT Stop Time 1447    PT Time Calculation (min) 44 min    Activity Tolerance Patient tolerated treatment well;Patient limited by pain    Behavior During Therapy [facility] for tasks assessed/performed           Past Medical History:  Diagnosis Date  . Allergic rhinitis    Allergy testing: results pending as of 05/28/16 (Dr. Harold Hedge)  . Anemia   . Arthritis   . Chemotherapy-induced neuropathy ([place]) 2019  . Cough variant asthma   . DDD (degenerative disc disease), lumbar   . Diabetes mellitus with complication (HCC)    Mild nonprolif DR R eye 12/2017-->referred to retinal specialist DM MANAGED BY DR. GHERGHE AS OF 2021  . Endometrial cancer ([place]) 02/2018   REMISSION AS OF 08/2018.  Stage III (T3, Nx, Mx)  Path: endometroid adenocarcinoma involving cervix, uterus, and fallopian tubes (Pt got TAH & BSO 02/2018). No sign of recurrence as of onc f/u 05/2019.  Marland Kitchen GERD (gastroesophageal reflux disease)   . Hemorrhoids   . Herpes zoster 10/2015   L side belt-line  . History of adenomatous polyp of colon 02/25/13   5 mm cecal polyp removed by Dr. Trevor Mace 5 yrs  . History of blood transfusion   . History of cellulitis 08/2010   Left (Since replacemnt of Left knee  . Hyperkalemia 03/2018   started after pt started getting chemo-->had to stop enalapril.  Marland Kitchen Hyperlipidemia    Not on statin b/c lipids "stayed down when sugars came down" per pt.  She says Dr. Chalmers Cater knows she  is not on statin anymore.  . Hypertension   . Hypothyroidism   . Nephrolithiasis 4/07  . Obesity   . OSA on CPAP   . Osteoarthritis    knees, ankle, + ? scapholunate ligament disruption (x-ray 06/2017)--ortho referral.  . Pancytopenia due to antineoplastic chemotherapy ([place])    progressive as of 06/2018. Stable 08/2018.  Marland Kitchen Recurrent UTI    started cipro 250 qd 09/2018  . Sciatica of left side   . Severe persistent asthma    cough-variant--saw Allergist 05/25/16 and was switched from max dose advair to symbicort.  . Systolic murmur    ECHO fine 10/2016-->murmur likely flow murmur assoc with HTN.  Marland Kitchen Zoon's vulvitis    Bx-proven (GYN) -lichen sclerosis.  Clobetasol 0.05% ointment per GYN    Past Surgical History:  Procedure Laterality Date  . ANKLE FRACTURE SURGERY  1984   Pin & repair  . ARTHROSCOPIC REPAIR ACL  2000   Due to ACL tear  . ARTHROSCOPIC REPAIR ACL  5/08  . Blateral knee rerlacements x4 2 times each knee    . BREAST BIOPSY Right 2014   Benign  . CARDIAC CATHETERIZATION  5/07   clear vessel mild mitral   . CARPAL TUNNEL RELEASE Right [id number]   1989 Left  . CESAREAN SECTION  1985  .  CHOLECYSTECTOMY OPEN  1988  . COLONOSCOPY N/A 02/25/2013   Tubular adenoma x 1: Recall 5 yrs. Procedure: COLONOSCOPY;  Surgeon: Juanita Craver, MD;  Location: [facility];  Service: Endoscopy;  Laterality: N/A;  . COMBINED HYSTEROSCOPY DIAGNOSTIC / D&C  2/12   Bx neg  . DEXA  08/2007   Bone density normal.  . DILATION AND CURETTAGE OF UTERUS  12/11  . IR IMAGING GUIDED PORT INSERTION  03/16/2018  . IR REMOVAL TUN ACCESS W/ PORT W/O FL MOD SED  09/20/2018  . LYMPH NODE BIOPSY N/A 02/19/2018   Procedure: Sentinel LYMPH NODE BIOPSY;  Surgeon: Everitt Amber, MD;  Location: [facility];  Service: Gynecology;  Laterality: N/A;  . REPLACEMENT TOTAL KNEE Left 5/12   X 2 on each  . ROBOTIC ASSISTED TOTAL HYSTERECTOMY WITH BILATERAL SALPINGO OOPHERECTOMY N/A 02/19/2018   For  endometrial cancer.  Procedure: XI ROBOTIC ASSISTED TOTAL HYSTERECTOMY WITH BILATERAL SALPINGO OOPHORECTOMY;  Surgeon: Everitt Amber, MD;  Location: [facility];  Service: Gynecology;  Laterality: N/A;  . TRANSTHORACIC ECHOCARDIOGRAM  10/11/2016    EF 55-60%, grd I DD.  . TUBAL LIGATION  1985   C-Section    There were no vitals filed for this visit.   Subjective Assessment - 01/16/20 1406    Subjective Pt. reporting increased LBP which she attributes to the poor weather.    Pertinent History anemia, DDD, DM, L foot fx and pinning, GERD, HLD, HTN, B TKA, OA R foot and wrist, asthma, systolic murmur, ACL repair, L sciatica, hypothyroidism, endometrial CA in remission 08/2018, chemo-induced neuropathy    Diagnostic tests none recent    Patient Stated Goals improve pain    Currently in Pain? Yes    Pain Score 8     Pain Location Back    Pain Orientation Right;Left;Lower    Pain Descriptors / Indicators Aching    Pain Type Acute pain    Multiple Pain Sites No                             [facility] Adult PT Treatment/Exercise - 01/16/20 0001      Self-Care   Self-Care Other Self-Care Comments    Other Self-Care Comments  Tennis ball self-massage to B buttocks on wall - pt. feels this is helping her at home       Neck Exercises: Seated   Other Seated Exercise Seated scapular retraction 5" x 10 reps     Other Seated Exercise B shoulder horizontal abduction with red TB x 10       Knee/Hip Exercises: Stretches   Piriformis Stretch Right;Left;1 rep;30 seconds    Piriformis Stretch Limitations modified seated on edge of mat table with LE on mat      Knee/Hip Exercises: Aerobic   Nustep Lvl 4, 6 min (LE/UE)      Knee/Hip Exercises: Seated   Sit to Sand 1 set;5 reps;with UE support      Manual Therapy   Manual Therapy Soft tissue mobilization;Myofascial release    Manual therapy comments standing leaning over elevated mat table + bolster with UE support      Soft tissue mobilization STM to B glutes/piriformis in area of tenderness    Myofascial Release TPr to B piriformis - relief noted following                     PT Short Term Goals - 01/07/20 1612  PT SHORT TERM GOAL #1   Title Pt will be independent with HEP     Time 3    Period Weeks    Status On-going    Target Date 01/22/20             PT Long Term Goals - 01/07/20 1612      PT LONG TERM GOAL #1   Title Patient to be independent with advanced HEP.    Time 8    Period Weeks    Status On-going      PT LONG TERM GOAL #2   Title Patient to demsontrate lumbar AROM in all planes with <5/10 pain.    Time 8    Period Weeks    Status On-going      PT LONG TERM GOAL #3   Title Patient to score <20 sec on TUG with LRAD to decrease risk of falls.    Time 8    Period Weeks    Status On-going      PT LONG TERM GOAL #4   Title Patient to demonstrate B LE strength >/=4+/5.    Time 8    Period Weeks    Status On-going      PT LONG TERM GOAL #5   Title Patient to report tolerance for navigating upstairs to access her bathroom without assistance.    Time 8    Period Weeks    Status On-going      PT LONG TERM GOAL #6   Title Patient to score 48/56 on Berg to demonstrate decreased risk of falls.    Time 8    Period Weeks    Status On-going                 Plan - 01/16/20 1700    Clinical Impression Statement Pt. reporting back pain and hip pain continued to bother her 8-9/10 initial pain rating.  Did have some relief in B buttocks tenderness with self-tennis ball massage and MT with therapist today.  Progressed seated and standing postural strengthening activities today without issue.  Ended visit with pt. noting somewhat reduce pain levels and with plans to continue performing tennis ball self-massage on wall at home for continued relief.    Comorbidities anemia, DDD, DM, L foot fx and pinning, GERD, HLD, HTN, B TKA, OA R foot and wrist, asthma,  systolic murmur, ACL repair, L sciatica, hypothyroidism, endometrial CA in remission 08/2018, chemo-induced neuropathy    Rehab Potential Good    PT Frequency 2x / week    PT Duration 8 weeks    PT Treatment/Interventions ADLs/Self Care Home Management;Cryotherapy;Electrical Stimulation;Moist Heat;Balance training;Therapeutic exercise;Therapeutic activities;Functional mobility training;Stair training;Gait training;DME Instruction;Neuromuscular re-education;Patient/family education;Manual techniques;Taping;Energy conservation;Dry needling;Passive range of motion    PT Next Visit Plan Progress sitting/standing LE stretching and strengthening to tolerance    Consulted and Agree with Plan of Care Patient           Patient will benefit from skilled therapeutic intervention in order to improve the following deficits and impairments:  Decreased endurance, Decreased activity tolerance, Decreased strength, Increased fascial restricitons, Pain, Decreased balance, Decreased mobility, Difficulty walking, Increased muscle spasms, Improper body mechanics, Decreased range of motion, Impaired flexibility, Postural dysfunction  Visit Diagnosis: Chronic bilateral low back pain with bilateral sciatica  Pain in left hip  Pain in right hip  Difficulty in walking, not elsewhere classified  Unsteadiness on feet     Problem List Patient Active Problem List   Diagnosis Date Noted  .  Type 2 diabetes, uncontrolled, with retinopathy ([place]) 05/07/2019  . Pancytopenia, acquired ([place]) 04/30/2018  . Bilateral edema of lower extremity 04/09/2018  . Peripheral neuropathy due to chemotherapy ([place]) 04/09/2018  . Endometrial cancer ([place]) 02/19/2018  . Morbid obesity ([place]) 02/19/2018  . Chronic pain 11/03/2017  . Scapholunate ligament injury, no instability 06/29/2017  . HTN (hypertension) 09/27/2016  . Dyspnea 09/27/2016  . Shingles outbreak 10/20/2015  . 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  . Hypothyroidism 07/10/2015   . OSA (obstructive sleep apnea) 07/10/2015  . Endometrial hyperplasia 07/02/2015  . Bronchitis 06/01/2015  . Cough variant asthma 11/02/2014  . Obesity     Bess Harvest, [place] 01/16/20 5:14 PM   [facility] [address]  [place], [place], [address] Phone: [phone]   Fax:  [phone]  Name: [name] MRN: [id number] Date of Birth: 07-10-1950

## 2020-01-21 ENCOUNTER — Ambulatory Visit: Payer: Medicare Other

## 2020-01-21 ENCOUNTER — Other Ambulatory Visit: Payer: Self-pay

## 2020-01-21 DIAGNOSIS — M5441 Lumbago with sciatica, right side: Secondary | ICD-10-CM | POA: Diagnosis not present

## 2020-01-21 DIAGNOSIS — M25551 Pain in right hip: Secondary | ICD-10-CM

## 2020-01-21 DIAGNOSIS — M5442 Lumbago with sciatica, left side: Secondary | ICD-10-CM | POA: Diagnosis not present

## 2020-01-21 DIAGNOSIS — R262 Difficulty in walking, not elsewhere classified: Secondary | ICD-10-CM

## 2020-01-21 DIAGNOSIS — G8929 Other chronic pain: Secondary | ICD-10-CM

## 2020-01-21 DIAGNOSIS — M25552 Pain in left hip: Secondary | ICD-10-CM | POA: Diagnosis not present

## 2020-01-21 DIAGNOSIS — R2681 Unsteadiness on feet: Secondary | ICD-10-CM

## 2020-01-21 NOTE — Therapy (Signed)
[facility] [address]  [place], [place], [address] Phone: [phone]   Fax:  [phone]  Physical Therapy Treatment All interventions performed day of treatment session.   Patient Details  Name: [name] MRN: [id number] Date of Birth: 1950-11-29 Referring Provider (PT): Shawnie Dapper, MD   Encounter Date: 01/21/2020   PT End of Session - 01/21/20 1410    Visit Number 5    Number of Visits 17    Date for PT Re-Evaluation 02/26/20    Authorization Type Medicare & Generic    PT Start Time 1401    PT Stop Time 1444    PT Time Calculation (min) 43 min    Activity Tolerance Patient tolerated treatment well;Patient limited by pain    Behavior During Therapy [facility] for tasks assessed/performed           Past Medical History:  Diagnosis Date  . Allergic rhinitis    Allergy testing: results pending as of 05/28/16 (Dr. Harold Hedge)  . Anemia   . Arthritis   . Chemotherapy-induced neuropathy ([place]) 2019  . Cough variant asthma   . DDD (degenerative disc disease), lumbar   . Diabetes mellitus with complication (HCC)    Mild nonprolif DR R eye 12/2017-->referred to retinal specialist DM MANAGED BY DR. GHERGHE AS OF 2021  . Endometrial cancer ([place]) 02/2018   REMISSION AS OF 08/2018.  Stage III (T3, Nx, Mx)  Path: endometroid adenocarcinoma involving cervix, uterus, and fallopian tubes (Pt got TAH & BSO 02/2018). No sign of recurrence as of onc f/u 05/2019.  Marland Kitchen GERD (gastroesophageal reflux disease)   . Hemorrhoids   . Herpes zoster 10/2015   L side belt-line  . History of adenomatous polyp of colon 02/25/13   5 mm cecal polyp removed by Dr. Trevor Mace 5 yrs  . History of blood transfusion   . History of cellulitis 08/2010   Left (Since replacemnt of Left knee  . Hyperkalemia 03/2018   started after pt started getting chemo-->had to stop enalapril.  Marland Kitchen Hyperlipidemia    Not on statin b/c lipids "stayed down  when sugars came down" per pt.  She says Dr. Chalmers Cater knows she is not on statin anymore.  . Hypertension   . Hypothyroidism   . Nephrolithiasis 4/07  . Obesity   . OSA on CPAP   . Osteoarthritis    knees, ankle, + ? scapholunate ligament disruption (x-ray 06/2017)--ortho referral.  . Pancytopenia due to antineoplastic chemotherapy ([place])    progressive as of 06/2018. Stable 08/2018.  Marland Kitchen Recurrent UTI    started cipro 250 qd 09/2018  . Sciatica of left side   . Severe persistent asthma    cough-variant--saw Allergist 05/25/16 and was switched from max dose advair to symbicort.  . Systolic murmur    ECHO fine 10/2016-->murmur likely flow murmur assoc with HTN.  Marland Kitchen Zoon's vulvitis    Bx-proven (GYN) -lichen sclerosis.  Clobetasol 0.05% ointment per GYN    Past Surgical History:  Procedure Laterality Date  . ANKLE FRACTURE SURGERY  1984   Pin & repair  . ARTHROSCOPIC REPAIR ACL  2000   Due to ACL tear  . ARTHROSCOPIC REPAIR ACL  5/08  . Blateral knee rerlacements x4 2 times each knee    . BREAST BIOPSY Right 2014   Benign  . CARDIAC CATHETERIZATION  5/07   clear vessel mild mitral   . CARPAL TUNNEL RELEASE Right [id number]   1989 Left  .  CESAREAN SECTION  1985  . CHOLECYSTECTOMY OPEN  1988  . COLONOSCOPY N/A 02/25/2013   Tubular adenoma x 1: Recall 5 yrs. Procedure: COLONOSCOPY;  Surgeon: Juanita Craver, MD;  Location: [facility];  Service: Endoscopy;  Laterality: N/A;  . COMBINED HYSTEROSCOPY DIAGNOSTIC / D&C  2/12   Bx neg  . DEXA  08/2007   Bone density normal.  . DILATION AND CURETTAGE OF UTERUS  12/11  . IR IMAGING GUIDED PORT INSERTION  03/16/2018  . IR REMOVAL TUN ACCESS W/ PORT W/O FL MOD SED  09/20/2018  . LYMPH NODE BIOPSY N/A 02/19/2018   Procedure: Sentinel LYMPH NODE BIOPSY;  Surgeon: Everitt Amber, MD;  Location: [facility];  Service: Gynecology;  Laterality: N/A;  . REPLACEMENT TOTAL KNEE Left 5/12   X 2 on each  . ROBOTIC ASSISTED TOTAL HYSTERECTOMY WITH  BILATERAL SALPINGO OOPHERECTOMY N/A 02/19/2018   For endometrial cancer.  Procedure: XI ROBOTIC ASSISTED TOTAL HYSTERECTOMY WITH BILATERAL SALPINGO OOPHORECTOMY;  Surgeon: Everitt Amber, MD;  Location: [facility];  Service: Gynecology;  Laterality: N/A;  . TRANSTHORACIC ECHOCARDIOGRAM  10/11/2016    EF 55-60%, grd I DD.  . TUBAL LIGATION  1985   C-Section    There were no vitals filed for this visit.   Subjective Assessment - 01/21/20 1405    Subjective Pt. admits to partial/full HEP performance daily.    Patient is accompained by: Family member   daughter   Pertinent History anemia, DDD, DM, L foot fx and pinning, GERD, HLD, HTN, B TKA, OA R foot and wrist, asthma, systolic murmur, ACL repair, L sciatica, hypothyroidism, endometrial CA in remission 08/2018, chemo-induced neuropathy    Diagnostic tests none recent    Patient Stated Goals improve pain    Currently in Pain? Yes    Pain Score 7     Pain Location Back    Pain Orientation Right;Left;Lower    Pain Descriptors / Indicators Aching    Pain Type Acute pain    Multiple Pain Sites No              [facility] PT Assessment - 01/21/20 0001      AROM   AROM Assessment Site Lumbar    Lumbar Flexion flexion to mid shins     Lumbar Extension 70% limited     Lumbar - Right Side Bend 1" short of knee joint     Lumbar - Left Side Bend 1" short of knee joint     Lumbar - Right Rotation 10%    Lumbar - Left Rotation 50%                 Therex: seated red TB march x 10 Seated red TB clam shell x 15        [facility] Adult PT Treatment/Exercise - 01/21/20 0001      Knee/Hip Exercises: Stretches   Piriformis Stretch Right;Left;1 rep;30 seconds   good tolerance    Piriformis Stretch Limitations modified seated on edge of mat table with LE on mat      Knee/Hip Exercises: Aerobic   Nustep Lvl 4, 6 min (LE/UE)      Knee/Hip Exercises: Standing   Knee Flexion Right;Left;5 reps;Strengthening    Knee Flexion  Limitations increased buttocks pain thus terminated after 5    Hip Abduction Right;Left;10 reps;Knee straight;Stengthening    Abduction Limitations standing at TM rail       Knee/Hip Exercises: Seated   Sit to Sand 1 set;10  reps;with UE support   from chair      Manual Therapy   Manual Therapy Soft tissue mobilization    Manual therapy comments seated     Soft tissue mobilization STM/strumming to B thoracic and lumbar paraspinasl                     PT Short Term Goals - 01/21/20 1413      PT SHORT TERM GOAL #1   Title Pt will be independent with HEP     Time 3    Period Weeks    Status Achieved    Target Date 01/22/20             PT Long Term Goals - 01/07/20 1612      PT LONG TERM GOAL #1   Title Patient to be independent with advanced HEP.    Time 8    Period Weeks    Status On-going      PT LONG TERM GOAL #2   Title Patient to demsontrate lumbar AROM in all planes with <5/10 pain.    Time 8    Period Weeks    Status On-going      PT LONG TERM GOAL #3   Title Patient to score <20 sec on TUG with LRAD to decrease risk of falls.    Time 8    Period Weeks    Status On-going      PT LONG TERM GOAL #4   Title Patient to demonstrate B LE strength >/=4+/5.    Time 8    Period Weeks    Status On-going      PT LONG TERM GOAL #5   Title Patient to report tolerance for navigating upstairs to access her bathroom without assistance.    Time 8    Period Weeks    Status On-going      PT LONG TERM GOAL #6   Title Patient to score 48/56 on Berg to demonstrate decreased risk of falls.    Time 8    Period Weeks    Status On-going                 Plan - 01/21/20 1413    Clinical Impression Statement Pt. reports she understands HEP and performs HEP exercise throughout day.  STG #1 met.  Notes somewhat improved LBP today however still with high reported subjective pain levels to start session with LBP increasing with prolonged standing/walking.  Able  to progress repetitions with standing proximal hip exercises and sit<>stand from chair without hand push off.  Pt. still easily fatigues with therex requiring sitting rest breaks.  MT addressed B thoracic/lumbar paraspinal tenderness R>L with some relief noted.  Pt. reports continues B buttocks tenderness L>R which has been reduced somewhat after home performance of self-massage with tennis ball on wall.  Will continue to progress standing strengthening, balance, and lumbar ROM activities as pt. able in coming sessions.      Comorbidities anemia, DDD, DM, L foot fx and pinning, GERD, HLD, HTN, B TKA, OA R foot and wrist, asthma, systolic murmur, ACL repair, L sciatica, hypothyroidism, endometrial CA in remission 08/2018, chemo-induced neuropathy    Rehab Potential Good    PT Frequency 2x / week    PT Duration 8 weeks    PT Treatment/Interventions ADLs/Self Care Home Management;Cryotherapy;Electrical Stimulation;Moist Heat;Balance training;Therapeutic exercise;Therapeutic activities;Functional mobility training;Stair training;Gait training;DME Instruction;Neuromuscular re-education;Patient/family education;Manual techniques;Taping;Energy conservation;Dry needling;Passive range of motion    PT Next  Visit Plan Progress sitting/standing LE stretching and strengthening to tolerance    Consulted and Agree with Plan of Care Patient           Patient will benefit from skilled therapeutic intervention in order to improve the following deficits and impairments:  Decreased endurance, Decreased activity tolerance, Decreased strength, Increased fascial restricitons, Pain, Decreased balance, Decreased mobility, Difficulty walking, Increased muscle spasms, Improper body mechanics, Decreased range of motion, Impaired flexibility, Postural dysfunction  Visit Diagnosis: Chronic bilateral low back pain with bilateral sciatica  Pain in left hip  Pain in right hip  Difficulty in walking, not elsewhere  classified  Unsteadiness on feet     Problem List Patient Active Problem List   Diagnosis Date Noted  . Type 2 diabetes, uncontrolled, with retinopathy ([place]) 05/07/2019  . Pancytopenia, acquired ([place]) 04/30/2018  . Bilateral edema of lower extremity 04/09/2018  . Peripheral neuropathy due to chemotherapy ([place]) 04/09/2018  . Endometrial cancer ([place]) 02/19/2018  . Morbid obesity ([place]) 02/19/2018  . Chronic pain 11/03/2017  . Scapholunate ligament injury, no instability 06/29/2017  . HTN (hypertension) 09/27/2016  . Dyspnea 09/27/2016  . Shingles outbreak 10/20/2015  . 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  . Hypothyroidism 07/10/2015  . OSA (obstructive sleep apnea) 07/10/2015  . Endometrial hyperplasia 07/02/2015  . Bronchitis 06/01/2015  . Cough variant asthma 11/02/2014  . Obesity    Bess Harvest, [place] 01/21/20 6:14 PM   [facility] [address]  [place], [place], [address] Phone: [phone]   Fax:  [phone]  Name: [name] MRN: [id number] Date of Birth: September 20, 1950

## 2020-01-23 ENCOUNTER — Encounter: Payer: Medicare Other | Admitting: Physical Therapy

## 2020-01-27 ENCOUNTER — Other Ambulatory Visit: Payer: Self-pay | Admitting: Family Medicine

## 2020-01-28 ENCOUNTER — Ambulatory Visit: Payer: Medicare Other

## 2020-01-28 ENCOUNTER — Other Ambulatory Visit: Payer: Self-pay

## 2020-01-28 DIAGNOSIS — M25552 Pain in left hip: Secondary | ICD-10-CM

## 2020-01-28 DIAGNOSIS — R2681 Unsteadiness on feet: Secondary | ICD-10-CM

## 2020-01-28 DIAGNOSIS — R262 Difficulty in walking, not elsewhere classified: Secondary | ICD-10-CM

## 2020-01-28 DIAGNOSIS — G8929 Other chronic pain: Secondary | ICD-10-CM | POA: Diagnosis not present

## 2020-01-28 DIAGNOSIS — M25551 Pain in right hip: Secondary | ICD-10-CM

## 2020-01-28 DIAGNOSIS — M5441 Lumbago with sciatica, right side: Secondary | ICD-10-CM

## 2020-01-28 DIAGNOSIS — M5442 Lumbago with sciatica, left side: Secondary | ICD-10-CM | POA: Diagnosis not present

## 2020-01-28 NOTE — Therapy (Signed)
[facility] [address]  [place], [place], [address] Phone: [phone]   Fax:  [phone]  Physical Therapy Treatment  Patient Details  Name: [name] MRN: [id number] Date of Birth: 05-19-1950 Referring Provider (PT): Shawnie Dapper, MD   Encounter Date: 01/28/2020   PT End of Session - 01/28/20 1414    Visit Number 6    Number of Visits 17    Date for PT Re-Evaluation 02/26/20    Authorization Type Medicare & Generic    PT Start Time 1400    PT Stop Time 1443    PT Time Calculation (min) 43 min    Activity Tolerance Patient tolerated treatment well;Patient limited by pain    Behavior During Therapy [facility] for tasks assessed/performed           Past Medical History:  Diagnosis Date  . Allergic rhinitis    Allergy testing: results pending as of 05/28/16 (Dr. Harold Hedge)  . Anemia   . Arthritis   . Chemotherapy-induced neuropathy ([place]) 2019  . Cough variant asthma   . DDD (degenerative disc disease), lumbar   . Diabetes mellitus with complication (HCC)    Mild nonprolif DR R eye 12/2017-->referred to retinal specialist DM MANAGED BY DR. GHERGHE AS OF 2021  . Endometrial cancer ([place]) 02/2018   REMISSION AS OF 08/2018.  Stage III (T3, Nx, Mx)  Path: endometroid adenocarcinoma involving cervix, uterus, and fallopian tubes (Pt got TAH & BSO 02/2018). No sign of recurrence as of onc f/u 05/2019.  Marland Kitchen GERD (gastroesophageal reflux disease)   . Hemorrhoids   . Herpes zoster 10/2015   L side belt-line  . History of adenomatous polyp of colon 02/25/13   5 mm cecal polyp removed by Dr. Trevor Mace 5 yrs  . History of blood transfusion   . History of cellulitis 08/2010   Left (Since replacemnt of Left knee  . Hyperkalemia 03/2018   started after pt started getting chemo-->had to stop enalapril.  Marland Kitchen Hyperlipidemia    Not on statin b/c lipids "stayed down when sugars came down" per pt.  She says Dr. Chalmers Cater knows she  is not on statin anymore.  . Hypertension   . Hypothyroidism   . Nephrolithiasis 4/07  . Obesity   . OSA on CPAP   . Osteoarthritis    knees, ankle, + ? scapholunate ligament disruption (x-ray 06/2017)--ortho referral.  . Pancytopenia due to antineoplastic chemotherapy ([place])    progressive as of 06/2018. Stable 08/2018.  Marland Kitchen Recurrent UTI    started cipro 250 qd 09/2018  . Sciatica of left side   . Severe persistent asthma    cough-variant--saw Allergist 05/25/16 and was switched from max dose advair to symbicort.  . Systolic murmur    ECHO fine 10/2016-->murmur likely flow murmur assoc with HTN.  Marland Kitchen Zoon's vulvitis    Bx-proven (GYN) -lichen sclerosis.  Clobetasol 0.05% ointment per GYN    Past Surgical History:  Procedure Laterality Date  . ANKLE FRACTURE SURGERY  1984   Pin & repair  . ARTHROSCOPIC REPAIR ACL  2000   Due to ACL tear  . ARTHROSCOPIC REPAIR ACL  5/08  . Blateral knee rerlacements x4 2 times each knee    . BREAST BIOPSY Right 2014   Benign  . CARDIAC CATHETERIZATION  5/07   clear vessel mild mitral   . CARPAL TUNNEL RELEASE Right [id number]   1989 Left  . CESAREAN SECTION  1985  .  CHOLECYSTECTOMY OPEN  1988  . COLONOSCOPY N/A 02/25/2013   Tubular adenoma x 1: Recall 5 yrs. Procedure: COLONOSCOPY;  Surgeon: Juanita Craver, MD;  Location: [facility];  Service: Endoscopy;  Laterality: N/A;  . COMBINED HYSTEROSCOPY DIAGNOSTIC / D&C  2/12   Bx neg  . DEXA  08/2007   Bone density normal.  . DILATION AND CURETTAGE OF UTERUS  12/11  . IR IMAGING GUIDED PORT INSERTION  03/16/2018  . IR REMOVAL TUN ACCESS W/ PORT W/O FL MOD SED  09/20/2018  . LYMPH NODE BIOPSY N/A 02/19/2018   Procedure: Sentinel LYMPH NODE BIOPSY;  Surgeon: Everitt Amber, MD;  Location[facility];  Service: Gynecology;  Laterality: N/A;  . REPLACEMENT TOTAL KNEE Left 5/12   X 2 on each  . ROBOTIC ASSISTED TOTAL HYSTERECTOMY WITH BILATERAL SALPINGO OOPHERECTOMY N/A 02/19/2018   For  endometrial cancer.  Procedure: XI ROBOTIC ASSISTED TOTAL HYSTERECTOMY WITH BILATERAL SALPINGO OOPHORECTOMY;  Surgeon: Everitt Amber, MD;  Location: [facility];  Service: Gynecology;  Laterality: N/A;  . TRANSTHORACIC ECHOCARDIOGRAM  10/11/2016    EF 55-60%, grd I DD.  . TUBAL LIGATION  1985   C-Section    There were no vitals filed for this visit.   Subjective Assessment - 01/28/20 1407    Subjective Doing ok.    Patient is accompained by: Family member   Daughter   Pertinent History anemia, DDD, DM, L foot fx and pinning, GERD, HLD, HTN, B TKA, OA R foot and wrist, asthma, systolic murmur, ACL repair, L sciatica, hypothyroidism, endometrial CA in remission 08/2018, chemo-induced neuropathy    Diagnostic tests none recent    Patient Stated Goals improve pain    Currently in Pain? Yes    Pain Score 9     Pain Location Back    Pain Orientation Right;Left;Lower    Pain Descriptors / Indicators Aching;Numbness    Pain Type Acute pain    Pain Radiating Towards Numbness into L foot    Pain Onset More than a month ago    Pain Frequency Constant    Multiple Pain Sites No              [facility] PT Assessment - 01/28/20 0001      Assessment   Medical Diagnosis Chronic bilateral LBP with B sciatica, lumbat spondylosis, lumbar DDD    Referring Provider (PT) Shawnie Dapper, MD    Hand Dominance Right    Next MD Visit 03/31/20    Prior Therapy yes- for LBP      Timed Up and Go Test   Normal TUG (seconds) 19.41   with SPC                        [facility] Adult PT Treatment/Exercise - 01/28/20 0001      Self-Care   Self-Care Other Self-Care Comments    Other Self-Care Comments  discussed strategies that pt. could use to practice stairs navigation on first step with use of B hand support at home       Knee/Hip Exercises: Stretches   Passive Hamstring Stretch Right;Left;2 reps;30 seconds    Passive Hamstring Stretch Limitations seated with heel prop on 8" step      Piriformis Stretch Right;Left;1 rep;30 seconds    Piriformis Stretch Limitations modified seated on edge of mat table with LE on mat      Knee/Hip Exercises: Aerobic   Nustep Lvl 4, 6 min (LE/UE)  Knee/Hip Exercises: Standing   Forward Step Up Left;Right;5 reps                  PT Education - 01/28/20 1610    Education Details HEP update    Person(s) Educated Patient    Methods Explanation;Demonstration;Verbal cues;Handout    Comprehension Verbalized understanding;Returned demonstration;Verbal cues required            PT Short Term Goals - 01/21/20 1413      PT SHORT TERM GOAL #1   Title Pt will be independent with HEP     Time 3    Period Weeks    Status Achieved    Target Date 01/22/20             PT Long Term Goals - 01/28/20 1428      PT LONG TERM GOAL #1   Title Patient to be independent with advanced HEP.    Time 8    Period Weeks    Status On-going      PT LONG TERM GOAL #2   Title Patient to demsontrate lumbar AROM in all planes with <5/10 pain.    Time 8    Period Weeks    Status On-going      PT LONG TERM GOAL #3   Title Patient to score <20 sec on TUG with LRAD to decrease risk of falls.    Time 8    Period Weeks    Status Achieved   01/28/20:  met with SPC     PT LONG TERM GOAL #4   Title Patient to demonstrate B LE strength >/=4+/5.    Time 8    Period Weeks    Status On-going      PT LONG TERM GOAL #5   Title Patient to report tolerance for navigating upstairs to access her bathroom without assistance.    Time 8    Period Weeks    Status On-going      PT LONG TERM GOAL #6   Title Patient to score 48/56 on Berg to demonstrate decreased risk of falls.    Time 8    Period Weeks    Status On-going                 Plan - 01/28/20 1422    Clinical Impression Statement [name] reporting her LBP levels are nearly unchanged today.  Tolerated progression of standing exercises to include forward step-ups x 5 each LE.   Pt. requiring sitting rest breaks between sets of step-ups due to LBP however did note that she will be able to perform this exercise at home for improved stair navigation.  HEP updated and handout issued to pt.  Pt. has met LTG #3 as she demonstrates TUG time with SPC <20 sec demonstrating reduced fall risk.    Comorbidities anemia, DDD, DM, L foot fx and pinning, GERD, HLD, HTN, B TKA, OA R foot and wrist, asthma, systolic murmur, ACL repair, L sciatica, hypothyroidism, endometrial CA in remission 08/2018, chemo-induced neuropathy    Rehab Potential Good    PT Frequency 2x / week    PT Duration 8 weeks    PT Treatment/Interventions ADLs/Self Care Home Management;Cryotherapy;Electrical Stimulation;Moist Heat;Balance training;Therapeutic exercise;Therapeutic activities;Functional mobility training;Stair training;Gait training;DME Instruction;Neuromuscular re-education;Patient/family education;Manual techniques;Taping;Energy conservation;Dry needling;Passive range of motion    PT Next Visit Plan Progress sitting/standing LE stretching and strengthening to tolerance    Consulted and Agree with Plan of Care Patient  Patient will benefit from skilled therapeutic intervention in order to improve the following deficits and impairments:  Decreased endurance, Decreased activity tolerance, Decreased strength, Increased fascial restricitons, Pain, Decreased balance, Decreased mobility, Difficulty walking, Increased muscle spasms, Improper body mechanics, Decreased range of motion, Impaired flexibility, Postural dysfunction  Visit Diagnosis: Chronic bilateral low back pain with bilateral sciatica  Pain in left hip  Pain in right hip  Difficulty in walking, not elsewhere classified  Unsteadiness on feet     Problem List Patient Active Problem List   Diagnosis Date Noted  . Type 2 diabetes, uncontrolled, with retinopathy ([place]) 05/07/2019  . Pancytopenia, acquired ([place]) 04/30/2018  .  Bilateral edema of lower extremity 04/09/2018  . Peripheral neuropathy due to chemotherapy ([place]) 04/09/2018  . Endometrial cancer ([place]) 02/19/2018  . Morbid obesity ([place]) 02/19/2018  . Chronic pain 11/03/2017  . Scapholunate ligament injury, no instability 06/29/2017  . HTN (hypertension) 09/27/2016  . Dyspnea 09/27/2016  . Shingles outbreak 10/20/2015  . 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  . Hypothyroidism 07/10/2015  . OSA (obstructive sleep apnea) 07/10/2015  . Endometrial hyperplasia 07/02/2015  . Bronchitis 06/01/2015  . Cough variant asthma 11/02/2014  . Obesity    Bess Harvest, [place] 01/28/20 4:14 PM     [facility] [address]  [place], [place], [address] Phone: [phone]   Fax:  [phone]  Name: [name] MRN: [id number] Date of Birth: Jul 20, 1950

## 2020-01-30 ENCOUNTER — Other Ambulatory Visit: Payer: Self-pay

## 2020-01-30 ENCOUNTER — Ambulatory Visit: Payer: Medicare Other

## 2020-01-30 DIAGNOSIS — G8929 Other chronic pain: Secondary | ICD-10-CM

## 2020-01-30 DIAGNOSIS — M5441 Lumbago with sciatica, right side: Secondary | ICD-10-CM | POA: Diagnosis not present

## 2020-01-30 DIAGNOSIS — R2681 Unsteadiness on feet: Secondary | ICD-10-CM

## 2020-01-30 DIAGNOSIS — R262 Difficulty in walking, not elsewhere classified: Secondary | ICD-10-CM

## 2020-01-30 DIAGNOSIS — M5442 Lumbago with sciatica, left side: Secondary | ICD-10-CM | POA: Diagnosis not present

## 2020-01-30 DIAGNOSIS — M25552 Pain in left hip: Secondary | ICD-10-CM

## 2020-01-30 DIAGNOSIS — M25551 Pain in right hip: Secondary | ICD-10-CM

## 2020-01-30 NOTE — Therapy (Signed)
[facility] [address]  [place], [place], [address] Phone: [phone]   Fax:  [phone]  Physical Therapy Treatment  Patient Details  Name: [name] MRN: [id number] Date of Birth: 05-13-1950 Referring Provider (PT): Shawnie Dapper, MD   Encounter Date: 01/30/2020   PT End of Session - 01/30/20 1409    Visit Number 7    Number of Visits 17    Date for PT Re-Evaluation 02/26/20    Authorization Type Medicare & Generic    PT Start Time 5573    PT Stop Time 1444    PT Time Calculation (min) 41 min    Activity Tolerance Patient tolerated treatment well;Patient limited by pain    Behavior During Therapy [facility] for tasks assessed/performed           Past Medical History:  Diagnosis Date  . Allergic rhinitis    Allergy testing: results pending as of 05/28/16 (Dr. Harold Hedge)  . Anemia   . Arthritis   . Chemotherapy-induced neuropathy ([place]) 2019  . Cough variant asthma   . DDD (degenerative disc disease), lumbar   . Diabetes mellitus with complication (HCC)    Mild nonprolif DR R eye 12/2017-->referred to retinal specialist DM MANAGED BY DR. GHERGHE AS OF 2021  . Endometrial cancer ([place]) 02/2018   REMISSION AS OF 08/2018.  Stage III (T3, Nx, Mx)  Path: endometroid adenocarcinoma involving cervix, uterus, and fallopian tubes (Pt got TAH & BSO 02/2018). No sign of recurrence as of onc f/u 05/2019.  Marland Kitchen GERD (gastroesophageal reflux disease)   . Hemorrhoids   . Herpes zoster 10/2015   L side belt-line  . History of adenomatous polyp of colon 02/25/13   5 mm cecal polyp removed by Dr. Trevor Mace 5 yrs  . History of blood transfusion   . History of cellulitis 08/2010   Left (Since replacemnt of Left knee  . Hyperkalemia 03/2018   started after pt started getting chemo-->had to stop enalapril.  Marland Kitchen Hyperlipidemia    Not on statin b/c lipids "stayed down when sugars came down" per pt.  She says Dr. Chalmers Cater knows she  is not on statin anymore.  . Hypertension   . Hypothyroidism   . Nephrolithiasis 4/07  . Obesity   . OSA on CPAP   . Osteoarthritis    knees, ankle, + ? scapholunate ligament disruption (x-ray 06/2017)--ortho referral.  . Pancytopenia due to antineoplastic chemotherapy ([place])    progressive as of 06/2018. Stable 08/2018.  Marland Kitchen Recurrent UTI    started cipro 250 qd 09/2018  . Sciatica of left side   . Severe persistent asthma    cough-variant--saw Allergist 05/25/16 and was switched from max dose advair to symbicort.  . Systolic murmur    ECHO fine 10/2016-->murmur likely flow murmur assoc with HTN.  Marland Kitchen Zoon's vulvitis    Bx-proven (GYN) -lichen sclerosis.  Clobetasol 0.05% ointment per GYN    Past Surgical History:  Procedure Laterality Date  . ANKLE FRACTURE SURGERY  1984   Pin & repair  . ARTHROSCOPIC REPAIR ACL  2000   Due to ACL tear  . ARTHROSCOPIC REPAIR ACL  5/08  . Blateral knee rerlacements x4 2 times each knee    . BREAST BIOPSY Right 2014   Benign  . CARDIAC CATHETERIZATION  5/07   clear vessel mild mitral   . CARPAL TUNNEL RELEASE Right [id number]   1989 Left  . CESAREAN SECTION  1985  .  CHOLECYSTECTOMY OPEN  1988  . COLONOSCOPY N/A 02/25/2013   Tubular adenoma x 1: Recall 5 yrs. Procedure: COLONOSCOPY;  Surgeon: Juanita Craver, MD;  [facility];  Service: Endoscopy;  Laterality: N/A;  . COMBINED HYSTEROSCOPY DIAGNOSTIC / D&C  2/12   Bx neg  . DEXA  08/2007   Bone density normal.  . DILATION AND CURETTAGE OF UTERUS  12/11  . IR IMAGING GUIDED PORT INSERTION  03/16/2018  . IR REMOVAL TUN ACCESS W/ PORT W/O FL MOD SED  09/20/2018  . LYMPH NODE BIOPSY N/A 02/19/2018   Procedure: Sentinel LYMPH NODE BIOPSY;  Surgeon: Everitt Amber, MD;  [facility];  Service: Gynecology;  Laterality: N/A;  . REPLACEMENT TOTAL KNEE Left 5/12   X 2 on each  . ROBOTIC ASSISTED TOTAL HYSTERECTOMY WITH BILATERAL SALPINGO OOPHERECTOMY N/A 02/19/2018   For  endometrial cancer.  Procedure: XI ROBOTIC ASSISTED TOTAL HYSTERECTOMY WITH BILATERAL SALPINGO OOPHORECTOMY;  Surgeon: Everitt Amber, MD;  Location: [facility];  Service: Gynecology;  Laterality: N/A;  . TRANSTHORACIC ECHOCARDIOGRAM  10/11/2016    EF 55-60%, grd I DD.  . TUBAL LIGATION  1985   C-Section    There were no vitals filed for this visit.   Subjective Assessment - 01/30/20 1406    Subjective Notes the step-up has increased her LBP however she performed these exercise yesterday.    Pertinent History anemia, DDD, DM, L foot fx and pinning, GERD, HLD, HTN, B TKA, OA R foot and wrist, asthma, systolic murmur, ACL repair, L sciatica, hypothyroidism, endometrial CA in remission 08/2018, chemo-induced neuropathy    Patient Stated Goals improve pain    Currently in Pain? Yes    Pain Score --   9.5/10   Pain Location Back    Pain Orientation Right;Left;Lower    Pain Descriptors / Indicators Aching;Numbness    Pain Type Acute pain    Pain Radiating Towards numbness into L foot    Pain Onset More than a month ago    Pain Frequency Constant    Multiple Pain Sites No              [facility] PT Assessment - 01/30/20 0001      Standardized Balance Assessment   Standardized Balance Assessment Berg Balance Test      Berg Balance Test   Sit to Stand Able to stand without using hands and stabilize independently    Standing Unsupported Able to stand safely 2 minutes    Sitting with Back Unsupported but Feet Supported on Floor or Stool Able to sit safely and securely 2 minutes    Stand to Sit Sits safely with minimal use of hands    Transfers Able to transfer safely, minor use of hands    Standing Unsupported with Eyes Closed Able to stand 10 seconds safely    Standing Unsupported with Feet Together Able to place feet together independently and stand 1 minute safely    From Standing, Reach Forward with Outstretched Arm Can reach forward >5 cm safely (2")    From Standing  Position, Pick up Object from Floor Able to pick up shoe, needs supervision    From Standing Position, Turn to Look Behind Over each Shoulder Looks behind one side only/other side shows less weight shift    Turn 360 Degrees Able to turn 360 degrees safely but slowly    Standing Unsupported, Alternately Place Feet on Step/Stool Able to complete 4 steps without aid or supervision  Standing Unsupported, One Foot in [place] to plae foot ahead of the other independently and hold 30 seconds    Standing on One Leg Tries to lift leg/unable to hold 3 seconds but remains standing independently    Total Score 44                         [facility] Adult PT Treatment/Exercise - 01/30/20 0001      Knee/Hip Exercises: Aerobic   Nustep Lvl 4, 6 min (LE/UE)                  PT Education - 01/30/20 1504    Education Details HEP update    Person(s) Educated Patient    Methods Explanation;Demonstration;Verbal cues;Handout    Comprehension Verbalized understanding;Returned demonstration;Verbal cues required            PT Short Term Goals - 01/21/20 1413      PT SHORT TERM GOAL #1   Title Pt will be independent with HEP     Time 3    Period Weeks    Status Achieved    Target Date 01/22/20             PT Long Term Goals - 01/28/20 1428      PT LONG TERM GOAL #1   Title Patient to be independent with advanced HEP.    Time 8    Period Weeks    Status On-going      PT LONG TERM GOAL #2   Title Patient to demsontrate lumbar AROM in all planes with <5/10 pain.    Time 8    Period Weeks    Status On-going      PT LONG TERM GOAL #3   Title Patient to score <20 sec on TUG with LRAD to decrease risk of falls.    Time 8    Period Weeks    Status Achieved   01/28/20:  met with SPC     PT LONG TERM GOAL #4   Title Patient to demonstrate B LE strength >/=4+/5.    Time 8    Period Weeks    Status On-going      PT LONG TERM GOAL #5   Title Patient to report  tolerance for navigating upstairs to access her bathroom without assistance.    Time 8    Period Weeks    Status On-going      PT LONG TERM GOAL #6   Title Patient to score 48/56 on Berg to demonstrate decreased risk of falls.    Time 8    Period Weeks    Status On-going                 Plan - 01/30/20 1505    Clinical Impression Statement Pt. noting somewhat increased LBP initially today which she attributes to recent performance of forward step-ups performed at home.  BERG Balance score of 44/56 revealing difficulty maintaining balance with SLS and narrow BOS static balance activities.  HEP updated to focus on current static balance deficits.  Pt. leaving session noting LBP at baseline.    Comorbidities anemia, DDD, DM, L foot fx and pinning, GERD, HLD, HTN, B TKA, OA R foot and wrist, asthma, systolic murmur, ACL repair, L sciatica, hypothyroidism, endometrial CA in remission 08/2018, chemo-induced neuropathy    Rehab Potential Good    PT Frequency 2x / week    PT Duration 8 weeks  PT Treatment/Interventions ADLs/Self Care Home Management;Cryotherapy;Electrical Stimulation;Moist Heat;Balance training;Therapeutic exercise;Therapeutic activities;Functional mobility training;Stair training;Gait training;DME Instruction;Neuromuscular re-education;Patient/family education;Manual techniques;Taping;Energy conservation;Dry needling;Passive range of motion    PT Next Visit Plan Progress sitting/standing LE stretching and strengthening to tolerance    Consulted and Agree with Plan of Care Patient           Patient will benefit from skilled therapeutic intervention in order to improve the following deficits and impairments:  Decreased endurance, Decreased activity tolerance, Decreased strength, Increased fascial restricitons, Pain, Decreased balance, Decreased mobility, Difficulty walking, Increased muscle spasms, Improper body mechanics, Decreased range of motion, Impaired flexibility,  Postural dysfunction  Visit Diagnosis: Chronic bilateral low back pain with bilateral sciatica  Pain in left hip  Pain in right hip  Difficulty in walking, not elsewhere classified  Unsteadiness on feet     Problem List Patient Active Problem List   Diagnosis Date Noted  . Type 2 diabetes, uncontrolled, with retinopathy ([place]) 05/07/2019  . Pancytopenia, acquired ([place]) 04/30/2018  . Bilateral edema of lower extremity 04/09/2018  . Peripheral neuropathy due to chemotherapy ([place]) 04/09/2018  . Endometrial cancer ([place]) 02/19/2018  . Morbid obesity ([place]) 02/19/2018  . Chronic pain 11/03/2017  . Scapholunate ligament injury, no instability 06/29/2017  . HTN (hypertension) 09/27/2016  . Dyspnea 09/27/2016  . Shingles outbreak 10/20/2015  . 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  . Hypothyroidism 07/10/2015  . OSA (obstructive sleep apnea) 07/10/2015  . Endometrial hyperplasia 07/02/2015  . Bronchitis 06/01/2015  . Cough variant asthma 11/02/2014  . Obesity     Bess Harvest, [place] 01/30/20 6:02 PM   [facility] [address]  [place], [place], [address] Phone: [phone]   Fax:  [phone]  Name: [name] MRN: [id number] Date of Birth: 04/22/1950

## 2020-02-04 ENCOUNTER — Ambulatory Visit: Payer: Medicare Other

## 2020-02-04 ENCOUNTER — Other Ambulatory Visit: Payer: Self-pay

## 2020-02-04 DIAGNOSIS — M25551 Pain in right hip: Secondary | ICD-10-CM

## 2020-02-04 DIAGNOSIS — R2681 Unsteadiness on feet: Secondary | ICD-10-CM

## 2020-02-04 DIAGNOSIS — M25552 Pain in left hip: Secondary | ICD-10-CM | POA: Diagnosis not present

## 2020-02-04 DIAGNOSIS — R262 Difficulty in walking, not elsewhere classified: Secondary | ICD-10-CM

## 2020-02-04 DIAGNOSIS — M5442 Lumbago with sciatica, left side: Secondary | ICD-10-CM | POA: Diagnosis not present

## 2020-02-04 DIAGNOSIS — G8929 Other chronic pain: Secondary | ICD-10-CM

## 2020-02-04 DIAGNOSIS — M5441 Lumbago with sciatica, right side: Secondary | ICD-10-CM | POA: Diagnosis not present

## 2020-02-04 NOTE — Therapy (Signed)
[facility] [address]  [place], [place], [address] Phone: [phone]   Fax:  [phone]  Physical Therapy Treatment  Patient Details  Name: [name] MRN: [id number] Date of Birth: 07/18/1950 Referring Provider (PT): Shawnie Dapper, MD   Encounter Date: 02/04/2020   PT End of Session - 02/04/20 1405    Visit Number 8    Number of Visits 17    Date for PT Re-Evaluation 02/26/20    Authorization Type Medicare & Generic    PT Start Time 1359    PT Stop Time 1442    PT Time Calculation (min) 43 min    Activity Tolerance Patient tolerated treatment well;Patient limited by pain    Behavior During Therapy [facility] for tasks assessed/performed           Past Medical History:  Diagnosis Date  . Allergic rhinitis    Allergy testing: results pending as of 05/28/16 (Dr. Harold Hedge)  . Anemia   . Arthritis   . Chemotherapy-induced neuropathy ([place]) 2019  . Cough variant asthma   . DDD (degenerative disc disease), lumbar   . Diabetes mellitus with complication (HCC)    Mild nonprolif DR R eye 12/2017-->referred to retinal specialist DM MANAGED BY DR. GHERGHE AS OF 2021  . Endometrial cancer ([place]) 02/2018   REMISSION AS OF 08/2018.  Stage III (T3, Nx, Mx)  Path: endometroid adenocarcinoma involving cervix, uterus, and fallopian tubes (Pt got TAH & BSO 02/2018). No sign of recurrence as of onc f/u 05/2019.  Marland Kitchen GERD (gastroesophageal reflux disease)   . Hemorrhoids   . Herpes zoster 10/2015   L side belt-line  . History of adenomatous polyp of colon 02/25/13   5 mm cecal polyp removed by Dr. Trevor Mace 5 yrs  . History of blood transfusion   . History of cellulitis 08/2010   Left (Since replacemnt of Left knee  . Hyperkalemia 03/2018   started after pt started getting chemo-->had to stop enalapril.  Marland Kitchen Hyperlipidemia    Not on statin b/c lipids "stayed down when sugars came down" per pt.  She says Dr. Chalmers Cater knows she  is not on statin anymore.  . Hypertension   . Hypothyroidism   . Nephrolithiasis 4/07  . Obesity   . OSA on CPAP   . Osteoarthritis    knees, ankle, + ? scapholunate ligament disruption (x-ray 06/2017)--ortho referral.  . Pancytopenia due to antineoplastic chemotherapy ([place])    progressive as of 06/2018. Stable 08/2018.  Marland Kitchen Recurrent UTI    started cipro 250 qd 09/2018  . Sciatica of left side   . Severe persistent asthma    cough-variant--saw Allergist 05/25/16 and was switched from max dose advair to symbicort.  . Systolic murmur    ECHO fine 10/2016-->murmur likely flow murmur assoc with HTN.  Marland Kitchen Zoon's vulvitis    Bx-proven (GYN) -lichen sclerosis.  Clobetasol 0.05% ointment per GYN    Past Surgical History:  Procedure Laterality Date  . ANKLE FRACTURE SURGERY  1984   Pin & repair  . ARTHROSCOPIC REPAIR ACL  2000   Due to ACL tear  . ARTHROSCOPIC REPAIR ACL  5/08  . Blateral knee rerlacements x4 2 times each knee    . BREAST BIOPSY Right 2014   Benign  . CARDIAC CATHETERIZATION  5/07   clear vessel mild mitral   . CARPAL TUNNEL RELEASE Right [id number]   1989 Left  . CESAREAN SECTION  1985  .  CHOLECYSTECTOMY OPEN  1988  . COLONOSCOPY N/A 02/25/2013   Tubular adenoma x 1: Recall 5 yrs. Procedure: COLONOSCOPY;  Surgeon: Juanita Craver, MD;  Location: [facility];  Service: Endoscopy;  Laterality: N/A;  . COMBINED HYSTEROSCOPY DIAGNOSTIC / D&C  2/12   Bx neg  . DEXA  08/2007   Bone density normal.  . DILATION AND CURETTAGE OF UTERUS  12/11  . IR IMAGING GUIDED PORT INSERTION  03/16/2018  . IR REMOVAL TUN ACCESS W/ PORT W/O FL MOD SED  09/20/2018  . LYMPH NODE BIOPSY N/A 02/19/2018   Procedure: Sentinel LYMPH NODE BIOPSY;  Surgeon: Everitt Amber, MD;  Location: [facility];  Service: Gynecology;  Laterality: N/A;  . REPLACEMENT TOTAL KNEE Left 5/12   X 2 on each  . ROBOTIC ASSISTED TOTAL HYSTERECTOMY WITH BILATERAL SALPINGO OOPHERECTOMY N/A 02/19/2018   For  endometrial cancer.  Procedure: XI ROBOTIC ASSISTED TOTAL HYSTERECTOMY WITH BILATERAL SALPINGO OOPHORECTOMY;  Surgeon: Everitt Amber, MD;  Location: [facility];  Service: Gynecology;  Laterality: N/A;  . TRANSTHORACIC ECHOCARDIOGRAM  10/11/2016    EF 55-60%, grd I DD.  . TUBAL LIGATION  1985   C-Section    There were no vitals filed for this visit.   Subjective Assessment - 02/04/20 1402    Subjective Pt. noting weather change has made her arthritis in her back and foot flare-up increasing her overall pain.    Pertinent History anemia, DDD, DM, L foot fx and pinning, GERD, HLD, HTN, B TKA, OA R foot and wrist, asthma, systolic murmur, ACL repair, L sciatica, hypothyroidism, endometrial CA in remission 08/2018, chemo-induced neuropathy    Diagnostic tests none recent    Patient Stated Goals improve pain    Currently in Pain? Yes    Pain Score 10-Worst pain ever    Pain Location Back    Pain Orientation Lower;Mid;Medial    Pain Descriptors / Indicators Aching    Pain Type Acute pain    Pain Radiating Towards numbness/burning into L medial foot and R foot (R>L)    Pain Onset More than a month ago    Pain Frequency Constant    Aggravating Factors  poor weather    Multiple Pain Sites No                             [facility] Adult PT Treatment/Exercise - 02/04/20 0001      Lumbar Exercises: Seated   Other Seated Lumbar Exercises Alteranting seated lumbar rotation with yardstick 5 x 3 " hold       Knee/Hip Exercises: Stretches   Passive Hamstring Stretch Right;Left;2 reps;30 seconds    Passive Hamstring Stretch Limitations seated with heel prop on 8" step       Knee/Hip Exercises: Aerobic   Nustep Lvl 4, 6 min (LE/UE)      Knee/Hip Exercises: Standing   Knee Flexion Right;Left;10 reps    Knee Flexion Limitations standing leaning over bolster on table     Hip Flexion Right;Left;10 reps;Knee bent    Hip Flexion Limitations standing at mat table and chair  for support     Hip Extension Right;Left;10 reps;Knee straight    Extension Limitations leaning over mat table       Knee/Hip Exercises: Seated   Sit to Sand 1 set;10 reps;without UE support   from standard chair without AR     Manual Therapy   Manual Therapy Soft tissue mobilization  Manual therapy comments standing leaning over bolster     Soft tissue mobilization STM to L buttocks    Myofascial Release TPR to L buttocks                     PT Short Term Goals - 01/21/20 1413      PT SHORT TERM GOAL #1   Title Pt will be independent with HEP     Time 3    Period Weeks    Status Achieved    Target Date 01/22/20             PT Long Term Goals - 01/28/20 1428      PT LONG TERM GOAL #1   Title Patient to be independent with advanced HEP.    Time 8    Period Weeks    Status On-going      PT LONG TERM GOAL #2   Title Patient to demsontrate lumbar AROM in all planes with <5/10 pain.    Time 8    Period Weeks    Status On-going      PT LONG TERM GOAL #3   Title Patient to score <20 sec on TUG with LRAD to decrease risk of falls.    Time 8    Period Weeks    Status Achieved   01/28/20:  met with SPC     PT LONG TERM GOAL #4   Title Patient to demonstrate B LE strength >/=4+/5.    Time 8    Period Weeks    Status On-going      PT LONG TERM GOAL #5   Title Patient to report tolerance for navigating upstairs to access her bathroom without assistance.    Time 8    Period Weeks    Status On-going      PT LONG TERM GOAL #6   Title Patient to score 48/56 on Berg to demonstrate decreased risk of falls.    Time 8    Period Weeks    Status On-going                 Plan - 02/04/20 1406    Clinical Impression Statement [name] reporting increased LBP which she attributes to colder weather.  Did complaint of L buttocks tenderness after NuStep warmup in session thus performed STM/DTM with wall with some relief noted following.  Pt. reports she has  deferred the HEP step-up exercise the last few days due to increased pain however plans to return to performing this exercise for improved stair navigation ability.  Progressed standing proximal hip strengthening therex as pt. tolerated today with LBP nearly unchanged in session.  Did trial moist heat to lumbar spine in seated position to end session with pt. noting some LBP improvement.  May consider further use of this modality in coming sessions.     Comorbidities anemia, DDD, DM, L foot fx and pinning, GERD, HLD, HTN, B TKA, OA R foot and wrist, asthma, systolic murmur, ACL repair, L sciatica, hypothyroidism, endometrial CA in remission 08/2018, chemo-induced neuropathy    Rehab Potential Good    PT Frequency 2x / week    PT Duration 8 weeks    PT Treatment/Interventions ADLs/Self Care Home Management;Cryotherapy;Electrical Stimulation;Moist Heat;Balance training;Therapeutic exercise;Therapeutic activities;Functional mobility training;Stair training;Gait training;DME Instruction;Neuromuscular re-education;Patient/family education;Manual techniques;Taping;Energy conservation;Dry needling;Passive range of motion    PT Next Visit Plan Progress sitting/standing LE stretching and strengthening to tolerance    Consulted and Agree with Plan of  Care Patient           Patient will benefit from skilled therapeutic intervention in order to improve the following deficits and impairments:  Decreased endurance, Decreased activity tolerance, Decreased strength, Increased fascial restricitons, Pain, Decreased balance, Decreased mobility, Difficulty walking, Increased muscle spasms, Improper body mechanics, Decreased range of motion, Impaired flexibility, Postural dysfunction  Visit Diagnosis: Chronic bilateral low back pain with bilateral sciatica  Pain in left hip  Pain in right hip  Difficulty in walking, not elsewhere classified  Unsteadiness on feet     Problem List Patient Active Problem List     Diagnosis Date Noted  . Type 2 diabetes, uncontrolled, with retinopathy ([place]) 05/07/2019  . Pancytopenia, acquired ([place]) 04/30/2018  . Bilateral edema of lower extremity 04/09/2018  . Peripheral neuropathy due to chemotherapy ([place]) 04/09/2018  . Endometrial cancer ([place]) 02/19/2018  . Morbid obesity ([place]) 02/19/2018  . Chronic pain 11/03/2017  . Scapholunate ligament injury, no instability 06/29/2017  . HTN (hypertension) 09/27/2016  . Dyspnea 09/27/2016  . Shingles outbreak 10/20/2015  . 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  . Hypothyroidism 07/10/2015  . OSA (obstructive sleep apnea) 07/10/2015  . Endometrial hyperplasia 07/02/2015  . Bronchitis 06/01/2015  . Cough variant asthma 11/02/2014  . Obesity     Bess Harvest, [place] 02/04/20 6:20 PM   [facility] [address]  [place], [place], [address] Phone: [phone]   Fax:  [phone]  Name: [name] MRN: [id number] Date of Birth: [birth year]

## 2020-02-06 ENCOUNTER — Ambulatory Visit: Payer: Medicare Other | Admitting: Physical Therapy

## 2020-02-11 ENCOUNTER — Other Ambulatory Visit: Payer: Self-pay | Admitting: Gastroenterology

## 2020-02-11 ENCOUNTER — Other Ambulatory Visit: Payer: Self-pay

## 2020-02-11 ENCOUNTER — Ambulatory Visit: Payer: Medicare Other | Attending: Family Medicine | Admitting: Physical Therapy

## 2020-02-11 ENCOUNTER — Encounter: Payer: Self-pay | Admitting: Physical Therapy

## 2020-02-11 DIAGNOSIS — M25552 Pain in left hip: Secondary | ICD-10-CM | POA: Insufficient documentation

## 2020-02-11 DIAGNOSIS — M25551 Pain in right hip: Secondary | ICD-10-CM | POA: Insufficient documentation

## 2020-02-11 DIAGNOSIS — R262 Difficulty in walking, not elsewhere classified: Secondary | ICD-10-CM | POA: Diagnosis not present

## 2020-02-11 DIAGNOSIS — Z86010 Personal history of colonic polyps: Secondary | ICD-10-CM | POA: Diagnosis not present

## 2020-02-11 DIAGNOSIS — R2681 Unsteadiness on feet: Secondary | ICD-10-CM | POA: Diagnosis not present

## 2020-02-11 DIAGNOSIS — G8929 Other chronic pain: Secondary | ICD-10-CM | POA: Diagnosis not present

## 2020-02-11 DIAGNOSIS — M5441 Lumbago with sciatica, right side: Secondary | ICD-10-CM | POA: Diagnosis not present

## 2020-02-11 DIAGNOSIS — Z1211 Encounter for screening for malignant neoplasm of colon: Secondary | ICD-10-CM | POA: Diagnosis not present

## 2020-02-11 DIAGNOSIS — M5442 Lumbago with sciatica, left side: Secondary | ICD-10-CM | POA: Diagnosis not present

## 2020-02-11 DIAGNOSIS — R194 Change in bowel habit: Secondary | ICD-10-CM | POA: Diagnosis not present

## 2020-02-11 NOTE — Therapy (Signed)
[facility] [address]  [place], [place], [address] Phone: [phone]   Fax:  [phone]  Physical Therapy Treatment  Patient Details  Name: [name] MRN: [id number] Date of Birth: [birth year] Referring Provider (PT): Shawnie Dapper, MD   Encounter Date: 02/11/2020   PT End of Session - 02/11/20 1442    Visit Number 9    Number of Visits 17    Date for PT Re-Evaluation 02/26/20    Authorization Type Medicare & Generic    PT Start Time 1357    PT Stop Time 1448   moist heat   PT Time Calculation (min) 51 min    Equipment Utilized During Treatment Gait belt    Activity Tolerance Patient tolerated treatment well;Patient limited by pain    Behavior During Therapy [facility] for tasks assessed/performed           Past Medical History:  Diagnosis Date  . Allergic rhinitis    Allergy testing: results pending as of 05/28/16 (Dr. Harold Hedge)  . Anemia   . Arthritis   . Chemotherapy-induced neuropathy ([place]) 2019  . Cough variant asthma   . DDD (degenerative disc disease), lumbar   . Diabetes mellitus with complication (HCC)    Mild nonprolif DR R eye 12/2017-->referred to retinal specialist DM MANAGED BY DR. GHERGHE AS OF 2021  . Endometrial cancer ([place]) 02/2018   REMISSION AS OF 08/2018.  Stage III (T3, Nx, Mx)  Path: endometroid adenocarcinoma involving cervix, uterus, and fallopian tubes (Pt got TAH & BSO 02/2018). No sign of recurrence as of onc f/u 05/2019.  Marland Kitchen GERD (gastroesophageal reflux disease)   . Hemorrhoids   . Herpes zoster 10/2015   L side belt-line  . History of adenomatous polyp of colon 02/25/13   5 mm cecal polyp removed by Dr. Trevor Mace 5 yrs  . History of blood transfusion   . History of cellulitis 08/2010   Left (Since replacemnt of Left knee  . Hyperkalemia 03/2018   started after pt started getting chemo-->had to stop enalapril.  Marland Kitchen Hyperlipidemia    Not on statin b/c lipids "stayed down  when sugars came down" per pt.  She says Dr. Chalmers Cater knows she is not on statin anymore.  . Hypertension   . Hypothyroidism   . Nephrolithiasis 4/07  . Obesity   . OSA on CPAP   . Osteoarthritis    knees, ankle, + ? scapholunate ligament disruption (x-ray 06/2017)--ortho referral.  . Pancytopenia due to antineoplastic chemotherapy ([place])    progressive as of 06/2018. Stable 08/2018.  Marland Kitchen Recurrent UTI    started cipro 250 qd 09/2018  . Sciatica of left side   . Severe persistent asthma    cough-variant--saw Allergist 05/25/16 and was switched from max dose advair to symbicort.  . Systolic murmur    ECHO fine 10/2016-->murmur likely flow murmur assoc with HTN.  Marland Kitchen Zoon's vulvitis    Bx-proven (GYN) -lichen sclerosis.  Clobetasol 0.05% ointment per GYN    Past Surgical History:  Procedure Laterality Date  . ANKLE FRACTURE SURGERY  1984   Pin & repair  . ARTHROSCOPIC REPAIR ACL  2000   Due to ACL tear  . ARTHROSCOPIC REPAIR ACL  5/08  . Blateral knee rerlacements x4 2 times each knee    . BREAST BIOPSY Right 2014   Benign  . CARDIAC CATHETERIZATION  5/07   clear vessel mild mitral   . CARPAL TUNNEL RELEASE Right [id number]  1989 Left  . CESAREAN SECTION  1985  . CHOLECYSTECTOMY OPEN  1988  . COLONOSCOPY N/A 02/25/2013   Tubular adenoma x 1: Recall 5 yrs. Procedure: COLONOSCOPY;  Surgeon: Juanita Craver, MD;  Location: [facility];  Service: Endoscopy;  Laterality: N/A;  . COMBINED HYSTEROSCOPY DIAGNOSTIC / D&C  2/12   Bx neg  . DEXA  08/2007   Bone density normal.  . DILATION AND CURETTAGE OF UTERUS  12/11  . IR IMAGING GUIDED PORT INSERTION  03/16/2018  . IR REMOVAL TUN ACCESS W/ PORT W/O FL MOD SED  09/20/2018  . LYMPH NODE BIOPSY N/A 02/19/2018   Procedure: Sentinel LYMPH NODE BIOPSY;  Surgeon: Everitt Amber, MD;  Location: [facility];  Service: Gynecology;  Laterality: N/A;  . REPLACEMENT TOTAL KNEE Left 5/12   X 2 on each  . ROBOTIC ASSISTED TOTAL HYSTERECTOMY WITH  BILATERAL SALPINGO OOPHERECTOMY N/A 02/19/2018   For endometrial cancer.  Procedure: XI ROBOTIC ASSISTED TOTAL HYSTERECTOMY WITH BILATERAL SALPINGO OOPHORECTOMY;  Surgeon: Everitt Amber, MD;  Location: [facility];  Service: Gynecology;  Laterality: N/A;  . TRANSTHORACIC ECHOCARDIOGRAM  10/11/2016    EF 55-60%, grd I DD.  . TUBAL LIGATION  1985   C-Section    There were no vitals filed for this visit.   Subjective Assessment - 02/11/20 1400    Subjective Reports that she is doing a litttle better as far as moving around, but the pain is still there. Notes that her balance has maybe gotten a little better.    Patient is accompained by: Family member   daughter   Pertinent History anemia, DDD, DM, L foot fx and pinning, GERD, HLD, HTN, B TKA, OA R foot and wrist, asthma, systolic murmur, ACL repair, L sciatica, hypothyroidism, endometrial CA in remission 08/2018, chemo-induced neuropathy    How long can you walk comfortably[facility]   Diagnostic tests none recent    Patient Stated Goals improve pain    Currently in Pain? Yes    Pain Score 8    8.5   Pain Location Back    Pain Orientation Lower    Pain Descriptors / Indicators Aching    Pain Type Chronic pain    Pain Radiating Towards B hips                             [facility] Adult PT Treatment/Exercise - 02/11/20 0001      Neuro Re-ed    Neuro Re-ed Details  backwards walking along counter top 2x with/without counter support and SPC   decreased L step length     Knee/Hip Exercises: Stretches   GPress photographerRight;Left;1 rep;30 seconds    Gastroc Stretch Limitations runner's stretch at counter      Knee/Hip Exercises: Aerobic   Nustep Lvl 4, 6 min (LE/UE)      Knee/Hip Exercises: Standing   Heel Raises Both;1 set;15 reps    Heel Raises Limitations at counter     Lateral Step Up Right;Left;1 set;5 reps;Hand Hold: 2;Step Height: 4"    Lateral Step Up Limitations at counter   more  challenge on L   Functional Squat 2 sets;5 reps    Functional Squat Limitations very shallow squat at counter    Gait Training along counter top 2x with SPC with focus on increasing L step length and proper SPC sequencing      Knee/Hip Exercises: Seated  Other Seated Knee/Hip Exercises prayer stretch anterior 5x5" to tolerance      Modalities   Modalities Moist Heat      Moist Heat Therapy   Number Minutes Moist Heat 10 Minutes    Moist Heat Location Lumbar Spine                    PT Short Term Goals - 01/21/20 1413      PT SHORT TERM GOAL #1   Title Pt will be independent with HEP     Time 3    Period Weeks    Status Achieved    Target Date 01/22/20             PT Long Term Goals - 01/28/20 1428      PT LONG TERM GOAL #1   Title Patient to be independent with advanced HEP.    Time 8    Period Weeks    Status On-going      PT LONG TERM GOAL #2   Title Patient to demsontrate lumbar AROM in all planes with <5/10 pain.    Time 8    Period Weeks    Status On-going      PT LONG TERM GOAL #3   Title Patient to score <20 sec on TUG with LRAD to decrease risk of falls.    Time 8    Period Weeks    Status Achieved   01/28/20:  met with SPC     PT LONG TERM GOAL #4   Title Patient to demonstrate B LE strength >/=4+/5.    Time 8    Period Weeks    Status On-going      PT LONG TERM GOAL #5   Title Patient to report tolerance for navigating upstairs to access her bathroom without assistance.    Time 8    Period Weeks    Status On-going      PT LONG TERM GOAL #6   Title Patient to score 48/56 on Berg to demonstrate decreased risk of falls.    Time 8    Period Weeks    Status On-going                 Plan - 02/11/20 1443    Clinical Impression Statement Patient reporting improved in functional mobility and balance but still noting LBP. Noting decreased frequency of "near falls" since initial eval, as her R LE is not buckling as often. Worked  on standing LE strengthening with occasional sitting rest breaks to manage pain levels. Patient noting particular difficulty with heel raises today. Initiated shallow mini squats and short lateral step ups. Patient apprehensive to demonstrate higher amplitude movement- likely d/t fear of falling and d/t decreased sensation in R foot. More difficulty demonstrated with L vs. R step ups Took break from standing ther-ex to perform gentle stretching as patient reporting increase in LBP. Overall however, patient is demonstrating a good improvement in tolerance for standing activities compared to initial eval. Ended session with moist heat to LB at end of session to address LB and hip pain. Patient reported good relief from modality and without complaints at end of session.    Comorbidities anemia, DDD, DM, L foot fx and pinning, GERD, HLD, HTN, B TKA, OA R foot and wrist, asthma, systolic murmur, ACL repair, L sciatica, hypothyroidism, endometrial CA in remission 08/2018, chemo-induced neuropathy    Rehab Potential Good    PT Frequency 2x / week  PT Duration 8 weeks    PT Treatment/Interventions ADLs/Self Care Home Management;Cryotherapy;Electrical Stimulation;Moist Heat;Balance training;Therapeutic exercise;Therapeutic activities;Functional mobility training;Stair training;Gait training;DME Instruction;Neuromuscular re-education;Patient/family education;Manual techniques;Taping;Energy conservation;Dry needling;Passive range of motion    PT Next Visit Plan Progress sitting/standing LE stretching and strengthening to tolerance    Consulted and Agree with Plan of Care Patient           Patient will benefit from skilled therapeutic intervention in order to improve the following deficits and impairments:  Decreased endurance, Decreased activity tolerance, Decreased strength, Increased fascial restricitons, Pain, Decreased balance, Decreased mobility, Difficulty walking, Increased muscle spasms, Improper body  mechanics, Decreased range of motion, Impaired flexibility, Postural dysfunction  Visit Diagnosis: Chronic bilateral low back pain with bilateral sciatica  Pain in left hip  Pain in right hip  Difficulty in walking, not elsewhere classified  Unsteadiness on feet     Problem List Patient Active Problem List   Diagnosis Date Noted  . Type 2 diabetes, uncontrolled, with retinopathy ([place]) 05/07/2019  . Pancytopenia, acquired ([place]) 04/30/2018  . Bilateral edema of lower extremity 04/09/2018  . Peripheral neuropathy due to chemotherapy ([place]) 04/09/2018  . Endometrial cancer ([place]) 02/19/2018  . Morbid obesity ([place]) 02/19/2018  . Chronic pain 11/03/2017  . Scapholunate ligament injury, no instability 06/29/2017  . HTN (hypertension) 09/27/2016  . Dyspnea 09/27/2016  . Shingles outbreak 10/20/2015  . 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  . Hypothyroidism 07/10/2015  . OSA (obstructive sleep apnea) 07/10/2015  . Endometrial hyperplasia 07/02/2015  . Bronchitis 06/01/2015  . Cough variant asthma 11/02/2014  . Obesity      Janene Harvey, PT, DPT 02/11/20 2:49 PM   [facility] [address]  [place], [place], [address] Phone: [phone]   Fax:  [phone]  Name: [name] MRN: [id number] Date of Birth: March 24, 1951

## 2020-02-13 ENCOUNTER — Ambulatory Visit: Payer: Medicare Other | Admitting: Physical Therapy

## 2020-02-13 ENCOUNTER — Other Ambulatory Visit: Payer: Self-pay | Admitting: Family Medicine

## 2020-02-13 ENCOUNTER — Encounter: Payer: Self-pay | Admitting: Physical Therapy

## 2020-02-13 ENCOUNTER — Other Ambulatory Visit: Payer: Self-pay

## 2020-02-13 DIAGNOSIS — M25551 Pain in right hip: Secondary | ICD-10-CM

## 2020-02-13 DIAGNOSIS — G8929 Other chronic pain: Secondary | ICD-10-CM | POA: Diagnosis not present

## 2020-02-13 DIAGNOSIS — M5442 Lumbago with sciatica, left side: Secondary | ICD-10-CM | POA: Diagnosis not present

## 2020-02-13 DIAGNOSIS — R262 Difficulty in walking, not elsewhere classified: Secondary | ICD-10-CM

## 2020-02-13 DIAGNOSIS — M25552 Pain in left hip: Secondary | ICD-10-CM | POA: Diagnosis not present

## 2020-02-13 DIAGNOSIS — R2681 Unsteadiness on feet: Secondary | ICD-10-CM

## 2020-02-13 DIAGNOSIS — M5441 Lumbago with sciatica, right side: Secondary | ICD-10-CM | POA: Diagnosis not present

## 2020-02-13 MED ORDER — HYDROCODONE-ACETAMINOPHEN 5-325 MG PO TABS: ORAL_TABLET | ORAL | 0 refills | Status: DC

## 2020-02-13 NOTE — Therapy (Signed)
[facility] [address]  [place], [place], [address] Phone: [phone]   Fax:  [phone]  Physical Therapy Progress Note  Patient Details  Name: [name] MRN: [id number] Date of Birth: March 17, 1951 Referring Provider (PT): Shawnie Dapper, MD   Progress Note Reporting Period 01/01/20 to 02/13/20  See note below for Objective Data and Assessment of Progress/Goals.     Encounter Date: 02/13/2020   PT End of Session - 02/13/20 1628    Visit Number 10    Number of Visits 22    Date for PT Re-Evaluation 03/26/20    Authorization Type Medicare & Generic    PT Start Time 1445    PT Stop Time 1537   moist heat   PT Time Calculation (min) 52 min    Equipment Utilized During Treatment Gait belt    Activity Tolerance Patient tolerated treatment well;Patient limited by pain    Behavior During Therapy WFL for tasks assessed/performed           Past Medical History:  Diagnosis Date  . Allergic rhinitis    Allergy testing: results pending as of 05/28/16 (Dr. Harold Hedge)  . Anemia   . Arthritis   . Chemotherapy-induced neuropathy ([place]) 2019  . Cough variant asthma   . DDD (degenerative disc disease), lumbar   . Diabetes mellitus with complication (HCC)    Mild nonprolif DR R eye 12/2017-->referred to retinal specialist DM MANAGED BY DR. GHERGHE AS OF 2021  . Endometrial cancer ([place]) 02/2018   REMISSION AS OF 08/2018.  Stage III (T3, Nx, Mx)  Path: endometroid adenocarcinoma involving cervix, uterus, and fallopian tubes (Pt got TAH & BSO 02/2018). No sign of recurrence as of onc f/u 05/2019.  Marland Kitchen GERD (gastroesophageal reflux disease)   . Hemorrhoids   . Herpes zoster 10/2015   L side belt-line  . History of adenomatous polyp of colon 02/25/13   5 mm cecal polyp removed by Dr. Trevor Mace 5 yrs  . History of blood transfusion   . History of cellulitis 08/2010   Left (Since replacemnt of Left knee  .  Hyperkalemia 03/2018   started after pt started getting chemo-->had to stop enalapril.  Marland Kitchen Hyperlipidemia    Not on statin b/c lipids "stayed down when sugars came down" per pt.  She says Dr. Chalmers Cater knows she is not on statin anymore.  . Hypertension   . Hypothyroidism   . Nephrolithiasis 4/07  . Obesity   . OSA on CPAP   . Osteoarthritis    knees, ankle, + ? scapholunate ligament disruption (x-ray 06/2017)--ortho referral.  . Pancytopenia due to antineoplastic chemotherapy ([place])    progressive as of 06/2018. Stable 08/2018.  Marland Kitchen Recurrent UTI    started cipro 250 qd 09/2018  . Sciatica of left side   . Severe persistent asthma    cough-variant--saw Allergist 05/25/16 and was switched from max dose advair to symbicort.  . Systolic murmur    ECHO fine 10/2016-->murmur likely flow murmur assoc with HTN.  Marland Kitchen Zoon's vulvitis    Bx-proven (GYN) -lichen sclerosis.  Clobetasol 0.05% ointment per GYN    Past Surgical History:  Procedure Laterality Date  . ANKLE FRACTURE SURGERY  1984   Pin & repair  . ARTHROSCOPIC REPAIR ACL  2000   Due to ACL tear  . ARTHROSCOPIC REPAIR ACL  5/08  . Blateral knee rerlacements x4 2 times each knee    . BREAST BIOPSY Right 2014  Benign  . CARDIAC CATHETERIZATION  5/07   clear vessel mild mitral   . CARPAL TUNNEL RELEASE Right [id number]   1989 Left  . CESAREAN SECTION  1985  . CHOLECYSTECTOMY OPEN  1988  . COLONOSCOPY N/A 02/25/2013   Tubular adenoma x 1: Recall 5 yrs. Procedure: COLONOSCOPY;  Surgeon: Juanita Craver, MD;  Location: [facility];  Service: Endoscopy;  Laterality: N/A;  . COMBINED HYSTEROSCOPY DIAGNOSTIC / D&C  2/12   Bx neg  . DEXA  08/2007   Bone density normal.  . DILATION AND CURETTAGE OF UTERUS  12/11  . IR IMAGING GUIDED PORT INSERTION  03/16/2018  . IR REMOVAL TUN ACCESS W/ PORT W/O FL MOD SED  09/20/2018  . LYMPH NODE BIOPSY N/A 02/19/2018   Procedure: Sentinel LYMPH NODE BIOPSY;  Surgeon: Everitt Amber, MD;  Location: [facility];  Service: Gynecology;  Laterality: N/A;  . REPLACEMENT TOTAL KNEE Left 5/12   X 2 on each  . ROBOTIC ASSISTED TOTAL HYSTERECTOMY WITH BILATERAL SALPINGO OOPHERECTOMY N/A 02/19/2018   For endometrial cancer.  Procedure: XI ROBOTIC ASSISTED TOTAL HYSTERECTOMY WITH BILATERAL SALPINGO OOPHORECTOMY;  Surgeon: Everitt Amber, MD;  Location: [facility];  Service: Gynecology;  Laterality: N/A;  . TRANSTHORACIC ECHOCARDIOGRAM  10/11/2016    EF 55-60%, grd I DD.  . TUBAL LIGATION  1985   C-Section    There were no vitals filed for this visit.   Subjective Assessment - 02/13/20 1448    Subjective Fell yesterday when walking in the family room. [place] forward on her knees. Was able to get up with daughter's and husband's help. Feels like she used muscles that she hasn't used in a while, which is why she is sore in the L arm and L hip, particularly when she walks.    Patient is accompained by: Family member   husband   Pertinent History anemia, DDD, DM, L foot fx and pinning, GERD, HLD, HTN, B TKA, OA R foot and wrist, asthma, systolic murmur, ACL repair, L sciatica, hypothyroidism, endometrial CA in remission 08/2018, chemo-induced neuropathy    Diagnostic tests none recent    Patient Stated Goals improve pain    Currently in Pain? Yes    Pain Location Elbow    Pain Orientation Left    Pain Descriptors / Indicators Sore    Pain Type Acute pain    Multiple Pain Sites Yes    Pain Score 10    Pain Location Hip    Pain Orientation Left;Lateral    Pain Descriptors / Indicators Aching    Pain Type Acute pain    Pain Score 9    Pain Location Elbow    Pain Orientation Left    Pain Descriptors / Indicators Sore    Pain Type Acute pain              [facility] PT Assessment - 02/13/20 0001      Assessment   Medical Diagnosis Chronic bilateral LBP with B sciatica, lumbat spondylosis, lumbar DDD    Referring Provider (PT) Shawnie Dapper, MD    Onset Date/Surgical Date --    chronic of several years     AROM   Lumbar Flexion ankles    Lumbar Extension severely limited    Lumbar - Right Side Bend jt line    Lumbar - Left Side Bend jt line    Lumbar - Right Rotation moderately limited    Lumbar - Left Rotation moderately limited  Strength   Right Hip Flexion 4+/5    Right Hip ABduction 4+/5    Right Hip ADduction 4+/5    Left Hip Flexion 4+/5   pain   Left Hip ABduction 4+/5    Left Hip ADduction 4+/5    Right Knee Flexion 4/5    Right Knee Extension 4+/5    Left Knee Flexion 4+/5    Left Knee Extension 4+/5    Right Ankle Dorsiflexion 4+/5    Right Ankle Plantar Flexion 4/5    Left Ankle Dorsiflexion 4+/5    Left Ankle Plantar Flexion 4+/5      Berg Balance Test   Sit to Stand Able to stand without using hands and stabilize independently    Standing Unsupported Able to stand safely 2 minutes    Sitting with Back Unsupported but Feet Supported on Floor or Stool Able to sit safely and securely 2 minutes    Stand to Sit Sits safely with minimal use of hands    Transfers Able to transfer safely, minor use of hands    Standing Unsupported with Eyes Closed Able to stand 10 seconds safely    Standing Unsupported with Feet Together Able to place feet together independently and stand 1 minute safely    From Standing, Reach Forward with Outstretched Arm Can reach forward >12 cm safely (5")    From Standing Position, Pick up Object from Floor Unable to pick up and needs supervision   limited by L hip pain and imbalance    From Standing Position, Turn to Look Behind Over each Shoulder Looks behind from both sides and weight shifts well    Turn 360 Degrees Able to turn 360 degrees safely but slowly    Standing Unsupported, Alternately Place Feet on Step/Stool Able to complete 4 steps without aid or supervision    Standing Unsupported, One Foot in [employer] balance while stepping or standing    Standing on One Leg Unable to try or needs assist to prevent  fall    Total Score 40      Timed Up and Go Test   Normal TUG (seconds) 19.31   with SPC                        [facility] Adult PT Treatment/Exercise - 02/13/20 0001      Knee/Hip Exercises: Aerobic   Nustep Lvl 4, 4.5 min (LE/UE)      Moist Heat Therapy   Number Minutes Moist Heat 10 Minutes    Moist Heat Location Lumbar Spine                  PT Education - 02/13/20 1628    Education Details discussion on objecitve progress and remaining impairments    Person(s) Educated Patient    Methods Explanation;Demonstration    Comprehension Verbalized understanding            PT Short Term Goals - 02/13/20 1455      PT SHORT TERM GOAL #1   Title Pt will be independent with HEP     Time 3    Period Weeks    Status Achieved    Target Date 01/22/20             PT Long Term Goals - 02/13/20 1455      PT LONG TERM GOAL #1   Title Patient to be independent with advanced HEP.    Time 6    Period  Weeks    Status Partially Met   met for current   Target Date 03/26/20      PT LONG TERM GOAL #2   Title Patient to demsontrate lumbar AROM in all planes with <5/10 pain.    Time 6    Period Weeks    Status Partially Met   improved in flexion, B sidebending, R rotation   Target Date 03/26/20      PT LONG TERM GOAL #3   Title Patient to score <20 sec on TUG with LRAD to decrease risk of falls.    Time 8    Period Weeks    Status Achieved   01/28/20:  met with SPC     PT LONG TERM GOAL #4   Title Patient to demonstrate B LE strength >/=4+/5.    Time 6    Period Weeks    Status Partially Met   improved in R hip abduction, adduction, and ankle dorsiflexion   Target Date 03/26/20      PT LONG TERM GOAL #5   Title Patient to report tolerance for navigating upstairs to access her bathroom without assistance.    Time 6    Period Weeks    Status On-going   reports that she has not attempted d/t R hand arthritis, pain, and fear of falling   Target Date  03/26/20      PT LONG TERM GOAL #6   Title Patient to score 48/56 on Berg to demonstrate decreased risk of falls.    Time 6    Period Weeks    Status On-going   02/13/20: 40/56   Target Date 03/26/20      PT LONG TERM GOAL #7   Title Patient to score <15 seconds on TUG testing with SPC.    Time 6    Period Weeks    Status New    Target Date 03/26/20                 Plan - 02/13/20 1639    Clinical Impression Statement Patient arrived to session reporting that she had a fall yesterday, and able to get up with her husband and daughter's help. Noting soreness in her L elbow and hip, but denies head trauma or bruising. Noting pain with ambulation, thus patient was pushed in w/c rather than ambulating to avoid pain. Overall, patient notes improvement in functional activity tolerance, but no improvement in pain levels thus far. Patient reports compliance with HEP and denies questions. Has not attempted navigating that stairs in her home yet d/t R hand arthritis limiting her ability to use the handrail, pain, and fear of falling. Lumbar AROM has improved in flexion, B sidebending, R rotation. Strength testing revealed improvement in R hip abduction, adduction, and ankle dorsiflexion, with R knee flexion and plantarflexion weakness remaining. Patient has previously met TUG goal, with goal now being updated to further improve gait speed. Berg score appeared to decline slightly as patient noted increased pain by the end of the session. Ended session with moist heat to LB for pain relief. Patient showing good progress towards goals despite chronic pain and acute exacerbation of pain since yesterday's fall. Patient would benefit from continued skilled PT services 2x/week for 6 weeks to address remaining goals and return to PLOF.    Comorbidities anemia, DDD, DM, L foot fx and pinning, GERD, HLD, HTN, B TKA, OA R foot and wrist, asthma, systolic murmur, ACL repair, L sciatica, hypothyroidism,  endometrial CA in  remission 08/2018, chemo-induced neuropathy    Rehab Potential Good    PT Frequency 2x / week    PT Duration 6 weeks    PT Treatment/Interventions ADLs/Self Care Home Management;Cryotherapy;Electrical Stimulation;Moist Heat;Balance training;Therapeutic exercise;Therapeutic activities;Functional mobility training;Stair training;Gait training;DME Instruction;Neuromuscular re-education;Patient/family education;Manual techniques;Taping;Energy conservation;Dry needling;Passive range of motion    PT Next Visit Plan Progress sitting/standing LE stretching and strengthening to tolerance, stairs    Consulted and Agree with Plan of Care Patient           Patient will benefit from skilled therapeutic intervention in order to improve the following deficits and impairments:  Decreased endurance, Decreased activity tolerance, Decreased strength, Increased fascial restricitons, Pain, Decreased balance, Decreased mobility, Difficulty walking, Increased muscle spasms, Improper body mechanics, Decreased range of motion, Impaired flexibility, Postural dysfunction  Visit Diagnosis: Chronic bilateral low back pain with bilateral sciatica  Pain in left hip  Pain in right hip  Difficulty in walking, not elsewhere classified  Unsteadiness on feet     Problem List Patient Active Problem List   Diagnosis Date Noted  . Type 2 diabetes, uncontrolled, with retinopathy ([place]) 05/07/2019  . Pancytopenia, acquired ([place]) 04/30/2018  . Bilateral edema of lower extremity 04/09/2018  . Peripheral neuropathy due to chemotherapy ([place]) 04/09/2018  . Endometrial cancer ([place]) 02/19/2018  . Morbid obesity ([place]) 02/19/2018  . Chronic pain 11/03/2017  . Scapholunate ligament injury, no instability 06/29/2017  . HTN (hypertension) 09/27/2016  . Dyspnea 09/27/2016  . Shingles outbreak 10/20/2015  . 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  . Hypothyroidism 07/10/2015  . OSA (obstructive sleep apnea) 07/10/2015  .  Endometrial hyperplasia 07/02/2015  . Bronchitis 06/01/2015  . Cough variant asthma 11/02/2014  . Obesity      Janene Harvey, PT, DPT 02/13/20 4:42 PM   [facility] [address]  [place], [place], [address] Phone: [phone]   Fax:  [phone]  Name: [name] MRN: [id number] Date of Birth: 12-Oct-1950

## 2020-02-13 NOTE — Telephone Encounter (Signed)
Patient made aware refills submitted for both medications requested.

## 2020-02-13 NOTE — Telephone Encounter (Signed)
RF request for amitriptytline LOV:12/18/19 Next ov: advised to f/u 2 months  Last written:12/18/19(30,1)  Requesting: Vicodin Contract:12/18/19 UDS:12/18/19 Last Visit:12/18/19 Next Visit:advised to f/u 2 months Last Refill:12/19/19(60,0)  Please Advise. Medication pending

## 2020-02-13 NOTE — Telephone Encounter (Signed)
Patient called to add medication refill request to order that was sent over from pharmacy.  HYDROcodone-acetaminophen (NORCO/VICODIN) 5-325 MG tablet [[id number]]

## 2020-02-25 ENCOUNTER — Encounter: Payer: Self-pay | Admitting: Physical Therapy

## 2020-02-25 ENCOUNTER — Other Ambulatory Visit: Payer: Self-pay

## 2020-02-25 ENCOUNTER — Ambulatory Visit: Payer: Medicare Other | Admitting: Physical Therapy

## 2020-02-25 DIAGNOSIS — R262 Difficulty in walking, not elsewhere classified: Secondary | ICD-10-CM

## 2020-02-25 DIAGNOSIS — M25551 Pain in right hip: Secondary | ICD-10-CM | POA: Diagnosis not present

## 2020-02-25 DIAGNOSIS — M5442 Lumbago with sciatica, left side: Secondary | ICD-10-CM | POA: Diagnosis not present

## 2020-02-25 DIAGNOSIS — R2681 Unsteadiness on feet: Secondary | ICD-10-CM

## 2020-02-25 DIAGNOSIS — M25552 Pain in left hip: Secondary | ICD-10-CM | POA: Diagnosis not present

## 2020-02-25 DIAGNOSIS — G8929 Other chronic pain: Secondary | ICD-10-CM

## 2020-02-25 DIAGNOSIS — M5441 Lumbago with sciatica, right side: Secondary | ICD-10-CM | POA: Diagnosis not present

## 2020-02-25 NOTE — Therapy (Signed)
[facility] [address]  [place], [place], [address] Phone: [phone]   Fax:  [phone]  Physical Therapy Treatment  Patient Details  Name: [name] MRN: [id number] Date of Birth: Oct 22, 1950 Referring Provider (PT): Shawnie Dapper, MD   Encounter Date: 02/25/2020   PT End of Session - 02/25/20 1442    Visit Number 11    Number of Visits 22    Date for PT Re-Evaluation 03/26/20    Authorization Type Medicare & Generic    PT Start Time 1353    PT Stop Time 1442    PT Time Calculation (min) 49 min    Activity Tolerance Patient tolerated treatment well;Patient limited by pain    Behavior During Therapy [facility] for tasks assessed/performed           Past Medical History:  Diagnosis Date   Allergic rhinitis    Allergy testing: results pending as of 05/28/16 (Dr. Harold Hedge)   Anemia    Arthritis    Chemotherapy-induced neuropathy ([place]) 2019   Cough variant asthma    DDD (degenerative disc disease), lumbar    Diabetes mellitus with complication ([place])    Mild nonprolif DR R eye 12/2017-->referred to retinal specialist DM MANAGED BY DR. GHERGHE AS OF 2021   Endometrial cancer ([place]) 02/2018   REMISSION AS OF 08/2018.  Stage III (T3, Nx, Mx)  Path: endometroid adenocarcinoma involving cervix, uterus, and fallopian tubes (Pt got TAH & BSO 02/2018). No sign of recurrence as of onc f/u 05/2019.   GERD (gastroesophageal reflux disease)    Hemorrhoids    Herpes zoster 10/2015   L side belt-line   History of adenomatous polyp of colon 02/25/13   5 mm cecal polyp removed by Dr. Trevor Mace 5 yrs   History of blood transfusion    History of cellulitis 08/2010   Left (Since replacemnt of Left knee   Hyperkalemia 03/2018   started after pt started getting chemo-->had to stop enalapril.   Hyperlipidemia    Not on statin b/c lipids "stayed down when sugars came down" per pt.  She says Dr. Chalmers Cater knows  she is not on statin anymore.   Hypertension    Hypothyroidism    Nephrolithiasis 4/07   Obesity    OSA on CPAP    Osteoarthritis    knees, ankle, + ? scapholunate ligament disruption (x-ray 06/2017)--ortho referral.   Pancytopenia due to antineoplastic chemotherapy ([place])    progressive as of 06/2018. Stable 08/2018.   Recurrent UTI    started cipro 250 qd 09/2018   Sciatica of left side    Severe persistent asthma    cough-variant--saw Allergist 05/25/16 and was switched from max dose advair to symbicort.   Systolic murmur    ECHO fine 10/2016-->murmur likely flow murmur assoc with HTN.   Zoon's vulvitis    Bx-proven (GYN) -lichen sclerosis.  Clobetasol 0.05% ointment per GYN    Past Surgical History:  Procedure Laterality Date   ANKLE FRACTURE SURGERY  1984   Pin & repair   ARTHROSCOPIC REPAIR ACL  2000   Due to ACL tear   ARTHROSCOPIC REPAIR ACL  5/08   Blateral knee rerlacements x4 2 times each knee     BREAST BIOPSY Right 2014   Benign   CARDIAC CATHETERIZATION  5/07   clear vessel mild mitral    CARPAL TUNNEL RELEASE Right 1984;1989   [place]  CHOLECYSTECTOMY OPEN  1988   COLONOSCOPY N/A 02/25/2013   Tubular adenoma x 1: Recall 5 yrs. Procedure: COLONOSCOPY;  Surgeon: Juanita Craver, MD;  Location: [facility];  Service: Endoscopy;  Laterality: N/A;   COMBINED HYSTEROSCOPY DIAGNOSTIC / D&C  2/12   Bx neg   DEXA  08/2007   Bone density normal.   DILATION AND CURETTAGE OF UTERUS  12/11   IR IMAGING GUIDED PORT INSERTION  03/16/2018   IR REMOVAL TUN ACCESS W/ PORT W/O FL MOD SED  09/20/2018   LYMPH NODE BIOPSY N/A 02/19/2018   Procedure: Sentinel LYMPH NODE BIOPSY;  Surgeon: Everitt Amber, MD;  Location: [facility];  Service: Gynecology;  Laterality: N/A;   REPLACEMENT TOTAL KNEE Left 5/12   X 2 on each   ROBOTIC ASSISTED TOTAL HYSTERECTOMY WITH BILATERAL SALPINGO OOPHERECTOMY N/A 02/19/2018   For  endometrial cancer.  Procedure: XI ROBOTIC ASSISTED TOTAL HYSTERECTOMY WITH BILATERAL SALPINGO OOPHORECTOMY;  Surgeon: Everitt Amber, MD;  Location: [facility];  Service: Gynecology;  Laterality: N/A;   TRANSTHORACIC ECHOCARDIOGRAM  10/11/2016    EF 55-60%, grd I DD.   TUBAL LIGATION  1985   C-Section    There were no vitals filed for this visit.   Subjective Assessment - 02/25/20 1355    Subjective Feeling mentally exhausted today d/t her husband getting in a MVA recently. Has had 5 near falls today d/t being tired.    Patient is accompained by: Family member   daughter   Pertinent History anemia, DDD, DM, L foot fx and pinning, GERD, HLD, HTN, B TKA, OA R foot and wrist, asthma, systolic murmur, ACL repair, L sciatica, hypothyroidism, endometrial CA in remission 08/2018, chemo-induced neuropathy    Diagnostic tests none recent    Patient Stated Goals improve pain    Currently in Pain? Yes    Pain Score 9    9.75/10   Pain Location Back    Pain Orientation Right;Left;Lower    Pain Descriptors / Indicators Aching    Pain Type Chronic pain    Pain Radiating Towards B hips                             [facility] Adult PT Treatment/Exercise - 02/25/20 0001      Lumbar Exercises: Seated   Other Seated Lumbar Exercises pelvic tilts 10x   cueing for proper movement pattern     Knee/Hip Exercises: Stretches   Active Hamstring Stretch --   more challenge on L   Passive Hamstring Stretch Right;Left;2 reps;30 seconds    Passive Hamstring Stretch Limitations seated with heel prop on 8" step     Other Knee/Hip Stretches R/L modified ER stretch 30" each      Knee/Hip Exercises: Aerobic   Nustep Lvl 4, 6 min (LE/UE)      Knee/Hip Exercises: Supine   Bridges Strengthening;Left;Both;5 reps    Bridges Limitations attempted bridge and glute sets, however pt limited by HS cramps    Other Supine Knee/Hip Exercises hooklying clams with green TB x10    Other Supine  Knee/Hip Exercises hooklying adduction ball squeeze 5x5"      Manual Therapy   Manual Therapy Passive ROM;Soft tissue mobilization    Soft tissue mobilization STM and IASTM to L piriformis, proximal glutes, and lateral glutes    Myofascial Release manual TPR to L piriformis, proximal glutes, and lateral glutes    Passive ROM L hip KTOS  30", L hip figure 4 30"; R/L HS stretch 30"                    PT Short Term Goals - 02/13/20 1455      PT SHORT TERM GOAL #1   Title Pt will be independent with HEP     Time 3    Period Weeks    Status Achieved    Target Date 01/22/20             PT Long Term Goals - 02/13/20 1455      PT LONG TERM GOAL #1   Title Patient to be independent with advanced HEP.    Time 6    Period Weeks    Status Partially Met   met for current   Target Date 03/26/20      PT LONG TERM GOAL #2   Title Patient to demsontrate lumbar AROM in all planes with <5/10 pain.    Time 6    Period Weeks    Status Partially Met   improved in flexion, B sidebending, R rotation   Target Date 03/26/20      PT LONG TERM GOAL #3   Title Patient to score <20 sec on TUG with LRAD to decrease risk of falls.    Time 8    Period Weeks    Status Achieved   01/28/20:  met with SPC     PT LONG TERM GOAL #4   Title Patient to demonstrate B LE strength >/=4+/5.    Time 6    Period Weeks    Status Partially Met   improved in R hip abduction, adduction, and ankle dorsiflexion   Target Date 03/26/20      PT LONG TERM GOAL #5   Title Patient to report tolerance for navigating upstairs to access her bathroom without assistance.    Time 6    Period Weeks    Status On-going   reports that she has not attempted d/t R hand arthritis, pain, and fear of falling   Target Date 03/26/20      PT LONG TERM GOAL #6   Title Patient to score 48/56 on Berg to demonstrate decreased risk of falls.    Time 6    Period Weeks    Status On-going   02/13/20: 40/56   Target Date  03/26/20      PT LONG TERM GOAL #7   Title Patient to score <15 seconds on TUG testing with SPC.    Time 6    Period Weeks    Status New    Target Date 03/26/20                 Plan - 02/25/20 1443    Clinical Impression Statement Patient reporting 5 near-falls today d/t being fatigued. Opted for mat and sitting ther-ex today to ensure patient safety. Patient with difficulty performing pelvic tilts, particularly with posterior tilt. Patient also with c/o more discomfort and difficulty in L vs R hip with stretching. Able to tolerate passive L hip stretching for hopeful relief of patients c/o sciatica pain. Attempted bridge and glute sets, however patient limited by HS cramps, thus this was discontinued. Addressed patients pain with STM to L piriformis and glutes, with several tender and palpable trigger points evident. Patient noted slight improvement in pain at end of session.    Comorbidities anemia, DDD, DM, L foot fx and pinning, GERD, HLD, HTN, B TKA, OA R  foot and wrist, asthma, systolic murmur, ACL repair, L sciatica, hypothyroidism, endometrial CA in remission 08/2018, chemo-induced neuropathy    Rehab Potential Good    PT Frequency 2x / week    PT Duration 6 weeks    PT Treatment/Interventions ADLs/Self Care Home Management;Cryotherapy;Electrical Stimulation;Moist Heat;Balance training;Therapeutic exercise;Therapeutic activities;Functional mobility training;Stair training;Gait training;DME Instruction;Neuromuscular re-education;Patient/family education;Manual techniques;Taping;Energy conservation;Dry needling;Passive range of motion    PT Next Visit Plan Progress sitting/standing LE stretching and strengthening to tolerance, stairs    Consulted and Agree with Plan of Care Patient           Patient will benefit from skilled therapeutic intervention in order to improve the following deficits and impairments:  Decreased endurance, Decreased activity tolerance, Decreased  strength, Increased fascial restricitons, Pain, Decreased balance, Decreased mobility, Difficulty walking, Increased muscle spasms, Improper body mechanics, Decreased range of motion, Impaired flexibility, Postural dysfunction  Visit Diagnosis: Chronic bilateral low back pain with bilateral sciatica  Pain in left hip  Pain in right hip  Difficulty in walking, not elsewhere classified  Unsteadiness on feet     Problem List Patient Active Problem List   Diagnosis Date Noted   Type 2 diabetes, uncontrolled, with retinopathy ([place]) 05/07/2019   Pancytopenia, acquired ([place]) 04/30/2018   Bilateral edema of lower extremity 04/09/2018   Peripheral neuropathy due to chemotherapy ([place]) 04/09/2018   Endometrial cancer ([place]) 02/19/2018   Morbid obesity ([place]) 02/19/2018   Chronic pain 11/03/2017   Scapholunate ligament injury, no instability 06/29/2017   HTN (hypertension) 09/27/2016   Dyspnea 09/27/2016   Shingles outbreak 10/20/2015   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   Hypothyroidism 07/10/2015   OSA (obstructive sleep apnea) 07/10/2015   Endometrial hyperplasia 07/02/2015   Bronchitis 06/01/2015   Cough variant asthma 11/02/2014   Obesity      Janene Harvey, PT, DPT 02/25/20 2:47 PM   [facility] [address]  [place], [place], [address] Phone: [phone]   Fax:  [phone]  Name: SIGNORA ZUCCO MRN: [id number] Date of Birth: 08-Nov-1950

## 2020-02-26 ENCOUNTER — Other Ambulatory Visit: Payer: Self-pay | Admitting: Family Medicine

## 2020-02-27 ENCOUNTER — Ambulatory Visit
Admission: RE | Admit: 2020-02-27 | Discharge: 2020-02-27 | Disposition: A | Discharge status: Home / Self Care | Payer: Medicare Other | Source: Ambulatory Visit | Attending: Radiation Oncology | Admitting: Radiation Oncology

## 2020-02-27 ENCOUNTER — Other Ambulatory Visit: Payer: Self-pay

## 2020-02-27 ENCOUNTER — Other Ambulatory Visit: Payer: Self-pay | Admitting: Family Medicine

## 2020-02-27 ENCOUNTER — Encounter: Payer: Self-pay | Admitting: Radiation Oncology

## 2020-02-27 DIAGNOSIS — M25551 Pain in right hip: Secondary | ICD-10-CM | POA: Insufficient documentation

## 2020-02-27 DIAGNOSIS — C541 Malignant neoplasm of endometrium: Secondary | ICD-10-CM

## 2020-02-27 DIAGNOSIS — N765 Ulceration of vagina: Secondary | ICD-10-CM | POA: Diagnosis not present

## 2020-02-27 DIAGNOSIS — Z8542 Personal history of malignant neoplasm of other parts of uterus: Secondary | ICD-10-CM | POA: Diagnosis not present

## 2020-02-27 DIAGNOSIS — Z79899 Other long term (current) drug therapy: Secondary | ICD-10-CM | POA: Diagnosis not present

## 2020-02-27 DIAGNOSIS — M25552 Pain in left hip: Secondary | ICD-10-CM | POA: Insufficient documentation

## 2020-02-27 DIAGNOSIS — R262 Difficulty in walking, not elsewhere classified: Secondary | ICD-10-CM | POA: Diagnosis not present

## 2020-02-27 DIAGNOSIS — Z08 Encounter for follow-up examination after completed treatment for malignant neoplasm: Secondary | ICD-10-CM | POA: Diagnosis not present

## 2020-02-27 NOTE — Progress Notes (Signed)
Patient here for 3 month f/u with Dr. Sondra Come. Has pain in both hips "9". Reports a tender area on the left side of her labia. She reports urine incontinence which is not new. She has occ episodes of diarrhea.  BP (!) 149/58 (BP Location: Left Arm, Patient Position: Sitting, Cuff Size: Large)   Pulse 74   Temp 98.2 F (36.8 C)   Resp 20   Ht 5\' 1"  (1.549 m)   Wt 267 lb 9.6 oz (121.4 kg)   LMP 08/10/2010 Comment: Hysterectomy 02/02/18  SpO2 99%   BMI 50.56 kg/m   Wt Readings from Last 3 Encounters:  02/27/20 267 lb 9.6 oz (121.4 kg)  12/26/19 272 lb (123.4 kg)  12/18/19 271 lb 3.2 oz (123 kg)

## 2020-02-27 NOTE — Progress Notes (Signed)
Radiation Oncology         [phone] ________________________________  Name: [name] MRN: [id number]  Date: 02/27/2020  DOB: 08-05-1950  Follow-Up Visit Note  CC: McGowen, Adrian Blackwater, MD  Everitt Amber, MD    ICD-10-CM   1. Endometrial cancer ([place])  C54.1     Diagnosis: Stage IIIA endometroid endometrial carcinoma      Interval Since Last Radiation: One year, nine months, one week, and one day  Radiation treatment dates: 04/24/2018, 05/03/2018, 05/07/2018, 05/16/2018, 05/21/2018  Site/dose:  Vaginal cuff, 6 Gy in 5 fractions for a total dose of 30 Gy  Narrative:  The patient returns today for routine follow-up. She is doing well overall. She last followed up with Dr. Denman George on 11/12/2019. At that time, the patient had reported a single episode of vaginal bleeding that was associated with straining for a bowel movement. Pelvic exam was essentially unremarkable with the exception of white changes in a butterfly distribution that was consistent with lichen sclerosis.   Of note, the patient has been following up with PT for pain in bilateral hips, chronic bilateral low back pain with bilateral sciatica, unsteadiness on feet, and difficulty walking.  She is undergoing physical therapy for this issue.  On review of systems, she reports sore area along her left labia. She denies vaginal bleeding pelvic pain or bleeding from this sore area..                ALLERGIES:  is allergic to adhesive [tape], banana, eggs or egg-derived products, latex, ozempic (0.25 or 0.5 mg-dose) [semaglutide(0.25 or 0.5mg -dos)], penicillins, pine, and rose.  Meds: Current Outpatient Medications  Medication Sig Dispense Refill  . albuterol (PROVENTIL) (2.5 MG/3ML) 0.083% nebulizer solution Take 3 mLs (2.5 mg total) by nebulization every 4 (four) hours as needed for wheezing or shortness of breath (Dx: J45.50). 75 mL 1  . albuterol (VENTOLIN HFA) 108 (90 Base) MCG/ACT inhaler INHALE 2 PUFFS INTO LUNGS  EVERY 6 HOURS AS NEEDED FOR WHEEZING/SHORTNESS OF BREATH 18 each 1  . amitriptyline (ELAVIL) 25 MG tablet TAKE 1 TABLET BY MOUTH EVERYDAY AT BEDTIME 90 tablet 3  . budesonide-formoterol (SYMBICORT) 160-4.5 MCG/ACT inhaler TAKE 2 PUFFS BY MOUTH TWICE A DAY 10.2 Inhaler 11  . clobetasol ointment (TEMOVATE) 4.09 % Apply 1 application topically at bedtime. Apply to the skin of the vulva for 12 weeks 30 g 2  . diclofenac Sodium (VOLTAREN) 1 % GEL Apply 2 g topically 4 (four) times daily. 100 g 3  . felodipine (PLENDIL) 10 MG 24 hr tablet TAKE 1 TABLET BY MOUTH EVERY DAY 90 tablet 0  . fluticasone (CUTIVATE) 0.05 % cream APPLY TO AFFECTED AREA OF R LOWER LEG TWICE PER DAY. 30 g 0  . fluticasone (FLONASE SENSIMIST) 27.5 MCG/SPRAY nasal spray Place 1 spray into the nose daily as needed for rhinitis.    . furosemide (LASIX) 20 MG tablet Take 1 tablet (20 mg total) by mouth daily. 90 tablet 0  . HYDROcodone-acetaminophen (NORCO/VICODIN) 5-325 MG tablet 1-2 tabs po bid prn pain 90 tablet 0  . insulin aspart (NOVOLOG) 100 UNIT/ML injection USE 20-25 units under skin 3x a day before meals 90 mL 3  . insulin NPH Human (NOVOLIN N) 100 UNIT/ML injection Inject 30 units 3x a day under skin 50 mL 3  . Insulin Syringes, Disposable, U-100 0.3 ML MISC Use to inject insulin--6 injections per day 540 each 3  . levocetirizine (XYZAL) 5 MG tablet Take 5 mg by  mouth at bedtime as needed for allergies.   5  . levothyroxine (SYNTHROID) 125 MCG tablet TAKE 1 TABLET BY MOUTH EVERY DAY 90 tablet 2  . metFORMIN (GLUCOPHAGE) 500 MG tablet TAKE 1 TABLET BY MOUTH EVERY MORNING AND 2 TABLETS BY MOUTH AT BEDTIME. OFFICE VISIT NEEDED 270 tablet 0  . miconazole (MICRO GUARD) 2 % powder Apply 1 application topically 2 (two) times daily as needed for itching.    . pravastatin (PRAVACHOL) 40 MG tablet Take 1 tablet (40 mg total) by mouth daily. 90 tablet 1  . Probiotic Product (PROBIOTIC PO) Take 1 capsule by mouth daily.     No current  facility-administered medications for this encounter.    Physical Findings: The patient is in no acute distress. Patient is alert and oriented.  height is 5\' 1"  (1.549 m) and weight is 267 lb 9.6 oz (121.4 kg). Her temperature is 98.2 F (36.8 C). Her blood pressure is 149/58 (abnormal) and her pulse is 74. Her respiration is 20 and oxygen saturation is 99%.  No significant changes. Lungs are clear to auscultation bilaterally. Heart has regular rate and rhythm. No palpable cervical, supraclavicular, or axillary adenopathy. Abdomen soft, non-tender, normal bowel sounds.  Patient is noted to have a erythematous rash consistent with yeast in the skin folds of the lower abdomen.  I recommended she restart her antifungal powder On pelvic examination the external genitalia were unremarkable. No signs of yeast infection in the labia or vaginal area. A speculum exam was performed. There are no mucosal lesions noted in the vaginal vault. On bimanual examination, there were no pelvic masses appreciated. White changes in butterfly distribution consistent with lichen sclerosis along the labia and distal vagina.  Patient is noticed to have a ulcer along the posterior aspect of her left labia majora.  This measures approximately 1 cm in Size.  This is tender with palpation.  No bleeding from this area some area edema surrounding this region.  Body mass index is 50.56 kg/m.  Lab Findings: Lab Results  Component Value Date   WBC 10.1 01/21/2019   HGB 10.3 (L) 01/21/2019   HCT 31.5 (L) 01/21/2019   MCV 96.1 01/21/2019   PLT 223.0 01/21/2019    Radiographic Findings: No results found.  Impression: Stage IIIA endometroid endometrial carcinoma      No evidence of recurrence on clinical exam today.  Patient is noticed to have a ulcer along the posterior aspect of the left labia majora.  She has been given Silvadene to place on this area twice a day  Plan: The patient is scheduled to undergo a colonoscopy on  03/20/2020.  Patient will return in 3 to 4 weeks for follow-up of her labial lesion.  If this is not resolved with antibiotic cream then she will be referred to Dr. Denman George for detailed exam.  Total time spent in this encounter was 25 minutes which included reviewing the patient's most recent follow-up with Dr. Denman George, PT, physical examination, and documentation. ____________________________________   Blair Promise, PhD, MD   This document serves as a record of services personally performed by Gery Pray, MD. It was created on his behalf by Clerance Lav, a trained medical scribe. The creation of this record is based on the scribe's personal observations and the provider's statements to them. This document has been checked and approved by the attending provider.

## 2020-02-28 ENCOUNTER — Ambulatory Visit: Payer: Medicare Other

## 2020-02-28 DIAGNOSIS — M5442 Lumbago with sciatica, left side: Secondary | ICD-10-CM | POA: Diagnosis not present

## 2020-02-28 DIAGNOSIS — R262 Difficulty in walking, not elsewhere classified: Secondary | ICD-10-CM

## 2020-02-28 DIAGNOSIS — R2681 Unsteadiness on feet: Secondary | ICD-10-CM

## 2020-02-28 DIAGNOSIS — M25551 Pain in right hip: Secondary | ICD-10-CM | POA: Diagnosis not present

## 2020-02-28 DIAGNOSIS — M25552 Pain in left hip: Secondary | ICD-10-CM | POA: Diagnosis not present

## 2020-02-28 DIAGNOSIS — M5441 Lumbago with sciatica, right side: Secondary | ICD-10-CM | POA: Diagnosis not present

## 2020-02-28 DIAGNOSIS — G8929 Other chronic pain: Secondary | ICD-10-CM

## 2020-02-28 NOTE — Therapy (Signed)
[facility] [address]  [place], [place], [address] Phone: [phone]   Fax:  [phone]  Physical Therapy Treatment  Patient Details  Name: [name] MRN: [id number] Date of Birth: 05-Nov-1950 Referring Provider (PT): Shawnie Dapper, MD   Encounter Date: 02/28/2020   PT End of Session - 02/28/20 1104    Visit Number 12    Number of Visits 22    Date for PT Re-Evaluation 03/26/20    Authorization Type Medicare & Generic    PT Start Time 1100    PT Stop Time 1143    PT Time Calculation (min) 43 min    Activity Tolerance Patient tolerated treatment well;Patient limited by pain    Behavior During Therapy [facility] for tasks assessed/performed           Past Medical History:  Diagnosis Date  . Allergic rhinitis    Allergy testing: results pending as of 05/28/16 (Dr. Harold Hedge)  . Anemia   . Arthritis   . Chemotherapy-induced neuropathy ([place]) 2019  . Cough variant asthma   . DDD (degenerative disc disease), lumbar   . Diabetes mellitus with complication (HCC)    Mild nonprolif DR R eye 12/2017-->referred to retinal specialist DM MANAGED BY DR. GHERGHE AS OF 2021  . Endometrial cancer ([place]) 02/2018   REMISSION AS OF 08/2018.  Stage III (T3, Nx, Mx)  Path: endometroid adenocarcinoma involving cervix, uterus, and fallopian tubes (Pt got TAH & BSO 02/2018). No sign of recurrence as of onc f/u 05/2019.  Marland Kitchen GERD (gastroesophageal reflux disease)   . Hemorrhoids   . Herpes zoster 10/2015   L side belt-line  . History of adenomatous polyp of colon 02/25/13   5 mm cecal polyp removed by Dr. Trevor Mace 5 yrs  . History of blood transfusion   . History of cellulitis 08/2010   Left (Since replacemnt of Left knee  . Hyperkalemia 03/2018   started after pt started getting chemo-->had to stop enalapril.  Marland Kitchen Hyperlipidemia    Not on statin b/c lipids "stayed down when sugars came down" per pt.  She says Dr. Chalmers Cater knows  she is not on statin anymore.  . Hypertension   . Hypothyroidism   . Nephrolithiasis 4/07  . Obesity   . OSA on CPAP   . Osteoarthritis    knees, ankle, + ? scapholunate ligament disruption (x-ray 06/2017)--ortho referral.  . Pancytopenia due to antineoplastic chemotherapy ([place])    progressive as of 06/2018. Stable 08/2018.  Marland Kitchen Recurrent UTI    started cipro 250 qd 09/2018  . Sciatica of left side   . Severe persistent asthma    cough-variant--saw Allergist 05/25/16 and was switched from max dose advair to symbicort.  . Systolic murmur    ECHO fine 10/2016-->murmur likely flow murmur assoc with HTN.  Marland Kitchen Zoon's vulvitis    Bx-proven (GYN) -lichen sclerosis.  Clobetasol 0.05% ointment per GYN    Past Surgical History:  Procedure Laterality Date  . ANKLE FRACTURE SURGERY  1984   Pin & repair  . ARTHROSCOPIC REPAIR ACL  2000   Due to ACL tear  . ARTHROSCOPIC REPAIR ACL  5/08  . Blateral knee rerlacements x4 2 times each knee    . BREAST BIOPSY Right 2014   Benign  . CARDIAC CATHETERIZATION  5/07   clear vessel mild mitral   . CARPAL TUNNEL RELEASE Right [id number]   1989 Left  . CESAREAN SECTION  1985  .  CHOLECYSTECTOMY OPEN  1988  . COLONOSCOPY N/A 02/25/2013   Tubular adenoma x 1: Recall 5 yrs. Procedure: COLONOSCOPY;  Surgeon: Juanita Craver, MD;  Location: [facility];  Service: Endoscopy;  Laterality: N/A;  . COMBINED HYSTEROSCOPY DIAGNOSTIC / D&C  2/12   Bx neg  . DEXA  08/2007   Bone density normal.  . DILATION AND CURETTAGE OF UTERUS  12/11  . IR IMAGING GUIDED PORT INSERTION  03/16/2018  . IR REMOVAL TUN ACCESS W/ PORT W/O FL MOD SED  09/20/2018  . LYMPH NODE BIOPSY N/A 02/19/2018   Procedure: Sentinel LYMPH NODE BIOPSY;  Surgeon: Everitt Amber, MD;  Location: [facility];  Service: Gynecology;  Laterality: N/A;  . REPLACEMENT TOTAL KNEE Left 5/12   X 2 on each  . ROBOTIC ASSISTED TOTAL HYSTERECTOMY WITH BILATERAL SALPINGO OOPHERECTOMY N/A 02/19/2018   For  endometrial cancer.  Procedure: XI ROBOTIC ASSISTED TOTAL HYSTERECTOMY WITH BILATERAL SALPINGO OOPHORECTOMY;  Surgeon: Everitt Amber, MD;  [facility];  Service: Gynecology;  Laterality: N/A;  . TRANSTHORACIC ECHOCARDIOGRAM  10/11/2016    EF 55-60%, grd I DD.  . TUBAL LIGATION  1985   C-Section    There were no vitals filed for this visit.   Subjective Assessment - 02/28/20 1104    Subjective Pt. reporting she got a good report from the cancer MD yesterday at F/u.    Pertinent History anemia, DDD, DM, L foot fx and pinning, GERD, HLD, HTN, B TKA, OA R foot and wrist, asthma, systolic murmur, ACL repair, L sciatica, hypothyroidism, endometrial CA in remission 08/2018, chemo-induced neuropathy    Diagnostic tests none recent    Patient Stated Goals improve pain    Currently in Pain? Yes    Pain Score 8     Pain Location Back    Pain Orientation Right;Left;Lower    Pain Descriptors / Indicators Aching    Pain Type Chronic pain                             [facility] Adult PT Treatment/Exercise - 02/28/20 0001      Lumbar Exercises: Stretches   Lumbar Stabilization Level 1 2 reps;30 seconds    Lumbar Stabilization Level 1 Limitations seated L-sided lumbar paraspinal stretch with red P-ball rollouts      Knee/Hip Exercises: Stretches   Other Knee/Hip Stretches L modified ER stretch 30" each      Knee/Hip Exercises: Aerobic   Nustep Lvl 4, 6 min (LE/UE)      Knee/Hip Exercises: Seated   Clamshell with [employer]   x 15 seated    Marching Right;Left;AROM;15 reps    Marching Limitations seated in chair with moist heat behind back     Sit to [employer] 1 set;10 reps;without UE support      Moist Heat Therapy   Number Minutes Moist Heat 20 Minutes    Moist Heat Location Lumbar Spine   uiltized lumbar moist heat during seated therex for pain rel     Manual Therapy   Manual Therapy Soft tissue mobilization;Myofascial release    Manual therapy  comments standing leaning over elevated mat table and bolste r    Soft tissue mobilization DTM to L buttocks/piriformis, L-sided lumbar paraspinals     Myofascial Release TPR to L                     PT Short Term Goals -  02/13/20 1455      PT SHORT TERM GOAL #1   Title Pt will be independent with HEP     Time 3    Period Weeks    Status Achieved    Target Date 01/22/20             PT Long Term Goals - 02/13/20 1455      PT LONG TERM GOAL #1   Title Patient to be independent with advanced HEP.    Time 6    Period Weeks    Status Partially Met   met for current   Target Date 03/26/20      PT LONG TERM GOAL #2   Title Patient to demsontrate lumbar AROM in all planes with <5/10 pain.    Time 6    Period Weeks    Status Partially Met   improved in flexion, B sidebending, R rotation   Target Date 03/26/20      PT LONG TERM GOAL #3   Title Patient to score <20 sec on TUG with LRAD to decrease risk of falls.    Time 8    Period Weeks    Status Achieved   01/28/20:  met with SPC     PT LONG TERM GOAL #4   Title Patient to demonstrate B LE strength >/=4+/5.    Time 6    Period Weeks    Status Partially Met   improved in R hip abduction, adduction, and ankle dorsiflexion   Target Date 03/26/20      PT LONG TERM GOAL #5   Title Patient to report tolerance for navigating upstairs to access her bathroom without assistance.    Time 6    Period Weeks    Status On-going   reports that she has not attempted d/t R hand arthritis, pain, and fear of falling   Target Date 03/26/20      PT LONG TERM GOAL #6   Title Patient to score 48/56 on Berg to demonstrate decreased risk of falls.    Time 6    Period Weeks    Status On-going   02/13/20: 40/56   Target Date 03/26/20      PT LONG TERM GOAL #7   Title Patient to score <15 seconds on TUG testing with SPC.    Time 6    Period Weeks    Status New    Target Date 03/26/20                 Plan - 02/28/20  1107    Clinical Impression Statement [name] noting somewhat improved pain since last session however still with high subjective 8/10 pain report in L-sided hip and back.  Manual therapy revealing palpable TP in L-sided lumbar paraspinals and L glute/piriformis with pt. noting some relief after MT and intermittent moist heat application with stretching.  Ended session reporting 5/10 pain while sitting and ambulating with improved upright posture leaving session with SPC.    Comorbidities anemia, DDD, DM, L foot fx and pinning, GERD, HLD, HTN, B TKA, OA R foot and wrist, asthma, systolic murmur, ACL repair, L sciatica, hypothyroidism, endometrial CA in remission 08/2018, chemo-induced neuropathy    Rehab Potential Good    PT Frequency 2x / week    PT Duration 6 weeks    PT Treatment/Interventions ADLs/Self Care Home Management;Cryotherapy;Electrical Stimulation;Moist Heat;Balance training;Therapeutic exercise;Therapeutic activities;Functional mobility training;Stair training;Gait training;DME Instruction;Neuromuscular re-education;Patient/family education;Manual techniques;Taping;Energy conservation;Dry needling;Passive range of motion    PT  Next Visit Plan Progress sitting/standing LE stretching and strengthening to tolerance, stairs    Consulted and Agree with Plan of Care Patient           Patient will benefit from skilled therapeutic intervention in order to improve the following deficits and impairments:  Decreased endurance, Decreased activity tolerance, Decreased strength, Increased fascial restricitons, Pain, Decreased balance, Decreased mobility, Difficulty walking, Increased muscle spasms, Improper body mechanics, Decreased range of motion, Impaired flexibility, Postural dysfunction  Visit Diagnosis: Chronic bilateral low back pain with bilateral sciatica  Pain in left hip  Pain in right hip  Difficulty in walking, not elsewhere classified  Unsteadiness on feet     Problem  List Patient Active Problem List   Diagnosis Date Noted  . Type 2 diabetes, uncontrolled, with retinopathy ([place]) 05/07/2019  . Pancytopenia, acquired ([place]) 04/30/2018  . Bilateral edema of lower extremity 04/09/2018  . Peripheral neuropathy due to chemotherapy ([place]) 04/09/2018  . Endometrial cancer ([place]) 02/19/2018  . Morbid obesity ([place]) 02/19/2018  . Chronic pain 11/03/2017  . Scapholunate ligament injury, no instability 06/29/2017  . HTN (hypertension) 09/27/2016  . Dyspnea 09/27/2016  . Shingles outbreak 10/20/2015  . 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  . Hypothyroidism 07/10/2015  . OSA (obstructive sleep apnea) 07/10/2015  . Endometrial hyperplasia 07/02/2015  . Bronchitis 06/01/2015  . Cough variant asthma 11/02/2014  . Obesity     Bess Harvest, [place] 02/28/20 12:01 PM   [facility] [address]  [place], [place], [address] Phone: [phone]   Fax:  [phone]  Name: [name] MRN: [id number] Date of Birth: 1950/09/14

## 2020-03-03 ENCOUNTER — Other Ambulatory Visit: Payer: Self-pay

## 2020-03-03 ENCOUNTER — Ambulatory Visit: Payer: Medicare Other

## 2020-03-03 DIAGNOSIS — M5441 Lumbago with sciatica, right side: Secondary | ICD-10-CM | POA: Diagnosis not present

## 2020-03-03 DIAGNOSIS — R262 Difficulty in walking, not elsewhere classified: Secondary | ICD-10-CM

## 2020-03-03 DIAGNOSIS — G8929 Other chronic pain: Secondary | ICD-10-CM

## 2020-03-03 DIAGNOSIS — R2681 Unsteadiness on feet: Secondary | ICD-10-CM

## 2020-03-03 DIAGNOSIS — M25551 Pain in right hip: Secondary | ICD-10-CM | POA: Diagnosis not present

## 2020-03-03 DIAGNOSIS — M25552 Pain in left hip: Secondary | ICD-10-CM

## 2020-03-03 DIAGNOSIS — M5442 Lumbago with sciatica, left side: Secondary | ICD-10-CM

## 2020-03-03 NOTE — Therapy (Signed)
[facility] [address]  [place], [place], [address] Phone: [phone]   Fax:  [phone]  Physical Therapy Treatment  Patient Details  Name: [name] MRN: [id number] Date of Birth: [birth year] Referring Provider (PT): Shawnie Dapper, MD   Encounter Date: 03/03/2020   PT End of Session - 03/03/20 1411    Visit Number 13    Number of Visits 22    Date for PT Re-Evaluation 03/26/20    Authorization Type Medicare & Generic    PT Start Time 1406    PT Stop Time 1445    PT Time Calculation (min) 39 min    Activity Tolerance Patient tolerated treatment well;Patient limited by pain    Behavior During Therapy [facility] for tasks assessed/performed           Past Medical History:  Diagnosis Date  . Allergic rhinitis    Allergy testing: results pending as of 05/28/16 (Dr. Harold Hedge)  . Anemia   . Arthritis   . Chemotherapy-induced neuropathy ([place]) 2019  . Cough variant asthma   . DDD (degenerative disc disease), lumbar   . Diabetes mellitus with complication (HCC)    Mild nonprolif DR R eye 12/2017-->referred to retinal specialist DM MANAGED BY DR. GHERGHE AS OF 2021  . Endometrial cancer ([place]) 02/2018   REMISSION AS OF 08/2018.  Stage III (T3, Nx, Mx)  Path: endometroid adenocarcinoma involving cervix, uterus, and fallopian tubes (Pt got TAH & BSO 02/2018). No sign of recurrence as of onc f/u 05/2019.  Marland Kitchen GERD (gastroesophageal reflux disease)   . Hemorrhoids   . Herpes zoster 10/2015   L side belt-line  . History of adenomatous polyp of colon 02/25/13   5 mm cecal polyp removed by Dr. Trevor Mace 5 yrs  . History of blood transfusion   . History of cellulitis 08/2010   Left (Since replacemnt of Left knee  . Hyperkalemia 03/2018   started after pt started getting chemo-->had to stop enalapril.  Marland Kitchen Hyperlipidemia    Not on statin b/c lipids "stayed down when sugars came down" per pt.  She says Dr. Chalmers Cater knows  she is not on statin anymore.  . Hypertension   . Hypothyroidism   . Nephrolithiasis 4/07  . Obesity   . OSA on CPAP   . Osteoarthritis    knees, ankle, + ? scapholunate ligament disruption (x-ray 06/2017)--ortho referral.  . Pancytopenia due to antineoplastic chemotherapy ([place])    progressive as of 06/2018. Stable 08/2018.  Marland Kitchen Recurrent UTI    started cipro 250 qd 09/2018  . Sciatica of left side   . Severe persistent asthma    cough-variant--saw Allergist 05/25/16 and was switched from max dose advair to symbicort.  . Systolic murmur    ECHO fine 10/2016-->murmur likely flow murmur assoc with HTN.  Marland Kitchen Zoon's vulvitis    Bx-proven (GYN) -lichen sclerosis.  Clobetasol 0.05% ointment per GYN    Past Surgical History:  Procedure Laterality Date  . ANKLE FRACTURE SURGERY  1984   Pin & repair  . ARTHROSCOPIC REPAIR ACL  2000   Due to ACL tear  . ARTHROSCOPIC REPAIR ACL  5/08  . Blateral knee rerlacements x4 2 times each knee    . BREAST BIOPSY Right 2014   Benign  . CARDIAC CATHETERIZATION  5/07   clear vessel mild mitral   . CARPAL TUNNEL RELEASE Right 1324;4010   1989 Left  . CESAREAN SECTION  1985  .  CHOLECYSTECTOMY OPEN  1988  . COLONOSCOPY N/A 02/25/2013   Tubular adenoma x 1: Recall 5 yrs. Procedure: COLONOSCOPY;  Surgeon: Juanita Craver, MD;  Location[facility];  Service: Endoscopy;  Laterality: N/A;  . COMBINED HYSTEROSCOPY DIAGNOSTIC / D&C  2/12   Bx neg  . DEXA  08/2007   Bone density normal.  . DILATION AND CURETTAGE OF UTERUS  12/11  . IR IMAGING GUIDED PORT INSERTION  03/16/2018  . IR REMOVAL TUN ACCESS W/ PORT W/O FL MOD SED  09/20/2018  . LYMPH NODE BIOPSY N/A 02/19/2018   Procedure: Sentinel LYMPH NODE BIOPSY;  Surgeon: Everitt Amber, MD;  Location: [facility];  Service: Gynecology;  Laterality: N/A;  . REPLACEMENT TOTAL KNEE Left 5/12   X 2 on each  . ROBOTIC ASSISTED TOTAL HYSTERECTOMY WITH BILATERAL SALPINGO OOPHERECTOMY N/A 02/19/2018   For  endometrial cancer.  Procedure: XI ROBOTIC ASSISTED TOTAL HYSTERECTOMY WITH BILATERAL SALPINGO OOPHORECTOMY;  Surgeon: Everitt Amber, MD;  Location: [facility];  Service: Gynecology;  Laterality: N/A;  . TRANSTHORACIC ECHOCARDIOGRAM  10/11/2016    EF 55-60%, grd I DD.  . TUBAL LIGATION  1985   C-Section    There were no vitals filed for this visit.   Subjective Assessment - 03/03/20 1409    Subjective Pt. doing ok.    Pertinent History anemia, DDD, DM, L foot fx and pinning, GERD, HLD, HTN, B TKA, OA R foot and wrist, asthma, systolic murmur, ACL repair, L sciatica, hypothyroidism, endometrial CA in remission 08/2018, chemo-induced neuropathy    Diagnostic tests none recent    Patient Stated Goals improve pain    Currently in Pain? Yes    Pain Score 8     Pain Location Back    Pain Orientation Right;Left;Lower    Pain Descriptors / Indicators Aching    Pain Type Chronic pain                             [facility] Adult PT Treatment/Exercise - 03/03/20 0001      Knee/Hip Exercises: Standing   Hip Flexion Right;Left;10 reps;Knee bent    Hip Flexion Limitations standing at elevated mat table     Hip Abduction Right;Left;10 reps;Knee straight;Stengthening    Abduction Limitations standing at elevated mat table for support     Forward Step Up Right;Left;5 reps;Step Height: 6";Hand Hold: 2    Forward Step Up Limitations seated rest break between sets with moist heat on lumbar spine in chair       Knee/Hip Exercises: Seated   Marching Right;Left;AROM;15 reps    Marching Limitations seated in chair with moist heat behind back       Moist Heat Therapy   Number Minutes Moist Heat 20 Minutes    Moist Heat Location Lumbar Spine   utilized during seated rest breaks and therex to lumbar spin                 PT Education - 03/03/20 1620    Education Details HEP update; hip abduction    Person(s) Educated Patient    Methods  Explanation;Demonstration;Verbal cues;Handout    Comprehension Returned demonstration;Verbalized understanding;Verbal cues required            PT Short Term Goals - 02/13/20 1455      PT SHORT TERM GOAL #1   Title Pt will be independent with HEP     Time 3    Period  Weeks    Status Achieved    Target Date 01/22/20             PT Long Term Goals - 02/13/20 1455      PT LONG TERM GOAL #1   Title Patient to be independent with advanced HEP.    Time 6    Period Weeks    Status Partially Met   met for current   Target Date 03/26/20      PT LONG TERM GOAL #2   Title Patient to demsontrate lumbar AROM in all planes with <5/10 pain.    Time 6    Period Weeks    Status Partially Met   improved in flexion, B sidebending, R rotation   Target Date 03/26/20      PT LONG TERM GOAL #3   Title Patient to score <20 sec on TUG with LRAD to decrease risk of falls.    Time 8    Period Weeks    Status Achieved   01/28/20:  met with SPC     PT LONG TERM GOAL #4   Title Patient to demonstrate B LE strength >/=4+/5.    Time 6    Period Weeks    Status Partially Met   improved in R hip abduction, adduction, and ankle dorsiflexion   Target Date 03/26/20      PT LONG TERM GOAL #5   Title Patient to report tolerance for navigating upstairs to access her bathroom without assistance.    Time 6    Period Weeks    Status On-going   reports that she has not attempted d/t R hand arthritis, pain, and fear of falling   Target Date 03/26/20      PT LONG TERM GOAL #6   Title Patient to score 48/56 on Berg to demonstrate decreased risk of falls.    Time 6    Period Weeks    Status On-going   02/13/20: 40/56   Target Date 03/26/20      PT LONG TERM GOAL #7   Title Patient to score <15 seconds on TUG testing with SPC.    Time 6    Period Weeks    Status New    Target Date 03/26/20                 Plan - 03/03/20 1421    Clinical Impression Statement Pt. noting good pain relief  from LBP with use of moist heat throughout treatment session last visit.  Because of this, utilized moist heat to lumbar spine during seated therex and rest breaks throughout session which seemed to improve pt. tolerance for standing therex.  Session time somewhat limited by pt. needing to use the restroom and having to take breaks during bouts of walking with SPC due to LBP.  Was able to progress repetitions with 6" step ups and progress hip abduction strengthening.  Pt. leaving session noting no increased in LBP from baseline.    Comorbidities anemia, DDD, DM, L foot fx and pinning, GERD, HLD, HTN, B TKA, OA R foot and wrist, asthma, systolic murmur, ACL repair, L sciatica, hypothyroidism, endometrial CA in remission 08/2018, chemo-induced neuropathy    Rehab Potential Good    PT Frequency 2x / week    PT Duration 6 weeks    PT Treatment/Interventions ADLs/Self Care Home Management;Cryotherapy;Electrical Stimulation;Moist Heat;Balance training;Therapeutic exercise;Therapeutic activities;Functional mobility training;Stair training;Gait training;DME Instruction;Neuromuscular re-education;Patient/family education;Manual techniques;Taping;Energy conservation;Dry needling;Passive range of motion    PT Next  Visit Plan Progress sitting/standing LE stretching and strengthening to tolerance, stairs    Consulted and Agree with Plan of Care Patient           Patient will benefit from skilled therapeutic intervention in order to improve the following deficits and impairments:  Decreased endurance, Decreased activity tolerance, Decreased strength, Increased fascial restricitons, Pain, Decreased balance, Decreased mobility, Difficulty walking, Increased muscle spasms, Improper body mechanics, Decreased range of motion, Impaired flexibility, Postural dysfunction  Visit Diagnosis: Chronic bilateral low back pain with bilateral sciatica  Pain in left hip  Pain in right hip  Difficulty in walking, not  elsewhere classified  Unsteadiness on feet     Problem List Patient Active Problem List   Diagnosis Date Noted  . Type 2 diabetes, uncontrolled, with retinopathy ([place]) 05/07/2019  . Pancytopenia, acquired ([place]) 04/30/2018  . Bilateral edema of lower extremity 04/09/2018  . Peripheral neuropathy due to chemotherapy ([place]) 04/09/2018  . Endometrial cancer ([place]) 02/19/2018  . Morbid obesity ([place]) 02/19/2018  . Chronic pain 11/03/2017  . Scapholunate ligament injury, no instability 06/29/2017  . HTN (hypertension) 09/27/2016  . Dyspnea 09/27/2016  . Shingles outbreak 10/20/2015  . 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  . Hypothyroidism 07/10/2015  . OSA (obstructive sleep apnea) 07/10/2015  . Endometrial hyperplasia 07/02/2015  . Bronchitis 06/01/2015  . Cough variant asthma 11/02/2014  . Obesity     Bess Harvest, [place] 03/03/20 4:24 PM   [facility] [address]  [place], [place], [address] Phone: [phone]   Fax:  [phone]  Name: [name] MRN: [id number] Date of Birth: [birth year]

## 2020-03-10 ENCOUNTER — Ambulatory Visit: Payer: Medicare Other

## 2020-03-10 ENCOUNTER — Other Ambulatory Visit: Payer: Self-pay

## 2020-03-10 DIAGNOSIS — M25552 Pain in left hip: Secondary | ICD-10-CM | POA: Diagnosis not present

## 2020-03-10 DIAGNOSIS — M5442 Lumbago with sciatica, left side: Secondary | ICD-10-CM | POA: Diagnosis not present

## 2020-03-10 DIAGNOSIS — M25551 Pain in right hip: Secondary | ICD-10-CM | POA: Diagnosis not present

## 2020-03-10 DIAGNOSIS — M5441 Lumbago with sciatica, right side: Secondary | ICD-10-CM | POA: Diagnosis not present

## 2020-03-10 DIAGNOSIS — G8929 Other chronic pain: Secondary | ICD-10-CM

## 2020-03-10 DIAGNOSIS — R262 Difficulty in walking, not elsewhere classified: Secondary | ICD-10-CM

## 2020-03-10 DIAGNOSIS — R2681 Unsteadiness on feet: Secondary | ICD-10-CM

## 2020-03-10 NOTE — Therapy (Signed)
[facility] [address]  [place], [place], [address] Phone: [phone]   Fax:  [phone]  Physical Therapy Treatment  Patient Details  Name: [name] MRN: [id number] Date of Birth: 1950/05/13 Referring Provider (PT): Shawnie Dapper, MD   Encounter Date: 03/10/2020   PT End of Session - 03/10/20 1409    Visit Number 14    Number of Visits 22    Date for PT Re-Evaluation 03/26/20    Authorization Type Medicare & Generic    PT Start Time 1402    PT Stop Time 1440    PT Time Calculation (min) 38 min    Activity Tolerance Patient tolerated treatment well;Patient limited by pain    Behavior During Therapy [facility] for tasks assessed/performed           Past Medical History:  Diagnosis Date  . Allergic rhinitis    Allergy testing: results pending as of 05/28/16 (Dr. Harold Hedge)  . Anemia   . Arthritis   . Chemotherapy-induced neuropathy ([place]) 2019  . Cough variant asthma   . DDD (degenerative disc disease), lumbar   . Diabetes mellitus with complication (HCC)    Mild nonprolif DR R eye 12/2017-->referred to retinal specialist DM MANAGED BY DR. GHERGHE AS OF 2021  . Endometrial cancer ([place]) 02/2018   REMISSION AS OF 08/2018.  Stage III (T3, Nx, Mx)  Path: endometroid adenocarcinoma involving cervix, uterus, and fallopian tubes (Pt got TAH & BSO 02/2018). No sign of recurrence as of onc f/u 05/2019.  Marland Kitchen GERD (gastroesophageal reflux disease)   . Hemorrhoids   . Herpes zoster 10/2015   L side belt-line  . History of adenomatous polyp of colon 02/25/13   5 mm cecal polyp removed by Dr. Trevor Mace 5 yrs  . History of blood transfusion   . History of cellulitis 08/2010   Left (Since replacemnt of Left knee  . Hyperkalemia 03/2018   started after pt started getting chemo-->had to stop enalapril.  Marland Kitchen Hyperlipidemia    Not on statin b/c lipids "stayed down when sugars came down" per pt.  She says Dr. Chalmers Cater knows  she is not on statin anymore.  . Hypertension   . Hypothyroidism   . Nephrolithiasis 4/07  . Obesity   . OSA on CPAP   . Osteoarthritis    knees, ankle, + ? scapholunate ligament disruption (x-ray 06/2017)--ortho referral.  . Pancytopenia due to antineoplastic chemotherapy ([place])    progressive as of 06/2018. Stable 08/2018.  Marland Kitchen Recurrent UTI    started cipro 250 qd 09/2018  . Sciatica of left side   . Severe persistent asthma    cough-variant--saw Allergist 05/25/16 and was switched from max dose advair to symbicort.  . Systolic murmur    ECHO fine 10/2016-->murmur likely flow murmur assoc with HTN.  Marland Kitchen Zoon's vulvitis    Bx-proven (GYN) -lichen sclerosis.  Clobetasol 0.05% ointment per GYN    Past Surgical History:  Procedure Laterality Date  . ANKLE FRACTURE SURGERY  1984   Pin & repair  . ARTHROSCOPIC REPAIR ACL  2000   Due to ACL tear  . ARTHROSCOPIC REPAIR ACL  5/08  . Blateral knee rerlacements x4 2 times each knee    . BREAST BIOPSY Right 2014   Benign  . CARDIAC CATHETERIZATION  5/07   clear vessel mild mitral   . CARPAL TUNNEL RELEASE Right [id number]   1989 Left  . CESAREAN SECTION  1985  .  CHOLECYSTECTOMY OPEN  1988  . COLONOSCOPY N/A 02/25/2013   Tubular adenoma x 1: Recall 5 yrs. Procedure: COLONOSCOPY;  Surgeon: Jyothi Mann, MD;  Location: [facility];  Service: Endoscopy;  Laterality: N/A;  . COMBINED HYSTEROSCOPY DIAGNOSTIC / D&C  2/12   Bx neg  . DEXA  08/2007   Bone density normal.  . DILATION AND CURETTAGE OF UTERUS  12/11  . IR IMAGING GUIDED PORT INSERTION  03/16/2018  . IR REMOVAL TUN ACCESS W/ PORT W/O FL MOD SED  09/20/2018  . LYMPH NODE BIOPSY N/A 02/19/2018   Procedure: Sentinel LYMPH NODE BIOPSY;  Surgeon: Rossi, Emma, MD;  Location: [facility];  Service: Gynecology;  Laterality: N/A;  . REPLACEMENT TOTAL KNEE Left 5/12   X 2 on each  . ROBOTIC ASSISTED TOTAL HYSTERECTOMY WITH BILATERAL SALPINGO OOPHERECTOMY N/A 02/19/2018   For  endometrial cancer.  Procedure: XI ROBOTIC ASSISTED TOTAL HYSTERECTOMY WITH BILATERAL SALPINGO OOPHORECTOMY;  Surgeon: Rossi, Emma, MD;  Location: [facility];  Service: Gynecology;  Laterality: N/A;  . TRANSTHORACIC ECHOCARDIOGRAM  10/11/2016    EF 55-60%, grd I DD.  . TUBAL LIGATION  1985   C-Section    There were no vitals filed for this visit.   Subjective Assessment - 03/10/20 1406    Subjective Pt. noting increased "tingling" in L foot since last visit.    Pertinent History anemia, DDD, DM, L foot fx and pinning, GERD, HLD, HTN, B TKA, OA R foot and wrist, asthma, systolic murmur, ACL repair, L sciatica, hypothyroidism, endometrial CA in remission 08/2018, chemo-induced neuropathy    Diagnostic tests none recent    Patient Stated Goals improve pain    Currently in Pain? Yes    Pain Score 9     Pain Location Back    Pain Orientation Right;Left;Lower    Pain Descriptors / Indicators Aching    Pain Type Chronic pain    Pain Radiating Towards pain radiating into B hips and L LE    Pain Onset More than a month ago              [facility] PT Assessment - 03/10/20 0001      Timed Up and Go Test   Normal TUG (seconds) 14.09   with SPC                        [facility] Adult PT Treatment/Exercise - 03/10/20 0001      Knee/Hip Exercises: Aerobic   Nustep Lvl 4, 6 min (LE/UE)      Knee/Hip Exercises: Standing   Heel Raises Both;1 set;15 reps    Heel Raises Limitations at counter     Hip Flexion Right;Left;10 reps;Knee bent    Hip Flexion Limitations standing at elevated mat table     Functional Squat 1 set;10 reps    Functional Squat Limitations to chair + airex pad       Knee/Hip Exercises: Seated   Clamshell with TheraBand Green   x 15 reps    Marching Right;Left;AROM;10 reps    Marching Limitations seated in chair with moist heat behind back     Sit to Sand 1 set;10 reps;without UE support                    PT Short Term Goals -  02/13/20 1455      PT SHORT TERM GOAL #1   Title Pt will be independent with HEP       Time 3    Period Weeks    Status Achieved    Target Date 01/22/20             PT Long Term Goals - 03/10/20 1418      PT LONG TERM GOAL #1   Title Patient to be independent with advanced HEP.    Time 6    Period Weeks    Status Partially Met   met for current     PT LONG TERM GOAL #2   Title Patient to demsontrate lumbar AROM in all planes with <5/10 pain.    Time 6    Period Weeks    Status Partially Met   improved in flexion, B sidebending, R rotation     PT LONG TERM GOAL #3   Title Patient to score <20 sec on TUG with LRAD to decrease risk of falls.    Time 8    Period Weeks    Status Achieved   01/28/20:  met with SPC     PT LONG TERM GOAL #4   Title Patient to demonstrate B LE strength >/=4+/5.    Time 6    Period Weeks    Status Partially Met   improved in R hip abduction, adduction, and ankle dorsiflexion     PT LONG TERM GOAL #5   Title Patient to report tolerance for navigating upstairs to access her bathroom without assistance.    Time 6    Period Weeks    Status On-going   reports that she has not attempted d/t R hand arthritis, pain, and fear of falling     PT LONG TERM GOAL #6   Title Patient to score 48/56 on Berg to demonstrate decreased risk of falls.    Time 6    Period Weeks    Status On-going   02/13/20: 40/56     PT LONG TERM GOAL #7   Title Patient to score <15 seconds on TUG testing with SPC.    Time 6    Period Weeks    Status Achieved   03/10/20                Plan - 03/10/20 1438    Clinical Impression Statement [name] with high initial subjective pain report of 9/10 LBP which extends into B hips and L foot with increased LE "tingling" recently.  Progressed standing LE clearance therex today with good overall tolerance and report of decreased LBP by end of session.  Pt. able to demo normal TUG time of <15 sec thus LTG #7 met.     Comorbidities anemia, DDD, DM, L foot fx and pinning, GERD, HLD, HTN, B TKA, OA R foot and wrist, asthma, systolic murmur, ACL repair, L sciatica, hypothyroidism, endometrial CA in remission 08/2018, chemo-induced neuropathy    Rehab Potential Good    PT Frequency 2x / week    PT Duration 6 weeks    PT Treatment/Interventions ADLs/Self Care Home Management;Cryotherapy;Electrical Stimulation;Moist Heat;Balance training;Therapeutic exercise;Therapeutic activities;Functional mobility training;Stair training;Gait training;DME Instruction;Neuromuscular re-education;Patient/family education;Manual techniques;Taping;Energy conservation;Dry needling;Passive range of motion    PT Next Visit Plan Progress sitting/standing LE stretching and strengthening to tolerance, stairs    Consulted and Agree with Plan of Care Patient           Patient will benefit from skilled therapeutic intervention in order to improve the following deficits and impairments:  Decreased endurance, Decreased activity tolerance, Decreased strength, Increased fascial restricitons, Pain, Decreased balance, Decreased mobility, Difficulty walking,  Increased muscle spasms, Improper body mechanics, Decreased range of motion, Impaired flexibility, Postural dysfunction  Visit Diagnosis: Chronic bilateral low back pain with bilateral sciatica  Pain in left hip  Pain in right hip  Difficulty in walking, not elsewhere classified  Unsteadiness on feet     Problem List Patient Active Problem List   Diagnosis Date Noted  . Type 2 diabetes, uncontrolled, with retinopathy ([place]) 05/07/2019  . Pancytopenia, acquired ([place]) 04/30/2018  . Bilateral edema of lower extremity 04/09/2018  . Peripheral neuropathy due to chemotherapy ([place]) 04/09/2018  . Endometrial cancer ([place]) 02/19/2018  . Morbid obesity ([place]) 02/19/2018  . Chronic pain 11/03/2017  . Scapholunate ligament injury, no instability 06/29/2017  . HTN (hypertension) 09/27/2016  .  Dyspnea 09/27/2016  . Shingles outbreak 10/20/2015  . 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  . Hypothyroidism 07/10/2015  . OSA (obstructive sleep apnea) 07/10/2015  . Endometrial hyperplasia 07/02/2015  . Bronchitis 06/01/2015  . Cough variant asthma 11/02/2014  . Obesity     Bess Harvest, [place] 03/10/20 5:48 PM   [facility] [address]  [place], [place], [address] Phone: [phone]   Fax:  [phone]  Name: BERNARD DONAHOO MRN: [id number] Date of Birth: [birth year]

## 2020-03-11 NOTE — Progress Notes (Signed)
Ms. Macquarrie present today for follow-up after completing vaginal HDR brachytherapy on 05/21/2018  Pain:7/10 Fatigue:fatigue is constant Appetite:fair Filed Weights   03/12/20 1038  Weight: 296 lb 6 oz (134.4 kg)    Urinary:no issues Bowel:diarrhea Vaginal bleeding?:no but in to have open area checked Using dilators?: yes GYN follow-up: noOther issues of note:  *insert vitals*BP (!) 152/59 (BP Location: Left Arm, Patient Position: Sitting)   Pulse 79   Temp (!) 97.4 F (36.3 C) (Temporal)   Resp 18   Ht 5\' 1"  (1.549 m)   Wt 296 lb 6 oz (134.4 kg)   LMP 08/10/2010 Comment: Hysterectomy 02/02/18  SpO2 96%   BMI 56.00 kg/m   Patient was here 2 weeks ago found to have an ulceration on perineal area has been using medication. States it feels less deep and is not as sore.

## 2020-03-12 ENCOUNTER — Other Ambulatory Visit: Payer: Self-pay

## 2020-03-12 ENCOUNTER — Ambulatory Visit
Admission: RE | Admit: 2020-03-12 | Discharge: 2020-03-12 | Disposition: A | Discharge status: Home / Self Care | Payer: Medicare Other | Source: Ambulatory Visit | Attending: Radiation Oncology | Admitting: Radiation Oncology

## 2020-03-12 ENCOUNTER — Encounter: Payer: Self-pay | Admitting: Radiation Oncology

## 2020-03-12 ENCOUNTER — Ambulatory Visit: Payer: Medicare Other

## 2020-03-12 VITALS — BP 152/59 | HR 79 | Temp 97.4°F | Resp 18 | Ht 61.0 in | Wt 296.4 lb

## 2020-03-12 DIAGNOSIS — Z8542 Personal history of malignant neoplasm of other parts of uterus: Secondary | ICD-10-CM | POA: Insufficient documentation

## 2020-03-12 DIAGNOSIS — Z79899 Other long term (current) drug therapy: Secondary | ICD-10-CM | POA: Diagnosis not present

## 2020-03-12 DIAGNOSIS — Z923 Personal history of irradiation: Secondary | ICD-10-CM | POA: Insufficient documentation

## 2020-03-12 DIAGNOSIS — C541 Malignant neoplasm of endometrium: Secondary | ICD-10-CM

## 2020-03-12 NOTE — Progress Notes (Signed)
Radiation Oncology         [phone] ________________________________  Name: [name] MRN: [id number]  Date: 03/12/2020  DOB: [birth year]  Follow-Up Visit Note  CC: McGowen, Adrian Blackwater, MD  Everitt Amber, MD    ICD-10-CM   1. Endometrial cancer ([place])  C54.1     Diagnosis: Stage IIIA endometroid endometrial carcinoma      Interval Since Last Radiation: One year, nine months, and two weeks  Radiation treatment dates: 04/24/2018, 05/03/2018, 05/07/2018, 05/16/2018, 05/21/2018  Site/dose:  Vaginal cuff, 6 Gy in 5 fractions for a total dose of 30 Gy  Narrative:  The patient returns today for follow-up of labial lesion. Since her last visit, she has continued going to PT for pain in bilateral hips, chronic bilateral low back pain with bilateral sciatica, unsteadiness on feet, and difficulty walking.  On review of systems, she reports less discomfort in the labia area.  She has been placing Silvadene on this area 3 times a day. She denies drainage or bleeding from the area.  She reports no new medical issues..           ALLERGIES:  is allergic to adhesive [tape], banana, eggs or egg-derived products, latex, ozempic (0.25 or 0.5 mg-dose) [semaglutide(0.25 or 0.5mg -dos)], penicillins, pine, and rose.  Meds: Current Outpatient Medications  Medication Sig Dispense Refill  . albuterol (PROVENTIL) (2.5 MG/3ML) 0.083% nebulizer solution Take 3 mLs (2.5 mg total) by nebulization every 4 (four) hours as needed for wheezing or shortness of breath (Dx: J45.50). 75 mL 1  . albuterol (VENTOLIN HFA) 108 (90 Base) MCG/ACT inhaler INHALE 2 PUFFS INTO LUNGS EVERY 6 HOURS AS NEEDED FOR WHEEZING/SHORTNESS OF BREATH 18 each 1  . amitriptyline (ELAVIL) 25 MG tablet TAKE 1 TABLET BY MOUTH EVERYDAY AT BEDTIME 90 tablet 3  . budesonide-formoterol (SYMBICORT) 160-4.5 MCG/ACT inhaler TAKE 2 PUFFS BY MOUTH TWICE A DAY 10.2 Inhaler 11  . clobetasol ointment (TEMOVATE) 9.79 % Apply 1 application topically at  bedtime. Apply to the skin of the vulva for 12 weeks 30 g 2  . diclofenac Sodium (VOLTAREN) 1 % GEL Apply 2 g topically 4 (four) times daily. 100 g 3  . felodipine (PLENDIL) 10 MG 24 hr tablet TAKE 1 TABLET BY MOUTH EVERY DAY 90 tablet 0  . fluticasone (CUTIVATE) 0.05 % cream APPLY TO AFFECTED AREA OF R LOWER LEG TWICE PER DAY. 30 g 0  . fluticasone (FLONASE SENSIMIST) 27.5 MCG/SPRAY nasal spray Place 1 spray into the nose daily as needed for rhinitis.    . furosemide (LASIX) 20 MG tablet Take 1 tablet (20 mg total) by mouth daily. 90 tablet 0  . HYDROcodone-acetaminophen (NORCO/VICODIN) 5-325 MG tablet 1-2 tabs po bid prn pain 90 tablet 0  . insulin aspart (NOVOLOG) 100 UNIT/ML injection USE 20-25 units under skin 3x a day before meals 90 mL 3  . insulin NPH Human (NOVOLIN N) 100 UNIT/ML injection Inject 30 units 3x a day under skin 50 mL 3  . Insulin Syringes, Disposable, U-100 0.3 ML MISC Use to inject insulin--6 injections per day 540 each 3  . levocetirizine (XYZAL) 5 MG tablet Take 5 mg by mouth at bedtime as needed for allergies.   5  . levothyroxine (SYNTHROID) 125 MCG tablet TAKE 1 TABLET BY MOUTH EVERY DAY 90 tablet 2  . metFORMIN (GLUCOPHAGE) 500 MG tablet TAKE 1 TABLET BY MOUTH EVERY MORNING AND 2 TABLETS BY MOUTH AT BEDTIME. OFFICE VISIT NEEDED 270 tablet 0  . miconazole (  MICRO GUARD) 2 % powder Apply 1 application topically 2 (two) times daily as needed for itching.    . pravastatin (PRAVACHOL) 40 MG tablet Take 1 tablet (40 mg total) by mouth daily. 90 tablet 1  . Probiotic Product (PROBIOTIC PO) Take 1 capsule by mouth daily.     No current facility-administered medications for this encounter.    Physical Findings: The patient is in no acute distress. Patient is alert and oriented.  height is 5\' 1"  (1.549 m) and weight is 296 lb 6 oz (134.4 kg). Her temporal temperature is 97.4 F (36.3 C) (abnormal). Her blood pressure is 152/59 (abnormal) and her pulse is 79. Her respiration  is 18 and oxygen saturation is 96%.   The abdominal exam shows less erythema along the skin folds.  She has been using antifungal powder in this area.  The ulcer along the left posterior labia majora is decreased somewhat in size not as erythematous and does not feel quite as deep.  This area is nontender with palpation at this time.  Body mass index is 56 kg/m.  Lab Findings: Lab Results  Component Value Date   WBC 10.1 01/21/2019   HGB 10.3 (L) 01/21/2019   HCT 31.5 (L) 01/21/2019   MCV 96.1 01/21/2019   PLT 223.0 01/21/2019    Radiographic Findings: No results found.  Impression: Stage IIIA endometroid endometrial carcinoma      I recommend the patient continue using Silvadene to the area of concern until it heals.  She will call if this area does not completely heal.  Plan: The patient is scheduled to undergo a colonoscopy on 03/20/2020. She will follow up with radiation oncology in 6  Months.  She will see Dr. Denman George in 3 months.  Next spring she will be able to extend her appointment out to 6 months.  Total time spent in this encounter was 15 minutes which included reviewing the patient's most recent PT, physical examination, and documentation.  ____________________________________   Blair Promise, PhD, MD   This document serves as a record of services personally performed by Gery Pray, MD. It was created on his behalf by Clerance Lav, a trained medical scribe. The creation of this record is based on the scribe's personal observations and the provider's statements to them. This document has been checked and approved by the attending provider.

## 2020-03-16 ENCOUNTER — Other Ambulatory Visit: Payer: Self-pay | Admitting: Family Medicine

## 2020-03-17 ENCOUNTER — Encounter: Payer: Medicare Other | Admitting: Physical Therapy

## 2020-03-19 ENCOUNTER — Ambulatory Visit: Payer: Medicare Other | Attending: Family Medicine | Admitting: Physical Therapy

## 2020-03-19 ENCOUNTER — Encounter: Payer: Self-pay | Admitting: Physical Therapy

## 2020-03-19 ENCOUNTER — Other Ambulatory Visit: Payer: Self-pay

## 2020-03-19 DIAGNOSIS — M5441 Lumbago with sciatica, right side: Secondary | ICD-10-CM | POA: Diagnosis not present

## 2020-03-19 DIAGNOSIS — M5442 Lumbago with sciatica, left side: Secondary | ICD-10-CM | POA: Diagnosis not present

## 2020-03-19 DIAGNOSIS — R262 Difficulty in walking, not elsewhere classified: Secondary | ICD-10-CM | POA: Insufficient documentation

## 2020-03-19 DIAGNOSIS — M25551 Pain in right hip: Secondary | ICD-10-CM | POA: Insufficient documentation

## 2020-03-19 DIAGNOSIS — G8929 Other chronic pain: Secondary | ICD-10-CM | POA: Insufficient documentation

## 2020-03-19 DIAGNOSIS — R2681 Unsteadiness on feet: Secondary | ICD-10-CM | POA: Diagnosis not present

## 2020-03-19 DIAGNOSIS — M25552 Pain in left hip: Secondary | ICD-10-CM | POA: Insufficient documentation

## 2020-03-19 NOTE — Therapy (Signed)
[facility] [address]  [place], [place], [address] Phone: [phone]   Fax:  [phone]  Physical Therapy Treatment  Patient Details  Name: [name] MRN: [id number] Date of Birth: [birth year] Referring Provider (PT): Shawnie Dapper, MD   Encounter Date: 03/19/2020   PT End of Session - 03/19/20 1538    Visit Number 15    Number of Visits 22    Date for PT Re-Evaluation 03/26/20    Authorization Type Medicare & Generic    PT Start Time 1403    PT Stop Time 1445    PT Time Calculation (min) 42 min    Activity Tolerance Patient tolerated treatment well;Patient limited by pain    Behavior During Therapy [facility] for tasks assessed/performed           Past Medical History:  Diagnosis Date  . Allergic rhinitis    Allergy testing: results pending as of 05/28/16 (Dr. Harold Hedge)  . Anemia   . Arthritis   . Chemotherapy-induced neuropathy ([place]) 2019  . Cough variant asthma   . DDD (degenerative disc disease), lumbar   . Diabetes mellitus with complication (HCC)    Mild nonprolif DR R eye 12/2017-->referred to retinal specialist DM MANAGED BY DR. GHERGHE AS OF 2021  . Endometrial cancer ([place]) 02/2018   REMISSION AS OF 08/2018.  Stage III (T3, Nx, Mx)  Path: endometroid adenocarcinoma involving cervix, uterus, and fallopian tubes (Pt got TAH & BSO 02/2018). No sign of recurrence as of onc f/u 05/2019.  Marland Kitchen GERD (gastroesophageal reflux disease)   . Hemorrhoids   . Herpes zoster 10/2015   L side belt-line  . History of adenomatous polyp of colon 02/25/13   5 mm cecal polyp removed by Dr. Trevor Mace 5 yrs  . History of blood transfusion   . History of cellulitis 08/2010   Left (Since replacemnt of Left knee  . Hyperkalemia 03/2018   started after pt started getting chemo-->had to stop enalapril.  Marland Kitchen Hyperlipidemia    Not on statin b/c lipids "stayed down when sugars came down" per pt.  She says Dr. Chalmers Cater knows she  is not on statin anymore.  . Hypertension   . Hypothyroidism   . Nephrolithiasis 4/07  . Obesity   . OSA on CPAP   . Osteoarthritis    knees, ankle, + ? scapholunate ligament disruption (x-ray 06/2017)--ortho referral.  . Pancytopenia due to antineoplastic chemotherapy ([place])    progressive as of 06/2018. Stable 08/2018.  Marland Kitchen Recurrent UTI    started cipro 250 qd 09/2018  . Sciatica of left side   . Severe persistent asthma    cough-variant--saw Allergist 05/25/16 and was switched from max dose advair to symbicort.  . Systolic murmur    ECHO fine 10/2016-->murmur likely flow murmur assoc with HTN.  Marland Kitchen Zoon's vulvitis    Bx-proven (GYN) -lichen sclerosis.  Clobetasol 0.05% ointment per GYN    Past Surgical History:  Procedure Laterality Date  . ANKLE FRACTURE SURGERY  1984   Pin & repair  . ARTHROSCOPIC REPAIR ACL  2000   Due to ACL tear  . ARTHROSCOPIC REPAIR ACL  5/08  . Blateral knee rerlacements x4 2 times each knee    . BREAST BIOPSY Right 2014   Benign  . CARDIAC CATHETERIZATION  5/07   clear vessel mild mitral   . CARPAL TUNNEL RELEASE Right [id number]   1989 Left  . CESAREAN SECTION  1985  .  CHOLECYSTECTOMY OPEN  1988  . COLONOSCOPY N/A 02/25/2013   Tubular adenoma x 1: Recall 5 yrs. Procedure: COLONOSCOPY;  Surgeon: Juanita Craver, MD;  Location: [facility];  Service: Endoscopy;  Laterality: N/A;  . COMBINED HYSTEROSCOPY DIAGNOSTIC / D&C  2/12   Bx neg  . DEXA  08/2007   Bone density normal.  . DILATION AND CURETTAGE OF UTERUS  12/11  . IR IMAGING GUIDED PORT INSERTION  03/16/2018  . IR REMOVAL TUN ACCESS W/ PORT W/O FL MOD SED  09/20/2018  . LYMPH NODE BIOPSY N/A 02/19/2018   Procedure: Sentinel LYMPH NODE BIOPSY;  Surgeon: Everitt Amber, MD;  Location: [facility];  Service: Gynecology;  Laterality: N/A;  . REPLACEMENT TOTAL KNEE Left 5/12   X 2 on each  . ROBOTIC ASSISTED TOTAL HYSTERECTOMY WITH BILATERAL SALPINGO OOPHERECTOMY N/A 02/19/2018   For  endometrial cancer.  Procedure: XI ROBOTIC ASSISTED TOTAL HYSTERECTOMY WITH BILATERAL SALPINGO OOPHORECTOMY;  Surgeon: Everitt Amber, MD;  Location: [facility];  Service: Gynecology;  Laterality: N/A;  . TRANSTHORACIC ECHOCARDIOGRAM  10/11/2016    EF 55-60%, grd I DD.  . TUBAL LIGATION  1985   C-Section    There were no vitals filed for this visit.   Subjective Assessment - 03/19/20 1405    Subjective Noting pain in the LB and L elbow today without known cause. Got sick over the weekend d/t a new medication and admits to not performing her HEP as a result.    Patient is accompained by: Family member   husband   Pertinent History anemia, DDD, DM, L foot fx and pinning, GERD, HLD, HTN, B TKA, OA R foot and wrist, asthma, systolic murmur, ACL repair, L sciatica, hypothyroidism, endometrial CA in remission 08/2018, chemo-induced neuropathy    Diagnostic tests none recent    Patient Stated Goals improve pain    Pain Score 8    8.5   Pain Location Back    Pain Orientation Right;Left;Lower    Pain Descriptors / Indicators Aching    Pain Type Chronic pain    Pain Score 3   noncontinuous   Pain Location Elbow    Pain Orientation Left    Pain Descriptors / Indicators Stabbing    Pain Type Acute pain                             [facility] Adult PT Treatment/Exercise - 03/19/20 0001      Lumbar Exercises: Stretches   Other Lumbar Stretch Exercise sitting trunk rotation with slight OP holding onto armrest 3x each side; 3x each with dowel and gentle OP from therapist      Lumbar Exercises: Standing   Row Strengthening;Both;10 reps;Theraband    Theraband Level (Row) Level 3 (Green)    Row Limitations 2x10    Other Standing Lumbar Exercises R/L paloff press with green TB 10x each      Knee/Hip Exercises: Aerobic   Nustep Lvl 4, 6 min (LEs)      Knee/Hip Exercises: Seated   Other Seated Knee/Hip Exercises R/L ankle dorsiflexion with green TB x10 each    Hamstring  Curl Strengthening;Right;Left;2 sets;15 reps    Hamstring Limitations green TB       Manual Therapy   Manual Therapy Soft tissue mobilization;Myofascial release    Manual therapy comments R sidelying    Soft tissue mobilization STM/DTM to L buttocks    Myofascial Release manual TPR to  L proximal glutes and piriformis                    PT Short Term Goals - 02/13/20 1455      PT SHORT TERM GOAL #1   Title Pt will be independent with HEP     Time 3    Period Weeks    Status Achieved    Target Date 01/22/20             PT Long Term Goals - 03/10/20 1418      PT LONG TERM GOAL #1   Title Patient to be independent with advanced HEP.    Time 6    Period Weeks    Status Partially Met   met for current     PT LONG TERM GOAL #2   Title Patient to demsontrate lumbar AROM in all planes with <5/10 pain.    Time 6    Period Weeks    Status Partially Met   improved in flexion, B sidebending, R rotation     PT LONG TERM GOAL #3   Title Patient to score <20 sec on TUG with LRAD to decrease risk of falls.    Time 8    Period Weeks    Status Achieved   01/28/20:  met with SPC     PT LONG TERM GOAL #4   Title Patient to demonstrate B LE strength >/=4+/5.    Time 6    Period Weeks    Status Partially Met   improved in R hip abduction, adduction, and ankle dorsiflexion     PT LONG TERM GOAL #5   Title Patient to report tolerance for navigating upstairs to access her bathroom without assistance.    Time 6    Period Weeks    Status On-going   reports that she has not attempted d/t R hand arthritis, pain, and fear of falling     PT LONG TERM GOAL #6   Title Patient to score 48/56 on Berg to demonstrate decreased risk of falls.    Time 6    Period Weeks    Status On-going   02/13/20: 40/56     PT LONG TERM GOAL #7   Title Patient to score <15 seconds on TUG testing with SPC.    Time 6    Period Weeks    Status Achieved   03/10/20                Plan -  03/19/20 1538    Clinical Impression Statement Patient reporting LBP and new onset of L elbow pain without known cause. Also notes that she became sick over the weekend d/t a new medication and admits to not performing her HEP as a result. Worked on gentle lumbopelvic stretching and sitting LE strengthening with patient reporting good tolerance. Proceeded with standing core strengthening, with patient demonstrating good form but requiring intermittent sitting rest breaks d/t pain and fatigue. Patient did report onset of L sided LB tightness after several standing exercises, thus this was addressed with STM and TPR. Patient still with several palpable trigger points throughout. Advised patient to continue using self-STM and stretching at home. Patient reported understanding. Noted mild "wooziness" upon sitting which dissipated after brief sitting break. Patient without further complaints at end of session.    Comorbidities anemia, DDD, DM, L foot fx and pinning, GERD, HLD, HTN, B TKA, OA R foot and wrist, asthma, systolic murmur, ACL repair, L sciatica,  hypothyroidism, endometrial CA in remission 08/2018, chemo-induced neuropathy    Rehab Potential Good    PT Frequency 2x / week    PT Duration 6 weeks    PT Treatment/Interventions ADLs/Self Care Home Management;Cryotherapy;Electrical Stimulation;Moist Heat;Balance training;Therapeutic exercise;Therapeutic activities;Functional mobility training;Stair training;Gait training;DME Instruction;Neuromuscular re-education;Patient/family education;Manual techniques;Taping;Energy conservation;Dry needling;Passive range of motion    PT Next Visit Plan Progress sitting/standing LE stretching and strengthening to tolerance, stairs    Consulted and Agree with Plan of Care Patient           Patient will benefit from skilled therapeutic intervention in order to improve the following deficits and impairments:  Decreased endurance,Decreased activity tolerance,Decreased  strength,Increased fascial restricitons,Pain,Decreased balance,Decreased mobility,Difficulty walking,Increased muscle spasms,Improper body mechanics,Decreased range of motion,Impaired flexibility,Postural dysfunction  Visit Diagnosis: Chronic bilateral low back pain with bilateral sciatica  Pain in left hip  Pain in right hip  Difficulty in walking, not elsewhere classified  Unsteadiness on feet     Problem List Patient Active Problem List   Diagnosis Date Noted  . Type 2 diabetes, uncontrolled, with retinopathy ([place]) 05/07/2019  . Pancytopenia, acquired ([place]) 04/30/2018  . Bilateral edema of lower extremity 04/09/2018  . Peripheral neuropathy due to chemotherapy ([place]) 04/09/2018  . Endometrial cancer ([place]) 02/19/2018  . Morbid obesity ([place]) 02/19/2018  . Chronic pain 11/03/2017  . Scapholunate ligament injury, no instability 06/29/2017  . HTN (hypertension) 09/27/2016  . Dyspnea 09/27/2016  . Shingles outbreak 10/20/2015  . 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  . Hypothyroidism 07/10/2015  . OSA (obstructive sleep apnea) 07/10/2015  . Endometrial hyperplasia 07/02/2015  . Bronchitis 06/01/2015  . Cough variant asthma 11/02/2014  . Obesity     Janene Harvey, PT, DPT 03/19/20 3:40 PM   [facility] [address]  [place], [place], [address] Phone: [phone]   Fax:  [phone]  Name: [name] MRN: [id number] Date of Birth: 10-08-1950

## 2020-03-20 ENCOUNTER — Encounter (HOSPITAL_COMMUNITY): Admission: RE | Payer: Self-pay | Source: Home / Self Care

## 2020-03-20 ENCOUNTER — Ambulatory Visit (HOSPITAL_COMMUNITY): Admission: RE | Admit: 2020-03-20 | Payer: Medicare Other | Source: Home / Self Care | Admitting: Gastroenterology

## 2020-03-20 SURGERY — COLONOSCOPY WITH PROPOFOL
Anesthesia: Monitor Anesthesia Care

## 2020-03-24 ENCOUNTER — Other Ambulatory Visit: Payer: Self-pay

## 2020-03-24 ENCOUNTER — Ambulatory Visit: Payer: Medicare Other

## 2020-03-24 DIAGNOSIS — M25552 Pain in left hip: Secondary | ICD-10-CM

## 2020-03-24 DIAGNOSIS — R2681 Unsteadiness on feet: Secondary | ICD-10-CM

## 2020-03-24 DIAGNOSIS — M5442 Lumbago with sciatica, left side: Secondary | ICD-10-CM | POA: Diagnosis not present

## 2020-03-24 DIAGNOSIS — R262 Difficulty in walking, not elsewhere classified: Secondary | ICD-10-CM | POA: Diagnosis not present

## 2020-03-24 DIAGNOSIS — M25551 Pain in right hip: Secondary | ICD-10-CM | POA: Diagnosis not present

## 2020-03-24 DIAGNOSIS — M5441 Lumbago with sciatica, right side: Secondary | ICD-10-CM | POA: Diagnosis not present

## 2020-03-24 DIAGNOSIS — G8929 Other chronic pain: Secondary | ICD-10-CM | POA: Diagnosis not present

## 2020-03-24 NOTE — Therapy (Signed)
[facility] [address]  [place], [place], [address] Phone: [phone]   Fax:  [phone]  Physical Therapy Treatment  Patient Details  Name: [name] MRN: [id number] Date of Birth: May 08, 1950 Referring Provider (PT): Shawnie Dapper, MD   Encounter Date: 03/24/2020   PT End of Session - 03/24/20 1423    Visit Number 16    Number of Visits 22    Date for PT Re-Evaluation 03/26/20    Authorization Type Medicare & Generic    PT Start Time 1407    PT Stop Time 1510    PT Time Calculation (min) 63 min    Activity Tolerance Patient tolerated treatment well;Patient limited by pain    Behavior During Therapy [facility] for tasks assessed/performed           Past Medical History:  Diagnosis Date  . Allergic rhinitis    Allergy testing: results pending as of 05/28/16 (Dr. Harold Hedge)  . Anemia   . Arthritis   . Chemotherapy-induced neuropathy ([place]) 2019  . Cough variant asthma   . DDD (degenerative disc disease), lumbar   . Diabetes mellitus with complication (HCC)    Mild nonprolif DR R eye 12/2017-->referred to retinal specialist DM MANAGED BY DR. GHERGHE AS OF 2021  . Endometrial cancer ([place]) 02/2018   REMISSION AS OF 08/2018.  Stage III (T3, Nx, Mx)  Path: endometroid adenocarcinoma involving cervix, uterus, and fallopian tubes (Pt got TAH & BSO 02/2018). No sign of recurrence as of onc f/u 05/2019.  Marland Kitchen GERD (gastroesophageal reflux disease)   . Hemorrhoids   . Herpes zoster 10/2015   L side belt-line  . History of adenomatous polyp of colon 02/25/13   5 mm cecal polyp removed by Dr. Trevor Mace 5 yrs  . History of blood transfusion   . History of cellulitis 08/2010   Left (Since replacemnt of Left knee  . Hyperkalemia 03/2018   started after pt started getting chemo-->had to stop enalapril.  Marland Kitchen Hyperlipidemia    Not on statin b/c lipids "stayed down when sugars came down" per pt.  She says Dr. Chalmers Cater knows  she is not on statin anymore.  . Hypertension   . Hypothyroidism   . Nephrolithiasis 4/07  . Obesity   . OSA on CPAP   . Osteoarthritis    knees, ankle, + ? scapholunate ligament disruption (x-ray 06/2017)--ortho referral.  . Pancytopenia due to antineoplastic chemotherapy ([place])    progressive as of 06/2018. Stable 08/2018.  Marland Kitchen Recurrent UTI    started cipro 250 qd 09/2018  . Sciatica of left side   . Severe persistent asthma    cough-variant--saw Allergist 05/25/16 and was switched from max dose advair to symbicort.  . Systolic murmur    ECHO fine 10/2016-->murmur likely flow murmur assoc with HTN.  Marland Kitchen Zoon's vulvitis    Bx-proven (GYN) -lichen sclerosis.  Clobetasol 0.05% ointment per GYN    Past Surgical History:  Procedure Laterality Date  . ANKLE FRACTURE SURGERY  1984   Pin & repair  . ARTHROSCOPIC REPAIR ACL  2000   Due to ACL tear  . ARTHROSCOPIC REPAIR ACL  5/08  . Blateral knee rerlacements x4 2 times each knee    . BREAST BIOPSY Right 2014   Benign  . CARDIAC CATHETERIZATION  5/07   clear vessel mild mitral   . CARPAL TUNNEL RELEASE Right [id number]   1989 Left  . CESAREAN SECTION  1985  .  CHOLECYSTECTOMY OPEN  1988  . COLONOSCOPY N/A 02/25/2013   Tubular adenoma x 1: Recall 5 yrs. Procedure: COLONOSCOPY;  Surgeon: Juanita Craver, MD;  [facility];  Service: Endoscopy;  Laterality: N/A;  . COMBINED HYSTEROSCOPY DIAGNOSTIC / D&C  2/12   Bx neg  . DEXA  08/2007   Bone density normal.  . DILATION AND CURETTAGE OF UTERUS  12/11  . IR IMAGING GUIDED PORT INSERTION  03/16/2018  . IR REMOVAL TUN ACCESS W/ PORT W/O FL MOD SED  09/20/2018  . LYMPH NODE BIOPSY N/A 02/19/2018   Procedure: Sentinel LYMPH NODE BIOPSY;  Surgeon: Everitt Amber, MD;  Location[facility];  Service: Gynecology;  Laterality: N/A;  . REPLACEMENT TOTAL KNEE Left 5/12   X 2 on each  . ROBOTIC ASSISTED TOTAL HYSTERECTOMY WITH BILATERAL SALPINGO OOPHERECTOMY N/A 02/19/2018   For  endometrial cancer.  Procedure: XI ROBOTIC ASSISTED TOTAL HYSTERECTOMY WITH BILATERAL SALPINGO OOPHORECTOMY;  Surgeon: Everitt Amber, MD;  Location: [facility];  Service: Gynecology;  Laterality: N/A;  . TRANSTHORACIC ECHOCARDIOGRAM  10/11/2016    EF 55-60%, grd I DD.  . TUBAL LIGATION  1985   C-Section    There were no vitals filed for this visit.   Subjective Assessment - 03/24/20 1414    Subjective Pt. noting no signficant change in her pain level since starting therapy.  Had a partial fall where she was able to catch herself partially with UE and down on one knee over weekend on Saturday.    Pertinent History anemia, DDD, DM, L foot fx and pinning, GERD, HLD, HTN, B TKA, OA R foot and wrist, asthma, systolic murmur, ACL repair, L sciatica, hypothyroidism, endometrial CA in remission 08/2018, chemo-induced neuropathy    Diagnostic tests none recent    Patient Stated Goals improve pain    Currently in Pain? Yes    Pain Score 8     Pain Location Back    Pain Orientation Right;Lower    Pain Descriptors / Indicators Aching    Pain Type Chronic pain    Pain Radiating Towards pain radiating into R and L hip (L>R) and into L LE somewhat    Pain Onset More than a month ago    Aggravating Factors  stretching forward too far or turning a direction she does not normally go    Pain Relieving Factors using heat, and utilizing "pressure point" ball on wall for massage    Multiple Pain Sites No    Pain Score 3    Pain Location Elbow    Pain Orientation Left    Pain Descriptors / Indicators Sharp    Pain Type Acute pain    Aggravating Factors  notices increased L posterior elbow pain when holding phone at bent 90 dg position    Pain Relieving Factors change in position                             [facility] Adult PT Treatment/Exercise - 03/24/20 0001      Self-Care   Self-Care Other Self-Care Comments    Other Self-Care Comments  Discussion of comprehensive HEP to  check for need for advancement; added resistance to standing march and progressed to functional squat with HEP update issued via handout to pt.; discussion of pt. current progress and her wishes to continue with therapy vs going on 30-day hold      Lumbar Exercises: Stretches   Passive  Hamstring Stretch Right;Left;2 reps;30 seconds    Passive Hamstring Stretch Limitations with heel prop on bolster    Piriformis Stretch Right;Left;2 reps;30 seconds    Piriformis Stretch Limitations modifed piri position sitting on table    Gastroc Stretch Right;Left;1 rep;30 seconds    Gastroc Stretch Limitations Leaning into counter      Knee/Hip Exercises: Aerobic   Nustep Lvl 4, 6 min (LEs)      Knee/Hip Exercises: Standing   Hip Flexion Right;Left;10 reps;Knee bent    Hip Flexion Limitations counter and cane support    Hip Abduction Right;Left;10 reps;Knee straight;Stengthening    Abduction Limitations at counter      Knee/Hip Exercises: Seated   Sit to Sand 1 set;10 reps;without UE support      Moist Heat Therapy   Number Minutes Moist Heat 10 Minutes    Moist Heat Location --   lumbar spine utilized during therex breaks     Manual Therapy   Manual Therapy Soft tissue mobilization;Myofascial release    Manual therapy comments standing leaning over bolster on elevated table    Soft tissue mobilization STM/DTM to L buttocks    Myofascial Release manual TPR to L proximal glutes and piriformis                  PT Education - 03/24/20 1522    Education Details HEP update    Person(s) Educated Patient    Methods Explanation;Demonstration;Verbal cues    Comprehension Verbalized understanding;Returned demonstration;Verbal cues required            PT Short Term Goals - 02/13/20 1455      PT SHORT TERM GOAL #1   Title Pt will be independent with HEP     Time 3    Period Weeks    Status Achieved    Target Date 01/22/20             PT Long Term Goals - 03/10/20 1418      PT  LONG TERM GOAL #1   Title Patient to be independent with advanced HEP.    Time 6    Period Weeks    Status Partially Met   met for current     PT LONG TERM GOAL #2   Title Patient to demsontrate lumbar AROM in all planes with <5/10 pain.    Time 6    Period Weeks    Status Partially Met   improved in flexion, B sidebending, R rotation     PT LONG TERM GOAL #3   Title Patient to score <20 sec on TUG with LRAD to decrease risk of falls.    Time 8    Period Weeks    Status Achieved   01/28/20:  met with SPC     PT LONG TERM GOAL #4   Title Patient to demonstrate B LE strength >/=4+/5.    Time 6    Period Weeks    Status Partially Met   improved in R hip abduction, adduction, and ankle dorsiflexion     PT LONG TERM GOAL #5   Title Patient to report tolerance for navigating upstairs to access her bathroom without assistance.    Time 6    Period Weeks    Status On-going   reports that she has not attempted d/t R hand arthritis, pain, and fear of falling     PT LONG TERM GOAL #6   Title Patient to score 48/56 on Berg to demonstrate decreased risk  of falls.    Time 6    Period Weeks    Status On-going   02/13/20: 40/56     PT LONG TERM GOAL #7   Title Patient to score <15 seconds on TUG testing with SPC.    Time 6    Period Weeks    Status Achieved   03/10/20                Plan - 03/24/20 1426    Clinical Impression Statement [name] reporting some improvement in walking distance tolerance within her home however notes no improvement in her LBP intensity since starting with therapy which limits her walking and stair climbing tolerance.  Progressed LE strengthening activities per pt. tolerance while utilizing moist heat for relief from LBP/hip pain during sitting rest breaks.  Discussed with pt. her current goal status (progress toward goals) with pt. leaning toward 30-day hold from therapy as she approaches last day in her POC.  Reviewed full HEP and advanced a few of the  standing strengthening exercises with pt. verbalizing understanding of new HEP handout.  Will plan for final assessment and final HEP review per pt. in coming session.    Comorbidities anemia, DDD, DM, L foot fx and pinning, GERD, HLD, HTN, B TKA, OA R foot and wrist, asthma, systolic murmur, ACL repair, L sciatica, hypothyroidism, endometrial CA in remission 08/2018, chemo-induced neuropathy    Rehab Potential Good    PT Frequency 2x / week    PT Duration 6 weeks    PT Treatment/Interventions ADLs/Self Care Home Management;Cryotherapy;Electrical Stimulation;Moist Heat;Balance training;Therapeutic exercise;Therapeutic activities;Functional mobility training;Stair training;Gait training;DME Instruction;Neuromuscular re-education;Patient/family education;Manual techniques;Taping;Energy conservation;Dry needling;Passive range of motion    PT Next Visit Plan Progress sitting/standing LE stretching and strengthening to tolerance, stairs    Consulted and Agree with Plan of Care Patient           Patient will benefit from skilled therapeutic intervention in order to improve the following deficits and impairments:  Decreased endurance,Decreased activity tolerance,Decreased strength,Increased fascial restricitons,Pain,Decreased balance,Decreased mobility,Difficulty walking,Increased muscle spasms,Improper body mechanics,Decreased range of motion,Impaired flexibility,Postural dysfunction  Visit Diagnosis: Chronic bilateral low back pain with bilateral sciatica  Pain in left hip  Pain in right hip  Difficulty in walking, not elsewhere classified  Unsteadiness on feet     Problem List Patient Active Problem List   Diagnosis Date Noted  . Type 2 diabetes, uncontrolled, with retinopathy ([place]) 05/07/2019  . Pancytopenia, acquired ([place]) 04/30/2018  . Bilateral edema of lower extremity 04/09/2018  . Peripheral neuropathy due to chemotherapy ([place]) 04/09/2018  . Endometrial cancer ([place]) 02/19/2018   . Morbid obesity ([place]) 02/19/2018  . Chronic pain 11/03/2017  . Scapholunate ligament injury, no instability 06/29/2017  . HTN (hypertension) 09/27/2016  . Dyspnea 09/27/2016  . Shingles outbreak 10/20/2015  . 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  . Hypothyroidism 07/10/2015  . OSA (obstructive sleep apnea) 07/10/2015  . Endometrial hyperplasia 07/02/2015  . Bronchitis 06/01/2015  . Cough variant asthma 11/02/2014  . Obesity      Bess Harvest, [place] 03/24/20 3:26 PM   [facility] [address]  [place], [place], [address] Phone: [phone]   Fax:  [phone]  Name: [name] MRN: [id number] Date of Birth: May 25, 1950

## 2020-03-26 ENCOUNTER — Encounter: Payer: Self-pay | Admitting: Physical Therapy

## 2020-03-26 ENCOUNTER — Other Ambulatory Visit: Payer: Self-pay

## 2020-03-26 ENCOUNTER — Telehealth: Payer: Self-pay | Admitting: Family Medicine

## 2020-03-26 ENCOUNTER — Ambulatory Visit: Payer: Medicare Other | Admitting: Physical Therapy

## 2020-03-26 DIAGNOSIS — M25552 Pain in left hip: Secondary | ICD-10-CM

## 2020-03-26 DIAGNOSIS — M5442 Lumbago with sciatica, left side: Secondary | ICD-10-CM

## 2020-03-26 DIAGNOSIS — R2681 Unsteadiness on feet: Secondary | ICD-10-CM

## 2020-03-26 DIAGNOSIS — M25551 Pain in right hip: Secondary | ICD-10-CM

## 2020-03-26 DIAGNOSIS — G8929 Other chronic pain: Secondary | ICD-10-CM | POA: Diagnosis not present

## 2020-03-26 DIAGNOSIS — R262 Difficulty in walking, not elsewhere classified: Secondary | ICD-10-CM | POA: Diagnosis not present

## 2020-03-26 DIAGNOSIS — M5441 Lumbago with sciatica, right side: Secondary | ICD-10-CM | POA: Diagnosis not present

## 2020-03-26 NOTE — Telephone Encounter (Signed)
Spoke with patient she would like for me to call back next week

## 2020-03-26 NOTE — Therapy (Addendum)
[facility] [address]  [place], [place], [address] Phone: [phone]   Fax:  [phone]  Physical Therapy Treatment  Patient Details  Name: [name] MRN: [id number] Date of Birth: 1950-07-03 Referring Provider (PT): Shawnie Dapper, MD   Progress Note Reporting Period 02/25/20 to 03/26/20  See note below for Objective Data and Assessment of Progress/Goals.     Encounter Date: 03/26/2020   PT End of Session - 03/26/20 1643    Visit Number 17    Number of Visits 22    Date for PT Re-Evaluation 03/26/20    Authorization Type Medicare & Generic    PT Start Time 1417   pt late   PT Stop Time 1450    PT Time Calculation (min) 33 min    Equipment Utilized During Treatment Gait belt    Activity Tolerance Patient tolerated treatment well;Patient limited by pain    Behavior During Therapy WFL for tasks assessed/performed           Past Medical History:  Diagnosis Date  . Allergic rhinitis    Allergy testing: results pending as of 05/28/16 (Dr. Harold Hedge)  . Anemia   . Arthritis   . Chemotherapy-induced neuropathy ([facility]) 2019  . Cough variant asthma   . DDD (degenerative disc disease), lumbar   . Diabetes mellitus with complication (HCC)    Mild nonprolif DR R eye 12/2017-->referred to retinal specialist DM MANAGED BY DR. GHERGHE AS OF 2021  . Endometrial cancer ([facility]) 02/2018   REMISSION AS OF 08/2018.  Stage III (T3, Nx, Mx)  Path: endometroid adenocarcinoma involving cervix, uterus, and fallopian tubes (Pt got TAH & BSO 02/2018). No sign of recurrence as of onc f/u 05/2019.  Marland Kitchen GERD (gastroesophageal reflux disease)   . Hemorrhoids   . Herpes zoster 10/2015   L side belt-line  . History of adenomatous polyp of colon 02/25/13   5 mm cecal polyp removed by Dr. Trevor Mace 5 yrs  . History of blood transfusion   . History of cellulitis 08/2010   Left (Since replacemnt of Left knee  . Hyperkalemia  03/2018   started after pt started getting chemo-->had to stop enalapril.  Marland Kitchen Hyperlipidemia    Not on statin b/c lipids "stayed down when sugars came down" per pt.  She says Dr. Chalmers Cater knows she is not on statin anymore.  . Hypertension   . Hypothyroidism   . Nephrolithiasis 4/07  . Obesity   . OSA on CPAP   . Osteoarthritis    knees, ankle, + ? scapholunate ligament disruption (x-ray 06/2017)--ortho referral.  . Pancytopenia due to antineoplastic chemotherapy ([facility])    progressive as of 06/2018. Stable 08/2018.  Marland Kitchen Recurrent UTI    started cipro 250 qd 09/2018  . Sciatica of left side   . Severe persistent asthma    cough-variant--saw Allergist 05/25/16 and was switched from max dose advair to symbicort.  . Systolic murmur    ECHO fine 10/2016-->murmur likely flow murmur assoc with HTN.  Marland Kitchen Zoon's vulvitis    Bx-proven (GYN) -lichen sclerosis.  Clobetasol 0.05% ointment per GYN    Past Surgical History:  Procedure Laterality Date  . ANKLE FRACTURE SURGERY  1984   Pin & repair  . ARTHROSCOPIC REPAIR ACL  2000   Due to ACL tear  . ARTHROSCOPIC REPAIR ACL  5/08  . Blateral knee rerlacements x4 2 times each knee    . BREAST BIOPSY Right 2014  Benign  . CARDIAC CATHETERIZATION  5/07   clear vessel mild mitral   . CARPAL TUNNEL RELEASE Right [id number]   1989 Left  . CESAREAN SECTION  1985  . CHOLECYSTECTOMY OPEN  1988  . COLONOSCOPY N/A 02/25/2013   Tubular adenoma x 1: Recall 5 yrs. Procedure: COLONOSCOPY;  Surgeon: Juanita Craver, MD;  Location: [facility];  Service: Endoscopy;  Laterality: N/A;  . COMBINED HYSTEROSCOPY DIAGNOSTIC / D&C  2/12   Bx neg  . DEXA  08/2007   Bone density normal.  . DILATION AND CURETTAGE OF UTERUS  12/11  . IR IMAGING GUIDED PORT INSERTION  03/16/2018  . IR REMOVAL TUN ACCESS W/ PORT W/O FL MOD SED  09/20/2018  . LYMPH NODE BIOPSY N/A 02/19/2018   Procedure: Sentinel LYMPH NODE BIOPSY;  Surgeon: Everitt Amber, MD;  Location: [facility];   Service: Gynecology;  Laterality: N/A;  . REPLACEMENT TOTAL KNEE Left 5/12   X 2 on each  . ROBOTIC ASSISTED TOTAL HYSTERECTOMY WITH BILATERAL SALPINGO OOPHERECTOMY N/A 02/19/2018   For endometrial cancer.  Procedure: XI ROBOTIC ASSISTED TOTAL HYSTERECTOMY WITH BILATERAL SALPINGO OOPHORECTOMY;  Surgeon: Everitt Amber, MD;  Location: [facility];  Service: Gynecology;  Laterality: N/A;  . TRANSTHORACIC ECHOCARDIOGRAM  10/11/2016    EF 55-60%, grd I DD.  . TUBAL LIGATION  1985   C-Section    There were no vitals filed for this visit.   Subjective Assessment - 03/26/20 1419    Subjective Reports that she is late d/t finding out her brother is going into hospice. Feeling like she is ready to wrap up with thera py today. Feels like the pain in her R hip has improved since starting therapy, however still deals with pain on the L. Reports 75% improvement in the R hip thus far, 45% in L hip.    Patient is accompained by: Family member   daughter   Pertinent History anemia, DDD, DM, L foot fx and pinning, GERD, HLD, HTN, B TKA, OA R foot and wrist, asthma, systolic murmur, ACL repair, L sciatica, hypothyroidism, endometrial CA in remission 08/2018, chemo-induced neuropathy    Diagnostic tests none recent    Patient Stated Goals improve pain    Currently in Pain? Yes    Pain Score 9     Pain Location Back    Pain Orientation Left;Lower    Pain Descriptors / Indicators Aching    Pain Type Chronic pain              [facility] PT Assessment - 03/26/20 0001      Assessment   Medical Diagnosis Chronic bilateral LBP with B sciatica, lumbat spondylosis, lumbar DDD    Referring Provider (PT) Shawnie Dapper, MD    Onset Date/Surgical Date --   chronic for several years     AROM   Lumbar Flexion nearly to toes   smal twinge of pain   Lumbar Extension mildly limited   discomfort   Lumbar - Right Side Bend distal thigh   pain   Lumbar - Left Side Bend jt line   pain   Lumbar - Right  Rotation moderately limited   pain   Lumbar - Left Rotation mildly limited   pain     Strength   Right Knee Flexion 4+/5    Right Knee Extension 4+/5    Left Knee Flexion 4+/5    Left Knee Extension 4+/5    Right Ankle Dorsiflexion 4+/5  Right Ankle Plantar Flexion 4/5    Left Ankle Dorsiflexion 4+/5    Left Ankle Plantar Flexion 4+/5      Berg Balance Test   Sit to Stand Able to stand without using hands and stabilize independently    Standing Unsupported Able to stand safely 2 minutes    Sitting with Back Unsupported but Feet Supported on Floor or Stool Able to sit safely and securely 2 minutes    Stand to Sit Sits safely with minimal use of hands    Transfers Able to transfer safely, minor use of hands    Standing Unsupported with Eyes Closed Able to stand 10 seconds safely    Standing Unsupported with Feet Together Able to place feet together independently and stand 1 minute safely    From Standing, Reach Forward with Outstretched Arm Can reach forward >12 cm safely (5")    From Standing Position, Pick up Object from Floor Unable to pick up and needs supervision    From Standing Position, Turn to Look Behind Over each Shoulder Looks behind from both sides and weight shifts well    Turn 360 Degrees Able to turn 360 degrees safely but slowly    Standing Unsupported, Alternately Place Feet on Step/Stool Able to complete >2 steps/needs minimal assist    Standing Unsupported, One Foot in [employer] balance while stepping or standing    Standing on One Leg Unable to try or needs assist to prevent fall    Total Score 39                         [facility] Adult PT Treatment/Exercise - 03/26/20 0001      Moist Heat Therapy   Number Minutes Moist Heat 5 Minutes    Moist Heat Location Lumbar Spine                  PT Education - 03/26/20 1641    Education Details discussion on objective progress and remaining impairments; encouraged patient to return to therapy  to avoid continued decline in balance    Person(s) Educated Patient    Methods Explanation    Comprehension Verbalized understanding            PT Short Term Goals - 02/13/20 1455      PT SHORT TERM GOAL #1   Title Pt will be independent with HEP     Time 3    Period Weeks    Status Achieved    Target Date 01/22/20             PT Long Term Goals - 03/26/20 1653      PT LONG TERM GOAL #1   Title Patient to be independent with advanced HEP.    Time 6    Period Weeks    Status Achieved   met for current     PT LONG TERM GOAL #2   Title Patient to demsontrate lumbar AROM in all planes with <5/10 pain.    Time 6    Period Weeks    Status Partially Met   improved in flexion, extension, L rotation; pain persists     PT LONG TERM GOAL #3   Title Patient to score <20 sec on TUG with LRAD to decrease risk of falls.    Time 8    Period Weeks    Status Achieved   01/28/20:  met with SPC     PT LONG TERM GOAL #  4   Title Patient to demonstrate B LE strength >/=4+/5.    Time 6    Period Weeks    Status Partially Met   L PF limiting     PT LONG TERM GOAL #5   Title Patient to report tolerance for navigating upstairs to access her bathroom without assistance.    Time 6    Period Weeks    Status Not Met   notes that she is not able     PT LONG TERM GOAL #6   Title Patient to score 48/56 on Berg to demonstrate decreased risk of falls.    Time 6    Period Weeks    Status Not Met   03/26/20: 39/56     PT LONG TERM GOAL #7   Title Patient to score <15 seconds on TUG testing with SPC.    Time 6    Period Weeks    Status Achieved   03/10/20                Plan - 03/26/20 1702    Clinical Impression Statement Patient reports feeling like she is ready to wrap up with therapy today. Reports 75% improvement in the R hip thus far, 45% improvement in the L hip. Patient demonstrated good improvement in LE strength, now only with L PF strength limiting. Lumbar AROM has  improved in flexion, extension, and L rotation, however patient still reporting pain at end-ranges. Notes that she is still unable to navigate the stairs in her home, but has a first floor set up where she does not need to access upstairs. Patient has shown steady decline in balance as evidenced by score on Berg. While respecting patient's decision to wrap up with therapy today, did encourage her to return to therapy to avoid continued decline in balance. Updated HEP with exercises that were well-tolerated today. Patient reported understanding of all edu provided today. Ended session with moist heat to LB. No complaints at end of session.    Comorbidities anemia, DDD, DM, L foot fx and pinning, GERD, HLD, HTN, B TKA, OA R foot and wrist, asthma, systolic murmur, ACL repair, L sciatica, hypothyroidism, endometrial CA in remission 08/2018, chemo-induced neuropathy    Rehab Potential Good    PT Frequency 2x / week    PT Duration 6 weeks    PT Treatment/Interventions ADLs/Self Care Home Management;Cryotherapy;Electrical Stimulation;Moist Heat;Balance training;Therapeutic exercise;Therapeutic activities;Functional mobility training;Stair training;Gait training;DME Instruction;Neuromuscular re-education;Patient/family education;Manual techniques;Taping;Energy conservation;Dry needling;Passive range of motion    PT Next Visit Plan 30 day hold at this time    Consulted and Agree with Plan of Care Patient           Patient will benefit from skilled therapeutic intervention in order to improve the following deficits and impairments:  Decreased endurance,Decreased activity tolerance,Decreased strength,Increased fascial restricitons,Pain,Decreased balance,Decreased mobility,Difficulty walking,Increased muscle spasms,Improper body mechanics,Decreased range of motion,Impaired flexibility,Postural dysfunction  Visit Diagnosis: Chronic bilateral low back pain with bilateral sciatica  Pain in left hip  Pain in  right hip  Difficulty in walking, not elsewhere classified  Unsteadiness on feet     Problem List Patient Active Problem List   Diagnosis Date Noted  . Type 2 diabetes, uncontrolled, with retinopathy ([place] [facility]) 05/07/2019  . Pancytopenia, acquired ([employer]) 04/30/2018  . Bilateral edema of lower extremity 04/09/2018  . Peripheral neuropathy due to chemotherapy ([place]) 04/09/2018  . Endometrial cancer ([place]) 02/19/2018  . Morbid obesity ([place]) 02/19/2018  . Chronic pain 11/03/2017  . Scapholunate ligament  injury, no instability 06/29/2017  . HTN (hypertension) 09/27/2016  . Dyspnea 09/27/2016  . Shingles outbreak 10/20/2015  . 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  . Hypothyroidism 07/10/2015  . OSA (obstructive sleep apnea) 07/10/2015  . Endometrial hyperplasia 07/02/2015  . Bronchitis 06/01/2015  . Cough variant asthma 11/02/2014  . Obesity      Janene Harvey, PT, DPT 03/26/20 6:00 PM   [facility] [address]  [place], [place], [address] Phone: [phone]   Fax:  [phone]  Name: [name] MRN: [id number] Date of Birth: [birth year]  PHYSICAL THERAPY DISCHARGE SUMMARY  Visits from Start of Care: 17  Current functional level related to goals / functional outcomes: See above clinical impression; patient did not return during 30 day hold   Remaining deficits: Decreased lumbar AROM, decreased LE strength, imbalance, difficulty navigating stairs    Education / Equipment: HEP  Plan: Patient agrees to discharge.  Patient goals were partially met. Patient is being discharged due to the patient's request.  ?????     Janene Harvey, PT, DPT 04/28/20 1:50 PM

## 2020-03-30 ENCOUNTER — Ambulatory Visit: Payer: Medicare Other

## 2020-03-30 ENCOUNTER — Telehealth: Payer: Self-pay | Admitting: Family Medicine

## 2020-03-30 ENCOUNTER — Other Ambulatory Visit: Payer: Self-pay | Admitting: Family Medicine

## 2020-03-30 MED ORDER — DIPHENOXYLATE-ATROPINE 2.5-0.025 MG PO TABS: ORAL_TABLET | ORAL | 2 refills | Status: DC

## 2020-03-30 NOTE — Telephone Encounter (Signed)
Please advise 

## 2020-03-30 NOTE — Telephone Encounter (Signed)
Patient states she is going out of town 2022/07/28 due to a death in the family. Because of her past history of chemo, she often has sudden bouts of diarrhea. She wonders if she can "take more than normal amount of Imodium" or if there is something better to control her diarrhea. Please call patient to advise.

## 2020-03-30 NOTE — Telephone Encounter (Signed)
Lomotil eRx'd to take in place of imodium for diarrhea.

## 2020-03-31 ENCOUNTER — Ambulatory Visit: Payer: Medicare Other | Admitting: Internal Medicine

## 2020-03-31 NOTE — Telephone Encounter (Signed)
Patient advised and voiced understanding.  

## 2020-04-01 ENCOUNTER — Encounter: Payer: Medicare Other | Admitting: Physical Therapy

## 2020-04-07 ENCOUNTER — Other Ambulatory Visit: Payer: Self-pay | Admitting: Family Medicine

## 2020-04-12 IMAGING — DX DG LUMBAR SPINE COMPLETE 4+V
5 series · 5 of 5 positions shown · non-contrast
Comparison: CT abdomen pelvis 08/08/2006

CLINICAL DATA: Chronic low back pain over the last 3-4 years, no
injury

EXAM:
LUMBAR SPINE - COMPLETE 4+ VIEW

[l-spine obl (1 of 2)]
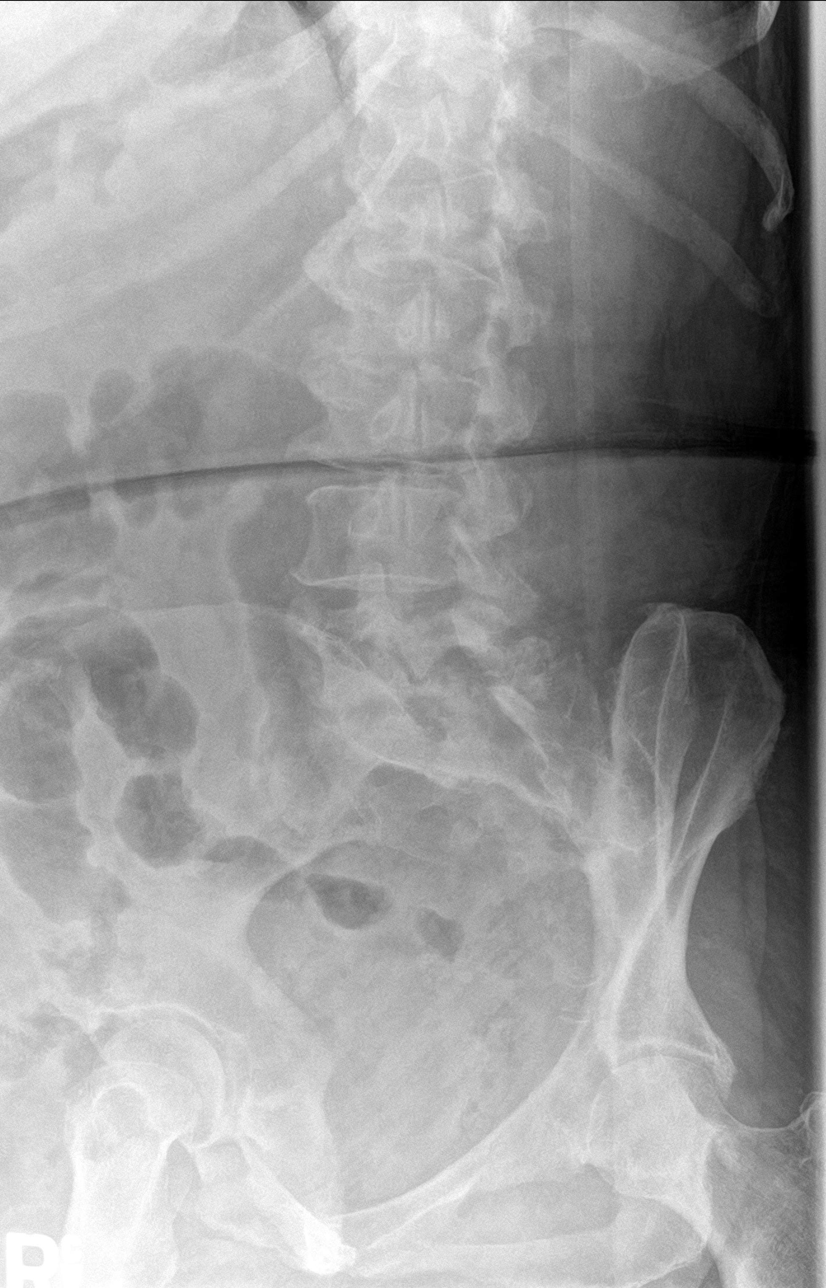

[l-spine obl (2 of 2)]
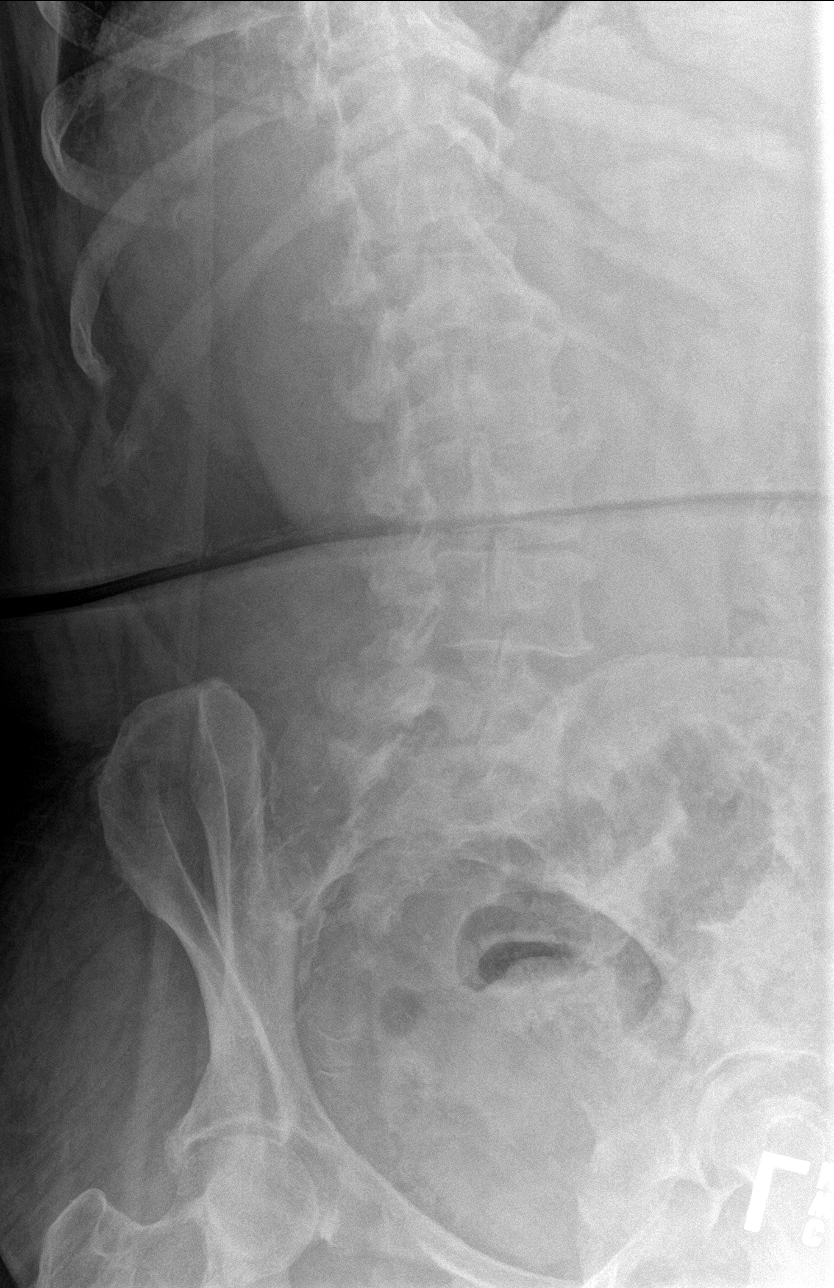

[l-spine lat]
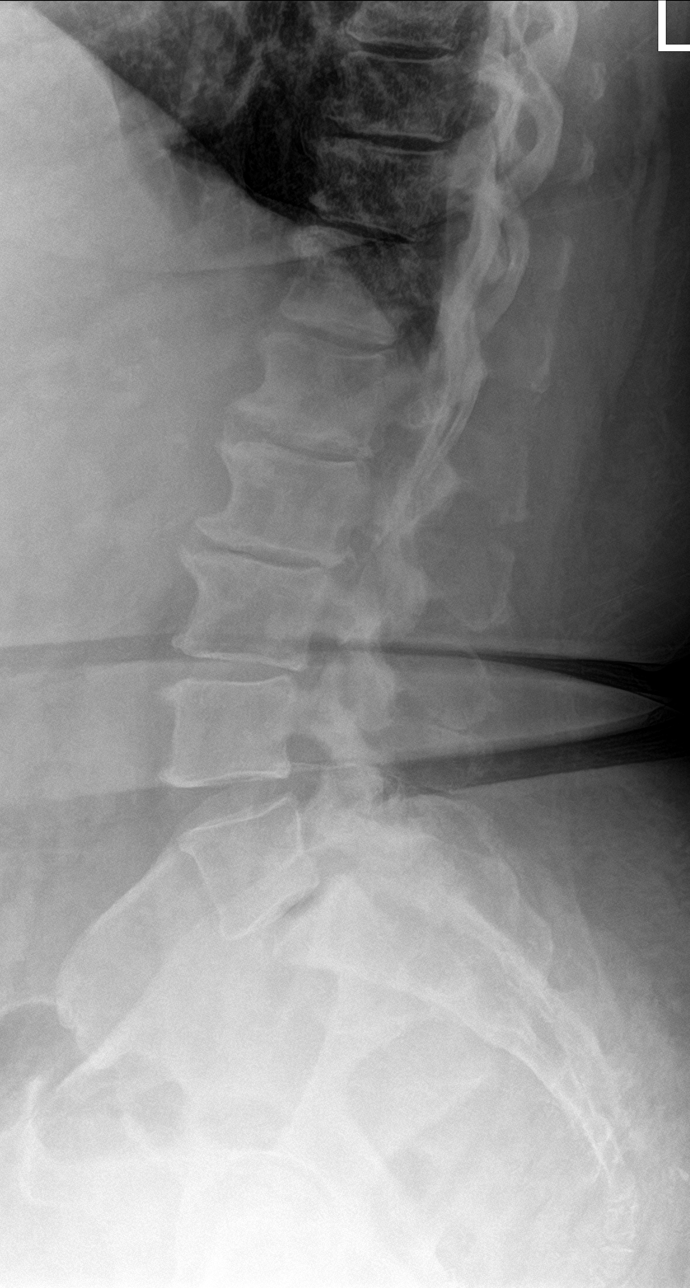

[l-spine spot]
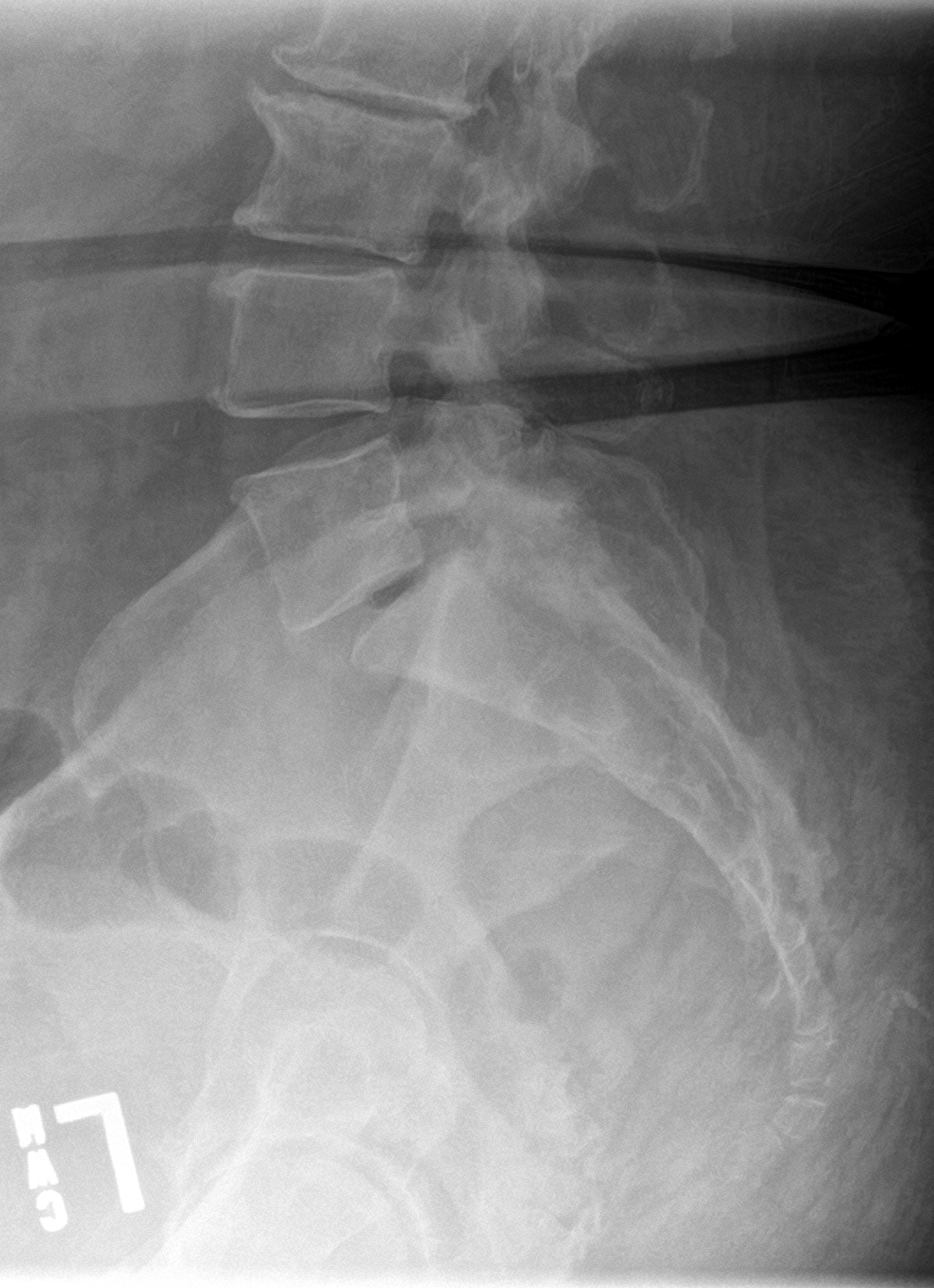

[l-spine ap]
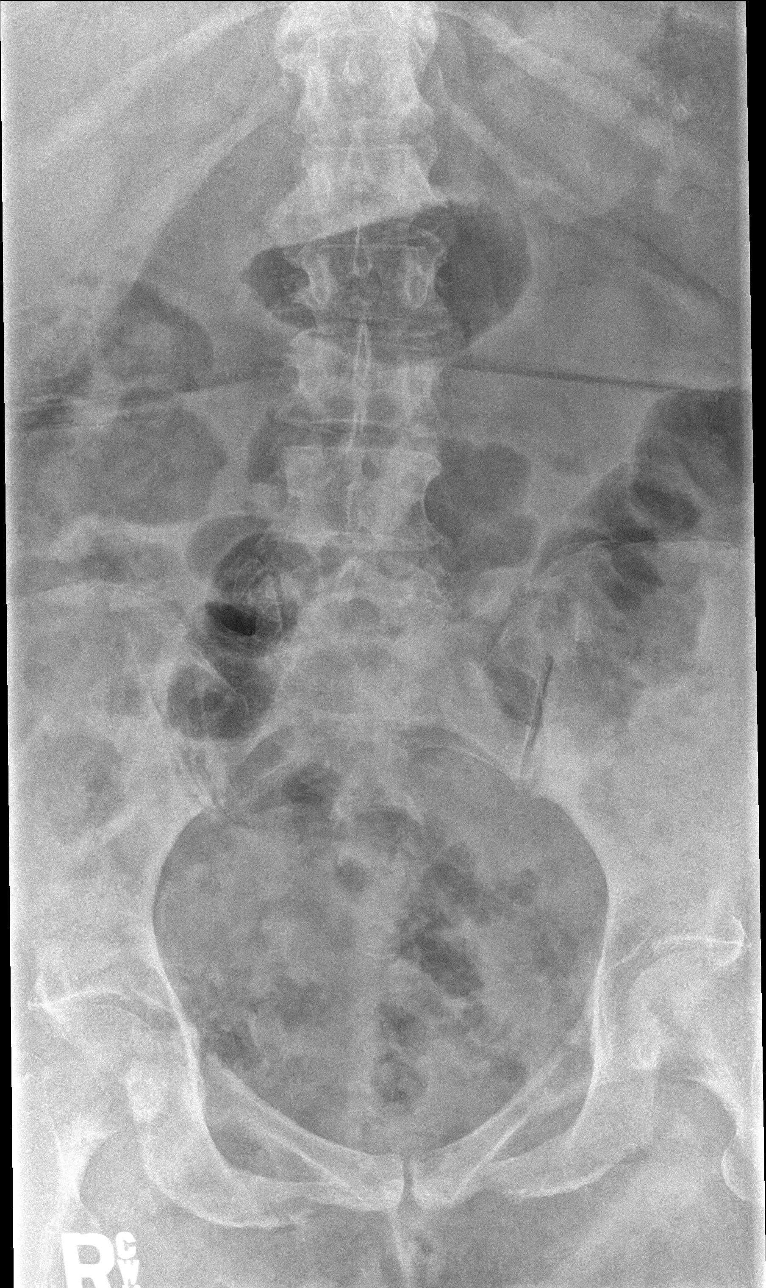

[5 of 5 positions shown; findings below may reference images not displayed]

FINDINGS: There is now 7 mm anterolisthesis of L5 on S1 not seen on the prior
sagittal lumbar spine images from [id number]. This anterolisthesis of L5
on S1 appears to be due to degenerative change of the facet joints.
No pars defect is seen. There is degenerative disc disease at L1-2,
L2-3, and L5-S1 levels. No compression deformity is noted. The SI
joints appear corticated.
IMPRESSION: 1. 7 mm anterolisthesis of L5 on S1 most likely due to degenerative
change of the facet joints.
2. Degenerative disc disease at L1-2, L2-3, and L5-S1 levels.

## 2020-04-12 IMAGING — DX DG FOOT COMPLETE 3+V*R*
3 series · 3 of 3 positions shown · non-contrast
Comparison: No recent prior.

CLINICAL DATA: Right foot pain for several months.

EXAM:
RIGHT FOOT COMPLETE - 3+ VIEW

[foot ap]
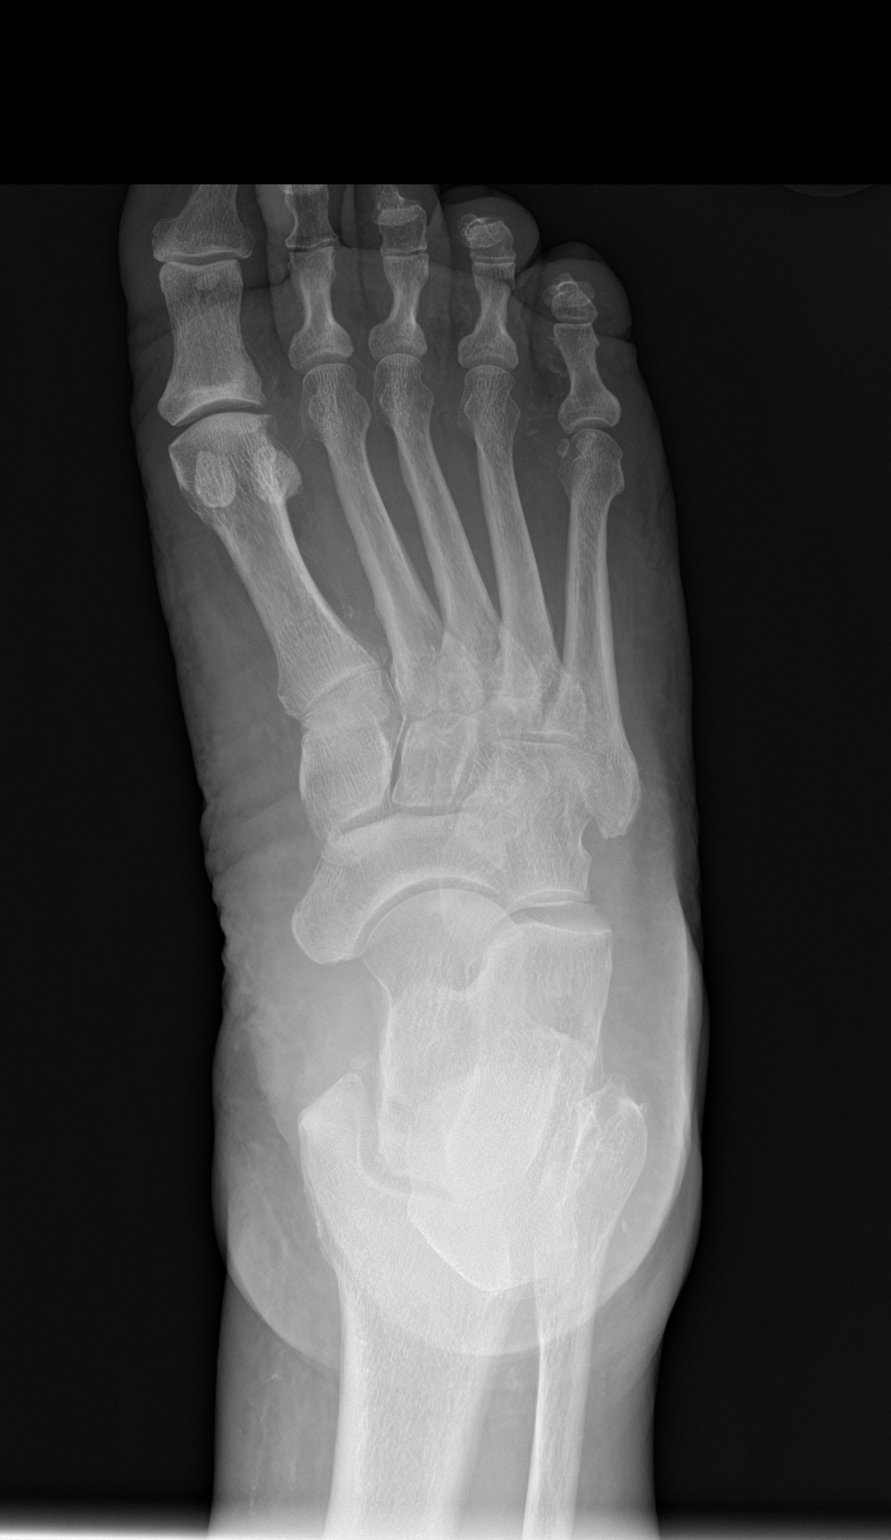

[foot obl]
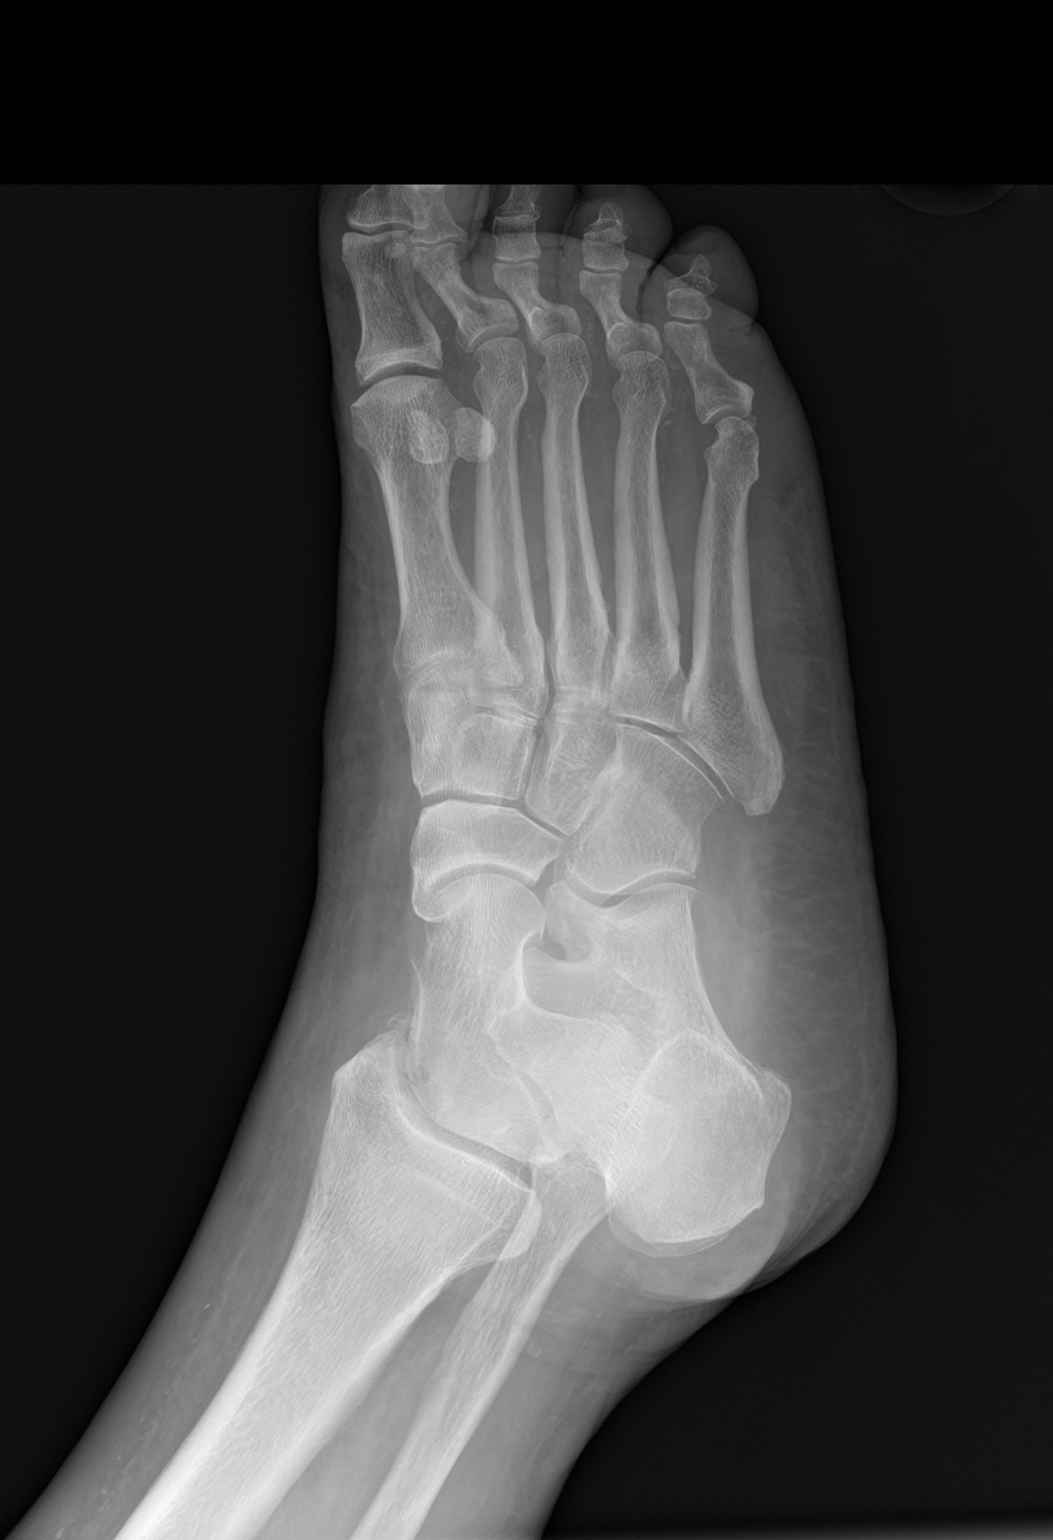

[foot lat]
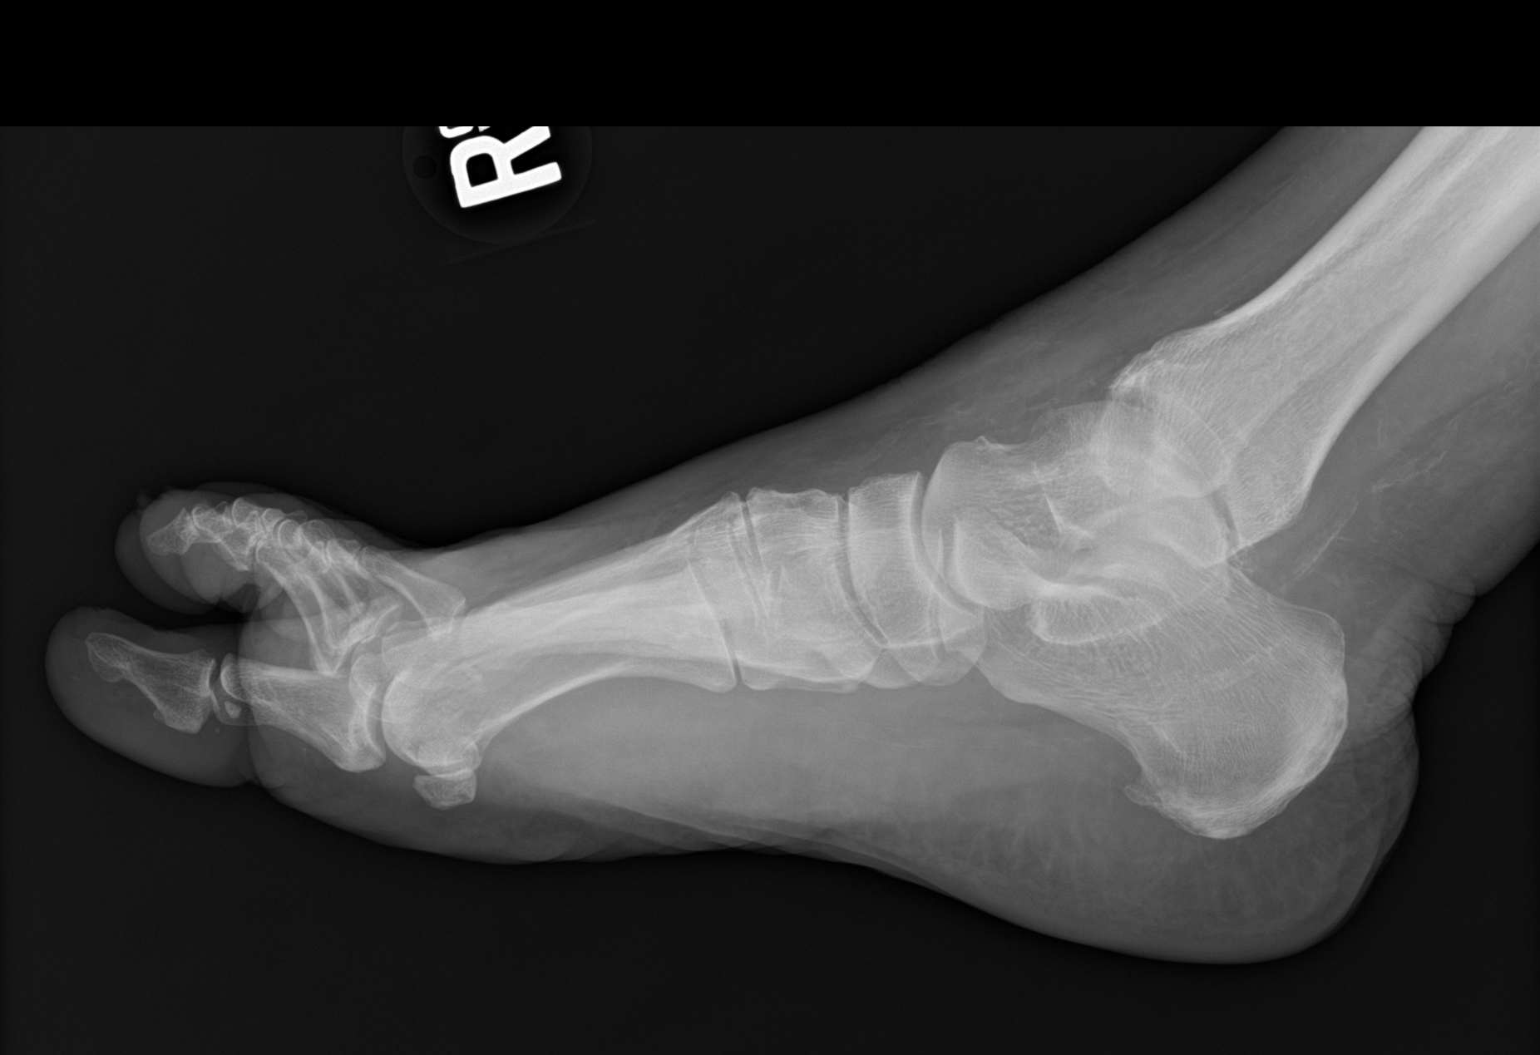

[3 of 3 positions shown; findings below may reference images not displayed]

FINDINGS: Diffuse degenerative change. No evidence of fracture or dislocation.
Peripheral vascular calcification.
IMPRESSION: 1.  Diffuse degenerative change.  No acute abnormality.

2.  Peripheral vascular disease.

## 2020-04-28 NOTE — Progress Notes (Signed)
Subjective:   [name] is a 70 y.o. female who presents for an Initial Medicare Annual Wellness Visit.  I connected with [name] today by telephone and verified that I am speaking with the correct person using two identifiers. Location patient: home Location provider: work Persons participating in the virtual visit: patient, Marine scientist.    I discussed the limitations, risks, security and privacy concerns of performing an evaluation and management service by telephone and the availability of in person appointments. I also discussed with the patient that there may be a patient responsible charge related to this service. The patient expressed understanding and verbally consented to this telephonic visit.    Interactive audio and video telecommunications were attempted between this provider and patient, however failed, due to patient having technical difficulties OR patient did not have access to video capability.  We continued and completed visit with audio only.  Some vital signs may be absent or patient reported.   Time Spent with patient on telephone encounter: 30 minutes   Review of Systems     Cardiac Risk Factors include: advanced age (>22men, >55 women);diabetes mellitus;hypertension;obesity (BMI >30kg/m2);sedentary lifestyle     Objective:    Today's Vitals   04/29/20 1327  Weight: 268 lb (121.6 kg)  Height: 5\' 1"  (1.549 m)  PainSc: 9    Body mass index is 50.64 kg/m.  Advanced Directives 04/29/2020 03/12/2020 02/27/2020 01/01/2020 08/26/2019 02/25/2019 09/20/2018  Does Patient Have a Medical Advance Directive? Yes No Yes Yes Yes Yes Yes  Type of Paramedic of [place];Living will - [place];Living will - [place];Living will Living will [place];Living will  Does patient want to make changes to medical advance directive? - - - No - Patient declined No - Patient declined - -  Copy of [place] in Chart? No - copy requested - No - copy requested - No - copy requested - No - copy requested    Current Medications (verified) Outpatient Encounter Medications as of 04/29/2020  Medication Sig  . albuterol (PROVENTIL) (2.5 MG/3ML) 0.083% nebulizer solution Take 3 mLs (2.5 mg total) by nebulization every 4 (four) hours as needed for wheezing or shortness of breath (Dx: J45.50).  Marland Kitchen albuterol (VENTOLIN HFA) 108 (90 Base) MCG/ACT inhaler INHALE 2 PUFFS INTO LUNGS EVERY 6 HOURS AS NEEDED FOR WHEEZING/SHORTNESS OF BREATH  . amitriptyline (ELAVIL) 25 MG tablet TAKE 1 TABLET BY MOUTH EVERYDAY AT BEDTIME  . budesonide-formoterol (SYMBICORT) 160-4.5 MCG/ACT inhaler TAKE 2 PUFFS BY MOUTH TWICE A DAY  . clobetasol ointment (TEMOVATE) [id number] % Apply 1 application topically at bedtime. Apply to the skin of the vulva for 12 weeks  . diclofenac Sodium (VOLTAREN) 1 % GEL Apply 2 g topically 4 (four) times daily.  . diphenoxylate-atropine (LOMOTIL) 2.5-0.025 MG tablet 1-2 tabs po qid prn diarrhea  . felodipine (PLENDIL) 10 MG 24 hr tablet TAKE 1 TABLET BY MOUTH EVERY DAY  . fluticasone (CUTIVATE) 0.05 % cream APPLY TO AFFECTED AREA OF R LOWER LEG TWICE PER DAY.  . fluticasone (VERAMYST) 27.5 MCG/SPRAY nasal spray Place 1 spray into the nose daily as needed for rhinitis.  . furosemide (LASIX) 20 MG tablet TAKE 1 TABLET BY MOUTH EVERY DAY  . HYDROcodone-acetaminophen (NORCO/VICODIN) 5-325 MG tablet 1-2 tabs po bid prn pain  . insulin aspart (NOVOLOG) 100 UNIT/ML injection USE 20-25 units under skin 3x a day before meals  . insulin NPH Human (NOVOLIN N) 100  UNIT/ML injection Inject 30 units 3x a day under skin  . Insulin Syringes, Disposable, U-100 0.3 ML MISC Use to inject insulin--6 injections per day  . levocetirizine (XYZAL) 5 MG tablet Take 5 mg by mouth at bedtime as needed for allergies.   Marland Kitchen levothyroxine (SYNTHROID) 125 MCG tablet TAKE 1 TABLET BY MOUTH EVERY DAY  . metFORMIN (GLUCOPHAGE)  500 MG tablet TAKE 1 TABLET BY MOUTH EVERY MORNING AND 2 TABLETS BY MOUTH AT BEDTIME. OFFICE VISIT NEEDED  . miconazole (MICOTIN) 2 % powder Apply 1 application topically 2 (two) times daily as needed for itching.  . pravastatin (PRAVACHOL) 40 MG tablet Take 1 tablet (40 mg total) by mouth daily.  . Probiotic Product (PROBIOTIC PO) Take 1 capsule by mouth daily.  . [DISCONTINUED] prochlorperazine (COMPAZINE) 10 MG tablet Take 1 tablet (10 mg total) by mouth every 6 (six) hours as needed (Nausea or vomiting).   No facility-administered encounter medications on file as of 04/29/2020.    Allergies (verified) Adhesive [tape], Banana, Eggs or egg-derived products, Latex, Ozempic (0.25 or 0.5 mg-dose) [semaglutide(0.25 or 0.5mg -dos)], Penicillins, Pine, and Rose   History: Past Medical History:  Diagnosis Date  . Allergic rhinitis    Allergy testing: results pending as of 05/28/16 (Dr. Harold Hedge)  . Anemia   . Arthritis   . Chemotherapy-induced neuropathy ([facility]) 2019  . Cough variant asthma   . DDD (degenerative disc disease), lumbar   . Diabetes mellitus with complication (HCC)    Mild nonprolif DR R eye 12/2017-->referred to retinal specialist DM MANAGED BY DR. GHERGHE AS OF 2021  . Endometrial cancer ([facility]) 02/2018   REMISSION AS OF 08/2018.  Stage III (T3, Nx, Mx)  Path: endometroid adenocarcinoma involving cervix, uterus, and fallopian tubes (Pt got TAH & BSO 02/2018). No sign of recurrence as of onc f/u 05/2019.  Marland Kitchen GERD (gastroesophageal reflux disease)   . Hemorrhoids   . Herpes zoster 10/2015   L side belt-line  . History of adenomatous polyp of colon 02/25/13   5 mm cecal polyp removed by Dr. Trevor Mace 5 yrs  . History of blood transfusion   . History of cellulitis 08/2010   Left (Since replacemnt of Left knee  . Hyperkalemia 03/2018   started after pt started getting chemo-->had to stop enalapril.  Marland Kitchen Hyperlipidemia    Not on statin b/c lipids "stayed down when sugars came down"  per pt.  She says Dr. Chalmers Cater knows she is not on statin anymore.  . Hypertension   . Hypothyroidism   . Nephrolithiasis 4/07  . Obesity   . OSA on CPAP   . Osteoarthritis    knees, ankle, + ? scapholunate ligament disruption (x-ray 06/2017)--ortho referral.  . Pancytopenia due to antineoplastic chemotherapy ([facility])    progressive as of 06/2018. Stable 08/2018.  Marland Kitchen Recurrent UTI    started cipro 250 qd 09/2018  . Sciatica of left side   . Severe persistent asthma    cough-variant--saw Allergist 05/25/16 and was switched from max dose advair to symbicort.  . Systolic murmur    ECHO fine 10/2016-->murmur likely flow murmur assoc with HTN.  Marland Kitchen Zoon's vulvitis    Bx-proven (GYN) -lichen sclerosis.  Clobetasol 0.05% ointment per GYN   Past Surgical History:  Procedure Laterality Date  . ANKLE FRACTURE SURGERY  1984   Pin & repair  . ARTHROSCOPIC REPAIR ACL  2000   Due to ACL tear  . ARTHROSCOPIC REPAIR ACL  5/08  . Blateral knee rerlacements x4  2 times each knee    . BREAST BIOPSY Right 2014   Benign  . CARDIAC CATHETERIZATION  5/07   clear vessel mild mitral   . CARPAL TUNNEL RELEASE Right [id number]   1989 Left  . CESAREAN SECTION  1985  . CHOLECYSTECTOMY OPEN  1988  . COLONOSCOPY N/A 02/25/2013   Tubular adenoma x 1: Recall 5 yrs. Procedure: COLONOSCOPY;  Surgeon: Juanita Craver, MD;  Location: [facility];  Service: Endoscopy;  Laterality: N/A;  . COMBINED HYSTEROSCOPY DIAGNOSTIC / D&C  2/12   Bx neg  . DEXA  08/2007   Bone density normal.  . DILATION AND CURETTAGE OF UTERUS  12/11  . IR IMAGING GUIDED PORT INSERTION  03/16/2018  . IR REMOVAL TUN ACCESS W/ PORT W/O FL MOD SED  09/20/2018  . LYMPH NODE BIOPSY N/A 02/19/2018   Procedure: Sentinel LYMPH NODE BIOPSY;  Surgeon: Everitt Amber, MD;  Location: [facility];  Service: Gynecology;  Laterality: N/A;  . REPLACEMENT TOTAL KNEE Left 5/12   X 2 on each  . ROBOTIC ASSISTED TOTAL HYSTERECTOMY WITH BILATERAL SALPINGO  OOPHERECTOMY N/A 02/19/2018   For endometrial cancer.  Procedure: XI ROBOTIC ASSISTED TOTAL HYSTERECTOMY WITH BILATERAL SALPINGO OOPHORECTOMY;  Surgeon: Everitt Amber, MD;  Location: [facility];  Service: Gynecology;  Laterality: N/A;  . TRANSTHORACIC ECHOCARDIOGRAM  10/11/2016    EF 55-60%, grd I DD.  . TUBAL LIGATION  1985   C-Section   Family History  Problem Relation Age of Onset  . Diabetes Father   . COPD Father   . Stroke Father   . Celiac disease Brother   . Rheum arthritis Brother   . Thyroid disease Brother   . Diabetes Brother   . Dementia Brother        Lewy Body  . Alzheimer's disease Mother   . Arthritis Mother   . Other Son        Died age 28 -Tetrology of Fallot - VSD/pulmonary atresia  . Breast cancer Other        postmenopausal when diagnosed   Social History   Socioeconomic History  . Marital status: Married    Spouse name: Louie Casa  . Number of children: 2  . Years of education: Not on file  . Highest education level: Not on file  Occupational History  . Occupation: retired Therapist, sports  Tobacco Use  . Smoking status: Never Smoker  . Smokeless tobacco: Never Used  Vaping Use  . Vaping Use: Never used  Substance and Sexual Activity  . Alcohol use: No    Alcohol/week: 0.0 standard drinks  . Drug use: No  . Sexual activity: Never    Partners: Male    Birth control/protection: I.U.D.  Other Topics Concern  . Not on file  Social History Narrative  . Not on file   Social Determinants of Health   Financial Resource Strain: Low Risk   . Difficulty of Paying Living Expenses: Not hard at all  Food Insecurity: No Food Insecurity  . Worried About Charity fundraiser in the Last Year: Never true  . Ran Out of Food in the Last Year: Never true  Transportation Needs: No Transportation Needs  . Lack of Transportation (Medical): No  . Lack of Transportation (Non-Medical): No  Physical Activity: Insufficiently Active  . Days of Exercise per Week: 4  days  . Minutes of Exercise per Session: 20 min  Stress: No Stress Concern Present  . Feeling of Stress : Not  at all  Social Connections: Moderately Isolated  . Frequency of Communication with Friends and Family: More than three times a week  . Frequency of Social Gatherings with Friends and Family: Once a week  . Attends Religious Services: Never  . Active Member of Clubs or Organizations: No  . Attends Archivist Meetings: Never  . Marital Status: Married    Tobacco Counseling Counseling given: Not Answered   Clinical Intake:  Pre-visit preparation completed: Yes  Pain : 0-10 Pain Score: 9  Pain Type: Chronic pain,Neuropathic pain Pain Location: Generalized (hips, back, feet, right hand) Pain Onset: More than a month ago Pain Frequency: Constant Pain Relieving Factors: Aleve during the day, hydrocodone at night  Pain Relieving Factors: Aleve during the day, hydrocodone at night  Nutritional Status: BMI > 30  Obese Nutritional Risks: None Diabetes: Yes CBG done?: No Did pt. bring in CBG monitor from home?: No (phone)  How often do you need to have someone help you when you read instructions, pamphlets, or other written materials from your doctor or pharmacy?: 1 - Never  Diabetes:  Is the patient diabetic?  Yes  If diabetic, was a CBG obtained today?  No  Did the patient bring in their glucometer from home?  No phone visit How often do you monitor your CBG's? daily.   Financial Strains and Diabetes Management:  Are you having any financial strains with the device, your supplies or your medication? No .  Does the patient want to be seen by Chronic Care Management for management of their diabetes?  No  Would the patient like to be referred to a Nutritionist or for Diabetic Management?  No   Diabetic Exams:  Diabetic Eye Exam: . Overdue for diabetic eye exam. Pt has been advised about the importance in completing this exam. Patient plans to make ann appt  soon  Diabetic Foot Exam:  Pt has been advised about the importance in completing this exam. To be completed by PCP     Interpreter Needed?: No  Information entered by :: Caroleen Hamman LPN   Activities of Daily Living In your present state of health, do you have any difficulty performing the following activities: 04/29/2020  Hearing? N  Vision? N  Difficulty concentrating or making decisions? Y  Comment occasionally  Walking or climbing stairs? N  Dressing or bathing? N  Doing errands, shopping? N  Preparing Food and eating ? N  Using the Toilet? N  In the past six months, have you accidently leaked urine? Y  Comment occasionally with coughing or sneezing  Do you have problems with loss of bowel control? N  Managing your Medications? N  Managing your Finances? N  Housekeeping or managing your Housekeeping? N  Some recent data might be hidden    Patient Care Team: Tammi Sou, MD as PCP - General (Family Medicine) Harold Hedge, Darrick Grinder, MD as Consulting Physician (Allergy and Immunology) Juanita Craver, MD as Consulting Physician (Gastroenterology) [place], Peter M, MD as Consulting Physician (Cardiology) Megan Salon, MD as Consulting Physician (Gynecology) Isabel Caprice, MD as Consulting Physician (Gynecologic Oncology) Everitt Amber, MD as Consulting Physician (Gynecologic Oncology) Heath Lark, MD as Consulting Physician (Hematology and Oncology) Philemon Kingdom, MD as Consulting Physician (Endocrinology)  Indicate any recent Medical Services you may have received from other than [facility] providers in the past year (date may be approximate).     Assessment:   This is a routine wellness examination for [place].  Hearing/Vision  screen  Hearing Screening   125Hz  250Hz  500Hz  1000Hz  2000Hz  3000Hz  4000Hz  6000Hz  8000Hz   Right ear:           Left ear:           Comments: No issues  Vision Screening Comments: Wears glasses Last eye exam-several years ago -Plans to  make an appt soon  Dietary issues and exercise activities discussed: Current Exercise Habits: Home exercise routine, Type of exercise: strength training/weights, Time (Minutes): 20, Frequency (Times/Week): 4, Weekly Exercise (Minutes/Week): 80, Intensity: Mild, Exercise limited by: orthopedic condition(s)  Goals    . Patient Stated     Increase activity as tolerated      Depression Screen PHQ 2/9 Scores 04/29/2020 11/13/2019 08/22/2018 03/07/2018 01/03/2018 11/17/2016 11/16/2016  PHQ - 2 Score 0 0 0 1 0 0 0    Fall Risk Fall Risk  04/29/2020 08/22/2018 03/07/2018 01/03/2018 11/17/2016  Falls in the past year? 1 0 1 Yes Yes  Number falls in past yr: 1 0 0 1 2 or more  Injury with Fall? 0 0 0 No No  Risk Factor Category  - - - - High Fall Risk  Risk for fall due to : History of fall(s) - - Other (Comment) History of fall(s);Impaired balance/gait;Impaired mobility  Follow up Falls prevention discussed Falls evaluation completed - - Falls prevention discussed;Education provided    FALL RISK PREVENTION PERTAINING TO THE HOME:  Any stairs in or around the home? Yes  If so, are there any without handrails? No  Home free of loose throw rugs in walkways, pet beds, electrical cords, etc? Yes  Adequate lighting in your home to reduce risk of falls? Yes   ASSISTIVE DEVICES UTILIZED TO PREVENT FALLS:  Life alert? No  Use of a cane, walker or w/c? Yes  Grab bars in the bathroom? Yes  Shower chair or bench in shower? Yes  Elevated toilet seat or a handicapped toilet? No   TIMED UP AND GO:  Was the test performed? No . Phone visit   Cognitive Function:Normal cognitive status assessed by this Nurse Health Advisor. No abnormalities found.          Immunizations Immunization History  Administered Date(s) Administered  . DT (Pediatric) 04/11/1992  . Influenza Inj Mdck Quad Pf 12/18/2016  . Influenza, Quadrivalent, Recombinant, Inj, Pf 12/28/2015, 12/30/2018  . Influenza,trivalent, recombinat,  inj, PF 03/20/2015  . Influenza-Unspecified 01/08/2018, 01/15/2020  . Moderna Sars-Covid-2 Vaccination 07/01/2019, 08/01/2019  . Pneumococcal Conjugate-13 06/28/2017  . Pneumococcal Polysaccharide-23 04/12/1999, 05/03/2016  . Td 08/10/2006  . Tdap 10/17/2016    TDAP status: Up to date  Flu Vaccine status: Up to date  Pneumococcal vaccine status: Up to date  Covid-19 vaccine status: Information provided on how to obtain vaccines.  Due for Booster  Qualifies for Shingles Vaccine? Yes   Zostavax completed No   Shingrix Completed?: No.    Education has been provided regarding the importance of this vaccine. Patient has been advised to call insurance company to determine out of pocket expense if they have not yet received this vaccine. Advised may also receive vaccine at local pharmacy or Health Dept. Verbalized acceptance and understanding.  Screening Tests Health Maintenance  Topic Date Due  . URINE MICROALBUMIN  Never done  . OPHTHALMOLOGY EXAM  06/21/2018  . FOOT EXAM  10/05/2018  . COVID-19 Vaccine (3 - Moderna risk 4-dose series) 08/29/2019  . MAMMOGRAM  03/04/2020  . HEMOGLOBIN A1C  06/24/2020  . COLONOSCOPY (Pts 45-53yrs Insurance coverage  will need to be confirmed)  02/26/2023  . TETANUS/TDAP  10/18/2026  . INFLUENZA VACCINE  Completed  . DEXA SCAN  Completed  . Hepatitis C Screening  Completed  . PNA vac Low Risk Adult  Completed  . PAP SMEAR-Modifier  Discontinued    Health Maintenance  Health Maintenance Due  Topic Date Due  . URINE MICROALBUMIN  Never done  . OPHTHALMOLOGY EXAM  06/21/2018  . FOOT EXAM  10/05/2018  . COVID-19 Vaccine (3 - Moderna risk 4-dose series) 08/29/2019  . MAMMOGRAM  03/04/2020    Colorectal cancer screening: Type of screening: Colonoscopy. Completed 02/25/2013. Repeat every 10 years  Mammogram status: Ordered today. Pt provided with contact info and advised to call to schedule appt.   Bone Density status: Ordered today. Pt  provided with contact info and advised to call to schedule appt.  Lung Cancer Screening: (Low Dose CT Chest recommended if Age 36-80 years, 30 pack-year currently smoking OR have quit w/in 15years.) does not qualify.    Additional Screening:  Hepatitis C Screening: Completed 06/28/2017  Vision Screening: Recommended annual ophthalmology exams for early detection of glaucoma and other disorders of the eye. Is the patient up to date with their annual eye exam?  No  Who is the provider or what is the name of the office in which the patient attends annual eye exams? unsure   Dental Screening: Recommended annual dental exams for proper oral hygiene  Community Resource Referral / Chronic Care Management: CRR required this visit?  No   CCM required this visit?  No      Plan:     I have personally reviewed and noted the following in the patient's chart:   . Medical and social history . Use of alcohol, tobacco or illicit drugs  . Current medications and supplements . Functional ability and status . Nutritional status . Physical activity . Advanced directives . List of other physicians . Hospitalizations, surgeries, and ER visits in previous 12 months . Vitals . Screenings to include cognitive, depression, and falls . Referrals and appointments  In addition, I have reviewed and discussed with patient certain preventive protocols, quality metrics, and best practice recommendations. A written personalized care plan for preventive services as well as general preventive health recommendations were provided to patient.   Due to this being a telephonic visit, the after visit summary with patients personalized plan was offered to patient via mail or my-chart. Patient would like to access on my-chart.   Marta Antu, LPN   [id number]  Nurse Health Advisor  Nurse Notes: None

## 2020-04-29 ENCOUNTER — Ambulatory Visit (INDEPENDENT_AMBULATORY_CARE_PROVIDER_SITE_OTHER): Payer: Medicare Other

## 2020-04-29 VITALS — Ht 61.0 in | Wt 268.0 lb

## 2020-04-29 DIAGNOSIS — Z780 Asymptomatic menopausal state: Secondary | ICD-10-CM | POA: Diagnosis not present

## 2020-04-29 DIAGNOSIS — Z1231 Encounter for screening mammogram for malignant neoplasm of breast: Secondary | ICD-10-CM | POA: Diagnosis not present

## 2020-04-29 DIAGNOSIS — Z0000 Encounter for general adult medical examination without abnormal findings: Secondary | ICD-10-CM

## 2020-04-29 NOTE — Patient Instructions (Signed)
[name] , Thank you for taking time to complete your Medicare Wellness Visit. I appreciate your ongoing commitment to your health goals. Please review the following plan we discussed and let me know if I can assist you in the future.   Screening recommendations/referrals: Colonoscopy: Completed 02/25/2013-Due-Per our conversation , you will discuss referral to a different GI doctor with PCP. Mammogram: Ordered today. Someone will be calling you to schedule. Bone Density: Ordered today. Someone will be calling you to schedule. Recommended yearly ophthalmology/optometry visit for glaucoma screening and checkup Recommended yearly dental visit for hygiene and checkup  Vaccinations: Influenza vaccine: Up to date Pneumococcal vaccine: Completed vaccines Tdap vaccine: Up to date-Due-10/18/2026 Shingles vaccine: Discuss with pharmacy   Covid-19:Due for Booster  Advanced directives: Please bring a copy for your chart  Conditions/risks identified: See problem list  Next appointment: Follow up in one year for your annual wellness visit 05/05/21 @ 1:30   Preventive Care 70 Years and Older, Female Preventive care refers to lifestyle choices and visits with your health care provider that can promote health and wellness. What does preventive care include?  A yearly physical exam. This is also called an annual well check.  Dental exams once or twice a year.  Routine eye exams. Ask your health care provider how often you should have your eyes checked.  Personal lifestyle choices, including:  Daily care of your teeth and gums.  Regular physical activity.  Eating a healthy diet.  Avoiding tobacco and drug use.  Limiting alcohol use.  Practicing safe sex.  Taking low-dose aspirin every day.  Taking vitamin and mineral supplements as recommended by your health care provider. What happens during an annual well check? The services and screenings done by your health care provider during  your annual well check will depend on your age, overall health, lifestyle risk factors, and family history of disease. Counseling  Your health care provider may ask you questions about your:  Alcohol use.  Tobacco use.  Drug use.  Emotional well-being.  Home and relationship well-being.  Sexual activity.  Eating habits.  History of falls.  Memory and ability to understand (cognition).  Work and work Statistician.  Reproductive health. Screening  You may have the following tests or measurements:  70eight, weight, and BMI.  Blood pressure.  Lipid and cholesterol levels. These may be checked every 5 years, or more frequently if you are over 37 years old.  Skin check.  Lung cancer screening. You may have this screening every year starting at age 70 if you have a 30-pack-year history of smoking and currently smoke or have quit within the past 15 years.  Fecal occult blood test (FOBT) of the stool. You may have this test every year starting at age 70.  Flexible sigmoidoscopy or colonoscopy. You may have a sigmoidoscopy every 5 years or a colonoscopy every 10 years starting at age 70.  Hepatitis C blood test.  Hepatitis B blood test.  Sexually transmitted disease (STD) testing.  Diabetes screening. This is done by checking your blood sugar (glucose) after you have not eaten for a while (fasting). You may have this done every 70-3 years.  Bone density scan. This is done to screen for osteoporosis. You may have this done starting at age 70.  Mammogram. This may be done every 1-2 years. Talk to your health care provider about how often you should have regular mammograms. Talk with your health care provider about your test results, treatment options, and if necessary,  the need for more tests. Vaccines  Your health care provider may recommend certain vaccines, such as:  Influenza vaccine. This is recommended every year.  Tetanus, diphtheria, and acellular pertussis (Tdap,  Td) vaccine. You may need a Td booster every 10 years.  Zoster vaccine. You may need this after age 70.  Pneumococcal 13-valent conjugate (PCV13) vaccine. One dose is recommended after age 70.  Pneumococcal polysaccharide (PPSV23) vaccine. One dose is recommended after age 70. Talk to your health care provider about which screenings and vaccines you need and how often you need them. This information is not intended to replace advice given to you by your health care provider. Make sure you discuss any questions you have with your health care provider. Document Released: 04/24/2015 Document Revised: 12/16/2015 Document Reviewed: 01/27/2015 Elsevier Interactive Patient Education  2017 [employer] Prevention in the Home Falls can cause injuries. They can happen to people of all ages. There are many things you can do to make your home safe and to help prevent falls. What can I do on the outside of my home?  Regularly fix the edges of walkways and driveways and fix any cracks.  Remove anything that might make you trip as you walk through a door, such as a raised step or threshold.  Trim any bushes or trees on the path to your home.  Use bright outdoor lighting.  Clear any walking paths of anything that might make someone trip, such as rocks or tools.  Regularly check to see if handrails are loose or broken. Make sure that both sides of any steps have handrails.  Any raised decks and porches should have guardrails on the edges.  Have any leaves, snow, or ice cleared regularly.  Use sand or salt on walking paths during winter.  Clean up any spills in your garage right away. This includes oil or grease spills. What can I do in the bathroom?  Use night lights.  Install grab bars by the toilet and in the tub and shower. Do not use towel bars as grab bars.  Use non-skid mats or decals in the tub or shower.  If you need to sit down in the shower, use a plastic, non-slip  stool.  Keep the floor dry. Clean up any water that spills on the floor as soon as it happens.  Remove soap buildup in the tub or shower regularly.  Attach bath mats securely with double-sided non-slip rug tape.  Do not have throw rugs and other things on the floor that can make you trip. What can I do in the bedroom?  Use night lights.  Make sure that you have a light by your bed that is easy to reach.  Do not use any sheets or blankets that are too big for your bed. They should not hang down onto the floor.  Have a firm chair that has side arms. You can use this for support while you get dressed.  Do not have throw rugs and other things on the floor that can make you trip. What can I do in the kitchen?  Clean up any spills right away.  Avoid walking on wet floors.  Keep items that you use a lot in easy-to-reach places.  If you need to reach something above you, use a strong step stool that has a grab bar.  Keep electrical cords out of the way.  Do not use floor polish or wax that makes floors slippery. If you must use  wax, use non-skid floor wax.  Do not have throw rugs and other things on the floor that can make you trip. What can I do with my stairs?  Do not leave any items on the stairs.  Make sure that there are handrails on both sides of the stairs and use them. Fix handrails that are broken or loose. Make sure that handrails are as long as the stairways.  Check any carpeting to make sure that it is firmly attached to the stairs. Fix any carpet that is loose or worn.  Avoid having throw rugs at the top or bottom of the stairs. If you do have throw rugs, attach them to the floor with carpet tape.  Make sure that you have a light switch at the top of the stairs and the bottom of the stairs. If you do not have them, ask someone to add them for you. What else can I do to help prevent falls?  Wear shoes that:  Do not have high heels.  Have rubber bottoms.  Are  comfortable and fit you well.  Are closed at the toe. Do not wear sandals.  If you use a stepladder:  Make sure that it is fully opened. Do not climb a closed stepladder.  Make sure that both sides of the stepladder are locked into place.  Ask someone to hold it for you, if possible.  Clearly mark and make sure that you can see:  Any grab bars or handrails.  First and last steps.  Where the edge of each step is.  Use tools that help you move around (mobility aids) if they are needed. These include:  Canes.  Walkers.  Scooters.  Crutches.  Turn on the lights when you go into a dark area. Replace any light bulbs as soon as they burn out.  Set up your furniture so you have a clear path. Avoid moving your furniture around.  If any of your floors are uneven, fix them.  If there are any pets around you, be aware of where they are.  Review your medicines with your doctor. Some medicines can make you feel dizzy. This can increase your chance of falling. Ask your doctor what other things that you can do to help prevent falls. This information is not intended to replace advice given to you by your health care provider. Make sure you discuss any questions you have with your health care provider. Document Released: 01/22/2009 Document Revised: 09/03/2015 Document Reviewed: 05/02/2014 Elsevier Interactive Patient Education  2017 [employer].

## 2020-05-01 ENCOUNTER — Other Ambulatory Visit: Payer: Self-pay | Admitting: Family Medicine

## 2020-05-12 ENCOUNTER — Ambulatory Visit (HOSPITAL_BASED_OUTPATIENT_CLINIC_OR_DEPARTMENT_OTHER)
Admission: RE | Admit: 2020-05-12 | Discharge: 2020-05-12 | Disposition: A | Discharge status: Home / Self Care | Payer: Medicare Other | Source: Ambulatory Visit | Attending: Family Medicine | Admitting: Family Medicine

## 2020-05-12 ENCOUNTER — Other Ambulatory Visit: Payer: Self-pay

## 2020-05-12 ENCOUNTER — Encounter: Payer: Self-pay | Admitting: Family Medicine

## 2020-05-12 DIAGNOSIS — Z780 Asymptomatic menopausal state: Secondary | ICD-10-CM | POA: Insufficient documentation

## 2020-05-12 DIAGNOSIS — E039 Hypothyroidism, unspecified: Secondary | ICD-10-CM | POA: Diagnosis not present

## 2020-05-12 DIAGNOSIS — Z1231 Encounter for screening mammogram for malignant neoplasm of breast: Secondary | ICD-10-CM | POA: Insufficient documentation

## 2020-05-12 DIAGNOSIS — E2839 Other primary ovarian failure: Secondary | ICD-10-CM | POA: Diagnosis not present

## 2020-05-12 DIAGNOSIS — Z90710 Acquired absence of both cervix and uterus: Secondary | ICD-10-CM | POA: Diagnosis not present

## 2020-05-15 ENCOUNTER — Ambulatory Visit (INDEPENDENT_AMBULATORY_CARE_PROVIDER_SITE_OTHER): Payer: Medicare Other | Admitting: Internal Medicine

## 2020-05-15 ENCOUNTER — Other Ambulatory Visit: Payer: Self-pay

## 2020-05-15 ENCOUNTER — Encounter: Payer: Self-pay | Admitting: Internal Medicine

## 2020-05-15 VITALS — BP 130/82 | HR 78 | Ht 61.0 in | Wt 266.4 lb

## 2020-05-15 DIAGNOSIS — E1165 Type 2 diabetes mellitus with hyperglycemia: Secondary | ICD-10-CM | POA: Diagnosis not present

## 2020-05-15 DIAGNOSIS — E11319 Type 2 diabetes mellitus with unspecified diabetic retinopathy without macular edema: Secondary | ICD-10-CM

## 2020-05-15 DIAGNOSIS — E785 Hyperlipidemia, unspecified: Secondary | ICD-10-CM

## 2020-05-15 DIAGNOSIS — IMO0002 Reserved for concepts with insufficient information to code with codable children: Secondary | ICD-10-CM

## 2020-05-15 LAB — POCT GLYCOSYLATED HEMOGLOBIN (HGB A1C): Hemoglobin A1C: 9.1 % — AB (ref 4.0–5.6)

## 2020-05-15 NOTE — Addendum Note (Signed)
Addended by: Lauralyn Primes on: 05/15/2020 04:06 PM   Modules accepted: Orders

## 2020-05-15 NOTE — Patient Instructions (Addendum)
Patient Instructions  Please continue: - Metformin 500 mg in am and 1000 mg with dinner - NPH 30 units 3x a day - Novolog 20-25 units 3x a day, before meals  Look into a CGM.  Try to schedule a new eye exam.  Please come back for a follow-up appointment in 1.5 months.

## 2020-05-15 NOTE — Progress Notes (Signed)
Patient ID: [name], female   DOB: 11-27-1950, 70 y.o.   MRN: [id number]   This visit occurred during the SARS-CoV-2 public health emergency.  Safety protocols were in place, including screening questions prior to the visit, additional usage of staff PPE, and extensive cleaning of exam room while observing appropriate contact time as indicated for disinfecting solutions.   HPI: [name] is a 70 y.o.-year-old female, initially referred by her PCP, Dr. Ernestine Conrad, returning for follow-up for DM2, dx in 1992, insulin-dependent since 2004-2005, uncontrolled, with complications (DR).  Last visit 4.5 months ago.  She is here with her husband who offers part of the history especially related to her activity and past medical history. On [facility].  Her brother died before [place] (Lewy body dementia).  Reviewed HbA1c levels: Lab Results  Component Value Date   HGBA1C 7.7 (A) 12/26/2019   HGBA1C 6.6 (A) 05/07/2019   HGBA1C 8.8 (H) 01/21/2019   HGBA1C 7.5 (H) 10/05/2018   HGBA1C 7.0 (A) 05/18/2018   HGBA1C 7.1 (H) 02/15/2018   HGBA1C 7.3 (A) 01/03/2018   HGBA1C 9.4 (H) 10/04/2017   HGBA1C 8.7 (H) 06/28/2017   HGBA1C 7.9 (H) 02/13/2017   Previously on: - Metformin (tablets) 500 mg in am and 1000 mg with dinner - NPH 52 units 3x a day - Novolog 12 (-18) units 3x a day, before meals She was on Lantus >> expensive.  We changed to: - Metformin 500 mg in am and 1000 mg with dinner - NPH 30 >> 40 units 3x a day >> 30 units 3x a day - Novolog 10-14 >> 17-20 units >> 20-25 units 3x a day, before meals Tried Ozempic 0.5 mg weekly >> significant diarrhea, abd. Pain, food intolerance >> started 04/2019 -stopped 07/2019  Pt is not checking sugars now. From last OV: - am: 80-140 >> 120-140 - 2h after b'fast: n/c - before lunch: n/c >> 120-140 - 2h after lunch: n/c - before dinner: n/c - 2h after dinner: n/c - bedtime: 50s, 115-120, 200 >> <140 - nighttime: n/c Lowest sugar was 50 >>  65-70 (more active) >> ?; she has hypoglycemia awareness in the 70s.  Highest sugar was 300 - pizza >> 200 >> ?Marland Kitchen  Glucometer: [facility]  Pt's meals are: - Breakfast: oatmeal - instant, peacans, butter - Lunch: tortilla + PB and J or [place] and cheese - Dinner: meat (hamburger, chicken, bratwurst), spaghetti, green beans, mixed veggies - Snacks:   -No CKD, last BUN/creatinine:  Lab Results  Component Value Date   BUN 25 (H) 12/18/2019   BUN 24 (H) 01/21/2019   CREATININE 0.84 12/18/2019   CREATININE 0.79 01/21/2019   -+ HL; last set of lipids: Lab Results  Component Value Date   CHOL 172 12/18/2019   HDL 39.60 12/18/2019   LDLCALC 99 12/18/2019   LDLDIRECT 78.0 10/05/2018   TRIG 164.0 (H) 12/18/2019   CHOLHDL 4 12/18/2019  On pravastatin 20.  - last eye exam was in 06/2017: + DR  -+ numbness and tingling in her feet -chemotherapy related.  Pt has FH of DM in daughter, brother, father, P aunts.  She also has a history of endometrial cancer - 2019, also, OSA, chemotherapy-induced peripheral neuropathy, hypothyroidism-on levothyroxine 125 mcg daily.  Latest TSH was normal: Lab Results  Component Value Date   TSH 2.88 12/18/2019   She lost a child at 70 y/o in 1990 >> she gained 100 lbs and was dx'ed with DM afterwards.  No h/o pancreatitis  or FH of MTC.  ROS: Constitutional: no weight gain/no weight loss, + fatigue, no subjective hyperthermia, no subjective hypothermia, + poor sleep Eyes: no blurry vision, no xerophthalmia ENT: no sore throat, no nodules palpated in neck, no dysphagia, no odynophagia, no hoarseness Cardiovascular: no CP/no SOB/no palpitations/no leg swelling Respiratory: no cough/no SOB/no wheezing Gastrointestinal: no N/no V/no D/no C/+ acid reflux Musculoskeletal: no muscle aches/no joint aches Skin: no rashes, no hair loss Neurological: no tremors/+ numbness/+ tingling/no dizziness  I reviewed pt's medications, allergies, PMH, social hx, family hx,  and changes were documented in the history of present illness. Otherwise, unchanged from my initial visit note.  Past Medical History:  Diagnosis Date  . Allergic rhinitis    Allergy testing: results pending as of 05/28/16 (Dr. Harold Hedge)  . Anemia   . Arthritis   . Chemotherapy-induced neuropathy ([place]) 2019  . Cough variant asthma   . DDD (degenerative disc disease), lumbar   . Diabetes mellitus with complication (HCC)    Mild nonprolif DR R eye 12/2017-->referred to retinal specialist DM MANAGED BY DR. Avilyn Virtue AS OF 2021  . Endometrial cancer ([place]) 02/2018   REMISSION AS OF 08/2018.  Stage III (T3, Nx, Mx)  Path: endometroid adenocarcinoma involving cervix, uterus, and fallopian tubes (Pt got TAH & BSO 02/2018). No sign of recurrence as of onc f/u 05/2019.  Marland Kitchen GERD (gastroesophageal reflux disease)   . Hemorrhoids   . Herpes zoster 10/2015   L side belt-line  . History of adenomatous polyp of colon 02/25/13   5 mm cecal polyp removed by Dr. Trevor Mace 5 yrs  . History of blood transfusion   . History of cellulitis 08/2010   Left (Since replacemnt of Left knee  . Hyperkalemia 03/2018   started after pt started getting chemo-->had to stop enalapril.  Marland Kitchen Hyperlipidemia    Not on statin b/c lipids "stayed down when sugars came down" per pt.  She says Dr. Chalmers Cater knows she is not on statin anymore.  . Hypertension   . Hypothyroidism   . Nephrolithiasis 4/07  . Obesity   . OSA on CPAP   . Osteoarthritis    knees, ankle, + ? scapholunate ligament disruption (x-ray 06/2017)--ortho referral.  . Pancytopenia due to antineoplastic chemotherapy ([place])    progressive as of 06/2018. Stable 08/2018.  Marland Kitchen Recurrent UTI    started cipro 250 qd 09/2018  . Sciatica of left side   . Severe persistent asthma    cough-variant--saw Allergist 05/25/16 and was switched from max dose advair to symbicort.  . Systolic murmur    ECHO fine 10/2016-->murmur likely flow murmur assoc with HTN.  Marland Kitchen Zoon's vulvitis     Bx-proven (GYN) -lichen sclerosis.  Clobetasol 0.05% ointment per GYN   Past Surgical History:  Procedure Laterality Date  . ANKLE FRACTURE SURGERY  1984   Pin & repair  . ARTHROSCOPIC REPAIR ACL  2000   Due to ACL tear  . ARTHROSCOPIC REPAIR ACL  5/08  . Blateral knee rerlacements x4 2 times each knee    . BREAST BIOPSY Right 2014   Benign  . CARDIAC CATHETERIZATION  5/07   clear vessel mild mitral   . CARPAL TUNNEL RELEASE Right [id number]   1989 Left  . CESAREAN SECTION  1985  . CHOLECYSTECTOMY OPEN  1988  . COLONOSCOPY N/A 02/25/2013   Tubular adenoma x 1: Recall 5 yrs. Procedure: COLONOSCOPY;  Surgeon: Juanita Craver, MD;  Location: [facility];  Service: Endoscopy;  Laterality: N/A;  .  COMBINED HYSTEROSCOPY DIAGNOSTIC / D&C  2/12   Bx neg  . DEXA  08/2007; 05/12/20   Bone density normal 2009 and 2022.  Plan rpt 2024.  Marland Kitchen DILATION AND CURETTAGE OF UTERUS  12/11  . IR IMAGING GUIDED PORT INSERTION  03/16/2018  . IR REMOVAL TUN ACCESS W/ PORT W/O FL MOD SED  09/20/2018  . LYMPH NODE BIOPSY N/A 02/19/2018   Procedure: Sentinel LYMPH NODE BIOPSY;  Surgeon: Everitt Amber, MD;  Location: [facility];  Service: Gynecology;  Laterality: N/A;  . REPLACEMENT TOTAL KNEE Left 5/12   X 2 on each  . ROBOTIC ASSISTED TOTAL HYSTERECTOMY WITH BILATERAL SALPINGO OOPHERECTOMY N/A 02/19/2018   For endometrial cancer.  Procedure: XI ROBOTIC ASSISTED TOTAL HYSTERECTOMY WITH BILATERAL SALPINGO OOPHORECTOMY;  Surgeon: Everitt Amber, MD;  Location: [facility];  Service: Gynecology;  Laterality: N/A;  . TRANSTHORACIC ECHOCARDIOGRAM  10/11/2016    EF 55-60%, grd I DD.  . TUBAL LIGATION  1985   C-Section   Social History   Socioeconomic History  . Marital status: Married    Spouse name: Louie Casa  . Number of children: 2  . Years of education: Not on file  . Highest education level: Not on file  Occupational History  . Occupation: retired Therapist, sports  Tobacco Use  . Smoking status:  Never Smoker  . Smokeless tobacco: Never Used  Vaping Use  . Vaping Use: Never used  Substance and Sexual Activity  . Alcohol use: No    Alcohol/week: 0.0 standard drinks  . Drug use: No  . Sexual activity: Never    Partners: Male    Birth control/protection: I.U.D.  Other Topics Concern  . Not on file  Social History Narrative  . Not on file   Social Determinants of Health   Financial Resource Strain: Low Risk   . Difficulty of Paying Living Expenses: Not hard at all  Food Insecurity: No Food Insecurity  . Worried About Charity fundraiser in the Last Year: Never true  . Ran Out of Food in the Last Year: Never true  Transportation Needs: No Transportation Needs  . Lack of Transportation (Medical): No  . Lack of Transportation (Non-Medical): No  Physical Activity: Insufficiently Active  . Days of Exercise per Week: 4 days  . Minutes of Exercise per Session: 20 min  Stress: No Stress Concern Present  . Feeling of Stress : Not at all  Social Connections: Moderately Isolated  . Frequency of Communication with Friends and Family: More than three times a week  . Frequency of Social Gatherings with Friends and Family: Once a week  . Attends Religious Services: Never  . Active Member of Clubs or Organizations: No  . Attends Archivist Meetings: Never  . Marital Status: Married  Human resources officer Violence: Not At Risk  . Fear of Current or Ex-Partner: No  . Emotionally Abused: No  . Physically Abused: No  . Sexually Abused: No   Current Outpatient Medications on File Prior to Visit  Medication Sig Dispense Refill  . albuterol (PROVENTIL) (2.5 MG/3ML) 0.083% nebulizer solution Take 3 mLs (2.5 mg total) by nebulization every 4 (four) hours as needed for wheezing or shortness of breath (Dx: J45.50). 75 mL 1  . albuterol (VENTOLIN HFA) 108 (90 Base) MCG/ACT inhaler INHALE 2 PUFFS INTO LUNGS EVERY 6 HOURS AS NEEDED FOR WHEEZING/SHORTNESS OF BREATH 18 each 1  .  amitriptyline (ELAVIL) 25 MG tablet TAKE 1 TABLET BY MOUTH EVERYDAY AT  BEDTIME 90 tablet 3  . budesonide-formoterol (SYMBICORT) 160-4.5 MCG/ACT inhaler TAKE 2 PUFFS BY MOUTH TWICE A DAY 10.2 Inhaler 11  . clobetasol ointment (TEMOVATE) [id number] % Apply 1 application topically at bedtime. Apply to the skin of the vulva for 12 weeks 30 g 2  . diclofenac Sodium (VOLTAREN) 1 % GEL Apply 2 g topically 4 (four) times daily. 100 g 3  . diphenoxylate-atropine (LOMOTIL) 2.5-0.025 MG tablet 1-2 tabs po qid prn diarrhea 30 tablet 2  . felodipine (PLENDIL) 10 MG 24 hr tablet TAKE 1 TABLET BY MOUTH EVERY DAY 90 tablet 0  . fluticasone (CUTIVATE) 0.05 % cream APPLY TO AFFECTED AREA OF R LOWER LEG TWICE PER DAY. 30 g 0  . fluticasone (VERAMYST) 27.5 MCG/SPRAY nasal spray Place 1 spray into the nose daily as needed for rhinitis.    . furosemide (LASIX) 20 MG tablet TAKE 1 TABLET BY MOUTH EVERY DAY 30 tablet 0  . HYDROcodone-acetaminophen (NORCO/VICODIN) 5-325 MG tablet 1-2 tabs po bid prn pain 90 tablet 0  . insulin aspart (NOVOLOG) 100 UNIT/ML injection USE 20-25 units under skin 3x a day before meals 90 mL 3  . insulin NPH Human (NOVOLIN N) 100 UNIT/ML injection Inject 30 units 3x a day under skin 50 mL 3  . Insulin Syringes, Disposable, U-100 0.3 ML MISC Use to inject insulin--6 injections per day 540 each 3  . levocetirizine (XYZAL) 5 MG tablet Take 5 mg by mouth at bedtime as needed for allergies.   5  . levothyroxine (SYNTHROID) 125 MCG tablet TAKE 1 TABLET BY MOUTH EVERY DAY 90 tablet 2  . metFORMIN (GLUCOPHAGE) 500 MG tablet TAKE 1 TABLET BY MOUTH EVERY MORNING AND 2 TABLETS BY MOUTH AT BEDTIME. OFFICE VISIT NEEDED 90 tablet 0  . miconazole (MICOTIN) 2 % powder Apply 1 application topically 2 (two) times daily as needed for itching.    . pravastatin (PRAVACHOL) 40 MG tablet Take 1 tablet (40 mg total) by mouth daily. 90 tablet 1  . Probiotic Product (PROBIOTIC PO) Take 1 capsule by mouth daily.    .  [DISCONTINUED] prochlorperazine (COMPAZINE) 10 MG tablet Take 1 tablet (10 mg total) by mouth every 6 (six) hours as needed (Nausea or vomiting). 30 tablet 1   No current facility-administered medications on file prior to visit.   Allergies  Allergen Reactions  . Adhesive [Tape] Other (See Comments)    Takes patient's skin off.  . Banana Diarrhea and Nausea Only  . Eggs Or Egg-Derived Products Diarrhea and Nausea Only  . Latex Other (See Comments)    sensativity  . Ozempic (0.25 Or 0.5 Mg-Dose) [Semaglutide(0.25 Or 0.5mg -Dos)] Diarrhea  . Penicillins Rash and Other (See Comments)    Has had cephalosporins without incident  . Pine Swelling and Rash  . Rose Swelling and Rash   Family History  Problem Relation Age of Onset  . Diabetes Father   . COPD Father   . Stroke Father   . Celiac disease Brother   . Rheum arthritis Brother   . Thyroid disease Brother   . Diabetes Brother   . Dementia Brother        Lewy Body  . Alzheimer's disease Mother   . Arthritis Mother   . Other Son        Died age 23 -Tetrology of Fallot - VSD/pulmonary atresia  . Breast cancer Other        postmenopausal when diagnosed    PE: BP 130/82   Pulse 78  Ht 5\' 1"  (1.549 m)   Wt 266 lb 6.4 oz (120.8 kg)   LMP 08/10/2010 Comment: Hysterectomy 02/02/18  SpO2 95%   BMI 50.34 kg/m  Wt Readings from Last 3 Encounters:  05/15/20 266 lb 6.4 oz (120.8 kg)  04/29/20 268 lb (121.6 kg)  03/12/20 296 lb 6 oz (134.4 kg)   Constitutional: overweight, in NAD, in wheelchair Eyes: PERRLA, EOMI, no exophthalmos ENT: moist mucous membranes, no thyromegaly, no cervical lymphadenopathy Cardiovascular: RRR, No MRG, + pitting edema bilateral legs Respiratory: CTA B Gastrointestinal: abdomen soft, NT, ND, BS+ Musculoskeletal: no deformities, strength intact in all 4 Skin: moist, warm, no rashes Neurological: no tremor with outstretched hands, DTR normal in all 4  ASSESSMENT: 1. DM2, insulin-dependent,  uncontrolled, with complications - mild NP DR OD  2. HL  3.  Obesity class III  PLAN:  1. Patient with longstanding, uncontrolled, type 2 diabetes, on Metformin and basal-bolus insulin regimen, adjusted at last visit at that time.  She did have frequent hypoglycemia episodes, especially later in the day but sometimes during the night, with the lowest being in the 50s. We tried to add Ozempic in an effort to be able to decrease her insulin doses to improve her weight, cardiovascular, and kidney outcomes. Unfortunately, she could not tolerate Ozempic due to GI side effects -At last visit she was not checking sugars consistently and I advised her to try to check at least three times a day. She increased her NPH by herself and sugars did improve some, but she still had hyperglycemic peaks. At that time HbA1c was higher, at 7.7%. -At last visit, we decrease the NPH doses and increase NovoLog before meals -At this visit, unfortunately, she is not checking blood sugars so we can make any changes in her regimen. - we checked her HbA1c: 9.1% (higher) -We discussed about the importance of checking blood sugars, since otherwise, we cannot get her diabetes under control.  Her husband has a freestyle libre CGM.  This is covered by his insurance.  At today's visit, we discussed that this would be a wonderful addition for her and I advised him to call the supplier if he uses and get them to send me a refill request for the freestyle libre CGM. - I suggested to:  Patient Instructions  Please continue: - Metformin 500 mg in am and 1000 mg with dinner - NPH 30 units 3x a day - Novolog 20-25 units 3x a day, before meals  Look into a CGM.  Try to schedule a new eye exam.  Please come back for a follow-up appointment in 1.5 months.  - advised to check sugars at different times of the day - 4x a day, rotating check times - advised for yearly eye exams >> she is not UTD - return to clinic in 1.5 months  2.  HL -Reviewed latest lipid panel from 12/2019: LDL above target of less than 70, triglycerides slightly high: Lab Results  Component Value Date   CHOL 172 12/18/2019   HDL 39.60 12/18/2019   LDLCALC 99 12/18/2019   LDLDIRECT 78.0 10/05/2018   TRIG 164.0 (H) 12/18/2019   CHOLHDL 4 12/18/2019  -Continues pravastatin 20 mg daily without side effects  3.  Obesity class III -She lost 2 pounds before last visit, now lost 6 more lbs -Unfortunately, she could not tolerate Ozempic, which would have helped with weight loss  Philemon Kingdom, MD PhD [facility] Endocrinology

## 2020-05-19 ENCOUNTER — Telehealth: Payer: Self-pay | Admitting: Family Medicine

## 2020-05-19 NOTE — Telephone Encounter (Signed)
Requesting: hydrocodone Contract: 12/18/19 UDS:12/18/19 Last Visit:12/18/19 Next Visit: advised to f/u 2 mo. Last Refill:02/13/20(90,0)  Please Advise. Medication pending

## 2020-05-19 NOTE — Telephone Encounter (Signed)
Patient requesting refill of hydrocodone, she is now out of this medication. States she called [place] for refill last Thursday. [facility] [place].

## 2020-05-20 MED ORDER — HYDROCODONE-ACETAMINOPHEN 5-325 MG PO TABS: ORAL_TABLET | ORAL | 0 refills | Status: DC

## 2020-05-20 NOTE — Telephone Encounter (Signed)
OK, hydrocodone rx renewed for #90 BUT pt needs to f/u in the next 4-6 wks to discuss long term plan for pain management (she was supposed to have followed up 2 mo after last visit, which was 12/18/19).-thx

## 2020-05-21 NOTE — Telephone Encounter (Signed)
LM for pt to return call regarding medication and follow up appointment needing to be scheduled. See message below for more details.

## 2020-05-22 NOTE — Telephone Encounter (Signed)
LVM for pt to CB regarding rx and scheduling

## 2020-05-22 NOTE — Telephone Encounter (Signed)
Noted  

## 2020-05-22 NOTE — Telephone Encounter (Signed)
Patient returned call and scheduled office visit for 06/24/20.

## 2020-05-24 ENCOUNTER — Other Ambulatory Visit: Payer: Self-pay | Admitting: Family Medicine

## 2020-05-25 MED ORDER — METFORMIN HCL 500 MG PO TABS: ORAL_TABLET | ORAL | 0 refills | Status: DC

## 2020-05-28 ENCOUNTER — Telehealth: Payer: Self-pay | Admitting: Internal Medicine

## 2020-05-28 NOTE — Telephone Encounter (Signed)
Benjanie with [employer] ph# [phone] called re: Contact listed above states he faxed over forms for a RX for Diabetic Continuous Glucose Monitor on 05/17/20 to fax# [phone] with no response. Requests that forms for the above once signed with most recent office notes, diagnosis and treatment plan be faxed to fax# [phone]. Bensanie states he will refax forms today to fax# [phone].

## 2020-06-06 ENCOUNTER — Other Ambulatory Visit: Payer: Self-pay | Admitting: Family Medicine

## 2020-06-09 NOTE — Telephone Encounter (Signed)
Fax received requesting clinical notes be faxed to [phone]. OV notes routed via Epic

## 2020-06-15 ENCOUNTER — Encounter: Payer: Self-pay | Admitting: Gynecologic Oncology

## 2020-06-16 ENCOUNTER — Other Ambulatory Visit: Payer: Self-pay

## 2020-06-16 ENCOUNTER — Inpatient Hospital Stay: Payer: Medicare Other | Attending: Gynecologic Oncology | Admitting: Gynecologic Oncology

## 2020-06-16 ENCOUNTER — Inpatient Hospital Stay: Payer: Medicare Other

## 2020-06-16 ENCOUNTER — Telehealth: Payer: Self-pay

## 2020-06-16 ENCOUNTER — Telehealth: Payer: Self-pay | Admitting: Internal Medicine

## 2020-06-16 ENCOUNTER — Encounter: Payer: Self-pay | Admitting: Gynecologic Oncology

## 2020-06-16 VITALS — BP 164/56 | HR 93 | Temp 97.3°F | Resp 16 | Ht 61.0 in | Wt 266.3 lb

## 2020-06-16 DIAGNOSIS — Z9079 Acquired absence of other genital organ(s): Secondary | ICD-10-CM | POA: Insufficient documentation

## 2020-06-16 DIAGNOSIS — Z7951 Long term (current) use of inhaled steroids: Secondary | ICD-10-CM | POA: Insufficient documentation

## 2020-06-16 DIAGNOSIS — Z794 Long term (current) use of insulin: Secondary | ICD-10-CM | POA: Insufficient documentation

## 2020-06-16 DIAGNOSIS — Z90722 Acquired absence of ovaries, bilateral: Secondary | ICD-10-CM | POA: Insufficient documentation

## 2020-06-16 DIAGNOSIS — Z79899 Other long term (current) drug therapy: Secondary | ICD-10-CM | POA: Insufficient documentation

## 2020-06-16 DIAGNOSIS — I10 Essential (primary) hypertension: Secondary | ICD-10-CM | POA: Insufficient documentation

## 2020-06-16 DIAGNOSIS — Z90710 Acquired absence of both cervix and uterus: Secondary | ICD-10-CM | POA: Insufficient documentation

## 2020-06-16 DIAGNOSIS — Z8542 Personal history of malignant neoplasm of other parts of uterus: Secondary | ICD-10-CM | POA: Diagnosis not present

## 2020-06-16 DIAGNOSIS — C541 Malignant neoplasm of endometrium: Secondary | ICD-10-CM

## 2020-06-16 DIAGNOSIS — E039 Hypothyroidism, unspecified: Secondary | ICD-10-CM | POA: Insufficient documentation

## 2020-06-16 DIAGNOSIS — E119 Type 2 diabetes mellitus without complications: Secondary | ICD-10-CM | POA: Diagnosis not present

## 2020-06-16 LAB — BASIC METABOLIC PANEL
Anion gap: 10 (ref 5–15)
BUN: 23 mg/dL (ref 8–23)
CO2: 25 mmol/L (ref 22–32)
Calcium: 9.5 mg/dL (ref 8.9–10.3)
Chloride: 104 mmol/L (ref 98–111)
Creatinine, Ser: 0.85 mg/dL (ref 0.44–1.00)
GFR, Estimated: >60 mL/min (ref >60)
Glucose, Bld: 58 mg/dL — ABNORMAL LOW (ref 70–99)
Potassium: 3.7 mmol/L (ref 3.5–5.1)
Sodium: 139 mmol/L (ref 135–145)

## 2020-06-16 NOTE — Progress Notes (Signed)
[facility]   Follow-up Note: New Patient  Consult was initially requested by Dr. Hale Bogus for Grade 1 Endometrial Adenocarcinoma   Chief Complaint  Patient presents with  . Endometrial cancer [facility])     Assessment  Endometrial Cancer: stage IIIA grade 1 endometrioid (MMR normal, MSI stable) s/p staging surgery on 02/19/18. S/p adjuvant carboplatin and paclitaxel x 6 completed April, 2020. S/p adjuvant vaginal brachy completed February, 2020.   No evidence of recurrence.  Lichen sclerosis - if becomes symptomatic, would recommend clobetasol  Plan  Recommend ongoing 6 montly surveillance until April, 2025. Will check CT abd/pelvis before transitioning to 6 monthly surveillance.   HPI: [name]  is a 70 y.o.  P2, - a retired Marine scientist  She had a h/o complex hyperplasia without atypia and was treated with a Mirena IUD x 5 years. ~2017 this was removed and she was noted to have followup biopsies with no residual hyperplasia. Then in July 2019 she had spotting for a few days. This resolved and then she noted another episode early Oct 2019. She presented to Dr. Sabra Heck and a TVUS was perfomed showing a thickened EM stripe with an area showing vascular flow. Therefore an EMB was performed and that revealed: Endometrium, biopsy, grade 1. She was therefore referred to gynecologic oncology for management and recommendations  PCN allergy reviewed - she has used Keflex without issue   Imported EPIC Oncologic History:  Oncology History Overview Note  Endometrioid MSI stable   Endometrial cancer ([place])  01/24/2018 Initial Diagnosis   Patient was seen by GYN for PMB since July 2019.  Pt has hx of complex endometrial hyperplasia without atypica that was treated with Mirena IUD for 5 years.  This was removed about two years ago.  She did have follow up biopsies after placement and did have resolution of hyperplasia.    Patient's last  menstrual period was 08/10/2010.   01/25/2018 Pathology Results   Endometrium, biopsy - ENDOMETRIOID ADENOCARCINOMA, FIGO GRADE 1. - SEE COMMENT.   01/25/2018 Procedure   She had endometrial biopsy in GYN office   01/25/2018 Imaging   US pelvis  UTERUS: 8.5 x 5.3 x 4.5cm EMS: 18-58m, areas of vascular flow noted as well ADNEXA: Left ovary: not seen transvaginally or transabdominally due to habitus                  Right ovary: same as with left ovary CUL DE SAC: no free fluid    02/19/2018 Pathology Results   Uterus +/- tubes/ovaries, neoplastic, cervix - UTERUS: -ENDO/MYOMETRIUM: INVASIVE ENDOMETRIOID ADENOCARCINOMA WITH FOCAL SQUAMOUS DIFFERENTIATION (FIGO GRADE 1), SPANNING 5.8 CM. TUMOR INVADES LESS THAN ONE HALF OF THE MYOMETRIUM. TUMOR INVOLVES CERVICAL STROMA AND BILATERAL FALLOPIAN TUBE LUMEN. SEE ONCOLOGY TABLE. -SEROSA: UNREMARKABLE. NO MALIGNANCY. - CERVIX: STROMAL INVOLVEMENT BY ENDOMETRIOID ADENOCARCINOMA. - BILATERAL OVARIES: INCLUSION CYSTS. NO MALIGNANCY. - BILATERAL FALLOPIAN TUBES: LUMEN INVOLVEMENT BY ENDOMETRIOID ADENOCARCINOMA. Microscopic Comment UTERUS, CARCINOMA OR CARCINOSARCOMA Procedure: Total hysterectomy with bilateral salpingo-oophorectomy. Histologic type: Endometrioid adenocarcinoma with squamous differentiation. Histologic Grade: FIGO grade 1. Myometrial invasion: Depth of invasion: 3 mm Myometrial thickness: 17 mm Uterine Serosa Involvement: Not identified. Cervical stromal involvement: Present. Extent of involvement of other organs: Involves cervical stroma and lumen of bilateral fallopian tubes. Lymphovascular invasion: Not identified. Regional Lymph Nodes: None examined. Tumor block for ancillary studies: 1D-G MMR / MSI testing: Will be ordered.   02/19/2018 Surgery   Surgeon: RDonaciano Eva  Operation: Robotic-assisted laparoscopic total hysterectomy with bilateral salpingoophorectomy (22 modifier for extreme obesity  - see details in body of operative note)   Operative Findings:  : extreme intraperitoneal adiposity which prevented retroperitoneal visualization of the lymph node basins and increased the duration of the procedure by 1 hour (for additional instrumentation, placement of retraction sutures, additional personnel required for assistance.  8cm uterus, grossly normal ovaries, surgically interrupted tubes   03/05/2018 Imaging   1. Upper normal pelvic sidewall lymph nodes bilaterally. Close attention on follow-up recommended. 2. No other findings to suggest metastatic disease in the chest, abdomen, or pelvis. 3.  Aortic Atherosclerois (ICD10-170.0)    03/12/2018 Cancer Staging   Staging form: Corpus Uteri - Carcinoma and Carcinosarcoma, AJCC 8th Edition - Pathologic: Stage III (pT3, pN0, cM0) - Signed by Heath Lark, MD on 03/12/2018   03/16/2018 Procedure   Ultrasound and fluoroscopically guided right internal jugular single lumen power port catheter insertion. Tip in the SVC/RA junction. Catheter ready for use.   03/20/2018 - 07/17/2018 Chemotherapy   The patient had carboplatin and taxol x 6 cycles   04/24/2018 - 05/21/2018 Radiation Therapy   Radiation treatment dates:   04/24/18, 05/03/18, 05/07/18, 05/16/18, 05/21/18  Site/dose:  Vaginal cuff, 6 Gy in 5 fractions for a total dose of 30 Gy  Beams/energy:   HDR Ir-Vaginal, Iridium HDR,patient was treated with a 2.5 cm diameter segmented cylinder, Rxlength of 3 cm, prescription was 6 gray to the mucosal surface    08/16/2018 Imaging   1. Status post hysterectomy. No CT evidence of metastatic disease in the abdomen or pelvis. Unchanged appearance of subcentimeter pelvic sidewall lymph nodes.  2. Other chronic, incidental, and postoperative findings as detailed above.   09/20/2018 Procedure   Removal of implanted Port-A-Cath utilizing sharp and blunt dissection. The procedure was uncomplicated     Measurement of  disease: . TBD  Radiology: . 01/25/18 - TVUS  OSH - UTERUS: 8.5 x 5.3 x 4.5cm EMS: 18-68m, areas of vascular flow noted as well ADNEXA: Left ovary: not seen transvaginally or transabdominally due to habitus Right ovary: same as with left ovary CUL DE SAC: no free fluid . CT chest/abd/pelvis on 03/05/18 (postop) showed upper limit size pelvic nodes, but no other signs of metastatic disease.     Interval Hx:  Since completing chemotherapy in April, 2020 she has had persistent neuropathy, fatigue and vulvar irritation.  Her diabetes remains under modest control. Her most recent HbA1c was 6.6. She was started on Ozempic.   She has been having modest control of diabetes.   Outpatient Encounter Medications as of 06/16/2020  Medication Sig  . albuterol (PROVENTIL) (2.5 MG/3ML) 0.083% nebulizer solution Take 3 mLs (2.5 mg total) by nebulization every 4 (four) hours as needed for wheezing or shortness of breath (Dx: J45.50).  .Marland Kitchenalbuterol (VENTOLIN HFA) 108 (90 Base) MCG/ACT inhaler INHALE 2 PUFFS INTO LUNGS EVERY 6 HOURS AS NEEDED FOR WHEEZING/SHORTNESS OF BREATH  . amitriptyline (ELAVIL) 25 MG tablet TAKE 1 TABLET BY MOUTH EVERYDAY AT BEDTIME  . clobetasol ointment (TEMOVATE) 04.96% Apply 1 application topically at bedtime. Apply to the skin of the vulva for 12 weeks  . Continuous Blood Gluc Receiver (FREESTYLE LIBRE 14 DAY READER) DEVI by Does not apply route.  . diclofenac Sodium (VOLTAREN) 1 % GEL Apply 2 g topically 4 (four) times daily.  . felodipine (PLENDIL) 10 MG 24 hr tablet TAKE 1 TABLET BY MOUTH EVERY DAY  . fluticasone (VERAMYST) 27.5 MCG/SPRAY nasal  spray Place 1 spray into the nose daily as needed for rhinitis.  . furosemide (LASIX) 20 MG tablet TAKE 1 TABLET BY MOUTH EVERY DAY  . HYDROcodone-acetaminophen (NORCO/VICODIN) 5-325 MG tablet 1-2 tabs po bid prn pain  . insulin aspart (NOVOLOG) 100 UNIT/ML injection USE 20-25 units under skin 3x a day before meals  . insulin NPH Human  (NOVOLIN N) 100 UNIT/ML injection Inject 30 units 3x a day under skin  . Insulin Syringes, Disposable, U-100 0.3 ML MISC Use to inject insulin--6 injections per day  . levocetirizine (XYZAL) 5 MG tablet Take 5 mg by mouth at bedtime as needed for allergies.   Marland Kitchen levothyroxine (SYNTHROID) 125 MCG tablet TAKE 1 TABLET BY MOUTH EVERY DAY  . metFORMIN (GLUCOPHAGE) 500 MG tablet TAKE 1 TABLET BY MOUTH EVERY MORNING AND 2 TABLETS BY MOUTH AT BEDTIME. OFFICE VISIT NEEDED  . miconazole (MICOTIN) 2 % powder Apply 1 application topically 2 (two) times daily as needed for itching.  . pravastatin (PRAVACHOL) 40 MG tablet Take 1 tablet (40 mg total) by mouth daily.  . Probiotic Product (PROBIOTIC PO) Take 1 capsule by mouth daily.  . SYMBICORT 160-4.5 MCG/ACT inhaler INHALE 2 PUFFS BY MOUTH TWICE A DAY  . diphenoxylate-atropine (LOMOTIL) 2.5-0.025 MG tablet 1-2 tabs po qid prn diarrhea (Patient not taking: Reported on 06/15/2020)  . fluticasone (CUTIVATE) 0.05 % cream APPLY TO AFFECTED AREA OF R LOWER LEG TWICE PER DAY. (Patient not taking: Reported on 06/15/2020)  . [DISCONTINUED] prochlorperazine (COMPAZINE) 10 MG tablet Take 1 tablet (10 mg total) by mouth every 6 (six) hours as needed (Nausea or vomiting).   No facility-administered encounter medications on file as of 06/16/2020.   Allergies  Allergen Reactions  . Adhesive [Tape] Other (See Comments)    Takes patient's skin off.  . Banana Diarrhea and Nausea Only  . Eggs Or Egg-Derived Products Diarrhea and Nausea Only  . Latex Other (See Comments)    sensativity  . Linzess [Linaclotide] Diarrhea  . Ozempic (0.25 Or 0.5 Mg-Dose) [Semaglutide(0.25 Or 0.64m-Dos)] Diarrhea  . Penicillins Rash and Other (See Comments)    Has had cephalosporins without incident  . Pine Swelling and Rash  . Rose Swelling and Rash    Past Medical History:  Diagnosis Date  . Allergic rhinitis    Allergy testing: results pending as of 05/28/16 (Dr. VHarold Hedge  . Anemia   .  Arthritis   . [place]  . Cough variant asthma   . DDD (degenerative disc disease), lumbar   . Diabetes mellitus with complication (H[facility])    Mild nonprolif DR R eye 12/2017-->referred to retinal specialist DM MANAGED BY DR. GHERGHE AS OF 2021  . [place]ometrial cancer ([place] 02[place]2018   REMISSION AS OF 08/2018.  Stage III (T3, Nx, Mx)  Path: endometroid adenocarcinoma involving cervix, uterus, and fallopian tubes (Pt got TAH & BSO 02/2018). No sign of recurrence as of onc f/u 05/2019.  .Marland KitchenGERD (gastroesophageal reflux disease)   . Hemorrhoids   . Herpes zoster 10/2015   L side belt-line  . History of adenomatous polyp of colon 02/25/13   5 mm cecal polyp removed by Dr. MTrevor Mace5 yrs  . History of blood transfusion   . History of cellulitis 08/2010   Left (Since replacemnt of Left knee  . Hyperkalemia 03/2018   started after pt started getting chemo-->had to stop enalapril.  .Marland KitchenHyperlipidemia    Not on statin b/c lipids "stayed down when sugars came down"  per pt.  She says Dr. Chalmers Cater knows she is not on statin anymore.  . Hypertension   . Hypothyroidism   . Nephrolithiasis 4/07  . Obesity   . OSA on CPAP   . Osteoarthritis    knees, ankle, + ? scapholunate ligament disruption (x-ray 06/2017)--ortho referral.  . Pancytopenia due to antineoplastic chemotherapy ([place])    progressive as of 06/2018. Stable 08/2018.  Marland Kitchen Recurrent UTI    started cipro 250 qd 09/2018  . Sciatica of left side   . Severe persistent asthma    cough-variant--saw Allergist 05/25/16 and was switched from max dose advair to symbicort.  . Systolic murmur    ECHO fine 10/2016-->murmur likely flow murmur assoc with HTN.  Marland Kitchen Zoon's vulvitis    Bx-proven (GYN) -lichen sclerosis.  Clobetasol 0.05% ointment per GYN   Past Surgical History:  Procedure Laterality Date  . ANKLE FRACTURE SURGERY  1984   Pin & repair  . ARTHROSCOPIC REPAIR ACL  2000   Due to ACL tear  . ARTHROSCOPIC REPAIR ACL  5/08   . Blateral knee rerlacements x4 2 times each knee    . BREAST BIOPSY Right 2014   Benign  . CARDIAC CATHETERIZATION  5/07   clear vessel mild mitral   . CARPAL TUNNEL RELEASE Right [id number]   1989 Left  . CESAREAN SECTION  1985  . CHOLECYSTECTOMY OPEN  1988  . COLONOSCOPY N/A 02/25/2013   Tubular adenoma x 1: Recall 5 yrs. Procedure: COLONOSCOPY;  Surgeon: Juanita Craver, MD;  Location: [facility];  Service: Endoscopy;  Laterality: N/A;  . COMBINED HYSTEROSCOPY DIAGNOSTIC / D&C  2/12   Bx neg  . DEXA  08/2007; 05/12/20   Bone density normal 2009 and 2022.  Plan rpt 2024.  Marland Kitchen DILATION AND CURETTAGE OF UTERUS  12/11  . IR IMAGING GUIDED PORT INSERTION  03/16/2018  . IR REMOVAL TUN ACCESS W/ PORT W/O FL MOD SED  09/20/2018  . LYMPH NODE BIOPSY N/A 02/19/2018   Procedure: Sentinel LYMPH NODE BIOPSY;  Surgeon: Everitt Amber, MD;  Location: [facility];  Service: Gynecology;  Laterality: N/A;  . REPLACEMENT TOTAL KNEE Left 5/12   X 2 on each  . ROBOTIC ASSISTED TOTAL HYSTERECTOMY WITH BILATERAL SALPINGO OOPHERECTOMY N/A 02/19/2018   For endometrial cancer.  Procedure: XI ROBOTIC ASSISTED TOTAL HYSTERECTOMY WITH BILATERAL SALPINGO OOPHORECTOMY;  Surgeon: Everitt Amber, MD;  Location: [facility];  Service: Gynecology;  Laterality: N/A;  . TRANSTHORACIC ECHOCARDIOGRAM  10/11/2016    EF 55-60%, grd I DD.  . TUBAL LIGATION  1985   C-Section        Past Gynecological History:   GYNECOLOGIC HISTORY:  . Patient's last menstrual period was 08/10/2010.  . Menarche: 70 years old . P 2 . Contraceptive h/o Mirena IUD . HRT None  . Last Pap 10/2016 negative Family Hx:  Family History  Problem Relation Age of Onset  . Diabetes Father   . COPD Father   . Stroke Father   . Celiac disease Brother   . Congenital heart disease Brother   . Rheum arthritis Brother   . Thyroid disease Brother   . Diabetes Brother   . Dementia Brother        Lewy Body  . Alzheimer's  disease Mother   . Arthritis Mother   . Other Son        Died age 49 -Tetrology of Fallot - VSD/pulmonary atresia  . Breast cancer Other  postmenopausal when diagnosed   Social Hx:  Marland Kitchen Tobacco use: none . Alcohol use: none . Illicit Drug use: none . Illicit IV Drug use: none    Review of Systems: Review of Systems  Constitutional: Positive for fatigue.  Genitourinary:        Vulvodynia  Neurological: Positive for numbness.       Foot neuropathy pain  All other systems reviewed and are negative.   Vitals:  Vitals:   06/16/20 1337  BP: (!) 164/56  Pulse: 93  Resp: 16  Temp: (!) 97.3 F (36.3 C)  SpO2: 97%   Vitals:   06/16/20 1337  Weight: 266 lb 4.8 oz (120.8 kg)  Height: '5\' 1"'  (1.549 m)   Body mass index is 50.32 kg/m.  Physical Exam: General :  Obese. Well developed, 70 y.o., female in no apparent distress HEENT:  Normocephalic/atraumatic, symmetric, EOMI, eyelids normal Neck:   Supple, no masses.  Lymphatics:  No cervical/ submandibular/ supraclavicular/ infraclavicular/ inguinal adenopathy Respiratory:  Respirations unlabored, no use of accessory muscles CV:   Deferred Breast:  Deferred Musculoskeletal: No CVA tenderness, normal muscle strength. Abdomen:  Obese with pannus. Soft, non-tender and nondistended. No evidence of hernia. No masses. Extremities:  No lymphedema, no erythema, non-tender. Skin:   Normal inspection; candida at mons/groin Neuro/Psych:  No focal motor deficit, no abnormal mental status. Normal gait. Normal affect. Alert and oriented to person, place, and time  Genito Urinary: vaginal cuff in tact no lesions or masses. White changes to butterfly distribution consistent with lichen sclerosis. There are ulcerated disc lesions on the right and left labia consistent with pressure sites. No surrounding erythema/induration.   Cc: M. Edwinna Areola, MD (Referring Ob/Gyn) Anitra Lauth Adrian Blackwater, MD (PCP)

## 2020-06-16 NOTE — Telephone Encounter (Signed)
Prescription form received. Given to Dr for completion.

## 2020-06-16 NOTE — Telephone Encounter (Signed)
Spoke with [employer]. Told her that her BS was 78 when she was here at visit. [name] states that she had not eaten lunch yet.  She felt that her BS was getting lower. She has the [place] free style. Her reading was 53.  They are at a restaurant and she is drinking a regular soft drink. Pt appreciated the call.

## 2020-06-16 NOTE — Telephone Encounter (Signed)
[employer] has faxed over a prescription for pt's continuous glucose monitor. They ask that it please be completed and sent in.

## 2020-06-16 NOTE — Patient Instructions (Signed)
Dr Denman George is checking a CT scan to evaluate for any evidence of cancer. If negative, she will recommend following up with Dr Sondra Come in 6 months and Dr Denman George in 12 months.  Please have Dr Clabe Seal office contact Dr Serita Grit office (at [phone]) in September after your appointment with him to request an appointment with Dr Denman George for March, 2023.

## 2020-06-17 NOTE — Telephone Encounter (Signed)
Opened in error

## 2020-06-18 ENCOUNTER — Other Ambulatory Visit: Payer: Self-pay | Admitting: Family Medicine

## 2020-06-24 ENCOUNTER — Ambulatory Visit: Payer: Medicare Other | Admitting: Family Medicine

## 2020-06-25 ENCOUNTER — Encounter (HOSPITAL_COMMUNITY): Payer: Self-pay

## 2020-06-25 ENCOUNTER — Ambulatory Visit (HOSPITAL_COMMUNITY)
Admission: RE | Admit: 2020-06-25 | Discharge: 2020-06-25 | Disposition: A | Discharge status: Home / Self Care | Payer: Medicare Other | Source: Ambulatory Visit | Attending: Gynecologic Oncology | Admitting: Gynecologic Oncology

## 2020-06-25 ENCOUNTER — Other Ambulatory Visit: Payer: Self-pay

## 2020-06-25 DIAGNOSIS — I700 Atherosclerosis of aorta: Secondary | ICD-10-CM | POA: Diagnosis not present

## 2020-06-25 DIAGNOSIS — C541 Malignant neoplasm of endometrium: Secondary | ICD-10-CM | POA: Insufficient documentation

## 2020-06-25 DIAGNOSIS — R160 Hepatomegaly, not elsewhere classified: Secondary | ICD-10-CM | POA: Diagnosis not present

## 2020-06-25 DIAGNOSIS — N3090 Cystitis, unspecified without hematuria: Secondary | ICD-10-CM | POA: Diagnosis not present

## 2020-06-25 MED ORDER — IOHEXOL 300 MG/ML  SOLN
100.0000 mL | Freq: Once | INTRAMUSCULAR | Status: AC | PRN
  Administered 2020-06-25: 100 mL via INTRAVENOUS

## 2020-06-29 ENCOUNTER — Telehealth: Payer: Self-pay

## 2020-06-29 NOTE — Telephone Encounter (Addendum)
Told Ms Coale the results of the CT scan showing no evidence of metastatic disease in the abdomen or pelvis. Pt denies any urinary symptoms as a tiny gas bubble was identified in the bladder Lumen. Enlarge liver, diverticular changes noted in left colon with out evidence of diverticulitis,and abdominal aortic atherosclerosis without aneurysm. Told Ms Crusoe that she can see Dr. Sondra Come in 6 months from 06-16-20 appointment with Dr. Denman George which would be September of 2022.  She can call his office and r/s 08-10-20 appointment to September. Call Dr. Serita Grit office in January of 2023 to schedule a follow up appointment for March of 2023 per Melissa Cross,NP. A copy of her CT scan from 06-25-20 was sent to her PCP's in basket in epic for his review. Pt verbalized understanding.

## 2020-06-30 ENCOUNTER — Other Ambulatory Visit: Payer: Self-pay

## 2020-06-30 ENCOUNTER — Ambulatory Visit (INDEPENDENT_AMBULATORY_CARE_PROVIDER_SITE_OTHER): Payer: Medicare Other | Admitting: Internal Medicine

## 2020-06-30 ENCOUNTER — Encounter: Payer: Self-pay | Admitting: Internal Medicine

## 2020-06-30 VITALS — BP 138/78 | HR 91 | Ht 61.0 in | Wt 264.4 lb

## 2020-06-30 DIAGNOSIS — IMO0002 Reserved for concepts with insufficient information to code with codable children: Secondary | ICD-10-CM

## 2020-06-30 DIAGNOSIS — E1165 Type 2 diabetes mellitus with hyperglycemia: Secondary | ICD-10-CM

## 2020-06-30 DIAGNOSIS — Z860100 Personal history of colon polyps, unspecified: Secondary | ICD-10-CM | POA: Insufficient documentation

## 2020-06-30 DIAGNOSIS — E785 Hyperlipidemia, unspecified: Secondary | ICD-10-CM | POA: Diagnosis not present

## 2020-06-30 DIAGNOSIS — R1310 Dysphagia, unspecified: Secondary | ICD-10-CM | POA: Insufficient documentation

## 2020-06-30 DIAGNOSIS — K5904 Chronic idiopathic constipation: Secondary | ICD-10-CM | POA: Insufficient documentation

## 2020-06-30 DIAGNOSIS — E11319 Type 2 diabetes mellitus with unspecified diabetic retinopathy without macular edema: Secondary | ICD-10-CM | POA: Diagnosis not present

## 2020-06-30 MED ORDER — METFORMIN HCL 500 MG PO TABS
ORAL_TABLET | ORAL | 3 refills | Status: DC
Start: 2020-06-30 — End: 2021-06-21

## 2020-06-30 NOTE — Progress Notes (Signed)
Patient ID: [name], female   DOB: 1950/11/10, 70 y.o.   MRN: [id number]   This visit occurred during the SARS-CoV-2 public health emergency.  Safety protocols were in place, including screening questions prior to the visit, additional usage of staff PPE, and extensive cleaning of exam room while observing appropriate contact time as indicated for disinfecting solutions.   HPI: [name] is a 70 y.o.-year-old female, initially referred by her PCP, Dr. Ernestine Conrad, returning for follow-up for DM2, dx in 1992, insulin-dependent since 2004-2005, uncontrolled, with complications (DR).  Last visit 1.5 months ago.  She is here with her husband who offers part of the history especially related to her past medical history, medications, and activity. On [facility].  Interim history: At our last visit, she was grieving for her brother who died from Lewy body dementia.  Her sugars were higher Since last visit, she started on the CGM -she uses the skin tac solution that allow her to tolerate the adhesive.  She is very happy with this. She does not have any complaints at this visit.  Reviewed HbA1c levels: Lab Results  Component Value Date   HGBA1C 9.1 (A) 05/15/2020   HGBA1C 7.7 (A) 12/26/2019   HGBA1C 6.6 (A) 05/07/2019   HGBA1C 8.8 (H) 01/21/2019   HGBA1C 7.5 (H) 10/05/2018   HGBA1C 7.0 (A) 05/18/2018   HGBA1C 7.1 (H) 02/15/2018   HGBA1C 7.3 (A) 01/03/2018   HGBA1C 9.4 (H) 10/04/2017   HGBA1C 8.7 (H) 06/28/2017   Previously on: - Metformin (tablets) 500 mg in am and 1000 mg with dinner - NPH 52 units 3x a day - Novolog 12 (-18) units 3x a day, before meals She was on Lantus >> expensive.  We changed to: - Metformin 500 mg in am and 1000 mg with dinner - NPH 30 >> 40 units 3x a day >> 30 units 3x a day - Novolog 10-14 >> 17-20 units >> 20-25 units 3x a day, before meals Tried Ozempic 0.5 mg weekly >> significant diarrhea, abd. Pain, food intolerance >> started 04/2019 -stopped  07/2019  She was not checking sugars at all at last visit.  Now checking more than 4 times a day with her CGM:   Previously: - am: 80-140 >> 120-140 - 2h after b'fast: n/c - before lunch: n/c >> 120-140 - 2h after lunch: n/c - before dinner: n/c - 2h after dinner: n/c - bedtime: 50s, 115-120, 200 >> <140 - nighttime: n/c Lowest sugar was 50 >> 65-70 (more active) >> 50s; she has hypoglycemia awareness in the 70s.  Highest sugar was 300 - pizza >> 200 >> 260s.  Glucometer: [facility]  Pt's meals are: - Breakfast: oatmeal - instant, peacans, butter >> sausage, bacon, apple sauce, velveeta crackers - Lunch: tortilla + PB and J or [place] and cheese >> yoghurt, apple sauce, half a sandwich - Dinner: meat (hamburger, chicken, bratwurst), spaghetti, green beans, mixed veggies - Snacks:   -No CKD, last BUN/creatinine:  Lab Results  Component Value Date   BUN 23 06/16/2020   BUN 25 (H) 12/18/2019   CREATININE 0.85 06/16/2020   CREATININE 0.84 12/18/2019   -+ HL; last set of lipids: Lab Results  Component Value Date   CHOL 172 12/18/2019   HDL 39.60 12/18/2019   LDLCALC 99 12/18/2019   LDLDIRECT 78.0 10/05/2018   TRIG 164.0 (H) 12/18/2019   CHOLHDL 4 12/18/2019  On pravastatin 20.  - last eye exam was in 06/2017: + DR  -+ numbness  and tingling in her feet -chemotherapy related.  Pt has FH of DM in daughter, brother, father, P aunts.  She also has a history of endometrial cancer - 2019, also, OSA, chemotherapy-induced peripheral neuropathy, hypothyroidism-on levothyroxine 125 mcg daily.  Latest TSH was normal: Lab Results  Component Value Date   TSH 2.88 12/18/2019   She lost a child at 35 y/o in 1990 >> she gained 100 lbs and was dx'ed with DM afterwards.  No h/o pancreatitis or FH of MTC.  ROS: Constitutional: no weight gain/no weight loss, no fatigue, no subjective hyperthermia, no subjective hypothermia Eyes: no blurry vision, no xerophthalmia ENT: no sore throat, no  nodules palpated in neck, no dysphagia, no odynophagia, no hoarseness Cardiovascular: no CP/no SOB/no palpitations/no leg swelling Respiratory: no cough/no SOB/no wheezing Gastrointestinal: no N/no V/no D/no C/no acid reflux Musculoskeletal: no muscle aches/no joint aches Skin: no rashes, no hair loss Neurological: no tremors/+ numbness/+ tingling/no dizziness  I reviewed pt's medications, allergies, PMH, social hx, family hx, and changes were documented in the history of present illness. Otherwise, unchanged from my initial visit note.  Past Medical History:  Diagnosis Date  . Allergic rhinitis    Allergy testing: results pending as of 05/28/16 (Dr. Harold Hedge)  . Anemia   . Arthritis   . Chemotherapy-induced neuropathy ([place]) 2019  . Cough variant asthma   . DDD (degenerative disc disease), lumbar   . Diabetes mellitus with complication (HCC)    Mild nonprolif DR R eye 12/2017-->referred to retinal specialist DM MANAGED BY DR. Weston Fulco AS OF 2021  . Endometrial cancer ([place]) 02/2018   REMISSION AS OF 08/2018.  Stage III (T3, Nx, Mx)  Path: endometroid adenocarcinoma involving cervix, uterus, and fallopian tubes (Pt got TAH & BSO 02/2018). No sign of recurrence as of onc f/u 05/2019.  Marland Kitchen GERD (gastroesophageal reflux disease)   . Hemorrhoids   . Herpes zoster 10/2015   L side belt-line  . History of adenomatous polyp of colon 02/25/13   5 mm cecal polyp removed by Dr. Trevor Mace 5 yrs  . History of blood transfusion   . History of cellulitis 08/2010   Left (Since replacemnt of Left knee  . Hyperkalemia 03/2018   started after pt started getting chemo-->had to stop enalapril.  Marland Kitchen Hyperlipidemia    Not on statin b/c lipids "stayed down when sugars came down" per pt.  She says Dr. Chalmers Cater knows she is not on statin anymore.  . Hypertension   . Hypothyroidism   . Nephrolithiasis 4/07  . Obesity   . OSA on CPAP   . Osteoarthritis    knees, ankle, + ? scapholunate ligament disruption  (x-ray 06/2017)--ortho referral.  . Pancytopenia due to antineoplastic chemotherapy ([place])    progressive as of 06/2018. Stable 08/2018.  Marland Kitchen Recurrent UTI    started cipro 250 qd 09/2018  . Sciatica of left side   . Severe persistent asthma    cough-variant--saw Allergist 05/25/16 and was switched from max dose advair to symbicort.  . Systolic murmur    ECHO fine 10/2016-->murmur likely flow murmur assoc with HTN.  Marland Kitchen Zoon's vulvitis    Bx-proven (GYN) -lichen sclerosis.  Clobetasol 0.05% ointment per GYN   Past Surgical History:  Procedure Laterality Date  . ANKLE FRACTURE SURGERY  1984   Pin & repair  . ARTHROSCOPIC REPAIR ACL  2000   Due to ACL tear  . ARTHROSCOPIC REPAIR ACL  5/08  . Blateral knee rerlacements x4 2 times each knee    .  BREAST BIOPSY Right 2014   Benign  . CARDIAC CATHETERIZATION  5/07   clear vessel mild mitral   . CARPAL TUNNEL RELEASE Right [id number]   1989 Left  . CESAREAN SECTION  1985  . CHOLECYSTECTOMY OPEN  1988  . COLONOSCOPY N/A 02/25/2013   Tubular adenoma x 1: Recall 5 yrs. Procedure: COLONOSCOPY;  Surgeon: Juanita Craver, MD;  Location: [facility];  Service: Endoscopy;  Laterality: N/A;  . COMBINED HYSTEROSCOPY DIAGNOSTIC / D&C  2/12   Bx neg  . DEXA  08/2007; 05/12/20   Bone density normal 2009 and 2022.  Plan rpt 2024.  Marland Kitchen DILATION AND CURETTAGE OF UTERUS  12/11  . IR IMAGING GUIDED PORT INSERTION  03/16/2018  . IR REMOVAL TUN ACCESS W/ PORT W/O FL MOD SED  09/20/2018  . LYMPH NODE BIOPSY N/A 02/19/2018   Procedure: Sentinel LYMPH NODE BIOPSY;  Surgeon: Everitt Amber, MD;  Location: [facility];  Service: Gynecology;  Laterality: N/A;  . REPLACEMENT TOTAL KNEE Left 5/12   X 2 on each  . ROBOTIC ASSISTED TOTAL HYSTERECTOMY WITH BILATERAL SALPINGO OOPHERECTOMY N/A 02/19/2018   For endometrial cancer.  Procedure: XI ROBOTIC ASSISTED TOTAL HYSTERECTOMY WITH BILATERAL SALPINGO OOPHORECTOMY;  Surgeon: Everitt Amber, MD;  Location: [place];  Service: Gynecology;  Laterality: N/A;  . TRANSTHORACIC ECHOCARDIOGRAM  10/11/2016    EF 55-60%, grd I DD.  . TUBAL LIGATION  1985   C-Section   Social History   Socioeconomic History  . Marital status: Married    Spouse name: Louie Casa  . Number of children: 2  . Years of education: Not on file  . Highest education level: Not on file  Occupational History  . Occupation: retired Therapist, sports  Tobacco Use  . Smoking status: Never Smoker  . Smokeless tobacco: Never Used  Vaping Use  . Vaping Use: Never used  Substance and Sexual Activity  . Alcohol use: No    Alcohol/week: 0.0 standard drinks  . Drug use: No  . Sexual activity: Never    Partners: Male    Birth control/protection: I.U.D.  Other Topics Concern  . Not on file  Social History Narrative  . Not on file   Social Determinants of Health   Financial Resource Strain: Low Risk   . Difficulty of Paying Living Expenses: Not hard at all  Food Insecurity: No Food Insecurity  . Worried About Charity fundraiser in the Last Year: Never true  . Ran Out of Food in the Last Year: Never true  Transportation Needs: No Transportation Needs  . Lack of Transportation (Medical): No  . Lack of Transportation (Non-Medical): No  Physical Activity: Insufficiently Active  . Days of Exercise per Week: 4 days  . Minutes of Exercise per Session: 20 min  Stress: No Stress Concern Present  . Feeling of Stress : Not at all  Social Connections: Moderately Isolated  . Frequency of Communication with Friends and Family: More than three times a week  . Frequency of Social Gatherings with Friends and Family: Once a week  . Attends Religious Services: Never  . Active Member of Clubs or Organizations: No  . Attends Archivist Meetings: Never  . Marital Status: Married  Human resources officer Violence: Not At Risk  . Fear of Current or Ex-Partner: No  . Emotionally Abused: No  . Physically Abused: No  . Sexually Abused: No    Current Outpatient Medications on File Prior to Visit  Medication Sig Dispense  Refill  . albuterol (PROVENTIL) (2.5 MG/3ML) 0.083% nebulizer solution Take 3 mLs (2.5 mg total) by nebulization every 4 (four) hours as needed for wheezing or shortness of breath (Dx: J45.50). 75 mL 1  . albuterol (VENTOLIN HFA) 108 (90 Base) MCG/ACT inhaler INHALE 2 PUFFS INTO LUNGS EVERY 6 HOURS AS NEEDED FOR WHEEZING/SHORTNESS OF BREATH 18 each 1  . amitriptyline (ELAVIL) 25 MG tablet TAKE 1 TABLET BY MOUTH EVERYDAY AT BEDTIME 90 tablet 3  . clobetasol ointment (TEMOVATE) 5.09 % Apply 1 application topically at bedtime. Apply to the skin of the vulva for 12 weeks 30 g 2  . Continuous Blood Gluc Receiver (FREESTYLE LIBRE 14 DAY READER) DEVI by Does not apply route.    . diclofenac Sodium (VOLTAREN) 1 % GEL Apply 2 g topically 4 (four) times daily. 100 g 3  . diphenoxylate-atropine (LOMOTIL) 2.5-0.025 MG tablet 1-2 tabs po qid prn diarrhea (Patient not taking: Reported on 06/15/2020) 30 tablet 2  . felodipine (PLENDIL) 10 MG 24 hr tablet TAKE 1 TABLET BY MOUTH EVERY DAY 30 tablet 0  . fluticasone (CUTIVATE) 0.05 % cream APPLY TO AFFECTED AREA OF R LOWER LEG TWICE PER DAY. (Patient not taking: Reported on 06/15/2020) 30 g 0  . fluticasone (VERAMYST) 27.5 MCG/SPRAY nasal spray Place 1 spray into the nose daily as needed for rhinitis.    . furosemide (LASIX) 20 MG tablet TAKE 1 TABLET BY MOUTH EVERY DAY 30 tablet 0  . HYDROcodone-acetaminophen (NORCO/VICODIN) 5-325 MG tablet 1-2 tabs po bid prn pain 90 tablet 0  . insulin aspart (NOVOLOG) 100 UNIT/ML injection USE 20-25 units under skin 3x a day before meals 90 mL 3  . insulin NPH Human (NOVOLIN N) 100 UNIT/ML injection Inject 30 units 3x a day under skin 50 mL 3  . Insulin Syringes, Disposable, U-100 0.3 ML MISC Use to inject insulin--6 injections per day 540 each 3  . levocetirizine (XYZAL) 5 MG tablet Take 5 mg by mouth at bedtime as needed for allergies.   5  .  levothyroxine (SYNTHROID) 125 MCG tablet TAKE 1 TABLET BY MOUTH EVERY DAY 90 tablet 2  . metFORMIN (GLUCOPHAGE) 500 MG tablet TAKE 1 TABLET BY MOUTH EVERY MORNING AND 2 TABLETS BY MOUTH AT BEDTIME. OFFICE VISIT NEEDED 90 tablet 0  . miconazole (MICOTIN) 2 % powder Apply 1 application topically 2 (two) times daily as needed for itching.    . pravastatin (PRAVACHOL) 40 MG tablet Take 1 tablet (40 mg total) by mouth daily. 90 tablet 1  . Probiotic Product (PROBIOTIC PO) Take 1 capsule by mouth daily.    . SYMBICORT 160-4.5 MCG/ACT inhaler INHALE 2 PUFFS BY MOUTH TWICE A DAY 30.6 each 1  . [DISCONTINUED] prochlorperazine (COMPAZINE) 10 MG tablet Take 1 tablet (10 mg total) by mouth every 6 (six) hours as needed (Nausea or vomiting). 30 tablet 1   No current facility-administered medications on file prior to visit.   Allergies  Allergen Reactions  . Adhesive [Tape] Other (See Comments)    Takes patient's skin off.  . Banana Diarrhea and Nausea Only  . Eggs Or Egg-Derived Products Diarrhea and Nausea Only  . Latex Other (See Comments)    sensativity  . Linzess [Linaclotide] Diarrhea  . Ozempic (0.25 Or 0.5 Mg-Dose) [Semaglutide(0.25 Or 0.5mg -Dos)] Diarrhea  . Penicillins Rash and Other (See Comments)    Has had cephalosporins without incident  . Pine Swelling and Rash  . Rose Swelling and Rash   Family History  Problem  Relation Age of Onset  . Diabetes Father   . COPD Father   . Stroke Father   . Celiac disease Brother   . Congenital heart disease Brother   . Rheum arthritis Brother   . Thyroid disease Brother   . Diabetes Brother   . Dementia Brother        Lewy Body  . Alzheimer's disease Mother   . Arthritis Mother   . Other Son        Died age 72 -Tetrology of Fallot - VSD/pulmonary atresia  . Breast cancer Other        postmenopausal when diagnosed   PE: BP 138/78 (BP Location: Right Arm, Patient Position: Sitting, Cuff Size: Large)   Pulse 91   Ht 5\' 1"  (1.549 m)   Wt  264 lb 6.4 oz (119.9 kg)   LMP 08/10/2010 Comment: Hysterectomy 02/02/18  SpO2 95%   BMI 49.96 kg/m  Wt Readings from Last 3 Encounters:  06/30/20 264 lb 6.4 oz (119.9 kg)  06/16/20 266 lb 4.8 oz (120.8 kg)  05/15/20 266 lb 6.4 oz (120.8 kg)   Constitutional: overweight, in NAD, in wheelchair Eyes: PERRLA, EOMI, no exophthalmos ENT: moist mucous membranes, no thyromegaly, no cervical lymphadenopathy Cardiovascular: RRR, No MRG, + bilateral LE pitting edema Respiratory: CTA B Gastrointestinal: abdomen soft, NT, ND, BS+ Musculoskeletal: no deformities, strength intact in all 4 Skin: moist, warm, no rashes Neurological: no tremor with outstretched hands, DTR normal in all 4  ASSESSMENT: 1. DM2, insulin-dependent, uncontrolled, with complications - mild NP DR OD  2. HL  3.  Obesity class III  PLAN:  1. Patient with longstanding, uncontrolled, type 2 diabetes, on Metformin and basal/bolus insulin regimen, with still poor control.  At last visit HbA1c was 9.1%.  She was not checking sugars so we could not make changes in her regimen.  I recommended a freestyle libre CGM as she was telling me that this is covered by her insurance.  Of note, in the past she could not tolerate Ozempic due to GI side effects. CGM interpretation: -At today's visit, we reviewed her CGM downloads: It appears that 76% of values are in target range (goal >70%), while 22% are higher than 180 (goal <25%), and 2% are lower than 70 (goal <4%).  The calculated average blood sugar is 145.  The projected HbA1c for the next 3 months (GMI) is 6.8%. -Reviewing the CGM trends, it appears that the majority of her blood sugars are in target range, however, several distinctive trends are obvious: Her sugars increase after breakfast (we increased her NovoLog before this meal), then decrease abruptly after lunch (we decreased NovoLog before lunch and also NPH in the morning, which can peak in the early afternoon), the sugars also  increase after dinner, but there is more variability here and the increases are not as significant as with breakfast (we will continue the same dose of NovoLog at night).  However, sugars increased overnight after an initial decrease before midnight (so I advised her to increase her NPH in the evening). -We will continue Metformin at the current dose for now. - I suggested to:  Patient Instructions   Patient Instructions  Please continue: - Metformin 500 mg in am and 1000 mg with dinner  Please change: Insulin Before breakfast Before lunch Before dinner  Novolog 25 >> 28 25 >> 15  25  NPH 30 >> 25 30 30  >> 35   Please come back for a follow-up appointment in 2-3  months.  - advised to check sugars at different times of the day - 4x a day, rotating check times - advised for yearly eye exams >> she is UTD - return to clinic in 3 months  2. HL -Reviewed latest lipid panel from 12/2019: LDL higher, above target (less than 70), triglycerides slightly high: Lab Results  Component Value Date   CHOL 172 12/18/2019   HDL 39.60 12/18/2019   LDLCALC 99 12/18/2019   LDLDIRECT 78.0 10/05/2018   TRIG 164.0 (H) 12/18/2019   CHOLHDL 4 12/18/2019  -Continues pravastatin 20 mg daily without side effects  3.  Obesity class III -Lost 6 pounds before last visit and 2 more since last visit -Unfortunately she could not tolerate Ozempic which would have helped with weight loss  Philemon Kingdom, MD PhD [facility] Endocrinology

## 2020-06-30 NOTE — Patient Instructions (Addendum)
Patient Instructions  Please continue: - Metformin 500 mg in am and 1000 mg with dinner  Please change: Insulin Before breakfast Before lunch Before dinner  Novolog 25 >> 28 25 >> 15  25  NPH 30 >> 25 30 30  >> 35   Please come back for a follow-up appointment in 2-3 months.

## 2020-07-01 ENCOUNTER — Encounter: Payer: Self-pay | Admitting: Family Medicine

## 2020-07-01 ENCOUNTER — Ambulatory Visit (INDEPENDENT_AMBULATORY_CARE_PROVIDER_SITE_OTHER): Payer: Medicare Other | Admitting: Family Medicine

## 2020-07-01 VITALS — BP 143/69 | HR 79 | Temp 98.0°F | Resp 16 | Ht 61.0 in | Wt 265.4 lb

## 2020-07-01 DIAGNOSIS — G894 Chronic pain syndrome: Secondary | ICD-10-CM | POA: Diagnosis not present

## 2020-07-01 DIAGNOSIS — J4550 Severe persistent asthma, uncomplicated: Secondary | ICD-10-CM | POA: Diagnosis not present

## 2020-07-01 DIAGNOSIS — E785 Hyperlipidemia, unspecified: Secondary | ICD-10-CM

## 2020-07-01 DIAGNOSIS — E119 Type 2 diabetes mellitus without complications: Secondary | ICD-10-CM

## 2020-07-01 DIAGNOSIS — E039 Hypothyroidism, unspecified: Secondary | ICD-10-CM | POA: Diagnosis not present

## 2020-07-01 DIAGNOSIS — Z86010 Personal history of colonic polyps: Secondary | ICD-10-CM

## 2020-07-01 DIAGNOSIS — I10 Essential (primary) hypertension: Secondary | ICD-10-CM

## 2020-07-01 MED ORDER — SYMBICORT 160-4.5 MCG/ACT IN AERO: INHALATION_SPRAY | RESPIRATORY_TRACT | 3 refills | Status: DC

## 2020-07-01 MED ORDER — HYDROCODONE-ACETAMINOPHEN 5-325 MG PO TABS: ORAL_TABLET | ORAL | 0 refills | Status: DC

## 2020-07-01 MED ORDER — AMITRIPTYLINE HCL 25 MG PO TABS
ORAL_TABLET | ORAL | 3 refills | Status: DC
Start: 2020-07-01 — End: 2021-07-19

## 2020-07-01 NOTE — Progress Notes (Signed)
OFFICE VISIT  07/01/2020  CC:  Chief Complaint  Patient presents with  . Follow-up    Chronic pain syndrome   HPI:    Patient is a 70 y.o. Caucasian female who presents for 6 mo f/u chronic back pain, HTN, HLD, hypothyroidism. Has DM 2, managed by Dr. Cruzita Lederer in endocrinology. A/P as of last visit with me 6 mo ago: "1) Chronic pain syndrome: Lumbar DDD/spondylosis with bilat sciatica pain. Pain gradually worsening.  She is hesitant to take enough vicodin to make a signif difference except for helping with pain enough to initiate/maintain sleep. Did not tolerate gabapentin.  Ordered PT referral and will start amitriptyline 25mg  qhs today.  Therapeutic expectations and side effect profile of medication discussed today.  Patient's questions answered.  Continue voltaren gel for back since this helps.  Occ aleve or ibup ok.  No more meloxicam b/c no help. Renew CSC and get UDS today (most recent vicodin was last night).  If not improved in 2 mo then refer to pain mgmt specialist for consideration of spinal stimulator trial OR Dr. Maia Petties or Ramos for consideration of ESI.  2) HTN: erratic control due to uncontrolled pain. No changes today: felodipine 10mg  qd."  INTERIM HX: Feels stable.  No side effects from amitriptyline and thinks maybe it helps a little.  Takes 2 vicodin nightly, occ a daytime dose.  Only occ dose of ibup when bad weather front comes through.  No constipation from pain med.   Indication for chronic opioid:osteoarthritisin hands,low back, feet, L hip, knees, +chemotherapy -induced neuropathy (burning and numbness in feet). Medication and dose:Vicodin 5/325, 1-2 bid prn # pills per month: 90. PMP AWARE reviewed today: most recent rx for vicodin 5/325 was filled 05/20/20, # 63, rx by me. No red flags.  HLD: we increased pravastatin to 40mg  qd 6 mo ago when LDL was 99 (goal 70).  HTN: home bp typically <130/80  Hypoth: Takes 125 mcg T4 on empty stomach w/out any  other meds.  Last GYN/onc surveillance 06/2020 was all clear and plans for rpt Sept 2022.  ROS as above, plus--> no fevers, no CP, no SOB, no wheezing, no cough, no dizziness, no HAs, no rashes, no melena/hematochezia.  No polyuria or polydipsia.  No myalgias or arthralgias.  No focal weakness, paresthesias, or tremors.  No acute vision or hearing abnormalities.  No dysuria or unusual/new urinary urgency or frequency.  No recent changes in lower legs. No n/v or abd pain.  No palpitations.    Past Medical History:  Diagnosis Date  . Allergic rhinitis    Allergy testing: results pending as of 05/28/16 (Dr. Harold Hedge)  . Anemia   . Arthritis   . Chemotherapy-induced neuropathy ([place]) 2019  . Cough variant asthma   . DDD (degenerative disc disease), lumbar   . Diabetes mellitus with complication (HCC)    Mild nonprolif DR R eye 12/2017-->referred to retinal specialist DM MANAGED BY DR. GHERGHE AS OF 2021  . Endometrial cancer ([place]) 02/2018   REMISSION AS OF 08/2018.  Stage III (T3, Nx, Mx)  Path: endometroid adenocarcinoma involving cervix, uterus, and fallopian tubes (Pt got TAH & BSO 02/2018). No sign of recurrence as of onc f/u 05/2019.  Marland Kitchen GERD (gastroesophageal reflux disease)   . Hemorrhoids   . Herpes zoster 10/2015   L side belt-line  . History of adenomatous polyp of colon 02/25/13   5 mm cecal polyp removed by Dr. Trevor Mace 5 yrs  . History of blood transfusion   .  History of cellulitis 08/2010   Left (Since replacemnt of Left knee  . Hyperkalemia 03/2018   started after pt started getting chemo-->had to stop enalapril.  Marland Kitchen Hyperlipidemia    Not on statin b/c lipids "stayed down when sugars came down" per pt.  She says Dr. Chalmers Cater knows she is not on statin anymore.  . Hypertension   . Hypothyroidism   . Nephrolithiasis 4/07  . Obesity   . OSA on CPAP   . Osteoarthritis    knees, ankle, + ? scapholunate ligament disruption (x-ray 06/2017)--ortho referral.  . Pancytopenia due to  antineoplastic chemotherapy ([place])    progressive as of 06/2018. Stable 08/2018.  Marland Kitchen Recurrent UTI    started cipro 250 qd 09/2018  . Sciatica of left side   . Severe persistent asthma    cough-variant--saw Allergist 05/25/16 and was switched from max dose advair to symbicort.  . Systolic murmur    ECHO fine 10/2016-->murmur likely flow murmur assoc with HTN.  Marland Kitchen Zoon's vulvitis    Bx-proven (GYN) -lichen sclerosis.  Clobetasol 0.05% ointment per GYN    Past Surgical History:  Procedure Laterality Date  . ANKLE FRACTURE SURGERY  1984   Pin & repair  . ARTHROSCOPIC REPAIR ACL  2000   Due to ACL tear  . ARTHROSCOPIC REPAIR ACL  5/08  . Blateral knee rerlacements x4 2 times each knee    . BREAST BIOPSY Right 2014   Benign  . CARDIAC CATHETERIZATION  5/07   clear vessel mild mitral   . CARPAL TUNNEL RELEASE Right [id number]   1989 Left  . CESAREAN SECTION  1985  . CHOLECYSTECTOMY OPEN  1988  . COLONOSCOPY N/A 02/25/2013   Tubular adenoma x 1: Recall 5 yrs. Procedure: COLONOSCOPY;  Surgeon: Juanita Craver, MD;  Location: [facility];  Service: Endoscopy;  Laterality: N/A;  . COMBINED HYSTEROSCOPY DIAGNOSTIC / D&C  2/12   Bx neg  . DEXA  08/2007; 05/12/20   Bone density normal 2009 and 2022.  Plan rpt 2024.  Marland Kitchen DILATION AND CURETTAGE OF UTERUS  12/11  . IR IMAGING GUIDED PORT INSERTION  03/16/2018  . IR REMOVAL TUN ACCESS W/ PORT W/O FL MOD SED  09/20/2018  . LYMPH NODE BIOPSY N/A 02/19/2018   Procedure: Sentinel LYMPH NODE BIOPSY;  Surgeon: Everitt Amber, MD;  Location: [facility];  Service: Gynecology;  Laterality: N/A;  . REPLACEMENT TOTAL KNEE Left 5/12   X 2 on each  . ROBOTIC ASSISTED TOTAL HYSTERECTOMY WITH BILATERAL SALPINGO OOPHERECTOMY N/A 02/19/2018   For endometrial cancer.  Procedure: XI ROBOTIC ASSISTED TOTAL HYSTERECTOMY WITH BILATERAL SALPINGO OOPHORECTOMY;  Surgeon: Everitt Amber, MD;  Location: [facility];  Service: Gynecology;  Laterality: N/A;  .  TRANSTHORACIC ECHOCARDIOGRAM  10/11/2016    EF 55-60%, grd I DD.  . TUBAL LIGATION  1985   C-Section    Outpatient Medications Prior to Visit  Medication Sig Dispense Refill  . albuterol (PROVENTIL) (2.5 MG/3ML) 0.083% nebulizer solution Take 3 mLs (2.5 mg total) by nebulization every 4 (four) hours as needed for wheezing or shortness of breath (Dx: J45.50). 75 mL 1  . albuterol (VENTOLIN HFA) 108 (90 Base) MCG/ACT inhaler INHALE 2 PUFFS INTO LUNGS EVERY 6 HOURS AS NEEDED FOR WHEEZING/SHORTNESS OF BREATH 18 each 1  . amitriptyline (ELAVIL) 25 MG tablet TAKE 1 TABLET BY MOUTH EVERYDAY AT BEDTIME 90 tablet 3  . clobetasol ointment (TEMOVATE) 0.93 % Apply 1 application topically at bedtime. Apply to the  skin of the vulva for 12 weeks 30 g 2  . Continuous Blood Gluc Receiver (FREESTYLE LIBRE 14 DAY READER) DEVI by Does not apply route.    . diclofenac Sodium (VOLTAREN) 1 % GEL Apply 2 g topically 4 (four) times daily. 100 g 3  . diphenoxylate-atropine (LOMOTIL) 2.5-0.025 MG tablet 1-2 tabs po qid prn diarrhea 30 tablet 2  . felodipine (PLENDIL) 10 MG 24 hr tablet TAKE 1 TABLET BY MOUTH EVERY DAY 30 tablet 0  . fluticasone (CUTIVATE) 0.05 % cream APPLY TO AFFECTED AREA OF R LOWER LEG TWICE PER DAY. 30 g 0  . fluticasone (VERAMYST) 27.5 MCG/SPRAY nasal spray Place 1 spray into the nose daily as needed for rhinitis.    . furosemide (LASIX) 20 MG tablet TAKE 1 TABLET BY MOUTH EVERY DAY 30 tablet 0  . HYDROcodone-acetaminophen (NORCO/VICODIN) 5-325 MG tablet 1-2 tabs po bid prn pain 90 tablet 0  . insulin aspart (NOVOLOG) 100 UNIT/ML injection USE 20-25 units under skin 3x a day before meals 90 mL 3  . insulin NPH Human (NOVOLIN N) 100 UNIT/ML injection Inject 30 units 3x a day under skin 50 mL 3  . Insulin Syringes, Disposable, U-100 0.3 ML MISC Use to inject insulin--6 injections per day 540 each 3  . levocetirizine (XYZAL) 5 MG tablet Take 5 mg by mouth at bedtime as needed for allergies.   5  .  levothyroxine (SYNTHROID) 125 MCG tablet TAKE 1 TABLET BY MOUTH EVERY DAY 90 tablet 2  . metFORMIN (GLUCOPHAGE) 500 MG tablet TAKE 1 TABLET BY MOUTH EVERY MORNING AND 2 TABLETS BY MOUTH AT BEDTIME. 270 tablet 3  . miconazole (MICOTIN) 2 % powder Apply 1 application topically 2 (two) times daily as needed for itching.    . pravastatin (PRAVACHOL) 40 MG tablet Take 1 tablet (40 mg total) by mouth daily. 90 tablet 1  . Probiotic Product (PROBIOTIC PO) Take 1 capsule by mouth daily.    . SYMBICORT 160-4.5 MCG/ACT inhaler INHALE 2 PUFFS BY MOUTH TWICE A DAY 30.6 each 1   No facility-administered medications prior to visit.    Allergies  Allergen Reactions  . Adhesive [Tape] Other (See Comments)    Takes patient's skin off.  . Banana Diarrhea and Nausea Only  . Eggs Or Egg-Derived Products Diarrhea and Nausea Only  . Latex Other (See Comments)    sensativity  . Linzess [Linaclotide] Diarrhea  . Ozempic (0.25 Or 0.5 Mg-Dose) [Semaglutide(0.25 Or 0.5mg -Dos)] Diarrhea  . Penicillins Rash and Other (See Comments)    Has had cephalosporins without incident  . Pine Swelling and Rash  . Rose Swelling and Rash    ROS As per HPI  PE: Vitals with BMI 07/01/2020 06/30/2020 06/16/2020  Height 5\' 1"  5\' 1"  5\' 1"   Weight 265 lbs 6 oz 264 lbs 6 oz 266 lbs 5 oz  BMI 50.17 16.60 63.01  Systolic 601 093 235  Diastolic 69 78 56  Pulse 79 91 93   Gen: Alert, well appearing.  Patient is oriented to person, place, time, and situation. AFFECT: pleasant, lucid thought and speech. CV: RRR, 5-7/3 systolic murmur, no diastolic murmur.  No r/g.   LUNGS: CTA bilat, nonlabored resps, good aeration in all lung fields. EXT: no clubbing or cyanosis.  1+ bilat LL pitting edema.    LABS:  Lab Results  Component Value Date   TSH 2.88 12/18/2019   Lab Results  Component Value Date   WBC 10.1 01/21/2019   HGB 10.3 (  L) 01/21/2019   HCT 31.5 (L) 01/21/2019   MCV 96.1 01/21/2019   PLT 223.0 01/21/2019  No  results found for: IRON, TIBC, FERRITIN No results found for: VITAMINB12  Lab Results  Component Value Date   CREATININE 0.85 06/16/2020   BUN 23 06/16/2020   NA 139 06/16/2020   K 3.7 06/16/2020   CL 104 06/16/2020   CO2 25 06/16/2020   Lab Results  Component Value Date   ALT 17 12/18/2019   AST 18 12/18/2019   ALKPHOS 62 12/18/2019   BILITOT 0.4 12/18/2019   Lab Results  Component Value Date   CHOL 172 12/18/2019   Lab Results  Component Value Date   HDL 39.60 12/18/2019   Lab Results  Component Value Date   LDLCALC 99 12/18/2019   Lab Results  Component Value Date   TRIG 164.0 (H) 12/18/2019   Lab Results  Component Value Date   CHOLHDL 4 12/18/2019   Lab Results  Component Value Date   HGBA1C 9.1 (A) 05/15/2020    IMPRESSION AND PLAN:  1) Chronic pain syndrome: Stable on vicodin 5/325, 1-2 bid prn, #90--new rx today. CSC UTD. Will increase amitriptyline to 50mg  qhs.  2) HLD: tolerating pravastatin 40mg  qd. Plan rpt lipid panel and hepatic panel at next f/u in 3 mo (not fasting today).  3) HTN: well controlled. Cont plendil 10mg  qd.     4) Hypothyroidism: annual TSHs repeatedly wnl dating back to 2016, most recent 12/18/19. Plan rpt 51mo.  5) Severe persistent asthma: due to financial constraints she could not get her symbicort a few weeks ago, does not inc chest tightness since being off this med.  Is now able to pay for this so she needs new rx--given today.  6) DM 2, much improved control, working with Dr. Cruzita Lederer in Endo and this is going well  No labs needed today  An After Visit Summary was printed and given to the patient.  FOLLOW UP: No follow-ups on file.  Signed:  Crissie Sickles, MD           07/01/2020

## 2020-07-09 ENCOUNTER — Other Ambulatory Visit: Payer: Self-pay | Admitting: Family Medicine

## 2020-08-09 ENCOUNTER — Other Ambulatory Visit: Payer: Self-pay | Admitting: Family Medicine

## 2020-08-10 ENCOUNTER — Ambulatory Visit: Payer: Self-pay | Admitting: Radiation Oncology

## 2020-08-20 ENCOUNTER — Other Ambulatory Visit: Payer: Self-pay

## 2020-08-20 MED ORDER — HYDROCODONE-ACETAMINOPHEN 5-325 MG PO TABS: ORAL_TABLET | ORAL | 0 refills | Status: DC

## 2020-08-20 NOTE — Telephone Encounter (Signed)
Requesting:HYDROcodone-acetaminophen (NORCO/VICODIN) 5-325 MG tablet Contract:12/18/19 UDS:12/18/19 Last Visit:07/01/20 Next Visit:10/07/20 Last Refill:07/01/20 (90,0)  Please Advise

## 2020-08-21 NOTE — Telephone Encounter (Signed)
Pt called stating that the Rx that was sent could not be refilled because the pharmacy did not have it in stock. [facility] [address] in [place] that she would be able to get it at. [facility] [address]

## 2020-09-01 ENCOUNTER — Ambulatory Visit (INDEPENDENT_AMBULATORY_CARE_PROVIDER_SITE_OTHER): Payer: Medicare Other | Admitting: Internal Medicine

## 2020-09-01 ENCOUNTER — Other Ambulatory Visit: Payer: Self-pay

## 2020-09-01 ENCOUNTER — Encounter: Payer: Self-pay | Admitting: Internal Medicine

## 2020-09-01 VITALS — BP 128/82 | HR 103 | Ht 61.0 in | Wt 260.2 lb

## 2020-09-01 DIAGNOSIS — E1165 Type 2 diabetes mellitus with hyperglycemia: Secondary | ICD-10-CM

## 2020-09-01 DIAGNOSIS — IMO0002 Reserved for concepts with insufficient information to code with codable children: Secondary | ICD-10-CM

## 2020-09-01 DIAGNOSIS — L89309 Pressure ulcer of unspecified buttock, unspecified stage: Secondary | ICD-10-CM | POA: Diagnosis not present

## 2020-09-01 DIAGNOSIS — E11319 Type 2 diabetes mellitus with unspecified diabetic retinopathy without macular edema: Secondary | ICD-10-CM

## 2020-09-01 DIAGNOSIS — E785 Hyperlipidemia, unspecified: Secondary | ICD-10-CM

## 2020-09-01 LAB — POCT GLYCOSYLATED HEMOGLOBIN (HGB A1C): Hemoglobin A1C: 6.3 % — AB (ref 4.0–5.6)

## 2020-09-01 MED ORDER — DOXYCYCLINE HYCLATE 100 MG PO TABS: 100.0000 mg | ORAL_TABLET | Freq: Two times a day (BID) | ORAL | 0 refills | Status: DC

## 2020-09-01 NOTE — Progress Notes (Signed)
ID: [name], female   DOB: May 30, 1950, 70 y.o.   MRN: [id number]   This visit occurred during the SARS-CoV-2 public health emergency.  Safety protocols were in place, including screening questions prior to the visit, additional usage of staff PPE, and extensive cleaning of exam room while observing appropriate contact time as indicated for disinfecting solutions.   HPI: [name] is a 70 y.o.-year-old female, initially referred by her PCP, Dr. Ernestine Conrad, returning for follow-up for DM2, dx in 1992, insulin-dependent since 2004-2005, uncontrolled, with complications (DR). DM2, dx in 1992, insulin-dependent since 2004-2005, uncontrolled, with complications (DR).  Last visit 3.5 months ago.  She is here with her husband who offers part of the history especially related to her past medical history, medications, and activity. On [facility].  Interim history: No increased urination, blurry vision, nausea. She has 2 pressure ulcers  - she started to put silver sulfadiazine on them,  Reviewed HbA1c levels: Lab Results  Component Value Date   HGBA1C 9.1 (A) 05/15/2020   HGBA1C 7.7 (A) 12/26/2019   HGBA1C 6.6 (A) 05/07/2019   HGBA1C 8.8 (H) 01/21/2019   HGBA1C 7.5 (H) 10/05/2018   HGBA1C 7.0 (A) 05/18/2018   HGBA1C 7.1 (H) 02/15/2018   HGBA1C 7.3 (A) 01/03/2018   HGBA1C 9.4 (H) 10/04/2017   HGBA1C 8.7 (H) 06/28/2017   She is on: Insulin Before breakfast Before lunch Before dinner  Novolog 25 >> 25-28 25 >> 15-25  25  NPH 30 >> 25-30 30 30  >> 30-35   Also: - Metformin 1000 mg twice a day.  She tried Ozempic 0.5 mg weekly >> significant diarrhea, abd. Pain, food intolerance >> started 04/2019 -stopped 07/2019  She is checking more than 4 times a day with her CGM-started before last visit.  She has to use skin tac to be able to tolerate the adhesive.  Previously:   Lowest sugar was 50 >> 65-70 (more active) >> 50s; she has hypoglycemia awareness in the 70s.  Highest sugar was 300 - pizza >> 200 >> 260s.  Glucometer: [facility]  Pt's meals are: - Breakfast: oatmeal -  instant, peacans, butter >> sausage, bacon, apple sauce, velveeta crackers - Lunch: tortilla + PB and J or [place] and cheese >> yoghurt, apple sauce, half a sandwich - Dinner: meat (hamburger, chicken, bratwurst), spaghetti, green beans, mixed veggies - Snacks:   -No CKD, last BUN/creatinine:  Lab Results  Component Value Date   BUN 23 06/16/2020   BUN 25 (H) 12/18/2019   CREATININE 0.85 06/16/2020   CREATININE 0.84 12/18/2019   -+ HL; last set of lipids: Lab Results  Component Value Date   CHOL 172 12/18/2019   HDL 39.60 12/18/2019   LDLCALC 99 12/18/2019   LDLDIRECT 78.0 10/05/2018   TRIG 164.0 (H) 12/18/2019   CHOLHDL 4 12/18/2019  On pravastatin 20.  - last eye exam was in 06/2017: + DR  -+ numbness and tingling in her feet -chemotherapy related.  Pt has FH of DM in daughter, brother, father, P aunts.  She also has a history of endometrial cancer - 2019, also, OSA, chemotherapy-induced peripheral neuropathy, hypothyroidism-on levothyroxine 125 mcg daily.  Latest TSH was normal: Lab Results  Component Value Date   TSH 2.88 12/18/2019   She lost a child at 55 y/o in 1990 >> she gained 100 lbs and was dx'ed with DM afterwards.  No h/o pancreatitis or FH of MTC. Brother died of Lewy body dementia.  ROS: Constitutional: no weight gain/no weight loss, no fatigue, no subjective hyperthermia, no subjective hypothermia Eyes: no  blurry vision, no xerophthalmia ENT: no sore throat, no nodules palpated in neck, no dysphagia, no odynophagia, no hoarseness Cardiovascular: no CP/no SOB/no palpitations/no leg swelling Respiratory: no cough/no SOB/no wheezing Gastrointestinal: no N/no V/no D/no C/no acid reflux Musculoskeletal: no muscle aches/no joint aches Skin: no rashes, no hair loss Neurological: no tremors/+ numbness/+ tingling/no dizziness  I reviewed pt's medications, allergies, PMH, social hx, family hx, and changes were documented in the history of present illness.  Otherwise, unchanged from my initial visit note.  Past Medical History:  Diagnosis Date  . Allergic rhinitis    Allergy testing: results pending as of 05/28/16 (Dr. Harold Hedge)  . Anemia   . Arthritis   . Chemotherapy-induced neuropathy ([place]) 2019  . Cough variant asthma   . DDD (degenerative disc disease), lumbar   . Diabetes mellitus with complication (HCC)    Mild nonprolif DR R eye 12/2017-->referred to retinal specialist DM MANAGED BY DR. Ahjanae Cassel AS OF 2021  . Endometrial cancer ([place]) 02/2018   REMISSION AS OF 08/2018.  Stage III (T3, Nx, Mx)  Path: endometroid adenocarcinoma involving cervix, uterus, and fallopian tubes (Pt got TAH & BSO 02/2018). No sign of recurrence as of onc f/u 05/2019.  Marland Kitchen GERD (gastroesophageal reflux disease)   . Hemorrhoids   . Herpes zoster 10/2015   L side belt-line  . History of adenomatous polyp of colon 02/25/13   5 mm cecal polyp removed by Dr. Trevor Mace 5 yrs  . History of blood transfusion   . History of cellulitis 08/2010   Left (Since replacemnt of Left knee  . Hyperkalemia 03/2018   started after pt started getting chemo-->had to stop enalapril.  Marland Kitchen Hyperlipidemia    Not on statin b/c lipids "stayed down when sugars came down" per pt.  She says Dr. Chalmers Cater knows she is not on statin anymore.  . Hypertension   . Hypothyroidism   . Nephrolithiasis 4/07  . Obesity   . OSA on CPAP   . Osteoarthritis    knees, ankle, + ? scapholunate ligament disruption (x-ray 06/2017)--ortho referral.  . Pancytopenia due to antineoplastic chemotherapy ([place])    progressive as of 06/2018. Stable 08/2018.  Marland Kitchen Recurrent UTI    started cipro 250 qd 09/2018  . Sciatica of left side   . Severe persistent asthma    cough-variant--saw Allergist 05/25/16 and was switched from max dose advair to symbicort.  . Systolic murmur    ECHO fine 10/2016-->murmur likely flow murmur assoc with HTN.  Marland Kitchen Zoon's vulvitis    Bx-proven (GYN) -lichen sclerosis.  Clobetasol 0.05% ointment  per GYN   Past Surgical History:  Procedure Laterality Date  . ANKLE FRACTURE SURGERY  1984   Pin & repair  . ARTHROSCOPIC REPAIR ACL  2000   Due to ACL tear  . ARTHROSCOPIC REPAIR ACL  5/08  . Blateral knee rerlacements x4 2 times each knee    . BREAST BIOPSY Right 2014   Benign  . CARDIAC CATHETERIZATION  5/07   clear vessel mild mitral   . CARPAL TUNNEL RELEASE Right [id number]   1989 Left  . CESAREAN SECTION  1985  . CHOLECYSTECTOMY OPEN  1988  . COLONOSCOPY N/A 02/25/2013   Tubular adenoma x 1: Recall 5 yrs. Procedure: COLONOSCOPY;  Surgeon: Juanita Craver, MD;  Location: [facility];  Service: Endoscopy;  Laterality: N/A;  . COMBINED HYSTEROSCOPY DIAGNOSTIC / D&C  2/12   Bx neg  . DEXA  08/2007; 05/12/20   Bone density normal 2009 and  2022.  Plan rpt 2024.  Marland Kitchen DILATION AND CURETTAGE OF UTERUS  12/11  . IR IMAGING GUIDED PORT INSERTION  03/16/2018  . IR REMOVAL TUN ACCESS W/ PORT W/O FL MOD SED  09/20/2018  . LYMPH NODE BIOPSY N/A 02/19/2018   Procedure: Sentinel LYMPH NODE BIOPSY;  Surgeon: Everitt Amber, MD;  [facility];  Service: Gynecology;  Laterality: N/A;  . REPLACEMENT TOTAL KNEE Left 5/12   X 2 on each  . ROBOTIC ASSISTED TOTAL HYSTERECTOMY WITH BILATERAL SALPINGO OOPHERECTOMY N/A 02/19/2018   For endometrial cancer.  Procedure: XI ROBOTIC ASSISTED TOTAL HYSTERECTOMY WITH BILATERAL SALPINGO OOPHORECTOMY;  Surgeon: Everitt Amber, MD;  Location: [place];  Service: Gynecology;  Laterality: N/A;  . TRANSTHORACIC ECHOCARDIOGRAM  10/11/2016    EF 55-60%, grd I DD.  . TUBAL LIGATION  1985   C-Section   Social History   Socioeconomic History  . Marital status: Married    Spouse name: Louie Casa  . Number of children: 2  . Years of education: Not on file  . Highest education level: Not on file  Occupational History  . Occupation: retired Therapist, sports  Tobacco Use  . Smoking status: Never Smoker  . Smokeless tobacco: Never Used  Vaping Use  .  Vaping Use: Never used  Substance and Sexual Activity  . Alcohol use: No    Alcohol/week: 0.0 standard drinks  . Drug use: No  . Sexual activity: Never    Partners: Male    Birth control/protection: I.U.D.  Other Topics Concern  . Not on file  Social History Narrative  . Not on file   Social Determinants of Health   Financial Resource Strain: Low Risk   . Difficulty of Paying Living Expenses: Not hard at all  Food Insecurity: No Food Insecurity  . Worried About Charity fundraiser in the Last Year: Never true  . Ran Out of Food in the Last Year: Never true  Transportation Needs: No Transportation Needs  . Lack of Transportation (Medical): No  . Lack of Transportation (Non-Medical): No  Physical Activity: Insufficiently Active  . Days of Exercise per Week: 4 days  . Minutes of Exercise per Session: 20 min  Stress: No Stress Concern Present  . Feeling of Stress : Not at all  Social Connections: Moderately Isolated  . Frequency of Communication with Friends and Family: More than three times a week  . Frequency of Social Gatherings with Friends and Family: Once a week  . Attends Religious Services: Never  . Active Member of Clubs or Organizations: No  . Attends Archivist Meetings: Never  . Marital Status: Married  Human resources officer Violence: Not At Risk  . Fear of Current or Ex-Partner: No  . Emotionally Abused: No  . Physically Abused: No  . Sexually Abused: No   Current Outpatient Medications on File Prior to Visit  Medication Sig Dispense Refill  . albuterol (PROVENTIL) (2.5 MG/3ML) 0.083% nebulizer solution Take 3 mLs (2.5 mg total) by nebulization every 4 (four) hours as needed for wheezing or shortness of breath (Dx: J45.50). 75 mL 1  . albuterol (VENTOLIN HFA) 108 (90 Base) MCG/ACT inhaler INHALE 2 PUFFS INTO LUNGS EVERY 6 HOURS AS NEEDED FOR WHEEZING/SHORTNESS OF BREATH 18 each 1  . amitriptyline (ELAVIL) 25 MG tablet 2 tabs po qhs 180 tablet 3  .  clobetasol ointment (TEMOVATE) 4.19 % Apply 1 application topically at bedtime. Apply to the skin of the vulva for 12 weeks 30  g 2  . Continuous Blood Gluc Receiver (FREESTYLE LIBRE 14 DAY READER) DEVI by Does not apply route.    . diclofenac Sodium (VOLTAREN) 1 % GEL Apply 2 g topically 4 (four) times daily. 100 g 3  . diphenoxylate-atropine (LOMOTIL) 2.5-0.025 MG tablet 1-2 tabs po qid prn diarrhea 30 tablet 2  . felodipine (PLENDIL) 10 MG 24 hr tablet TAKE 1 TABLET BY MOUTH EVERY DAY 90 tablet 1  . fluticasone (CUTIVATE) 0.05 % cream APPLY TO AFFECTED AREA OF R LOWER LEG TWICE PER DAY. 30 g 0  . fluticasone (VERAMYST) 27.5 MCG/SPRAY nasal spray Place 1 spray into the nose daily as needed for rhinitis.    . furosemide (LASIX) 20 MG tablet TAKE 1 TABLET BY MOUTH EVERY DAY 90 tablet 1  . HYDROcodone-acetaminophen (NORCO/VICODIN) 5-325 MG tablet 1-2 tabs po bid prn pain 90 tablet 0  . insulin aspart (NOVOLOG) 100 UNIT/ML injection USE 20-25 units under skin 3x a day before meals 90 mL 3  . insulin NPH Human (NOVOLIN N) 100 UNIT/ML injection Inject 30 units 3x a day under skin 50 mL 3  . Insulin Syringes, Disposable, U-100 0.3 ML MISC Use to inject insulin--6 injections per day 540 each 3  . levocetirizine (XYZAL) 5 MG tablet Take 5 mg by mouth at bedtime as needed for allergies.   5  . levothyroxine (SYNTHROID) 125 MCG tablet TAKE 1 TABLET BY MOUTH EVERY DAY 90 tablet 2  . metFORMIN (GLUCOPHAGE) 500 MG tablet TAKE 1 TABLET BY MOUTH EVERY MORNING AND 2 TABLETS BY MOUTH AT BEDTIME. 270 tablet 3  . miconazole (MICOTIN) 2 % powder Apply 1 application topically 2 (two) times daily as needed for itching.    . pravastatin (PRAVACHOL) 40 MG tablet Take 1 tablet (40 mg total) by mouth daily. 90 tablet 1  . Probiotic Product (PROBIOTIC PO) Take 1 capsule by mouth daily.    . SYMBICORT 160-4.5 MCG/ACT inhaler INHALE 2 PUFFS BY MOUTH TWICE A DAY 30.6 each 3  . [DISCONTINUED] prochlorperazine (COMPAZINE) 10 MG  tablet Take 1 tablet (10 mg total) by mouth every 6 (six) hours as needed (Nausea or vomiting). 30 tablet 1   No current facility-administered medications on file prior to visit.   Allergies  Allergen Reactions  . Adhesive [Tape] Other (See Comments)    Takes patient's skin off.  . Banana Diarrhea and Nausea Only  . Eggs Or Egg-Derived Products Diarrhea and Nausea Only  . Latex Other (See Comments)    sensativity  . Linzess [Linaclotide] Diarrhea  . Ozempic (0.25 Or 0.5 Mg-Dose) [Semaglutide(0.25 Or 0.5mg -Dos)] Diarrhea  . Penicillins Rash and Other (See Comments)    Has had cephalosporins without incident  . Pine Swelling and Rash  . Rose Swelling and Rash   Family History  Problem Relation Age of Onset  . Diabetes Father   . COPD Father   . Stroke Father   . Celiac disease Brother   . Congenital heart disease Brother   . Rheum arthritis Brother   . Thyroid disease Brother   . Diabetes Brother   . Dementia Brother        Lewy Body  . Alzheimer's disease Mother   . Arthritis Mother   . Other Son        Died age 63 -Tetrology of Fallot - VSD/pulmonary atresia  . Breast cancer Other        postmenopausal when diagnosed   PE: BP 128/82 (BP Location: Right Arm, Patient Position:  Sitting, Cuff Size: Normal)   Pulse (!) 103   Ht 5\' 1"  (1.549 m)   Wt 260 lb 3.2 oz (118 kg)   LMP 08/10/2010 Comment: Hysterectomy 02/02/18  SpO2 96%   BMI 49.16 kg/m  Wt Readings from Last 3 Encounters:  09/01/20 260 lb 3.2 oz (118 kg)  07/01/20 265 lb 6.4 oz (120.4 kg)  06/30/20 264 lb 6.4 oz (119.9 kg)   Constitutional: obese, in NAD, in wheelchair Eyes: PERRLA, EOMI, no exophthalmos ENT: moist mucous membranes, no thyromegaly, no cervical lymphadenopathy Cardiovascular: Tachycardia, RR, No MRG, + bilateral LE pitting edema Respiratory: CTA B Gastrointestinal: abdomen soft, NT, ND, BS+ Musculoskeletal: no deformities, strength intact in all 4 Skin: moist, warm, +peri labial rash and 2  ulcers (bilateral)  Neurological: no tremor with outstretched hands, DTR normal in all 4  ASSESSMENT: 1. DM2, insulin-dependent, uncontrolled, with complications - mild NP DR OD  2. HL  3.  Obesity class III  4.  Buttocks pressure ulcer  PLAN:  1. Patient with longstanding, uncontrolled, type 2 diabetes, on metformin and basal-bolus insulin regimen. At last visit, HbA1c was 9.1%, still quite high.  However, per review of her CGM downloads, the projected HbA1c was much better, at 6.8% and the majority of her blood sugars were in range.  She had higher blood sugars after breakfast so I suggested to increase NovoLog before this meal, and the sugars were decreasing abruptly after lunch (we decrease NovoLog before lunch and also NPH in the morning), and the sugars were again higher after dinner, but there was more variability and decreases were not as significant as with breakfast so we did not change the NovoLog dose before this meal.  Sugars are higher overnight so I advised her to increase NPH in the evening.  We continued metformin. -Of note, we tried Ozempic but she could not tolerate this due to GI side effects. CGM interpretation: -At today's visit, we reviewed her CGM downloads: It appears that 88% of values are in target range (goal >70%), while 10% are higher than 180 (goal <25%), and 2% are lower than 70 (goal <4%).  The calculated average blood sugar is 129.  The projected HbA1c for the next 3 months (GMI) is 6.4%. -Reviewing the CGM trends, the sugars appear to be higher after breakfast, after which, they decrease abruptly and they stay in the normal range throughout the rest of the day.  However, she does have blood sugars in the 60s between approximately 2 PM and 12 AM.  After this, sugars increased and she has another hypoglycemic peak around 3 AM.  Upon questioning, she is eating a snack at night, usually yogurt, to prevent lower blood sugars.  I advised her to stop the snack but in  the meantime, we will reduce her insulin doses.  To avoid the low blood sugars from approximately 1 PM to 12 AM, we will reduce NPH with breakfast and lunch.  In the meantime, we will lower the NovoLog before breakfast, lunch, and dinner. -At today's visit, in the office, her sugar was 66.  She was given Coca-Cola. - I suggested to:  Patient Instructions   Patient Instructions  Please continue: - Metformin 500 mg in am and 1000 mg with dinner  Insulin Before breakfast Before lunch Before dinner  Novolog 28 15 25   NPH 25 30 35   Please come back for a follow-up appointment in 3 months.  - we checked her HbA1c: 6.3% (much lower) - advised to  check sugars at different times of the day - 1x a day, rotating check times - advised for yearly eye exams >> she is UTD - return to clinic in 3 months  2. HL -Reviewed latest lipid panel from 12/2019: LDL higher, above target, triglycerides slightly high: Lab Results  Component Value Date   CHOL 172 12/18/2019   HDL 39.60 12/18/2019   LDLCALC 99 12/18/2019   LDLDIRECT 78.0 10/05/2018   TRIG 164.0 (H) 12/18/2019   CHOLHDL 4 12/18/2019  -Continues pravastatin 20 mg daily, without side effects  3.  Obesity class III -Unfortunately, she could not tolerate Ozempic, which would have helped with weight loss -She lost approximately 4 pounds since last visit  4.  Pressure ulcer on buttocks -Please see physical exam -This does not appear to be draining -We will prescribe doxycycline 100 mg twice a day for 7 days -I also refer her to wound care -I did advise her to follow-up with PCP before she finished antibiotics    Philemon Kingdom, MD PhD [facility] Endocrinology

## 2020-09-01 NOTE — Patient Instructions (Addendum)
Please continue: - Metformin 500 mg in am and 1000 mg with dinner  Insulin Before breakfast Before lunch Before dinner  Novolog 22-25 15-20 15-20  NPH 20 20 35   Please start: - Doxycycline 100 mg twice a day for 7 days  Please follow-up with PCP before you finish antibiotics or if the infection gets worse.  Please come back for a follow-up appointment in 4 months.

## 2020-09-21 ENCOUNTER — Other Ambulatory Visit: Payer: Self-pay | Admitting: Internal Medicine

## 2020-09-25 ENCOUNTER — Other Ambulatory Visit: Payer: Self-pay

## 2020-09-25 ENCOUNTER — Encounter (HOSPITAL_BASED_OUTPATIENT_CLINIC_OR_DEPARTMENT_OTHER): Payer: Medicare Other | Attending: Internal Medicine | Admitting: Internal Medicine

## 2020-09-25 DIAGNOSIS — Z91040 Latex allergy status: Secondary | ICD-10-CM | POA: Insufficient documentation

## 2020-09-25 DIAGNOSIS — Z880 Allergy status to penicillin: Secondary | ICD-10-CM | POA: Diagnosis not present

## 2020-09-25 DIAGNOSIS — Z888 Allergy status to other drugs, medicaments and biological substances status: Secondary | ICD-10-CM | POA: Diagnosis not present

## 2020-09-25 DIAGNOSIS — Z923 Personal history of irradiation: Secondary | ICD-10-CM | POA: Insufficient documentation

## 2020-09-25 DIAGNOSIS — L97118 Non-pressure chronic ulcer of right thigh with other specified severity: Secondary | ICD-10-CM | POA: Diagnosis not present

## 2020-09-25 DIAGNOSIS — Z8542 Personal history of malignant neoplasm of other parts of uterus: Secondary | ICD-10-CM | POA: Insufficient documentation

## 2020-09-25 DIAGNOSIS — L98498 Non-pressure chronic ulcer of skin of other sites with other specified severity: Secondary | ICD-10-CM | POA: Diagnosis not present

## 2020-09-25 DIAGNOSIS — Z6841 Body Mass Index (BMI) 40.0 and over, adult: Secondary | ICD-10-CM | POA: Diagnosis not present

## 2020-09-25 DIAGNOSIS — Z90710 Acquired absence of both cervix and uterus: Secondary | ICD-10-CM | POA: Insufficient documentation

## 2020-09-25 DIAGNOSIS — L89893 Pressure ulcer of other site, stage 3: Secondary | ICD-10-CM | POA: Diagnosis not present

## 2020-09-25 NOTE — Progress Notes (Signed)
[name], [name] ([id number]) Visit Report for 09/25/2020 Abuse/Suicide Risk Screen Details Patient Name: Date of Service: [name], [name] 09/25/2020 1:15 PM Medical Record Number: [id number] Patient Account Number: [id number] Date of Birth/Sex: Treating RN: [birth year] (70 y.o. Elam Dutch Primary Care Cartier Washko: Shawnie Dapper Other Clinician: Referring Farah Benish: Treating Dayanara Sherrill/Extender: Tomie China in Treatment: 0 Abuse/Suicide Risk Screen Items Answer ABUSE RISK SCREEN: Has anyone close to you tried to hurt or harm you recentlyo No Do you feel uncomfortable with anyone in your familyo No Has anyone forced you do things that you didnt want to doo No Electronic Signature(s) Signed: 09/25/2020 5:33:29 PM By: Baruch Gouty RN, BSN Entered By: Baruch Gouty on 09/25/2020 13:55:52 -------------------------------------------------------------------------------- Activities of Daily Living Details Patient Name: Date of Service: [name], [name] 09/25/2020 1:15 PM Medical Record Number: [id number] Patient Account Number: [id number] Date of Birth/Sex: Treating RN: 1950-09-13 (71 y.o. Elam Dutch Primary Care Shawnta Schlegel: Shawnie Dapper Other Clinician: Referring Toba Claudio: Treating Bach Rocchi/Extender: Tomie China in Treatment: 0 Activities of Daily Living Items Answer Activities of Daily Living (Please select one for each item) Drive Automobile Completely Able T Medications ake Completely Able Use T elephone Completely Able Care for Appearance Completely Able Use T oilet Completely Able Bath / Shower Completely Able Dress Self Completely Able Feed Self Completely Able Walk Need Assistance Get In / Out Bed Completely Able Housework Completely Able Prepare Meals Completely [place] for Self Completely Able Electronic Signature(s) Signed: 09/25/2020 5:33:29 PM By: Baruch Gouty  RN, BSN Entered By: Baruch Gouty on 09/25/2020 13:56:19 -------------------------------------------------------------------------------- Education Screening Details Patient Name: Date of Service: [name] 09/25/2020 1:15 PM Medical Record Number: [id number] Patient Account Number: [id number] Date of Birth/Sex: Treating RN: 08/18/1950 (70 y.o. Elam Dutch Primary Care Murial Beam: Shawnie Dapper Other Clinician: Referring Chaselynn Kepple: Treating Falesha Schommer/Extender: Tomie China in Treatment: 0 Primary Learner Assessed: Patient Learning Preferences/Education Level/Primary Language Learning Preference: Explanation, Demonstration, Printed Material Highest Education Level: College or Above Preferred Language: English Cognitive Barrier Language Barrier: No Translator Needed: No Memory Deficit: No Emotional Barrier: No Cultural/Religious Beliefs Affecting Medical Care: No Physical Barrier Impaired Vision: Yes Glasses Impaired Hearing: No Decreased Hand dexterity: No Knowledge/Comprehension Knowledge Level: High Comprehension Level: High Ability to understand written instructions: High Ability to understand verbal instructions: High Motivation Anxiety Level: Calm Cooperation: Cooperative Education Importance: Acknowledges Need Interest in Health Problems: Asks Questions Perception: Coherent Willingness to Engage in Self-Management High Activities: Readiness to Engage in Self-Management High Activities: Electronic Signature(s) Signed: 09/25/2020 5:33:29 PM By: Baruch Gouty RN, BSN Entered By: Baruch Gouty on 09/25/2020 13:57:23 -------------------------------------------------------------------------------- Fall Risk Assessment Details Patient Name: Date of Service: [name] 09/25/2020 1:15 PM Medical Record Number: [id number] Patient Account Number: [id number] Date of Birth/Sex: Treating RN: 10-Dec-1950 (69 y.o. Elam Dutch Primary Care Edilberto Roosevelt: Shawnie Dapper Other Clinician: Referring Jonluke Cobbins: Treating Aniqa Hare/Extender: Tomie China in Treatment: 0 Fall Risk Assessment Items Have you had 2 or more falls in the last 12 monthso 0 No Have you had any fall that resulted in injury in the last 12 monthso 0 No FALLS RISK SCREEN History of falling - immediate or within 3 months 0 No Secondary diagnosis (Do you have 2 or more medical diagnoseso) 15 Yes Ambulatory aid None/bed rest/wheelchair/nurse 0 No Crutches/cane/walker 15 Yes Furniture 0 No Intravenous therapy Access/Saline/Heparin Lock 0 No Gait/Transferring Normal/ bed rest/ wheelchair 0 Yes Weak (short  steps with or without shuffle, stooped but able to lift head while walking, may seek 0 No support from furniture) Impaired (short steps with shuffle, may have difficulty arising from chair, head down, impaired 0 No balance) Mental Status Oriented to own ability 0 Yes Electronic Signature(s) Signed: 09/25/2020 5:33:29 PM By: Baruch Gouty RN, BSN Entered By: Baruch Gouty on 09/25/2020 13:58:01 -------------------------------------------------------------------------------- Foot Assessment Details Patient Name: Date of Service: [name] 09/25/2020 1:15 PM Medical Record Number: [id number] Patient Account Number: [id number] Date of Birth/Sex: Treating RN: 09/11/1950 (70 y.o. Elam Dutch Primary Care Cassi Jenne: Shawnie Dapper Other Clinician: Referring Jaquon Gingerich: Treating Daneil Beem/Extender: Tomie China in Treatment: 0 Foot Assessment Items Site Locations + = Sensation present, - = Sensation absent, C = Callus, U = Ulcer R = Redness, W = Warmth, M = Maceration, PU = Pre-ulcerative lesion F = Fissure, S = Swelling, D = Dryness Assessment Right: Left: Other Deformity: No No Prior Foot Ulcer: No No Prior Amputation: No No Charcot Joint: No  No Ambulatory Status: Ambulatory With Help Assistance Device: Cane Gait: Steady Electronic Signature(s) Signed: 09/25/2020 5:33:29 PM By: Baruch Gouty RN, BSN Entered By: Baruch Gouty on 09/25/2020 13:58:48 -------------------------------------------------------------------------------- Nutrition Risk Screening Details Patient Name: Date of Service: [name], [name] 09/25/2020 1:15 PM Medical Record Number: [id number] Patient Account Number: [id number] Date of Birth/Sex: Treating RN: [birth year] (70 y.o. Elam Dutch Primary Care Shonda Mandarino: Shawnie Dapper Other Clinician: Referring Torri Michalski: Treating Raffaella Edison/Extender: Tomie China in Treatment: 0 Height (in): 61 Weight (lbs): 260 Body Mass Index (BMI): 49.1 Nutrition Risk Screening Items Score Screening NUTRITION RISK SCREEN: I have an illness or condition that made me change the kind and/or amount of food I eat 0 No I eat fewer than two meals per day 0 No I eat few fruits and vegetables, or milk products 2 Yes I have three or more drinks of beer, liquor or wine almost every day 0 No I have tooth or mouth problems that make it hard for me to eat 0 No I don't always have enough money to buy the food I need 0 No I eat alone most of the time 0 No I take three or more different prescribed or over-the-counter drugs a day 1 Yes Without wanting to, I have lost or gained 10 pounds in the last six months 0 No I am not always physically able to shop, cook and/or feed myself 0 No Nutrition Protocols Good Risk Protocol Provide education on elevated blood Moderate Risk Protocol 0 sugars and impact on wound healing, as applicable High Risk Proctocol Risk Level: Moderate Risk Score: 3 Electronic Signature(s) Signed: 09/25/2020 5:33:29 PM By: Baruch Gouty RN, BSN Entered By: Baruch Gouty on 09/25/2020 13:58:38

## 2020-09-25 NOTE — Progress Notes (Addendum)
LAYLONI, FAHRNER ([id number]) Visit Report for 09/25/2020 Allergy List Details Patient Name: Date of Service: PALMINA, CLODFELTER 09/25/2020 1:15 PM Medical Record Number: [id number] Patient Account Number: [id number] Date of Birth/Sex: Treating RN: 1951-02-05 (70 y.o. Female) Baruch Gouty Primary Care Sequoyah Ramone: Shawnie Dapper Other Clinician: Referring Chauncey Bruno: Treating Jahkeem Kurka/Extender: Larna Daughters Weeks in Treatment: 0 Allergies Active Allergies adhesive Reaction: blisters banana Reaction: diarrhea, nausea egg Reaction: diarrhea, nausea latex Reaction: rash Linzess Reaction: diarrhea Ozempic Reaction: diarrhea penicillin Reaction: rash pine oil Reaction: swelling rash rose oil Reaction: swelling, rash Allergy Notes Electronic Signature(s) Signed: 09/25/2020 5:33:29 PM By: Baruch Gouty RN, BSN Entered By: Baruch Gouty on 09/25/2020 13:41:15 -------------------------------------------------------------------------------- [place] Details Patient Name: Date of Service: Royann Shivers. 09/25/2020 1:15 PM Medical Record Number: [id number] Patient Account Number: [id number] Date of Birth/Sex: Treating RN: 1951-04-11 (70 y.o. Female) Baruch Gouty Primary Care Jezebelle Ledwell: Shawnie Dapper Other Clinician: Referring Mivaan Corbitt: Treating Buster Schueller/Extender: Tomie China in Treatment: 0 Visit Information Patient Arrived: Wheel Chair Arrival Time: 13:26 Accompanied By: spouse Transfer Assistance: None Patient Identification Verified: Yes Secondary Verification Process Completed: Yes Patient Requires Transmission-Based Precautions: No Patient Has Alerts: No Electronic Signature(s) Signed: 09/25/2020 5:33:29 PM By: Baruch Gouty RN, BSN Entered By: Baruch Gouty on 09/25/2020 13:32:46 -------------------------------------------------------------------------------- Clinic Level of Care Assessment  Details Patient Name: Date of Service: JOLENE, GUYETT 09/25/2020 1:15 PM Medical Record Number: [id number] Patient Account Number: [id number] Date of Birth/Sex: Treating RN: 1951-01-03 (70 y.o. Female) Baruch Gouty Primary Care Rey Dansby: Shawnie Dapper Other Clinician: Referring Ashiyah Pavlak: Treating Vesper Trant/Extender: Tomie China in Treatment: 0 Clinic Level of Care Assessment Items TOOL 2 Quantity Score []  - 0 Use when only an EandM is performed on the INITIAL visit ASSESSMENTS - Nursing Assessment / Reassessment X- 1 20 General Physical Exam (combine w/ comprehensive assessment (listed just below) when performed on new pt. evals) X- 1 25 Comprehensive Assessment (HX, ROS, Risk Assessments, Wounds Hx, etc.) ASSESSMENTS - Wound and Skin A ssessment / Reassessment []  - 0 Simple Wound Assessment / Reassessment - one wound X- 2 5 Complex Wound Assessment / Reassessment - multiple wounds []  - 0 Dermatologic / Skin Assessment (not related to wound area) ASSESSMENTS - Ostomy and/or Continence Assessment and Care []  - 0 Incontinence Assessment and Management []  - 0 Ostomy Care Assessment and Management (repouching, etc.) PROCESS - Coordination of Care X - Simple Patient / Family Education for ongoing care 1 15 []  - 0 Complex (extensive) Patient / Family Education for ongoing care X- 1 10 Staff obtains Programmer, systems, Records, T Results / Process Orders est []  - 0 Staff telephones HHA, Nursing Homes / Clarify orders / etc []  - 0 Routine Transfer to another Facility (non-emergent condition) []  - 0 Routine Hospital Admission (non-emergent condition) []  - 0 New Admissions / Biomedical engineer / Ordering NPWT Apligraf, etc. , []  - 0 Emergency Hospital Admission (emergent condition) X- 1 10 Simple Discharge Coordination []  - 0 Complex (extensive) Discharge Coordination PROCESS - Special Needs []  - 0 Pediatric / Minor Patient Management []  -  0 Isolation Patient Management []  - 0 Hearing / Language / Visual special needs []  - 0 Assessment of Community assistance (transportation, D/C planning, etc.) []  - 0 Additional assistance / Altered mentation []  - 0 Support Surface(s) Assessment (bed, cushion, seat, etc.) INTERVENTIONS - Wound Cleansing / Measurement X- 1 5 Wound Imaging (photographs - any number of wounds) []  - 0 Wound Tracing (instead of photographs)  X- 1 5 Simple Wound Measurement - one wound []  - 0 Complex Wound Measurement - multiple wounds X- 1 5 Simple Wound Cleansing - one wound []  - 0 Complex Wound Cleansing - multiple wounds INTERVENTIONS - Wound Dressings X - Small Wound Dressing one or multiple wounds 2 10 []  - 0 Medium Wound Dressing one or multiple wounds []  - 0 Large Wound Dressing one or multiple wounds []  - 0 Application of Medications - injection INTERVENTIONS - Miscellaneous []  - 0 External ear exam []  - 0 Specimen Collection (cultures, biopsies, blood, body fluids, etc.) []  - 0 Specimen(s) / Culture(s) sent or taken to Lab for analysis []  - 0 Patient Transfer (multiple staff / Harrel Lemon Lift / Similar devices) []  - 0 Simple Staple / Suture removal (25 or less) []  - 0 Complex Staple / Suture removal (26 or more) []  - 0 Hypo / Hyperglycemic Management (close monitor of Blood Glucose) []  - 0 Ankle / Brachial Index (ABI) - do not check if billed separately Has the patient been seen at the hospital within the last three years: Yes Total Score: 125 Level Of Care: New/Established - Level 4 Electronic Signature(s) Signed: 09/25/2020 5:33:29 PM By: Baruch Gouty RN, BSN Entered By: Baruch Gouty on 09/25/2020 14:29:03 -------------------------------------------------------------------------------- Encounter Discharge Information Details Patient Name: Date of Service: Royann Shivers. 09/25/2020 1:15 PM Medical Record Number: [id number] Patient Account Number: [id number] Date of  Birth/Sex: Treating RN: 1950-06-29 (70 y.o. Female) Baruch Gouty Primary Care Deklan Minar: Shawnie Dapper Other Clinician: Referring Rahaf Carbonell: Treating Keyri Salberg/Extender: Tomie China in Treatment: 0 Encounter Discharge Information Items Discharge Condition: Stable Ambulatory Status: Wheelchair Discharge Destination: Home Transportation: Private Auto Accompanied By: spouse Schedule Follow-up Appointment: Yes Clinical Summary of Care: Patient Declined Electronic Signature(s) Signed: 09/25/2020 5:33:29 PM By: Baruch Gouty RN, BSN Entered By: Baruch Gouty on 09/25/2020 15:03:32 -------------------------------------------------------------------------------- Lower Extremity Assessment Details Patient Name: Date of Service: CHARDONNAY, HOLZMANN 09/25/2020 1:15 PM Medical Record Number: [id number] Patient Account Number: [id number] Date of Birth/Sex: Treating RN: [birth year] (70 y.o. Female) Baruch Gouty Primary Care Juwan Vences: Shawnie Dapper Other Clinician: Referring Aylssa Herrig: Treating Morgann Woodburn/Extender: Tomie China in Treatment: 0 Electronic Signature(s) Signed: 09/25/2020 5:33:29 PM By: Baruch Gouty RN, BSN Entered By: Baruch Gouty on 09/25/2020 13:58:59 -------------------------------------------------------------------------------- Multi Wound Chart Details Patient Name: Date of Service: MATTY, DEAMER 09/25/2020 1:15 PM Medical Record Number: [id number] Patient Account Number: [id number] Date of Birth/Sex: Treating RN: 06-19-1950 (70 y.o. Female) Lorrin Jackson Primary Care Dabid Godown: Shawnie Dapper Other Clinician: Referring Azariya Freeman: Treating Bryceson Grape/Extender: Tomie China in Treatment: 0 Vital Signs Height(in): 61 Capillary Blood Glucose(mg/dl): 125 Weight(lbs): 260 Pulse(bpm): 108 Body Mass Index(BMI): 49 Blood Pressure(mmHg): 179/99 Temperature(F):  98.3 Respiratory Rate(breaths/min): 20 Photos: [1:No Photos Right, Medial Upper Leg] [2:No Photos Left Labia] [N/A:N/A N/A] Wound Location: [1:Pressure Injury] [2:Pressure Injury] [N/A:N/A] Wounding Event: [1:Pressure Ulcer] [2:Pressure Ulcer] [N/A:N/A] Primary Etiology: [1:Cataracts, Anemia, Asthma, Sleep] [2:Cataracts, Anemia, Asthma, Sleep] [N/A:N/A] Comorbid History: [1:Apnea, Hypertension, Type II Diabetes, Osteoarthritis, Neuropathy, Diabetes, Osteoarthritis, Neuropathy, Received Chemotherapy, Received Received Chemotherapy, Received Radiation, Confinement Anxiety 07/10/2020] [2:Apnea, Hypertension,  Type II Radiation, Confinement Anxiety 03/20/2020] [N/A:N/A] Date Acquired: [1:0] [2:0] [N/A:N/A] Weeks of Treatment: [1:Open] [2:Open] [N/A:N/A] Wound Status: [1:2.2x1.4x0.1] [2:2x1.4x0.2] [N/A:N/A] Measurements L x W x D (cm) [1:2.419] [2:2.199] [N/A:N/A] A (cm) : rea [1:0.242] [2:0.44] [N/A:N/A] Volume (cm) : [1:Category/Stage III] [2:Category/Stage III] [N/A:N/A] Classification: [1:Small] [2:Small] [N/A:N/A] Exudate A mount: [1:Serosanguineous] [2:Serosanguineous] [N/A:N/A] Exudate Type: [1:red, brown] [2:red, brown] [N/A:N/A]  Exudate Color: [1:Epibole] [2:Flat and Intact] [N/A:N/A] Wound Margin: [1:Large (67-100%)] [2:Large (67-100%)] [N/A:N/A] Granulation A mount: [1:Red] [2:Red] [N/A:N/A] Granulation Quality: [1:Small (1-33%)] [2:Small (1-33%)] [N/A:N/A] Necrotic A mount: [1:Fat Layer (Subcutaneous Tissue): Yes Fat Layer (Subcutaneous Tissue): Yes N/A] Exposed Structures: [1:Fascia: No Tendon: No Muscle: No Joint: No Bone: No None] [2:Fascia: No Tendon: No Muscle: No Joint: No Bone: No None] [N/A:N/A] Treatment Notes Electronic Signature(s) Signed: 09/25/2020 5:27:23 PM By: Linton Ham MD Signed: 09/25/2020 5:38:00 PM By: Lorrin Jackson Entered By: Linton Ham on 09/25/2020  14:44:40 -------------------------------------------------------------------------------- Multi-Disciplinary Care Plan Details Patient Name: Date of Service: NATURE, KUEKER 09/25/2020 1:15 PM Medical Record Number: [id number] Patient Account Number: [id number] Date of Birth/Sex: Treating RN: 1950-08-23 (70 y.o. Female) Lorrin Jackson Primary Care Felishia Wartman: Shawnie Dapper Other Clinician: Referring Lynnett Langlinais: Treating Leelynn Whetsel/Extender: Tomie China in Treatment: 0 Multidisciplinary Care Plan reviewed with physician Active Inactive Abuse / Safety / Falls / Self Care Management Nursing Diagnoses: Potential for falls Goals: Patient/caregiver will verbalize/demonstrate measures taken to prevent injury and/or falls Date Initiated: 09/25/2020 Target Resolution Date: 10/23/2020 Goal Status: Active Interventions: Assess fall risk on admission and as needed Assess impairment of mobility on admission and as needed per policy Notes: Orientation to the Wound Care Program Nursing Diagnoses: Knowledge deficit related to the wound healing center program Goals: Patient/caregiver will verbalize understanding of the [facility] Date Initiated: 09/25/2020 Target Resolution Date: 10/23/2020 Goal Status: Active Interventions: Provide education on orientation to the wound center Notes: Wound/Skin Impairment Nursing Diagnoses: Impaired tissue integrity Goals: Patient/caregiver will verbalize understanding of skin care regimen Date Initiated: 09/25/2020 Target Resolution Date: 10/23/2020 Goal Status: Active Ulcer/skin breakdown will have a volume reduction of 30% by week 4 Date Initiated: 09/25/2020 Target Resolution Date: 10/23/2020 Goal Status: Active Interventions: Assess patient/caregiver ability to obtain necessary supplies Assess patient/caregiver ability to perform ulcer/skin care regimen upon admission and as needed Assess ulceration(s)  every visit Provide education on ulcer and skin care Treatment Activities: Skin care regimen initiated : 09/25/2020 Topical wound management initiated : 09/25/2020 Notes: Electronic Signature(s) Signed: 09/25/2020 5:33:29 PM By: Baruch Gouty RN, BSN Signed: 09/25/2020 5:38:00 PM By: Lorrin Jackson Previous Signature: 09/25/2020 2:06:11 PM Version By: Lorrin Jackson Entered By: Baruch Gouty on 09/25/2020 14:27:29 -------------------------------------------------------------------------------- Pain Assessment Details Patient Name: Date of Service: DALEENA, ROTTER 09/25/2020 1:15 PM Medical Record Number: [id number] Patient Account Number: [id number] Date of Birth/Sex: Treating RN: [birth year] (70 y.o. Female) Baruch Gouty Primary Care Aaric Dolph: Shawnie Dapper Other Clinician: Referring Morgaine Kimball: Treating Mele Sylvester/Extender: Tomie China in Treatment: 0 Active Problems Location of Pain Severity and Description of Pain Patient Has Paino Yes Site Locations Pain Location: Generalized Pain With Dressing Change: No Duration of the Pain. Constant / Intermittento Intermittent Rate the pain. Current Pain Level: 0 Worst Pain Level: 6 Least Pain Level: 0 Character of Pain Describe the Pain: Tender Pain Management and Medication Current Pain Management: Electronic Signature(s) Signed: 09/25/2020 5:33:29 PM By: Baruch Gouty RN, BSN Entered By: Baruch Gouty on 09/25/2020 14:14:55 -------------------------------------------------------------------------------- Patient/Caregiver Education Details Patient Name: Date of Service: Royann Shivers 6/17/2022andnbsp1:15 PM Medical Record Number: [id number] Patient Account Number: [id number] Date of Birth/Gender: Treating RN: [birth year] (70 y.o. Female) Lorrin Jackson Primary Care Physician: Shawnie Dapper Other Clinician: Referring Physician: Treating Physician/Extender: Tomie China in Treatment: 0 Education Assessment Education Provided To: Patient Education Topics Provided Nutrition: Methods: Explain/Verbal, Printed Responses: State content correctly Pressure: Methods: Explain/Verbal, Printed Responses: State  content correctly [place]: Welcome T [facility] [place] [facility] Methods: Explain/Verbal, Printed Responses: State content correctly Wound/Skin Impairment: Methods: Explain/Verbal, Printed Responses: State content correctly Electronic Signature(s) Signed: 09/25/2020 5:38:00 PM By: Lorrin Jackson Entered By: Lorrin Jackson on 09/25/2020 14:06:43 -------------------------------------------------------------------------------- Wound Assessment Details Patient Name: Date of Service: MCKENZEE, BEEM 09/25/2020 1:15 PM Medical Record Number: [id number] Patient Account Number: [id number] Date of Birth/Sex: Treating RN: [birth year] (71 y.o. Female) Baruch Gouty Primary Care Eldene Plocher: Shawnie Dapper Other Clinician: Referring Kamala Kolton: Treating Zahrah Sutherlin/Extender: Larna Daughters Weeks in Treatment: 0 Wound Status Wound Number: 1 Primary Pressure Ulcer Etiology: Wound Location: Right, Medial Upper Leg Wound Open Wounding Event: Pressure Injury Status: Date Acquired: 07/10/2020 Comorbid Cataracts, Anemia, Asthma, Sleep Apnea, Hypertension, Type II Weeks Of Treatment: 0 History: Diabetes, Osteoarthritis, Neuropathy, Received Chemotherapy, Clustered Wound: No Received Radiation, Confinement Anxiety Photos Wound Measurements Length: (cm) 2.2 Width: (cm) 1.4 Depth: (cm) 0.1 Area: (cm) 2.419 Volume: (cm) 0.242 % Reduction in Area: 0% % Reduction in Volume: 0% Epithelialization: None Tunneling: No Undermining: No Wound Description Classification: Category/Stage III Wound Margin: Epibole Exudate Amount: Medium Exudate Type: Serosanguineous Exudate Color: red, brown Foul  Odor After Cleansing: No Slough/Fibrino No Wound Bed Granulation Amount: Large (67-100%) Exposed Structure Granulation Quality: Red Fascia Exposed: No Necrotic Amount: Small (1-33%) Fat Layer (Subcutaneous Tissue) Exposed: Yes Necrotic Quality: Adherent Slough Tendon Exposed: No Muscle Exposed: No Joint Exposed: No Bone Exposed: No Treatment Notes Wound #1 (Upper Leg) Wound Laterality: Right, Medial Cleanser Peri-Wound Care Skin Prep Discharge Instruction: Use skin prep as directed Topical Primary Dressing KerraCel Ag Gelling Fiber Dressing, 2x2 in (silver alginate) Discharge Instruction: Apply silver alginate to wound bed as instructed Secondary Dressing ComfortFoam Border, 3x3 in (silicone border) Discharge Instruction: Apply over primary dressing as directed. Secured With Compression Wrap Compression Stockings Environmental education officer) Signed: 09/28/2020 6:32:01 PM By: Baruch Gouty RN, BSN Signed: 10/01/2020 8:38:46 AM By: Leane Call Previous Signature: 09/25/2020 5:33:29 PM Version By: Baruch Gouty RN, BSN Entered By: Leane Call on 09/28/2020 16:00:28 -------------------------------------------------------------------------------- Wound Assessment Details Patient Name: Date of Service: TACHA, MANNI 09/25/2020 1:15 PM Medical Record Number: [id number] Patient Account Number: [id number] Date of Birth/Sex: Treating RN: August 09, 1950 (70 y.o. Female) Baruch Gouty Primary Care Celestial Barnfield: Shawnie Dapper Other Clinician: Referring Delphia Kaylor: Treating Sarenity Ramaker/Extender: Larna Daughters Weeks in Treatment: 0 Wound Status Wound Number: 2 Primary Pressure Ulcer Etiology: Wound Location: Left Labia Wound Open Wounding Event: Pressure Injury Status: Date Acquired: 03/20/2020 Comorbid Cataracts, Anemia, Asthma, Sleep Apnea, Hypertension, Type II Weeks Of Treatment: 0 History: Diabetes, Osteoarthritis, Neuropathy, Received  Chemotherapy, Clustered Wound: No Received Radiation, Confinement Anxiety Photos Wound Measurements Length: (cm) 2 Width: (cm) 1.4 Depth: (cm) 0.2 Area: (cm) 2.199 Volume: (cm) 0.44 % Reduction in Area: 0% % Reduction in Volume: 0% Epithelialization: None Tunneling: No Undermining: No Wound Description Classification: Category/Stage III Wound Margin: Flat and Intact Exudate Amount: Medium Exudate Type: Serosanguineous Exudate Color: red, brown Foul Odor After Cleansing: No Slough/Fibrino No Wound Bed Granulation Amount: Large (67-100%) Exposed Structure Granulation Quality: Red Fascia Exposed: No Necrotic Amount: Small (1-33%) Fat Layer (Subcutaneous Tissue) Exposed: Yes Necrotic Quality: Adherent Slough Tendon Exposed: No Muscle Exposed: No Joint Exposed: No Bone Exposed: No Treatment Notes Wound #2 (Labia) Wound Laterality: Left Cleanser Peri-Wound Care Skin Prep Discharge Instruction: Use skin prep as directed Topical Primary Dressing KerraCel Ag Gelling Fiber Dressing, 2x2 in (silver alginate) Discharge Instruction: Apply silver alginate to  wound bed as instructed Secondary Dressing ComfortFoam Border, 3x3 in (silicone border) Discharge Instruction: Apply over primary dressing as directed. Secured With Compression Wrap Compression Stockings Environmental education officer) Signed: 09/28/2020 6:32:01 PM By: Baruch Gouty RN, BSN Signed: 10/01/2020 8:38:46 AM By: Leane Call Previous Signature: 09/25/2020 5:33:29 PM Version By: Baruch Gouty RN, BSN Entered By: Leane Call on 09/28/2020 16:01:06 -------------------------------------------------------------------------------- Vitals Details Patient Name: Date of Service: GILLIE, FLEITES 09/25/2020 1:15 PM Medical Record Number: [id number] Patient Account Number: [id number] Date of Birth/Sex: Treating RN: 04-Mar-1951 (70 y.o. Female) Baruch Gouty Primary Care Insiya Oshea: Shawnie Dapper Other Clinician: Referring Sumner Boesch: Treating Corrissa Martello/Extender: Tomie China in Treatment: 0 Vital Signs Time Taken: 13:35 Temperature (F): 98.3 Height (in): 61 Pulse (bpm): 108 Source: Stated Respiratory Rate (breaths/min): 20 Weight (lbs): 260 Blood Pressure (mmHg): 179/99 Source: Stated Capillary Blood Glucose (mg/dl): 125 Body Mass Index (BMI): 49.1 Reference Range: 80 - 120 mg / dl Notes glucose per pt report this am Electronic Signature(s) Signed: 09/25/2020 5:33:29 PM By: Baruch Gouty RN, BSN Entered By: Baruch Gouty on 09/25/2020 13:37:12

## 2020-09-25 NOTE — Progress Notes (Signed)
MILAROSE, SAVICH ([id number]) Visit Report for 09/25/2020 Chief Complaint Document Details Patient Name: Date of Service: [name], [name] 09/25/2020 1:15 PM Medical Record Number: [id number] Patient Account Number: [id number] Date of Birth/Sex: Treating RN: April 01, 1951 (70 y.o. Sue Lush Primary Care Provider: Shawnie Dapper Other Clinician: Referring Provider: Treating Provider/Extender: Tomie China in Treatment: 0 Information Obtained from: Patient Chief Complaint 09/25/2020; patient is here for review of unusual ulcers on the right upper inner thigh and the left posterior labia Electronic Signature(s) Signed: 09/25/2020 5:27:23 PM By: Linton Ham MD Entered By: Linton Ham on 09/25/2020 14:46:15 -------------------------------------------------------------------------------- Debridement Details Patient Name: Date of Service: Allison Shivers. 09/25/2020 1:15 PM Medical Record Number: [id number] Patient Account Number: [id number] Date of Birth/Sex: Treating RN: [birth year] (70 y.o. Elam Dutch Primary Care Provider: Shawnie Dapper Other Clinician: Referring Provider: Treating Provider/Extender: Tomie China in Treatment: 0 Debridement Performed for Assessment: Wound #1 Right,Medial Upper Leg Performed By: Clinician Baruch Gouty, RN Debridement Type: Chemical/Enzymatic/Mechanical Agent Used: sALINE and gauze Level of Consciousness (Pre-procedure): Awake and Alert Pre-procedure Verification/Time Out No Taken: Bleeding: None Response to Treatment: Procedure was tolerated well Level of Consciousness (Post- Awake and Alert procedure): Post Debridement Measurements of Total Wound Length: (cm) 2.2 Stage: Category/Stage III Width: (cm) 1.4 Depth: (cm) 0.1 Volume: (cm) 0.242 Character of Wound/Ulcer Post Debridement: Improved Post Procedure Diagnosis Same as Pre-procedure Electronic  Signature(s) Signed: 09/25/2020 5:27:23 PM By: Linton Ham MD Signed: 09/25/2020 5:33:29 PM By: Baruch Gouty RN, BSN Entered By: Baruch Gouty on 09/25/2020 16:43:36 -------------------------------------------------------------------------------- Debridement Details Patient Name: Date of Service: [name], [name] 09/25/2020 1:15 PM Medical Record Number: [id number] Patient Account Number: [id number] Date of Birth/Sex: Treating RN: October 02, 1950 (70 y.o. Elam Dutch Primary Care Provider: Shawnie Dapper Other Clinician: Referring Provider: Treating Provider/Extender: Tomie China in Treatment: 0 Debridement Performed for Assessment: Wound #2 Left Labia Performed By: Clinician Baruch Gouty, RN Debridement Type: Chemical/Enzymatic/Mechanical Agent Used: sALINE and gauze Level of Consciousness (Pre-procedure): Awake and Alert Pre-procedure Verification/Time Out No Taken: Bleeding: None Response to Treatment: Procedure was tolerated well Level of Consciousness (Post- Awake and Alert procedure): Post Debridement Measurements of Total Wound Length: (cm) 2 Stage: Category/Stage III Width: (cm) 1.4 Depth: (cm) 0.1 Volume: (cm) 0.22 Character of Wound/Ulcer Post Debridement: Improved Post Procedure Diagnosis Same as Pre-procedure Electronic Signature(s) Signed: 09/25/2020 5:27:23 PM By: Linton Ham MD Signed: 09/25/2020 5:33:29 PM By: Baruch Gouty RN, BSN Entered By: Baruch Gouty on 09/25/2020 16:44:37 -------------------------------------------------------------------------------- HPI Details Patient Name: Date of Service: Allison Porta E. 09/25/2020 1:15 PM Medical Record Number: [id number] Patient Account Number: [id number] Date of Birth/Sex: Treating RN: [birth year] (70 y.o. Sue Lush Primary Care Provider: Shawnie Dapper Other Clinician: Referring Provider: Treating Provider/Extender: Tomie China in Treatment: 0 History of Present Illness HPI Description: ADMISSION 09/25/2020 This is a 70 year old woman who was referred from her endocrinologist for "pressure ulcers" 1 of which is in the left posterior labia and the other in close proximity in the right upper thigh close to the gluteal fold. She has had the area on the labia since December and more recently picked up the area in the thigh. Both of these areas are in close proximity although they are not obviously mirror-image areas. She is able to tell me fairly definitively that she did not have skin lesions on this area that she had before the ulcers. She is still ambulatory. She has a  history of endometrial cancer but received seed implants for radiation rather than external beam radiation. She has a history of a right buttock ulcer and a left hip ulcer in 2021 which were stage II but these closed over. Past medical history includes type 2 diabetes, hyperlipidemia, hypertension, chronic low back pain, endometrial CA treated with seed implants and a hysterectomy, pressure ulcers as noted above. Electronic Signature(s) Signed: 09/25/2020 5:27:23 PM By: Linton Ham MD Entered By: Linton Ham on 09/25/2020 14:56:47 -------------------------------------------------------------------------------- Physical Exam Details Patient Name: Date of Service: [name], [name] 09/25/2020 1:15 PM Medical Record Number: [id number] Patient Account Number: [id number] Date of Birth/Sex: Treating RN: [birth year] (70 y.o. Sue Lush Primary Care Provider: Shawnie Dapper Other Clinician: Referring Provider: Treating Provider/Extender: Tomie China in Treatment: 0 Constitutional Patient is hypertensive.. Pulse regular and within target range for patient.Marland Kitchen Respirations regular, non-labored and within target range.. Temperature is normal and within the target range for the patient.Marland Kitchen Appears in no  distress.. Integumentary (Hair, Skin) No obvious skin lesions.. Notes Wound exam The areas in question are difficult to see. These include an area on the left posterior labia and the right upper inner thigh close to the gluteal fold. These areas are actually close together among the soft tissue folds in this area. They are very similar in appearance with some punched out depth. There is no erythema around this. Palpitation of the subcutaneous tissue does not show any obvious abnormality. There is no discoloration. Electronic Signature(s) Signed: 09/25/2020 5:27:23 PM By: Linton Ham MD Entered By: Linton Ham on 09/25/2020 14:58:12 -------------------------------------------------------------------------------- Physician Orders Details Patient Name: Date of Service: [name], [name] 09/25/2020 1:15 PM Medical Record Number: [id number] Patient Account Number: [id number] Date of Birth/Sex: Treating RN: [birth year] (70 y.o. Elam Dutch Primary Care Provider: Shawnie Dapper Other Clinician: Referring Provider: Treating Provider/Extender: Tomie China in Treatment: 0 Verbal / Phone Orders: No Diagnosis Coding Follow-up Appointments ppointment in 1 week. - with Dr. Dellia Nims Return A Bathing/ Shower/ Hygiene May shower and wash wound with soap and water. Off-Loading Turn and reposition every 2 hours Wound Treatment Wound #1 - Upper Leg Wound Laterality: Right, Medial Peri-Wound Care: Skin Prep (DME) (Generic) 1 x Per Day/15 Days Discharge Instructions: Use skin prep as directed Prim Dressing: KerraCel Ag Gelling Fiber Dressing, 2x2 in (silver alginate) (DME) (Generic) 1 x Per Day/15 Days ary Discharge Instructions: Apply silver alginate to wound bed as instructed Secondary Dressing: ComfortFoam Border, 3x3 in (silicone border) (DME) (Generic) 1 x Per Day/15 Days Discharge Instructions: Apply over primary dressing as directed. Wound #2 - Labia  Wound Laterality: Left Peri-Wound Care: Skin Prep (DME) (Generic) 1 x Per Day/15 Days Discharge Instructions: Use skin prep as directed Prim Dressing: KerraCel Ag Gelling Fiber Dressing, 2x2 in (silver alginate) (DME) (Generic) 1 x Per Day/15 Days ary Discharge Instructions: Apply silver alginate to wound bed as instructed Secondary Dressing: ComfortFoam Border, 3x3 in (silicone border) (DME) (Generic) 1 x Per Day/15 Days Discharge Instructions: Apply over primary dressing as directed. Electronic Signature(s) Signed: 09/25/2020 5:27:23 PM By: Linton Ham MD Signed: 09/25/2020 5:33:29 PM By: Baruch Gouty RN, BSN Entered By: Baruch Gouty on 09/25/2020 16:56:24 -------------------------------------------------------------------------------- Problem List Details Patient Name: Date of Service: [name], [name] 09/25/2020 1:15 PM Medical Record Number: [id number] Patient Account Number: [id number] Date of Birth/Sex: Treating RN: 05-10-1950 (70 y.o. Sue Lush Primary Care Provider: Shawnie Dapper Other Clinician: Referring Provider: Treating Provider/Extender: Larna Daughters  Weeks in Treatment: 0 Active Problems ICD-10 Encounter Code Description Active Date MDM Diagnosis L97.118 Non-pressure chronic ulcer of right thigh with other specified severity 09/25/2020 No Yes L98.498 Non-pressure chronic ulcer of skin of other sites with other specified severity 09/25/2020 No Yes Inactive Problems Resolved Problems Electronic Signature(s) Signed: 09/25/2020 5:27:23 PM By: Linton Ham MD Entered By: Linton Ham on 09/25/2020 14:44:29 -------------------------------------------------------------------------------- Progress Note Details Patient Name: Date of Service: Allison Shivers. 09/25/2020 1:15 PM Medical Record Number: [id number] Patient Account Number: [id number] Date of Birth/Sex: Treating RN: 03/28/1951 (70 y.o. Sue Lush Primary Care  Provider: Shawnie Dapper Other Clinician: Referring Provider: Treating Provider/Extender: Tomie China in Treatment: 0 Subjective Chief Complaint Information obtained from Patient 09/25/2020; patient is here for review of unusual ulcers on the right upper inner thigh and the left posterior labia History of Present Illness (HPI) ADMISSION 09/25/2020 This is a 70 year old woman who was referred from her endocrinologist for "pressure ulcers" 1 of which is in the left posterior labia and the other in close proximity in the right upper thigh close to the gluteal fold. She has had the area on the labia since December and more recently picked up the area in the thigh. Both of these areas are in close proximity although they are not obviously mirror-image areas. She is able to tell me fairly definitively that she did not have skin lesions on this area that she had before the ulcers. She is still ambulatory. She has a history of endometrial cancer but received seed implants for radiation rather than external beam radiation. She has a history of a right buttock ulcer and a left hip ulcer in 2021 which were stage II but these closed over. Past medical history includes type 2 diabetes, hyperlipidemia, hypertension, chronic low back pain, endometrial CA treated with seed implants and a hysterectomy, pressure ulcers as noted above. Patient History Information obtained from Patient. Allergies adhesive (Reaction: blisters), banana (Reaction: diarrhea, nausea), egg (Reaction: diarrhea, nausea), latex (Reaction: rash), Linzess (Reaction: diarrhea), Ozempic (Reaction: diarrhea), penicillin (Reaction: rash), pine oil (Reaction: swelling rash), rose oil (Reaction: swelling, rash) Family History Diabetes - Father,Siblings,Child, Lung Disease - Father, Stroke - Father, Thyroid Problems - Siblings,Mother, No family history of Cancer, Heart Disease, Hereditary Spherocytosis, Hypertension,  Kidney Disease, Seizures, Tuberculosis. Social History Never smoker, Marital Status - Married, Alcohol Use - Rarely, Drug Use - No History, Caffeine Use - Rarely. Medical History Eyes Patient has history of Cataracts Denies history of Glaucoma, Optic Neuritis Ear/Nose/Mouth/Throat Denies history of Chronic sinus problems/congestion, Middle ear problems Hematologic/Lymphatic Patient has history of Anemia Respiratory Patient has history of Asthma, Sleep Apnea - uses CPAP Denies history of Aspiration, Chronic Obstructive Pulmonary Disease (COPD), Pneumothorax, Tuberculosis Cardiovascular Patient has history of Hypertension Gastrointestinal Denies history of Cirrhosis , Colitis, Crohnoos, Hepatitis A, Hepatitis B, Hepatitis C Endocrine Patient has history of Type II Diabetes Denies history of Type I Diabetes Genitourinary Denies history of End Stage Renal Disease Immunological Denies history of Lupus Erythematosus, Raynaudoos, Scleroderma Integumentary (Skin) Denies history of History of Burn Musculoskeletal Patient has history of Osteoarthritis Denies history of Gout, Rheumatoid Arthritis, Osteomyelitis Neurologic Patient has history of Neuropathy Denies history of Dementia, Quadriplegia, Paraplegia, Seizure Disorder Oncologic Patient has history of Received Chemotherapy, Received Radiation Psychiatric Patient has history of Confinement Anxiety Denies history of Anorexia/bulimia Patient is treated with Insulin, Oral Agents. Blood sugar is tested. Hospitalization/Surgery History - ankle fracture surgery. - arthroscopic repair ACL. - bil knee replacements. -  breast biopsy. - carpal tunnel release left. - c- section. - DandC/hysteroscopy. - total hysterectomy with bil oophrectomy. - tubal ligation. Medical A Surgical History Notes nd Constitutional Symptoms (General Health) morbid obesity Cardiovascular hyperlipidemia Gastrointestinal GERD, colon polyp,  hemmorrhoids Endocrine hypothyroidism Genitourinary hx nephrolithiasis Musculoskeletal DDD lumbar Neurologic sciatica of left side Oncologic endometrial adenocarcinoma Review of Systems (ROS) Constitutional Symptoms (General Health) Denies complaints or symptoms of Fatigue, Fever, Chills, Marked Weight Change. Eyes Complains or has symptoms of Glasses / Contacts. Denies complaints or symptoms of Dry Eyes, Vision Changes. Ear/Nose/Mouth/Throat Denies complaints or symptoms of Chronic sinus problems or rhinitis. Respiratory Complains or has symptoms of Shortness of Breath. Denies complaints or symptoms of Chronic or frequent coughs. Cardiovascular Denies complaints or symptoms of Chest pain. Gastrointestinal Complains or has symptoms of Frequent diarrhea. Denies complaints or symptoms of Nausea, Vomiting. Endocrine Denies complaints or symptoms of Heat/cold intolerance. Genitourinary Denies complaints or symptoms of Frequent urination. Integumentary (Skin) Complains or has symptoms of Wounds - labia. Musculoskeletal Complains or has symptoms of Muscle Weakness. Denies complaints or symptoms of Muscle Pain. Neurologic Complains or has symptoms of Numbness/parasthesias. Psychiatric Complains or has symptoms of Claustrophobia. Denies complaints or symptoms of Suicidal. Objective Constitutional Patient is hypertensive.. Pulse regular and within target range for patient.Marland Kitchen Respirations regular, non-labored and within target range.. Temperature is normal and within the target range for the patient.Marland Kitchen Appears in no distress.. Vitals Time Taken: 1:35 PM, Height: 61 in, Source: Stated, Weight: 260 lbs, Source: Stated, BMI: 49.1, Temperature: 98.3 F, Pulse: 108 bpm, Respiratory Rate: 20 breaths/min, Blood Pressure: 179/99 mmHg, Capillary Blood Glucose: 125 mg/dl. General Notes: glucose per pt report this am General Notes: Wound exam oo The areas in question are difficult to see.  These include an area on the left posterior labia and the right upper inner thigh close to the gluteal fold. These areas are actually close together among the soft tissue folds in this area. They are very similar in appearance with some punched out depth. There is no erythema around this. Palpitation of the subcutaneous tissue does not show any obvious abnormality. There is no discoloration. Integumentary (Hair, Skin) No obvious skin lesions.. Wound #1 status is Open. Original cause of wound was Pressure Injury. The date acquired was: 07/10/2020. The wound is located on the Right,Medial Upper Leg. The wound measures 2.2cm length x 1.4cm width x 0.1cm depth; 2.419cm^2 area and 0.242cm^3 volume. There is Fat Layer (Subcutaneous Tissue) exposed. There is no tunneling or undermining noted. There is a medium amount of serosanguineous drainage noted. The wound margin is epibole. There is large (67- 100%) red granulation within the wound bed. There is a small (1-33%) amount of necrotic tissue within the wound bed including Adherent Slough. Wound #2 status is Open. Original cause of wound was Pressure Injury. The date acquired was: 03/20/2020. The wound is located on the Left Labia. The wound measures 2cm length x 1.4cm width x 0.2cm depth; 2.199cm^2 area and 0.44cm^3 volume. There is Fat Layer (Subcutaneous Tissue) exposed. There is no tunneling or undermining noted. There is a medium amount of serosanguineous drainage noted. The wound margin is flat and intact. There is large (67-100%) red granulation within the wound bed. There is a small (1-33%) amount of necrotic tissue within the wound bed including Adherent Slough. 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 There was a Chemical/Enzymatic/Mechanical debridement performed by Baruch Gouty, RN.. Other agent used was sALINE and gauze. There was no bleeding. The procedure was tolerated well. Post Debridement Measurements: 2.2cm length x 1.4cm width x 0.1cm depth; 0.242cm^3 volume. Post debridement Stage noted as Category/Stage III. Character of Wound/Ulcer Post Debridement is improved. Post procedure Diagnosis Wound #1: Same as Pre-Procedure Wound #2 Pre-procedure diagnosis of Wound #2 is a Pressure Ulcer located on the Left Labia . There was a Chemical/Enzymatic/Mechanical debridement performed by Baruch Gouty, RN.. Other agent used was sALINE and gauze. There was no bleeding. The procedure was tolerated well. Post Debridement Measurements: 2cm length x 1.4cm width x 0.1cm depth; 0.22cm^3 volume. Post debridement Stage noted as Category/Stage III. Character of Wound/Ulcer Post Debridement is improved. Post procedure Diagnosis Wound #2: Same as Pre-Procedure Plan Follow-up Appointments: Return Appointment in 1 week. - with Dr. Dellia Nims Bathing/ Shower/ Hygiene: May shower and wash wound with soap and water. Off-Loading: Turn and reposition every 2 hours WOUND #1: - Upper Leg Wound Laterality: Right, Medial Peri-Wound Care: Skin Prep (DME) (Generic) 1 x Per Day/15 Days Discharge Instructions: Use skin prep as directed Prim Dressing: KerraCel Ag Gelling Fiber Dressing, 2x2 in (silver alginate) (DME) (Generic) 1 x Per Day/15 Days ary Discharge Instructions: Apply silver alginate to wound bed as instructed Secondary Dressing: ComfortFoam Border, 3x3 in (silicone border) (DME) (Generic) 1 x Per Day/15 Days Discharge Instructions: Apply over primary dressing as directed. WOUND #2: - Labia Wound Laterality: Left Peri-Wound Care: Skin Prep (DME) (Generic) 1 x Per Day/15 Days Discharge Instructions: Use skin prep as directed Prim Dressing: KerraCel Ag Gelling Fiber Dressing, 2x2 in (silver  alginate) (DME) (Generic) 1 x Per Day/15 Days ary Discharge Instructions: Apply silver alginate to wound bed as instructed Secondary Dressing: ComfortFoam Border, 3x3 in (silicone border) (DME) (Generic) 1 x Per Day/15 Days Discharge Instructions: Apply over primary dressing as directed. 1. These are very unusual positions for pressure ulcers as they are not over bony prominences. Nevertheless these may be pressure and friction areas from closely opposed pannus folds with wetness, maceration etc. 2. The 2 wounds in close juxtaposition to each other makes me wonder whether these are "pannus" fold wounds. Although these are not mirror-image areas they are in close proximity and I wonder in her day what is putting pressure on these areas including incontinence garments, underwear, etc. These would be in close folds that are sweaty and easy to keep moist. 3. I am going to use silver alginate on these areas we used foam borders to keep the dressings in place and we have asked them to keep these areas separated with either rolled face gauze, rolled ABDs. If these get wet and moist the dressings may need to be changed more than once a day. 4. If these deteriorate especially rapidly I think we would be looking at a biopsy. 5. Please note in your review of this note I have listed these as nonpressure as there is nothing in I CD10 that lets me describe a pressure ulcer in this atypical situation. I am aware that the nurses put pressure and I agree with this I spent 40 minutes in review of this patient's past medical history, face-to-face evaluation and preparation of this record Electronic Signature(s) Signed: 09/25/2020 5:27:23 PM By: Linton Ham MD Signed: 09/25/2020 5:33:29 PM By: Baruch Gouty RN, BSN Entered By: Baruch Gouty on 09/25/2020 16:56:33 -------------------------------------------------------------------------------- HxROS Details  Patient Name: Date of Service: [name], [name]  09/25/2020 1:15 PM Medical Record Number: [id number] Patient Account Number: [id number] Date of Birth/Sex: Treating RN: [birth year] (70 y.o. Elam Dutch Primary Care Provider: Shawnie Dapper Other Clinician: Referring Provider: Treating Provider/Extender: Tomie China in Treatment: 0 Information Obtained From Patient Constitutional Symptoms (General Health) Complaints and Symptoms: Negative for: Fatigue; Fever; Chills; Marked Weight Change Medical History: Past Medical History Notes: morbid obesity Eyes Complaints and Symptoms: Positive for: Glasses / Contacts Negative for: Dry Eyes; Vision Changes Medical History: Positive for: Cataracts Negative for: Glaucoma; Optic Neuritis Ear/Nose/Mouth/Throat Complaints and Symptoms: Negative for: Chronic sinus problems or rhinitis Medical History: Negative for: Chronic sinus problems/congestion; Middle ear problems Respiratory Complaints and Symptoms: Positive for: Shortness of Breath Negative for: Chronic or frequent coughs Medical History: Positive for: Asthma; Sleep Apnea - uses CPAP Negative for: Aspiration; Chronic Obstructive Pulmonary Disease (COPD); Pneumothorax; Tuberculosis Cardiovascular Complaints and Symptoms: Negative for: Chest pain Medical History: Positive for: Hypertension Past Medical History Notes: hyperlipidemia Gastrointestinal Complaints and Symptoms: Positive for: Frequent diarrhea Negative for: Nausea; Vomiting Medical History: Negative for: Cirrhosis ; Colitis; Crohns; Hepatitis A; Hepatitis B; Hepatitis C Past Medical History Notes: GERD, colon polyp, hemmorrhoids Endocrine Complaints and Symptoms: Negative for: Heat/cold intolerance Medical History: Positive for: Type II Diabetes Negative for: Type I Diabetes Past Medical History Notes: hypothyroidism Time with diabetes: since 1992 Treated with: Insulin, Oral agents Blood sugar tested every day: Yes Tested  : has libre system Genitourinary Complaints and Symptoms: Negative for: Frequent urination Medical History: Negative for: End Stage Renal Disease Past Medical History Notes: hx nephrolithiasis Integumentary (Skin) Complaints and Symptoms: Positive for: Wounds - labia Medical History: Negative for: History of Burn Musculoskeletal Complaints and Symptoms: Positive for: Muscle Weakness Negative for: Muscle Pain Medical History: Positive for: Osteoarthritis Negative for: Gout; Rheumatoid Arthritis; Osteomyelitis Past Medical History Notes: DDD lumbar Neurologic Complaints and Symptoms: Positive for: Numbness/parasthesias Medical History: Positive for: Neuropathy Negative for: Dementia; Quadriplegia; Paraplegia; Seizure Disorder Past Medical History Notes: sciatica of left side Psychiatric Complaints and Symptoms: Positive for: Claustrophobia Negative for: Suicidal Medical History: Positive for: Confinement Anxiety Negative for: Anorexia/bulimia Hematologic/Lymphatic Medical History: Positive for: Anemia Immunological Medical History: Negative for: Lupus Erythematosus; Raynauds; Scleroderma Oncologic Medical History: Positive for: Received Chemotherapy; Received Radiation Past Medical History Notes: endometrial adenocarcinoma HBO Extended History Items Eyes: Cataracts Immunizations Pneumococcal Vaccine: Received Pneumococcal Vaccination: Yes Implantable Devices No devices added Hospitalization / Surgery History Type of Hospitalization/Surgery ankle fracture surgery arthroscopic repair ACL bil knee replacements breast biopsy carpal tunnel release left c-section DandC/hysteroscopy total hysterectomy with bil oophrectomy tubal ligation Family and Social History Cancer: No; Diabetes: Yes - Father,Siblings,Child; Heart Disease: No; Hereditary Spherocytosis: No; Hypertension: No; Kidney Disease: No; Lung Disease: Yes - Father; Seizures: No; Stroke: Yes -  Father; Thyroid Problems: Yes - Siblings,Mother; Tuberculosis: No; Never smoker; Marital Status - Married; Alcohol Use: Rarely; Drug Use: No History; Caffeine Use: Rarely; Financial Concerns: No; Food, Clothing or Shelter Needs: No; Support System Lacking: No; Transportation Concerns: No Electronic Signature(s) Signed: 09/25/2020 5:27:23 PM By: Linton Ham MD Signed: 09/25/2020 5:33:29 PM By: Baruch Gouty RN, BSN Entered By: Baruch Gouty on 09/25/2020 16:48:29 -------------------------------------------------------------------------------- [place] Details Patient Name: Date of Service: Allison Shivers 09/25/2020 Medical Record Number: [id number] Patient Account Number: [id number] Date of Birth/Sex: Treating RN: 25-Dec-1950 (70 y.o. 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

## 2020-10-02 ENCOUNTER — Encounter (HOSPITAL_BASED_OUTPATIENT_CLINIC_OR_DEPARTMENT_OTHER): Payer: Medicare Other | Admitting: Internal Medicine

## 2020-10-07 ENCOUNTER — Ambulatory Visit: Payer: Medicare Other | Admitting: Family Medicine

## 2020-10-08 ENCOUNTER — Encounter (HOSPITAL_BASED_OUTPATIENT_CLINIC_OR_DEPARTMENT_OTHER): Payer: Medicare Other | Admitting: Internal Medicine

## 2020-10-08 ENCOUNTER — Other Ambulatory Visit: Payer: Self-pay

## 2020-10-08 DIAGNOSIS — L97118 Non-pressure chronic ulcer of right thigh with other specified severity: Secondary | ICD-10-CM | POA: Diagnosis not present

## 2020-10-08 DIAGNOSIS — L89890 Pressure ulcer of other site, unstageable: Secondary | ICD-10-CM | POA: Diagnosis not present

## 2020-10-08 DIAGNOSIS — Z90710 Acquired absence of both cervix and uterus: Secondary | ICD-10-CM | POA: Diagnosis not present

## 2020-10-08 DIAGNOSIS — L98498 Non-pressure chronic ulcer of skin of other sites with other specified severity: Secondary | ICD-10-CM | POA: Diagnosis not present

## 2020-10-08 DIAGNOSIS — Z91040 Latex allergy status: Secondary | ICD-10-CM | POA: Diagnosis not present

## 2020-10-08 DIAGNOSIS — Z8542 Personal history of malignant neoplasm of other parts of uterus: Secondary | ICD-10-CM | POA: Diagnosis not present

## 2020-10-08 DIAGNOSIS — Z923 Personal history of irradiation: Secondary | ICD-10-CM | POA: Diagnosis not present

## 2020-10-08 NOTE — Progress Notes (Signed)
[name] ([id number]) Visit Report for 10/08/2020 HPI Details Patient Name: Date of Service: [name], [name] 10/08/2020 9:15 A M Medical Record Number: [id number] Patient Account Number: [id number] Date of Birth/Sex: Treating RN: June 17, 1950 (70 y.o. [name] Primary Care Provider: Shawnie Dapper Other Clinician: Referring Provider: Treating Provider/Extender: Ruffin Pyo in Treatment: 1 History of Present Illness HPI Description: ADMISSION 09/25/2020 This is a 70 year old woman who was referred from her endocrinologist for "pressure ulcers" 1 of which is in the left posterior labia and the other in close proximity in the right upper thigh close to the gluteal fold. She has had the area on the labia since December and more recently picked up the area in the thigh. Both of these areas are in close proximity although they are not obviously mirror-image areas. She is able to tell me fairly definitively that she did not have skin lesions on this area that she had before the ulcers. She is still ambulatory. She has a history of endometrial cancer but received seed implants for radiation rather than external beam radiation. She has a history of a right buttock ulcer and a left hip ulcer in 2021 which were stage II but these closed over. Past medical history includes type 2 diabetes, hyperlipidemia, hypertension, chronic low back pain, endometrial CA treated with seed implants and a hysterectomy, pressure ulcers as noted above. 6/30; patient has 2 areas one is on the left posterior labia and the other in close proximity to the labia wound on the right upper thigh close to the gluteal fold. We have been using silver alginate and foam borders that are changing this at home. We counseled about offloading this area i.e. I cannot see her sitting for any length of time Electronic Signature(s) Signed: 10/08/2020 5:19:12 PM By: Linton Ham MD Entered By: Linton Ham on 10/08/2020 11:23:50 -------------------------------------------------------------------------------- Physical Exam Details Patient Name: Date of Service: [name] 10/08/2020 9:15 A M Medical Record Number: [id number] Patient Account Number: [id number] Date of Birth/Sex: Treating RN: Apr 21, 1950 (70 y.o. [name] Primary Care Provider: Shawnie Dapper Other Clinician: Referring Provider: Treating Provider/Extender: Ruffin Pyo in Treatment: 1 Constitutional Patient is hypertensive.. Pulse regular and within target range for patient.Marland Kitchen Respirations regular, non-labored and within target range.. Temperature is normal and within the target range for the patient.Marland Kitchen Appears in no distress. Notes Wound exam The areas are both measuring smaller. The area on the posterior thigh actually has a nice rim of epithelialization on the right. This looks better. The area on the posterior late labia is still punched out with depth although it is still measuring smaller Electronic Signature(s) Signed: 10/08/2020 5:19:12 PM By: Linton Ham MD Entered By: Linton Ham on 10/08/2020 11:24:43 -------------------------------------------------------------------------------- Physician Orders Details Patient Name: Date of Service: [name]. 10/08/2020 9:15 A M Medical Record Number: [id number] Patient Account Number: [id number] Date of Birth/Sex: Treating RN: 03/07/1951 (70 y.o. [name] Primary Care Provider: Shawnie Dapper Other Clinician: Referring Provider: Treating Provider/Extender: Ruffin Pyo in Treatment: 1 Verbal / Phone Orders: No Diagnosis Coding Follow-up Appointments ppointment in 2 weeks. - Dr. Dellia Nims Return A Bathing/ Shower/ Hygiene May shower and wash wound with soap and water. Off-Loading Turn and reposition every 2 hours Wound Treatment Wound #1 - Upper Leg Wound Laterality: Right,  Medial Peri-Wound Care: Skin Prep (Generic) 1 x Per Day/15 Days Discharge Instructions: Use skin prep as directed Prim Dressing: KerraCel Ag  Gelling Fiber Dressing, 2x2 in (silver alginate) (Generic) 1 x Per Day/15 Days ary Discharge Instructions: Apply silver alginate to wound bed as instructed Secondary Dressing: ComfortFoam Border, 3x3 in (silicone border) (Generic) 1 x Per Day/15 Days Discharge Instructions: Apply over primary dressing as directed. Wound #2 - Labia Wound Laterality: Left Peri-Wound Care: Skin Prep (Generic) 1 x Per Day/15 Days Discharge Instructions: Use skin prep as directed Prim Dressing: KerraCel Ag Gelling Fiber Dressing, 2x2 in (silver alginate) (Generic) 1 x Per Day/15 Days ary Discharge Instructions: Apply silver alginate to wound bed as instructed Secondary Dressing: ComfortFoam Border, 3x3 in (silicone border) (Generic) 1 x Per Day/15 Days Discharge Instructions: Apply over primary dressing as directed. Electronic Signature(s) Signed: 10/08/2020 5:19:12 PM By: Linton Ham MD Signed: 10/08/2020 6:57:58 PM By: Deon Pilling Entered By: Deon Pilling on 10/08/2020 11:02:22 -------------------------------------------------------------------------------- Problem List Details Patient Name: Date of Service: NELTA, CAUDILL 10/08/2020 9:15 A M Medical Record Number: [id number] Patient Account Number: [id number] Date of Birth/Sex: Treating RN: Jul 01, 1950 (70 y.o. 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 [name] Primary Care Provider: Shawnie Dapper Other Clinician: Referring Provider: Treating Provider/Extender: Ruffin Pyo in Treatment: 1 Subjective History of Present Illness (HPI) ADMISSION 09/25/2020 This is a 70 year old woman who was referred from her endocrinologist for "pressure ulcers" 1 of which is in the left posterior labia and the other in close proximity in the right upper thigh close to the gluteal fold. She has had the area on the labia since December and more recently picked up the area in the thigh. Both of these areas are in close proximity although they are not obviously mirror-image areas. She is able to tell me fairly definitively that she did not have skin lesions on this area that she had before the ulcers. She is still ambulatory. She has a history of endometrial cancer but received seed implants for radiation rather than external beam radiation. She has a history of a right buttock ulcer and a left hip ulcer in 2021 which were stage II but these closed over. Past medical history includes type 2 diabetes, hyperlipidemia, hypertension, chronic low back pain, endometrial CA treated with seed implants and a hysterectomy, pressure ulcers as noted above. 6/30; patient has 2 areas one is on the left posterior labia and the other in close proximity to the labia wound on the right upper thigh close to the gluteal fold. We have been using silver alginate and foam borders that are changing this at home. We counseled about  offloading this area i.e. I cannot see her sitting for any length of time Objective Constitutional Patient is hypertensive.. Pulse regular and within target range for patient.Marland Kitchen Respirations regular, non-labored and within target range.. Temperature is normal and within the target range for the patient.Marland Kitchen Appears in no distress. Vitals Time Taken: 10:12 AM,  Height: 61 in, Source: Stated, Weight: 260 lbs, Source: Stated, BMI: 49.1, Temperature: 97.8 F, Pulse: 79 bpm, Respiratory Rate: 20 breaths/min, Blood Pressure: 169/85 mmHg, Capillary Blood Glucose: 106 mg/dl. General Notes: glucose per pt report this am General Notes: Wound exam oo The areas are both measuring smaller. The area on the posterior thigh actually has a nice rim of epithelialization on the right. This looks better. The area on the posterior late labia is still punched out with depth although it is still measuring smaller Integumentary (Hair, Skin) Wound #1 status is Open. Original cause of wound was Pressure Injury. The date acquired was: 07/10/2020. The wound has been in treatment 1 weeks. The wound is located on the Right,Medial Upper Leg. The wound measures 1.6cm length x 0.6cm width x 0.1cm depth; 0.754cm^2 area and 0.075cm^3 volume. There is Fat Layer (Subcutaneous Tissue) exposed. There is no tunneling or undermining noted. There is a medium amount of serosanguineous drainage noted. The wound margin is flat and intact. There is large (67-100%) red granulation within the wound bed. There is a small (1-33%) amount of necrotic tissue within the wound bed including Adherent Slough. Wound #2 status is Open. Original cause of wound was Pressure Injury. The date acquired was: 03/20/2020. The wound has been in treatment 1 weeks. The wound is located on the Left Labia. The wound measures 2.3cm length x 0.9cm width x 0.1cm depth; 1.626cm^2 area and 0.163cm^3 volume. There is Fat Layer (Subcutaneous Tissue) exposed. There is no tunneling  or undermining noted. There is a medium amount of serosanguineous drainage noted. The wound margin is flat and intact. There is large (67-100%) red granulation within the wound bed. There is a small (1-33%) amount of necrotic tissue within the wound bed including Adherent Slough. 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 - Dr. Dellia Nims Bathing/ Shower/ Hygiene: May shower and wash wound with soap and water. Off-Loading: Turn and reposition every 2 hours WOUND #1: - Upper Leg Wound Laterality: Right, Medial Peri-Wound Care: Skin Prep (Generic) 1 x Per Day/15 Days Discharge Instructions: Use skin prep as directed Prim Dressing: KerraCel Ag Gelling Fiber Dressing, 2x2 in (silver alginate) (Generic) 1 x Per Day/15 Days ary Discharge Instructions: Apply silver alginate to wound bed as instructed Secondary Dressing: ComfortFoam Border, 3x3 in (silicone border) (Generic) 1 x Per Day/15 Days Discharge Instructions: Apply over primary dressing as directed. WOUND #2: - Labia Wound Laterality: Left Peri-Wound Care: Skin Prep (Generic) 1 x Per Day/15 Days Discharge Instructions: Use skin prep as directed Prim Dressing: KerraCel Ag Gelling Fiber Dressing, 2x2 in (silver alginate) (Generic) 1 x Per Day/15 Days ary Discharge Instructions: Apply silver alginate to wound bed as instructed Secondary Dressing: ComfortFoam Border, 3x3 in (silicone border) (Generic) 1 x Per Day/15 Days Discharge Instructions: Apply over primary dressing as directed. 1. Silver alginate to both wound areas. They are measuring smaller. 2. The area on the right posterior thigh actually looks a lot better with a nice rim of epithelialization. The left posterior labia wound also measuring smaller but not quite as filled in is  the other wound. 3. I could see using collagen on the labial area to see if we can get some filling in of the depth of this. Electronic Signature(s) Signed: 10/08/2020 5:19:12 PM By: Linton Ham MD Entered By: Linton Ham on 10/08/2020 11:25:59 --------------------------------------------------------------------------------  [facility] Details Patient Name: Date of Service: ANNALISA, COLONNA 10/08/2020 Medical Record Number: [id number] Patient Account Number: [id number] Date of Birth/Sex: Treating RN: January 08, 1951 (70 y.o. 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

## 2020-10-09 DIAGNOSIS — D649 Anemia, unspecified: Secondary | ICD-10-CM

## 2020-10-09 HISTORY — DX: Anemia, unspecified: D64.9

## 2020-10-13 NOTE — Progress Notes (Signed)
KRISTI, NORMENT ([id number]) Visit Report for 10/08/2020 Arrival Information Details Patient Name: Date of Service: [name], [name] 10/08/2020 9:15 A M Medical Record Number: [id number] Patient Account Number: [id number] Date of Birth/Sex: Treating RN: [birth year] (70 y.o. Elam Dutch Primary Care Velton Roselle: Shawnie Dapper Other Clinician: Referring Suhayla Chisom: Treating Laketra Bowdish/Extender: Ruffin Pyo in Treatment: 1 Visit Information History Since Last Visit Added or deleted any medications: No Patient Arrived: Wheel Chair Any new allergies or adverse reactions: No Arrival Time: 10:08 Had a fall or experienced change in No Accompanied By: spouse activities of daily living that may affect Transfer Assistance: None risk of falls: Patient Identification Verified: Yes Signs or symptoms of abuse/neglect since last visito No Secondary Verification Process Completed: Yes Hospitalized since last visit: No Patient Requires Transmission-Based Precautions: No Implantable device outside of the clinic excluding No Patient Has Alerts: No cellular tissue based products placed in the center since last visit: Has Dressing in Place as Prescribed: Yes Pain Present Now: Yes Electronic Signature(s) Signed: 10/08/2020 6:41:14 PM By: Baruch Gouty RN, BSN Entered By: Baruch Gouty on 10/08/2020 10:12:00 -------------------------------------------------------------------------------- Clinic Level of Care Assessment Details Patient Name: Date of Service: [name], [name] 10/08/2020 9:15 A M Medical Record Number: [id number] Patient Account Number: [id number] Date of Birth/Sex: Treating RN: 01/29/1951 (70 y.o. Helene Shoe, Meta.Reding Primary Care Aijalon Kirtz: Shawnie Dapper Other Clinician: Referring Tayia Stonesifer: Treating Marili Vader/Extender: Ruffin Pyo in Treatment: 1 Clinic Level of Care Assessment Items TOOL 4 Quantity Score X- 1 0 Use when only  an EandM is performed on FOLLOW-UP visit ASSESSMENTS - Nursing Assessment / Reassessment X- 1 10 Reassessment of Co-morbidities (includes updates in patient status) X- 1 5 Reassessment of Adherence to Treatment Plan ASSESSMENTS - Wound and Skin A ssessment / Reassessment []  - 0 Simple Wound Assessment / Reassessment - one wound X- 2 5 Complex Wound Assessment / Reassessment - multiple wounds X- 1 10 Dermatologic / Skin Assessment (not related to wound area) ASSESSMENTS - Focused Assessment []  - 0 Circumferential Edema Measurements - multi extremities X- 1 10 Nutritional Assessment / Counseling / Intervention []  - 0 Lower Extremity Assessment (monofilament, tuning fork, pulses) []  - 0 Peripheral Arterial Disease Assessment (using hand held doppler) ASSESSMENTS - Ostomy and/or Continence Assessment and Care []  - 0 Incontinence Assessment and Management []  - 0 Ostomy Care Assessment and Management (repouching, etc.) PROCESS - Coordination of Care []  - 0 Simple Patient / Family Education for ongoing care X- 1 20 Complex (extensive) Patient / Family Education for ongoing care X- 1 10 Staff obtains Programmer, systems, Records, T Results / Process Orders est []  - 0 Staff telephones HHA, Nursing Homes / Clarify orders / etc []  - 0 Routine Transfer to another Facility (non-emergent condition) []  - 0 Routine Hospital Admission (non-emergent condition) []  - 0 New Admissions / Biomedical engineer / Ordering NPWT Apligraf, etc. , []  - 0 Emergency Hospital Admission (emergent condition) []  - 0 Simple Discharge Coordination X- 1 15 Complex (extensive) Discharge Coordination PROCESS - Special Needs []  - 0 Pediatric / Minor Patient Management []  - 0 Isolation Patient Management []  - 0 Hearing / Language / Visual special needs []  - 0 Assessment of Community assistance (transportation, D/C planning, etc.) []  - 0 Additional assistance / Altered mentation []  - 0 Support Surface(s)  Assessment (bed, cushion, seat, etc.) INTERVENTIONS - Wound Cleansing / Measurement []  - 0 Simple Wound Cleansing - one wound X- 2 5 Complex Wound Cleansing - multiple wounds  X- 1 5 Wound Imaging (photographs - any number of wounds) []  - 0 Wound Tracing (instead of photographs) []  - 0 Simple Wound Measurement - one wound X- 2 5 Complex Wound Measurement - multiple wounds INTERVENTIONS - Wound Dressings X - Small Wound Dressing one or multiple wounds 2 10 []  - 0 Medium Wound Dressing one or multiple wounds []  - 0 Large Wound Dressing one or multiple wounds []  - 0 Application of Medications - topical []  - 0 Application of Medications - injection INTERVENTIONS - Miscellaneous []  - 0 External ear exam []  - 0 Specimen Collection (cultures, biopsies, blood, body fluids, etc.) []  - 0 Specimen(s) / Culture(s) sent or taken to Lab for analysis []  - 0 Patient Transfer (multiple staff / Civil Service fast streamer / Similar devices) []  - 0 Simple Staple / Suture removal (25 or less) []  - 0 Complex Staple / Suture removal (26 or more) []  - 0 Hypo / Hyperglycemic Management (close monitor of Blood Glucose) []  - 0 Ankle / Brachial Index (ABI) - do not check if billed separately X- 1 5 Vital Signs Has the patient been seen at the hospital within the last three years: Yes Total Score: 140 Level Of Care: New/Established - Level 4 Electronic Signature(s) Signed: 10/08/2020 6:57:58 PM By: Deon Pilling Entered By: Deon Pilling on 10/08/2020 11:03:08 -------------------------------------------------------------------------------- Encounter Discharge Information Details Patient Name: Date of Service: [name] 10/08/2020 9:15 A M Medical Record Number: [id number] Patient Account Number: [id number] Date of Birth/Sex: Treating RN: 06/23/1950 (71 y.o. Nita Sells Primary Care Rodolfo Notaro: Shawnie Dapper Other Clinician: Referring Audreana Hancox: Treating Rosco Harriott/Extender: Ruffin Pyo in Treatment: 1 Encounter Discharge Information Items Discharge Condition: Stable Ambulatory Status: Ambulatory Discharge Destination: Home Transportation: Private Auto Accompanied By: alone Schedule Follow-up Appointment: No Clinical Summary of Care: Patient Declined Electronic Signature(s) Signed: 10/13/2020 6:17:43 PM By: Leane Call Entered By: Leane Call on 10/08/2020 12:57:51 -------------------------------------------------------------------------------- Lower Extremity Assessment Details Patient Name: Date of Service: [name], [name] 10/08/2020 9:15 A M Medical Record Number: [id number] Patient Account Number: [id number] Date of Birth/Sex: Treating RN: [birth year] (70 y.o. Elam Dutch Primary Care Reeya Bound: Shawnie Dapper Other Clinician: Referring Larsen Dungan: Treating Gerldine Suleiman/Extender: Ruffin Pyo in Treatment: 1 Electronic Signature(s) Signed: 10/08/2020 6:41:14 PM By: Baruch Gouty RN, BSN Entered By: Baruch Gouty on 10/08/2020 10:30:36 -------------------------------------------------------------------------------- Multi Wound Chart Details Patient Name: Date of Service: [name] 10/08/2020 9:15 A M Medical Record Number: [id number] Patient Account Number: [id number] Date of Birth/Sex: Treating RN: 01-04-1951 (70 y.o. Helene Shoe, Meta.Reding Primary Care Sayler Mickiewicz: Shawnie Dapper Other Clinician: Referring Jerald Hennington: Treating Agatha Duplechain/Extender: Ruffin Pyo in Treatment: 1 Vital Signs Height(in): 61 Capillary Blood Glucose(mg/dl): 106 Weight(lbs): 260 Pulse(bpm): 79 Body Mass Index(BMI): 49 Blood Pressure(mmHg): 169/85 Temperature(F): 97.8 Respiratory Rate(breaths/min): 20 Photos: [1:No Photos Right, Medial Upper Leg] [2:No Photos Left Labia] [N/A:N/A N/A] Wound Location: [1:Pressure Injury] [2:Pressure Injury] [N/A:N/A] Wounding Event: [1:Pressure  Ulcer] [2:Pressure Ulcer] [N/A:N/A] Primary Etiology: [1:Cataracts, Anemia, Asthma, Sleep] [2:Cataracts, Anemia, Asthma, Sleep] [N/A:N/A] Comorbid History: [1:Apnea, Hypertension, Type II Diabetes, Osteoarthritis, Neuropathy, Diabetes, Osteoarthritis, Neuropathy, Received Chemotherapy, Received Received Chemotherapy, Received Radiation, Confinement Anxiety 07/10/2020] [2:Apnea, Hypertension,  Type II Radiation, Confinement Anxiety 03/20/2020] [N/A:N/A] Date Acquired: [1:1] [2:1] [N/A:N/A] Weeks of Treatment: [1:Open] [2:Open] [N/A:N/A] Wound Status: [1:1.6x0.6x0.1] [2:2.3x0.9x0.1] [N/A:N/A] Measurements L x W x D (cm) [1:0.754] [2:1.626] [N/A:N/A] A (cm) : rea [1:0.075] [2:0.163] [N/A:N/A] Volume (cm) : [1:68.80%] [2:26.10%] [N/A:N/A] % Reduction in A rea: [1:69.00%] [  2:63.00%] [N/A:N/A] % Reduction in Volume: [1:Category/Stage III] [2:Category/Stage III] [N/A:N/A] Classification: [1:Medium] [2:Medium] [N/A:N/A] Exudate A mount: [1:Serosanguineous] [2:Serosanguineous] [N/A:N/A] Exudate Type: [1:red, brown] [2:red, brown] [N/A:N/A] Exudate Color: [1:Flat and Intact] [2:Flat and Intact] [N/A:N/A] Wound Margin: [1:Large (67-100%)] [2:Large (67-100%)] [N/A:N/A] Granulation A mount: [1:Red] [2:Red] [N/A:N/A] Granulation Quality: [1:Small (1-33%)] [2:Small (1-33%)] [N/A:N/A] Necrotic A mount: [1:Fat Layer (Subcutaneous Tissue): Yes Fat Layer (Subcutaneous Tissue): Yes N/A] Exposed Structures: [1:Fascia: No Tendon: No Muscle: No Joint: No Bone: No Small (1-33%)] [2:Fascia: No Tendon: No Muscle: No Joint: No Bone: No None] [N/A:N/A] Treatment Notes Electronic Signature(s) Signed: 10/08/2020 5:19:12 PM By: Linton Ham MD Signed: 10/08/2020 6:57:58 PM By: Deon Pilling Entered By: Linton Ham on 10/08/2020 11:23:00 -------------------------------------------------------------------------------- Multi-Disciplinary Care Plan Details Patient Name: Date of Service: [name]  10/08/2020 9:15 A M Medical Record Number: [id number] Patient Account Number: [id number] Date of Birth/Sex: Treating RN: 09-06-1950 (70 y.o. Helene Shoe, Tammi Klippel Primary Care Anayah Arvanitis: Shawnie Dapper Other Clinician: Referring Afrika Brick: Treating Ivry Pigue/Extender: Ruffin Pyo in Treatment: 1 Multidisciplinary Care Plan reviewed with physician Active Inactive Abuse / Safety / Falls / Self Care Management Nursing Diagnoses: Potential for falls Goals: Patient/caregiver will verbalize/demonstrate measures taken to prevent injury and/or falls Date Initiated: 09/25/2020 Target Resolution Date: 10/23/2020 Goal Status: Active Interventions: Assess fall risk on admission and as needed Assess impairment of mobility on admission and as needed per policy Notes: Wound/Skin Impairment Nursing Diagnoses: Impaired tissue integrity Goals: Patient/caregiver will verbalize understanding of skin care regimen Date Initiated: 09/25/2020 Target Resolution Date: 10/23/2020 Goal Status: Active Ulcer/skin breakdown will have a volume reduction of 30% by week 4 Date Initiated: 09/25/2020 Target Resolution Date: 10/23/2020 Goal Status: Active Interventions: Assess patient/caregiver ability to obtain necessary supplies Assess patient/caregiver ability to perform ulcer/skin care regimen upon admission and as needed Assess ulceration(s) every visit Provide education on ulcer and skin care Treatment Activities: Skin care regimen initiated : 09/25/2020 Topical wound management initiated : 09/25/2020 Notes: Electronic Signature(s) Signed: 10/08/2020 6:57:58 PM By: Deon Pilling Entered By: Deon Pilling on 10/08/2020 10:33:17 -------------------------------------------------------------------------------- Pain Assessment Details Patient Name: Date of Service: [name], [name] 10/08/2020 9:15 A M Medical Record Number: [id number] Patient Account Number: [id number] Date of  Birth/Sex: Treating RN: Oct 08, 1950 (70 y.o. Elam Dutch Primary Care Sakira Dahmer: Shawnie Dapper Other Clinician: Referring Jordin Dambrosio: Treating Mousa Prout/Extender: Ruffin Pyo in Treatment: 1 Active Problems Location of Pain Severity and Description of Pain Patient Has Paino No Site Locations With Dressing Change: No Duration of the Pain. Constant / Intermittento Intermittent Rate the pain. Current Pain Level: 0 Worst Pain Level: 5 Least Pain Level: 0 Character of Pain Describe the Pain: Aching Pain Management and Medication Current Pain Management: Other: reposition Is the Current Pain Management Adequate: Adequate How does your wound impact your activities of daily livingo Sleep: No Bathing: No Appetite: No Relationship With Others: No Bladder Continence: No Emotions: No Bowel Continence: No Work: No Toileting: No Drive: No Dressing: No Hobbies: No Electronic Signature(s) Signed: 10/08/2020 6:41:14 PM By: Baruch Gouty RN, BSN Entered By: Baruch Gouty on 10/08/2020 10:30:26 -------------------------------------------------------------------------------- Patient/Caregiver Education Details Patient Name: Date of Service: [name] 6/30/2022andnbsp9:15 A M Medical Record Number: [id number] Patient Account Number: [id number] Date of Birth/Gender: Treating RN: 06-26-1950 (70 y.o. Debby Bud Primary Care Physician: Shawnie Dapper Other Clinician: Referring Physician: Treating Physician/Extender: Ruffin Pyo in Treatment: 1 Education Assessment Education Provided To: Patient Education Topics Provided Wound/Skin Impairment: Handouts: Skin  Care Do's and Dont's Methods: Explain/Verbal Responses: Reinforcements needed Electronic Signature(s) Signed: 10/08/2020 6:57:58 PM By: Deon Pilling Entered By: Deon Pilling on 10/08/2020  10:33:29 -------------------------------------------------------------------------------- Wound Assessment Details Patient Name: Date of Service: [name], [name] 10/08/2020 9:15 A M Medical Record Number: [id number] Patient Account Number: [id number] Date of Birth/Sex: Treating RN: 10/28/1950 (70 y.o. Elam Dutch Primary Care Katerin Negrete: Shawnie Dapper Other Clinician: Referring Brysyn Brandenberger: Treating Keimani Laufer/Extender: Ruffin Pyo in Treatment: 1 Wound Status Wound Number: 1 Primary Pressure Ulcer Etiology: Wound Location: Right, Medial Upper Leg Wound Open Wounding Event: Pressure Injury Status: Date Acquired: 07/10/2020 Comorbid Cataracts, Anemia, Asthma, Sleep Apnea, Hypertension, Type II Weeks Of Treatment: 1 History: Diabetes, Osteoarthritis, Neuropathy, Received Chemotherapy, Clustered Wound: No Received Radiation, Confinement Anxiety Photos Wound Measurements Length: (cm) 1.6 Width: (cm) 0.6 Depth: (cm) 0.1 Area: (cm) 0.754 Volume: (cm) 0.075 % Reduction in Area: 68.8% % Reduction in Volume: 69% Epithelialization: Small (1-33%) Tunneling: No Undermining: No Wound Description Classification: Category/Stage III Wound Margin: Flat and Intact Exudate Amount: Medium Exudate Type: Serosanguineous Exudate Color: red, brown Foul Odor After Cleansing: No Slough/Fibrino No Wound Bed Granulation Amount: Large (67-100%) Exposed Structure Granulation Quality: Red Fascia Exposed: No Necrotic Amount: Small (1-33%) Fat Layer (Subcutaneous Tissue) Exposed: Yes Necrotic Quality: Adherent Slough Tendon Exposed: No Muscle Exposed: No Joint Exposed: No Bone Exposed: No Treatment Notes Wound #1 (Upper Leg) Wound Laterality: Right, Medial Cleanser Peri-Wound Care Skin Prep Discharge Instruction: Use skin prep as directed Topical Primary Dressing KerraCel Ag Gelling Fiber Dressing, 2x2 in (silver alginate) Discharge Instruction: Apply  silver alginate to wound bed as instructed Secondary Dressing ComfortFoam Border, 3x3 in (silicone border) Discharge Instruction: Apply over primary dressing as directed. Secured With Compression Wrap Compression Stockings Add-Ons Electronic Signature(s) Signed: 10/09/2020 10:25:49 AM By: Sandre Kitty Signed: 10/09/2020 1:24:03 PM By: Baruch Gouty RN, BSN Previous Signature: 10/08/2020 6:41:14 PM Version By: Baruch Gouty RN, BSN Entered By: Sandre Kitty on 10/09/2020 09:50:09 -------------------------------------------------------------------------------- Wound Assessment Details Patient Name: Date of Service: [name], [name] 10/08/2020 9:15 A M Medical Record Number: [id number] Patient Account Number: [id number] Date of Birth/Sex: Treating RN: 01-01-1951 (70 y.o. Elam Dutch Primary Care Janean Eischen: Shawnie Dapper Other Clinician: Referring Shuan Statzer: Treating Brianda Beitler/Extender: Ruffin Pyo in Treatment: 1 Wound Status Wound Number: 2 Primary Pressure Ulcer Etiology: Wound Location: Left Labia Wound Open Wounding Event: Pressure Injury Status: Date Acquired: 03/20/2020 Comorbid Cataracts, Anemia, Asthma, Sleep Apnea, Hypertension, Type II Weeks Of Treatment: 1 History: Diabetes, Osteoarthritis, Neuropathy, Received Chemotherapy, Clustered Wound: No Received Radiation, Confinement Anxiety Photos Wound Measurements Length: (cm) 2.3 Width: (cm) 0.9 Depth: (cm) 0.1 Area: (cm) 1.626 Volume: (cm) 0.163 % Reduction in Area: 26.1% % Reduction in Volume: 63% Epithelialization: None Tunneling: No Undermining: No Wound Description Classification: Category/Stage III Wound Margin: Flat and Intact Exudate Amount: Medium Exudate Type: Serosanguineous Exudate Color: red, brown Wound Bed Granulation Amount: Large (67-100%) Granulation Quality: Red Necrotic Amount: Small (1-33%) Necrotic Quality: Adherent Slough Foul Odor After  Cleansing: No Slough/Fibrino No Exposed Structure Fascia Exposed: No Fat Layer (Subcutaneous Tissue) Exposed: Yes Tendon Exposed: No Muscle Exposed: No Joint Exposed: No Bone Exposed: No Treatment Notes Wound #2 (Labia) Wound Laterality: Left Cleanser Peri-Wound Care Skin Prep Discharge Instruction: Use skin prep as directed Topical Primary Dressing KerraCel Ag Gelling Fiber Dressing, 2x2 in (silver alginate) Discharge Instruction: Apply silver alginate to wound bed as instructed Secondary Dressing ComfortFoam Border, 3x3 in (silicone border) Discharge Instruction: Apply over primary dressing  as directed. Secured With Compression Wrap Compression Stockings Add-Ons Electronic Signature(s) Signed: 10/09/2020 10:25:49 AM By: Sandre Kitty Signed: 10/09/2020 1:24:03 PM By: Baruch Gouty RN, BSN Previous Signature: 10/08/2020 6:41:14 PM Version By: Baruch Gouty RN, BSN Entered By: Sandre Kitty on 10/09/2020 09:50:42 -------------------------------------------------------------------------------- Vitals Details Patient Name: Date of Service: [name], [name] 10/08/2020 9:15 A M Medical Record Number: [id number] Patient Account Number: [id number] Date of Birth/Sex: Treating RN: Aug 26, 1950 (70 y.o. Elam Dutch Primary Care Jolaine Fryberger: Shawnie Dapper Other Clinician: Referring Kennis Wissmann: Treating Denora Wysocki/Extender: Ruffin Pyo in Treatment: 1 Vital Signs Time Taken: 10:12 Temperature (F): 97.8 Height (in): 61 Pulse (bpm): 79 Source: Stated Respiratory Rate (breaths/min): 20 Weight (lbs): 260 Blood Pressure (mmHg): 169/85 Source: Stated Capillary Blood Glucose (mg/dl): 106 Body Mass Index (BMI): 49.1 Reference Range: 80 - 120 mg / dl Notes glucose per pt report this am Electronic Signature(s) Signed: 10/08/2020 6:41:14 PM By: Baruch Gouty RN, BSN Entered By: Baruch Gouty on 10/08/2020 10:12:56

## 2020-10-15 ENCOUNTER — Other Ambulatory Visit: Payer: Self-pay | Admitting: Family Medicine

## 2020-10-20 ENCOUNTER — Other Ambulatory Visit: Payer: Self-pay

## 2020-10-20 ENCOUNTER — Encounter (HOSPITAL_BASED_OUTPATIENT_CLINIC_OR_DEPARTMENT_OTHER): Payer: Medicare Other | Attending: Internal Medicine | Admitting: Internal Medicine

## 2020-10-20 DIAGNOSIS — Z8542 Personal history of malignant neoplasm of other parts of uterus: Secondary | ICD-10-CM | POA: Insufficient documentation

## 2020-10-20 DIAGNOSIS — L97112 Non-pressure chronic ulcer of right thigh with fat layer exposed: Secondary | ICD-10-CM | POA: Diagnosis not present

## 2020-10-20 DIAGNOSIS — L98492 Non-pressure chronic ulcer of skin of other sites with fat layer exposed: Secondary | ICD-10-CM | POA: Diagnosis not present

## 2020-10-20 DIAGNOSIS — L98498 Non-pressure chronic ulcer of skin of other sites with other specified severity: Secondary | ICD-10-CM | POA: Insufficient documentation

## 2020-10-20 DIAGNOSIS — L97118 Non-pressure chronic ulcer of right thigh with other specified severity: Secondary | ICD-10-CM | POA: Diagnosis present

## 2020-10-21 NOTE — Progress Notes (Signed)
EZMA, REHM ([id number]) Visit Report for 10/20/2020 HPI Details Patient Name: Date of Service: [name], [name] 10/20/2020 12:30 PM Medical Record Number: [id number] Patient Account Number: [id number] Date of Birth/Sex: Treating RN: [birth year] (70 y.o. Tonita Phoenix, Lauren Primary Care Provider: Shawnie Dapper Other Clinician: Referring Provider: Treating Provider/Extender: Ruffin Pyo in Treatment: 3 History of Present Illness HPI Description: ADMISSION 09/25/2020 This is a 70 year old woman who was referred from her endocrinologist for "pressure ulcers" 1 of which is in the left posterior labia and the other in close proximity in the right upper thigh close to the gluteal fold. She has had the area on the labia since December and more recently picked up the area in the thigh. Both of these areas are in close proximity although they are not obviously mirror-image areas. She is able to tell me fairly definitively that she did not have skin lesions on this area that she had before the ulcers. She is still ambulatory. She has a history of endometrial cancer but received seed implants for radiation rather than external beam radiation. She has a history of a right buttock ulcer and a left hip ulcer in 2021 which were stage II but these closed over. Past medical history includes type 2 diabetes, hyperlipidemia, hypertension, chronic low back pain, endometrial CA treated with seed implants and a hysterectomy, pressure ulcers as noted above. 6/30; patient has 2 areas one is on the left posterior labia and the other in close proximity to the labia wound on the right upper thigh close to the gluteal fold. We have been using silver alginate and foam borders that are changing this at home. We counseled about offloading this area i.e. I cannot see her sitting for any length of time 10/20/2020; 2-week follow-up. The patient has an area on the left posterior labia and an area  on the right upper thigh in close proximity to the gluteal fold. I changed her to silver collagen last time. She is offloading this area. The patient is not really sure how these came to pass. She tells me that she has generalized plaque psoriasis but I do not know that this has anything to do with it even if you wanted to talk about inverse psoriasis. We have been using silver alginate with a foam cover Electronic Signature(s) Signed: 10/20/2020 5:25:56 PM By: Linton Ham MD Entered By: Linton Ham on 10/20/2020 13:37:55 -------------------------------------------------------------------------------- Physical Exam Details Patient Name: Date of Service: Allison Porta E. 10/20/2020 12:30 PM Medical Record Number: [id number] Patient Account Number: [id number] Date of Birth/Sex: Treating RN: 1951-02-03 (70 y.o. Tonita Phoenix, Lauren Primary Care Provider: Shawnie Dapper Other Clinician: Referring Provider: Treating Provider/Extender: Ruffin Pyo in Treatment: 3 Constitutional Sitting or standing Blood Pressure is within target range for patient.. Pulse regular and within target range for patient.Marland Kitchen Respirations regular, non-labored and within target range.. Temperature is normal and within the target range for the patient.Marland Kitchen Appears in no distress. Notes Wound exam Both of these measures smaller. They appear to be healthy healthy. I do not see any problems with the surrounding skin per se although it is somewhat erythematous but nontender and blanches easily Electronic Signature(s) Signed: 10/20/2020 5:25:56 PM By: Linton Ham MD Entered By: Linton Ham on 10/20/2020 13:36:10 -------------------------------------------------------------------------------- Physician Orders Details Patient Name: Date of Service: [name]. 10/20/2020 12:30 PM Medical Record Number: [id number] Patient Account Number: [id number] Date of Birth/Sex: Treating  RN: [birth year] (70 y.o. Tonita Phoenix, Lauren Primary  Care Provider: Shawnie Dapper Other Clinician: Referring Provider: Treating Provider/Extender: Ruffin Pyo in Treatment: 3 Verbal / Phone Orders: No Diagnosis Coding Follow-up Appointments ppointment in 2 weeks. - Tuesday w/ Dr. Dellia Nims Return A Bathing/ Shower/ Hygiene May shower and wash wound with soap and water. Off-Loading Turn and reposition every 2 hours Wound Treatment Wound #1 - Upper Leg Wound Laterality: Right, Medial Peri-Wound Care: Skin Prep (Generic) 1 x Per Day/15 Days Discharge Instructions: Use skin prep as directed Prim Dressing: KerraCel Ag Gelling Fiber Dressing, 2x2 in (silver alginate) (Generic) 1 x Per Day/15 Days ary Discharge Instructions: Apply silver alginate to wound bed as instructed Secondary Dressing: Bordered Gauze, 2x3.75 in (DME) (Generic) 1 x Per Day/15 Days Discharge Instructions: Apply over primary dressing as directed. Wound #2 - Labia Wound Laterality: Left Peri-Wound Care: Skin Prep (Generic) 1 x Per Day/15 Days Discharge Instructions: Use skin prep as directed Prim Dressing: KerraCel Ag Gelling Fiber Dressing, 2x2 in (silver alginate) (Generic) 1 x Per Day/15 Days ary Discharge Instructions: Apply silver alginate to wound bed as instructed Secondary Dressing: Bordered Gauze, 2x3.75 in (DME) (Generic) 1 x Per Day/15 Days Discharge Instructions: Apply over primary dressing as directed. Electronic Signature(s) Signed: 10/20/2020 5:25:56 PM By: Linton Ham MD Signed: 10/21/2020 6:12:45 PM By: Rhae Hammock RN Entered By: Rhae Hammock on 10/20/2020 13:35:37 -------------------------------------------------------------------------------- Problem List Details Patient Name: Date of Service: [name] 10/20/2020 12:30 PM Medical Record Number: [id number] Patient Account Number: [id number] Date of Birth/Sex: Treating RN: 04-27-1950 (70 y.o. 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 10/20/2020 12:30 PM Medical Record Number: [id number] Patient Account Number: [id number] Date of Birth/Sex: Treating RN: 1950-08-10 (70 y.o. Tonita Phoenix, Lauren Primary Care Provider: Shawnie Dapper Other Clinician: Referring Provider: Treating Provider/Extender: Ruffin Pyo in Treatment: 3 Subjective History of Present Illness (HPI) ADMISSION 09/25/2020 This is a 70 year old woman who was referred from her endocrinologist for "pressure ulcers" 1 of which is in the left posterior labia and the other in close proximity in the right upper thigh close to the gluteal fold. She has had the area on the labia since December and more recently picked up the area in the thigh. Both of these areas are in close proximity although they are not obviously mirror-image areas. She is able to tell me fairly definitively that she did not have skin lesions on this area that she had before the ulcers. She is still ambulatory. She has a history of endometrial cancer but received seed implants for radiation rather than external beam radiation. She has a history of a right buttock ulcer and a left hip ulcer in 2021 which  were stage II but these closed over. Past medical history includes type 2 diabetes, hyperlipidemia, hypertension, chronic low back pain, endometrial CA treated with seed implants and a hysterectomy, pressure ulcers as noted above. 6/30; patient has 2 areas one is on the left posterior labia and the other in close proximity  to the labia wound on the right upper thigh close to the gluteal fold. We have been using silver alginate and foam borders that are changing this at home. We counseled about offloading this area i.e. I cannot see her sitting for any length of time 10/20/2020; 2-week follow-up. The patient has an area on the left posterior labia and an area on the right upper thigh in close proximity to the gluteal fold. I changed her to silver collagen last time. She is offloading this area. The patient is not really sure how these came to pass. She tells me that she has generalized plaque psoriasis but I do not know that this has anything to do with it even if you wanted to talk about inverse psoriasis Objective Constitutional Sitting or standing Blood Pressure is within target range for patient.. Pulse regular and within target range for patient.Marland Kitchen Respirations regular, non-labored and within target range.. Temperature is normal and within the target range for the patient.Marland Kitchen Appears in no distress. Vitals Time Taken: 12:51 PM, Height: 61 in, Weight: 260 lbs, BMI: 49.1, Temperature: 98.3 F, Pulse: 92 bpm, Respiratory Rate: 18 breaths/min, Blood Pressure: 143/74 mmHg, Capillary Blood Glucose: 104 mg/dl. General Notes: Wound exam oo Both of these measures smaller. They appear to be healthy healthy. I do not see any problems with the surrounding skin per se although it is somewhat erythematous but nontender and blanches easily Integumentary (Hair, Skin) Wound #1 status is Open. Original cause of wound was Pressure Injury. The date acquired was: 07/10/2020. The wound has been in treatment 3 weeks.  The wound is located on the Right,Medial Upper Leg. The wound measures 0.7cm length x 0.3cm width x 0.1cm depth; 0.165cm^2 area and 0.016cm^3 volume. There is Fat Layer (Subcutaneous Tissue) exposed. There is no tunneling or undermining noted. There is a medium amount of serosanguineous drainage noted. The wound margin is flat and intact. There is large (67-100%) red, pink granulation within the wound bed. There is no necrotic tissue within the wound bed. Wound #2 status is Open. Original cause of wound was Pressure Injury. The date acquired was: 03/20/2020. The wound has been in treatment 3 weeks. The wound is located on the Left Labia. The wound measures 1.5cm length x 0.3cm width x 0.1cm depth; 0.353cm^2 area and 0.035cm^3 volume. There is Fat Layer (Subcutaneous Tissue) exposed. There is no tunneling or undermining noted. There is a medium amount of serosanguineous drainage noted. The wound margin is flat and intact. There is large (67-100%) red granulation within the wound bed. There is no necrotic tissue within the wound bed. 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 - Tuesday w/ Dr. Dellia Nims Bathing/ Shower/ Hygiene: May shower and wash wound with soap and water. Off-Loading: Turn and reposition every 2 hours WOUND #1: - Upper Leg Wound Laterality: Right, Medial Peri-Wound Care: Skin Prep (Generic) 1 x Per Day/15 Days Discharge Instructions: Use skin prep as directed Prim Dressing: KerraCel Ag Gelling Fiber Dressing, 2x2 in (silver alginate) (Generic) 1 x Per Day/15 Days ary Discharge Instructions: Apply silver alginate to wound bed as instructed Secondary Dressing: Bordered Gauze, 2x3.75 in (DME) (Generic) 1 x Per Day/15 Days Discharge Instructions: Apply over primary dressing as  directed. WOUND #2: - Labia Wound Laterality: Left Peri-Wound Care: Skin Prep (Generic) 1 x Per QMV/78 Days Discharge Instructions: Use skin prep  as directed Prim Dressing: KerraCel Ag Gelling Fiber Dressing, 2x2 in (silver alginate) (Generic) 1 x Per Day/15 Days ary Discharge Instructions: Apply silver alginate to wound bed as instructed Secondary Dressing: Bordered Gauze, 2x3.75 in (DME) (Generic) 1 x Per Day/15 Days Discharge Instructions: Apply over primary dressing as directed. 1. Still using silver alginate 2. There seems very little to change here. 3. The wounds are contracting and appear to be epithelializing nicely. There is very little reason to be concerned enough to biopsy Electronic Signature(s) Signed: 10/20/2020 5:25:56 PM By: Linton Ham MD Entered By: Linton Ham on 10/20/2020 13:37:20 -------------------------------------------------------------------------------- SuperBill Details Patient Name: Date of Service: [name] 10/20/2020 Medical Record Number: [id number] Patient Account Number: [id number] Date of Birth/Sex: Treating RN: May 14, 1950 (70 y.o. 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 L98.498 Non-pressure chronic ulcer of skin of other sites with other specified severity Facility Procedures CPT4 Code: [id number] Description: 99214 - WOUND CARE VISIT-LEV 4 EST PT Modifier: Quantity: 1 Physician Procedures : CPT4 Code Description Modifier [id number] - WC PHYS LEVEL 3 - EST PT ICD-10 Diagnosis Description V74.718 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

## 2020-10-21 NOTE — Progress Notes (Signed)
[name], [name] ([id number]) Visit Report for 10/20/2020 Arrival Information Details Patient Name: Date of Service: [name], [name] 10/20/2020 12:30 PM Medical Record Number: [id number] Patient Account Number: [id number] Date of Birth/Sex: Treating RN: 1951-03-28 (70 y.o. Sue Lush Primary Care Santiago Stenzel: Shawnie Dapper Other Clinician: Referring Janyiah Silveri: Treating Anntonette Madewell/Extender: Ruffin Pyo in Treatment: 3 Visit Information History Since Last Visit Added or deleted any medications: No Patient Arrived: Wheel Chair Any new allergies or adverse reactions: No Arrival Time: 12:47 Had a fall or experienced change in No Accompanied By: spouse activities of daily living that may affect Transfer Assistance: None risk of falls: Patient Identification Verified: Yes Signs or symptoms of abuse/neglect since last visito No Secondary Verification Process Completed: Yes Hospitalized since last visit: No Patient Requires Transmission-Based Precautions: No Implantable device outside of the clinic excluding No Patient Has Alerts: No cellular tissue based products placed in the center since last visit: Has Dressing in Place as Prescribed: Yes Pain Present Now: No Electronic Signature(s) Signed: 10/20/2020 5:30:58 PM By: Lorrin Jackson Entered By: Lorrin Jackson on 10/20/2020 12:51:16 -------------------------------------------------------------------------------- Clinic Level of Care Assessment Details Patient Name: Date of Service: [name], [name] 10/20/2020 12:30 PM Medical Record Number: [id number] Patient Account Number: [id number] Date of Birth/Sex: Treating RN: Jan 27, 1951 (70 y.o. Tonita Phoenix, Lauren Primary Care Orlinda Slomski: Shawnie Dapper Other Clinician: Referring Kayron Kalmar: Treating Tushar Enns/Extender: Ruffin Pyo in Treatment: 3 Clinic Level of Care Assessment Items TOOL 4 Quantity Score X- 1 0 Use when only an EandM  is performed on FOLLOW-UP visit ASSESSMENTS - Nursing Assessment / Reassessment X- 1 10 Reassessment of Co-morbidities (includes updates in patient status) X- 1 5 Reassessment of Adherence to Treatment Plan ASSESSMENTS - Wound and Skin A ssessment / Reassessment X - Simple Wound Assessment / Reassessment - one wound 1 5 []  - 0 Complex Wound Assessment / Reassessment - multiple wounds X- 1 10 Dermatologic / Skin Assessment (not related to wound area) ASSESSMENTS - Focused Assessment []  - 0 Circumferential Edema Measurements - multi extremities []  - 0 Nutritional Assessment / Counseling / Intervention []  - 0 Lower Extremity Assessment (monofilament, tuning fork, pulses) []  - 0 Peripheral Arterial Disease Assessment (using hand held doppler) ASSESSMENTS - Ostomy and/or Continence Assessment and Care []  - 0 Incontinence Assessment and Management []  - 0 Ostomy Care Assessment and Management (repouching, etc.) PROCESS - Coordination of Care X - Simple Patient / Family Education for ongoing care 1 15 []  - 0 Complex (extensive) Patient / Family Education for ongoing care X- 1 10 Staff obtains Programmer, systems, Records, T Results / Process Orders est []  - 0 Staff telephones HHA, Nursing Homes / Clarify orders / etc []  - 0 Routine Transfer to another Facility (non-emergent condition) []  - 0 Routine Hospital Admission (non-emergent condition) []  - 0 New Admissions / Biomedical engineer / Ordering NPWT Apligraf, etc. , []  - 0 Emergency Hospital Admission (emergent condition) X- 1 10 Simple Discharge Coordination []  - 0 Complex (extensive) Discharge Coordination PROCESS - Special Needs []  - 0 Pediatric / Minor Patient Management []  - 0 Isolation Patient Management []  - 0 Hearing / Language / Visual special needs []  - 0 Assessment of Community assistance (transportation, D/C planning, etc.) []  - 0 Additional assistance / Altered mentation []  - 0 Support Surface(s)  Assessment (bed, cushion, seat, etc.) INTERVENTIONS - Wound Cleansing / Measurement []  - 0 Simple Wound Cleansing - one wound X- 2 5 Complex Wound Cleansing - multiple wounds X- 1  5 Wound Imaging (photographs - any number of wounds) []  - 0 Wound Tracing (instead of photographs) []  - 0 Simple Wound Measurement - one wound X- 2 5 Complex Wound Measurement - multiple wounds INTERVENTIONS - Wound Dressings []  - 0 Small Wound Dressing one or multiple wounds X- 2 15 Medium Wound Dressing one or multiple wounds []  - 0 Large Wound Dressing one or multiple wounds []  - 0 Application of Medications - topical X- 1 10 Application of Medications - injection INTERVENTIONS - Miscellaneous []  - 0 External ear exam []  - 0 Specimen Collection (cultures, biopsies, blood, body fluids, etc.) []  - 0 Specimen(s) / Culture(s) sent or taken to Lab for analysis []  - 0 Patient Transfer (multiple staff / Civil Service fast streamer / Similar devices) []  - 0 Simple Staple / Suture removal (25 or less) []  - 0 Complex Staple / Suture removal (26 or more) []  - 0 Hypo / Hyperglycemic Management (close monitor of Blood Glucose) []  - 0 Ankle / Brachial Index (ABI) - do not check if billed separately X- 1 5 Vital Signs Has the patient been seen at the hospital within the last three years: Yes Total Score: 135 Level Of Care: New/Established - Level 4 Electronic Signature(s) Signed: 10/21/2020 6:12:45 PM By: Rhae Hammock RN Entered By: Rhae Hammock on 10/20/2020 13:37:45 -------------------------------------------------------------------------------- Encounter Discharge Information Details Patient Name: Date of Service: Allison Porta E. 10/20/2020 12:30 PM Medical Record Number: [id number] Patient Account Number: [id number] Date of Birth/Sex: Treating RN: October 18, 1950 (70 y.o. Sue Lush Primary Care Kamari Bilek: Shawnie Dapper Other Clinician: Referring Ell Tiso: Treating Kenedy Haisley/Extender: Ruffin Pyo in Treatment: 3 Encounter Discharge Information Items Discharge Condition: Stable Ambulatory Status: Wheelchair Discharge Destination: Home Transportation: Private Auto Accompanied ByRaechel Chute Schedule Follow-up Appointment: Yes Clinical Summary of Care: Provided on 10/20/2020 Form Type Recipient Paper Patient Patient Electronic Signature(s) Signed: 10/20/2020 1:31:14 PM By: Lorrin Jackson Entered By: Lorrin Jackson on 10/20/2020 13:31:13 -------------------------------------------------------------------------------- Lower Extremity Assessment Details Patient Name: Date of Service: [name], [name] 10/20/2020 12:30 PM Medical Record Number: [id number] Patient Account Number: [id number] Date of Birth/Sex: Treating RN: 04/02/1951 (70 y.o. Sue Lush Primary Care Romina Divirgilio: Shawnie Dapper Other Clinician: Referring Subhan Hoopes: Treating Lizmary Nader/Extender: Ruffin Pyo in Treatment: 3 Electronic Signature(s) Signed: 10/20/2020 5:30:58 PM By: Lorrin Jackson Entered By: Lorrin Jackson on 10/20/2020 12:52:07 -------------------------------------------------------------------------------- Multi Wound Chart Details Patient Name: Date of Service: [name], [name] 10/20/2020 12:30 PM Medical Record Number: [id number] Patient Account Number: [id number] Date of Birth/Sex: Treating RN: 1951-03-26 (70 y.o. Tonita Phoenix, Lauren Primary Care Tykee Heideman: Shawnie Dapper Other Clinician: Referring Dontea Corlew: Treating Cici Rodriges/Extender: Ruffin Pyo in Treatment: 3 Vital Signs Height(in): 61 Capillary Blood Glucose(mg/dl): 104 Weight(lbs): 260 Pulse(bpm): 92 Body Mass Index(BMI): 49 Blood Pressure(mmHg): 143/74 Temperature(F): 98.3 Respiratory Rate(breaths/min): 18 Photos: [1:No Photos Right, Medial Upper Leg] [2:No Photos Left Labia] [N/A:N/A N/A] Wound Location: [1:Pressure Injury] [2:Pressure Injury]  [N/A:N/A] Wounding Event: [1:Pressure Ulcer] [2:Pressure Ulcer] [N/A:N/A] Primary Etiology: [1:Cataracts, Anemia, Asthma, Sleep] [2:Cataracts, Anemia, Asthma, Sleep] [N/A:N/A] Comorbid History: [1:Apnea, Hypertension, Type II Diabetes, Osteoarthritis, Neuropathy, Diabetes, Osteoarthritis, Neuropathy, Received Chemotherapy, Received Received Chemotherapy, Received Radiation, Confinement Anxiety 07/10/2020] [2:Apnea, Hypertension,  Type II Radiation, Confinement Anxiety 03/20/2020] [N/A:N/A] Date Acquired: [1:3] [2:3] [N/A:N/A] Weeks of Treatment: [1:Open] [2:Open] [N/A:N/A] Wound Status: [1:0.7x0.3x0.1] [2:1.5x0.3x0.1] [N/A:N/A] Measurements L x W x D (cm) [1:0.165] [2:0.353] [N/A:N/A] A (cm) : rea [1:0.016] [2:0.035] [N/A:N/A] Volume (cm) : [1:93.20%] [2:83.90%] [N/A:N/A] % Reduction in A rea: [1:93.40%] [  2:92.00%] [N/A:N/A] % Reduction in Volume: [1:Category/Stage III] [2:Category/Stage III] [N/A:N/A] Classification: [1:Medium] [2:Medium] [N/A:N/A] Exudate A mount: [1:Serosanguineous] [2:Serosanguineous] [N/A:N/A] Exudate Type: [1:red, brown] [2:red, brown] [N/A:N/A] Exudate Color: [1:Flat and Intact] [2:Flat and Intact] [N/A:N/A] Wound Margin: [1:Large (67-100%)] [2:Large (67-100%)] [N/A:N/A] Granulation A mount: [1:Red, Pink] [2:Red] [N/A:N/A] Granulation Quality: [1:None Present (0%)] [2:None Present (0%)] [N/A:N/A] Necrotic A mount: [1:Fat Layer (Subcutaneous Tissue): Yes Fat Layer (Subcutaneous Tissue): Yes N/A] Exposed Structures: [1:Fascia: No Tendon: No Muscle: No Joint: No Bone: No Small (1-33%)] [2:Fascia: No Tendon: No Muscle: No Joint: No Bone: No Large (67-100%)] [N/A:N/A] Treatment Notes Wound #1 (Upper Leg) Wound Laterality: Right, Medial Cleanser Peri-Wound Care Skin Prep Discharge Instruction: Use skin prep as directed Topical Primary Dressing KerraCel Ag Gelling Fiber Dressing, 2x2 in (silver alginate) Discharge Instruction: Apply silver alginate to wound bed as  instructed Secondary Dressing ComfortFoam Border, 3x3 in (silicone border) Discharge Instruction: Apply over primary dressing as directed. Secured With Compression Wrap Compression Stockings Add-Ons Wound #2 (Labia) Wound Laterality: Left Cleanser Peri-Wound Care Skin Prep Discharge Instruction: Use skin prep as directed Topical Primary Dressing KerraCel Ag Gelling Fiber Dressing, 2x2 in (silver alginate) Discharge Instruction: Apply silver alginate to wound bed as instructed Secondary Dressing ComfortFoam Border, 3x3 in (silicone border) Discharge Instruction: Apply over primary dressing as directed. Secured With Compression Wrap Compression Stockings Environmental education officer) Signed: 10/20/2020 5:25:56 PM By: Linton Ham MD Signed: 10/21/2020 6:12:45 PM By: Rhae Hammock RN Entered By: Linton Ham on 10/20/2020 13:32:57 -------------------------------------------------------------------------------- Multi-Disciplinary Care Plan Details Patient Name: Date of Service: [name], [name] 10/20/2020 12:30 PM Medical Record Number: [id number] Patient Account Number: [id number] Date of Birth/Sex: Treating RN: Mar 06, 1951 (70 y.o. Tonita Phoenix, Lauren Primary Care Reis Goga: Shawnie Dapper Other Clinician: Referring Evalena Fujii: Treating Cassian Torelli/Extender: Ruffin Pyo in Treatment: 3 Multidisciplinary Care Plan reviewed with physician Active Inactive Abuse / Safety / Falls / Self Care Management Nursing Diagnoses: Potential for falls Goals: Patient/caregiver will verbalize/demonstrate measures taken to prevent injury and/or falls Date Initiated: 09/25/2020 Target Resolution Date: 10/23/2020 Goal Status: Active Interventions: Assess fall risk on admission and as needed Assess impairment of mobility on admission and as needed per policy Notes: Wound/Skin Impairment Nursing Diagnoses: Impaired tissue  integrity Goals: Patient/caregiver will verbalize understanding of skin care regimen Date Initiated: 09/25/2020 Target Resolution Date: 10/23/2020 Goal Status: Active Ulcer/skin breakdown will have a volume reduction of 30% by week 4 Date Initiated: 09/25/2020 Target Resolution Date: 10/23/2020 Goal Status: Active Interventions: Assess patient/caregiver ability to obtain necessary supplies Assess patient/caregiver ability to perform ulcer/skin care regimen upon admission and as needed Assess ulceration(s) every visit Provide education on ulcer and skin care Treatment Activities: Skin care regimen initiated : 09/25/2020 Topical wound management initiated : 09/25/2020 Notes: Electronic Signature(s) Signed: 10/21/2020 6:12:45 PM By: Rhae Hammock RN Entered By: Rhae Hammock on 10/20/2020 13:30:38 -------------------------------------------------------------------------------- Pain Assessment Details Patient Name: Date of Service: Allison Porta E. 10/20/2020 12:30 PM Medical Record Number: [id number] Patient Account Number: [id number] Date of Birth/Sex: Treating RN: [birth year] (70 y.o. Sue Lush Primary Care Lesli Issa: Shawnie Dapper Other Clinician: Referring Jeronimo Hellberg: Treating Muhsin Doris/Extender: Ruffin Pyo in Treatment: 3 Active Problems Location of Pain Severity and Description of Pain Patient Has Paino No Site Locations Pain Management and Medication Current Pain Management: Electronic Signature(s) Signed: 10/20/2020 5:30:58 PM By: Lorrin Jackson Entered By: Lorrin Jackson on 10/20/2020 12:51:59 -------------------------------------------------------------------------------- Patient/Caregiver Education Details Patient Name: Date of Service: [name], [name] 7/12/2022andnbsp12:30 PM Medical Record Number:  [id number] Patient Account Number: [id number] Date of Birth/Gender: Treating RN: Apr 24, 1950 (70 y.o. Tonita Phoenix,  Lauren Primary Care Physician: Shawnie Dapper Other Clinician: Referring Physician: Treating Physician/Extender: Ruffin Pyo in Treatment: 3 Education Assessment Education Provided To: Patient Education Topics Provided Wound/Skin Impairment: Methods: Explain/Verbal Responses: State content correctly Electronic Signature(s) Signed: 10/21/2020 6:12:45 PM By: Rhae Hammock RN Entered By: Rhae Hammock on 10/20/2020 13:29:01 -------------------------------------------------------------------------------- Wound Assessment Details Patient Name: Date of Service: [name], [name] 10/20/2020 12:30 PM Medical Record Number: [id number] Patient Account Number: [id number] Date of Birth/Sex: Treating RN: [birth year] (70 y.o. Sue Lush Primary Care Mattie Nordell: Shawnie Dapper Other Clinician: Referring Luma Clopper: Treating Alexys Lobello/Extender: Ruffin Pyo in Treatment: 3 Wound Status Wound Number: 1 Primary Pressure Ulcer Etiology: Wound Location: Right, Medial Upper Leg Wound Open Wounding Event: Pressure Injury Status: Date Acquired: 07/10/2020 Comorbid Cataracts, Anemia, Asthma, Sleep Apnea, Hypertension, Type II Weeks Of Treatment: 3 History: Diabetes, Osteoarthritis, Neuropathy, Received Chemotherapy, Clustered Wound: No Received Radiation, Confinement Anxiety Photos Photo Uploaded By: Sandre Kitty on 10/20/2020 16:20:08 Wound Measurements Length: (cm) 0.7 Width: (cm) 0.3 Depth: (cm) 0.1 Area: (cm) 0.165 Volume: (cm) 0.016 % Reduction in Area: 93.2% % Reduction in Volume: 93.4% Epithelialization: Small (1-33%) Tunneling: No Undermining: No Wound Description Classification: Category/Stage III Wound Margin: Flat and Intact Exudate Amount: Medium Exudate Type: Serosanguineous Exudate Color: red, brown Foul Odor After Cleansing: No Slough/Fibrino No Wound Bed Granulation Amount: Large (67-100%) Exposed  Structure Granulation Quality: Red, Pink Fascia Exposed: No Necrotic Amount: None Present (0%) Fat Layer (Subcutaneous Tissue) Exposed: Yes Tendon Exposed: No Muscle Exposed: No Joint Exposed: No Bone Exposed: No Treatment Notes Wound #1 (Upper Leg) Wound Laterality: Right, Medial Cleanser Peri-Wound Care Skin Prep Discharge Instruction: Use skin prep as directed Topical Primary Dressing KerraCel Ag Gelling Fiber Dressing, 2x2 in (silver alginate) Discharge Instruction: Apply silver alginate to wound bed as instructed Secondary Dressing ComfortFoam Border, 3x3 in (silicone border) Discharge Instruction: Apply over primary dressing as directed. Secured With Compression Wrap Compression Stockings Environmental education officer) Signed: 10/20/2020 5:30:58 PM By: Lorrin Jackson Entered By: Lorrin Jackson on 10/20/2020 12:58:51 -------------------------------------------------------------------------------- Wound Assessment Details Patient Name: Date of Service: [name], [name] 10/20/2020 12:30 PM Medical Record Number: [id number] Patient Account Number: [id number] Date of Birth/Sex: Treating RN: Dec 24, 1950 (70 y.o. Sue Lush Primary Care Arlester Keehan: Shawnie Dapper Other Clinician: Referring Kashlynn Kundert: Treating Venera Privott/Extender: Ruffin Pyo in Treatment: 3 Wound Status Wound Number: 2 Primary Pressure Ulcer Etiology: Wound Location: Left Labia Wound Open Wounding Event: Pressure Injury Status: Date Acquired: 03/20/2020 Comorbid Cataracts, Anemia, Asthma, Sleep Apnea, Hypertension, Type II Weeks Of Treatment: 3 History: Diabetes, Osteoarthritis, Neuropathy, Received Chemotherapy, Clustered Wound: No Received Radiation, Confinement Anxiety Photos Photo Uploaded By: Sandre Kitty on 10/20/2020 16:20:09 Wound Measurements Length: (cm) 1.5 Width: (cm) 0.3 Depth: (cm) 0.1 Area: (cm) 0.353 Volume: (cm) 0.035 % Reduction in Area:  83.9% % Reduction in Volume: 92% Epithelialization: Large (67-100%) Tunneling: No Undermining: No Wound Description Classification: Category/Stage III Wound Margin: Flat and Intact Exudate Amount: Medium Exudate Type: Serosanguineous Exudate Color: red, brown Foul Odor After Cleansing: No Slough/Fibrino No Wound Bed Granulation Amount: Large (67-100%) Exposed Structure Granulation Quality: Red Fascia Exposed: No Necrotic Amount: None Present (0%) Fat Layer (Subcutaneous Tissue) Exposed: Yes Tendon Exposed: No Muscle Exposed: No Joint Exposed: No Bone Exposed: No Treatment Notes Wound #2 (Labia) Wound Laterality: Left Cleanser Peri-Wound Care Skin Prep Discharge Instruction: Use skin prep as directed Topical Primary  Dressing KerraCel Ag Gelling Fiber Dressing, 2x2 in (silver alginate) Discharge Instruction: Apply silver alginate to wound bed as instructed Secondary Dressing ComfortFoam Border, 3x3 in (silicone border) Discharge Instruction: Apply over primary dressing as directed. Secured With Compression Wrap Compression Stockings Environmental education officer) Signed: 10/20/2020 5:30:58 PM By: Lorrin Jackson Entered By: Lorrin Jackson on 10/20/2020 12:59:18 -------------------------------------------------------------------------------- Vitals Details Patient Name: Date of Service: Allison Porta E. 10/20/2020 12:30 PM Medical Record Number: [id number] Patient Account Number: [id number] Date of Birth/Sex: Treating RN: 04/29/1950 (70 y.o. Sue Lush Primary Care Yuvia Plant: Shawnie Dapper Other Clinician: Referring Saranda Legrande: Treating Antanisha Mohs/Extender: Ruffin Pyo in Treatment: 3 Vital Signs Time Taken: 12:51 Temperature (F): 98.3 Height (in): 61 Pulse (bpm): 92 Weight (lbs): 260 Respiratory Rate (breaths/min): 18 Body Mass Index (BMI): 49.1 Blood Pressure (mmHg): 143/74 Capillary Blood Glucose (mg/dl): 104 Reference  Range: 80 - 120 mg / dl Electronic Signature(s) Signed: 10/20/2020 5:30:58 PM By: Lorrin Jackson Entered By: Lorrin Jackson on 10/20/2020 12:51:52

## 2020-10-28 ENCOUNTER — Ambulatory Visit (INDEPENDENT_AMBULATORY_CARE_PROVIDER_SITE_OTHER): Payer: Medicare Other | Admitting: Family Medicine

## 2020-10-28 ENCOUNTER — Encounter: Payer: Self-pay | Admitting: Family Medicine

## 2020-10-28 ENCOUNTER — Other Ambulatory Visit: Payer: Self-pay

## 2020-10-28 VITALS — BP 134/67 | HR 95 | Temp 98.0°F | Ht 61.0 in | Wt 262.4 lb

## 2020-10-28 DIAGNOSIS — I10 Essential (primary) hypertension: Secondary | ICD-10-CM

## 2020-10-28 DIAGNOSIS — G620 Drug-induced polyneuropathy: Secondary | ICD-10-CM | POA: Diagnosis not present

## 2020-10-28 DIAGNOSIS — Z86010 Personal history of colonic polyps: Secondary | ICD-10-CM | POA: Diagnosis not present

## 2020-10-28 DIAGNOSIS — M159 Polyosteoarthritis, unspecified: Secondary | ICD-10-CM

## 2020-10-28 DIAGNOSIS — G894 Chronic pain syndrome: Secondary | ICD-10-CM | POA: Diagnosis not present

## 2020-10-28 DIAGNOSIS — M8949 Other hypertrophic osteoarthropathy, multiple sites: Secondary | ICD-10-CM | POA: Diagnosis not present

## 2020-10-28 DIAGNOSIS — E7800 Pure hypercholesterolemia, unspecified: Secondary | ICD-10-CM | POA: Diagnosis not present

## 2020-10-28 DIAGNOSIS — T451X5A Adverse effect of antineoplastic and immunosuppressive drugs, initial encounter: Secondary | ICD-10-CM | POA: Diagnosis not present

## 2020-10-28 DIAGNOSIS — E538 Deficiency of other specified B group vitamins: Secondary | ICD-10-CM | POA: Diagnosis not present

## 2020-10-28 DIAGNOSIS — D649 Anemia, unspecified: Secondary | ICD-10-CM

## 2020-10-28 LAB — COMPREHENSIVE METABOLIC PANEL
ALT: 16 U/L (ref 0–35)
AST: 15 U/L (ref 0–37)
Albumin: 3.9 g/dL (ref 3.5–5.2)
Alkaline Phosphatase: 70 U/L (ref 39–117)
BUN: 33 mg/dL — ABNORMAL HIGH (ref 6–23)
CO2: 26 mEq/L (ref 19–32)
Calcium: 9.4 mg/dL (ref 8.4–10.5)
Chloride: 103 mEq/L (ref 96–112)
Creatinine, Ser: 0.86 mg/dL (ref 0.40–1.20)
GFR: 68.73 mL/min (ref >60.00)
Glucose, Bld: 97 mg/dL (ref 70–99)
Potassium: 3.9 mEq/L (ref 3.5–5.1)
Sodium: 142 mEq/L (ref 135–145)
Total Bilirubin: 0.2 mg/dL (ref 0.2–1.2)
Total Protein: 7.4 g/dL (ref 6.0–8.3)

## 2020-10-28 LAB — CBC WITH DIFFERENTIAL/PLATELET
Basophils Absolute: 0 10*3/uL (ref 0.0–0.1)
Basophils Relative: 0.3 % (ref 0.0–3.0)
Eosinophils Absolute: 0.3 10*3/uL (ref 0.0–0.7)
Eosinophils Relative: 4.5 % (ref 0.0–5.0)
HCT: 33.6 % — ABNORMAL LOW (ref 36.0–46.0)
Hemoglobin: 11.3 g/dL — ABNORMAL LOW (ref 12.0–15.0)
Lymphocytes Relative: 17 % (ref 12.0–46.0)
Lymphs Abs: 1.3 10*3/uL (ref 0.7–4.0)
MCHC: 33.8 g/dL (ref 30.0–36.0)
MCV: 92.1 fl (ref 78.0–100.0)
Monocytes Absolute: 0.7 10*3/uL (ref 0.1–1.0)
Monocytes Relative: 8.5 % (ref 3.0–12.0)
Neutro Abs: 5.3 10*3/uL (ref 1.4–7.7)
Neutrophils Relative %: 69.7 % (ref 43.0–77.0)
Platelets: 242 10*3/uL (ref 150.0–400.0)
RBC: 3.65 Mil/uL — ABNORMAL LOW (ref 3.87–5.11)
RDW: 15.8 % — ABNORMAL HIGH (ref 11.5–15.5)
WBC: 7.6 10*3/uL (ref 4.0–10.5)

## 2020-10-28 LAB — VITAMIN B12: Vitamin B-12: 454 pg/mL (ref 211–911)

## 2020-10-28 MED ORDER — SYMBICORT 160-4.5 MCG/ACT IN AERO: INHALATION_SPRAY | RESPIRATORY_TRACT | 3 refills | Status: DC

## 2020-10-28 MED ORDER — HYDROCODONE-ACETAMINOPHEN 5-325 MG PO TABS: ORAL_TABLET | ORAL | 0 refills | Status: DC

## 2020-10-28 NOTE — Progress Notes (Signed)
OFFICE VISIT  10/28/2020  CC:  Chief Complaint  Patient presents with   Follow-up    F/u chronic pain syndrome.  Needs a handicap placard filled out.      HPI:    Patient is a 70 y.o. Caucasian female who presents for 4 mo f/u chronic pain syndrome, HTN, HLD, hypothyroidism, and asthma. A/P as of last visit: "1) Chronic pain syndrome: Stable on vicodin 5/325, 1-2 bid prn, #90--new rx today. CSC UTD. Will increase amitriptyline to 50mg  qhs.   2) HLD: tolerating pravastatin 40mg  qd. Plan rpt lipid panel and hepatic panel at next f/u in 3 mo (not fasting today).   3) HTN: well controlled. Cont plendil 10mg  qd.      4) Hypothyroidism: annual TSHs repeatedly wnl dating back to 2016, most recent 12/18/19. Plan rpt 28mo.   5) Severe persistent asthma: due to financial constraints she could not get her symbicort a few weeks ago, does not inc chest tightness since being off this med.  Is now able to pay for this so she needs new rx--given today.   6) DM 2, much improved control, working with Dr. Cruzita Lederer in Endo and this is going well   No labs needed today"  INTERIM HX: Doing fine/stable.  Note fasting today.  Says last a1c was 6.3%---Dr. Cruzita Lederer.  Home bp's consistently 130/80 avg.  Asthma: breathing is good, just a little wheeze if she forgets her symbicort.  Albuterol prn relieves this well.  Ran out of vicodin about a week ago.  Managing with tylenol but gets no relief. Advil 400 bid helps a little.  Vicodin use results in about 50% reduction of pain, usually just takes it at night--back, sciatica, feet all hurt worse hs. Indication for chronic opioid: osteoarthritis in hands, low back, feet, L hip, knees, +chemotherapy -induced neuropathy (burning and numbness in feet). Medication and dose: Vicodin 5/325, 1-2 bid prn # pills per month: 90. PMP AWARE reviewed today: most recent rx for vicodone 5/325 was filled 08/20/20, # 91, rx by me. No red flags.   Past Medical  History:  Diagnosis Date   Allergic rhinitis    Allergy testing: results pending as of 05/28/16 (Dr. Harold Hedge)   Anemia    Arthritis    Chemotherapy-induced neuropathy ([place]) 2019   Cough variant asthma    DDD (degenerative disc disease), lumbar    Diabetes mellitus with complication ([place])    Mild nonprolif DR R eye 12/2017-->referred to retinal specialist DM MANAGED BY DR. GHERGHE AS OF 2021   Endometrial cancer ([place]) 02/2018   REMISSION AS OF 08/2018.  Stage III (T3, Nx, Mx)  Path: endometroid adenocarcinoma involving cervix, uterus, and fallopian tubes (Pt got TAH & BSO 02/2018). No sign of recurrence as of onc f/u 05/2019.   GERD (gastroesophageal reflux disease)    Hemorrhoids    Herpes zoster 10/2015   L side belt-line   History of adenomatous polyp of colon 02/25/13   5 mm cecal polyp removed by Dr. Trevor Mace 5 yrs   History of blood transfusion    History of cellulitis 08/2010   Left (Since replacemnt of Left knee   Hyperkalemia 03/2018   started after pt started getting chemo-->had to stop enalapril.   Hyperlipidemia    Not on statin b/c lipids "stayed down when sugars came down" per pt.  She says Dr. Chalmers Cater knows she is not on statin anymore.   Hypertension    Hypothyroidism    Nephrolithiasis 4/07  Obesity    OSA on CPAP    Osteoarthritis    knees, ankle, + ? scapholunate ligament disruption (x-ray 06/2017)--ortho referral.   Pancytopenia due to antineoplastic chemotherapy ([place])    progressive as of 06/2018. Stable 08/2018.   Recurrent UTI    started cipro 250 qd 09/2018   Sciatica of left side    Severe persistent asthma    cough-variant--saw Allergist 05/25/16 and was switched from max dose advair to symbicort.   Systolic murmur    ECHO fine 10/2016-->murmur likely flow murmur assoc with HTN.   Zoon's vulvitis    Bx-proven (GYN) -lichen sclerosis.  Clobetasol 0.05% ointment per GYN    Past Surgical History:  Procedure Laterality Date   ANKLE FRACTURE SURGERY   1984   Pin & repair   ARTHROSCOPIC REPAIR ACL  2000   Due to ACL tear   ARTHROSCOPIC REPAIR ACL  5/08   Blateral knee rerlacements x4 2 times each knee     BREAST BIOPSY Right 2014   Benign   CARDIAC CATHETERIZATION  5/07   clear vessel mild mitral    CARPAL TUNNEL RELEASE Right [id number];[id number]   1989 Left   CESAREAN SECTION  1985   CHOLECYSTECTOMY OPEN  1988   COLONOSCOPY N/A 02/25/2013   Tubular adenoma x 1: Recall 5 yrs. Procedure: COLONOSCOPY;  Surgeon: Juanita Craver, MD;  Location: [facility];  Service: Endoscopy;  Laterality: N/A;   COMBINED HYSTEROSCOPY DIAGNOSTIC / D&C  2/12   Bx neg   DEXA  08/2007; 05/12/20   Bone density normal 2009 and 2022.  Plan rpt 2024.   DILATION AND CURETTAGE OF UTERUS  12/11   IR IMAGING GUIDED PORT INSERTION  03/16/2018   IR REMOVAL TUN ACCESS W/ PORT W/O FL MOD SED  09/20/2018   LYMPH NODE BIOPSY N/A 02/19/2018   Procedure: Sentinel LYMPH NODE BIOPSY;  Surgeon: Everitt Amber, MD;  Location: [facility];  Service: Gynecology;  Laterality: N/A;   REPLACEMENT TOTAL KNEE Left 5/12   X 2 on each   ROBOTIC ASSISTED TOTAL HYSTERECTOMY WITH BILATERAL SALPINGO OOPHERECTOMY N/A 02/19/2018   For endometrial cancer.  Procedure: XI ROBOTIC ASSISTED TOTAL HYSTERECTOMY WITH BILATERAL SALPINGO OOPHORECTOMY;  Surgeon: Everitt Amber, MD;  Location: [facility];  Service: Gynecology;  Laterality: N/A;   TRANSTHORACIC ECHOCARDIOGRAM  10/11/2016    EF 55-60%, grd I DD.   TUBAL LIGATION  1985   C-Section    Outpatient Medications Prior to Visit  Medication Sig Dispense Refill   albuterol (PROVENTIL) (2.5 MG/3ML) 0.083% nebulizer solution Take 3 mLs (2.5 mg total) by nebulization every 4 (four) hours as needed for wheezing or shortness of breath (Dx: J45.50). 75 mL 1   albuterol (VENTOLIN HFA) 108 (90 Base) MCG/ACT inhaler INHALE 2 PUFFS INTO LUNGS EVERY 6 HOURS AS NEEDED FOR WHEEZING/SHORTNESS OF BREATH 18 each 1   amitriptyline (ELAVIL) 25 MG  tablet 2 tabs po qhs 180 tablet 3   clobetasol ointment (TEMOVATE) 6.73 % Apply 1 application topically at bedtime. Apply to the skin of the vulva for 12 weeks 30 g 2   Continuous Blood Gluc Receiver (FREESTYLE LIBRE 14 DAY READER) DEVI by Does not apply route.     diclofenac Sodium (VOLTAREN) 1 % GEL Apply 2 g topically 4 (four) times daily. 100 g 3   diphenoxylate-atropine (LOMOTIL) 2.5-0.025 MG tablet 1-2 tabs po qid prn diarrhea 30 tablet 2   felodipine (PLENDIL) 10 MG 24 hr tablet TAKE 1 TABLET  BY MOUTH EVERY DAY 90 tablet 1   fluticasone (CUTIVATE) 0.05 % cream APPLY TO AFFECTED AREA OF R LOWER LEG TWICE PER DAY. 30 g 0   fluticasone (VERAMYST) 27.5 MCG/SPRAY nasal spray Place 1 spray into the nose daily as needed for rhinitis.     furosemide (LASIX) 20 MG tablet TAKE 1 TABLET BY MOUTH EVERY DAY 90 tablet 1   insulin aspart (NOVOLOG) 100 UNIT/ML injection USE 20-25 units under skin 3x a day before meals 90 mL 3   insulin NPH Human (NOVOLIN N) 100 UNIT/ML injection INJECT 30 UNITS UNDER THE SKIN 3 TIMES A DAY 30 mL 6   Insulin Syringes, Disposable, U-100 0.3 ML MISC Use to inject insulin--6 injections per day 540 each 3   levocetirizine (XYZAL) 5 MG tablet Take 5 mg by mouth at bedtime as needed for allergies.   5   levothyroxine (SYNTHROID) 125 MCG tablet TAKE 1 TABLET BY MOUTH EVERY DAY 90 tablet 0   metFORMIN (GLUCOPHAGE) 500 MG tablet TAKE 1 TABLET BY MOUTH EVERY MORNING AND 2 TABLETS BY MOUTH AT BEDTIME. 270 tablet 3   miconazole (MICOTIN) 2 % powder Apply 1 application topically 2 (two) times daily as needed for itching.     pravastatin (PRAVACHOL) 40 MG tablet TAKE 1 TABLET BY MOUTH EVERY DAY 90 tablet 0   Probiotic Product (PROBIOTIC PO) Take 1 capsule by mouth daily.     HYDROcodone-acetaminophen (NORCO/VICODIN) 5-325 MG tablet 1-2 tabs po bid prn pain 90 tablet 0   SYMBICORT 160-4.5 MCG/ACT inhaler INHALE 2 PUFFS BY MOUTH TWICE A DAY 30.6 each 3   doxycycline (VIBRA-TABS) 100 MG  tablet Take 1 tablet (100 mg total) by mouth 2 (two) times daily. (Patient not taking: Reported on 10/28/2020) 14 tablet 0   No facility-administered medications prior to visit.    Allergies  Allergen Reactions   Adhesive [Tape] Other (See Comments)    Takes patient's skin off.   Banana Diarrhea and Nausea Only   Eggs Or Egg-Derived Products Diarrhea and Nausea Only   Latex Other (See Comments)    sensativity   Linzess [Linaclotide] Diarrhea   Ozempic (0.25 Or 0.5 Mg-Dose) [Semaglutide(0.25 Or 0.5mg -Dos)] Diarrhea   Penicillins Rash and Other (See Comments)    Has had cephalosporins without incident   Pine Swelling and Rash   Rose Swelling and Rash    ROS As per HPI  PE: Vitals with BMI 10/28/2020 09/01/2020 07/01/2020  Height 5\' 1"  5\' 1"  5\' 1"   Weight 262 lbs 6 oz 260 lbs 3 oz 265 lbs 6 oz  BMI 49.61 50.93 26.71  Systolic 245 809 983  Diastolic 67 82 69  Pulse 95 103 79    Gen: Alert, well appearing.  Patient is oriented to person, place, time, and situation. AFFECT: pleasant, lucid thought and speech. CV: RRR, no m/r/g.   LUNGS: CTA bilat, nonlabored resps, good aeration in all lung fields. EXT: no clubbing or cyanosis.  Trace R>L pitting edema.    LABS:  Lab Results  Component Value Date   TSH 2.88 12/18/2019   Lab Results  Component Value Date   WBC 10.1 01/21/2019   HGB 10.3 (L) 01/21/2019   HCT 31.5 (L) 01/21/2019   MCV 96.1 01/21/2019   PLT 223.0 01/21/2019  No results found for: IRON, TIBC, FERRITIN No results found for: VITAMINB12  Lab Results  Component Value Date   CREATININE 0.85 06/16/2020   BUN 23 06/16/2020   NA 139 06/16/2020  K 3.7 06/16/2020   CL 104 06/16/2020   CO2 25 06/16/2020   Lab Results  Component Value Date   ALT 17 12/18/2019   AST 18 12/18/2019   ALKPHOS 62 12/18/2019   BILITOT 0.4 12/18/2019   Lab Results  Component Value Date   CHOL 172 12/18/2019   Lab Results  Component Value Date   HDL 39.60 12/18/2019    Lab Results  Component Value Date   LDLCALC 99 12/18/2019   Lab Results  Component Value Date   TRIG 164.0 (H) 12/18/2019   Lab Results  Component Value Date   CHOLHDL 4 12/18/2019   Lab Results  Component Value Date   HGBA1C 6.3 (A) 09/01/2020   IMPRESSION AND PLAN:  1) Chronic pain syndrome:   Stable on vicodin 5/325, 1-2 bid prn, #90--new rx today. Continue amitriptyline 50mg  qhs. CSC UTD.  2) HTN: stable on felodipine 10mg  qd. Lytes/cr today.  3) HLD: tolerating pravastatin 40mg  qd. Not fasting today. FLP rpt when able if pt fasting at a future appt.   Hepatic panel today.  4) Moderate/severe persistent asthma: stable on symbicort 160/4.5, 2 p bid.  Cont albut q4h prn?rescue as well.  5) Hypothyroidism; 125 mcg T4 qd.  TSH has been stable last few years. Plan annual TSH in 3 mo. She is not on iron supp or b12 supp: takes 1 women's MVI.  6) Normocytic anemia, chronic. ? Hx of vit B12 deficienty--hx unclear. Check Cbc, iron panel, vit b12 levels today.  7) DM 2, great control now on metformin and insulin--> working with Dr. Cruzita Lederer in Endo and this is going well  An After Visit Summary was printed and given to the patient.  FOLLOW UP: Return in about 3 months (around 01/28/2021) for routine chronic illness f/u.  Signed:  Crissie Sickles, MD           10/28/2020

## 2020-10-29 ENCOUNTER — Encounter: Payer: Self-pay | Admitting: Family Medicine

## 2020-10-29 LAB — IRON,TIBC AND FERRITIN PANEL
%SAT: 15 % (calc) — ABNORMAL LOW (ref 16–45)
Ferritin: 26 ng/mL (ref 16–288)
Iron: 58 ug/dL (ref 45–160)
TIBC: 391 mcg/dL (calc) (ref 250–450)

## 2020-11-03 ENCOUNTER — Encounter (HOSPITAL_BASED_OUTPATIENT_CLINIC_OR_DEPARTMENT_OTHER): Payer: Medicare Other | Admitting: Internal Medicine

## 2020-11-03 ENCOUNTER — Other Ambulatory Visit: Payer: Self-pay

## 2020-11-03 DIAGNOSIS — L97118 Non-pressure chronic ulcer of right thigh with other specified severity: Secondary | ICD-10-CM | POA: Diagnosis not present

## 2020-11-03 DIAGNOSIS — L98492 Non-pressure chronic ulcer of skin of other sites with fat layer exposed: Secondary | ICD-10-CM | POA: Diagnosis not present

## 2020-11-03 DIAGNOSIS — Z8542 Personal history of malignant neoplasm of other parts of uterus: Secondary | ICD-10-CM | POA: Diagnosis not present

## 2020-11-03 DIAGNOSIS — L98498 Non-pressure chronic ulcer of skin of other sites with other specified severity: Secondary | ICD-10-CM | POA: Diagnosis not present

## 2020-11-03 NOTE — Progress Notes (Signed)
JUSTA, ARCIERO ([id number]) Visit Report for 11/03/2020 HPI Details Patient Name: Date of Service: [name], [name] 11/03/2020 12:30 PM Medical Record Number: [id number] Patient Account Number: [id number] Date of Birth/Sex: Treating RN: 07/31/1950 (70 y.o. Tonita Phoenix, Lauren Primary Care Provider: Shawnie Dapper Other Clinician: Referring Provider: Treating Provider/Extender: Ruffin Pyo in Treatment: 5 History of Present Illness HPI Description: ADMISSION 09/25/2020 This is a 70 year old woman who was referred from her endocrinologist for "pressure ulcers" 1 of which is in the left posterior labia and the other in close proximity in the right upper thigh close to the gluteal fold. She has had the area on the labia since December and more recently picked up the area in the thigh. Both of these areas are in close proximity although they are not obviously mirror-image areas. She is able to tell me fairly definitively that she did not have skin lesions on this area that she had before the ulcers. She is still ambulatory. She has a history of endometrial cancer but received seed implants for radiation rather than external beam radiation. She has a history of a right buttock ulcer and a left hip ulcer in 2021 which were stage II but these closed over. Past medical history includes type 2 diabetes, hyperlipidemia, hypertension, chronic low back pain, endometrial CA treated with seed implants and a hysterectomy, pressure ulcers as noted above. 6/30; patient has 2 areas one is on the left posterior labia and the other in close proximity to the labia wound on the right upper thigh close to the gluteal fold. We have been using silver alginate and foam borders that are changing this at home. We counseled about offloading this area i.e. I cannot see her sitting for any length of time 10/20/2020; 2-week follow-up. The patient has an area on the left posterior labia and an area  on the right upper thigh in close proximity to the gluteal fold. I changed her to silver collagen last time. She is offloading this area. The patient is not really sure how these came to pass. She tells me that she has generalized plaque psoriasis but I do not know that this has anything to do with it even if you wanted to talk about inverse psoriasis. We have been using silver alginate with a foam cover 7/26; both the areas on the left posterior labia and on the right upper thigh in close proximity to the gluteal fold are closed. I was never exactly sure what the etiology of these are at work. She says the area on the left had been there since January and the right more recently. Electronic Signature(s) Signed: 11/03/2020 5:43:33 PM By: Linton Ham MD Entered By: Linton Ham on 11/03/2020 13:08:50 -------------------------------------------------------------------------------- Physical Exam Details Patient Name: Date of Service: SYNETHIA, TAKACH 11/03/2020 12:30 PM Medical Record Number: [id number] Patient Account Number: [id number] Date of Birth/Sex: Treating RN: 10-Jul-1950 (70 y.o. Benjaman Lobe Primary Care Provider: Shawnie Dapper Other Clinician: Referring Provider: Treating Provider/Extender: Ruffin Pyo in Treatment: 5 Constitutional Patient is hypertensive.. Pulse regular and within target range for patient.Marland Kitchen Respirations regular, non-labored and within target range.. Temperature is normal and within the target range for the patient.Marland Kitchen Appears in no distress. Notes Wound exam; both of these areas are closed. There is no obvious dermatologic issue here. Electronic Signature(s) Signed: 11/03/2020 5:43:33 PM By: Linton Ham MD Entered By: Linton Ham on 11/03/2020 13:09:37 -------------------------------------------------------------------------------- Physician Orders Details Patient Name: Date of Service: [place], Dumas  NCY E.  11/03/2020 12:30 PM Medical Record Number: [id number] Patient Account Number: [id number] Date of Birth/Sex: Treating RN: 1950-09-11 (70 y.o. Tonita Phoenix, Lauren Primary Care Provider: Shawnie Dapper Other Clinician: Referring Provider: Treating Provider/Extender: Ruffin Pyo in Treatment: 5 Verbal / Phone Orders: No Diagnosis Coding Follow-up Appointments Other: - No need to follow up!!:) Discharge From [facility] Services Discharge from [place] Signature(s) Signed: 11/03/2020 5:43:33 PM By: Linton Ham MD Signed: 11/03/2020 5:55:16 PM By: Rhae Hammock RN Entered By: Rhae Hammock on 11/03/2020 13:01:14 -------------------------------------------------------------------------------- Problem List Details Patient Name: Date of Service: MUKTA, SVATOS 11/03/2020 12:30 PM Medical Record Number: [id number] Patient Account Number: [id number] Date of Birth/Sex: Treating RN: [birth year] (70 y.o. Tonita Phoenix, Lauren Primary Care Provider: Shawnie Dapper Other Clinician: Referring Provider: Treating Provider/Extender: Ruffin Pyo in Treatment: 5 Active Problems ICD-10 Encounter Code Description Active Date MDM Diagnosis L97.118 Non-pressure chronic ulcer of right thigh with other specified severity 09/25/2020 No Yes L98.498 Non-pressure chronic ulcer of skin of other sites with other specified severity 09/25/2020 No Yes Inactive Problems Resolved Problems Electronic Signature(s) Signed: 11/03/2020 5:43:33 PM By: Linton Ham MD Entered By: Linton Ham on 11/03/2020 13:07:48 -------------------------------------------------------------------------------- Progress Note Details Patient Name: Date of Service: Royann Shivers. 11/03/2020 12:30 PM Medical Record Number: [id number] Patient Account Number: [id number] Date of Birth/Sex: Treating RN: 1950-07-06 (71 y.o. Tonita Phoenix, Lauren Primary Care  Provider: Shawnie Dapper Other Clinician: Referring Provider: Treating Provider/Extender: Ruffin Pyo in Treatment: 5 Subjective History of Present Illness (HPI) ADMISSION 09/25/2020 This is a 70 year old woman who was referred from her endocrinologist for "pressure ulcers" 1 of which is in the left posterior labia and the other in close proximity in the right upper thigh close to the gluteal fold. She has had the area on the labia since December and more recently picked up the area in the thigh. Both of these areas are in close proximity although they are not obviously mirror-image areas. She is able to tell me fairly definitively that she did not have skin lesions on this area that she had before the ulcers. She is still ambulatory. She has a history of endometrial cancer but received seed implants for radiation rather than external beam radiation. She has a history of a right buttock ulcer and a left hip ulcer in 2021 which were stage II but these closed over. Past medical history includes type 2 diabetes, hyperlipidemia, hypertension, chronic low back pain, endometrial CA treated with seed implants and a hysterectomy, pressure ulcers as noted above. 6/30; patient has 2 areas one is on the left posterior labia and the other in close proximity to the labia wound on the right upper thigh close to the gluteal fold. We have been using silver alginate and foam borders that are changing this at home. We counseled about offloading this area i.e. I cannot see her sitting for any length of time 10/20/2020; 2-week follow-up. The patient has an area on the left posterior labia and an area on the right upper thigh in close proximity to the gluteal fold. I changed her to silver collagen last time. She is offloading this area. The patient is not really sure how these came to pass. She tells me that she has generalized plaque psoriasis but I do not know that this has anything to do  with it even if you wanted to talk about inverse psoriasis. We have been using silver alginate with a foam  cover 7/26; both the areas on the left posterior labia and on the right upper thigh in close proximity to the gluteal fold are closed. I was never exactly sure what the etiology of these are at work. She says the area on the left had been there since January and the right more recently. Objective Constitutional Patient is hypertensive.. Pulse regular and within target range for patient.Marland Kitchen Respirations regular, non-labored and within target range.. Temperature is normal and within the target range for the patient.Marland Kitchen Appears in no distress. Vitals Time Taken: 12:44 PM, Height: 61 in, Source: Stated, Weight: 260 lbs, Source: Stated, BMI: 49.1, Temperature: 98.2 F, Pulse: 87 bpm, Respiratory Rate: 18 breaths/min, Blood Pressure: 168/76 mmHg, Capillary Blood Glucose: 149 mg/dl. General Notes: glucose per pt report this am General Notes: Wound exam; both of these areas are closed. There is no obvious dermatologic issue here. Integumentary (Hair, Skin) Wound #1 status is Open. Original cause of wound was Pressure Injury. The date acquired was: 07/10/2020. The wound has been in treatment 5 weeks. The wound is located on the Right,Medial Upper Leg. The wound measures 0cm length x 0cm width x 0cm depth; 0cm^2 area and 0cm^3 volume. There is no tunneling or undermining noted. There is a none present amount of drainage noted. There is no granulation within the wound bed. There is no necrotic tissue within the wound bed. Wound #2 status is Open. Original cause of wound was Pressure Injury. The date acquired was: 03/20/2020. The wound has been in treatment 5 weeks. The wound is located on the Left Labia. The wound measures 0cm length x 0cm width x 0cm depth; 0cm^2 area and 0cm^3 volume. There is no tunneling or undermining noted. There is a none present amount of drainage noted. There is no granulation  within the wound bed. There is no necrotic tissue within the wound bed. Assessment Active Problems ICD-10 Non-pressure chronic ulcer of right thigh with other specified severity Non-pressure chronic ulcer of skin of other sites with other specified severity Plan Follow-up Appointments: Other: - No need to follow up!!:) Discharge From [facility] Services: Discharge from [place] 1. Everything is closed here I see no open lesions. 2. Advised to keep these areas clean and dry. The exact cause of these was never clear Electronic Signature(s) Signed: 11/03/2020 5:43:33 PM By: Linton Ham MD Entered By: Linton Ham on 11/03/2020 13:10:10 -------------------------------------------------------------------------------- SuperBill Details Patient Name: Date of Service: Royann Shivers 11/03/2020 Medical Record Number: [id number] Patient Account Number: [id number] Date of Birth/Sex: Treating RN: 04/17/1950 (70 y.o. Tonita Phoenix, Lauren Primary Care Provider: Shawnie Dapper Other Clinician: Referring Provider: Treating Provider/Extender: Ruffin Pyo in Treatment: 5 Diagnosis Coding ICD-10 Codes Code Description [phone] Non-pressure chronic ulcer of right thigh with other specified severity L98.498 Non-pressure chronic ulcer of skin of other sites with other specified severity Facility Procedures CPT4 Code: [id number] Description: 99213 - WOUND CARE VISIT-LEV 3 EST PT Modifier: Quantity: 1 Physician Procedures : CPT4 Code Description Modifier [id number] - WC PHYS LEVEL 2 - EST PT ICD-10 Diagnosis Description [id number] Non-pressure chronic ulcer of right thigh with other specified severity L98.498 Non-pressure chronic ulcer of skin of other sites with other  specified severity Quantity: 1 Electronic Signature(s) Signed: 11/03/2020 5:43:33 PM By: Linton Ham MD Signed: 11/03/2020 5:55:16 PM By: Rhae Hammock RN Entered By: Rhae Hammock on  11/03/2020 13:14:56

## 2020-11-03 NOTE — Progress Notes (Addendum)
Allison, Peters ([id number]) Visit Report for 11/03/2020 Arrival Information Details Patient Name: Date of Service: Allison, Peters 11/03/2020 12:30 PM Medical Record Number: [id number] Patient Account Number: [id number] Date of Birth/Sex: Treating RN: [birth year] (70 y.o. Elam Dutch Primary Care Jahnya Trindade: Shawnie Dapper Other Clinician: Referring Mozes Sagar: Treating Jerrico Covello/Extender: Ruffin Pyo in Treatment: 5 Visit Information History Since Last Visit Added or deleted any medications: Yes Patient Arrived: Wheel Chair Any new allergies or adverse reactions: No Arrival Time: 12:40 Had a fall or experienced change in No Accompanied By: self activities of daily living that may affect Transfer Assistance: None risk of falls: Patient Identification Verified: Yes Signs or symptoms of abuse/neglect since last visito No Secondary Verification Process Completed: Yes Hospitalized since last visit: No Patient Requires Transmission-Based Precautions: No Implantable device outside of the clinic excluding No Patient Has Alerts: No cellular tissue based products placed in the center since last visit: Has Dressing in Place as Prescribed: Yes Pain Present Now: No Electronic Signature(s) Signed: 11/03/2020 5:08:38 PM By: Baruch Gouty RN, BSN Entered By: Baruch Gouty on 11/03/2020 12:44:23 -------------------------------------------------------------------------------- Clinic Level of Care Assessment Details Patient Name: Date of Service: Allison, Peters 11/03/2020 12:30 PM Medical Record Number: [id number] Patient Account Number: [id number] Date of Birth/Sex: Treating RN: 1950-10-08 (70 y.o. [name], [name] Primary Care Compton Brigance: Shawnie Dapper Other Clinician: Referring Halana Deisher: Treating Emalia Witkop/Extender: Ruffin Pyo in Treatment: 5 Clinic Level of Care Assessment Items TOOL 4 Quantity Score X- 1 0 Use when  only an EandM is performed on FOLLOW-UP visit ASSESSMENTS - Nursing Assessment / Reassessment X- 1 10 Reassessment of Co-morbidities (includes updates in patient status) X- 1 5 Reassessment of Adherence to Treatment Plan ASSESSMENTS - Wound and Skin A ssessment / Reassessment '[]'$  - 0 Simple Wound Assessment / Reassessment - one wound '[]'$  - 0 Complex Wound Assessment / Reassessment - multiple wounds '[]'$  - 0 Dermatologic / Skin Assessment (not related to wound area) ASSESSMENTS - Focused Assessment '[]'$  - 0 Circumferential Edema Measurements - multi extremities '[]'$  - 0 Nutritional Assessment / Counseling / Intervention '[]'$  - 0 Lower Extremity Assessment (monofilament, tuning fork, pulses) '[]'$  - 0 Peripheral Arterial Disease Assessment (using hand held doppler) ASSESSMENTS - Ostomy and/or Continence Assessment and Care '[]'$  - 0 Incontinence Assessment and Management '[]'$  - 0 Ostomy Care Assessment and Management (repouching, etc.) PROCESS - Coordination of Care X - Simple Patient / Family Education for ongoing care 1 15 '[]'$  - 0 Complex (extensive) Patient / Family Education for ongoing care X- 1 10 Staff obtains Programmer, systems, Records, T Results / Process Orders est X- 1 10 Staff telephones HHA, Nursing Homes / Clarify orders / etc '[]'$  - 0 Routine Transfer to another Facility (non-emergent condition) '[]'$  - 0 Routine Hospital Admission (non-emergent condition) '[]'$  - 0 New Admissions / Biomedical engineer / Ordering NPWT Apligraf, etc. , '[]'$  - 0 Emergency Hospital Admission (emergent condition) X- 1 10 Simple Discharge Coordination '[]'$  - 0 Complex (extensive) Discharge Coordination PROCESS - Special Needs '[]'$  - 0 Pediatric / Minor Patient Management '[]'$  - 0 Isolation Patient Management '[]'$  - 0 Hearing / Language / Visual special needs '[]'$  - 0 Assessment of Community assistance (transportation, D/C planning, etc.) '[]'$  - 0 Additional assistance / Altered mentation '[]'$  - 0 Support  Surface(s) Assessment (bed, cushion, seat, etc.) INTERVENTIONS - Wound Cleansing / Measurement X - Simple Wound Cleansing - one wound 1 5 '[]'$  - 0 Complex Wound Cleansing - multiple wounds  X- 1 5 Wound Imaging (photographs - any number of wounds) '[]'$  - 0 Wound Tracing (instead of photographs) X- 1 5 Simple Wound Measurement - one wound '[]'$  - 0 Complex Wound Measurement - multiple wounds INTERVENTIONS - Wound Dressings X - Small Wound Dressing one or multiple wounds 1 10 '[]'$  - 0 Medium Wound Dressing one or multiple wounds '[]'$  - 0 Large Wound Dressing one or multiple wounds '[]'$  - 0 Application of Medications - topical '[]'$  - 0 Application of Medications - injection INTERVENTIONS - Miscellaneous '[]'$  - 0 External ear exam '[]'$  - 0 Specimen Collection (cultures, biopsies, blood, body fluids, etc.) '[]'$  - 0 Specimen(s) / Culture(s) sent or taken to Lab for analysis '[]'$  - 0 Patient Transfer (multiple staff / Civil Service fast streamer / Similar devices) '[]'$  - 0 Simple Staple / Suture removal (25 or less) '[]'$  - 0 Complex Staple / Suture removal (26 or more) '[]'$  - 0 Hypo / Hyperglycemic Management (close monitor of Blood Glucose) '[]'$  - 0 Ankle / Brachial Index (ABI) - do not check if billed separately X- 1 5 Vital Signs Has the patient been seen at the hospital within the last three years: Yes Total Score: 90 Level Of Care: New/Established - Level 3 Electronic Signature(s) Signed: 11/03/2020 5:55:16 PM By: Rhae Hammock RN Entered By: Rhae Hammock on 11/03/2020 13:13:54 -------------------------------------------------------------------------------- Lower Extremity Assessment Details Patient Name: Date of Service: [name] 11/03/2020 12:30 PM Medical Record Number: [id number] Patient Account Number: [id number] Date of Birth/Sex: Treating RN: Oct 09, 1950 (70 y.o. Elam Dutch Primary Care Lavi Sheehan: Shawnie Dapper Other Clinician: Referring Ashanti Ratti: Treating Jaymen Fetch/Extender:  Ruffin Pyo in Treatment: 5 Electronic Signature(s) Signed: 11/03/2020 5:08:38 PM By: Baruch Gouty RN, BSN Entered By: Baruch Gouty on 11/03/2020 12:47:33 -------------------------------------------------------------------------------- Multi Wound Chart Details Patient Name: Date of Service: [name] 11/03/2020 12:30 PM Medical Record Number: [id number] Patient Account Number: [id number] Date of Birth/Sex: Treating RN: [birth year] (70 y.o. [name], [name] Primary Care Raudel Bazen: Shawnie Dapper Other Clinician: Referring Vinayak Bobier: Treating Taylar Hartsough/Extender: Ruffin Pyo in Treatment: 5 Vital Signs Height(in): 61 Capillary Blood Glucose(mg/dl): 149 Weight(lbs): 260 Pulse(bpm): 87 Body Mass Index(BMI): 55 Blood Pressure(mmHg): 168/76 Temperature(F): 98.2 Respiratory Rate(breaths/min): 18 Photos: [N/A:N/A] Right, Medial Upper Leg Left Labia N/A Wound Location: Pressure Injury Pressure Injury N/A Wounding Event: Pressure Ulcer Pressure Ulcer N/A Primary Etiology: Cataracts, Anemia, Asthma, Sleep Cataracts, Anemia, Asthma, Sleep N/A Comorbid History: Apnea, Hypertension, Type II Apnea, Hypertension, Type II Diabetes, Osteoarthritis, Neuropathy, Diabetes, Osteoarthritis, Neuropathy, Received Chemotherapy, Received Received Chemotherapy, Received Radiation, Confinement Anxiety Radiation, Confinement Anxiety 07/10/2020 03/20/2020 N/A Date Acquired: 5 5 N/A Weeks of Treatment: Open Open N/A Wound Status: 0x0x0 0x0x0 N/A Measurements L x W x D (cm) 0 0 N/A A (cm) : rea 0 0 N/A Volume (cm) : 100.00% 100.00% N/A % Reduction in A rea: 100.00% 100.00% N/A % Reduction in Volume: Category/Stage III Category/Stage III N/A Classification: None Present None Present N/A Exudate A mount: None Present (0%) None Present (0%) N/A Granulation A mount: None Present (0%) None Present (0%) N/A Necrotic A  mount: Fascia: No Fascia: No N/A Exposed Structures: Fat Layer (Subcutaneous Tissue): No Fat Layer (Subcutaneous Tissue): No Tendon: No Tendon: No Muscle: No Muscle: No Joint: No Joint: No Bone: No Bone: No Large (67-100%) Large (67-100%) N/A Epithelialization: Treatment Notes Electronic Signature(s) Signed: 11/03/2020 5:43:33 PM By: Linton Ham MD Signed: 11/03/2020 5:55:16 PM By: Rhae Hammock RN Entered By: Linton Ham on 11/03/2020 13:07:57 --------------------------------------------------------------------------------  Multi-Disciplinary Care Plan Details Patient Name: Date of Service: Allison, Peters 11/03/2020 12:30 PM Medical Record Number: [id number] Patient Account Number: [id number] Date of Birth/Sex: Treating RN: [birth year] (70 y.o. [name], [name] Primary Care Charles Niese: Shawnie Dapper Other Clinician: Referring Darin Arndt: Treating Demeisha Geraghty/Extender: Ruffin Pyo in Treatment: 5 Multidisciplinary Care Plan reviewed with physician Active Inactive Electronic Signature(s) Signed: 04/29/2021 12:40:52 PM By: Rhae Hammock RN Previous Signature: 11/03/2020 5:55:16 PM Version By: Rhae Hammock RN Entered By: Rhae Hammock on 12/08/2020 08:00:08 -------------------------------------------------------------------------------- Pain Assessment Details Patient Name: Date of Service: Allison, Peters 11/03/2020 12:30 PM Medical Record Number: [id number] Patient Account Number: [id number] Date of Birth/Sex: Treating RN: 22-Dec-1950 (70 y.o. Elam Dutch Primary Care Khiana Camino: Shawnie Dapper Other Clinician: Referring Stylianos Stradling: Treating Latunya Kissick/Extender: Ruffin Pyo in Treatment: 5 Active Problems Location of Pain Severity and Description of Pain Patient Has Paino No Patient Has Paino No Site Locations Rate the pain. Current Pain Level: 0 Pain Management and Medication Current  Pain Management: Electronic Signature(s) Signed: 11/03/2020 5:08:38 PM By: Baruch Gouty RN, BSN Entered By: Baruch Gouty on 11/03/2020 12:47:26 -------------------------------------------------------------------------------- Patient/Caregiver Education Details Patient Name: Date of Service: [name] 7/26/2022andnbsp12:30 PM Medical Record Number: [id number] Patient Account Number: [id number] Date of Birth/Gender: Treating RN: 1951/04/08 (70 y.o. [name], [name] Primary Care Physician: Shawnie Dapper Other Clinician: Referring Physician: Treating Physician/Extender: Ruffin Pyo in Treatment: 5 Education Assessment Education Provided To: Patient Education Topics Provided Wound/Skin Impairment: Methods: Explain/Verbal Responses: State content correctly Electronic Signature(s) Signed: 11/03/2020 5:55:16 PM By: Rhae Hammock RN Entered By: Rhae Hammock on 11/03/2020 13:11:54 -------------------------------------------------------------------------------- Wound Assessment Details Patient Name: Date of Service: Allison, Peters 11/03/2020 12:30 PM Medical Record Number: [id number] Patient Account Number: [id number] Date of Birth/Sex: Treating RN: September 28, 1950 (70 y.o. Elam Dutch Primary Care Ulysee Fyock: Shawnie Dapper Other Clinician: Referring Yvana Samonte: Treating Nekita Pita/Extender: Ruffin Pyo in Treatment: 5 Wound Status Wound Number: 1 Primary Pressure Ulcer Etiology: Wound Location: Right, Medial Upper Leg Wound Open Wounding Event: Pressure Injury Status: Date Acquired: 07/10/2020 Comorbid Cataracts, Anemia, Asthma, Sleep Apnea, Hypertension, Type II Weeks Of Treatment: 5 History: Diabetes, Osteoarthritis, Neuropathy, Received Chemotherapy, Clustered Wound: No Received Radiation, Confinement Anxiety Photos Wound Measurements Length: (cm) Width: (cm) Depth: (cm) Area: (cm) Volume:  (cm) 0 % Reduction in Area: 100% 0 % Reduction in Volume: 100% 0 Epithelialization: Large (67-100%) 0 Tunneling: No 0 Undermining: No Wound Description Classification: Category/Stage III Exudate Amount: None Present Foul Odor After Cleansing: No Slough/Fibrino No Wound Bed Granulation Amount: None Present (0%) Exposed Structure Necrotic Amount: None Present (0%) Fascia Exposed: No Fat Layer (Subcutaneous Tissue) Exposed: No Tendon Exposed: No Muscle Exposed: No Joint Exposed: No Bone Exposed: No Electronic Signature(s) Signed: 11/03/2020 5:08:38 PM By: Baruch Gouty RN, BSN Entered By: Baruch Gouty on 11/03/2020 12:56:10 -------------------------------------------------------------------------------- Wound Assessment Details Patient Name: Date of Service: [name]. 11/03/2020 12:30 PM Medical Record Number: [id number] Patient Account Number: [id number] Date of Birth/Sex: Treating RN: October 18, 1950 (70 y.o. Elam Dutch Primary Care Masson Nalepa: Shawnie Dapper Other Clinician: Referring Karrissa Parchment: Treating Carolin Quang/Extender: Ruffin Pyo in Treatment: 5 Wound Status Wound Number: 2 Primary Pressure Ulcer Etiology: Wound Location: Left Labia Wound Open Wounding Event: Pressure Injury Status: Date Acquired: 03/20/2020 Date Acquired: 03/20/2020 Comorbid Cataracts, Anemia, Asthma, Sleep Apnea, Hypertension, Type II Weeks Of Treatment: 5 History: Diabetes, Osteoarthritis, Neuropathy, Received Chemotherapy, Clustered Wound: No Received Radiation, Confinement Anxiety Photos  Wound Measurements Length: (cm) Width: (cm) Depth: (cm) Area: (cm) Volume: (cm) 0 % Reduction in Area: 100% 0 % Reduction in Volume: 100% 0 Epithelialization: Large (67-100%) 0 Tunneling: No 0 Undermining: No Wound Description Classification: Category/Stage III Exudate Amount: None Present Foul Odor After Cleansing: No Slough/Fibrino No Wound  Bed Granulation Amount: None Present (0%) Exposed Structure Necrotic Amount: None Present (0%) Fascia Exposed: No Fat Layer (Subcutaneous Tissue) Exposed: No Tendon Exposed: No Muscle Exposed: No Joint Exposed: No Bone Exposed: No Electronic Signature(s) Signed: 11/03/2020 5:08:38 PM By: Baruch Gouty RN, BSN Entered By: Baruch Gouty on 11/03/2020 12:56:57 -------------------------------------------------------------------------------- [facility] [place] Details Patient Name: Date of Service: Allison Porta E. 11/03/2020 12:30 PM Medical Record Number: [id number] Patient Account Number: [id number] Date of Birth/Sex: Treating RN: 1950-10-07 (70 y.o. Elam Dutch Primary Care Allis Quirarte: Shawnie Dapper Other Clinician: Referring Abdullah Rizzi: Treating Lamarco Gudiel/Extender: Ruffin Pyo in Treatment: 5 Vital Signs Time Taken: 12:44 Temperature (F): 98.2 Height (in): 61 Pulse (bpm): 87 Source: Stated Respiratory Rate (breaths/min): 18 Weight (lbs): 260 Blood Pressure (mmHg): 168/76 Source: Stated Capillary Blood Glucose (mg/dl): 149 Body Mass Index (BMI): 49.1 Reference Range: 80 - 120 mg / dl Notes glucose per pt report this am Electronic Signature(s) Signed: 11/03/2020 5:08:38 PM By: Baruch Gouty RN, BSN Entered By: Baruch Gouty on 11/03/2020 12:45:19

## 2020-11-19 ENCOUNTER — Telehealth: Payer: Self-pay

## 2020-11-19 NOTE — Telephone Encounter (Signed)
Inbound fax from [place]  requesting medical notes. Clinical notes routed to [phone] via Epic

## 2020-11-25 ENCOUNTER — Other Ambulatory Visit: Payer: Self-pay

## 2020-11-25 ENCOUNTER — Ambulatory Visit (INDEPENDENT_AMBULATORY_CARE_PROVIDER_SITE_OTHER): Payer: Medicare Other | Admitting: Family Medicine

## 2020-11-25 ENCOUNTER — Encounter: Payer: Self-pay | Admitting: Family Medicine

## 2020-11-25 VITALS — BP 152/75 | HR 97 | Temp 97.7°F | Resp 16 | Ht 61.0 in | Wt 265.4 lb

## 2020-11-25 DIAGNOSIS — N3000 Acute cystitis without hematuria: Secondary | ICD-10-CM | POA: Diagnosis not present

## 2020-11-25 DIAGNOSIS — R300 Dysuria: Secondary | ICD-10-CM

## 2020-11-25 LAB — POCT URINALYSIS DIPSTICK
Blood, UA: NEGATIVE
Glucose, UA: NEGATIVE
Ketones, UA: NEGATIVE
Nitrite, UA: POSITIVE
Protein, UA: POSITIVE — AB
Spec Grav, UA: <=1.005 — AB (ref 1.010–1.025)
Urobilinogen, UA: 4 E.U./dL — AB
pH, UA: 8 (ref 5.0–8.0)

## 2020-11-25 MED ORDER — SULFAMETHOXAZOLE-TRIMETHOPRIM 800-160 MG PO TABS: 1.0000 | ORAL_TABLET | Freq: Two times a day (BID) | ORAL | 0 refills | Status: DC

## 2020-11-25 NOTE — Progress Notes (Signed)
OFFICE VISIT  11/25/2020  CC:  Chief Complaint  Patient presents with   Urinary Frequency    X 5 days;Pain with urination and cloudy    HPI:    Patient is a 70 y.o. Caucasian female who presents for "urinary frequency and pain with urination". Onset about 5 d/a, burning with urination, much increased urgency and frequency. Tired but no fever, flank, pain, abd pain, or nausea.  Has been taking otc azo up until today.  Past Medical History:  Diagnosis Date   Allergic rhinitis    Allergy testing: results pending as of 05/28/16 (Dr. Harold Hedge)   Anemia 10/2020   anemia of chronic dz (? + mild IDA?--iron started   Arthritis    Chemotherapy-induced neuropathy ([place]) 2019   Cough variant asthma    DDD (degenerative disc disease), lumbar    Diabetes mellitus with complication ([place])    Mild nonprolif DR R eye 12/2017-->referred to retinal specialist DM MANAGED BY DR. GHERGHE AS OF 2021   Endometrial cancer ([place]) 02/2018   REMISSION AS OF 08/2018.  Stage III (T3, Nx, Mx)  Path: endometroid adenocarcinoma involving cervix, uterus, and fallopian tubes (Pt got TAH & BSO 02/2018). No sign of recurrence as of onc f/u 05/2019.   GERD (gastroesophageal reflux disease)    Hemorrhoids    Herpes zoster 10/2015   L side belt-line   History of adenomatous polyp of colon 02/25/2013   5 mm cecal polyp removed by Dr. Trevor Mace 5 yrs   History of blood transfusion    History of cellulitis 08/2010   Left (Since replacemnt of Left knee   Hyperkalemia 03/2018   started after pt started getting chemo-->had to stop enalapril.   Hyperlipidemia    Not on statin b/c lipids "stayed down when sugars came down" per pt.  She says Dr. Chalmers Cater knows she is not on statin anymore.   Hypertension    Hypothyroidism    Nephrolithiasis 07/2005   Obesity    OSA on CPAP    Osteoarthritis    knees, ankle, + ? scapholunate ligament disruption (x-ray 06/2017)--ortho referral.   Pancytopenia due to antineoplastic  chemotherapy ([place])    progressive as of 06/2018. Stable 08/2018.   Recurrent UTI    started cipro 250 qd 09/2018   Sciatica of left side    Severe persistent asthma    cough-variant--saw Allergist 05/25/16 and was switched from max dose advair to symbicort.   Systolic murmur    ECHO fine 10/2016-->murmur likely flow murmur assoc with HTN.   Zoon's vulvitis    Bx-proven (GYN) -lichen sclerosis.  Clobetasol 0.05% ointment per GYN    Past Surgical History:  Procedure Laterality Date   ANKLE FRACTURE SURGERY  1984   Pin & repair   ARTHROSCOPIC REPAIR ACL  2000   Due to ACL tear   ARTHROSCOPIC REPAIR ACL  5/08   Blateral knee rerlacements x4 2 times each knee     BREAST BIOPSY Right 2014   Benign   CARDIAC CATHETERIZATION  5/07   clear vessel mild mitral    CARPAL TUNNEL RELEASE Right [id number]   1989 Left   CESAREAN SECTION  1985   CHOLECYSTECTOMY OPEN  1988   COLONOSCOPY N/A 02/25/2013   Tubular adenoma x 1: Recall 5 yrs. Procedure: COLONOSCOPY;  Surgeon: Juanita Craver, MD;  Location: [facility];  Service: Endoscopy;  Laterality: N/A;   COMBINED HYSTEROSCOPY DIAGNOSTIC / D&C  2/12   Bx neg   DEXA  08/2007; 05/12/20  Bone density normal 2009 and 2022.  Plan rpt 2024.   DILATION AND CURETTAGE OF UTERUS  12/11   IR IMAGING GUIDED PORT INSERTION  03/16/2018   IR REMOVAL TUN ACCESS W/ PORT W/O FL MOD SED  09/20/2018   LYMPH NODE BIOPSY N/A 02/19/2018   Procedure: Sentinel LYMPH NODE BIOPSY;  Surgeon: Everitt Amber, MD;  Location: [facility];  Service: Gynecology;  Laterality: N/A;   REPLACEMENT TOTAL KNEE Left 5/12   X 2 on each   ROBOTIC ASSISTED TOTAL HYSTERECTOMY WITH BILATERAL SALPINGO OOPHERECTOMY N/A 02/19/2018   For endometrial cancer.  Procedure: XI ROBOTIC ASSISTED TOTAL HYSTERECTOMY WITH BILATERAL SALPINGO OOPHORECTOMY;  Surgeon: Everitt Amber, MD;  Location: [place];  Service: Gynecology;  Laterality: N/A;   TRANSTHORACIC ECHOCARDIOGRAM   10/11/2016    EF 55-60%, grd I DD.   TUBAL LIGATION  1985   C-Section    Outpatient Medications Prior to Visit  Medication Sig Dispense Refill   albuterol (PROVENTIL) (2.5 MG/3ML) 0.083% nebulizer solution Take 3 mLs (2.5 mg total) by nebulization every 4 (four) hours as needed for wheezing or shortness of breath (Dx: J45.50). 75 mL 1   albuterol (VENTOLIN HFA) 108 (90 Base) MCG/ACT inhaler INHALE 2 PUFFS INTO LUNGS EVERY 6 HOURS AS NEEDED FOR WHEEZING/SHORTNESS OF BREATH 18 each 1   amitriptyline (ELAVIL) 25 MG tablet 2 tabs po qhs 180 tablet 3   clobetasol ointment (TEMOVATE) [id number] % Apply 1 application topically at bedtime. Apply to the skin of the vulva for 12 weeks 30 g 2   Continuous Blood Gluc Receiver (FREESTYLE LIBRE 14 DAY READER) DEVI by Does not apply route.     diclofenac Sodium (VOLTAREN) 1 % GEL Apply 2 g topically 4 (four) times daily. 100 g 3   diphenoxylate-atropine (LOMOTIL) 2.5-0.025 MG tablet 1-2 tabs po qid prn diarrhea 30 tablet 2   felodipine (PLENDIL) 10 MG 24 hr tablet TAKE 1 TABLET BY MOUTH EVERY DAY 90 tablet 1   fluticasone (CUTIVATE) 0.05 % cream APPLY TO AFFECTED AREA OF R LOWER LEG TWICE PER DAY. 30 g 0   fluticasone (VERAMYST) 27.5 MCG/SPRAY nasal spray Place 1 spray into the nose daily as needed for rhinitis.     furosemide (LASIX) 20 MG tablet TAKE 1 TABLET BY MOUTH EVERY DAY 90 tablet 1   HYDROcodone-acetaminophen (NORCO/VICODIN) 5-325 MG tablet 1-2 tabs po bid prn pain 90 tablet 0   insulin aspart (NOVOLOG) 100 UNIT/ML injection USE 20-25 units under skin 3x a day before meals 90 mL 3   insulin NPH Human (NOVOLIN N) 100 UNIT/ML injection INJECT 30 UNITS UNDER THE SKIN 3 TIMES A DAY 30 mL 6   Insulin Syringes, Disposable, U-100 0.3 ML MISC Use to inject insulin--6 injections per day 540 each 3   levocetirizine (XYZAL) 5 MG tablet Take 5 mg by mouth at bedtime as needed for allergies.   5   levothyroxine (SYNTHROID) 125 MCG tablet TAKE 1 TABLET BY MOUTH EVERY  DAY 90 tablet 0   metFORMIN (GLUCOPHAGE) 500 MG tablet TAKE 1 TABLET BY MOUTH EVERY MORNING AND 2 TABLETS BY MOUTH AT BEDTIME. 270 tablet 3   miconazole (MICOTIN) 2 % powder Apply 1 application topically 2 (two) times daily as needed for itching.     pravastatin (PRAVACHOL) 40 MG tablet TAKE 1 TABLET BY MOUTH EVERY DAY 90 tablet 0   Probiotic Product (PROBIOTIC PO) Take 1 capsule by mouth daily.     SYMBICORT 160-4.5 MCG/ACT  inhaler INHALE 2 PUFFS BY MOUTH TWICE A DAY 30.6 each 3   No facility-administered medications prior to visit.    Allergies  Allergen Reactions   Adhesive [Tape] Other (See Comments)    Takes patient's skin off.   Banana Diarrhea and Nausea Only   Eggs Or Egg-Derived Products Diarrhea and Nausea Only   Latex Other (See Comments)    sensativity   Linzess [Linaclotide] Diarrhea   Ozempic (0.25 Or 0.5 Mg-Dose) [Semaglutide(0.25 Or 0.'5mg'$ -Dos)] Diarrhea   Penicillins Rash and Other (See Comments)    Has had cephalosporins without incident   Pine Swelling and Rash   Rose Swelling and Rash    ROS As per HPI  PE: Vitals with BMI 11/25/2020 10/28/2020 09/01/2020  Height '5\' 1"'$  '5\' 1"'$  '5\' 1"'$   Weight 265 lbs 6 oz 262 lbs 6 oz 260 lbs 3 oz  BMI 50.17 [id number]  Systolic 0000000 [id number] 0000000  Diastolic 75 67 82  Pulse 97 95 103     Gen: Alert, tired but well appearing.  Patient is oriented to person, place, time, and situation. AFFECT: pleasant, lucid thought and speech. BACK: no cva tenderness.  LABS:    Chemistry      Component Value Date/Time   NA 142 10/28/2020 1348   K 3.9 10/28/2020 1348   CL 103 10/28/2020 1348   CO2 26 10/28/2020 1348   BUN 33 (H) 10/28/2020 1348   CREATININE 0.86 10/28/2020 1348   CREATININE 0.92 07/17/2018 1028      Component Value Date/Time   CALCIUM 9.4 10/28/2020 1348   ALKPHOS 70 10/28/2020 1348   AST 15 10/28/2020 1348   AST 38 07/17/2018 1028   ALT 16 10/28/2020 1348   ALT 28 07/17/2018 1028   BILITOT 0.2 10/28/2020 1348    BILITOT 0.4 07/17/2018 1028     Lab Results  Component Value Date   HGBA1C 6.3 (A) 09/01/2020   POC CC dipstick UA today: + nitrite, 4+ leuks, +urobili, + protein (She last took AZO yesterday)  IMPRESSION AND PLAN:  Acute cystitis w/out hematuria. Her UA could be falsely abnl d/t recently taking AZO but her symptoms are classic. Will start bactrim x 3d and send for c/s.  An After Visit Summary was printed and given to the patient.  FOLLOW UP: Return if symptoms worsen or fail to improve.  Signed:  Crissie Sickles, MD           11/25/2020

## 2020-11-27 LAB — URINE CULTURE
MICRO NUMBER:: 12257515
SPECIMEN QUALITY:: ADEQUATE

## 2020-12-11 ENCOUNTER — Encounter: Payer: Self-pay | Admitting: Radiology

## 2020-12-16 NOTE — Progress Notes (Signed)
Radiation Oncology         [phone] ________________________________  Name: [name] MRN: [id number]  Date: 12/17/2020  DOB: 1951/03/27  Follow-Up Visit Note  CC: McGowen, Adrian Blackwater, MD  Everitt Amber, MD    ICD-10-CM   1. Endometrial cancer ([place])  C54.1       Diagnosis: Stage IIIA endometroid endometrial carcinoma    Interval Since Last Radiation:  2 years, 6 months, and 29 days   Radiation treatment dates: 04/24/2018, 05/03/2018, 05/07/2018, 05/16/2018, 05/21/2018   Site/dose:  Vaginal cuff, 6 Gy in 5 fractions for a total dose of 30 Gy  Narrative:  The patient returns today for routine follow-up, she was last seen by me on 12//02/21. Since then, the patient was seen by Dr. Denman George on 06/16/20. The patient was noted at this time to be doing well overall, with the exception of persistent neuropathy, fatigue, and vulvar irritation since completing chemotherapy in April of 2020.  Pertinent imaging thus far includes:  --Bilateral breast mammogram on 05/12/20 demonstrating no mammographic evidence of malignancy. --CT of the abdomen and pelvis on 06/25/20 which demonstrated no evidence for metastatic disease in the abdomen or pelvis   Of note: the patient has been following up with her endocrinologist and  Dr. Dellia Nims, [place],  in regards to left labia and right upper thigh pressure ulcers. Per Dr. Janalyn Rouse note on 11/03/20, the pressure ulcers first appeared this past December. Pressure ulcers have been covered with silver alginate and foam covers and maintained by the patient at home.   Patient reports these pressure ulcers have now healed.                              Allergies:  is allergic to adhesive [tape], banana, eggs or egg-derived products, latex, linzess [linaclotide], ozempic (0.25 or 0.5 mg-dose) [semaglutide(0.25 or 0.'5mg'$ -dos)], penicillins, pine, and rose.  Meds: Current Outpatient Medications  Medication Sig Dispense Refill    albuterol (PROVENTIL) (2.5 MG/3ML) 0.083% nebulizer solution Take 3 mLs (2.5 mg total) by nebulization every 4 (four) hours as needed for wheezing or shortness of breath (Dx: J45.50). 75 mL 1   albuterol (VENTOLIN HFA) 108 (90 Base) MCG/ACT inhaler INHALE 2 PUFFS INTO LUNGS EVERY 6 HOURS AS NEEDED FOR WHEEZING/SHORTNESS OF BREATH 18 each 1   amitriptyline (ELAVIL) 25 MG tablet 2 tabs po qhs 180 tablet 3   clobetasol ointment (TEMOVATE) [id number] % Apply 1 application topically at bedtime. Apply to the skin of the vulva for 12 weeks 30 g 2   Continuous Blood Gluc Receiver (FREESTYLE LIBRE 14 DAY READER) DEVI by Does not apply route.     diclofenac Sodium (VOLTAREN) 1 % GEL Apply 2 g topically 4 (four) times daily. 100 g 3   diphenoxylate-atropine (LOMOTIL) 2.5-0.025 MG tablet 1-2 tabs po qid prn diarrhea 30 tablet 2   felodipine (PLENDIL) 10 MG 24 hr tablet TAKE 1 TABLET BY MOUTH EVERY DAY 90 tablet 1   fluticasone (CUTIVATE) 0.05 % cream APPLY TO AFFECTED AREA OF R LOWER LEG TWICE PER DAY. 30 g 0   fluticasone (VERAMYST) 27.5 MCG/SPRAY nasal spray Place 1 spray into the nose daily as needed for rhinitis.     furosemide (LASIX) 20 MG tablet TAKE 1 TABLET BY MOUTH EVERY DAY 90 tablet 1   HYDROcodone-acetaminophen (NORCO/VICODIN) 5-325 MG tablet 1-2 tabs po bid prn pain 90 tablet 0   insulin aspart (NOVOLOG)  100 UNIT/ML injection USE 20-25 units under skin 3x a day before meals 90 mL 3   insulin NPH Human (NOVOLIN N) 100 UNIT/ML injection INJECT 30 UNITS UNDER THE SKIN 3 TIMES A DAY 30 mL 6   Insulin Syringes, Disposable, U-100 0.3 ML MISC Use to inject insulin--6 injections per day 540 each 3   levocetirizine (XYZAL) 5 MG tablet Take 5 mg by mouth at bedtime as needed for allergies.   5   levothyroxine (SYNTHROID) 125 MCG tablet TAKE 1 TABLET BY MOUTH EVERY DAY 90 tablet 0   metFORMIN (GLUCOPHAGE) 500 MG tablet TAKE 1 TABLET BY MOUTH EVERY MORNING AND 2 TABLETS BY MOUTH AT BEDTIME. 270 tablet 3    miconazole (MICOTIN) 2 % powder Apply 1 application topically 2 (two) times daily as needed for itching.     pravastatin (PRAVACHOL) 40 MG tablet TAKE 1 TABLET BY MOUTH EVERY DAY 90 tablet 0   SYMBICORT 160-4.5 MCG/ACT inhaler INHALE 2 PUFFS BY MOUTH TWICE A DAY 30.6 each 3   Probiotic Product (PROBIOTIC PO) Take 1 capsule by mouth daily. (Patient not taking: Reported on 12/17/2020)     No current facility-administered medications for this encounter.    Physical Findings: The patient is in no acute distress. Patient is alert and oriented.  height is '5\' 1"'$  (1.549 m) and weight is 257 lb (116.6 kg). Her temperature is 97.9 F (36.6 C). Her blood pressure is 150/60 (abnormal) and her pulse is 87. Her respiration is 20 and oxygen saturation is 97%. .  No significant changes. Lungs are clear to auscultation bilaterally. Heart has regular rate and rhythm. No palpable cervical, supraclavicular, or axillary adenopathy. Abdomen soft, non-tender, normal bowel sounds.   On pelvic examination the external genitalia were unremarkable. A speculum exam was performed. There are no mucosal lesions noted in the vaginal vault.  View of the vaginal cuff area shows no mucosal lesions or nodules.  On bimanual examination there were no pelvic masses appreciated.  Patient was noted to have a yeast infection along the left inguinal area for which she is applying antifungal powder.  No residual ulcers noted along the thigh or external genitalia.    Lab Findings: Lab Results  Component Value Date   WBC 7.6 10/28/2020   HGB 11.3 (L) 10/28/2020   HCT 33.6 (L) 10/28/2020   MCV 92.1 10/28/2020   PLT 242.0 10/28/2020    Radiographic Findings: No results found.  Impression:  Stage IIIA endometroid endometrial carcinoma    No evidence of recurrence on clinical exam today  Plan: The patient will follow-up with Dr. Berline Lopes in 6 months.  Follow-up in radiation oncology in 1 year.   25 minutes of total time was spent  for this patient encounter, including preparation, face-to-face counseling with the patient and coordination of care, physical exam, and documentation of the encounter. ____________________________________  Blair Promise, PhD, MD   This document serves as a record of services personally performed by Gery Pray, MD. It was created on his behalf by Roney Mans, a trained medical scribe. The creation of this record is based on the scribe's personal observations and the provider's statements to them. This document has been checked and approved by the attending provider.

## 2020-12-17 ENCOUNTER — Encounter: Payer: Self-pay | Admitting: Radiation Oncology

## 2020-12-17 ENCOUNTER — Other Ambulatory Visit: Payer: Self-pay

## 2020-12-17 ENCOUNTER — Ambulatory Visit
Admission: RE | Admit: 2020-12-17 | Discharge: 2020-12-17 | Disposition: A | Discharge status: Home / Self Care | Payer: Medicare Other | Source: Ambulatory Visit | Attending: Radiation Oncology | Admitting: Radiation Oncology

## 2020-12-17 DIAGNOSIS — Z923 Personal history of irradiation: Secondary | ICD-10-CM | POA: Diagnosis not present

## 2020-12-17 DIAGNOSIS — Z7984 Long term (current) use of oral hypoglycemic drugs: Secondary | ICD-10-CM | POA: Diagnosis not present

## 2020-12-17 DIAGNOSIS — C541 Malignant neoplasm of endometrium: Secondary | ICD-10-CM

## 2020-12-17 DIAGNOSIS — Z79899 Other long term (current) drug therapy: Secondary | ICD-10-CM | POA: Insufficient documentation

## 2020-12-17 DIAGNOSIS — Z08 Encounter for follow-up examination after completed treatment for malignant neoplasm: Secondary | ICD-10-CM | POA: Diagnosis not present

## 2020-12-17 DIAGNOSIS — Z8542 Personal history of malignant neoplasm of other parts of uterus: Secondary | ICD-10-CM | POA: Diagnosis not present

## 2020-12-17 DIAGNOSIS — Z791 Long term (current) use of non-steroidal anti-inflammatories (NSAID): Secondary | ICD-10-CM | POA: Insufficient documentation

## 2020-12-17 NOTE — Progress Notes (Signed)
[name] is here today for follow up post radiation to the pelvic.  They completed their radiation on: 05/21/18  Does the patient complain of any of the following:  Pain:Patient reports having chronic pain.  Abdominal bloating: no Diarrhea/Constipation: intermittent Nausea/Vomiting: no Vaginal Discharge: no Blood in Urine or Stool: no Urinary Issues (dysuria/incomplete emptying/ incontinence/ increased frequency/urgency): no Does patient report using vaginal dilator 2-3 times a week and/or sexually active 2-3 weeks: Patient reports using vaginal dilator.  Post radiation skin changes: skin remains intact.   Additional comments if applicable:   Vitals:   12/17/20 1130  BP: (!) 150/60  Pulse: 87  Resp: 20  Temp: 97.9 F (36.6 C)  SpO2: 97%  Weight: 257 lb (116.6 kg)  Height: '5\' 1"'$  (1.549 m)

## 2021-01-01 ENCOUNTER — Other Ambulatory Visit: Payer: Self-pay | Admitting: Family Medicine

## 2021-01-02 ENCOUNTER — Other Ambulatory Visit: Payer: Self-pay | Admitting: Family Medicine

## 2021-01-05 LAB — HM DIABETES EYE EXAM

## 2021-01-06 ENCOUNTER — Other Ambulatory Visit: Payer: Self-pay | Admitting: Internal Medicine

## 2021-01-06 ENCOUNTER — Other Ambulatory Visit: Payer: Self-pay | Admitting: Family Medicine

## 2021-01-06 MED ORDER — HYDROCODONE-ACETAMINOPHEN 5-325 MG PO TABS: ORAL_TABLET | ORAL | 0 refills | Status: DC

## 2021-01-06 NOTE — Telephone Encounter (Signed)
Requesting: [facility] Contract: 12/18/19 UDS:12/18/19 Last Visit:11/25/20 Next Visit:01/27/21 Last Refill: 10/28/20(90,0)  Please Advise, med pending

## 2021-01-06 NOTE — Telephone Encounter (Signed)
[facility] eRx'd

## 2021-01-18 DIAGNOSIS — E119 Type 2 diabetes mellitus without complications: Secondary | ICD-10-CM | POA: Diagnosis not present

## 2021-01-18 DIAGNOSIS — H2511 Age-related nuclear cataract, right eye: Secondary | ICD-10-CM | POA: Diagnosis not present

## 2021-01-18 DIAGNOSIS — H25013 Cortical age-related cataract, bilateral: Secondary | ICD-10-CM | POA: Diagnosis not present

## 2021-01-18 DIAGNOSIS — H2513 Age-related nuclear cataract, bilateral: Secondary | ICD-10-CM | POA: Diagnosis not present

## 2021-01-18 DIAGNOSIS — H25043 Posterior subcapsular polar age-related cataract, bilateral: Secondary | ICD-10-CM | POA: Diagnosis not present

## 2021-01-27 ENCOUNTER — Ambulatory Visit: Payer: Medicare Other | Admitting: Family Medicine

## 2021-02-02 ENCOUNTER — Ambulatory Visit (INDEPENDENT_AMBULATORY_CARE_PROVIDER_SITE_OTHER): Payer: Medicare Other | Admitting: Family Medicine

## 2021-02-02 ENCOUNTER — Encounter: Payer: Self-pay | Admitting: Family Medicine

## 2021-02-02 ENCOUNTER — Other Ambulatory Visit: Payer: Self-pay

## 2021-02-02 VITALS — BP 154/71 | HR 88 | Temp 97.6°F | Ht 61.0 in | Wt 261.0 lb

## 2021-02-02 DIAGNOSIS — E039 Hypothyroidism, unspecified: Secondary | ICD-10-CM | POA: Diagnosis not present

## 2021-02-02 DIAGNOSIS — E7800 Pure hypercholesterolemia, unspecified: Secondary | ICD-10-CM

## 2021-02-02 DIAGNOSIS — D649 Anemia, unspecified: Secondary | ICD-10-CM

## 2021-02-02 DIAGNOSIS — Z79899 Other long term (current) drug therapy: Secondary | ICD-10-CM

## 2021-02-02 DIAGNOSIS — G894 Chronic pain syndrome: Secondary | ICD-10-CM | POA: Diagnosis not present

## 2021-02-02 DIAGNOSIS — I10 Essential (primary) hypertension: Secondary | ICD-10-CM | POA: Diagnosis not present

## 2021-02-02 LAB — BASIC METABOLIC PANEL
BUN: 29 mg/dL — ABNORMAL HIGH (ref 6–23)
CO2: 28 mEq/L (ref 19–32)
Calcium: 9.2 mg/dL (ref 8.4–10.5)
Chloride: 100 mEq/L (ref 96–112)
Creatinine, Ser: 0.84 mg/dL (ref 0.40–1.20)
GFR: 70.57 mL/min (ref >60.00)
Glucose, Bld: 151 mg/dL — ABNORMAL HIGH (ref 70–99)
Potassium: 4 mEq/L (ref 3.5–5.1)
Sodium: 140 mEq/L (ref 135–145)

## 2021-02-02 LAB — LIPID PANEL
Cholesterol: 109 mg/dL (ref 0–200)
HDL: 37.8 mg/dL — ABNORMAL LOW (ref >39.00)
LDL Cholesterol: 41 mg/dL (ref 0–99)
NonHDL: 71.37
Total CHOL/HDL Ratio: 3
Triglycerides: 150 mg/dL — ABNORMAL HIGH (ref 0.0–149.0)
VLDL: 30 mg/dL (ref 0.0–40.0)

## 2021-02-02 LAB — TSH: TSH: 0.76 u[IU]/mL (ref 0.35–5.50)

## 2021-02-02 LAB — CBC
HCT: 34.9 % — ABNORMAL LOW (ref 36.0–46.0)
Hemoglobin: 11.5 g/dL — ABNORMAL LOW (ref 12.0–15.0)
MCHC: 32.9 g/dL (ref 30.0–36.0)
MCV: 94.4 fl (ref 78.0–100.0)
Platelets: 224 10*3/uL (ref 150.0–400.0)
RBC: 3.69 Mil/uL — ABNORMAL LOW (ref 3.87–5.11)
RDW: 15.7 % — ABNORMAL HIGH (ref 11.5–15.5)
WBC: 8.9 10*3/uL (ref 4.0–10.5)

## 2021-02-02 MED ORDER — HYDROCODONE-ACETAMINOPHEN 5-325 MG PO TABS: ORAL_TABLET | ORAL | 0 refills | Status: DC

## 2021-02-02 MED ORDER — CLINDAMYCIN HCL 300 MG PO CAPS: 300.0000 mg | ORAL_CAPSULE | Freq: Three times a day (TID) | ORAL | 0 refills | Status: AC

## 2021-02-02 MED ORDER — FLUTICASONE PROPIONATE 0.05 % EX CREA: TOPICAL_CREAM | CUTANEOUS | 3 refills | Status: DC

## 2021-02-02 NOTE — Progress Notes (Signed)
OFFICE VISIT  02/02/2021  CC:  Chief Complaint  Patient presents with   Follow-up    RCI, pt is fasting   HPI:    Patient is a 70 y.o. female who presents for 3 mo f/u chronic pain syndrome, HTN, HLD, hypothyroidism, and asthma. A/P as of last visit: "1) Chronic pain syndrome:   Stable on vicodin 5/325, 1-2 bid prn, #90--new rx today. Continue amitriptyline 50mg  qhs. CSC UTD.   2) HTN: stable on felodipine 10mg  qd. Lytes/cr today.   3) HLD: tolerating pravastatin 40mg  qd. Not fasting today. FLP rpt when able if pt fasting at a future appt.   Hepatic panel today.   4) Moderate/severe persistent asthma: stable on symbicort 160/4.5, 2 p bid.  Cont albut q4h prn?rescue as well.   5) Hypothyroidism; 125 mcg T4 qd.  TSH has been stable last few years. Plan annual TSH in 3 mo. She is not on iron supp or b12 supp: takes 1 women's MVI.   6) Normocytic anemia, chronic. ? Hx of vit B12 deficienty--hx unclear. Check Cbc, iron panel, vit b12 levels today.   7) DM 2, great control now on metformin and insulin--> working with Dr. Cruzita Lederer in Endo and this is going well."  INTERIM HX: Doing pretty well overall but has noticed some increased lower extremity swelling over the last week.  Occasionally misses a dose of Lasix.  Anterior aspects of lower legs significantly itchy and turning deep pink.  No pain in the lower legs and no fever or malaise. No home blood pressure monitoring data lately.  When she had checked it in the past it had been consistently around 130/80 average.  Indication for chronic opioid: osteoarthritis in hands, low back, feet, L hip, knees, wrists+chemotherapy -induced neuropathy (burning and numbness in feet). PMP AWARE reviewed today: most recent rx for vicodin 5/325 was filled 01/06/21, # 22, rx by me. No red flags.  Her breathing has felt normal lately.  Hypoth: Takes T4 on empty stomach w/out any other meds.  HLD: tolerating pravastatin long term.  ROS as  above, plus--> no CP, no SOB, no wheezing, no cough, no dizziness, no HAs, no melena/hematochezia.  No polyuria or polydipsia. No focal weakness, paresthesias, or tremors.  No acute vision or hearing abnormalities.  No dysuria or unusual/new urinary urgency or frequency.  No n/v/d or abd pain.  No palpitations.    Past Medical History:  Diagnosis Date   Allergic rhinitis    Allergy testing: results pending as of 05/28/16 (Dr. Harold Hedge)   Anemia 10/2020   anemia of chronic dz (? + mild IDA?--iron started   Arthritis    Chemotherapy-induced neuropathy ([place]) 2019   Cough variant asthma    DDD (degenerative disc disease), lumbar    Diabetes mellitus with complication ([place])    Mild nonprolif DR R eye 12/2017-->referred to retinal specialist DM MANAGED BY DR. GHERGHE AS OF 2021   Endometrial cancer ([place]) 02/2018   REMISSION AS OF 08/2018.  Stage III (T3, Nx, Mx)  Path: endometroid adenocarcinoma involving cervix, uterus, and fallopian tubes (Pt got TAH & BSO 02/2018). No sign of recurrence as of onc f/u 05/2019.   GERD (gastroesophageal reflux disease)    Hemorrhoids    Herpes zoster 10/2015   L side belt-line   History of adenomatous polyp of colon 02/25/2013   5 mm cecal polyp removed by Dr. Trevor Mace 5 yrs   History of blood transfusion    History of cellulitis 08/2010  Left (Since replacemnt of Left knee   History of radiation therapy 05/21/2018   [facility] [place] brachytherapy 04/24/2018-05/21/2018  Dr Gery Pray   Hyperkalemia 03/2018   started after pt started getting chemo-->had to stop enalapril.   Hyperlipidemia    Not on statin b/c lipids "stayed down when sugars came down" per pt.  She says Dr. Chalmers Cater knows she is not on statin anymore.   Hypertension    Hypothyroidism    Nephrolithiasis 07/2005   Obesity    OSA on CPAP    Osteoarthritis    knees, ankle, + ? scapholunate ligament disruption (x-ray 06/2017)--ortho referral.   Pancytopenia due to antineoplastic chemotherapy ([place])     progressive as of 06/2018. Stable 08/2018.   Recurrent UTI    started cipro 250 qd 09/2018   Sciatica of left side    Severe persistent asthma    cough-variant--saw Allergist 05/25/16 and was switched from max dose advair to symbicort.   Systolic murmur    ECHO fine 10/2016-->murmur likely flow murmur assoc with HTN.   Zoon's vulvitis    Bx-proven (GYN) -lichen sclerosis.  Clobetasol 0.05% ointment per GYN    Past Surgical History:  Procedure Laterality Date   ANKLE FRACTURE SURGERY  1984   Pin & repair   ARTHROSCOPIC REPAIR ACL  2000   Due to ACL tear   ARTHROSCOPIC REPAIR ACL  5/08   Blateral knee rerlacements x4 2 times each knee     BREAST BIOPSY Right 2014   Benign   CARDIAC CATHETERIZATION  5/07   clear vessel mild mitral    CARPAL TUNNEL RELEASE Right [id number]   1989 Left   CESAREAN SECTION  1985   CHOLECYSTECTOMY OPEN  1988   COLONOSCOPY N/A 02/25/2013   Tubular adenoma x 1: Recall 5 yrs. Procedure: COLONOSCOPY;  Surgeon: Juanita Craver, MD;  Location: [facility];  Service: Endoscopy;  Laterality: N/A;   COMBINED HYSTEROSCOPY DIAGNOSTIC / D&C  2/12   Bx neg   DEXA  08/2007; 05/12/20   Bone density normal 2009 and 2022.  Plan rpt 2024.   DILATION AND CURETTAGE OF UTERUS  12/11   IR IMAGING GUIDED PORT INSERTION  03/16/2018   IR REMOVAL TUN ACCESS W/ PORT W/O FL MOD SED  09/20/2018   LYMPH NODE BIOPSY N/A 02/19/2018   Procedure: [place] LYMPH NODE BIOPSY;  Surgeon: Everitt Amber, MD;  Location: [facility];  Service: Gynecology;  Laterality: N/A;   REPLACEMENT TOTAL KNEE Left 5/12   X 2 on each   ROBOTIC ASSISTED TOTAL HYSTERECTOMY WITH BILATERAL SALPINGO OOPHERECTOMY N/A 02/19/2018   For endometrial cancer.  Procedure: XI ROBOTIC ASSISTED TOTAL HYSTERECTOMY WITH BILATERAL SALPINGO OOPHORECTOMY;  Surgeon: Everitt Amber, MD;  Location: [facility];  Service: Gynecology;  Laterality: N/A;   TRANSTHORACIC ECHOCARDIOGRAM  10/11/2016    EF 55-60%, grd I  DD.   TUBAL LIGATION  1985   C-Section    Outpatient Medications Prior to Visit  Medication Sig Dispense Refill   albuterol (PROVENTIL) (2.5 MG/3ML) 0.083% nebulizer solution Take 3 mLs (2.5 mg total) by nebulization every 4 (four) hours as needed for wheezing or shortness of breath (Dx: J45.50). 75 mL 1   albuterol (VENTOLIN HFA) 108 (90 Base) MCG/ACT inhaler INHALE 2 PUFFS INTO LUNGS EVERY 6 HOURS AS NEEDED FOR WHEEZING/SHORTNESS OF BREATH 18 each 1   amitriptyline (ELAVIL) 25 MG tablet 2 tabs po qhs 180 tablet 3   clobetasol ointment (TEMOVATE) 0.05 % Apply 1  application topically at bedtime. Apply to the skin of the vulva for 12 weeks 30 g 2   Continuous Blood Gluc Receiver (FREESTYLE LIBRE 14 DAY READER) DEVI by Does not apply route.     diclofenac Sodium (VOLTAREN) 1 % GEL Apply 2 g topically 4 (four) times daily. 100 g 3   diphenoxylate-atropine (LOMOTIL) 2.5-0.025 MG tablet 1-2 tabs po qid prn diarrhea 30 tablet 2   felodipine (PLENDIL) 10 MG 24 hr tablet TAKE 1 TABLET BY MOUTH EVERY DAY 90 tablet 0   fluticasone (VERAMYST) 27.5 MCG/SPRAY nasal spray Place 1 spray into the nose daily as needed for rhinitis.     furosemide (LASIX) 20 MG tablet TAKE 1 TABLET BY MOUTH EVERY DAY 90 tablet 1   insulin NPH Human (NOVOLIN N) 100 UNIT/ML injection INJECT 30 UNITS UNDER THE SKIN 3 TIMES A DAY 30 mL 6   Insulin Syringes, Disposable, U-100 0.3 ML MISC Use to inject insulin--6 injections per day 540 each 3   levocetirizine (XYZAL) 5 MG tablet Take 5 mg by mouth at bedtime as needed for allergies.   5   levothyroxine (SYNTHROID) 125 MCG tablet TAKE 1 TABLET BY MOUTH EVERY DAY 90 tablet 0   metFORMIN (GLUCOPHAGE) 500 MG tablet TAKE 1 TABLET BY MOUTH EVERY MORNING AND 2 TABLETS BY MOUTH AT BEDTIME. 270 tablet 3   miconazole (MICOTIN) 2 % powder Apply 1 application topically 2 (two) times daily as needed for itching.     NOVOLOG 100 UNIT/ML injection INJECT 20-25 UNITS INTO THE SKIN 3 TIMES A DAY  BEFORE MEALS 90 mL 1   pravastatin (PRAVACHOL) 40 MG tablet TAKE 1 TABLET BY MOUTH EVERY DAY 90 tablet 0   Probiotic Product (PROBIOTIC PO) Take 1 capsule by mouth daily.     SYMBICORT 160-4.5 MCG/ACT inhaler INHALE 2 PUFFS BY MOUTH TWICE A DAY 30.6 each 3   fluticasone (CUTIVATE) 0.05 % cream APPLY TO AFFECTED AREA OF R LOWER LEG TWICE PER DAY. 30 g 0   HYDROcodone-acetaminophen (NORCO/VICODIN) 5-325 MG tablet 1-2 tabs po bid prn pain 90 tablet 0   BESIVANCE 0.6 % SUSP Place 1 drop into the right eye 3 (three) times daily.     prednisoLONE acetate (PRED FORTE) 1 % ophthalmic suspension Place 1 drop into the right eye 4 (four) times daily.     PROLENSA 0.07 % SOLN SMARTSIG:1 Drop(s) Right Eye Every Evening     No facility-administered medications prior to visit.    Allergies  Allergen Reactions   Adhesive [Tape] Other (See Comments)    Takes patient's skin off.   Banana Diarrhea and Nausea Only   Eggs Or Egg-Derived Products Diarrhea and Nausea Only   Latex Other (See Comments)    sensativity   Linzess [Linaclotide] Diarrhea   Ozempic (0.25 Or 0.5 Mg-Dose) [Semaglutide(0.25 Or 0.5mg -Dos)] Diarrhea   Penicillins Rash and Other (See Comments)    Has had cephalosporins without incident   Pine Swelling and Rash   Rose Swelling and Rash    ROS As per HPI  PE: Vitals with BMI 02/02/2021 12/17/2020 11/25/2020  Height 5\' 1"  5\' 1"  5\' 1"   Weight 261 lbs 257 lbs 265 lbs 6 oz  BMI 49.34 16.10 96.04  Systolic 540 981 191  Diastolic 71 60 75  Pulse 88 87 97   Gen: Alert, well appearing.  Patient is oriented to person, place, time, and situation. AFFECT: pleasant, lucid thought and speech. CV: RRR, soft systolic ejection murmur, no r/g.  LUNGS: CTA bilat, nonlabored resps, good aeration in all lung fields. EXT: 4+ bilat pitting edema, R leg up to knee, left leg from mid tibial level into foot. R>L deep pinkish hue to anterior tibial surfaces, R side with tiny vesicles. No weeping.   Non-tender.  No streaking.  No ulceration. R thumb TTP over extensor pollicis brevis and abductor pollicis longus as well as 1st MCP jt.  +Finkelstein's.  LABS:  Lab Results  Component Value Date   TSH 2.88 12/18/2019   Lab Results  Component Value Date   WBC 7.6 10/28/2020   HGB 11.3 (L) 10/28/2020   HCT 33.6 (L) 10/28/2020   MCV 92.1 10/28/2020   PLT 242.0 10/28/2020   Lab Results  Component Value Date   [id number] 725 10/28/2020    Lab Results  Component Value Date   IRON 58 10/28/2020   TIBC 391 10/28/2020   FERRITIN 26 10/28/2020    Lab Results  Component Value Date   CREATININE 0.86 10/28/2020   BUN 33 (H) 10/28/2020   NA 142 10/28/2020   K 3.9 10/28/2020   CL 103 10/28/2020   CO2 26 10/28/2020   Lab Results  Component Value Date   ALT 16 10/28/2020   AST 15 10/28/2020   ALKPHOS 70 10/28/2020   BILITOT 0.2 10/28/2020   Lab Results  Component Value Date   CHOL 172 12/18/2019   Lab Results  Component Value Date   HDL 39.60 12/18/2019   Lab Results  Component Value Date   LDLCALC 99 12/18/2019   Lab Results  Component Value Date   TRIG 164.0 (H) 12/18/2019   Lab Results  Component Value Date   CHOLHDL 4 12/18/2019   Lab Results  Component Value Date   HGBA1C 6.3 (A) 09/01/2020   IMPRESSION AND PLAN:  1) bilateral lower extremity edema d/t chronic venous insufficiency: Worse x1 week, suspect dietary sodium indiscretion.  Also, says may have missed a dose or 2 of her furosemide.  Now has some stasis dermatitis plus or minus cellulitis of bilateral lower extremities. Increase Lasix to 20 mg twice daily for the next 3 days, then resume 20 mg daily. Treat with clindamycin 300 mg 3 times daily x7 days. Apply Cutivate cream 0.05% twice daily to anterior surfaces of both lower legs.  2) HTN: bp up some today but historically 130/80 avg at home. No recent home bp data for comparison, though. Will have her restart daily home bp and hr checks and  review these at f/u in 2 wks. Her amlodipine is likely contributing some to her edema BUT her edema has never been to this degree while on this med in the past. Consider addition/switch to Beta 1 selective BB (bisoprolol or atenolol) if needed. Lytes/cr today.  3) HLD: tolerating pravastatin 40 qd long term. LDL 99 one year ago. Goal LDL <70. FLP and hepatic panel today.  4) Hypothyroidism; 125 mcg T4 qd.  TSH has been stable last few years. TSH today. She is not on iron supp or b12 supp: takes 1 women's MVI.  5) Chronic pain syndrome: osteoarthritis in hands, low back, feet, L hip, knees, wrists+chemotherapy -induced neuropathy (burning and numbness in feet). Chronic R wrist and thumb pain:  She has known osteoarthritis in these areas that she says ortho surgery recommended surgery/fusion.  Has only gotten a couple days of improvement in the past with 1st [facility] steroid injection.  She has evidence of DeQuervain's tenosynovitis as well (bedside u/s with fluid in  tendon sheath in transverse plane.   She just started thumb spica splint a couple days ago and says this usually helps. If not signif improved at f/u in 2 wks then consider steroid inj into tendon sheath.  CSC needs updating. UDS today.  An After Visit Summary was printed and given to the patient.  FOLLOW UP: Return in about 2 weeks (around 02/16/2021) for f/u HTN and LL edema/rash.  Signed:  Crissie Sickles, MD           02/02/2021

## 2021-02-03 ENCOUNTER — Other Ambulatory Visit: Payer: Self-pay | Admitting: Family Medicine

## 2021-02-07 ENCOUNTER — Other Ambulatory Visit: Payer: Self-pay | Admitting: Family Medicine

## 2021-02-08 DIAGNOSIS — H2511 Age-related nuclear cataract, right eye: Secondary | ICD-10-CM | POA: Insufficient documentation

## 2021-02-08 LAB — IRON,TIBC AND FERRITIN PANEL
%SAT: 13 % (calc) — ABNORMAL LOW (ref 16–45)
Ferritin: 31 ng/mL (ref 16–288)
Iron: 49 ug/dL (ref 45–160)
TIBC: 364 mcg/dL (calc) (ref 250–450)

## 2021-02-08 LAB — DRUG MONITORING PANEL 376104, URINE

## 2021-02-08 LAB — DM TEMPLATE

## 2021-02-09 DIAGNOSIS — E1136 Type 2 diabetes mellitus with diabetic cataract: Secondary | ICD-10-CM | POA: Diagnosis not present

## 2021-02-09 DIAGNOSIS — Z79899 Other long term (current) drug therapy: Secondary | ICD-10-CM | POA: Diagnosis not present

## 2021-02-09 DIAGNOSIS — Z91040 Latex allergy status: Secondary | ICD-10-CM | POA: Diagnosis not present

## 2021-02-09 DIAGNOSIS — Z7984 Long term (current) use of oral hypoglycemic drugs: Secondary | ICD-10-CM | POA: Diagnosis not present

## 2021-02-09 DIAGNOSIS — E039 Hypothyroidism, unspecified: Secondary | ICD-10-CM | POA: Diagnosis not present

## 2021-02-09 DIAGNOSIS — E1140 Type 2 diabetes mellitus with diabetic neuropathy, unspecified: Secondary | ICD-10-CM | POA: Diagnosis not present

## 2021-02-09 DIAGNOSIS — Z880 Allergy status to penicillin: Secondary | ICD-10-CM | POA: Diagnosis not present

## 2021-02-09 DIAGNOSIS — Z888 Allergy status to other drugs, medicaments and biological substances status: Secondary | ICD-10-CM | POA: Diagnosis not present

## 2021-02-09 DIAGNOSIS — I10 Essential (primary) hypertension: Secondary | ICD-10-CM | POA: Diagnosis not present

## 2021-02-09 DIAGNOSIS — Z6841 Body Mass Index (BMI) 40.0 and over, adult: Secondary | ICD-10-CM | POA: Diagnosis not present

## 2021-02-09 DIAGNOSIS — Z794 Long term (current) use of insulin: Secondary | ICD-10-CM | POA: Diagnosis not present

## 2021-02-09 DIAGNOSIS — E785 Hyperlipidemia, unspecified: Secondary | ICD-10-CM | POA: Diagnosis not present

## 2021-02-09 DIAGNOSIS — Z91048 Other nonmedicinal substance allergy status: Secondary | ICD-10-CM | POA: Diagnosis not present

## 2021-02-09 DIAGNOSIS — H2511 Age-related nuclear cataract, right eye: Secondary | ICD-10-CM | POA: Diagnosis not present

## 2021-02-09 DIAGNOSIS — G4730 Sleep apnea, unspecified: Secondary | ICD-10-CM | POA: Diagnosis not present

## 2021-02-09 DIAGNOSIS — J45909 Unspecified asthma, uncomplicated: Secondary | ICD-10-CM | POA: Diagnosis not present

## 2021-02-10 DIAGNOSIS — H2512 Age-related nuclear cataract, left eye: Secondary | ICD-10-CM | POA: Diagnosis not present

## 2021-02-11 ENCOUNTER — Encounter: Payer: Self-pay | Admitting: Internal Medicine

## 2021-02-17 ENCOUNTER — Ambulatory Visit (INDEPENDENT_AMBULATORY_CARE_PROVIDER_SITE_OTHER): Payer: Medicare Other | Admitting: Family Medicine

## 2021-02-17 ENCOUNTER — Other Ambulatory Visit: Payer: Self-pay

## 2021-02-17 ENCOUNTER — Encounter: Payer: Self-pay | Admitting: Family Medicine

## 2021-02-17 VITALS — BP 161/82 | HR 93 | Temp 97.7°F | Ht 61.0 in | Wt 265.2 lb

## 2021-02-17 DIAGNOSIS — L03116 Cellulitis of left lower limb: Secondary | ICD-10-CM | POA: Diagnosis not present

## 2021-02-17 DIAGNOSIS — I872 Venous insufficiency (chronic) (peripheral): Secondary | ICD-10-CM | POA: Diagnosis not present

## 2021-02-17 DIAGNOSIS — M654 Radial styloid tenosynovitis [de Quervain]: Secondary | ICD-10-CM

## 2021-02-17 DIAGNOSIS — R600 Localized edema: Secondary | ICD-10-CM

## 2021-02-17 DIAGNOSIS — I10 Essential (primary) hypertension: Secondary | ICD-10-CM

## 2021-02-17 MED ORDER — CLINDAMYCIN HCL 300 MG PO CAPS: 300.0000 mg | ORAL_CAPSULE | Freq: Three times a day (TID) | ORAL | 0 refills | Status: DC

## 2021-02-17 MED ORDER — FUROSEMIDE 20 MG PO TABS: 40.0000 mg | ORAL_TABLET | Freq: Every day | ORAL | 3 refills | Status: DC

## 2021-02-17 NOTE — Progress Notes (Signed)
OFFICE VISIT  02/17/2021  CC: f/u HTN, LE edema, thumb pain  HPI:    Patient is a 70 y.o. female who presents for 2 wk f/u HTN, LE edema, and thumb pain. A/P as of last visit: "1) bilateral lower extremity edema d/t chronic venous insufficiency: Worse x1 week, suspect dietary sodium indiscretion.  Also, says may have missed a dose or 2 of her furosemide.  Now has some stasis dermatitis plus or minus cellulitis of bilateral lower extremities. Increase Lasix to 20 mg twice daily for the next 3 days, then resume 20 mg daily. Treat with clindamycin 300 mg 3 times daily x7 days. Apply Cutivate cream 0.05% twice daily to anterior surfaces of both lower legs.  2) HTN: bp up some today but historically 130/80 avg at home. No recent home bp data for comparison, though. Will have her restart daily home bp and hr checks and review these at f/u in 2 wks. Her felodipine is likely contributing some to her edema BUT her edema has never been to this degree while on this med in the past. Consider addition/switch to Beta 1 selective BB (bisoprolol or atenolol) if needed. Lytes/cr today.   3) HLD: tolerating pravastatin 40 qd long term. LDL 99 one year ago. Goal LDL <70. FLP and hepatic panel today.   4) Hypothyroidism; 125 mcg T4 qd.  TSH has been stable last few years. TSH today. She is not on iron supp or b12 supp: takes 1 women's MVI.   5) Chronic pain syndrome: osteoarthritis in hands, low back, feet, L hip, knees, wrists+chemotherapy -induced neuropathy (burning and numbness in feet). Chronic R wrist and thumb pain:  She has known osteoarthritis in these areas that she says ortho surgery recommended surgery/fusion.  Has only gotten a couple days of improvement in the past with [facility] steroid injection.  She has evidence of DeQuervain's tenosynovitis as well (bedside u/s with fluid in tendon sheath in transverse plane.   She just started thumb spica splint a couple days ago and says this usually  helps. If not signif improved at f/u in 2 wks then consider steroid inj into tendon sheath.   CSC needs updating. UDS today."  INTERIM HX: Last visit all of her labs were excellent. She is feeling pretty well. Home bp monitoring: 130s-140s over 60s.  No home HR data.  EDEMA: She did take Lasix 20 mg twice a day for 3 days as well as clindamycin after last visit.  She also has been applying Cutivate cream to lower legs.  Her right leg redness resolved and swelling improved some.  He then went on a car ride to [place] and was able to elevate her right leg but not her left.  Subsequently, her left leg swelling got a little worse and just 2 days ago she started getting redness anteriorly.  It itches but does not hurt.  No fever, chills, or malaise.  Continues to have chronic pain in both thumbs.  Has good days and bad days and says today is a good day.  The pain is diffuse over the proximal aspect of the thumbs.  She wears wrist splints and these do help some.  Past Medical History:  Diagnosis Date   Allergic rhinitis    Allergy testing: results pending as of 05/28/16 (Dr. Harold Hedge)   Anemia 10/2020   anemia of chronic dz (? + mild IDA?--iron started   Arthritis    Chemotherapy-induced neuropathy ([place]) 2019   Cough variant asthma  DDD (degenerative disc disease), lumbar    Diabetes mellitus with complication ([place])    Mild nonprolif DR R eye 12/2017-->referred to retinal specialist DM MANAGED BY DR. GHERGHE AS OF 2021   Endometrial cancer ([place]) 02/2018   REMISSION AS OF 08/2018.  Stage III (T3, Nx, Mx)  Path: endometroid adenocarcinoma involving cervix, uterus, and fallopian tubes (Pt got TAH & BSO 02/2018). No sign of recurrence as of onc f/u 05/2019.   GERD (gastroesophageal reflux disease)    Hemorrhoids    Herpes zoster 10/2015   L side belt-line   History of adenomatous polyp of colon 02/25/2013   5 mm cecal polyp removed by Dr. Trevor Mace 5 yrs   History of blood transfusion     History of cellulitis 08/2010   Left (Since replacemnt of Left knee   History of radiation therapy 05/21/2018   [facility] brachytherapy 04/24/2018-05/21/2018  Dr Gery Pray   Hyperkalemia 03/2018   started after pt started getting chemo-->had to stop enalapril.   Hyperlipidemia    Not on statin b/c lipids "stayed down when sugars came down" per pt.  She says Dr. Chalmers Cater knows she is not on statin anymore.   Hypertension    Hypothyroidism    Nephrolithiasis 07/2005   Obesity    OSA on CPAP    Osteoarthritis    knees, ankle, + ? scapholunate ligament disruption (x-ray 06/2017)--ortho referral.   Pancytopenia due to antineoplastic chemotherapy ([place])    progressive as of 06/2018. Stable 08/2018.   Recurrent UTI    started cipro 250 qd 09/2018   Sciatica of left side    Severe persistent asthma    cough-variant--saw Allergist 05/25/16 and was switched from max dose advair to symbicort.   Systolic murmur    ECHO fine 10/2016-->murmur likely flow murmur assoc with HTN.   Zoon's vulvitis    Bx-proven (GYN) -lichen sclerosis.  Clobetasol 0.05% ointment per GYN    Past Surgical History:  Procedure Laterality Date   ANKLE FRACTURE SURGERY  1984   Pin & repair   ARTHROSCOPIC REPAIR ACL  2000   Due to ACL tear   ARTHROSCOPIC REPAIR ACL  5/08   Blateral knee rerlacements x4 2 times each knee     BREAST BIOPSY Right 2014   Benign   CARDIAC CATHETERIZATION  5/07   clear vessel mild mitral    CARPAL TUNNEL RELEASE Right [id number]   1989 Left   CESAREAN SECTION  1985   CHOLECYSTECTOMY OPEN  1988   COLONOSCOPY N/A 02/25/2013   Tubular adenoma x 1: Recall 5 yrs. Procedure: COLONOSCOPY;  Surgeon: Juanita Craver, MD;  Location: [facility];  Service: Endoscopy;  Laterality: N/A;   COMBINED HYSTEROSCOPY DIAGNOSTIC / D&C  2/12   Bx neg   DEXA  08/2007; 05/12/20   Bone density normal 2009 and 2022.  Plan rpt 2024.   DILATION AND CURETTAGE OF UTERUS  12/11   IR IMAGING GUIDED PORT INSERTION  03/16/2018    IR REMOVAL TUN ACCESS W/ PORT W/O FL MOD SED  09/20/2018   LYMPH NODE BIOPSY N/A 02/19/2018   Procedure: Sentinel LYMPH NODE BIOPSY;  Surgeon: Everitt Amber, MD;  Location: [facility];  Service: Gynecology;  Laterality: N/A;   REPLACEMENT TOTAL KNEE Left 5/12   X 2 on each   ROBOTIC ASSISTED TOTAL HYSTERECTOMY WITH BILATERAL SALPINGO OOPHERECTOMY N/A 02/19/2018   For endometrial cancer.  Procedure: XI ROBOTIC ASSISTED TOTAL HYSTERECTOMY WITH BILATERAL SALPINGO OOPHORECTOMY;  Surgeon: Everitt Amber,  MD;  Location: [place];  Service: Gynecology;  Laterality: N/A;   TRANSTHORACIC ECHOCARDIOGRAM  10/11/2016    EF 55-60%, grd I DD.   TUBAL LIGATION  1985   C-Section    Outpatient Medications Prior to Visit  Medication Sig Dispense Refill   albuterol (PROVENTIL) (2.5 MG/3ML) 0.083% nebulizer solution Take 3 mLs (2.5 mg total) by nebulization every 4 (four) hours as needed for wheezing or shortness of breath (Dx: J45.50). 75 mL 1   albuterol (VENTOLIN HFA) 108 (90 Base) MCG/ACT inhaler INHALE 2 PUFFS INTO LUNGS EVERY 6 HOURS AS NEEDED FOR WHEEZING/SHORTNESS OF BREATH 18 each 1   amitriptyline (ELAVIL) 25 MG tablet 2 tabs po qhs 180 tablet 3   BESIVANCE 0.6 % SUSP Place 1 drop into the right eye 3 (three) times daily.     clobetasol ointment (TEMOVATE) 5.09 % Apply 1 application topically at bedtime. Apply to the skin of the vulva for 12 weeks 30 g 2   Continuous Blood Gluc Receiver (FREESTYLE LIBRE 14 DAY READER) DEVI by Does not apply route.     diclofenac Sodium (VOLTAREN) 1 % GEL Apply 2 g topically 4 (four) times daily. 100 g 3   diphenoxylate-atropine (LOMOTIL) 2.5-0.025 MG tablet 1-2 tabs po qid prn diarrhea 30 tablet 2   felodipine (PLENDIL) 10 MG 24 hr tablet TAKE 1 TABLET BY MOUTH EVERY DAY 90 tablet 0   fluticasone (CUTIVATE) 0.05 % cream APPLY TO AFFECTED AREA OF R LOWER LEG TWICE PER DAY. 60 g 3   fluticasone (VERAMYST) 27.5 MCG/SPRAY nasal spray Place 1  spray into the nose daily as needed for rhinitis.     HYDROcodone-acetaminophen (NORCO/VICODIN) 5-325 MG tablet 1-2 tabs po bid prn pain 90 tablet 0   insulin NPH Human (NOVOLIN N) 100 UNIT/ML injection INJECT 30 UNITS UNDER THE SKIN 3 TIMES A DAY 30 mL 6   Insulin Syringes, Disposable, U-100 0.3 ML MISC Use to inject insulin--6 injections per day 540 each 3   levocetirizine (XYZAL) 5 MG tablet Take 5 mg by mouth at bedtime as needed for allergies.   5   levothyroxine (SYNTHROID) 125 MCG tablet TAKE 1 TABLET BY MOUTH EVERY DAY 90 tablet 0   metFORMIN (GLUCOPHAGE) 500 MG tablet TAKE 1 TABLET BY MOUTH EVERY MORNING AND 2 TABLETS BY MOUTH AT BEDTIME. 270 tablet 3   miconazole (MICOTIN) 2 % powder Apply 1 application topically 2 (two) times daily as needed for itching.     NOVOLOG 100 UNIT/ML injection INJECT 20-25 UNITS INTO THE SKIN 3 TIMES A DAY BEFORE MEALS 90 mL 1   pravastatin (PRAVACHOL) 40 MG tablet TAKE 1 TABLET BY MOUTH EVERY DAY 90 tablet 0   prednisoLONE acetate (PRED FORTE) 1 % ophthalmic suspension Place 1 drop into the right eye 4 (four) times daily.     Probiotic Product (PROBIOTIC PO) Take 1 capsule by mouth daily.     PROLENSA 0.07 % SOLN SMARTSIG:1 Drop(s) Right Eye Every Evening     SYMBICORT 160-4.5 MCG/ACT inhaler INHALE 2 PUFFS BY MOUTH TWICE A DAY 30.6 each 3   furosemide (LASIX) 20 MG tablet TAKE 1 TABLET BY MOUTH EVERY DAY 90 tablet 1   No facility-administered medications prior to visit.    Allergies  Allergen Reactions   Adhesive [Tape] Other (See Comments)    Takes patient's skin off.   Banana Diarrhea and Nausea Only   Eggs Or Egg-Derived Products Diarrhea and Nausea Only   Latex Other (See  Comments)    sensativity   Linzess [Linaclotide] Diarrhea   Ozempic (0.25 Or 0.5 Mg-Dose) [Semaglutide(0.25 Or 0.5mg -Dos)] Diarrhea   Penicillins Rash and Other (See Comments)    Has had cephalosporins without incident   Pine Swelling and Rash   Rose Swelling and Rash     ROS As per HPI  PE: Vitals with BMI 02/17/2021 02/02/2021 12/17/2020  Height 5\' 1"  5\' 1"  5\' 1"   Weight 265 lbs 3 oz 261 lbs 257 lbs  BMI 50.14 38.93 73.42  Systolic 876 811 572  Diastolic 82 71 60  Pulse 93 88 87     General: Alert, well-appearing. Lower legs with 4+ pitting bilaterally up to the knees.  No vesicles or weeping.  Right lower leg is without any erythema now.  Left pretibial region with a large splotch of erythema without well-defined borders.  There are some excoriations over this.  Nontender to touch.  Both lower legs feel warm, left greater than right. Thumbs with minimal tenderness to palpation over extensor/abductor tendons.  Positive Finkelstein's testing right greater than left.  LABS:  Lab Results  Component Value Date   TSH 0.76 02/02/2021   Lab Results  Component Value Date   WBC 8.9 02/02/2021   HGB 11.5 (L) 02/02/2021   HCT 34.9 (L) 02/02/2021   MCV 94.4 02/02/2021   PLT 224.0 02/02/2021   Lab Results  Component Value Date   IRON 49 02/02/2021   TIBC 364 02/02/2021   FERRITIN 31 02/02/2021   Lab Results  Component Value Date   IOMBTDHR41 638 10/28/2020   Lab Results  Component Value Date   CREATININE 0.84 02/02/2021   BUN 29 (H) 02/02/2021   NA 140 02/02/2021   K 4.0 02/02/2021   CL 100 02/02/2021   CO2 28 02/02/2021   Lab Results  Component Value Date   ALT 16 10/28/2020   AST 15 10/28/2020   ALKPHOS 70 10/28/2020   BILITOT 0.2 10/28/2020   Lab Results  Component Value Date   CHOL 109 02/02/2021   Lab Results  Component Value Date   HDL 37.80 (L) 02/02/2021   Lab Results  Component Value Date   LDLCALC 41 02/02/2021   Lab Results  Component Value Date   TRIG 150.0 (H) 02/02/2021   Lab Results  Component Value Date   CHOLHDL 3 02/02/2021   Lab Results  Component Value Date   HGBA1C 6.3 (A) 09/01/2020   IMPRESSION AND PLAN:  #1 bilateral lower extremity edema due to venous insufficiency. We will go ahead  and increase her daily Lasix dosing to 40 mg.  We will recheck electrolytes and creatinine at follow-up in 10 to 14 days.  Left pretibial region likely stasis dermatitis but will do clindamycin 300 3 times daily x7 days.  She will continue Cutivate cream over the area.  She will restart compression hose and continue attempts to limit sodium.  2.  Hypertension: Home measurements significantly better than in office measurements.  In fact, her diastolics are on the soft side.  We elected not to push the issue with additional medication today.  Continue felodipine 10 mg a day.  3.:  Chronic bilateral thumb pain.  Majority of this is due to [facility] osteoarthritis.  Joint injections have not helped in the past.  I do think she has a component of de Quervain's tenosynovitis.  After discussion today of potential risks and benefits patient wanted to proceed with trial of tendon sheath steroid injection on the right.  Procedure: Right de Quervain's tenosynovitis injection.  Consent obtained.  Utilize ultrasound for needle guidance to inject 20 mg Kenalog and 1/2 cc of 1% plain lidocaine into the extensor pollicis brevis and abductor pollicis longus tendon sheath.  Patient tolerated procedure well and there were no immediate complications.  An After Visit Summary was printed and given to the patient.  FOLLOW UP: Return for 10-14 days. (See photos of LL's for comparison)  Signed:  Crissie Sickles, MD           02/17/2021

## 2021-02-18 ENCOUNTER — Telehealth: Payer: Self-pay

## 2021-02-18 NOTE — Telephone Encounter (Signed)
Refill request for Clobetasol 0.05% ointment authorized and successfully faxed to [facility] pharmacy.

## 2021-02-23 DIAGNOSIS — E1136 Type 2 diabetes mellitus with diabetic cataract: Secondary | ICD-10-CM | POA: Diagnosis not present

## 2021-02-23 DIAGNOSIS — Z880 Allergy status to penicillin: Secondary | ICD-10-CM | POA: Diagnosis not present

## 2021-02-23 DIAGNOSIS — G4730 Sleep apnea, unspecified: Secondary | ICD-10-CM | POA: Diagnosis not present

## 2021-02-23 DIAGNOSIS — Z91040 Latex allergy status: Secondary | ICD-10-CM | POA: Diagnosis not present

## 2021-02-23 DIAGNOSIS — Z6841 Body Mass Index (BMI) 40.0 and over, adult: Secondary | ICD-10-CM | POA: Diagnosis not present

## 2021-02-23 DIAGNOSIS — J45909 Unspecified asthma, uncomplicated: Secondary | ICD-10-CM | POA: Diagnosis not present

## 2021-02-23 DIAGNOSIS — Z91048 Other nonmedicinal substance allergy status: Secondary | ICD-10-CM | POA: Diagnosis not present

## 2021-02-23 DIAGNOSIS — Z923 Personal history of irradiation: Secondary | ICD-10-CM | POA: Diagnosis not present

## 2021-02-23 DIAGNOSIS — E039 Hypothyroidism, unspecified: Secondary | ICD-10-CM | POA: Diagnosis not present

## 2021-02-23 DIAGNOSIS — I10 Essential (primary) hypertension: Secondary | ICD-10-CM | POA: Diagnosis not present

## 2021-02-23 DIAGNOSIS — Z794 Long term (current) use of insulin: Secondary | ICD-10-CM | POA: Diagnosis not present

## 2021-02-23 DIAGNOSIS — Z888 Allergy status to other drugs, medicaments and biological substances status: Secondary | ICD-10-CM | POA: Diagnosis not present

## 2021-02-23 DIAGNOSIS — Z7951 Long term (current) use of inhaled steroids: Secondary | ICD-10-CM | POA: Diagnosis not present

## 2021-02-23 DIAGNOSIS — Z9221 Personal history of antineoplastic chemotherapy: Secondary | ICD-10-CM | POA: Diagnosis not present

## 2021-02-23 DIAGNOSIS — G4733 Obstructive sleep apnea (adult) (pediatric): Secondary | ICD-10-CM | POA: Diagnosis not present

## 2021-02-23 DIAGNOSIS — H2512 Age-related nuclear cataract, left eye: Secondary | ICD-10-CM | POA: Diagnosis not present

## 2021-02-23 DIAGNOSIS — Z79899 Other long term (current) drug therapy: Secondary | ICD-10-CM | POA: Diagnosis not present

## 2021-02-23 DIAGNOSIS — Z8542 Personal history of malignant neoplasm of other parts of uterus: Secondary | ICD-10-CM | POA: Diagnosis not present

## 2021-02-23 DIAGNOSIS — E7800 Pure hypercholesterolemia, unspecified: Secondary | ICD-10-CM | POA: Diagnosis not present

## 2021-02-23 DIAGNOSIS — Z7984 Long term (current) use of oral hypoglycemic drugs: Secondary | ICD-10-CM | POA: Diagnosis not present

## 2021-02-23 DIAGNOSIS — M1612 Unilateral primary osteoarthritis, left hip: Secondary | ICD-10-CM | POA: Diagnosis not present

## 2021-02-23 DIAGNOSIS — Z96653 Presence of artificial knee joint, bilateral: Secondary | ICD-10-CM | POA: Diagnosis not present

## 2021-02-23 DIAGNOSIS — Z91018 Allergy to other foods: Secondary | ICD-10-CM | POA: Diagnosis not present

## 2021-02-23 DIAGNOSIS — Z91012 Allergy to eggs: Secondary | ICD-10-CM | POA: Diagnosis not present

## 2021-02-23 DIAGNOSIS — Z79890 Hormone replacement therapy (postmenopausal): Secondary | ICD-10-CM | POA: Diagnosis not present

## 2021-02-23 MED ORDER — TRIAMCINOLONE ACETONIDE 40 MG/ML IJ SUSP
20.0000 mg | Freq: Once | INTRAMUSCULAR | Status: AC
  Administered 2021-02-17: 20 mg via INTRAMUSCULAR

## 2021-02-23 NOTE — Addendum Note (Signed)
Addended by: Octaviano Glow on: 02/23/2021 01:12 PM   Modules accepted: Orders

## 2021-02-26 ENCOUNTER — Ambulatory Visit: Payer: Medicare Other | Admitting: Internal Medicine

## 2021-03-01 ENCOUNTER — Encounter: Payer: Self-pay | Admitting: Family Medicine

## 2021-03-01 ENCOUNTER — Telehealth: Payer: Self-pay

## 2021-03-01 ENCOUNTER — Ambulatory Visit (INDEPENDENT_AMBULATORY_CARE_PROVIDER_SITE_OTHER): Payer: Medicare Other | Admitting: Family Medicine

## 2021-03-01 ENCOUNTER — Other Ambulatory Visit: Payer: Self-pay

## 2021-03-01 VITALS — BP 160/70 | HR 96 | Temp 98.0°F | Ht 61.0 in | Wt 261.4 lb

## 2021-03-01 DIAGNOSIS — R600 Localized edema: Secondary | ICD-10-CM | POA: Diagnosis not present

## 2021-03-01 DIAGNOSIS — I10 Essential (primary) hypertension: Secondary | ICD-10-CM | POA: Diagnosis not present

## 2021-03-01 MED ORDER — LISINOPRIL 10 MG PO TABS: 10.0000 mg | ORAL_TABLET | Freq: Every day | ORAL | 1 refills | Status: DC

## 2021-03-01 NOTE — Progress Notes (Signed)
See student note for this encounter. I have attested it. Signed:  Crissie Sickles, MD           03/01/2021

## 2021-03-01 NOTE — Telephone Encounter (Signed)
Inbound fax requesting recent clinical notes. Clinical notes routed via Epic to [facility] at [phone].

## 2021-03-01 NOTE — Progress Notes (Addendum)
OFFICE VISIT  03/01/2021  CC:  Chief Complaint  Patient presents with   Follow-up    Hypertension, rash     HPI:    Patient is a 70 y.o. female who presents for 12 day f/u edema and rash of legs and HTN.  1. bilateral lower extremity edema due to venous insufficiency. Patient reports decreased swelling, redness, and rash (stasis dermatitis) in her legs. She attributes this to a course of clindamycin 300 tid and Cutivate cream. Patient says she will do better wearing her compression socks and limiting sodium intake.  2.  Hypertension: Patients home measurements range between 448-185 systolic and 60 diastolic. She has had several BP over the last few weeks that have hit the 160s. She denies HTN symptoms (headaches, vision changes) and denies side effects from her medication (Felodipine 10 mg daily).  3.:  Chronic bilateral thumb pain. Patient reports 2/10 joint pain bilaterally and normal ROM. Last visit she received a 20 mg Kenalog and 1/2 cc of 1% plain lidocaine injection into her extensor pollicis brevis and abductor pollicis longus tendon sheath in her right wrist.   Past Medical History:  Diagnosis Date   Allergic rhinitis    Allergy testing: results pending as of 05/28/16 (Dr. Harold Hedge)   Anemia 10/2020   anemia of chronic dz (? + mild IDA?--iron started   Arthritis    Chemotherapy-induced neuropathy ([place]) 2019   Cough variant asthma    DDD (degenerative disc disease), lumbar    Diabetes mellitus with complication ([place])    Mild nonprolif DR R eye 12/2017-->referred to retinal specialist DM MANAGED BY DR. GHERGHE AS OF 2021   Endometrial cancer ([place]) 02/2018   REMISSION AS OF 08/2018.  Stage III (T3, Nx, Mx)  Path: endometroid adenocarcinoma involving cervix, uterus, and fallopian tubes (Pt got TAH & BSO 02/2018). No sign of recurrence as of onc f/u 05/2019.   GERD (gastroesophageal reflux disease)    Hemorrhoids    Herpes zoster 10/2015   L side belt-line   History of  adenomatous polyp of colon 02/25/2013   5 mm cecal polyp removed by Dr. Trevor Mace 5 yrs   History of blood transfusion    History of cellulitis 08/2010   Left (Since replacemnt of Left knee   History of radiation therapy 05/21/2018   [facility] brachytherapy 04/24/2018-05/21/2018  Dr Gery Pray   Hyperkalemia 03/2018   started after pt started getting chemo-->had to stop enalapril.   Hyperlipidemia    Not on statin b/c lipids "stayed down when sugars came down" per pt.  She says Dr. Chalmers Cater knows she is not on statin anymore.   Hypertension    Hypothyroidism    Nephrolithiasis 07/2005   Obesity    OSA on CPAP    Osteoarthritis    knees, ankle, + ? scapholunate ligament disruption (x-ray 06/2017)--ortho referral.   Pancytopenia due to antineoplastic chemotherapy ([place])    progressive as of 06/2018. Stable 08/2018.   Recurrent UTI    started cipro 250 qd 09/2018   Sciatica of left side    Severe persistent asthma    cough-variant--saw Allergist 05/25/16 and was switched from max dose advair to symbicort.   Systolic murmur    ECHO fine 10/2016-->murmur likely flow murmur assoc with HTN.   Zoon's vulvitis    Bx-proven (GYN) -lichen sclerosis.  Clobetasol 0.05% ointment per GYN    Past Surgical History:  Procedure Laterality Date   [place]  repair   ARTHROSCOPIC REPAIR ACL  2000   Due to ACL tear   ARTHROSCOPIC REPAIR ACL  5/08   Blateral knee rerlacements x4 2 times each knee     BREAST BIOPSY Right 2014   Benign   CARDIAC CATHETERIZATION  5/07   clear vessel mild mitral    CARPAL TUNNEL RELEASE Right [id number]   1989 Left   CESAREAN SECTION  1985   CHOLECYSTECTOMY OPEN  1988   COLONOSCOPY N/A 02/25/2013   Tubular adenoma x 1: Recall 5 yrs. Procedure: COLONOSCOPY;  Surgeon: Juanita Craver, MD;  Location: [facility];  Service: Endoscopy;  Laterality: N/A;   COMBINED HYSTEROSCOPY DIAGNOSTIC / D&C  2/12   Bx neg   DEXA  08/2007; 05/12/20   Bone density  normal 2009 and 2022.  Plan rpt 2024.   DILATION AND CURETTAGE OF UTERUS  12/11   IR IMAGING GUIDED PORT INSERTION  03/16/2018   IR REMOVAL TUN ACCESS W/ PORT W/O FL MOD SED  09/20/2018   LYMPH NODE BIOPSY N/A 02/19/2018   Procedure: [place] LYMPH NODE BIOPSY;  Surgeon: Everitt Amber, MD;  Location[facility];  Service: Gynecology;  Laterality: N/A;   REPLACEMENT TOTAL KNEE Left 5/12   X 2 on each   ROBOTIC ASSISTED TOTAL HYSTERECTOMY WITH BILATERAL SALPINGO OOPHERECTOMY N/A 02/19/2018   For endometrial cancer.  Procedure: XI ROBOTIC ASSISTED TOTAL HYSTERECTOMY WITH BILATERAL SALPINGO OOPHORECTOMY;  Surgeon: Everitt Amber, MD;  Location: [place];  Service: Gynecology;  Laterality: N/A;   TRANSTHORACIC ECHOCARDIOGRAM  10/11/2016    EF 55-60%, grd I DD.   TUBAL LIGATION  1985   C-Section    Outpatient Medications Prior to Visit  Medication Sig Dispense Refill   albuterol (PROVENTIL) (2.5 MG/3ML) 0.083% nebulizer solution Take 3 mLs (2.5 mg total) by nebulization every 4 (four) hours as needed for wheezing or shortness of breath (Dx: J45.50). 75 mL 1   albuterol (VENTOLIN HFA) 108 (90 Base) MCG/ACT inhaler INHALE 2 PUFFS INTO LUNGS EVERY 6 HOURS AS NEEDED FOR WHEEZING/SHORTNESS OF BREATH 18 each 1   amitriptyline (ELAVIL) 25 MG tablet 2 tabs po qhs 180 tablet 3   BESIVANCE 0.6 % SUSP Place 1 drop into the right eye 3 (three) times daily.     clobetasol ointment (TEMOVATE) 6.72 % Apply 1 application topically at bedtime. Apply to the skin of the vulva for 12 weeks 30 g 2   Continuous Blood Gluc Receiver (FREESTYLE LIBRE 14 DAY READER) DEVI by Does not apply route.     diclofenac Sodium (VOLTAREN) 1 % GEL Apply 2 g topically 4 (four) times daily. 100 g 3   diphenoxylate-atropine (LOMOTIL) 2.5-0.025 MG tablet 1-2 tabs po qid prn diarrhea 30 tablet 2   felodipine (PLENDIL) 10 MG 24 hr tablet TAKE 1 TABLET BY MOUTH EVERY DAY 90 tablet 0   fluticasone (CUTIVATE) 0.05  % cream APPLY TO AFFECTED AREA OF R LOWER LEG TWICE PER DAY. 60 g 3   fluticasone (VERAMYST) 27.5 MCG/SPRAY nasal spray Place 1 spray into the nose daily as needed for rhinitis.     furosemide (LASIX) 20 MG tablet Take 2 tablets (40 mg total) by mouth daily. 180 tablet 3   HYDROcodone-acetaminophen (NORCO/VICODIN) 5-325 MG tablet 1-2 tabs po bid prn pain 90 tablet 0   insulin NPH Human (NOVOLIN N) 100 UNIT/ML injection INJECT 30 UNITS UNDER THE SKIN 3 TIMES A DAY 30 mL 6   Insulin Syringes, Disposable, U-100 0.3 ML  MISC Use to inject insulin--6 injections per day 540 each 3   levocetirizine (XYZAL) 5 MG tablet Take 5 mg by mouth at bedtime as needed for allergies.   5   levothyroxine (SYNTHROID) 125 MCG tablet TAKE 1 TABLET BY MOUTH EVERY DAY 90 tablet 0   metFORMIN (GLUCOPHAGE) 500 MG tablet TAKE 1 TABLET BY MOUTH EVERY MORNING AND 2 TABLETS BY MOUTH AT BEDTIME. 270 tablet 3   miconazole (MICOTIN) 2 % powder Apply 1 application topically 2 (two) times daily as needed for itching.     NOVOLOG 100 UNIT/ML injection INJECT 20-25 UNITS INTO THE SKIN 3 TIMES A DAY BEFORE MEALS 90 mL 1   pravastatin (PRAVACHOL) 40 MG tablet TAKE 1 TABLET BY MOUTH EVERY DAY 90 tablet 0   prednisoLONE acetate (PRED FORTE) 1 % ophthalmic suspension Place 1 drop into the right eye 4 (four) times daily.     Probiotic Product (PROBIOTIC PO) Take 1 capsule by mouth daily.     PROLENSA 0.07 % SOLN SMARTSIG:1 Drop(s) Right Eye Every Evening     SYMBICORT 160-4.5 MCG/ACT inhaler INHALE 2 PUFFS BY MOUTH TWICE A DAY 30.6 each 3   clindamycin (CLEOCIN) 300 MG capsule Take 1 capsule (300 mg total) by mouth 3 (three) times daily. 21 capsule 0   No facility-administered medications prior to visit.    Allergies  Allergen Reactions   Adhesive [Tape] Other (See Comments)    Takes patient's skin off.   Banana Diarrhea and Nausea Only   Eggs Or Egg-Derived Products Diarrhea and Nausea Only   Latex Other (See Comments)     sensativity   Linzess [Linaclotide] Diarrhea   Ozempic (0.25 Or 0.5 Mg-Dose) [Semaglutide(0.25 Or 0.5mg -Dos)] Diarrhea   Penicillins Rash and Other (See Comments)    Has had cephalosporins without incident   Pine Swelling and Rash   Rose Swelling and Rash    ROS As per HPI  PE: Vitals with BMI 03/01/2021 02/17/2021 02/02/2021  Height 5\' 1"  5\' 1"  5\' 1"   Weight 261 lbs 6 oz 265 lbs 3 oz 261 lbs  BMI 49.42 82.42 35.36  Systolic 144 315 400  Diastolic 70 82 71  Pulse 96 93 88   Physical Exam Constitutional:      Appearance: Normal appearance.  Cardiovascular:     Rate and Rhythm: Normal rate and regular rhythm.     Heart sounds: Normal heart sounds.  Pulmonary:     Effort: Pulmonary effort is normal.     Breath sounds: Normal breath sounds.  Skin:    General: Skin is dry.     Comments: Desquamating skin and pinkish hue located on the pretibial surface bilaterally  Neurological:     Mental Status: She is alert.   LABS:  Lab Results  Component Value Date   TSH 0.76 02/02/2021   Lab Results  Component Value Date   WBC 8.9 02/02/2021   HGB 11.5 (L) 02/02/2021   HCT 34.9 (L) 02/02/2021   MCV 94.4 02/02/2021   PLT 224.0 02/02/2021   Lab Results  Component Value Date   CREATININE 0.84 02/02/2021   BUN 29 (H) 02/02/2021   NA 140 02/02/2021   K 4.0 02/02/2021   CL 100 02/02/2021   CO2 28 02/02/2021   Lab Results  Component Value Date   ALT 16 10/28/2020   AST 15 10/28/2020   ALKPHOS 70 10/28/2020   BILITOT 0.2 10/28/2020   Lab Results  Component Value Date   CHOL 109 02/02/2021  Lab Results  Component Value Date   HDL 37.80 (L) 02/02/2021   Lab Results  Component Value Date   LDLCALC 41 02/02/2021   Lab Results  Component Value Date   TRIG 150.0 (H) 02/02/2021   Lab Results  Component Value Date   CHOLHDL 3 02/02/2021   Lab Results  Component Value Date   HGBA1C 6.3 (A) 09/01/2020   Procedure: Right de Quervain's tenosynovitis injection.   Consent obtained.  Utilize ultrasound for needle guidance to inject 20 mg Kenalog and 1/2 cc of 1% plain lidocaine into the extensor pollicis brevis and abductor pollicis longus tendon sheath.  Patient tolerated procedure well and there were no immediate complications.  IMPRESSION AND PLAN: Non-toxic appearing female who presents for f/u for chronic venous insufficiency, htn, and thumb pain.  1 bilateral lower extremity edema due to venous insufficiency:  Improving. Continue Lasix at 40 mg. She will restart compression hose and continue attempts to limit sodium.  2. Hypertension: Elevated today at 160/70. Home measurements significantly better than in office measurements with systolics ranging from 379-432.  Of note, her diastolics are on the soft side.  Continue felodipine 10 mg a day. Added Lisinopril 10 mg to also help with BP control as well.  3: Chronic bilateral thumb pain: Resolved, pain is only 2/10. Will reassess need for injections at next visit.  Labs: BMP  An After Visit Summary was printed and given to the patient.  FOLLOW UP: No follow-ups on file.   Phil Dopp -MS3  Signed:  Crissie Sickles, MD           03/01/2021

## 2021-03-02 LAB — BASIC METABOLIC PANEL
BUN: 27 mg/dL — ABNORMAL HIGH (ref 6–23)
CO2: 31 mEq/L (ref 19–32)
Calcium: 9.4 mg/dL (ref 8.4–10.5)
Chloride: 98 mEq/L (ref 96–112)
Creatinine, Ser: 0.87 mg/dL (ref 0.40–1.20)
GFR: 67.62 mL/min (ref >60.00)
Glucose, Bld: 55 mg/dL — ABNORMAL LOW (ref 70–99)
Potassium: 3.6 mEq/L (ref 3.5–5.1)
Sodium: 139 mEq/L (ref 135–145)

## 2021-03-08 ENCOUNTER — Encounter: Payer: Self-pay | Admitting: Internal Medicine

## 2021-03-08 ENCOUNTER — Ambulatory Visit (INDEPENDENT_AMBULATORY_CARE_PROVIDER_SITE_OTHER): Payer: Medicare Other | Admitting: Internal Medicine

## 2021-03-08 ENCOUNTER — Other Ambulatory Visit: Payer: Self-pay

## 2021-03-08 VITALS — BP 148/60 | HR 81 | Ht 61.0 in | Wt 262.0 lb

## 2021-03-08 DIAGNOSIS — E785 Hyperlipidemia, unspecified: Secondary | ICD-10-CM

## 2021-03-08 DIAGNOSIS — E1142 Type 2 diabetes mellitus with diabetic polyneuropathy: Secondary | ICD-10-CM | POA: Diagnosis not present

## 2021-03-08 DIAGNOSIS — E1165 Type 2 diabetes mellitus with hyperglycemia: Secondary | ICD-10-CM

## 2021-03-08 LAB — POCT GLYCOSYLATED HEMOGLOBIN (HGB A1C): Hemoglobin A1C: 6.6 % — AB (ref 4.0–5.6)

## 2021-03-08 NOTE — Progress Notes (Signed)
ID: [name], female   DOB: 1950-06-11, 70 y.o.   MRN: [id number]   This visit occurred during the SARS-CoV-2 public health emergency.  Safety protocols were in place, including screening questions prior to the visit, additional usage of staff PPE, and extensive cleaning of exam room while observing appropriate contact time as indicated for disinfecting solutions.   HPI: [name] is a 70 y.o.-year-old female, initially referred by her PCP, Dr. Ernestine Conrad, returning for follow-up for DM2, dx in 1992, insulin-dependent since 2004-2005, uncontrolled, with complications (DR).  Last visit 6 months ago.  She is here with her husband who offers part of the history especially related to her past medical history, medications, and activity. On [facility].  Interim history: No increased urination, blurry vision, nausea, chest pain. Her buttocks ulcer healed since last OV - seeing wound care. She had cellulitis in both legs in 01/2021 >> had ABx.  Now resolved. She had a low blood sugar of 55 on BMP 03/01/2021.  Reviewed HbA1c levels: Lab Results  Component Value Date   HGBA1C 6.3 (A) 09/01/2020   HGBA1C 9.1 (A) 05/15/2020   HGBA1C 7.7 (A) 12/26/2019   HGBA1C 6.6 (A) 05/07/2019   HGBA1C 8.8 (H) 01/21/2019   HGBA1C 7.5 (H) 10/05/2018   HGBA1C 7.0 (A) 05/18/2018   HGBA1C 7.1 (H) 02/15/2018   HGBA1C 7.3 (A) 01/03/2018   HGBA1C 9.4 (H) 10/04/2017   She is on: - Metformin 500 mg in am and 1000 mg with dinner  Insulin Before breakfast Before lunch Before dinner  Novolog 28 15 25   NPH 25 30 35   She tried Ozempic 0.5 mg weekly >> significant diarrhea, abd. Pain, food intolerance >> started 04/2019 -stopped 07/2019  She is checking more than 4 times a day with her CGM-started before last visit.  She has to use skin tac to be able to tolerate the adhesive:   Previously:   Lowest sugar was 65-70 (more active) >> 50s >> 54; she has hypoglycemia awareness in the 70s.  Highest sugar was 300  - pizza >> 200 >> 260s >> 273  Glucometer: [facility]  Pt's meals are: - Breakfast: oatmeal - instant, peacans, butter >> sausage, bacon, apple sauce, velveeta crackers - Lunch: tortilla + PB and J or [place] and cheese >> yoghurt, apple sauce, half a sandwich - Dinner: meat (hamburger, chicken, bratwurst), spaghetti, green beans, mixed veggies - Snacks:   -No CKD, last BUN/creatinine:  Lab Results  Component Value Date   BUN 27 (H) 03/01/2021   BUN 29 (H) 02/02/2021   CREATININE 0.87 03/01/2021   CREATININE 0.84 02/02/2021   -+ HL; last set of lipids: Lab Results  Component Value Date   CHOL 109 02/02/2021   HDL 37.80 (L) 02/02/2021   LDLCALC 41 02/02/2021   LDLDIRECT 78.0 10/05/2018   TRIG 150.0 (H) 02/02/2021   CHOLHDL 3 02/02/2021  On pravastatin 20.  - last eye exam was on January 05, 2021: No DR  (previously:+ DR).  She had cataract surgery 02/23/2021.  -+ numbness and tingling in her feet -chemotherapy related.  Pt has FH of DM in daughter, brother, father, P aunts.  She also has a history of endometrial cancer - 2019, also, OSA, chemotherapy-induced peripheral neuropathy, hypothyroidism-on levothyroxine 125 mcg daily.  Latest TSH was normal: Lab Results  Component Value Date   TSH 0.76 02/02/2021   She lost a child at 38 y/o in 30 >> she gained 100 lbs and was dx'ed with DM  afterwards.  No h/o pancreatitis or FH of MTC.  Brother died of Lewy body dementia.  ROS: + see HPI Neurological: no tremors/+ numbness/+ tingling/no dizziness  I reviewed pt's medications, allergies, PMH, social hx, family hx, and changes were documented in the history of present illness. Otherwise, unchanged from my initial visit note.  Past Medical History:  Diagnosis Date   Allergic rhinitis    Allergy testing: results pending as of 05/28/16 (Dr. Harold Hedge)   Anemia 10/2020   anemia of chronic dz (? + mild IDA?--iron started   Arthritis    Chemotherapy-induced neuropathy ([place])  2019   Cough variant asthma    DDD (degenerative disc disease), lumbar    Diabetes mellitus with complication ([place])    Mild nonprolif DR R eye 12/2017-->referred to retinal specialist DM MANAGED BY DR. Maycee Blasco AS OF 2021   Endometrial cancer ([place]) 02/2018   REMISSION AS OF 08/2018.  Stage III (T3, Nx, Mx)  Path: endometroid adenocarcinoma involving cervix, uterus, and fallopian tubes (Pt got TAH & BSO 02/2018). No sign of recurrence as of onc f/u 05/2019.   GERD (gastroesophageal reflux disease)    Hemorrhoids    Herpes zoster 10/2015   L side belt-line   History of adenomatous polyp of colon 02/25/2013   5 mm cecal polyp removed by Dr. Trevor Mace 5 yrs   History of blood transfusion    History of cellulitis 08/2010   Left (Since replacemnt of Left knee   History of radiation therapy 05/21/2018   [facility] brachytherapy 04/24/2018-05/21/2018  Dr Gery Pray   Hyperkalemia 03/2018   started after pt started getting chemo-->had to stop enalapril.   Hyperlipidemia    Not on statin b/c lipids "stayed down when sugars came down" per pt.  She says Dr. Chalmers Cater knows she is not on statin anymore.   Hypertension    Hypothyroidism    Nephrolithiasis 07/2005   Obesity    OSA on CPAP    Osteoarthritis    knees, ankle, + ? scapholunate ligament disruption (x-ray 06/2017)--ortho referral.   Pancytopenia due to antineoplastic chemotherapy ([place])    progressive as of 06/2018. Stable 08/2018.   Recurrent UTI    started cipro 250 qd 09/2018   Sciatica of left side    Severe persistent asthma    cough-variant--saw Allergist 05/25/16 and was switched from max dose advair to symbicort.   Systolic murmur    ECHO fine 10/2016-->murmur likely flow murmur assoc with HTN.   Zoon's vulvitis    Bx-proven (GYN) -lichen sclerosis.  Clobetasol 0.05% ointment per GYN   Past Surgical History:  Procedure Laterality Date   ANKLE FRACTURE SURGERY  1984   Pin & repair   ARTHROSCOPIC REPAIR ACL  2000   Due to ACL tear    ARTHROSCOPIC REPAIR ACL  5/08   Blateral knee rerlacements x4 2 times each knee     BREAST BIOPSY Right 2014   Benign   CARDIAC CATHETERIZATION  5/07   clear vessel mild mitral    CARPAL TUNNEL RELEASE Right [id number]   1989 Left   CESAREAN SECTION  1985   CHOLECYSTECTOMY OPEN  1988   COLONOSCOPY N/A 02/25/2013   Tubular adenoma x 1: Recall 5 yrs. Procedure: COLONOSCOPY;  Surgeon: Juanita Craver, MD;  Location: [facility];  Service: Endoscopy;  Laterality: N/A;   COMBINED HYSTEROSCOPY DIAGNOSTIC / D&C  2/12   Bx neg   DEXA  08/2007; 05/12/20   Bone density normal 2009 and 2022.  Plan rpt 2024.   DILATION AND CURETTAGE OF UTERUS  12/11   IR IMAGING GUIDED PORT INSERTION  03/16/2018   IR REMOVAL TUN ACCESS W/ PORT W/O FL MOD SED  09/20/2018   LYMPH NODE BIOPSY N/A 02/19/2018   Procedure: Sentinel LYMPH NODE BIOPSY;  Surgeon: Everitt Amber, MD;  Location: [facility];  Service: Gynecology;  Laterality: N/A;   REPLACEMENT TOTAL KNEE Left 5/12   X 2 on each   ROBOTIC ASSISTED TOTAL HYSTERECTOMY WITH BILATERAL SALPINGO OOPHERECTOMY N/A 02/19/2018   For endometrial cancer.  Procedure: XI ROBOTIC ASSISTED TOTAL HYSTERECTOMY WITH BILATERAL SALPINGO OOPHORECTOMY;  Surgeon: Everitt Amber, MD;  Location: [place];  Service: Gynecology;  Laterality: N/A;   TRANSTHORACIC ECHOCARDIOGRAM  10/11/2016    EF 55-60%, grd I DD.   TUBAL LIGATION  1985   C-Section   Social History   Socioeconomic History   Marital status: Married    Spouse name: Louie Casa   Number of children: 2   Years of education: Not on file   Highest education level: Bachelor's degree (e.g., BA, AB, BS)  Occupational History   Occupation: retired Therapist, sports  Tobacco Use   Smoking status: Never   Smokeless tobacco: Never  Vaping Use   Vaping Use: Never used  Substance and Sexual Activity   Alcohol use: No    Alcohol/week: 0.0 standard drinks   Drug use: No   Sexual activity: Never    Partners: Male     Birth control/protection: I.U.D.  Other Topics Concern   Not on file  Social History Narrative   Not on file   Social Determinants of Health   Financial Resource Strain: Low Risk    Difficulty of Paying Living Expenses: Not hard at all  Food Insecurity: No Food Insecurity   Worried About Charity fundraiser in the Last Year: Never true   [place] in the Last Year: Never true  Transportation Needs: No Transportation Needs   Lack of Transportation (Medical): No   Lack of Transportation (Non-Medical): No  Physical Activity: Unknown   Days of Exercise per Week: Patient refused   Minutes of Exercise per Session: 20 min  Stress: No Stress Concern Present   Feeling of Stress : Not at all  Social Connections: Unknown   Frequency of Communication with Friends and Family: Three times a week   Frequency of Social Gatherings with Friends and Family: Once a week   Attends Religious Services: Patient refused   Marine scientist or Organizations: Patient refused   Attends Archivist Meetings: Never   Marital Status: Married  Human resources officer Violence: Not At Risk   Fear of Current or Ex-Partner: No   Emotionally Abused: No   Physically Abused: No   Sexually Abused: No   Current Outpatient Medications on File Prior to Visit  Medication Sig Dispense Refill   albuterol (PROVENTIL) (2.5 MG/3ML) 0.083% nebulizer solution Take 3 mLs (2.5 mg total) by nebulization every 4 (four) hours as needed for wheezing or shortness of breath (Dx: J45.50). 75 mL 1   albuterol (VENTOLIN HFA) 108 (90 Base) MCG/ACT inhaler INHALE 2 PUFFS INTO LUNGS EVERY 6 HOURS AS NEEDED FOR WHEEZING/SHORTNESS OF BREATH 18 each 1   amitriptyline (ELAVIL) 25 MG tablet 2 tabs po qhs 180 tablet 3   BESIVANCE 0.6 % SUSP Place 1 drop into the right eye 3 (three) times daily.     clobetasol ointment (TEMOVATE) 0.05 % Apply 1  application topically at bedtime. Apply to the skin of the vulva for 12 weeks 30 g 2    Continuous Blood Gluc Receiver (FREESTYLE LIBRE 14 DAY READER) DEVI by Does not apply route.     diclofenac Sodium (VOLTAREN) 1 % GEL Apply 2 g topically 4 (four) times daily. 100 g 3   diphenoxylate-atropine (LOMOTIL) 2.5-0.025 MG tablet 1-2 tabs po qid prn diarrhea 30 tablet 2   felodipine (PLENDIL) 10 MG 24 hr tablet TAKE 1 TABLET BY MOUTH EVERY DAY 90 tablet 0   fluticasone (CUTIVATE) 0.05 % cream APPLY TO AFFECTED AREA OF R LOWER LEG TWICE PER DAY. 60 g 3   fluticasone (VERAMYST) 27.5 MCG/SPRAY nasal spray Place 1 spray into the nose daily as needed for rhinitis.     furosemide (LASIX) 20 MG tablet Take 2 tablets (40 mg total) by mouth daily. 180 tablet 3   HYDROcodone-acetaminophen (NORCO/VICODIN) 5-325 MG tablet 1-2 tabs po bid prn pain 90 tablet 0   insulin NPH Human (NOVOLIN N) 100 UNIT/ML injection INJECT 30 UNITS UNDER THE SKIN 3 TIMES A DAY 30 mL 6   Insulin Syringes, Disposable, U-100 0.3 ML MISC Use to inject insulin--6 injections per day 540 each 3   levocetirizine (XYZAL) 5 MG tablet Take 5 mg by mouth at bedtime as needed for allergies.   5   levothyroxine (SYNTHROID) 125 MCG tablet TAKE 1 TABLET BY MOUTH EVERY DAY 90 tablet 0   lisinopril (ZESTRIL) 10 MG tablet Take 1 tablet (10 mg total) by mouth daily. 30 tablet 1   metFORMIN (GLUCOPHAGE) 500 MG tablet TAKE 1 TABLET BY MOUTH EVERY MORNING AND 2 TABLETS BY MOUTH AT BEDTIME. 270 tablet 3   miconazole (MICOTIN) 2 % powder Apply 1 application topically 2 (two) times daily as needed for itching.     NOVOLOG 100 UNIT/ML injection INJECT 20-25 UNITS INTO THE SKIN 3 TIMES A DAY BEFORE MEALS 90 mL 1   pravastatin (PRAVACHOL) 40 MG tablet TAKE 1 TABLET BY MOUTH EVERY DAY 90 tablet 0   prednisoLONE acetate (PRED FORTE) 1 % ophthalmic suspension Place 1 drop into the right eye 4 (four) times daily.     Probiotic Product (PROBIOTIC PO) Take 1 capsule by mouth daily.     PROLENSA 0.07 % SOLN SMARTSIG:1 Drop(s) Right Eye Every Evening      SYMBICORT 160-4.5 MCG/ACT inhaler INHALE 2 PUFFS BY MOUTH TWICE A DAY 30.6 each 3   [DISCONTINUED] prochlorperazine (COMPAZINE) 10 MG tablet Take 1 tablet (10 mg total) by mouth every 6 (six) hours as needed (Nausea or vomiting). 30 tablet 1   No current facility-administered medications on file prior to visit.   Allergies  Allergen Reactions   Adhesive [Tape] Other (See Comments)    Takes patient's skin off.   Banana Diarrhea and Nausea Only   Eggs Or Egg-Derived Products Diarrhea and Nausea Only   Latex Other (See Comments)    sensativity   Linzess [Linaclotide] Diarrhea   Ozempic (0.25 Or 0.5 Mg-Dose) [Semaglutide(0.25 Or 0.5mg -Dos)] Diarrhea   Penicillins Rash and Other (See Comments)    Has had cephalosporins without incident   Pine Swelling and Rash   Rose Swelling and Rash   Family History  Problem Relation Age of Onset   Diabetes Father    COPD Father    Stroke Father    Celiac disease Brother    Congenital heart disease Brother    Rheum arthritis Brother    Thyroid disease Brother  Diabetes Brother    Dementia Brother        Lewy Body   Alzheimer's disease Mother    Arthritis Mother    Other Son        Died age 44 -Tetrology of Fallot - VSD/pulmonary atresia   Breast cancer Other        postmenopausal when diagnosed   PE: BP (!) 148/60 (BP Location: Left Arm, Patient Position: Sitting, Cuff Size: Normal)   Pulse 81   Ht 5\' 1"  (1.549 m)   Wt 262 lb (118.8 kg)   LMP 08/10/2010 Comment: Hysterectomy 02/02/18  SpO2 94%   BMI 49.50 kg/m  Wt Readings from Last 3 Encounters:  03/08/21 262 lb (118.8 kg)  03/01/21 261 lb 6.4 oz (118.6 kg)  02/17/21 265 lb 3.2 oz (120.3 kg)   Constitutional: obese, in NAD, in wheelchair Eyes: PERRLA, EOMI, no exophthalmos ENT: moist mucous membranes, no thyromegaly, no cervical lymphadenopathy Cardiovascular: RRR, No MRG, + bilateral LE pitting edema Respiratory: CTA B Gastrointestinal: abdomen soft, NT, ND,  BS+ Musculoskeletal: no deformities, strength intact in all 4 Skin: moist, warm Neurological: no tremor with outstretched hands, DTR normal in all 4  ASSESSMENT: 1. DM2, insulin-dependent, uncontrolled, with complications - mild NP DR OD  2. HL  3.  Obesity class III  4.  Buttocks pressure ulcer  PLAN:  1. Patient with longstanding, uncontrolled, type 2 diabetes, on metformin and basal-bolus insulin regimen with NPH and NovoLog insulin, with improved control in the last few months.  We did try to start Ozempic but she could not tolerate it due to GI side effects.  At last visit, sugars were higher after breakfast, after which they were decreasing abruptly and they were normal afterwards.  She was eating a snack at night, usually yogurt, to prevent lower blood sugars.  I advised her to stop the snack but we reduced her insulin doses.  To avoid lower blood sugars from 1 PM to 12 AM, we also reduced NPH with breakfast and lunch and NovoLog before breakfast, lunch, and dinner. CGM interpretation: -At today's visit, we reviewed her CGM downloads: It appears that 87% of values are in target range (goal >70%), while 12% are higher than 180 (goal <25%), and 1% are lower than 70 (goal <4%).  The calculated average blood sugar is 134.  The projected HbA1c for the next 3 months (GMI) is 6.5%. -Reviewing the CGM trends, it appears that her blood sugars are much better controlled, but she still has some low blood sugars in the 50s and 60s, mostly in the afternoon, between 3 PM and 6 PM.  Therefore, for now, I advised her to reduce the dose of NPH and NovoLog before lunch.  For now, we will leave the rest of the insulin doses the same, especially with the holidays coming up.  We did discuss that okay and occasional blood sugar in the 60s, may not need to trigger a change in the regimen, however, if she has multiple blood sugars in the 50s and 60s, we definitely need to reduce her insulin doses. - I suggested  to:  Patient Instructions  Please continue: - Metformin 500 mg in am and 1000 mg with dinner  Please change: Insulin Before breakfast Before lunch Before dinner  Novolog 22-25 10-15 15-20  NPH 20 15 35   Please come back for a follow-up appointment in 4 months.  - we checked her HbA1c: 6.6% (higher) - advised to check sugars at different times  of the day - 4x a day, rotating check times - advised for yearly eye exams >> she is UTD.  Her vision improved significantly after having cataract surgery and this is the first time since age 53 years old, when she sees very well without glasses - return to clinic in 3 months  2. HL -Reviewed latest lipid panel from 02/02/2021: LDL at goal, as are her triglycerides, but HDL slightly low: Lab Results  Component Value Date   CHOL 109 02/02/2021   HDL 37.80 (L) 02/02/2021   LDLCALC 41 02/02/2021   LDLDIRECT 78.0 10/05/2018   TRIG 150.0 (H) 02/02/2021   CHOLHDL 3 02/02/2021  -Continues pravastatin 20 mg daily without side effects  3.  Obesity class III -Unfortunately, she could not tolerate Ozempic which would have helped with weight loss -Reducing insulin doses should also help with weight loss -She lost approximately 4 pounds before last visit, up 2 lbs now  4.  Pressure ulcer on buttocks -Healed now -At last visit I prescribed doxycycline 100 mg twice a day for 7 days and referred her to wound care  Philemon Kingdom, MD PhD [facility] Endocrinology

## 2021-03-08 NOTE — Patient Instructions (Addendum)
Please continue: - Metformin 500 mg in am and 1000 mg with dinner  Please change: Insulin Before breakfast Before lunch Before dinner  Novolog 22-25 10-15 15-20  NPH 20 15 35   Please come back for a follow-up appointment in 4 months.

## 2021-03-17 NOTE — Telephone Encounter (Signed)
Inbound fax requesting recent clinical notes. Clinical notes routed via Epic to [facility] at [phone].

## 2021-03-24 ENCOUNTER — Other Ambulatory Visit: Payer: Self-pay | Admitting: Family Medicine

## 2021-03-25 ENCOUNTER — Other Ambulatory Visit: Payer: Self-pay | Admitting: Family Medicine

## 2021-04-06 DIAGNOSIS — R600 Localized edema: Secondary | ICD-10-CM | POA: Diagnosis not present

## 2021-04-06 MED ORDER — TRIAMCINOLONE ACETONIDE 40 MG/ML IJ SUSP
40.0000 mg | Freq: Once | INTRAMUSCULAR | Status: AC
  Administered 2021-04-06: 10:00:00 40 mg via INTRA_ARTICULAR

## 2021-04-06 NOTE — Addendum Note (Signed)
Addended by: Deveron Furlong D on: 04/06/2021 10:00 AM   Modules accepted: Orders

## 2021-04-07 ENCOUNTER — Other Ambulatory Visit: Payer: Self-pay | Admitting: Internal Medicine

## 2021-04-09 ENCOUNTER — Other Ambulatory Visit: Payer: Self-pay | Admitting: Family Medicine

## 2021-04-17 ENCOUNTER — Other Ambulatory Visit: Payer: Self-pay | Admitting: Family Medicine

## 2021-04-19 ENCOUNTER — Other Ambulatory Visit: Payer: Self-pay | Admitting: Family Medicine

## 2021-05-05 ENCOUNTER — Ambulatory Visit (INDEPENDENT_AMBULATORY_CARE_PROVIDER_SITE_OTHER): Payer: Medicare Other

## 2021-05-05 ENCOUNTER — Other Ambulatory Visit: Payer: Self-pay

## 2021-05-05 ENCOUNTER — Ambulatory Visit: Payer: Medicare Other

## 2021-05-05 DIAGNOSIS — Z0000 Encounter for general adult medical examination without abnormal findings: Secondary | ICD-10-CM

## 2021-05-05 NOTE — Progress Notes (Signed)
Virtual Visit via Telephone Note  I connected with  [name] on 05/05/21 at  1:00 PM EST by telephone and verified that I am speaking with the correct person using two identifiers.  Medicare Annual Wellness visit completed telephonically due to Covid-19 pandemic.   Persons participating in this call: This Health Coach and this patient.   Location: Patient: home Provider: office   I discussed the limitations, risks, security and privacy concerns of performing an evaluation and management service by telephone and the availability of in person appointments. The patient expressed understanding and agreed to proceed.  Unable to perform video visit due to video visit attempted and failed and/or patient does not have video capability.   Some vital signs may be absent or patient reported.   Willette Brace, LPN   Subjective:   [name] is a 71 y.o. female who presents for Medicare Annual (Subsequent) preventive examination.  Review of Systems     Cardiac Risk Factors include: advanced age (>74men, >42 women);hypertension;dyslipidemia;diabetes mellitus;obesity (BMI >30kg/m2)     Objective:    There were no vitals filed for this visit. There is no height or weight on file to calculate BMI.  Advanced Directives 05/05/2021 12/17/2020 06/15/2020 04/29/2020 03/12/2020 02/27/2020 01/01/2020  Does Patient Have a Medical Advance Directive? Yes Yes Yes Yes No Yes Yes  Type of [employer] of [place];Living will [place];Living will - [place];Living will -  Does patient want to make changes to medical advance directive? - No - Patient declined - - - - No - Patient declined  Copy of [place] in Chart? No - copy requested - No - copy requested No - copy requested - No - copy requested -    Current Medications (verified) Outpatient Encounter Medications as of 05/05/2021   Medication Sig   albuterol (PROVENTIL) (2.5 MG/3ML) 0.083% nebulizer solution Take 3 mLs (2.5 mg total) by nebulization every 4 (four) hours as needed for wheezing or shortness of breath (Dx: J45.50).   albuterol (VENTOLIN HFA) 108 (90 Base) MCG/ACT inhaler INHALE 2 PUFFS INTO LUNGS EVERY 6 HOURS AS NEEDED FOR WHEEZING/SHORTNESS OF BREATH   amitriptyline (ELAVIL) 25 MG tablet 2 tabs po qhs   clobetasol ointment (TEMOVATE) 8.93 % Apply 1 application topically at bedtime. Apply to the skin of the vulva for 12 weeks   Continuous Blood Gluc Receiver (FREESTYLE LIBRE 14 DAY READER) DEVI by Does not apply route.   diclofenac Sodium (VOLTAREN) 1 % GEL Apply 2 g topically 4 (four) times daily.   diphenoxylate-atropine (LOMOTIL) 2.5-0.025 MG tablet 1-2 tabs po qid prn diarrhea   felodipine (PLENDIL) 10 MG 24 hr tablet TAKE 1 TABLET (10 MG TOTAL) BY MOUTH DAILY. OFFICE VISIT NEEDED FOR FURTHER REFILLS   fluticasone (CUTIVATE) 0.05 % cream APPLY TO AFFECTED AREA OF R LOWER LEG TWICE PER DAY.   fluticasone (VERAMYST) 27.5 MCG/SPRAY nasal spray Place 1 spray into the nose daily as needed for rhinitis.   furosemide (LASIX) 20 MG tablet Take 2 tablets (40 mg total) by mouth daily.   HYDROcodone-acetaminophen (NORCO/VICODIN) 5-325 MG tablet 1-2 tabs po bid prn pain   insulin NPH Human (NOVOLIN N) 100 UNIT/ML injection INJECT 30 UNITS UNDER THE SKIN 3 TIMES A DAY   Insulin Syringes, Disposable, U-100 0.3 ML MISC Use to inject insulin--6 injections per day   levocetirizine (XYZAL) 5 MG tablet Take 5 mg by mouth at  bedtime as needed for allergies.    levothyroxine (SYNTHROID) 125 MCG tablet TAKE 1 TABLET BY MOUTH EVERY DAY   lisinopril (ZESTRIL) 10 MG tablet Take 1 tablet (10 mg total) by mouth daily. OFFICE VISIT NEEDED FOR FURTHER REFILLS   metFORMIN (GLUCOPHAGE) 500 MG tablet TAKE 1 TABLET BY MOUTH EVERY MORNING AND 2 TABLETS BY MOUTH AT BEDTIME.   miconazole (MICOTIN) 2 % powder Apply 1 application topically 2  (two) times daily as needed for itching.   NOVOLOG 100 UNIT/ML injection INJECT 20-25 UNITS INTO THE SKIN 3 TIMES A DAY BEFORE MEALS   pravastatin (PRAVACHOL) 40 MG tablet Take 1 tablet (40 mg total) by mouth daily. OFFICE VISIT NEEDED FOR FURTHER REFILLS   Probiotic Product (PROBIOTIC PO) Take 1 capsule by mouth daily.   SYMBICORT 160-4.5 MCG/ACT inhaler INHALE 2 PUFFS BY MOUTH TWICE A DAY   [DISCONTINUED] BESIVANCE 0.6 % SUSP Place 1 drop into the right eye 3 (three) times daily.   [DISCONTINUED] prednisoLONE acetate (PRED FORTE) 1 % ophthalmic suspension Place 1 drop into the right eye 4 (four) times daily.   [DISCONTINUED] prochlorperazine (COMPAZINE) 10 MG tablet Take 1 tablet (10 mg total) by mouth every 6 (six) hours as needed (Nausea or vomiting).   [DISCONTINUED] PROLENSA 0.07 % SOLN SMARTSIG:1 Drop(s) Right Eye Every Evening   No facility-administered encounter medications on file as of 05/05/2021.    Allergies (verified) Adhesive [tape], Banana, Eggs or egg-derived products, Latex, Linzess [linaclotide], Ozempic (0.25 or 0.5 mg-dose) [semaglutide(0.25 or 0.5mg -dos)], Penicillins, Pine, and Rose   History: Past Medical History:  Diagnosis Date   Allergic rhinitis    Allergy testing: results pending as of 05/28/16 (Dr. Harold Hedge)   Anemia 10/2020   anemia of chronic dz (? + mild IDA?--iron started   Arthritis    Chemotherapy-induced neuropathy ([place]) 2019   Cough variant asthma    DDD (degenerative disc disease), lumbar    Diabetes mellitus with complication ([place])    Mild nonprolif DR R eye 12/2017-->referred to retinal specialist DM MANAGED BY DR. GHERGHE AS OF 2021   Endometrial cancer ([place]) 02/2018   REMISSION AS OF 08/2018.  Stage III (T3, Nx, Mx)  Path: endometroid adenocarcinoma involving cervix, uterus, and fallopian tubes (Pt got TAH & BSO 02/2018). No sign of recurrence as of onc f/u 05/2019.   GERD (gastroesophageal reflux disease)    Hemorrhoids    Herpes zoster  10/2015   L side belt-line   History of adenomatous polyp of colon 02/25/2013   5 mm cecal polyp removed by Dr. Trevor Mace 5 yrs   History of blood transfusion    History of cellulitis 08/2010   Left (Since replacemnt of Left knee   History of radiation therapy 05/21/2018   [facility] brachytherapy 04/24/2018-05/21/2018  Dr Gery Pray   Hyperkalemia 03/2018   started after pt started getting chemo-->had to stop enalapril.   Hyperlipidemia    Not on statin b/c lipids "stayed down when sugars came down" per pt.  She says Dr. Chalmers Cater knows she is not on statin anymore.   Hypertension    Hypothyroidism    Nephrolithiasis 07/2005   Obesity    OSA on CPAP    Osteoarthritis    knees, ankle, + ? scapholunate ligament disruption (x-ray 06/2017)--ortho referral.   Pancytopenia due to antineoplastic chemotherapy ([place])    progressive as of 06/2018. Stable 08/2018.   Recurrent UTI    started cipro 250 qd 09/2018   Sciatica of left side  Severe persistent asthma    cough-variant--saw Allergist 05/25/16 and was switched from max dose advair to symbicort.   Systolic murmur    ECHO fine 10/2016-->murmur likely flow murmur assoc with HTN.   Zoon's vulvitis    Bx-proven (GYN) -lichen sclerosis.  Clobetasol 0.05% ointment per GYN   Past Surgical History:  Procedure Laterality Date   ANKLE FRACTURE SURGERY  1984   Pin & repair   ARTHROSCOPIC REPAIR ACL  2000   Due to ACL tear   ARTHROSCOPIC REPAIR ACL  5/08   Blateral knee rerlacements x4 2 times each knee     BREAST BIOPSY Right 2014   Benign   CARDIAC CATHETERIZATION  5/07   clear vessel mild mitral    CARPAL TUNNEL RELEASE Right [id number]   1989 Left   CESAREAN SECTION  1985   CHOLECYSTECTOMY OPEN  1988   COLONOSCOPY N/A 02/25/2013   Tubular adenoma x 1: Recall 5 yrs. Procedure: COLONOSCOPY;  Surgeon: Juanita Craver, MD;  Location: [facility];  Service: Endoscopy;  Laterality: N/A;   COMBINED HYSTEROSCOPY DIAGNOSTIC / D&C  2/12   Bx neg    DEXA  08/2007; 05/12/20   Bone density normal 2009 and 2022.  Plan rpt 2024.   DILATION AND CURETTAGE OF UTERUS  12/11   IR IMAGING GUIDED PORT INSERTION  03/16/2018   IR REMOVAL TUN ACCESS W/ PORT W/O FL MOD SED  09/20/2018   LYMPH NODE BIOPSY N/A 02/19/2018   Procedure: Sentinel LYMPH NODE BIOPSY;  Surgeon: Everitt Amber, MD;  Location: [facility];  Service: Gynecology;  Laterality: N/A;   REPLACEMENT TOTAL KNEE Left 5/12   X 2 on each   ROBOTIC ASSISTED TOTAL HYSTERECTOMY WITH BILATERAL SALPINGO OOPHERECTOMY N/A 02/19/2018   For endometrial cancer.  Procedure: XI ROBOTIC ASSISTED TOTAL HYSTERECTOMY WITH BILATERAL SALPINGO OOPHORECTOMY;  Surgeon: Everitt Amber, MD;  Location: [facility];  Service: Gynecology;  Laterality: N/A;   TRANSTHORACIC ECHOCARDIOGRAM  10/11/2016    EF 55-60%, grd I DD.   TUBAL LIGATION  1985   C-Section   Family History  Problem Relation Age of Onset   Diabetes Father    COPD Father    Stroke Father    Celiac disease Brother    Congenital heart disease Brother    Rheum arthritis Brother    Thyroid disease Brother    Diabetes Brother    Dementia Brother        Lewy Body   Alzheimer's disease Mother    Arthritis Mother    Other Son        Died age 73 -Tetrology of Fallot - VSD/pulmonary atresia   Breast cancer Other        postmenopausal when diagnosed   Social History   Socioeconomic History   Marital status: Married    Spouse name: Louie Casa   Number of children: 2   Years of education: Not on file   Highest education level: Bachelor's degree (e.g., BA, AB, BS)  Occupational History   Occupation: retired Therapist, sports  Tobacco Use   Smoking status: Never   Smokeless tobacco: Never  Vaping Use   Vaping Use: Never used  Substance and Sexual Activity   Alcohol use: No    Alcohol/week: 0.0 standard drinks   Drug use: No   Sexual activity: Never    Partners: Male    Birth control/protection: I.U.D.  Other Topics Concern   Not on  file  Social History Narrative   Not on  file   Social Determinants of Health   Financial Resource Strain: Low Risk    Difficulty of Paying Living Expenses: Not hard at all  Food Insecurity: No Food Insecurity   Worried About Charity fundraiser in the Last Year: Never true   Arboriculturist in the Last Year: Never true  Transportation Needs: No Transportation Needs   Lack of Transportation (Medical): No   Lack of Transportation (Non-Medical): No  Physical Activity: Inactive   Days of Exercise per Week: 0 days   Minutes of Exercise per Session: 0 min  Stress: No Stress Concern Present   Feeling of Stress : Not at all  Social Connections: Moderately Isolated   Frequency of Communication with Friends and Family: Three times a week   Frequency of Social Gatherings with Friends and Family: More than three times a week   Attends Religious Services: Never   Marine scientist or Organizations: No   Attends Music therapist: Never   Marital Status: Married    Tobacco Counseling Counseling given: Not Answered   Clinical Intake:  Pre-visit preparation completed: Yes  Pain : No/denies pain     BMI - recorded: 49.53 Nutritional Status: BMI > 30  Obese Nutritional Risks: None Diabetes: Yes CBG done?: Yes (114) CBG resulted in Enter/ Edit results?: No Did pt. bring in CBG monitor from home?: No  How often do you need to have someone help you when you read instructions, pamphlets, or other written materials from your doctor or pharmacy?: 1 - Never  Diabetic?Nutrition Risk Assessment:  Has the patient had any N/V/D within the last 2 months?  No  Does the patient have any non-healing wounds?  No  Has the patient had any unintentional weight loss or weight gain?  No   Diabetes:  Is the patient diabetic?  Yes  If diabetic, was a CBG obtained today?  Yes  Did the patient bring in their glucometer from home?  No  How often do you monitor your CBG's? daily.    Financial Strains and Diabetes Management:  Are you having any financial strains with the device, your supplies or your medication? No .  Does the patient want to be seen by Chronic Care Management for management of their diabetes?  No  Would the patient like to be referred to a Nutritionist or for Diabetic Management?  No   Diabetic Exams:  Diabetic Eye Exam: Completed 01/05/21 Diabetic Foot Exam: Overdue, Pt has been advised about the importance in completing this exam. Pt is scheduled for diabetic foot exam on next appt .   Interpreter Needed?: No  Information entered by :: Charlott Rakes, LPN   Activities of Daily Living In your present state of health, do you have any difficulty performing the following activities: 05/05/2021  Hearing? N  Vision? N  Difficulty concentrating or making decisions? N  Walking or climbing stairs? Y  Comment has a chair lift  Dressing or bathing? N  Doing errands, shopping? N  Preparing Food and eating ? N  Using the Toilet? N  In the past six months, have you accidently leaked urine? Y  Do you have problems with loss of bowel control? N  Managing your Medications? N  Managing your Finances? N  Housekeeping or managing your Housekeeping? N  Some recent data might be hidden    Patient Care Team: Tammi Sou, MD as PCP - General (Family Medicine) Harold Hedge, Darrick Grinder, MD as Consulting  Physician (Allergy and Immunology) Juanita Craver, MD as Consulting Physician (Gastroenterology) [place], Peter M, MD as Consulting Physician (Cardiology) Megan Salon, MD as Consulting Physician (Gynecology) Isabel Caprice, MD (Inactive) as Consulting Physician (Gynecologic Oncology) Everitt Amber, MD as Consulting Physician (Gynecologic Oncology) Heath Lark, MD as Consulting Physician (Hematology and Oncology) Philemon Kingdom, MD as Consulting Physician (Endocrinology)  Indicate any recent Medical Services you may have received from other than  [facility] providers in the past year (date may be approximate).     Assessment:   This is a routine wellness examination for [place].  Hearing/Vision screen Hearing Screening - Comments:: Pt denies any hearing issue  Vision Screening - Comments:: Pt follows up with eye care group for annual eye exams   Dietary issues and exercise activities discussed: Current Exercise Habits: The patient does not participate in regular exercise at present   Goals Addressed             This Visit's Progress    Patient Stated       None at this time       Depression Screen PHQ 2/9 Scores 05/05/2021 03/01/2021 04/29/2020 11/13/2019 08/22/2018 03/07/2018 01/03/2018  PHQ - 2 Score 0 0 0 0 0 1 0    Fall Risk Fall Risk  05/05/2021 03/01/2021 02/28/2021 04/29/2020 08/22/2018  Falls in the past year? 1 1 1 1  0  Number falls in past yr: 1 0 1 1 0  Injury with Fall? 1 0 0 0 0  Comment sore - - - -  Risk Factor Category  - - - - -  Risk for fall due to : Impaired balance/gait;Impaired mobility;Impaired vision - - History of fall(s) -  Risk for fall due to: Comment at times - - - -  Follow up Falls prevention discussed Falls evaluation completed - Falls prevention discussed Falls evaluation completed    FALL RISK PREVENTION PERTAINING TO THE HOME:  Any stairs in or around the home? Yes  If so, are there any without handrails? No  Home free of loose throw rugs in walkways, pet beds, electrical cords, etc? Yes  Adequate lighting in your home to reduce risk of falls? Yes   ASSISTIVE DEVICES UTILIZED TO PREVENT FALLS:  Life alert? Yes  through alarm with security  Use of a cane, walker or w/c? Yes  Grab bars in the bathroom? No  Shower chair or bench in shower? Yes  Elevated toilet seat or a handicapped toilet? No   TIMED UP AND GO:  Was the test performed? No .  Cognitive Function:     6CIT Screen 05/05/2021  What Year? 0 points  What month? 0 points  What time? 0 points  Count back from 20 0 points   Months in reverse 0 points  Repeat phrase 2 points  Total Score 2    Immunizations Immunization History  Administered Date(s) Administered   DT (Pediatric) 04/11/1992   Influenza Inj Mdck Quad Pf 12/18/2016   Influenza, Quadrivalent, Recombinant, Inj, Pf 12/28/2015, 12/30/2018   Influenza,trivalent, recombinat, inj, PF 03/20/2015   Influenza-Unspecified 01/08/2018, 01/15/2020   Moderna Sars-Covid-2 Vaccination 07/01/2019, 08/01/2019   Pneumococcal Conjugate-13 06/28/2017   Pneumococcal Polysaccharide-23 04/12/1999, 05/03/2016   Td 08/10/2006   Tdap 10/17/2016    TDAP status: Up to date  Flu Vaccine status: Due, Education has been provided regarding the importance of this vaccine. Advised may receive this vaccine at local pharmacy or Health Dept. Aware to provide a copy of the vaccination record if  obtained from local pharmacy or Health Dept. Verbalized acceptance and understanding.  Pneumococcal vaccine status: Up to date  Covid-19 vaccine status: Completed vaccines  Qualifies for Shingles Vaccine? Yes   Zostavax completed No   Shingrix Completed?: No.    Education has been provided regarding the importance of this vaccine. Patient has been advised to call insurance company to determine out of pocket expense if they have not yet received this vaccine. Advised may also receive vaccine at local pharmacy or Health Dept. Verbalized acceptance and understanding.  Screening Tests Health Maintenance  Topic Date Due   FOOT EXAM  10/05/2018   COVID-19 Vaccine (3 - Moderna risk series) 08/29/2019   Zoster Vaccines- Shingrix (1 of 2) 05/05/2021 (Originally 12/28/1969)   INFLUENZA VACCINE  07/09/2021 (Originally 11/09/2020)   MAMMOGRAM  05/12/2021   HEMOGLOBIN A1C  09/05/2021   OPHTHALMOLOGY EXAM  01/05/2022   COLONOSCOPY (Pts 45-92yrs Insurance coverage will need to be confirmed)  02/26/2023   TETANUS/TDAP  10/18/2026   Pneumonia Vaccine 41+ Years old  Completed   DEXA SCAN   Completed   Hepatitis C Screening  Completed   HPV VACCINES  Aged Out   PAP SMEAR-Modifier  Discontinued    Health Maintenance  Health Maintenance Due  Topic Date Due   FOOT EXAM  10/05/2018   COVID-19 Vaccine (3 - Moderna risk series) 08/29/2019    Colorectal cancer screening: Type of screening: Colonoscopy. Completed 02/25/13. Repeat every 10 years  Mammogram status: Completed 05/12/20. Repeat every year  Bone Density status: Completed 05/12/20. Results reflect: Bone density results: NORMAL. Repeat every 2 years.   Additional Screening:  Hepatitis C Screening:  Completed 06/28/17  Vision Screening: Recommended annual ophthalmology exams for early detection of glaucoma and other disorders of the eye. Is the patient up to date with their annual eye exam?  Yes  Who is the provider or what is the name of the office in which the patient attends annual eye exams? Eye care group If pt is not established with a provider, would they like to be referred to a provider to establish care? No .   Dental Screening: Recommended annual dental exams for proper oral hygiene  Community Resource Referral / Chronic Care Management: CRR required this visit?  No   CCM required this visit?  No      Plan:     I have personally reviewed and noted the following in the patients chart:   Medical and social history Use of alcohol, tobacco or illicit drugs  Current medications and supplements including opioid prescriptions.  Functional ability and status Nutritional status Physical activity Advanced directives List of other physicians Hospitalizations, surgeries, and ER visits in previous 12 months Vitals Screenings to include cognitive, depression, and falls Referrals and appointments  In addition, I have reviewed and discussed with patient certain preventive protocols, quality metrics, and best practice recommendations. A written personalized care plan for preventive services as well as  general preventive health recommendations were provided to patient.     Willette Brace, LPN   [id number]   Nurse Notes: None

## 2021-05-05 NOTE — Patient Instructions (Addendum)
[name] , Thank you for taking time to come for your Medicare Wellness Visit. I appreciate your ongoing commitment to your health goals. Please review the following plan we discussed and let me know if I can assist you in the future.   Screening recommendations/referrals: Colonoscopy: Done 02/25/13 repeat every 10 years  Mammogram: Done 05/12/20 repeat every year  Bone Density: Done 05/12/20 repeat every year  Recommended yearly ophthalmology/optometry visit for glaucoma screening and checkup Recommended yearly dental visit for hygiene and checkup  Vaccinations: Influenza vaccine: Due and discussed  Pneumococcal vaccine: Up to date Tdap vaccine: Done 10/17/16 repeat every 10 years  Shingles vaccine: Shingrix discussed. Please contact your pharmacy for coverage information.    Covid-19:Completed 3/22 & 08/01/19  Advanced directives: Please bring a copy of your health care power of attorney and living will to the office at your convenience.  Conditions/risks identified: none at this time   Next appointment: Follow up in one year for your annual wellness visit    Preventive Care 65 Years and Older, Female Preventive care refers to lifestyle choices and visits with your health care provider that can promote health and wellness. What does preventive care include? A yearly physical exam. This is also called an annual well check. Dental exams once or twice a year. Routine eye exams. Ask your health care provider how often you should have your eyes checked. Personal lifestyle choices, including: Daily care of your teeth and gums. Regular physical activity. Eating a healthy diet. Avoiding tobacco and drug use. Limiting alcohol use. Practicing safe sex. Taking low-dose aspirin every day. Taking vitamin and mineral supplements as recommended by your health care provider. What happens during an annual well check? The services and screenings done by your health care provider during your annual  well check will depend on your age, overall health, lifestyle risk factors, and family history of disease. Counseling  Your health care provider may ask you questions about your: Alcohol use. Tobacco use. Drug use. Emotional well-being. Home and relationship well-being. Sexual activity. Eating habits. History of falls. Memory and ability to understand (cognition). Work and work Statistician. Reproductive health. Screening  You may have the following tests or measurements: Height, weight, and BMI. Blood pressure. Lipid and cholesterol levels. These may be checked every 5 years, or more frequently if you are over 82 years old. Skin check. Lung cancer screening. You may have this screening every year starting at age 5 if you have a 30-pack-year history of smoking and currently smoke or have quit within the past 15 years. Fecal occult blood test (FOBT) of the stool. You may have this test every year starting at age 62. Flexible sigmoidoscopy or colonoscopy. You may have a sigmoidoscopy every 5 years or a colonoscopy every 10 years starting at age 50. Hepatitis C blood test. Hepatitis B blood test. Sexually transmitted disease (STD) testing. Diabetes screening. This is done by checking your blood sugar (glucose) after you have not eaten for a while (fasting). You may have this done every 1-3 years. Bone density scan. This is done to screen for osteoporosis. You may have this done starting at age 94. Mammogram. This may be done every 1-2 years. Talk to your health care provider about how often you should have regular mammograms. Talk with your health care provider about your test results, treatment options, and if necessary, the need for more tests. Vaccines  Your health care provider may recommend certain vaccines, such as: Influenza vaccine. This is recommended every  year. Tetanus, diphtheria, and acellular pertussis (Tdap, Td) vaccine. You may need a Td booster every 10 years. Zoster  vaccine. You may need this after age 38. Pneumococcal 13-valent conjugate (PCV13) vaccine. One dose is recommended after age 50. Pneumococcal polysaccharide (PPSV23) vaccine. One dose is recommended after age 67. Talk to your health care provider about which screenings and vaccines you need and how often you need them. This information is not intended to replace advice given to you by your health care provider. Make sure you discuss any questions you have with your health care provider. Document Released: 04/24/2015 Document Revised: 12/16/2015 Document Reviewed: 01/27/2015 Elsevier Interactive Patient Education  2017 [place] Prevention in the Home Falls can cause injuries. They can happen to people of all ages. There are many things you can do to make your home safe and to help prevent falls. What can I do on the outside of my home? Regularly fix the edges of walkways and driveways and fix any cracks. Remove anything that might make you trip as you walk through a door, such as a raised step or threshold. Trim any bushes or trees on the path to your home. Use bright outdoor lighting. Clear any walking paths of anything that might make someone trip, such as rocks or tools. Regularly check to see if handrails are loose or broken. Make sure that both sides of any steps have handrails. Any raised decks and porches should have guardrails on the edges. Have any leaves, snow, or ice cleared regularly. Use sand or salt on walking paths during winter. Clean up any spills in your garage right away. This includes oil or grease spills. What can I do in the bathroom? Use night lights. Install grab bars by the toilet and in the tub and shower. Do not use towel bars as grab bars. Use non-skid mats or decals in the tub or shower. If you need to sit down in the shower, use a plastic, non-slip stool. Keep the floor dry. Clean up any water that spills on the floor as soon as it happens. Remove  soap buildup in the tub or shower regularly. Attach bath mats securely with double-sided non-slip rug tape. Do not have throw rugs and other things on the floor that can make you trip. What can I do in the bedroom? Use night lights. Make sure that you have a light by your bed that is easy to reach. Do not use any sheets or blankets that are too big for your bed. They should not hang down onto the floor. Have a firm chair that has side arms. You can use this for support while you get dressed. Do not have throw rugs and other things on the floor that can make you trip. What can I do in the kitchen? Clean up any spills right away. Avoid walking on wet floors. Keep items that you use a lot in easy-to-reach places. If you need to reach something above you, use a strong step stool that has a grab bar. Keep electrical cords out of the way. Do not use floor polish or wax that makes floors slippery. If you must use wax, use non-skid floor wax. Do not have throw rugs and other things on the floor that can make you trip. What can I do with my stairs? Do not leave any items on the stairs. Make sure that there are handrails on both sides of the stairs and use them. Fix handrails that are broken or  loose. Make sure that handrails are as long as the stairways. Check any carpeting to make sure that it is firmly attached to the stairs. Fix any carpet that is loose or worn. Avoid having throw rugs at the top or bottom of the stairs. If you do have throw rugs, attach them to the floor with carpet tape. Make sure that you have a light switch at the top of the stairs and the bottom of the stairs. If you do not have them, ask someone to add them for you. What else can I do to help prevent falls? Wear shoes that: Do not have high heels. Have rubber bottoms. Are comfortable and fit you well. Are closed at the toe. Do not wear sandals. If you use a stepladder: Make sure that it is fully opened. Do not climb a  closed stepladder. Make sure that both sides of the stepladder are locked into place. Ask someone to hold it for you, if possible. Clearly mark and make sure that you can see: Any grab bars or handrails. First and last steps. Where the edge of each step is. Use tools that help you move around (mobility aids) if they are needed. These include: Canes. Walkers. Scooters. Crutches. Turn on the lights when you go into a dark area. Replace any light bulbs as soon as they burn out. Set up your furniture so you have a clear path. Avoid moving your furniture around. If any of your floors are uneven, fix them. If there are any pets around you, be aware of where they are. Review your medicines with your doctor. Some medicines can make you feel dizzy. This can increase your chance of falling. Ask your doctor what other things that you can do to help prevent falls. This information is not intended to replace advice given to you by your health care provider. Make sure you discuss any questions you have with your health care provider. Document Released: 01/22/2009 Document Revised: 09/03/2015 Document Reviewed: 05/02/2014 Elsevier Interactive Patient Education  2017 [employer].

## 2021-05-24 ENCOUNTER — Other Ambulatory Visit: Payer: Self-pay | Admitting: Family Medicine

## 2021-05-25 NOTE — Telephone Encounter (Signed)
Requesting:norco Contract:02/02/21 UDS:02/02/21 Last Visit:03/01/21 Next Visit: Last Refill:02/02/21 (90,0)  Please Advise

## 2021-05-26 MED ORDER — HYDROCODONE-ACETAMINOPHEN 5-325 MG PO TABS: ORAL_TABLET | ORAL | 0 refills | Status: DC

## 2021-05-26 NOTE — Telephone Encounter (Signed)
Rx is not in stock pharmacy has 10-325 and 7.5-325. Pharmacist states that they do not have a lot of the 7.5 but if wanting to change to 10 to send new Rx. Pharmacy not sure who may have 5-325 in stock at this time.

## 2021-05-27 ENCOUNTER — Other Ambulatory Visit: Payer: Self-pay

## 2021-05-27 NOTE — Telephone Encounter (Signed)
Called requesting new rx for CGM.

## 2021-05-28 ENCOUNTER — Other Ambulatory Visit: Payer: Self-pay | Admitting: Family Medicine

## 2021-05-28 ENCOUNTER — Other Ambulatory Visit: Payer: Self-pay

## 2021-05-28 MED ORDER — HYDROCODONE-ACETAMINOPHEN 5-325 MG PO TABS: ORAL_TABLET | ORAL | 0 refills | Status: DC

## 2021-05-28 MED ORDER — HYDROCODONE-ACETAMINOPHEN 10-325 MG PO TABS
ORAL_TABLET | ORAL | 0 refills | Status: DC
Start: 2021-05-28 — End: 2021-08-10

## 2021-05-28 NOTE — Telephone Encounter (Signed)
Needing brand new prescription sent over   [facility] 5-[facility], they do not have the 5-325   HYDROcodone-acetaminophen HYDROcodone-acetaminophen (NORCO/VICODIN) 5-325 MG tablet  [id number] [place] [place] [address] Phone:  [phone]  Fax:  [phone]

## 2021-05-28 NOTE — Telephone Encounter (Signed)
Please resend Rx to pharmacy. Rx pending.

## 2021-05-28 NOTE — Telephone Encounter (Signed)
Prescription for the 10-325 Norco sent in.

## 2021-05-28 NOTE — Telephone Encounter (Signed)
Last rx for Norco 10-325mg  written on 05/26/21(90,0)  Please review and advise. Med pending for 5-325mg 

## 2021-06-08 ENCOUNTER — Telehealth: Payer: Self-pay | Admitting: *Deleted

## 2021-06-08 NOTE — Telephone Encounter (Signed)
CALLED PATIENT TO INFORM OF FU APPT. WITH DR. LISA JACKSON-MOORE ON 06-24-21 - ARRIVAL TIME- 10:30 AM , SPOKE WITH PATIENT AND SHE IS AWARE OF THIS APPT.

## 2021-06-08 NOTE — Telephone Encounter (Signed)
Shilrey from radiation called and scheduled a follow up appt with Dr Delsa Sale on 3/16

## 2021-06-11 DIAGNOSIS — Z20822 Contact with and (suspected) exposure to covid-19: Secondary | ICD-10-CM | POA: Diagnosis not present

## 2021-06-16 ENCOUNTER — Telehealth: Payer: Self-pay

## 2021-06-16 NOTE — Telephone Encounter (Signed)
Inbound fax requesting forms be completed and faxed with recent clinical notes. Forms completed and faxed to [employer] at [phone] ? ?

## 2021-06-21 ENCOUNTER — Other Ambulatory Visit: Payer: Self-pay | Admitting: Internal Medicine

## 2021-06-22 ENCOUNTER — Other Ambulatory Visit: Payer: Self-pay | Admitting: Family Medicine

## 2021-06-24 ENCOUNTER — Ambulatory Visit: Payer: Medicare Other | Admitting: Obstetrics & Gynecology

## 2021-07-02 ENCOUNTER — Telehealth: Payer: Self-pay

## 2021-07-02 MED ORDER — FUROSEMIDE 20 MG PO TABS: 40.0000 mg | ORAL_TABLET | Freq: Every day | ORAL | 1 refills | Status: DC

## 2021-07-02 NOTE — Telephone Encounter (Signed)
New rx sent in 90 d supply. LM for pt regarding medication. ?

## 2021-07-02 NOTE — Telephone Encounter (Signed)
Noted  

## 2021-07-02 NOTE — Telephone Encounter (Signed)
Patient made aware that new prescription was sent to pharmacy ?

## 2021-07-02 NOTE — Telephone Encounter (Signed)
Patient pharmacy states meds are too soon to fill. It is giving a denial message at pharmacy because the directions has changed since prescription was written.  Patient states Dr. Anitra Lauth increased Generic Lasix to 2 tablets daily. ?Pharmacy has 1 tablet daily, even though current prescription has 3 refills left, pharmacy cannot override unless they have new prescription.  Patient has enough meds to last her until Monday, if unable to get sent in today. ? [facility] [place] ? ?furosemide (LASIX) 20 MG tablet   ?Sig: Take 2 tablets (40 mg total) by mouth daily ?

## 2021-07-02 NOTE — Addendum Note (Signed)
Addended by: Deveron Furlong D on: 07/02/2021 03:52 PM ? ? Modules accepted: Orders ? ?

## 2021-07-08 ENCOUNTER — Other Ambulatory Visit: Payer: Self-pay | Admitting: Family Medicine

## 2021-07-08 ENCOUNTER — Ambulatory Visit: Payer: Medicare Other | Admitting: Internal Medicine

## 2021-07-12 ENCOUNTER — Other Ambulatory Visit: Payer: Self-pay | Admitting: Family Medicine

## 2021-07-16 DIAGNOSIS — Z20822 Contact with and (suspected) exposure to covid-19: Secondary | ICD-10-CM | POA: Diagnosis not present

## 2021-07-19 ENCOUNTER — Other Ambulatory Visit: Payer: Self-pay | Admitting: Family Medicine

## 2021-07-23 ENCOUNTER — Other Ambulatory Visit: Payer: Self-pay | Admitting: Internal Medicine

## 2021-07-26 ENCOUNTER — Ambulatory Visit (INDEPENDENT_AMBULATORY_CARE_PROVIDER_SITE_OTHER): Payer: Medicare Other | Admitting: Internal Medicine

## 2021-07-26 ENCOUNTER — Encounter: Payer: Self-pay | Admitting: Internal Medicine

## 2021-07-26 VITALS — BP 120/58 | HR 107 | Ht 61.0 in | Wt 250.8 lb

## 2021-07-26 DIAGNOSIS — E785 Hyperlipidemia, unspecified: Secondary | ICD-10-CM | POA: Diagnosis not present

## 2021-07-26 DIAGNOSIS — E1165 Type 2 diabetes mellitus with hyperglycemia: Secondary | ICD-10-CM | POA: Diagnosis not present

## 2021-07-26 DIAGNOSIS — E1142 Type 2 diabetes mellitus with diabetic polyneuropathy: Secondary | ICD-10-CM

## 2021-07-26 LAB — POCT GLYCOSYLATED HEMOGLOBIN (HGB A1C): Hemoglobin A1C: 6.4 % — AB (ref 4.0–5.6)

## 2021-07-26 NOTE — Progress Notes (Signed)
ID: [name], female   DOB: Sep 03, 1950, 71 y.o.   MRN: [id number]  ? ?This visit occurred during the SARS-CoV-2 public health emergency.  Safety protocols were in place, including screening questions prior to the visit, additional usage of staff PPE, and extensive cleaning of exam room while observing appropriate contact time as indicated for disinfecting solutions.  ? ?HPI: ?[name] is a 71 y.o.-year-old female, initially referred by her PCP, Dr. Ernestine Peters, returning for follow-up for DM2, dx in 1992, insulin-dependent since 2004-2005, uncontrolled, with complications (DR).  Last visit 5 months ago.  On [facility]. ? ?Interim history: ?No increased urination, blurry vision, nausea, chest pain. ?Her buttocks ulcer healed before last visit.  We use doxycycline and she also saw wound care. ? ?Reviewed HbA1c levels: ?Lab Results  ?Component Value Date  ? HGBA1C 6.6 (A) 03/08/2021  ? HGBA1C 6.3 (A) 09/01/2020  ? HGBA1C 9.1 (A) 05/15/2020  ? HGBA1C 7.7 (A) 12/26/2019  ? HGBA1C 6.6 (A) 05/07/2019  ? HGBA1C 8.8 (H) 01/21/2019  ? HGBA1C 7.5 (H) 10/05/2018  ? HGBA1C 7.0 (A) 05/18/2018  ? HGBA1C 7.1 (H) 02/15/2018  ? HGBA1C 7.3 (A) 01/03/2018  ? ?She is on: ?- Metformin 500 mg in am and 1000 mg with dinner ? ?Insulin Before breakfast Before lunch Before dinner  ?Novolog 28 15 >> 10-15 25  ?NPH 25 30 >> 15 35  ? ?She tried Ozempic 0.5 mg weekly >> significant diarrhea, abd. Pain, food intolerance >> started 04/2019 -stopped 07/2019 ? ?She is checking more than 4 times a day with her CGM.  She has to use skin tac to be able to tolerate the adhesive: ? ? ?Previously: ? ? ?Previously: ? ? ?Lowest sugar was 65-70 (more active) >> 50s >> 54 >> 54; she has hypoglycemia awareness in the 70s.  ?Highest sugar was 300 - pizza >> 200 >> 260s >> 273 >> 243 ? ?[facility] ? ?Pt's meals are: ?- Breakfast: oatmeal - instant, peacans, butter >> sausage, bacon, apple sauce, velveeta crackers ?- Lunch: tortilla + PB and J or [place]  and cheese >> yoghurt, apple sauce, half a sandwich ?- Dinner: meat (hamburger, chicken, bratwurst), spaghetti, green beans, mixed veggies ?- Snacks:  ? ?-No CKD, last BUN/creatinine:  ?Lab Results  ?Component Value Date  ? BUN 27 (H) 03/01/2021  ? BUN 29 (H) 02/02/2021  ? CREATININE 0.87 03/01/2021  ? CREATININE 0.84 02/02/2021  ? ?-+ HL; last set of lipids: ?Lab Results  ?Component Value Date  ? CHOL 109 02/02/2021  ? HDL 37.80 (L) 02/02/2021  ? [place] 41 02/02/2021  ? LDLDIRECT 78.0 10/05/2018  ? TRIG 150.0 (H) 02/02/2021  ? CHOLHDL 3 02/02/2021  ?On pravastatin 20. ? ?- last eye exam was on January 05, 2021: No DR  (previously:+ DR).  She had cataract surgery 02/23/2021. ? ?-+ numbness and tingling in her feet -chemotherapy related. ? ?Pt has FH of DM in daughter, brother, father, P aunts. ? ?She also has a history of endometrial cancer - 2019, also, OSA, chemotherapy-induced peripheral neuropathy, hypothyroidism-on levothyroxine 125 mcg daily. ? ?Latest TSH was normal: ?Lab Results  ?Component Value Date  ? TSH 0.76 02/02/2021  ? ?She lost a child at 66 y/o in 24 >> she gained 100 lbs and was dx'ed with DM afterwards. ? ?No h/o pancreatitis or FH of MTC. ? ?Brother died of Lewy body dementia. ? ?ROS: ?+ see HPI ?Neurological: no tremors/+ numbness/+ tingling/no dizziness ? ?I reviewed pt's medications, allergies,  PMH, social hx, family hx, and changes were documented in the history of present illness. Otherwise, unchanged from my initial visit note. ? ?Past Medical History:  ?Diagnosis Date  ? Allergic rhinitis   ? Allergy testing: results pending as of 05/28/16 (Dr. Harold Hedge)  ? Anemia 10/2020  ? anemia of chronic dz (? + mild IDA?--iron started  ? Arthritis   ? Chemotherapy-induced neuropathy ([place]) 2019  ? Cough variant asthma   ? DDD (degenerative disc disease), lumbar   ? Diabetes mellitus with complication ([place])   ? 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  Manha Amato AS OF 2021  ? Endometrial cancer ([place]) 02/2018  ? REMISSION AS OF 08/2018.  Stage III (T3, Nx, Mx)  Path: endometroid adenocarcinoma involving cervix, uterus, and fallopian tubes (Pt got TAH & BSO 02/2018). No sign of recurrence as of onc f/u 05/2019.  ? GERD (gastroesophageal reflux disease)   ? Hemorrhoids   ? Herpes zoster 10/2015  ? L side belt-line  ? History of adenomatous polyp of colon 02/25/2013  ? 5 mm cecal polyp removed by Dr. Trevor Mace 5 yrs  ? History of blood transfusion   ? History of cellulitis 08/2010  ? Left (Since replacemnt of Left knee  ? History of radiation therapy 05/21/2018  ? H[facility] brachytherapy 04/24/2018-05/21/2018  Dr Gery Pray  ? Hyperkalemia 03/2018  ? started after pt started getting chemo-->had to stop enalapril.  ? Hyperlipidemia   ? Not on statin b/c lipids "stayed down when sugars came down" per pt.  She says Dr. Chalmers Cater knows she is not on statin anymore.  ? Hypertension   ? Hypothyroidism   ? Nephrolithiasis 07/2005  ? Obesity   ? OSA on CPAP   ? Osteoarthritis   ? knees, ankle, + ? scapholunate ligament disruption (x-ray 06/2017)--ortho referral.  ? Pancytopenia due to antineoplastic chemotherapy [facility])   ? progressive as of 06/2018. Stable 08/2018.  ? Recurrent UTI   ? started cipro 250 qd 09/2018  ? Sciatica of left side   ? Severe persistent asthma   ? cough-variant--saw Allergist 05/25/16 and was switched from max dose advair to symbicort.  ? Systolic murmur   ? ECHO fine 10/2016-->murmur likely flow murmur assoc with HTN.  ? Zoon's vulvitis   ? Bx-proven (GYN) -lichen sclerosis.  Clobetasol 0.05% ointment per GYN  ? ?Past Surgical History:  ?Procedure Laterality Date  ? [place]  ? Pin & repair  ? ARTHROSCOPIC REPAIR ACL  2000  ? Due to ACL tear  ? ARTHROSCOPIC REPAIR ACL  5/08  ? Blateral knee rerlacements x4 2 times each knee    ? BREAST BIOPSY Right 2014  ? Benign  ? CARDIAC CATHETERIZATION  5/07  ? clear vessel mild mitral   ? CARPAL TUNNEL  RELEASE Right 1984;1989  ? 1989 Left  ? [place]  ? CHOLECYSTECTOMY OPEN  1988  ? COLONOSCOPY N/A 02/25/2013  ? Tubular adenoma x 1: Recall 5 yrs. Procedure: COLONOSCOPY;  Surgeon: Juanita Craver, MD;  Location: [facility];  Service: Endoscopy;  Laterality: N/A;  ? COMBINED HYSTEROSCOPY DIAGNOSTIC / D&C  2/12  ? Bx neg  ? DEXA  08/2007; 05/12/20  ? Bone density normal 2009 and 2022.  Plan rpt 2024.  ? DILATION AND CURETTAGE OF UTERUS  12/11  ? IR IMAGING GUIDED PORT INSERTION  03/16/2018  ? IR REMOVAL TUN ACCESS W/ PORT W/O FL MOD SED  09/20/2018  ? LYMPH NODE BIOPSY N/A 02/19/2018  ? Procedure: [place] LYMPH NODE BIOPSY;  Surgeon: Everitt Amber, MD;  Location[facility];  Service: Gynecology;  Laterality: N/A;  ? REPLACEMENT TOTAL KNEE Left 5/12  ? X 2 on each  ? ROBOTIC ASSISTED TOTAL HYSTERECTOMY WITH BILATERAL SALPINGO OOPHERECTOMY N/A 02/19/2018  ? For endometrial cancer.  Procedure: XI ROBOTIC ASSISTED TOTAL HYSTERECTOMY WITH BILATERAL SALPINGO OOPHORECTOMY;  Surgeon: Everitt Amber, MD;  Location: [place];  Service: Gynecology;  Laterality: N/A;  ? TRANSTHORACIC ECHOCARDIOGRAM  10/11/2016  ?  EF 55-60%, grd I DD.  ? TUBAL LIGATION  1985  ? C-Section  ? ?Social History  ? ?Socioeconomic History  ? Marital status: Married  ?  Spouse name: Louie Casa  ? Number of children: 2  ? Years of education: Not on file  ? Highest education level: Bachelor's degree (e.g., BA, AB, BS)  ?Occupational History  ? Occupation: retired Therapist, sports  ?Tobacco Use  ? Smoking status: Never  ? Smokeless tobacco: Never  ?Vaping Use  ? Vaping Use: Never used  ?Substance and Sexual Activity  ? Alcohol use: No  ?  Alcohol/week: 0.0 standard drinks  ? Drug use: No  ? Sexual activity: Never  ?  Partners: Male  ?  Birth control/protection: I.U.D.  ?Other Topics Concern  ? Not on file  ?Social History Narrative  ? Not on file  ? ?Social Determinants of Health  ? ?Financial Resource Strain: Low Risk   ? Difficulty of  Paying Living Expenses: Not hard at all  ?Food Insecurity: No Food Insecurity  ? Worried About Charity fundraiser in the Last Year: Never true  ? Ran Out of Food in the Last Year: Never true  ?Transportation

## 2021-07-26 NOTE — Patient Instructions (Addendum)
Please continue: ?- Metformin 500 mg in am and 1000 mg with dinner ? ?Try to change: ?Insulin Before breakfast Before lunch Before dinner Bedtime  ?Novolog 22-25 10-15 15-20 -  ?NPH 20 15 - 35  ? ?Please come back for a follow-up appointment in 4 months. ?

## 2021-07-30 ENCOUNTER — Other Ambulatory Visit: Payer: Self-pay | Admitting: Family Medicine

## 2021-08-03 ENCOUNTER — Telehealth: Payer: Self-pay | Admitting: Hematology and Oncology

## 2021-08-03 NOTE — Telephone Encounter (Signed)
NA

## 2021-08-04 ENCOUNTER — Ambulatory Visit: Payer: Medicare Other | Admitting: Obstetrics & Gynecology

## 2021-08-10 ENCOUNTER — Other Ambulatory Visit: Payer: Self-pay | Admitting: Family Medicine

## 2021-08-10 ENCOUNTER — Other Ambulatory Visit: Payer: Self-pay

## 2021-08-10 MED ORDER — LISINOPRIL 10 MG PO TABS: 10.0000 mg | ORAL_TABLET | Freq: Every day | ORAL | 0 refills | Status: DC

## 2021-08-10 MED ORDER — FELODIPINE ER 10 MG PO TB24: 10.0000 mg | ORAL_TABLET | Freq: Every day | ORAL | 0 refills | Status: DC

## 2021-08-10 NOTE — Telephone Encounter (Signed)
Requesting: Norco ?Contract: 02/02/21 ?UDS: 02/02/21 ?Last Visit: 03/01/21 ?Next Visit: 08/25/21  ?Last Refill: 05/28/21(45,0) ? ?Please Advise. Med pending  ?

## 2021-08-10 NOTE — Telephone Encounter (Signed)
30 d/s given on felodipine and lisinopril ?

## 2021-08-10 NOTE — Telephone Encounter (Signed)
Patient refill request.  She has 1 blood pressure pill left.  Patient scheduled appt (first available) with Dr. Anitra Lauth on 08/25/21. ? ?[facility] [place] ? ?felodipine (PLENDIL) 10 MG 24 hr tablet [[id number]]  ? ?lisinopril (ZESTRIL) 10 MG tablet [[id number]]  ? ?Pain meds are due in 3-4 days.  I told patient we could go ahead and accept this as refill request, and provider can mark prescription NOT to fill med until fill date. ? ? ?HYDROcodone-acetaminophen (NORCO) 10-325 MG tablet [[id number]]  ? ? ? ? ? ?

## 2021-08-11 MED ORDER — HYDROCODONE-ACETAMINOPHEN 10-325 MG PO TABS: ORAL_TABLET | ORAL | 0 refills | Status: DC

## 2021-08-13 ENCOUNTER — Other Ambulatory Visit: Payer: Self-pay | Admitting: Family Medicine

## 2021-08-13 ENCOUNTER — Encounter: Payer: Self-pay | Admitting: Obstetrics & Gynecology

## 2021-08-18 ENCOUNTER — Other Ambulatory Visit: Payer: Self-pay

## 2021-08-18 ENCOUNTER — Inpatient Hospital Stay: Payer: Medicare Other | Attending: Obstetrics & Gynecology | Admitting: Obstetrics & Gynecology

## 2021-08-18 ENCOUNTER — Encounter: Payer: Self-pay | Admitting: Obstetrics & Gynecology

## 2021-08-18 VITALS — BP 152/62 | HR 102 | Temp 97.7°F | Resp 16 | Ht 61.0 in | Wt 252.0 lb

## 2021-08-18 DIAGNOSIS — Z9221 Personal history of antineoplastic chemotherapy: Secondary | ICD-10-CM | POA: Diagnosis not present

## 2021-08-18 DIAGNOSIS — Z923 Personal history of irradiation: Secondary | ICD-10-CM | POA: Diagnosis not present

## 2021-08-18 DIAGNOSIS — Z8542 Personal history of malignant neoplasm of other parts of uterus: Secondary | ICD-10-CM | POA: Insufficient documentation

## 2021-08-18 DIAGNOSIS — C541 Malignant neoplasm of endometrium: Secondary | ICD-10-CM

## 2021-08-18 NOTE — Progress Notes (Signed)
Follow Up Note: Gyn-Onc ? ?[name] 71 y.o. female ? ?CC: She presents for a f/u visit ? ? ?HPI: The oncology history was reviewed. ? ?Interval History: She denies any vaginal bleeding, abdominal/pelvic pain, cough, lethargy or increasing abdominal girth. She was seen in f/u by RAD-ONC in 9/22.  Clinically she was felt to be NED.  ? ?Review of Systems  ?Review of Systems  ?Constitutional:  Negative for malaise/fatigue and weight loss.  ?Respiratory:  Negative for shortness of breath and wheezing.   ?Cardiovascular:  Negative for chest pain and leg swelling.  ?Gastrointestinal:  Negative for abdominal pain, blood in stool, constipation, nausea and vomiting.  ?Genitourinary:  Negative for dysuria, frequency, hematuria and urgency.  ?Musculoskeletal:  Negative for joint pain and myalgias.  ?Neurological:  Negative for weakness.  ?Psychiatric/Behavioral:  Negative for depression. The patient does not have insomnia.   ? ?Current medications, allergy, social history, past surgical history, past medical history, family history were all reviewed. ? ? ? ?Vitals: BP (!) 152/62 (BP Location: Right Arm, Patient Position: Sitting)   Pulse (!) 102   Temp 97.7 ?F (36.5 ?C) (Oral)   Resp 16   Ht _0  (1.549 m)   Wt 252 lb (114.3 kg)   LMP 08/10/2010 Comment: Hysterectomy 02/02/18  SpO2 96%   BMI 47.61 kg/m?   ? ?Physical Exam:  ?Physical Exam ?Exam conducted with a chaperone present.  ?Constitutional:   ?   General: She is not in acute distress. ?Cardiovascular:  ?   Rate and Rhythm: Normal rate and regular rhythm.  ?Pulmonary:  ?   Effort: Pulmonary effort is normal.  ?   Breath sounds: Normal breath sounds. No wheezing or rhonchi.  ?Abdominal:  ?   Palpations: Abdomen is soft.  ?   Tenderness: There is no abdominal tenderness. There is no right CVA tenderness or left CVA tenderness.  ?   Hernia: No hernia is present.  ?Genitourinary: ?   General: white, waxy areas; labia minora w/large erosive areas ?   Urethra: No  urethral lesion.  ?   Vagina: No lesions. No bleeding ?Musculoskeletal:  ?   Cervical back: Neck supple.  ?   Right lower leg: No edema.  ?   Left lower leg: No edema.  ?Lymphadenopathy:  ?   Upper Body:  ?   Right upper body: No supraclavicular adenopathy.  ?   Left upper body: No supraclavicular adenopathy.  ?   Lower Body: No right inguinal adenopathy. No left inguinal adenopathy.  ?Skin: ?   Findings: No rash.  ?Neurological:  ?   Mental Status: She is oriented to person, place, and time.  ? ?Assessment/Plan:  ?Endometrial cancer ([place]) ?71 yo with a h/o Endometrial Cancer: stage IIIA grade 1 endometrioid (MMR normal, MSI stable) s/p staging surgery on 02/19/18. ?S/p adjuvant carboplatin and paclitaxel x 6 completed April, 2020. S/p adjuvant vaginal brachy completed February, 2020.  ? ? Negative symptom review, normal exam.  No evidence of recurrence ?  ?>Recommend ongoing 6 montly surveillance until April, 2025.  F/U w/RAD-ONC in 6 mos; return in 1 yr ? ?  ?  ? ? ?Lahoma Crocker, MD  ?

## 2021-08-18 NOTE — Assessment & Plan Note (Addendum)
71 yo with a h/o Endometrial Cancer: stage IIIA grade 1 endometrioid (MMR normal, MSI stable) s/p staging surgery on 02/19/18. ?S/p adjuvant carboplatin and paclitaxel x 6 completed April, 2020. S/p adjuvant vaginal brachy completed February, 2020.  ? ??Negative symptom review, normal exam.  No evidence of recurrence ?? ?>Recommend ongoing 6 montly surveillance until April, 2025.  F/U w/RAD-ONC in 6 mos; return in 1 yr ? ?? ? ?

## 2021-08-18 NOTE — Patient Instructions (Signed)
Return in 1 year ?

## 2021-08-19 ENCOUNTER — Other Ambulatory Visit: Payer: Self-pay | Admitting: Family Medicine

## 2021-08-25 ENCOUNTER — Ambulatory Visit: Payer: Medicare Other | Admitting: Family Medicine

## 2021-09-01 ENCOUNTER — Other Ambulatory Visit: Payer: Self-pay | Admitting: Family Medicine

## 2021-09-01 NOTE — Telephone Encounter (Signed)
Pt has scheduled appt on 5/25

## 2021-09-02 ENCOUNTER — Encounter: Payer: Self-pay | Admitting: Family Medicine

## 2021-09-02 ENCOUNTER — Ambulatory Visit (INDEPENDENT_AMBULATORY_CARE_PROVIDER_SITE_OTHER): Payer: Medicare Other | Admitting: Family Medicine

## 2021-09-02 VITALS — BP 145/68 | HR 93 | Temp 98.0°F | Ht 61.0 in | Wt 250.6 lb

## 2021-09-02 DIAGNOSIS — M159 Polyosteoarthritis, unspecified: Secondary | ICD-10-CM | POA: Diagnosis not present

## 2021-09-02 DIAGNOSIS — E7800 Pure hypercholesterolemia, unspecified: Secondary | ICD-10-CM

## 2021-09-02 DIAGNOSIS — R600 Localized edema: Secondary | ICD-10-CM

## 2021-09-02 DIAGNOSIS — M5441 Lumbago with sciatica, right side: Secondary | ICD-10-CM

## 2021-09-02 DIAGNOSIS — M5442 Lumbago with sciatica, left side: Secondary | ICD-10-CM

## 2021-09-02 DIAGNOSIS — G620 Drug-induced polyneuropathy: Secondary | ICD-10-CM

## 2021-09-02 DIAGNOSIS — T451X5A Adverse effect of antineoplastic and immunosuppressive drugs, initial encounter: Secondary | ICD-10-CM | POA: Diagnosis not present

## 2021-09-02 DIAGNOSIS — G894 Chronic pain syndrome: Secondary | ICD-10-CM

## 2021-09-02 DIAGNOSIS — G8929 Other chronic pain: Secondary | ICD-10-CM | POA: Diagnosis not present

## 2021-09-02 DIAGNOSIS — I10 Essential (primary) hypertension: Secondary | ICD-10-CM

## 2021-09-02 LAB — COMPREHENSIVE METABOLIC PANEL
ALT: 16 U/L (ref 0–35)
AST: 18 U/L (ref 0–37)
Albumin: 3.8 g/dL (ref 3.5–5.2)
Alkaline Phosphatase: 58 U/L (ref 39–117)
BUN: 50 mg/dL — ABNORMAL HIGH (ref 6–23)
CO2: 27 mEq/L (ref 19–32)
Calcium: 9.2 mg/dL (ref 8.4–10.5)
Chloride: 99 mEq/L (ref 96–112)
Creatinine, Ser: 1.07 mg/dL (ref 0.40–1.20)
GFR: 52.57 mL/min — ABNORMAL LOW (ref >60.00)
Glucose, Bld: 117 mg/dL — ABNORMAL HIGH (ref 70–99)
Potassium: 4.7 mEq/L (ref 3.5–5.1)
Sodium: 139 mEq/L (ref 135–145)
Total Bilirubin: 0.2 mg/dL (ref 0.2–1.2)
Total Protein: 7.1 g/dL (ref 6.0–8.3)

## 2021-09-02 MED ORDER — LEVOTHYROXINE SODIUM 125 MCG PO TABS: 125.0000 ug | ORAL_TABLET | Freq: Every day | ORAL | 1 refills | Status: DC

## 2021-09-02 MED ORDER — HYDROCODONE-ACETAMINOPHEN 10-325 MG PO TABS: ORAL_TABLET | ORAL | 0 refills | Status: DC

## 2021-09-02 MED ORDER — FUROSEMIDE 20 MG PO TABS: 20.0000 mg | ORAL_TABLET | Freq: Every day | ORAL | 1 refills | Status: DC

## 2021-09-02 MED ORDER — LISINOPRIL 10 MG PO TABS: 10.0000 mg | ORAL_TABLET | Freq: Every day | ORAL | 1 refills | Status: DC

## 2021-09-02 MED ORDER — FELODIPINE ER 10 MG PO TB24: 10.0000 mg | ORAL_TABLET | Freq: Every day | ORAL | 1 refills | Status: DC

## 2021-09-02 MED ORDER — SYMBICORT 160-4.5 MCG/ACT IN AERO: INHALATION_SPRAY | RESPIRATORY_TRACT | 3 refills | Status: DC

## 2021-09-02 MED ORDER — PRAVASTATIN SODIUM 40 MG PO TABS: 40.0000 mg | ORAL_TABLET | Freq: Every day | ORAL | 1 refills | Status: DC

## 2021-09-02 NOTE — Progress Notes (Signed)
OFFICE VISIT  09/02/2021  CC: f/u pain,edema,htn  Patient is a 71 y.o. female who presents for 6 mo f/u chronic pain syndrome, bilat LE edema, and HTN. A/P as of last visit: "1 bilateral lower extremity edema due to venous insufficiency:  Improving. Continue Lasix at 40 mg. She will restart compression hose and continue attempts to limit sodium.  2. Hypertension: Elevated today at 160/70. Home measurements significantly better than in office measurements with systolics ranging from 259-563.  Of note, her diastolics are on the soft side.  Continue felodipine 10 mg a day. Added Lisinopril 10 mg to also help with BP control as well.  3: Chronic bilateral thumb pain: Resolved, pain is only 2/10. Will reassess need for injections at next visit."  INTERIM HX: She is doing fairly well.   Indication for chronic opioid: osteoarthritis in hands, low back, feet, L hip, knees, wrists+chemotherapy -induced neuropathy (burning and numbness in feet). Takes 1-2 hydrocodone tabs a day typically. She feels like her hand paresthesias and pain is mildly worse lately, right worse than left. She finds the character of the pain hard to describe but seems to be primarily located on the radial aspect of her wrist and hand.  She does have significant neuropathic pain in both feet, which she says amitriptyline helps with. PMP AWARE reviewed today: most recent rx for vicodin was filled 08/11/21, # 52, rx by me. No red flags.  Occasional home blood pressure check less than 130/80. Takes 2 of the 20 mg Lasix tabs per day.  Lower extremity swelling mild. Asthma not bothering her much lately.  ROS as above, plus--> no fevers, no CP, no SOB, no wheezing, no cough, no dizziness, no HAs, no rashes, no melena/hematochezia.  No polyuria or polydipsia.  No myalgias or arthralgias.  No focal weakness, paresthesias, or tremors.  No acute vision or hearing abnormalities.  No dysuria or unusual/new urinary urgency or frequency.   No recent changes in lower legs. No n/v/d or abd pain.  No palpitations.    Past Medical History:  Diagnosis Date   Allergic rhinitis    Allergy testing: results pending as of 05/28/16 (Dr. Harold Hedge)   Anemia 10/2020   anemia of chronic dz (? + mild IDA?--iron started   Arthritis    Chemotherapy-induced neuropathy ([place]) 2019   Cough variant asthma    DDD (degenerative disc disease), lumbar    Diabetes mellitus with complication ([place])    Mild nonprolif DR R eye 12/2017-->referred to retinal specialist DM MANAGED BY DR. GHERGHE AS OF 2021   Endometrial cancer ([place]) 02/2018   REMISSION AS OF 08/2018.  Stage III (T3, Nx, Mx)  Path: endometroid adenocarcinoma involving cervix, uterus, and fallopian tubes (Pt got TAH & BSO 02/2018). No sign of recurrence as of onc f/u 05/2019.   GERD (gastroesophageal reflux disease)    Hemorrhoids    Herpes zoster 10/2015   L side belt-line   History of adenomatous polyp of colon 02/25/2013   5 mm cecal polyp removed by Dr. Trevor Mace 5 yrs   History of blood transfusion    History of cellulitis 08/2010   Left (Since replacemnt of Left knee   History of radiation therapy 05/21/2018   [facility] brachytherapy 04/24/2018-05/21/2018  Dr Gery Pray   Hyperkalemia 03/2018   started after pt started getting chemo-->had to stop enalapril.   Hyperlipidemia    Not on statin b/c lipids "stayed down when sugars came down" per pt.  She says Dr. Chalmers Cater  knows she is not on statin anymore.   Hypertension    Hypothyroidism    Nephrolithiasis 07/2005   Obesity    OSA on CPAP    Osteoarthritis    knees, ankle, + ? scapholunate ligament disruption (x-ray 06/2017)--ortho referral.   Pancytopenia due to antineoplastic chemotherapy ([place])    progressive as of 06/2018. Stable 08/2018.   Recurrent UTI    started cipro 250 qd 09/2018   Sciatica of left side    Severe persistent asthma    cough-variant--saw Allergist 05/25/16 and was switched from max dose advair to symbicort.    Systolic murmur    ECHO fine 10/2016-->murmur likely flow murmur assoc with HTN.   Zoon's vulvitis    Bx-proven (GYN) -lichen sclerosis.  Clobetasol 0.05% ointment per GYN    Past Surgical History:  Procedure Laterality Date   ANKLE FRACTURE SURGERY  1984   Pin & repair   ARTHROSCOPIC REPAIR ACL  2000   Due to ACL tear   ARTHROSCOPIC REPAIR ACL  5/08   Blateral knee rerlacements x4 2 times each knee     BREAST BIOPSY Right 2014   Benign   CARDIAC CATHETERIZATION  5/07   clear vessel mild mitral    CARPAL TUNNEL RELEASE Right [id number]   1989 Left   CESAREAN SECTION  1985   CHOLECYSTECTOMY OPEN  1988   COLONOSCOPY N/A 02/25/2013   Tubular adenoma x 1: Recall 5 yrs. Procedure: COLONOSCOPY;  Surgeon: Juanita Craver, MD;  Location: [facility];  Service: Endoscopy;  Laterality: N/A;   COMBINED HYSTEROSCOPY DIAGNOSTIC / D&C  2/12   Bx neg   DEXA  08/2007; 05/12/20   Bone density normal 2009 and 2022.  Plan rpt 2024.   DILATION AND CURETTAGE OF UTERUS  12/11   IR IMAGING GUIDED PORT INSERTION  03/16/2018   IR REMOVAL TUN ACCESS W/ PORT W/O FL MOD SED  09/20/2018   LYMPH NODE BIOPSY N/A 02/19/2018   Procedure: Sent[place] LYMPH NODE BIOPSY;  Surgeon: Everitt Amber, MD;  Location: [facility];  Service: Gynecology;  Laterality: N/A;   REPLACEMENT TOTAL KNEE Left 5/12   X 2 on each   ROBOTIC ASSISTED TOTAL HYSTERECTOMY WITH BILATERAL SALPINGO OOPHERECTOMY N/A 02/19/2018   For endometrial cancer.  Procedure: XI ROBOTIC ASSISTED TOTAL HYSTERECTOMY WITH BILATERAL SALPINGO OOPHORECTOMY;  Surgeon: Everitt Amber, MD;  Location: [facility];  Service: Gynecology;  Laterality: N/A;   TRANSTHORACIC ECHOCARDIOGRAM  10/11/2016    EF 55-60%, grd I DD.   TUBAL LIGATION  1985   C-Section    Outpatient Medications Prior to Visit  Medication Sig Dispense Refill   albuterol (PROVENTIL) (2.5 MG/3ML) 0.083% nebulizer solution Take 3 mLs (2.5 mg total) by nebulization every 4 (four)  hours as needed for wheezing or shortness of breath (Dx: J45.50). 75 mL 1   albuterol (VENTOLIN HFA) 108 (90 Base) MCG/ACT inhaler INHALE 2 PUFFS INTO LUNGS EVERY 6 HOURS AS NEEDED FOR WHEEZING/SHORTNESS OF BREATH 18 each 1   amitriptyline (ELAVIL) 25 MG tablet TAKE 2 TABLETS BY MOUTH EVERY NIGHT AT BEDTIME 60 tablet 0   clobetasol ointment (TEMOVATE) 9.37 % Apply 1 application topically at bedtime. Apply to the skin of the vulva for 12 weeks 30 g 2   Continuous Blood Gluc Receiver (FREESTYLE LIBRE 14 DAY READER) DEVI by Does not apply route.     diclofenac Sodium (VOLTAREN) 1 % GEL Apply 2 g topically 4 (four) times daily. 100 g 3  diphenoxylate-atropine (LOMOTIL) 2.5-0.025 MG tablet 1-2 tabs po qid prn diarrhea 30 tablet 2   Ferrous Sulfate (IRON PO) Take by mouth.     fluticasone (CUTIVATE) 0.05 % cream APPLY TO AFFECTED AREA OF R LOWER LEG TWICE PER DAY. 60 g 3   fluticasone (VERAMYST) 27.5 MCG/SPRAY nasal spray Place 1 spray into the nose daily as needed for rhinitis.     insulin NPH Human (NOVOLIN N) 100 UNIT/ML injection INJECT 30 UNITS UNDER THE SKIN 3 TIMES A DAY 30 mL 6   Insulin Syringes, Disposable, U-100 0.3 ML MISC Use to inject insulin--6 injections per day 540 each 3   levocetirizine (XYZAL) 5 MG tablet Take 5 mg by mouth at bedtime as needed for allergies.   5   metFORMIN (GLUCOPHAGE) 500 MG tablet TAKE 1 TABLET BY MOUTH EVERY MORNING AND 2 TABLETS BY MOUTH AT BEDTIME. 270 tablet 0   miconazole (MICOTIN) 2 % powder Apply 1 application topically 2 (two) times daily as needed for itching.     NOVOLOG 100 UNIT/ML injection INJECT 20-25 UNITS INTO THE SKIN 3 TIMES A DAY BEFORE MEALS 30 mL 5   Probiotic Product (PROBIOTIC PO) Take 1 capsule by mouth daily.     VITAMIN D PO Take by mouth.     HYDROcodone-acetaminophen (NORCO) 10-325 MG tablet 1 tab po bid 45 tablet 0   felodipine (PLENDIL) 10 MG 24 hr tablet Take 1 tablet (10 mg total) by mouth daily. 30 tablet 0   furosemide (LASIX)  20 MG tablet TAKE 1 TABLET BY MOUTH EVERY DAY 30 tablet 0   levothyroxine (SYNTHROID) 125 MCG tablet TAKE 1 TABLET BY MOUTH EVERY DAY 90 tablet 0   lisinopril (ZESTRIL) 10 MG tablet Take 1 tablet (10 mg total) by mouth daily. 30 tablet 0   pravastatin (PRAVACHOL) 40 MG tablet TAKE 1 TABLET (40 MG TOTAL) BY MOUTH DAILY. OFFICE VISIT NEEDED FOR FURTHER REFILLS 90 tablet 0   prochlorperazine (COMPAZINE) 10 MG tablet Take 1 tablet (10 mg total) by mouth every 6 (six) hours as needed (Nausea or vomiting). 30 tablet 1   SYMBICORT 160-4.5 MCG/ACT inhaler INHALE 2 PUFFS BY MOUTH TWICE A DAY 30.6 each 3   No facility-administered medications prior to visit.    Allergies  Allergen Reactions   Adhesive [Tape] Other (See Comments)    Takes patient's skin off.   Banana Diarrhea and Nausea Only   Eggs Or Egg-Derived Products Diarrhea and Nausea Only   Latex Other (See Comments)    sensativity   Linzess [Linaclotide] Diarrhea   Ozempic (0.25 Or 0.5 Mg-Dose) [Semaglutide(0.25 Or 0.'5mg'$ -Dos)] Diarrhea   Penicillins Rash and Other (See Comments)    Has had cephalosporins without incident   Pine Swelling and Rash   Rose Swelling and Rash    ROS As per HPI  PE:    09/02/2021    1:29 PM 08/18/2021    1:09 PM 07/26/2021    1:36 PM  Vitals with BMI  Height '5\' 1"'$  '5\' 1"'$  '5\' 1"'$   Weight 250 lbs 10 oz 252 lbs 250 lbs 13 oz  BMI 47.37 27.74 12.87  Systolic 867 672 094  Diastolic 68 62 58  Pulse 93 102 107     Physical Exam  Gen: Alert, well appearing.  Patient is oriented to person, place, time, and situation. AFFECT: pleasant, lucid thought and speech. CV: RRR, no m/r/g.   LUNGS: CTA bilat, nonlabored resps, good aeration in all lung fields. EXT: no clubbing or  cyanosis.  1+ bilat LE pitting edema and mild nonpitting lymphedema. HANDS: Mild discomfort around the base of first carpometacarpal joint and first MCP.  Not as much tenderness over the distal radius region.  Phalen's positive on the  right.  Tinel's equivocal at the elbow and wrist.  LABS:  Last CBC Lab Results  Component Value Date   WBC 8.9 02/02/2021   HGB 11.5 (L) 02/02/2021   HCT 34.9 (L) 02/02/2021   MCV 94.4 02/02/2021   MCH 34.6 (H) 09/20/2018   RDW 15.7 (H) 02/02/2021   PLT 224.0 02/02/2021   Lab Results  Component Value Date   IRON 49 02/02/2021   TIBC 364 02/02/2021   FERRITIN 31 37/90/2409   Last metabolic panel Lab Results  Component Value Date   GLUCOSE 55 (L) 03/01/2021   NA 139 03/01/2021   K 3.6 03/01/2021   CL 98 03/01/2021   CO2 31 03/01/2021   BUN 27 (H) 03/01/2021   CREATININE 0.87 03/01/2021   GFRNONAA >60 06/16/2020   CALCIUM 9.4 03/01/2021   PROT 7.4 10/28/2020   ALBUMIN 3.9 10/28/2020   BILITOT 0.2 10/28/2020   ALKPHOS 70 10/28/2020   AST 15 10/28/2020   ALT 16 10/28/2020   ANIONGAP 10 06/16/2020   Last lipids Lab Results  Component Value Date   CHOL 109 02/02/2021   HDL 37.80 (L) 02/02/2021   LDLCALC 41 02/02/2021   LDLDIRECT 78.0 10/05/2018   TRIG 150.0 (H) 02/02/2021   CHOLHDL 3 02/02/2021   Last hemoglobin A1c Lab Results  Component Value Date   HGBA1C 6.4 (A) 07/26/2021   Last thyroid functions Lab Results  Component Value Date   TSH 0.76 02/02/2021   IMPRESSION AND PLAN:  #1 hypertension, continue lisinopril 10 mg a day and felodipine 10 mg a day. Electrolytes and creatinine today.  2.  Bilateral lower extremity edema.  She has venous insufficiency. This is stable on Lasix 40 mg a day.  Low-sodium diet, compression hose. Electrolytes and creatinine today.  3.  Chronic pain syndrome.  This is mixture of osteoarthritis and chronic neuropathic pain. osteoarthritis in hands, low back, feet, L hip, knees, wrists+chemotherapy -induced neuropathy (burning and numbness in feet).  She has symptoms also suggestive of some compressive neuropathy in both hands. Takes 1-2 hydrocodone tabs a day typically. 1 tab bid prn, #45 rx'd today. Continue  amitriptyline 50 mg nightly. (Bedside ultrasound right wrist today--> osteoarthritic changes first CMC and MCP.  Median nerve normal.  Tendons unremarkable).  #4 asthma.  Stable on Symbicort 160-4.52 puffs twice daily.  5.  Type 2 diabetes, now very well controlled, managed/followed by endocrinology.  An After Visit Summary was printed and given to the patient.  FOLLOW UP: Return in about 3 months (around 12/03/2021) for routine chronic illness f/u.  Signed:  Crissie Sickles, MD           09/02/2021

## 2021-09-03 ENCOUNTER — Telehealth: Payer: Self-pay

## 2021-09-03 DIAGNOSIS — R7989 Other specified abnormal findings of blood chemistry: Secondary | ICD-10-CM

## 2021-09-03 NOTE — Telephone Encounter (Signed)
-----   Message from Tammi Sou, MD sent at 09/03/2021  9:54 AM EDT ----- (Pt on low dose ACE-I, also metformin).  Labs great except it looks like she is not hydrating well enough. Increase fluid intake to 65 oz/day and plan recheck non-fasting bmet 7-10d, dx elevated serum creatinine. No med changes at this time.

## 2021-09-10 ENCOUNTER — Ambulatory Visit (INDEPENDENT_AMBULATORY_CARE_PROVIDER_SITE_OTHER): Payer: Medicare Other

## 2021-09-10 DIAGNOSIS — R7989 Other specified abnormal findings of blood chemistry: Secondary | ICD-10-CM | POA: Diagnosis not present

## 2021-09-10 LAB — BASIC METABOLIC PANEL
BUN: 41 mg/dL — ABNORMAL HIGH (ref 6–23)
CO2: 27 mEq/L (ref 19–32)
Calcium: 9.3 mg/dL (ref 8.4–10.5)
Chloride: 103 mEq/L (ref 96–112)
Creatinine, Ser: 0.96 mg/dL (ref 0.40–1.20)
GFR: 59.86 mL/min — ABNORMAL LOW (ref >60.00)
Glucose, Bld: 73 mg/dL (ref 70–99)
Potassium: 4.5 mEq/L (ref 3.5–5.1)
Sodium: 138 mEq/L (ref 135–145)

## 2021-09-14 ENCOUNTER — Other Ambulatory Visit: Payer: Self-pay | Admitting: Family Medicine

## 2021-09-14 ENCOUNTER — Other Ambulatory Visit: Payer: Self-pay | Admitting: Internal Medicine

## 2021-10-08 ENCOUNTER — Ambulatory Visit (INDEPENDENT_AMBULATORY_CARE_PROVIDER_SITE_OTHER): Payer: Medicare Other | Admitting: Family Medicine

## 2021-10-08 ENCOUNTER — Encounter: Payer: Self-pay | Admitting: Family Medicine

## 2021-10-08 VITALS — BP 132/74 | HR 104 | Temp 98.4°F | Ht 61.0 in | Wt 256.8 lb

## 2021-10-08 DIAGNOSIS — M25532 Pain in left wrist: Secondary | ICD-10-CM

## 2021-10-08 DIAGNOSIS — S63502A Unspecified sprain of left wrist, initial encounter: Secondary | ICD-10-CM | POA: Diagnosis not present

## 2021-10-08 MED ORDER — MELOXICAM 15 MG PO TABS: 15.0000 mg | ORAL_TABLET | Freq: Every day | ORAL | 0 refills | Status: DC

## 2021-10-08 NOTE — Progress Notes (Signed)
OFFICE VISIT  10/08/2021  CC:  Chief Complaint  Patient presents with   Hand Pain    Left;  2 days, has tried RICE method and using Advil    Patient is a 71 y.o. female who presents for left hand pain.  HPI: Onset 2 days ago, moderate to severe pain in the left wrist. She does recall that she was laying on the couch napping and remembers rolling over and putting her hand down to catch herself from falling off the couch.  That evening as when it began to hurt.  Constant throb/ache. Took ibuprofen 400 mg last night and it did help some Hurts with all range of motion and it feels limited in range of motion.  No redness.  She does feel like it swollen.  She was not having any left wrist or hand symptoms leading up to this.  Past Medical History:  Diagnosis Date   Allergic rhinitis    Allergy testing: results pending as of 05/28/16 (Dr. Harold Hedge)   Anemia 10/2020   anemia of chronic dz (? + mild IDA?--iron started   Arthritis    Chemotherapy-induced neuropathy ([place]) 2019   Cough variant asthma    DDD (degenerative disc disease), lumbar    Diabetes mellitus with complication ([place])    Mild nonprolif DR R eye 12/2017-->referred to retinal specialist DM MANAGED BY DR. GHERGHE AS OF 2021   Endometrial cancer ([place]) 02/2018   REMISSION AS OF 08/2018.  Stage III (T3, Nx, Mx)  Path: endometroid adenocarcinoma involving cervix, uterus, and fallopian tubes (Pt got TAH & BSO 02/2018). No sign of recurrence as of onc f/u 05/2019.   GERD (gastroesophageal reflux disease)    Hemorrhoids    Herpes zoster 10/2015   L side belt-line   History of adenomatous polyp of colon 02/25/2013   5 mm cecal polyp removed by Dr. Trevor Mace 5 yrs   History of blood transfusion    History of cellulitis 08/2010   Left (Since replacemnt of Left knee   History of radiation therapy 05/21/2018   [facility] brachytherapy 04/24/2018-05/21/2018  Dr Gery Pray   Hyperkalemia 03/2018   started after pt started getting  chemo-->had to stop enalapril.   Hyperlipidemia    Not on statin b/c lipids "stayed down when sugars came down" per pt.  She says Dr. Chalmers Cater knows she is not on statin anymore.   Hypertension    Hypothyroidism    Nephrolithiasis 07/2005   Obesity    OSA on CPAP    Osteoarthritis    knees, ankle, + ? scapholunate ligament disruption (x-ray 06/2017)--ortho referral.   Pancytopenia due to antineoplastic chemotherapy ([place])    progressive as of 06/2018. Stable 08/2018.   Recurrent UTI    started cipro 250 qd 09/2018   Sciatica of left side    Severe persistent asthma    cough-variant--saw Allergist 05/25/16 and was switched from max dose advair to symbicort.   Systolic murmur    ECHO fine 10/2016-->murmur likely flow murmur assoc with HTN.   Zoon's vulvitis    Bx-proven (GYN) -lichen sclerosis.  Clobetasol 0.05% ointment per GYN    Past Surgical History:  Procedure Laterality Date   ANKLE FRACTURE SURGERY  1984   Pin & repair   ARTHROSCOPIC REPAIR ACL  2000   Due to ACL tear   ARTHROSCOPIC REPAIR ACL  5/08   Blateral knee rerlacements x4 2 times each knee     BREAST BIOPSY Right 2014   Benign  CARDIAC CATHETERIZATION  5/07   clear vessel mild mitral    CARPAL TUNNEL RELEASE Right [id number]   1989 Left   CESAREAN SECTION  1985   CHOLECYSTECTOMY OPEN  1988   COLONOSCOPY N/A 02/25/2013   Tubular adenoma x 1: Recall 5 yrs. Procedure: COLONOSCOPY;  Surgeon: Juanita Craver, MD;  Location: [facility];  Service: Endoscopy;  Laterality: N/A;   COMBINED HYSTEROSCOPY DIAGNOSTIC / D&C  2/12   Bx neg   DEXA  08/2007; 05/12/20   Bone density normal 2009 and 2022.  Plan rpt 2024.   DILATION AND CURETTAGE OF UTERUS  12/11   IR IMAGING GUIDED PORT INSERTION  03/16/2018   IR REMOVAL TUN ACCESS W/ PORT W/O FL MOD SED  09/20/2018   LYMPH NODE BIOPSY N/A 02/19/2018   Procedure: Sentinel LYMPH NODE BIOPSY;  Surgeon: Everitt Amber, MD;  Location: [facility];  Service: Gynecology;  Laterality:  N/A;   REPLACEMENT TOTAL KNEE Left 5/12   X 2 on each   ROBOTIC ASSISTED TOTAL HYSTERECTOMY WITH BILATERAL SALPINGO OOPHERECTOMY N/A 02/19/2018   For endometrial cancer.  Procedure: XI ROBOTIC ASSISTED TOTAL HYSTERECTOMY WITH BILATERAL SALPINGO OOPHORECTOMY;  Surgeon: Everitt Amber, MD;  Location: [place];  Service: Gynecology;  Laterality: N/A;   TRANSTHORACIC ECHOCARDIOGRAM  10/11/2016    EF 55-60%, grd I DD.   TUBAL LIGATION  1985   C-Section    Outpatient Medications Prior to Visit  Medication Sig Dispense Refill   albuterol (PROVENTIL) (2.5 MG/3ML) 0.083% nebulizer solution Take 3 mLs (2.5 mg total) by nebulization every 4 (four) hours as needed for wheezing or shortness of breath (Dx: J45.50). 75 mL 1   albuterol (VENTOLIN HFA) 108 (90 Base) MCG/ACT inhaler INHALE 2 PUFFS INTO LUNGS EVERY 6 HOURS AS NEEDED FOR WHEEZING/SHORTNESS OF BREATH 18 each 1   amitriptyline (ELAVIL) 25 MG tablet TAKE 2 TABLETS BY MOUTH EVERY NIGHT AT BEDTIME 180 tablet 0   clobetasol ointment (TEMOVATE) 4.43 % Apply 1 application topically at bedtime. Apply to the skin of the vulva for 12 weeks 30 g 2   Continuous Blood Gluc Receiver (FREESTYLE LIBRE 14 DAY READER) DEVI by Does not apply route.     diclofenac Sodium (VOLTAREN) 1 % GEL Apply 2 g topically 4 (four) times daily. 100 g 3   diphenoxylate-atropine (LOMOTIL) 2.5-0.025 MG tablet 1-2 tabs po qid prn diarrhea 30 tablet 2   felodipine (PLENDIL) 10 MG 24 hr tablet Take 1 tablet (10 mg total) by mouth daily. 90 tablet 1   Ferrous Sulfate (IRON PO) Take by mouth.     fluticasone (CUTIVATE) 0.05 % cream APPLY TO AFFECTED AREA OF R LOWER LEG TWICE PER DAY. 60 g 3   fluticasone (VERAMYST) 27.5 MCG/SPRAY nasal spray Place 1 spray into the nose daily as needed for rhinitis.     furosemide (LASIX) 20 MG tablet Take 1 tablet (20 mg total) by mouth daily. 90 tablet 1   HYDROcodone-acetaminophen (NORCO) 10-325 MG tablet 1 tab po bid 45 tablet 0    insulin NPH Human (NOVOLIN N) 100 UNIT/ML injection INJECT 30 UNITS UNDER THE SKIN 3 TIMES A DAY 30 mL 6   Insulin Syringes, Disposable, U-100 0.3 ML MISC Use to inject insulin--6 injections per day 540 each 3   levocetirizine (XYZAL) 5 MG tablet Take 5 mg by mouth at bedtime as needed for allergies.   5   levothyroxine (SYNTHROID) 125 MCG tablet Take 1 tablet (125 mcg total) by mouth  daily. 90 tablet 1   lisinopril (ZESTRIL) 10 MG tablet Take 1 tablet (10 mg total) by mouth daily. 90 tablet 1   metFORMIN (GLUCOPHAGE) 500 MG tablet TAKE 1 TABLET BY MOUTH EVERY MORNING AND 2 TABLETS BY MOUTH AT BEDTIME. 270 tablet 0   miconazole (MICOTIN) 2 % powder Apply 1 application topically 2 (two) times daily as needed for itching.     NOVOLOG 100 UNIT/ML injection INJECT 20-25 UNITS INTO THE SKIN 3 TIMES A DAY BEFORE MEALS 30 mL 5   pravastatin (PRAVACHOL) 40 MG tablet Take 1 tablet (40 mg total) by mouth daily. TAKE 1 TABLET (40 MG TOTAL) BY MOUTH DAILY. 90 tablet 1   Probiotic Product (PROBIOTIC PO) Take 1 capsule by mouth daily.     SYMBICORT 160-4.5 MCG/ACT inhaler INHALE 2 PUFFS BY MOUTH TWICE A DAY 30.6 each 3   VITAMIN D PO Take by mouth.     No facility-administered medications prior to visit.    Allergies  Allergen Reactions   Adhesive [Tape] Other (See Comments)    Takes patient's skin off.   Banana Diarrhea and Nausea Only   Eggs Or Egg-Derived Products Diarrhea and Nausea Only   Latex Other (See Comments)    sensativity   Linzess [Linaclotide] Diarrhea   Ozempic (0.25 Or 0.5 Mg-Dose) [Semaglutide(0.25 Or 0.'5mg'$ -Dos)] Diarrhea   Penicillins Rash and Other (See Comments)    Has had cephalosporins without incident   Pine Swelling and Rash   Rose Swelling and Rash    ROS As per HPI  PE:    10/08/2021    4:03 PM 09/02/2021    1:29 PM 08/18/2021    1:09 PM  Vitals with BMI  Height '5\' 1"'$  '5\' 1"'$  '5\' 1"'$   Weight 256 lbs 13 oz 250 lbs 10 oz 252 lbs  BMI 48.55 22.97 98.92  Systolic 119  417 408  Diastolic 74 68 62  Pulse 144 93 102   Physical Exam  Gen: Alert, well appearing.  Patient is oriented to person, place, time, and situation. AFFECT: pleasant, lucid thought and speech. Left wrist is without erythema or obvious swelling on inspection.  No erythema or warmth.   Significant tenderness to palpation noted over the wrist, dorsal greater than volar.  Radial greater than ulnar Definite worsening of pain with range of motion of the wrist as well as range of motion of the thumb.  She can slowly make a grip/grasp. Sensation normal.  Pulses normal.  LABS:  Last CBC Lab Results  Component Value Date   WBC 8.9 02/02/2021   HGB 11.5 (L) 02/02/2021   HCT 34.9 (L) 02/02/2021   MCV 94.4 02/02/2021   MCH 34.6 (H) 09/20/2018   RDW 15.7 (H) 02/02/2021   PLT 224.0 81/85/6314   Last metabolic panel Lab Results  Component Value Date   GLUCOSE 73 09/10/2021   NA 138 09/10/2021   K 4.5 09/10/2021   CL 103 09/10/2021   CO2 27 09/10/2021   BUN 41 (H) 09/10/2021   CREATININE 0.96 09/10/2021   GFRNONAA >60 06/16/2020   CALCIUM 9.3 09/10/2021   PROT 7.1 09/02/2021   ALBUMIN 3.8 09/02/2021   BILITOT 0.2 09/02/2021   ALKPHOS 58 09/02/2021   AST 18 09/02/2021   ALT 16 09/02/2021   ANIONGAP 10 06/16/2020   Lab Results  Component Value Date   HGBA1C 6.4 (A) 07/26/2021   IMPRESSION AND PLAN:  Acute left wrist pain. I will treat this as more of a sprain than  tenosynovitis. Lower suspicion of inflammatory or gouty arthritis or infection.  Low suspicion of fracture given the mechanism of injury but if not improving in the next few days will get x-ray.  Bedside ultrasound today: Wrist extensor and flexor compartments unremarkable.  No effusion.  Meloxicam 15 mg a day. Ice to affected area for 20 minutes 3 times a day. Thumb spica splint applied today.  An After Visit Summary was printed and given to the patient.  FOLLOW UP: Return in about 1 week (around 10/15/2021) for  f/u wrist/thumb pain.  Signed:  Crissie Sickles, MD           10/08/2021

## 2021-10-11 ENCOUNTER — Telehealth: Payer: Self-pay

## 2021-10-11 NOTE — Telephone Encounter (Signed)
Inbound call from pharmacy requesting recent clinical notes be faxed to [phone]. Notes routed via Epic.

## 2021-10-15 ENCOUNTER — Ambulatory Visit: Payer: Medicare Other | Admitting: Family Medicine

## 2021-10-21 ENCOUNTER — Encounter: Payer: Self-pay | Admitting: Family Medicine

## 2021-10-21 ENCOUNTER — Ambulatory Visit (INDEPENDENT_AMBULATORY_CARE_PROVIDER_SITE_OTHER): Payer: Medicare Other | Admitting: Family Medicine

## 2021-10-21 VITALS — BP 130/66 | HR 97 | Temp 98.1°F | Ht 61.0 in | Wt 255.8 lb

## 2021-10-21 DIAGNOSIS — M654 Radial styloid tenosynovitis [de Quervain]: Secondary | ICD-10-CM

## 2021-10-21 DIAGNOSIS — M25532 Pain in left wrist: Secondary | ICD-10-CM | POA: Diagnosis not present

## 2021-10-21 DIAGNOSIS — M1812 Unilateral primary osteoarthritis of first carpometacarpal joint, left hand: Secondary | ICD-10-CM

## 2021-10-21 MED ORDER — MELOXICAM 15 MG PO TABS: ORAL_TABLET | ORAL | 1 refills | Status: DC

## 2021-10-21 NOTE — Progress Notes (Signed)
OFFICE VISIT  10/21/2021  CC:  Chief Complaint  Patient presents with   Wrist Pain    Pt states the brace has helped a lot   Patient is a 71 y.o. female who presents for 2-week follow-up left wrist pain. A/P as of last visit: "Acute left wrist pain. I will treat this as more of a sprain than tenosynovitis. Lower suspicion of inflammatory or gouty arthritis or infection.  Low suspicion of fracture given the mechanism of injury but if not improving in the next few days will get x-ray.   Bedside ultrasound today: Wrist extensor and flexor compartments unremarkable.  No effusion.   Meloxicam 15 mg a day. Ice to affected area for 20 minutes 3 times a day. Thumb spica splint applied today.  INTERIM HX: Her left wrist feels significantly improved.  Still having some pain but says the brace and meloxicam have helped.     Past Medical History:  Diagnosis Date   Allergic rhinitis    Allergy testing: results pending as of 05/28/16 (Dr. Harold Hedge)   Anemia 10/2020   anemia of chronic dz (? + mild IDA?--iron started   Arthritis    Chemotherapy-induced neuropathy ([place]) 2019   Cough variant asthma    DDD (degenerative disc disease), lumbar    Diabetes mellitus with complication ([place])    Mild nonprolif DR R eye 12/2017-->referred to retinal specialist DM MANAGED BY DR. GHERGHE AS OF 2021   Endometrial cancer ([place]) 02/2018   REMISSION AS OF 08/2018.  Stage III (T3, Nx, Mx)  Path: endometroid adenocarcinoma involving cervix, uterus, and fallopian tubes (Pt got TAH & BSO 02/2018). No sign of recurrence as of onc f/u 05/2019.   GERD (gastroesophageal reflux disease)    Hemorrhoids    Herpes zoster 10/2015   L side belt-line   History of adenomatous polyp of colon 02/25/2013   5 mm cecal polyp removed by Dr. Trevor Mace 5 yrs   History of blood transfusion    History of cellulitis 08/2010   Left (Since replacemnt of Left knee   History of radiation therapy 05/21/2018   [facility]  brachytherapy 04/24/2018-05/21/2018  Dr Gery Pray   Hyperkalemia 03/2018   started after pt started getting chemo-->had to stop enalapril.   Hyperlipidemia    Not on statin b/c lipids "stayed down when sugars came down" per pt.  She says Dr. Chalmers Cater knows she is not on statin anymore.   Hypertension    Hypothyroidism    Nephrolithiasis 07/2005   Obesity    OSA on CPAP    Osteoarthritis    knees, ankle, + ? scapholunate ligament disruption (x-ray 06/2017)--ortho referral.   Pancytopenia due to antineoplastic chemotherapy ([place])    progressive as of 06/2018. Stable 08/2018.   Recurrent UTI    started cipro 250 qd 09/2018   Sciatica of left side    Severe persistent asthma    cough-variant--saw Allergist 05/25/16 and was switched from max dose advair to symbicort.   Systolic murmur    ECHO fine 10/2016-->murmur likely flow murmur assoc with HTN.   Zoon's vulvitis    Bx-proven (GYN) -lichen sclerosis.  Clobetasol 0.05% ointment per GYN    Past Surgical History:  Procedure Laterality Date   ANKLE FRACTURE SURGERY  1984   Pin & repair   ARTHROSCOPIC REPAIR ACL  2000   Due to ACL tear   ARTHROSCOPIC REPAIR ACL  5/08   Blateral knee rerlacements x4 2 times each knee  BREAST BIOPSY Right 2014   Benign   CARDIAC CATHETERIZATION  5/07   clear vessel mild mitral    CARPAL TUNNEL RELEASE Right [id number]   1989 Left   CESAREAN SECTION  1985   CHOLECYSTECTOMY OPEN  1988   COLONOSCOPY N/A 02/25/2013   Tubular adenoma x 1: Recall 5 yrs. Procedure: COLONOSCOPY;  Surgeon: Juanita Craver, MD;  Location: [facility];  Service: Endoscopy;  Laterality: N/A;   COMBINED HYSTEROSCOPY DIAGNOSTIC / D&C  2/12   Bx neg   DEXA  08/2007; 05/12/20   Bone density normal 2009 and 2022.  Plan rpt 2024.   DILATION AND CURETTAGE OF UTERUS  12/11   IR IMAGING GUIDED PORT INSERTION  03/16/2018   IR REMOVAL TUN ACCESS W/ PORT W/O FL MOD SED  09/20/2018   LYMPH NODE BIOPSY N/A 02/19/2018   Procedure: Sentinel LYMPH  NODE BIOPSY;  Surgeon: Everitt Amber, MD;  [facility];  Service: Gynecology;  Laterality: N/A;   REPLACEMENT TOTAL KNEE Left 5/12   X 2 on each   ROBOTIC ASSISTED TOTAL HYSTERECTOMY WITH BILATERAL SALPINGO OOPHERECTOMY N/A 02/19/2018   For endometrial cancer.  Procedure: XI ROBOTIC ASSISTED TOTAL HYSTERECTOMY WITH BILATERAL SALPINGO OOPHORECTOMY;  Surgeon: Everitt Amber, MD;  Location: [place];  Service: Gynecology;  Laterality: N/A;   TRANSTHORACIC ECHOCARDIOGRAM  10/11/2016    EF 55-60%, grd I DD.   TUBAL LIGATION  1985   C-Section    Outpatient Medications Prior to Visit  Medication Sig Dispense Refill   albuterol (PROVENTIL) (2.5 MG/3ML) 0.083% nebulizer solution Take 3 mLs (2.5 mg total) by nebulization every 4 (four) hours as needed for wheezing or shortness of breath (Dx: J45.50). 75 mL 1   albuterol (VENTOLIN HFA) 108 (90 Base) MCG/ACT inhaler INHALE 2 PUFFS INTO LUNGS EVERY 6 HOURS AS NEEDED FOR WHEEZING/SHORTNESS OF BREATH 18 each 1   amitriptyline (ELAVIL) 25 MG tablet TAKE 2 TABLETS BY MOUTH EVERY NIGHT AT BEDTIME 180 tablet 0   Apoaequorin (PREVAGEN PO) Take by mouth daily.     clobetasol ointment (TEMOVATE) 7.42 % Apply 1 application topically at bedtime. Apply to the skin of the vulva for 12 weeks 30 g 2   Continuous Blood Gluc Receiver (FREESTYLE LIBRE 14 DAY READER) DEVI by Does not apply route.     diclofenac Sodium (VOLTAREN) 1 % GEL Apply 2 g topically 4 (four) times daily. 100 g 3   diphenoxylate-atropine (LOMOTIL) 2.5-0.025 MG tablet 1-2 tabs po qid prn diarrhea 30 tablet 2   felodipine (PLENDIL) 10 MG 24 hr tablet Take 1 tablet (10 mg total) by mouth daily. 90 tablet 1   Ferrous Sulfate (IRON PO) Take by mouth.     fluticasone (CUTIVATE) 0.05 % cream APPLY TO AFFECTED AREA OF R LOWER LEG TWICE PER DAY. 60 g 3   fluticasone (VERAMYST) 27.5 MCG/SPRAY nasal spray Place 1 spray into the nose daily as needed for rhinitis.      furosemide (LASIX) 20 MG tablet Take 1 tablet (20 mg total) by mouth daily. 90 tablet 1   HYDROcodone-acetaminophen (NORCO) 10-325 MG tablet 1 tab po bid 45 tablet 0   insulin NPH Human (NOVOLIN N) 100 UNIT/ML injection INJECT 30 UNITS UNDER THE SKIN 3 TIMES A DAY 30 mL 6   Insulin Syringes, Disposable, U-100 0.3 ML MISC Use to inject insulin--6 injections per day 540 each 3   levocetirizine (XYZAL) 5 MG tablet Take 5 mg by mouth at bedtime as needed  for allergies.   5   levothyroxine (SYNTHROID) 125 MCG tablet Take 1 tablet (125 mcg total) by mouth daily. 90 tablet 1   lisinopril (ZESTRIL) 10 MG tablet Take 1 tablet (10 mg total) by mouth daily. 90 tablet 1   metFORMIN (GLUCOPHAGE) 500 MG tablet TAKE 1 TABLET BY MOUTH EVERY MORNING AND 2 TABLETS BY MOUTH AT BEDTIME. 270 tablet 0   miconazole (MICOTIN) 2 % powder Apply 1 application topically 2 (two) times daily as needed for itching.     NOVOLOG 100 UNIT/ML injection INJECT 20-25 UNITS INTO THE SKIN 3 TIMES A DAY BEFORE MEALS 30 mL 5   pravastatin (PRAVACHOL) 40 MG tablet Take 1 tablet (40 mg total) by mouth daily. TAKE 1 TABLET (40 MG TOTAL) BY MOUTH DAILY. 90 tablet 1   Probiotic Product (PROBIOTIC PO) Take 1 capsule by mouth daily.     SYMBICORT 160-4.5 MCG/ACT inhaler INHALE 2 PUFFS BY MOUTH TWICE A DAY 30.6 each 3   VITAMIN D PO Take by mouth.     meloxicam (MOBIC) 15 MG tablet Take 1 tablet (15 mg total) by mouth daily. 10 tablet 0   No facility-administered medications prior to visit.    Allergies  Allergen Reactions   Adhesive [Tape] Other (See Comments)    Takes patient's skin off.   Banana Diarrhea and Nausea Only   Eggs Or Egg-Derived Products Diarrhea and Nausea Only   Latex Other (See Comments)    sensativity   Linzess [Linaclotide] Diarrhea   Ozempic (0.25 Or 0.5 Mg-Dose) [Semaglutide(0.25 Or 0.'5mg'$ -Dos)] Diarrhea   Penicillins Rash and Other (See Comments)    Has had cephalosporins without incident   Pine Swelling and  Rash   Rose Swelling and Rash    ROS As per HPI  PE:    10/21/2021    1:08 PM 10/08/2021    4:03 PM 09/02/2021    1:29 PM  Vitals with BMI  Height '5\' 1"'$  '5\' 1"'$  '5\' 1"'$   Weight 255 lbs 13 oz 256 lbs 13 oz 250 lbs 10 oz  BMI 48.36 98.33 82.50  Systolic 539 767 341  Diastolic 66 74 68  Pulse 97 104 93     Physical Exam  General: Alert and well-appearing. Left wrist with tenderness over the radial aspect, particularly the first extensor compartment tendons as well as first Dix joint.  Full range of motion of the wrist is present.  Finkelstein's equivocal  LABS:  Last CBC Lab Results  Component Value Date   WBC 8.9 02/02/2021   HGB 11.5 (L) 02/02/2021   HCT 34.9 (L) 02/02/2021   MCV 94.4 02/02/2021   MCH 34.6 (H) 09/20/2018   RDW 15.7 (H) 02/02/2021   PLT 224.0 93/79/0240   Last metabolic panel Lab Results  Component Value Date   GLUCOSE 73 09/10/2021   NA 138 09/10/2021   K 4.5 09/10/2021   CL 103 09/10/2021   CO2 27 09/10/2021   BUN 41 (H) 09/10/2021   CREATININE 0.96 09/10/2021   GFRNONAA >60 06/16/2020   CALCIUM 9.3 09/10/2021   PROT 7.1 09/02/2021   ALBUMIN 3.8 09/02/2021   BILITOT 0.2 09/02/2021   ALKPHOS 58 09/02/2021   AST 18 09/02/2021   ALT 16 09/02/2021   ANIONGAP 10 06/16/2020   Last hemoglobin A1c Lab Results  Component Value Date   HGBA1C 6.4 (A) 07/26/2021   IMPRESSION AND PLAN:  Left wrist pain.  Suspect combination of first Dobson joint arthritis plus some component of de Quervain's  tenosynovitis. She is improving with thumb spica splint.  We will continue this as well as refill her meloxicam to use 15 mg daily as needed.    An After Visit Summary was printed and given to the patient.  FOLLOW UP: Return if symptoms worsen or fail to improve.  Signed:  Crissie Sickles, MD           10/21/2021

## 2021-10-25 NOTE — Telephone Encounter (Signed)
Pt contacted office requesting clinical notes be faxed to [facility]. Notes routes to [phone] via Epic

## 2021-11-11 ENCOUNTER — Other Ambulatory Visit: Payer: Self-pay | Admitting: Family Medicine

## 2021-11-12 MED ORDER — HYDROCODONE-ACETAMINOPHEN 10-325 MG PO TABS: ORAL_TABLET | ORAL | 0 refills | Status: DC

## 2021-11-12 NOTE — Telephone Encounter (Signed)
Requesting: [employer] Contract: 02/02/21 UDS:01/13/21 Last Visit: 10/21/21 Next Visit: 12/06/21 Last Refill: 09/02/21(45,0)  Please Advise. Med pending

## 2021-11-16 NOTE — Telephone Encounter (Signed)
Pt advised refill sent via mychart.

## 2021-11-29 ENCOUNTER — Encounter: Payer: Self-pay | Admitting: Internal Medicine

## 2021-11-29 ENCOUNTER — Ambulatory Visit (INDEPENDENT_AMBULATORY_CARE_PROVIDER_SITE_OTHER): Payer: Medicare Other | Admitting: Internal Medicine

## 2021-11-29 VITALS — BP 128/68 | HR 111 | Ht 61.0 in | Wt 256.6 lb

## 2021-11-29 DIAGNOSIS — E1142 Type 2 diabetes mellitus with diabetic polyneuropathy: Secondary | ICD-10-CM

## 2021-11-29 DIAGNOSIS — E785 Hyperlipidemia, unspecified: Secondary | ICD-10-CM | POA: Diagnosis not present

## 2021-11-29 DIAGNOSIS — E1165 Type 2 diabetes mellitus with hyperglycemia: Secondary | ICD-10-CM | POA: Diagnosis not present

## 2021-11-29 LAB — POCT GLYCOSYLATED HEMOGLOBIN (HGB A1C): Hemoglobin A1C: 6.1 % — AB (ref 4.0–5.6)

## 2021-11-29 NOTE — Progress Notes (Signed)
ID: [name], female   DOB: 10/06/1950, 71 y.o.   MRN: [id number]   HPI: [name] is a 71 y.o.-year-old female, initially referred by her PCP, Dr. Ernestine Conrad, returning for follow-up for DM2, dx in 1992, insulin-dependent since 2004-2005, uncontrolled, with complications (DR).  Last visit 4 months ago.   On Well care.  Interim history: No increased urination, blurry vision, nausea, chest pain. July is a difficult month for her as this was the month her son died more than 30 years ago. Husband was sick  - PE, but recovered.  Reviewed HbA1c levels: Lab Results  Component Value Date   HGBA1C 6.4 (A) 07/26/2021   HGBA1C 6.6 (A) 03/08/2021   HGBA1C 6.3 (A) 09/01/2020   HGBA1C 9.1 (A) 05/15/2020   HGBA1C 7.7 (A) 12/26/2019   HGBA1C 6.6 (A) 05/07/2019   HGBA1C 8.8 (H) 01/21/2019   HGBA1C 7.5 (H) 10/05/2018   HGBA1C 7.0 (A) 05/18/2018   HGBA1C 7.1 (H) 02/15/2018   She is on: - Metformin 500 mg in am and 1000 mg with dinner  Insulin Before breakfast Before lunch Before dinner Bedtime  Novolog 28 15 >> 10-15 25 -  NPH 25 30 >> 15 35   She tried Ozempic 0.5 mg weekly >> significant diarrhea, abd. Pain, food intolerance >> started 04/2019 -stopped 07/2019  She is checking more than 4 times a day with her CGM.  She has to use skin tac to be able to tolerate the adhesive:   Previously:   Previously:   Lowest sugar was  50s >> 54 >> 54 >> 50s; she has hypoglycemia awareness in the 70s.  Highest sugar was 300 - pizza ...>> 273 >> 243 >> 300.  Glucometer: [facility]  Pt's meals are: - Breakfast: oatmeal - instant, peacans, butter >> sausage, bacon, apple sauce, velveeta crackers - Lunch: tortilla + PB and J or [place] and cheese >> yoghurt, apple sauce, half a sandwich - Dinner: meat (hamburger, chicken, bratwurst), spaghetti, green beans, mixed veggies - Snacks:   -No CKD, last BUN/creatinine:  Lab Results  Component Value Date   BUN 41 (H) 09/10/2021   BUN 50 (H) 09/02/2021    CREATININE 0.96 09/10/2021   CREATININE 1.07 09/02/2021   -+ HL; last set of lipids: Lab Results  Component Value Date   CHOL 109 02/02/2021   HDL 37.80 (L) 02/02/2021   LDLCALC 41 02/02/2021   LDLDIRECT 78.0 10/05/2018   TRIG 150.0 (H) 02/02/2021   CHOLHDL 3 02/02/2021  On pravastatin 20.  - last eye exam was on January 05, 2021: No DR  (previously:+ DR).  She had cataract surgery 02/23/2021.  -+ numbness and tingling in her feet -chemotherapy related.  Pt has FH of DM in daughter, brother, father, P aunts.  She also has a history of endometrial cancer - 2019, also, OSA, chemotherapy-induced peripheral neuropathy, hypothyroidism-on levothyroxine 125 mcg daily.  Latest TSH was normal: Lab Results  Component Value Date   TSH 0.76 02/02/2021   She lost a child at 45 y/o in 102 >> she gained 100 lbs and was dx'ed with DM afterwards.  No h/o pancreatitis or FH of MTC.  Brother died of Lewy body dementia.  ROS: + see HPI Neurological: no tremors/+ numbness/+ tingling/no dizziness  I reviewed pt's medications, allergies, PMH, social hx, family hx, and changes were documented in the history of present illness. Otherwise, unchanged from my initial visit note.  Past Medical History:  Diagnosis Date   Allergic rhinitis  Allergy testing: results pending as of 05/28/16 (Dr. Harold Hedge)   Anemia 10/2020   anemia of chronic dz (? + mild IDA?--iron started   Arthritis    Chemotherapy-induced neuropathy ([place]) 2019   Cough variant asthma    DDD (degenerative disc disease), lumbar    Diabetes mellitus with complication ([place])    Mild nonprolif DR R eye 12/2017-->referred to retinal specialist DM MANAGED BY DR. Carolynn Tuley AS OF 2021   Endometrial cancer ([place]) 02/2018   REMISSION AS OF 08/2018.  Stage III (T3, Nx, Mx)  Path: endometroid adenocarcinoma involving cervix, uterus, and fallopian tubes (Pt got TAH & BSO 02/2018). No sign of recurrence as of onc f/u 05/2019.   GERD  (gastroesophageal reflux disease)    Hemorrhoids    Herpes zoster 10/2015   L side belt-line   History of adenomatous polyp of colon 02/25/2013   5 mm cecal polyp removed by Dr. Trevor Mace 5 yrs   History of blood transfusion    History of cellulitis 08/2010   Left (Since replacemnt of Left knee   History of radiation therapy 05/21/2018   HDR [facility] brachytherapy 04/24/2018-05/21/2018  Dr Gery Pray   Hyperkalemia 03/2018   started after pt started getting chemo-->had to stop enalapril.   Hyperlipidemia    Not on statin b/c lipids "stayed down when sugars came down" per pt.  She says Dr. Chalmers Cater knows she is not on statin anymore.   Hypertension    Hypothyroidism    Nephrolithiasis 07/2005   Obesity    OSA on CPAP    Osteoarthritis    knees, ankle, + ? scapholunate ligament disruption (x-ray 06/2017)--ortho referral.   Pancytopenia due to antineoplastic chemotherapy ([place])    progressive as of 06/2018. Stable 08/2018.   Recurrent UTI    started cipro 250 qd 09/2018   Sciatica of left side    Severe persistent asthma    cough-variant--saw Allergist 05/25/16 and was switched from max dose advair to symbicort.   Systolic murmur    ECHO fine 10/2016-->murmur likely flow murmur assoc with HTN.   Zoon's vulvitis    Bx-proven (GYN) -lichen sclerosis.  Clobetasol 0.05% ointment per GYN   Past Surgical History:  Procedure Laterality Date   ANKLE FRACTURE SURGERY  1984   Pin & repair   ARTHROSCOPIC REPAIR ACL  2000   Due to ACL tear   ARTHROSCOPIC REPAIR ACL  5/08   Blateral knee rerlacements x4 2 times each knee     BREAST BIOPSY Right 2014   Benign   CARDIAC CATHETERIZATION  5/07   clear vessel mild mitral    CARPAL TUNNEL RELEASE Right [id number]   1989 Left   CESAREAN SECTION  1985   CHOLECYSTECTOMY OPEN  1988   COLONOSCOPY N/A 02/25/2013   Tubular adenoma x 1: Recall 5 yrs. Procedure: COLONOSCOPY;  Surgeon: Juanita Craver, MD;  Location: [facility];  Service: Endoscopy;  Laterality:  N/A;   COMBINED HYSTEROSCOPY DIAGNOSTIC / D&C  2/12   Bx neg   DEXA  08/2007; 05/12/20   Bone density normal 2009 and 2022.  Plan rpt 2024.   DILATION AND CURETTAGE OF UTERUS  12/11   IR IMAGING GUIDED PORT INSERTION  03/16/2018   IR REMOVAL TUN ACCESS W/ PORT W/O FL MOD SED  09/20/2018   LYMPH NODE BIOPSY N/A 02/19/2018   Procedure: Sentinel LYMPH NODE BIOPSY;  Surgeon: Everitt Amber, MD;  Location: [facility];  Service: Gynecology;  Laterality: N/A;   REPLACEMENT  TOTAL KNEE Left 5/12   X 2 on each   ROBOTIC ASSISTED TOTAL HYSTERECTOMY WITH BILATERAL SALPINGO OOPHERECTOMY N/A 02/19/2018   For endometrial cancer.  Procedure: XI ROBOTIC ASSISTED TOTAL HYSTERECTOMY WITH BILATERAL SALPINGO OOPHORECTOMY;  Surgeon: Everitt Amber, MD;  Location: [facility];  Service: Gynecology;  Laterality: N/A;   TRANSTHORACIC ECHOCARDIOGRAM  10/11/2016    EF 55-60%, grd I DD.   TUBAL LIGATION  1985   C-Section   Social History   Socioeconomic History   Marital status: Married    Spouse name: Louie Casa   Number of children: 2   Years of education: Not on file   Highest education level: Bachelor's degree (e.g., BA, AB, BS)  Occupational History   Occupation: retired Therapist, sports  Tobacco Use   Smoking status: Never   Smokeless tobacco: Never  Vaping Use   Vaping Use: Never used  Substance and Sexual Activity   Alcohol use: No    Alcohol/week: 0.0 standard drinks of alcohol   Drug use: No   Sexual activity: Never    Partners: Male    Birth control/protection: I.U.D.  Other Topics Concern   Not on file  Social History Narrative   Not on file   Social Determinants of Health   Financial Resource Strain: Low Risk  (05/05/2021)   Overall Financial Resource Strain (CARDIA)    Difficulty of Paying Living Expenses: Not hard at all  Food Insecurity: No Food Insecurity (05/05/2021)   Hunger Vital Sign    Worried About Running Out of Food in the Last Year: Never true    Ran Out of Food in  the Last Year: Never true  Transportation Needs: No Transportation Needs (05/05/2021)   PRAPARE - Hydrologist (Medical): No    Lack of Transportation (Non-Medical): No  Physical Activity: Inactive (05/05/2021)   Exercise Vital Sign    Days of Exercise per Week: 0 days    Minutes of Exercise per Session: 0 min  Stress: No Stress Concern Present (05/05/2021)   [place]    Feeling of Stress : Not at all  Social Connections: Moderately Isolated (05/05/2021)   Social Connection and Isolation Panel [NHANES]    Frequency of Communication with Friends and Family: Three times a week    Frequency of Social Gatherings with Friends and Family: More than three times a week    Attends Religious Services: Never    Marine scientist or Organizations: No    Attends Archivist Meetings: Never    Marital Status: Married  Human resources officer Violence: Not At Risk (05/05/2021)   Humiliation, Afraid, Rape, and Kick questionnaire    Fear of Current or Ex-Partner: No    Emotionally Abused: No    Physically Abused: No    Sexually Abused: No   Current Outpatient Medications on File Prior to Visit  Medication Sig Dispense Refill   albuterol (PROVENTIL) (2.5 MG/3ML) 0.083% nebulizer solution Take 3 mLs (2.5 mg total) by nebulization every 4 (four) hours as needed for wheezing or shortness of breath (Dx: J45.50). 75 mL 1   albuterol (VENTOLIN HFA) 108 (90 Base) MCG/ACT inhaler INHALE 2 PUFFS INTO LUNGS EVERY 6 HOURS AS NEEDED FOR WHEEZING/SHORTNESS OF BREATH 18 each 1   amitriptyline (ELAVIL) 25 MG tablet TAKE 2 TABLETS BY MOUTH EVERY NIGHT AT BEDTIME 180 tablet 0   Apoaequorin (PREVAGEN PO) Take by mouth daily.     clobetasol  ointment (TEMOVATE) 9.62 % Apply 1 application topically at bedtime. Apply to the skin of the vulva for 12 weeks 30 g 2   Continuous Blood Gluc Receiver (FREESTYLE LIBRE 14 DAY READER)  DEVI by Does not apply route.     diclofenac Sodium (VOLTAREN) 1 % GEL Apply 2 g topically 4 (four) times daily. 100 g 3   diphenoxylate-atropine (LOMOTIL) 2.5-0.025 MG tablet 1-2 tabs po qid prn diarrhea 30 tablet 2   felodipine (PLENDIL) 10 MG 24 hr tablet Take 1 tablet (10 mg total) by mouth daily. 90 tablet 1   Ferrous Sulfate (IRON PO) Take by mouth.     fluticasone (CUTIVATE) 0.05 % cream APPLY TO AFFECTED AREA OF R LOWER LEG TWICE PER DAY. 60 g 3   fluticasone (VERAMYST) 27.5 MCG/SPRAY nasal spray Place 1 spray into the nose daily as needed for rhinitis.     furosemide (LASIX) 20 MG tablet Take 1 tablet (20 mg total) by mouth daily. 90 tablet 1   HYDROcodone-acetaminophen (NORCO) 10-325 MG tablet 1 tab po bid 45 tablet 0   insulin NPH Human (NOVOLIN N) 100 UNIT/ML injection INJECT 30 UNITS UNDER THE SKIN 3 TIMES A DAY 30 mL 6   Insulin Syringes, Disposable, U-100 0.3 ML MISC Use to inject insulin--6 injections per day 540 each 3   levocetirizine (XYZAL) 5 MG tablet Take 5 mg by mouth at bedtime as needed for allergies.   5   levothyroxine (SYNTHROID) 125 MCG tablet Take 1 tablet (125 mcg total) by mouth daily. 90 tablet 1   lisinopril (ZESTRIL) 10 MG tablet Take 1 tablet (10 mg total) by mouth daily. 90 tablet 1   meloxicam (MOBIC) 15 MG tablet 1 tab po qd prn pain 30 tablet 1   metFORMIN (GLUCOPHAGE) 500 MG tablet TAKE 1 TABLET BY MOUTH EVERY MORNING AND 2 TABLETS BY MOUTH AT BEDTIME. 270 tablet 0   miconazole (MICOTIN) 2 % powder Apply 1 application topically 2 (two) times daily as needed for itching.     NOVOLOG 100 UNIT/ML injection INJECT 20-25 UNITS INTO THE SKIN 3 TIMES A DAY BEFORE MEALS 30 mL 5   pravastatin (PRAVACHOL) 40 MG tablet Take 1 tablet (40 mg total) by mouth daily. TAKE 1 TABLET (40 MG TOTAL) BY MOUTH DAILY. 90 tablet 1   Probiotic Product (PROBIOTIC PO) Take 1 capsule by mouth daily.     SYMBICORT 160-4.5 MCG/ACT inhaler INHALE 2 PUFFS BY MOUTH TWICE A DAY 30.6 each 3    VITAMIN D PO Take by mouth.     No current facility-administered medications on file prior to visit.   Allergies  Allergen Reactions   Adhesive [Tape] Other (See Comments)    Takes patient's skin off.   Banana Diarrhea and Nausea Only   Eggs Or Egg-Derived Products Diarrhea and Nausea Only   Latex Other (See Comments)    sensativity   Linzess [Linaclotide] Diarrhea   Ozempic (0.25 Or 0.5 Mg-Dose) [Semaglutide(0.25 Or 0.'5mg'$ -Dos)] Diarrhea   Penicillins Rash and Other (See Comments)    Has had cephalosporins without incident   Pine Swelling and Rash   Rose Swelling and Rash   Family History  Problem Relation Age of Onset   Diabetes Father    COPD Father    Stroke Father    Celiac disease Brother    Congenital heart disease Brother    Rheum arthritis Brother    Thyroid disease Brother    Diabetes Brother    Dementia Brother  Lewy Body   Alzheimer's disease Mother    Arthritis Mother    Other Son        Died age 56 -Tetrology of Fallot - VSD/pulmonary atresia   Breast cancer Other        postmenopausal when diagnosed   PE: BP 128/68 (BP Location: Right Arm, Patient Position: Sitting, Cuff Size: Normal)   Pulse (!) 111   Ht '5\' 1"'$  (1.549 m)   Wt 256 lb 9.6 oz (116.4 kg)   LMP 08/10/2010 Comment: Hysterectomy 02/02/18  SpO2 99%   BMI 48.48 kg/m  Wt Readings from Last 3 Encounters:  11/29/21 256 lb 9.6 oz (116.4 kg)  10/21/21 255 lb 12.8 oz (116 kg)  10/08/21 256 lb 12.8 oz (116.5 kg)   Constitutional: obese, in NAD, in motorized wheelchair Eyes: EOMI, no exophthalmos ENT: moist mucous membranes, no thyromegaly, no cervical lymphadenopathy Cardiovascular: tachycardia, RR, No MRG, + bilateral LE pitting edema Respiratory: CTA B Musculoskeletal: no deformities Skin: moist, warm; bilateral periankle/shin erythema Neurological: no tremor with outstretched hands Diabetic Foot Exam - Simple   Simple Foot Form Diabetic Foot exam was performed with the following  findings: Yes 11/29/2021  1:30 PM  Visual Inspection See comments: Yes Sensation Testing See comments: Yes Pulse Check See comments: Yes Comments + B LE edema  - pitting + decreased sensation to monofilament in R foot + decreased pulses bilaterally, likely 2/2 edema    ASSESSMENT: 1. DM2, insulin-dependent, uncontrolled, with complications - mild NP DR OD  2. HL  3.  Obesity class III  PLAN:  1. Patient with longstanding, uncontrolled, type 2 diabetes, on metformin and basal-bolus insulin regimen with NPH and NovoLog insulin, with good control lately.  At last visit, HbA1c was lower, at 6.4%.  Sugars were fluctuating within the normal range with slightly higher blood sugars after dinner, but still within target range.  Sugars were increasing during the night with a peak in the morning, between 730 and 10 AM.  We discussed about moving NPH from dinnertime to bedtime to see if this improves her morning sugars. CGM interpretation: -At today's visit, we reviewed her CGM downloads: It appears that 88% of values are in target range (goal >70%), while 10% are higher than 180 (goal <25%), and 1% are lower than 70 (goal <4%).  The calculated average blood sugar is 132.  The projected HbA1c for the next 3 months (GMI) is 6.5%. -Reviewing the CGM trends, her sugars appear to be well controlled throughout the day, with only occasional hyperglycemic spikes.  She also has instances of  lower blood sugars, even down to 54 especially in the middle of the day.  Therefore, I advised her to reduce NovoLog before breakfast.  She tells me that when she eats more carbs, she increases the dose of NPH for that meal.  I advised her that she may increase the dose of NovoLog by 5 units, but the dose of NPH should not be varied based on the size of the meal. -For the, we can continue the rest of the regimen - I suggested to:  Patient Instructions  Please continue: - Metformin 500 mg in am and 1000 mg with  dinner  Try to change: Insulin Before breakfast Before lunch Before dinner Bedtime  Novolog 22-25 >> 18-20 10-15 15-20 -  NPH 20 15 - 35   Please come back for a follow-up appointment in 4-6 months.  - we checked her HbA1c: 6.1% (better) - advised to check sugars  at different times of the day - 4x a day, rotating check times - advised for yearly eye exams >> she is UTD - return to clinic in 4-6 months  2. HL -Reviewed latest lipid panel from 01/2021: LDL at goal, HDL slightly low: Lab Results  Component Value Date   CHOL 109 02/02/2021   HDL 37.80 (L) 02/02/2021   LDLCALC 41 02/02/2021   LDLDIRECT 78.0 10/05/2018   TRIG 150.0 (H) 02/02/2021   CHOLHDL 3 02/02/2021  -She continues on pravastatin 20 mg daily without side effects  3.  Obesity class III -Unfortunately, we could not continue with Ozempic, which would have helped with weight loss -At last visit, she lost 12 pounds -At this visit, she gained 6 pounds back.  Philemon Kingdom, MD PhD [facility] Endocrinology

## 2021-11-29 NOTE — Patient Instructions (Addendum)
Please continue: - Metformin 500 mg in am and 1000 mg with dinner  Try to change: Insulin Before breakfast Before lunch Before dinner Bedtime  Novolog 22-25 >> 18-20 10-15 15-20 -  NPH 20 15 - 35   Please come back for a follow-up appointment in 4-6 months.

## 2021-12-03 ENCOUNTER — Other Ambulatory Visit: Payer: Self-pay | Admitting: Family Medicine

## 2021-12-03 ENCOUNTER — Other Ambulatory Visit: Payer: Self-pay | Admitting: Internal Medicine

## 2021-12-06 ENCOUNTER — Ambulatory Visit: Payer: Medicare Other | Admitting: Family Medicine

## 2021-12-07 ENCOUNTER — Other Ambulatory Visit: Payer: Self-pay | Admitting: Internal Medicine

## 2021-12-17 ENCOUNTER — Ambulatory Visit (INDEPENDENT_AMBULATORY_CARE_PROVIDER_SITE_OTHER): Payer: Medicare Other | Admitting: Family Medicine

## 2021-12-17 ENCOUNTER — Encounter: Payer: Self-pay | Admitting: Family Medicine

## 2021-12-17 VITALS — BP 121/68 | HR 93 | Temp 98.1°F | Ht 61.0 in | Wt 260.4 lb

## 2021-12-17 DIAGNOSIS — R7989 Other specified abnormal findings of blood chemistry: Secondary | ICD-10-CM

## 2021-12-17 DIAGNOSIS — M792 Neuralgia and neuritis, unspecified: Secondary | ICD-10-CM | POA: Diagnosis not present

## 2021-12-17 DIAGNOSIS — M159 Polyosteoarthritis, unspecified: Secondary | ICD-10-CM | POA: Diagnosis not present

## 2021-12-17 DIAGNOSIS — N183 Chronic kidney disease, stage 3 unspecified: Secondary | ICD-10-CM

## 2021-12-17 DIAGNOSIS — I10 Essential (primary) hypertension: Secondary | ICD-10-CM

## 2021-12-17 DIAGNOSIS — G894 Chronic pain syndrome: Secondary | ICD-10-CM | POA: Diagnosis not present

## 2021-12-17 MED ORDER — HYDROCODONE-ACETAMINOPHEN 10-325 MG PO TABS: ORAL_TABLET | ORAL | 0 refills | Status: DC

## 2021-12-17 NOTE — Progress Notes (Signed)
OFFICE VISIT  12/17/2021  CC:  Chief Complaint  Patient presents with   Hypertension   Patient is a 71 y.o. female who presents for 3 mo f/u chronic pain syndrome and hypertension.  INTERIM HX: Doing fine.  Pain control good, takes 1 vicodin nightly, sometimes 1/2-1 additional tab. Indication for chronic opioid: osteoarthritis in hands, low back, feet, L hip, knees, wrists+chemotherapy -induced neuropathy (burning and numbness in feet). Takes 1-2 hydrocodone tabs a day typically.  Not NSAID candidate due to renal insufficiency. PMP AWARE reviewed today: most recent rx for Vicodin was filled 11/12/2021, #45, rx by me. No red flags.  She had a slight bump in her serum creatinine last visit.  No med changes made. Follow-up check on June 2 showed stability, GFR around 60 mL a minute. She does take meloxicam daily long term, feels like it helps a little.   Home bp's 120-130s syst, P93.  ROS as above, plus--> no fevers, no CP, no SOB,  no cough, no dizziness, no HAs, no rashes, no melena/hematochezia.  No polyuria or polydipsia.  No focal weakness, paresthesias, or tremors.  No acute vision or hearing abnormalities.  No dysuria or unusual/new urinary urgency or frequency.  No recent changes in lower legs. No n/v/d or abd pain.  No palpitations.    Past Medical History:  Diagnosis Date   Allergic rhinitis    Allergy testing: results pending as of 05/28/16 (Dr. Harold Hedge)   Anemia 10/2020   anemia of chronic dz (? + mild IDA?--iron started   Arthritis    Chemotherapy-induced neuropathy ([place]) 2019   Cough variant asthma    DDD (degenerative disc disease), lumbar    Diabetes mellitus with complication ([place])    Mild nonprolif DR R eye 12/2017-->referred to retinal specialist DM MANAGED BY DR. GHERGHE AS OF 2021   Endometrial cancer ([place]) 02/2018   REMISSION AS OF 08/2018.  Stage III (T3, Nx, Mx)  Path: endometroid adenocarcinoma involving cervix, uterus, and fallopian tubes (Pt got TAH &  BSO 02/2018). No sign of recurrence as of onc f/u 05/2019.   GERD (gastroesophageal reflux disease)    Hemorrhoids    Herpes zoster 10/2015   L side belt-line   History of adenomatous polyp of colon 02/25/2013   5 mm cecal polyp removed by Dr. Trevor Mace 5 yrs   History of blood transfusion    History of cellulitis 08/2010   Left (Since replacemnt of Left knee   History of radiation therapy 05/21/2018   [facility] brachytherapy 04/24/2018-05/21/2018  Dr Gery Pray   Hyperkalemia 03/2018   started after pt started getting chemo-->had to stop enalapril.   Hyperlipidemia    Not on statin b/c lipids "stayed down when sugars came down" per pt.  She says Dr. Chalmers Cater knows she is not on statin anymore.   Hypertension    Hypothyroidism    Nephrolithiasis 07/2005   Obesity    OSA on CPAP    Osteoarthritis    knees, ankle, + ? scapholunate ligament disruption (x-ray 06/2017)--ortho referral.   Pancytopenia due to antineoplastic chemotherapy ([place])    progressive as of 06/2018. Stable 08/2018.   Recurrent UTI    started cipro 250 qd 09/2018   Sciatica of left side    Severe persistent asthma    cough-variant--saw Allergist 05/25/16 and was switched from max dose advair to symbicort.   Systolic murmur    ECHO fine 10/2016-->murmur likely flow murmur assoc with HTN.   Zoon's vulvitis  Bx-proven (GYN) -lichen sclerosis.  Clobetasol 0.05% ointment per GYN    Past Surgical History:  Procedure Laterality Date   ANKLE FRACTURE SURGERY  1984   Pin & repair   ARTHROSCOPIC REPAIR ACL  2000   Due to ACL tear   ARTHROSCOPIC REPAIR ACL  5/08   Blateral knee rerlacements x4 2 times each knee     BREAST BIOPSY Right 2014   Benign   CARDIAC CATHETERIZATION  5/07   clear vessel mild mitral    CARPAL TUNNEL RELEASE Right [id number]   1989 Left   CESAREAN SECTION  1985   CHOLECYSTECTOMY OPEN  1988   COLONOSCOPY N/A 02/25/2013   Tubular adenoma x 1: Recall 5 yrs. Procedure: COLONOSCOPY;  Surgeon: Juanita Craver, MD;  Location: [facility];  Service: Endoscopy;  Laterality: N/A;   COMBINED HYSTEROSCOPY DIAGNOSTIC / D&C  2/12   Bx neg   DEXA  08/2007; 05/12/20   Bone density normal 2009 and 2022.  Plan rpt 2024.   DILATION AND CURETTAGE OF UTERUS  12/11   IR IMAGING GUIDED PORT INSERTION  03/16/2018   IR REMOVAL TUN ACCESS W/ PORT W/O FL MOD SED  09/20/2018   LYMPH NODE BIOPSY N/A 02/19/2018   Procedure: Sentinel LYMPH NODE BIOPSY;  Surgeon: Everitt Amber, MD;  Location: [facility];  Service: Gynecology;  Laterality: N/A;   REPLACEMENT TOTAL KNEE Left 5/12   X 2 on each   ROBOTIC ASSISTED TOTAL HYSTERECTOMY WITH BILATERAL SALPINGO OOPHERECTOMY N/A 02/19/2018   For endometrial cancer.  Procedure: XI ROBOTIC ASSISTED TOTAL HYSTERECTOMY WITH BILATERAL SALPINGO OOPHORECTOMY;  Surgeon: Everitt Amber, MD;  Location: [place];  Service: Gynecology;  Laterality: N/A;   TRANSTHORACIC ECHOCARDIOGRAM  10/11/2016    EF 55-60%, grd I DD.   TUBAL LIGATION  1985   C-Section    Outpatient Medications Prior to Visit  Medication Sig Dispense Refill   albuterol (PROVENTIL) (2.5 MG/3ML) 0.083% nebulizer solution Take 3 mLs (2.5 mg total) by nebulization every 4 (four) hours as needed for wheezing or shortness of breath (Dx: J45.50). 75 mL 1   albuterol (VENTOLIN HFA) 108 (90 Base) MCG/ACT inhaler INHALE 2 PUFFS INTO LUNGS EVERY 6 HOURS AS NEEDED FOR WHEEZING/SHORTNESS OF BREATH 18 each 1   amitriptyline (ELAVIL) 25 MG tablet TAKE 2 TABLETS BY MOUTH EVERY NIGHT AT BEDTIME 180 tablet 0   Apoaequorin (PREVAGEN PO) Take by mouth daily.     clobetasol ointment (TEMOVATE) 2.95 % Apply 1 application topically at bedtime. Apply to the skin of the vulva for 12 weeks 30 g 2   Continuous Blood Gluc Receiver (FREESTYLE LIBRE 14 DAY READER) DEVI by Does not apply route.     diclofenac Sodium (VOLTAREN) 1 % GEL Apply 2 g topically 4 (four) times daily. 100 g 3   diphenoxylate-atropine (LOMOTIL)  2.5-0.025 MG tablet 1-2 tabs po qid prn diarrhea 30 tablet 2   felodipine (PLENDIL) 10 MG 24 hr tablet Take 1 tablet (10 mg total) by mouth daily. 90 tablet 1   Ferrous Sulfate (IRON PO) Take by mouth.     fluticasone (CUTIVATE) 0.05 % cream APPLY TO AFFECTED AREA OF R LOWER LEG TWICE PER DAY. 60 g 3   fluticasone (VERAMYST) 27.5 MCG/SPRAY nasal spray Place 1 spray into the nose daily as needed for rhinitis.     furosemide (LASIX) 20 MG tablet Take 1 tablet (20 mg total) by mouth daily. 90 tablet 1   insulin NPH Human (NOVOLIN  N) 100 UNIT/ML injection Inject 0.3 mLs (30 Units total) into the skin 3 (three) times daily before meals. INJECT 30 UNITS UNDER THE SKIN 3 TIMES A DAY 90 mL 1   Insulin Syringes, Disposable, U-100 0.3 ML MISC Use to inject insulin--6 injections per day 540 each 3   levocetirizine (XYZAL) 5 MG tablet Take 5 mg by mouth at bedtime as needed for allergies.   5   levothyroxine (SYNTHROID) 125 MCG tablet Take 1 tablet (125 mcg total) by mouth daily. 90 tablet 1   lisinopril (ZESTRIL) 10 MG tablet Take 1 tablet (10 mg total) by mouth daily. 90 tablet 1   metFORMIN (GLUCOPHAGE) 500 MG tablet TAKE 1 TABLET BY MOUTH EVERY MORNING AND 2 TABLETS BY MOUTH AT BEDTIME. 270 tablet 0   miconazole (MICOTIN) 2 % powder Apply 1 application topically 2 (two) times daily as needed for itching.     NOVOLOG 100 UNIT/ML injection INJECT 20-25 UNITS INTO THE SKIN 3 TIMES A DAY BEFORE MEALS 30 mL 5   pravastatin (PRAVACHOL) 40 MG tablet Take 1 tablet (40 mg total) by mouth daily. TAKE 1 TABLET (40 MG TOTAL) BY MOUTH DAILY. 90 tablet 1   Probiotic Product (PROBIOTIC PO) Take 1 capsule by mouth daily.     SYMBICORT 160-4.5 MCG/ACT inhaler INHALE 2 PUFFS BY MOUTH TWICE A DAY 30.6 each 3   VITAMIN D PO Take by mouth.     HYDROcodone-acetaminophen (NORCO) 10-325 MG tablet 1 tab po bid 45 tablet 0   meloxicam (MOBIC) 15 MG tablet 1 tab po qd prn pain 30 tablet 1   No facility-administered medications  prior to visit.    Allergies  Allergen Reactions   Adhesive [Tape] Other (See Comments)    Takes patient's skin off.   Banana Diarrhea and Nausea Only   Eggs Or Egg-Derived Products Diarrhea and Nausea Only   Latex Other (See Comments)    sensativity   Linzess [Linaclotide] Diarrhea   Ozempic (0.25 Or 0.5 Mg-Dose) [Semaglutide(0.25 Or 0.'5mg'$ -Dos)] Diarrhea   Penicillins Rash and Other (See Comments)    Has had cephalosporins without incident   Pine Swelling and Rash   Rose Swelling and Rash    ROS As per HPI  PE:    12/17/2021    2:01 PM 11/29/2021    1:19 PM 10/21/2021    1:08 PM  Vitals with BMI  Height '5\' 1"'$  '5\' 1"'$  '5\' 1"'$   Weight 260 lbs 6 oz 256 lbs 10 oz 255 lbs 13 oz  BMI 49.23 14.43 15.40  Systolic 086 761 950  Diastolic 71 68 66  Pulse 93 111 97   Physical Exam  Gen: Alert, well appearing.  Patient is oriented to person, place, time, and situation. AFFECT: pleasant, lucid thought and speech. CV: RRR, no m/r/g.   LUNGS: CTA bilat, nonlabored resps, good aeration in all lung fields.   LABS:  Last CBC Lab Results  Component Value Date   WBC 8.9 02/02/2021   HGB 11.5 (L) 02/02/2021   HCT 34.9 (L) 02/02/2021   MCV 94.4 02/02/2021   MCH 34.6 (H) 09/20/2018   RDW 15.7 (H) 02/02/2021   PLT 224.0 02/02/2021   Lab Results  Component Value Date   IRON 49 02/02/2021   TIBC 364 02/02/2021   FERRITIN 31 02/02/2021   Lab Results  Component Value Date   DTOIZTIW58 099 83/38/2505   Last metabolic panel Lab Results  Component Value Date   GLUCOSE 73 09/10/2021   NA  138 09/10/2021   K 4.5 09/10/2021   CL 103 09/10/2021   CO2 27 09/10/2021   BUN 41 (H) 09/10/2021   CREATININE 0.96 09/10/2021   GFRNONAA >60 06/16/2020   CALCIUM 9.3 09/10/2021   PROT 7.1 09/02/2021   ALBUMIN 3.8 09/02/2021   BILITOT 0.2 09/02/2021   ALKPHOS 58 09/02/2021   AST 18 09/02/2021   ALT 16 09/02/2021   ANIONGAP 10 06/16/2020   Last lipids Lab Results  Component Value Date    CHOL 109 02/02/2021   HDL 37.80 (L) 02/02/2021   LDLCALC 41 02/02/2021   LDLDIRECT 78.0 10/05/2018   TRIG 150.0 (H) 02/02/2021   CHOLHDL 3 02/02/2021   Last hemoglobin A1c Lab Results  Component Value Date   HGBA1C 6.1 (A) 11/29/2021   Last thyroid functions Lab Results  Component Value Date   TSH 0.76 02/02/2021   IMPRESSION AND PLAN:  1) Chronic pain syndrome: Osteoarthritis multiple sites, also neuropathic pain. Stable. We have to get her off meloxicam due to chronic renal insufficiency. Okay to take a half or whole extra tablet of Vicodin in the daytime if needed. Vicodin 10-325, 1 twice daily, #45.  #2 hypertension, well controlled at home.  Mildly elevated here today.  If becomes persistently greater than 140 at home then we will have to either cautiously increase her lisinopril or add another medication. Electrolytes and creatinine today.  #3 chronic renal insufficiency stage II/III. We will stop her meloxicam.  Avoid NSAIDs in the future. Electrolytes and creatinine today.  An After Visit Summary was printed and given to the patient.  FOLLOW UP: Return in about 3 months (around 03/18/2022) for routine chronic illness f/u.  Signed:  Crissie Sickles, MD           12/17/2021

## 2021-12-18 LAB — BASIC METABOLIC PANEL
BUN/Creatinine Ratio: 50 (calc) — ABNORMAL HIGH (ref 6–22)
BUN: 58 mg/dL — ABNORMAL HIGH (ref 7–25)
CO2: 22 mmol/L (ref 20–32)
Calcium: 9.3 mg/dL (ref 8.6–10.4)
Chloride: 103 mmol/L (ref 98–110)
Creat: 1.15 mg/dL — ABNORMAL HIGH (ref 0.60–1.00)
Glucose, Bld: 121 mg/dL — ABNORMAL HIGH (ref 65–99)
Potassium: 5 mmol/L (ref 3.5–5.3)
Sodium: 138 mmol/L (ref 135–146)

## 2021-12-20 ENCOUNTER — Telehealth: Payer: Self-pay

## 2021-12-20 DIAGNOSIS — R7989 Other specified abnormal findings of blood chemistry: Secondary | ICD-10-CM

## 2021-12-20 NOTE — Telephone Encounter (Signed)
-----   Message from Tammi Sou, MD sent at 12/20/2021 12:29 PM EDT ----- Her labs indicate that she needs to drink more fluids. Make sure she is drinking at least 65 ounces of clear fluids every day. Recheck nonfasting basic metabolic panel in 7 to 10 days, diagnosis elevated serum creatinine.

## 2021-12-21 NOTE — Addendum Note (Signed)
Addended by: Beatrix Fetters on: 12/21/2021 03:36 PM   Modules accepted: Orders

## 2021-12-22 NOTE — Progress Notes (Signed)
Radiation Oncology         [phone] ________________________________  Name: [name] MRN: [id number]  Date: 12/23/2021  DOB: 1951-02-12  Follow-Up Visit Note  CC: McGowen, Adrian Blackwater, MD  Everitt Amber, MD    ICD-10-CM   1. Endometrial cancer ([place])  C54.1       Diagnosis: Stage IIIA endometroid endometrial carcinoma  Interval Since Last Radiation:  3 years, 7 months, and 4 days    Radiation treatment dates: 04/24/2018, 05/03/2018, 05/07/2018, 05/16/2018, 05/21/2018   Site/dose:  Vaginal cuff, 6 Gy in 5 fractions for a total dose of 30 Gy   Narrative:  The patient returns today for routine annual follow-up. Since her last visit, the patient followed up with Dr. Delsa Sale (gyn-onc) on 08/18/21. During which time, the patient denied any symptoms concerning for disease recurrence and was noted as NED on examination.       Otherwise, no significant oncologic interval history since the patient was last seen.   She denies any abdominal bloating pelvic pain vaginal bleeding or discharge.  She occasionally will use her dilator.  She reports no bleeding after use.  Given the interval since her radiation treatment I discussed that she could discontinue using this equipment.                          Allergies:  is allergic to adhesive [tape], banana, eggs or egg-derived products, latex, linzess [linaclotide], ozempic (0.25 or 0.5 mg-dose) [semaglutide(0.25 or 0.'5mg'$ -dos)], penicillins, pine, and rose.  Meds: Current Outpatient Medications  Medication Sig Dispense Refill   albuterol (PROVENTIL) (2.5 MG/3ML) 0.083% nebulizer solution Take 3 mLs (2.5 mg total) by nebulization every 4 (four) hours as needed for wheezing or shortness of breath (Dx: J45.50). 75 mL 1   albuterol (VENTOLIN HFA) 108 (90 Base) MCG/ACT inhaler INHALE 2 PUFFS INTO LUNGS EVERY 6 HOURS AS NEEDED FOR WHEEZING/SHORTNESS OF BREATH 18 each 1   amitriptyline (ELAVIL) 25 MG tablet TAKE 2 TABLETS BY MOUTH EVERY NIGHT  AT BEDTIME 180 tablet 0   Apoaequorin (PREVAGEN PO) Take by mouth daily.     clobetasol ointment (TEMOVATE) 5.32 % Apply 1 application topically at bedtime. Apply to the skin of the vulva for 12 weeks 30 g 2   Continuous Blood Gluc Receiver (FREESTYLE LIBRE 14 DAY READER) DEVI by Does not apply route.     diclofenac Sodium (VOLTAREN) 1 % GEL Apply 2 g topically 4 (four) times daily. 100 g 3   diphenoxylate-atropine (LOMOTIL) 2.5-0.025 MG tablet 1-2 tabs po qid prn diarrhea 30 tablet 2   felodipine (PLENDIL) 10 MG 24 hr tablet Take 1 tablet (10 mg total) by mouth daily. 90 tablet 1   Ferrous Sulfate (IRON PO) Take by mouth.     fluticasone (CUTIVATE) 0.05 % cream APPLY TO AFFECTED AREA OF R LOWER LEG TWICE PER DAY. 60 g 3   fluticasone (VERAMYST) 27.5 MCG/SPRAY nasal spray Place 1 spray into the nose daily as needed for rhinitis.     furosemide (LASIX) 20 MG tablet Take 1 tablet (20 mg total) by mouth daily. 90 tablet 1   HYDROcodone-acetaminophen (NORCO) 10-325 MG tablet 1 tab po bid 45 tablet 0   insulin NPH Human (NOVOLIN N) 100 UNIT/ML injection Inject 0.3 mLs (30 Units total) into the skin 3 (three) times daily before meals. INJECT 30 UNITS UNDER THE SKIN 3 TIMES A DAY 90 mL 1   Insulin Syringes, Disposable, U-100 0.3 ML  MISC Use to inject insulin--6 injections per day 540 each 3   levocetirizine (XYZAL) 5 MG tablet Take 5 mg by mouth at bedtime as needed for allergies.   5   levothyroxine (SYNTHROID) 125 MCG tablet Take 1 tablet (125 mcg total) by mouth daily. 90 tablet 1   lisinopril (ZESTRIL) 10 MG tablet Take 1 tablet (10 mg total) by mouth daily. 90 tablet 1   metFORMIN (GLUCOPHAGE) 500 MG tablet TAKE 1 TABLET BY MOUTH EVERY MORNING AND 2 TABLETS BY MOUTH AT BEDTIME. 270 tablet 0   miconazole (MICOTIN) 2 % powder Apply 1 application topically 2 (two) times daily as needed for itching.     NOVOLOG 100 UNIT/ML injection INJECT 20-25 UNITS INTO THE SKIN 3 TIMES A DAY BEFORE MEALS 30 mL 5    pravastatin (PRAVACHOL) 40 MG tablet Take 1 tablet (40 mg total) by mouth daily. TAKE 1 TABLET (40 MG TOTAL) BY MOUTH DAILY. 90 tablet 1   Probiotic Product (PROBIOTIC PO) Take 1 capsule by mouth daily.     SYMBICORT 160-4.5 MCG/ACT inhaler INHALE 2 PUFFS BY MOUTH TWICE A DAY 30.6 each 3   VITAMIN D PO Take by mouth.     No current facility-administered medications for this encounter.    Physical Findings: The patient is in no acute distress. Patient is alert and oriented.  height is '5\' 1"'$  (1.549 m) and weight is 255 lb 12.8 oz (116 kg). Her temperature is 98.1 F (36.7 C). Her blood pressure is 141/50 (abnormal) and her pulse is 104 (abnormal). Her respiration is 24 (abnormal) and oxygen saturation is 95%. .  No significant changes. Lungs are clear to auscultation bilaterally. Heart has regular rate and rhythm. No palpable cervical, supraclavicular, or axillary adenopathy. Abdomen soft, non-tender, normal bowel sounds.  On pelvic examination the external genitalia were unremarkable. A speculum exam was performed. There are no mucosal lesions noted in the vaginal vault.  On bimanual examination there is no palpable vaginal lesion or dominant pelvic mass however exam is somewhat compromised in light of her body habitus.   Lab Findings: Lab Results  Component Value Date   WBC 8.9 02/02/2021   HGB 11.5 (L) 02/02/2021   HCT 34.9 (L) 02/02/2021   MCV 94.4 02/02/2021   PLT 224.0 02/02/2021    Radiographic Findings: No results found.  Impression: Stage IIIA endometroid endometrial carcinoma  No evidence of recurrence on clinical exam today.  Plan: She will meet with Dr. Glennon MacLaurance Flatten in 6 months.  Routine follow-up in radiation oncology in 1 year.   20 minutes of total time was spent for this patient encounter, including preparation, face-to-face counseling with the patient and coordination of care, physical exam, and documentation of the  encounter. ____________________________________  Blair Promise, PhD, MD  This document serves as a record of services personally performed by Gery Pray, MD. It was created on his behalf by Roney Mans, a trained medical scribe. The creation of this record is based on the scribe's personal observations and the provider's statements to them. This document has been checked and approved by the attending provider.

## 2021-12-23 ENCOUNTER — Encounter: Payer: Self-pay | Admitting: Radiation Oncology

## 2021-12-23 ENCOUNTER — Ambulatory Visit
Admission: RE | Admit: 2021-12-23 | Discharge: 2021-12-23 | Disposition: A | Discharge status: Home / Self Care | Payer: Medicare Other | Source: Ambulatory Visit | Attending: Radiation Oncology | Admitting: Radiation Oncology

## 2021-12-23 DIAGNOSIS — Z8542 Personal history of malignant neoplasm of other parts of uterus: Secondary | ICD-10-CM | POA: Insufficient documentation

## 2021-12-23 DIAGNOSIS — Z79890 Hormone replacement therapy (postmenopausal): Secondary | ICD-10-CM | POA: Diagnosis not present

## 2021-12-23 DIAGNOSIS — Z923 Personal history of irradiation: Secondary | ICD-10-CM | POA: Diagnosis not present

## 2021-12-23 DIAGNOSIS — C541 Malignant neoplasm of endometrium: Secondary | ICD-10-CM | POA: Diagnosis not present

## 2021-12-23 DIAGNOSIS — Z79899 Other long term (current) drug therapy: Secondary | ICD-10-CM | POA: Diagnosis not present

## 2021-12-23 DIAGNOSIS — Z7951 Long term (current) use of inhaled steroids: Secondary | ICD-10-CM | POA: Diagnosis not present

## 2021-12-23 DIAGNOSIS — Z7984 Long term (current) use of oral hypoglycemic drugs: Secondary | ICD-10-CM | POA: Insufficient documentation

## 2021-12-23 NOTE — Progress Notes (Signed)
[name] is here today for follow up post radiation to the pelvic.  They completed their radiation on: 05/21/18  Does the patient complain of any of the following:  Pain: Patient reports having generalized pain, also reports having sciatica to left leg.  Abdominal bloating: No Diarrhea/Constipation: Yes, both at times.  Nausea/Vomiting: No Vaginal Discharge: No Blood in Urine or Stool: Reports having hemorrhoids. Notices blood in stool at times.  Urinary Issues (dysuria/incomplete emptying/ incontinence/ increased frequency/urgency): Urinary incontinence. Does patient report using vaginal dilator 2-3 times a week and/or sexually active 2-3 weeks: Patient using dilators once every 2 weeks.  Post radiation skin changes: Yes, patient reports having a skin reaction to groin, currently using clobetasol at bedtime.    Additional comments if applicable:    BP (!) 280/03 (BP Location: Right Arm, Patient Position: Sitting, Cuff Size: Large)   Pulse (!) 104   Temp 98.1 F (36.7 C)   Resp (!) 24   Ht '5\' 1"'$  (1.549 m)   Wt 255 lb 12.8 oz (116 kg)   LMP 08/10/2010 Comment: Hysterectomy 02/02/18  SpO2 95%   BMI 48.33 kg/m

## 2021-12-31 ENCOUNTER — Other Ambulatory Visit (INDEPENDENT_AMBULATORY_CARE_PROVIDER_SITE_OTHER): Payer: Medicare Other

## 2021-12-31 DIAGNOSIS — R7989 Other specified abnormal findings of blood chemistry: Secondary | ICD-10-CM | POA: Diagnosis not present

## 2021-12-31 LAB — BASIC METABOLIC PANEL
BUN: 46 mg/dL — ABNORMAL HIGH (ref 6–23)
CO2: 28 mEq/L (ref 19–32)
Calcium: 9.4 mg/dL (ref 8.4–10.5)
Chloride: 103 mEq/L (ref 96–112)
Creatinine, Ser: 0.99 mg/dL (ref 0.40–1.20)
GFR: 57.57 mL/min — ABNORMAL LOW (ref >60.00)
Glucose, Bld: 95 mg/dL (ref 70–99)
Potassium: 5 mEq/L (ref 3.5–5.1)
Sodium: 140 mEq/L (ref 135–145)

## 2022-01-11 ENCOUNTER — Other Ambulatory Visit: Payer: Self-pay | Admitting: Family Medicine

## 2022-02-09 ENCOUNTER — Other Ambulatory Visit: Payer: Self-pay | Admitting: Family Medicine

## 2022-02-11 MED ORDER — HYDROCODONE-ACETAMINOPHEN 10-325 MG PO TABS: ORAL_TABLET | ORAL | 0 refills | Status: DC

## 2022-02-16 ENCOUNTER — Telehealth: Payer: Self-pay | Admitting: Family Medicine

## 2022-02-16 MED ORDER — HYDROCODONE-ACETAMINOPHEN 5-325 MG PO TABS: ORAL_TABLET | ORAL | 0 refills | Status: DC

## 2022-02-16 NOTE — Telephone Encounter (Signed)
Spoke with pharmacy, they only have 5-'325mg'$  available but have enough to substitute for dose change.

## 2022-02-16 NOTE — Telephone Encounter (Signed)
I need to know what strength of hydrocodone/acetaminophen her pharmacy has available before I do rx.

## 2022-02-16 NOTE — Telephone Encounter (Signed)
Please review and advise.

## 2022-02-16 NOTE — Telephone Encounter (Signed)
PT is calling about her prescription for Hydrocodone. She reports that he pharmacy is currently having issue with getting this in stock. I advised her to call local pharmacies to see who may have it in stock and give [place] a call back. She also requested if there can be something else called in?  Please advise patient.

## 2022-02-16 NOTE — Telephone Encounter (Signed)
Pt advised refill sent. °

## 2022-02-16 NOTE — Telephone Encounter (Signed)
Okay, Vicodin 5-325 prescription sent.

## 2022-02-20 ENCOUNTER — Other Ambulatory Visit: Payer: Self-pay | Admitting: Internal Medicine

## 2022-02-20 ENCOUNTER — Other Ambulatory Visit: Payer: Self-pay | Admitting: Family Medicine

## 2022-02-28 ENCOUNTER — Telehealth: Payer: Self-pay

## 2022-02-28 ENCOUNTER — Other Ambulatory Visit: Payer: Self-pay | Admitting: Family Medicine

## 2022-02-28 ENCOUNTER — Other Ambulatory Visit: Payer: Self-pay | Admitting: Gynecologic Oncology

## 2022-02-28 DIAGNOSIS — N904 Leukoplakia of vulva: Secondary | ICD-10-CM

## 2022-02-28 MED ORDER — CLOBETASOL PROPIONATE 0.05 % EX OINT: 1.0000 | TOPICAL_OINTMENT | CUTANEOUS | 2 refills | Status: DC | PRN

## 2022-02-28 NOTE — Progress Notes (Signed)
See CMA note.  

## 2022-02-28 NOTE — Telephone Encounter (Signed)
Pt is aware of Clobetasol ointment being sent to pharmacy

## 2022-02-28 NOTE — Telephone Encounter (Signed)
Refill request received from [facility] on Clobetasol Ointment. Pt states she uses it ointment sporadically and is about out. She last saw Dr.Jackson-Moore 08/2021, and [place] in 12/2021.   Follow up appointment made I'm March for [facility]

## 2022-03-15 ENCOUNTER — Ambulatory Visit (INDEPENDENT_AMBULATORY_CARE_PROVIDER_SITE_OTHER): Payer: Medicare Other | Admitting: Family Medicine

## 2022-03-15 VITALS — BP 144/68 | HR 102 | Temp 98.3°F | Ht 61.0 in | Wt 260.0 lb

## 2022-03-15 DIAGNOSIS — B9689 Other specified bacterial agents as the cause of diseases classified elsewhere: Secondary | ICD-10-CM

## 2022-03-15 DIAGNOSIS — J988 Other specified respiratory disorders: Secondary | ICD-10-CM | POA: Diagnosis not present

## 2022-03-15 DIAGNOSIS — J4521 Mild intermittent asthma with (acute) exacerbation: Secondary | ICD-10-CM | POA: Diagnosis not present

## 2022-03-15 DIAGNOSIS — R062 Wheezing: Secondary | ICD-10-CM | POA: Diagnosis not present

## 2022-03-15 LAB — POCT INFLUENZA A/B
Influenza A, POC: NEGATIVE
Influenza B, POC: NEGATIVE

## 2022-03-15 LAB — POC COVID19 BINAXNOW: SARS Coronavirus 2 Ag: NEGATIVE

## 2022-03-15 MED ORDER — IPRATROPIUM-ALBUTEROL 0.5-2.5 (3) MG/3ML IN SOLN
3.0000 mL | Freq: Once | RESPIRATORY_TRACT | Status: AC
  Administered 2022-03-15: 3 mL via RESPIRATORY_TRACT

## 2022-03-15 MED ORDER — PREDNISONE 20 MG PO TABS: ORAL_TABLET | ORAL | 0 refills | Status: DC

## 2022-03-15 MED ORDER — METHYLPREDNISOLONE ACETATE 80 MG/ML IJ SUSP
80.0000 mg | Freq: Once | INTRAMUSCULAR | Status: AC
  Administered 2022-03-15: 80 mg via INTRAMUSCULAR

## 2022-03-15 MED ORDER — DOXYCYCLINE HYCLATE 100 MG PO TABS: 100.0000 mg | ORAL_TABLET | Freq: Two times a day (BID) | ORAL | 0 refills | Status: DC

## 2022-03-15 MED ORDER — BENZONATATE 200 MG PO CAPS: 200.0000 mg | ORAL_CAPSULE | Freq: Two times a day (BID) | ORAL | 0 refills | Status: DC | PRN

## 2022-03-15 NOTE — Patient Instructions (Signed)
No follow-ups on file.        Great to see you today.  I have refilled the medication(s) we provide.   If labs were collected, we will inform you of lab results once received either by echart message or telephone call.   - echart message- for normal results that have been seen by the patient already.   - telephone call: abnormal results or if patient has not viewed results in their echart.  

## 2022-03-15 NOTE — Progress Notes (Signed)
[name] , 02/17/1951, 71 y.o., female MRN: [id number] Patient Care Team    Relationship Specialty Notifications Start End  McGowen, Adrian Blackwater, MD PCP - General Family Medicine  09/19/14   Harold Hedge, Darrick Grinder, MD Consulting Physician Allergy and Immunology  05/28/16   Juanita Craver, MD Consulting Physician Gastroenterology  09/06/16   Martinique, Peter M, MD Consulting Physician Cardiology  09/29/16   Megan Salon, MD Consulting Physician Gynecology  12/28/16   Isabel Caprice, MD (Inactive) Consulting Physician Gynecologic Oncology  03/11/18   Everitt Amber, MD Consulting Physician Gynecologic Oncology  03/15/18   Heath Lark, MD Consulting Physician Hematology and Oncology  03/15/18   Philemon Kingdom, MD Consulting Physician Endocrinology  05/15/19     Chief Complaint  Patient presents with   Cough    Pt c/o cough, wheezing, sinus pressure, nasal congestion x 7 days     Subjective: Pt presents for an OV with complaints of cough, wheezing, sinus pressure, nasal congestion of 7 days duration.  Associated symptoms include low grade fever, productive cough and chills. She has been fatigued. She is a t high risk of complications with IDDM, h/o cancer, obesity and other CMC.       03/15/2022    1:36 PM 09/02/2021    1:29 PM 05/05/2021    1:01 PM 03/01/2021    3:42 PM 04/29/2020    1:39 PM  Depression screen PHQ 2/9  Decreased Interest 0 0 0 0 0  Down, Depressed, Hopeless 0 0 0 0 0  PHQ - 2 Score 0 0 0 0 0    Allergies  Allergen Reactions   Adhesive [Tape] Other (See Comments)    Takes patient's skin off.   Banana Diarrhea and Nausea Only   Eggs Or Egg-Derived Products Diarrhea and Nausea Only   Latex Other (See Comments)    sensativity   Linzess [Linaclotide] Diarrhea   Ozempic (0.25 Or 0.5 Mg-Dose) [Semaglutide(0.25 Or 0.'5mg'$ -Dos)] Diarrhea   Penicillins Rash and Other (See Comments)    Has had cephalosporins without incident   Pine Swelling and Rash   Rose Swelling and  Rash   Social History   Social History Narrative   Not on file   Past Medical History:  Diagnosis Date   Allergic rhinitis    Allergy testing: results pending as of 05/28/16 (Dr. Harold Hedge)   Anemia 10/2020   anemia of chronic dz (? + mild IDA?--iron started   Arthritis    Chemotherapy-induced neuropathy ([place]) 2019   Cough variant asthma    DDD (degenerative disc disease), lumbar    Diabetes mellitus with complication ([place])    Mild nonprolif DR R eye 12/2017-->referred to retinal specialist DM MANAGED BY DR. GHERGHE AS OF 2021   Endometrial cancer ([place]) 02/2018   REMISSION AS OF 08/2018.  Stage III (T3, Nx, Mx)  Path: endometroid adenocarcinoma involving cervix, uterus, and fallopian tubes (Pt got TAH & BSO 02/2018). No sign of recurrence as of onc f/u 05/2019.   GERD (gastroesophageal reflux disease)    Hemorrhoids    Herpes zoster 10/2015   L side belt-line   History of adenomatous polyp of colon 02/25/2013   5 mm cecal polyp removed by Dr. Trevor Mace 5 yrs   History of blood transfusion    History of cellulitis 08/2010   Left (Since replacemnt of Left knee   History of radiation therapy 05/21/2018   [facility] brachytherapy 04/24/2018-05/21/2018  Dr Gery Pray  Hyperkalemia 03/2018   started after pt started getting chemo-->had to stop enalapril.   Hyperlipidemia    Not on statin b/c lipids "stayed down when sugars came down" per pt.  She says Dr. Chalmers Cater knows she is not on statin anymore.   Hypertension    Hypothyroidism    Nephrolithiasis 07/2005   Obesity    OSA on CPAP    Osteoarthritis    knees, ankle, + ? scapholunate ligament disruption (x-ray 06/2017)--ortho referral.   Pancytopenia due to antineoplastic chemotherapy ([place])    progressive as of 06/2018. Stable 08/2018.   Recurrent UTI    started cipro 250 qd 09/2018   Sciatica of left side    Severe persistent asthma    cough-variant--saw Allergist 05/25/16 and was switched from max dose advair to symbicort.    Systolic murmur    ECHO fine 10/2016-->murmur likely flow murmur assoc with HTN.   Zoon's vulvitis    Bx-proven (GYN) -lichen sclerosis.  Clobetasol 0.05% ointment per GYN   Past Surgical History:  Procedure Laterality Date   ANKLE FRACTURE SURGERY  1984   Pin & repair   ARTHROSCOPIC REPAIR ACL  2000   Due to ACL tear   ARTHROSCOPIC REPAIR ACL  5/08   Blateral knee rerlacements x4 2 times each knee     BREAST BIOPSY Right 2014   Benign   CARDIAC CATHETERIZATION  5/07   clear vessel mild mitral    CARPAL TUNNEL RELEASE Right [id number]   1989 Left   CESAREAN SECTION  1985   CHOLECYSTECTOMY OPEN  1988   COLONOSCOPY N/A 02/25/2013   Tubular adenoma x 1: Recall 5 yrs. Procedure: COLONOSCOPY;  Surgeon: Juanita Craver, MD;  Location: [facility];  Service: Endoscopy;  Laterality: N/A;   COMBINED HYSTEROSCOPY DIAGNOSTIC / D&C  2/12   Bx neg   DEXA  08/2007; 05/12/20   Bone density normal 2009 and 2022.  Plan rpt 2024.   DILATION AND CURETTAGE OF UTERUS  12/11   IR IMAGING GUIDED PORT INSERTION  03/16/2018   IR REMOVAL TUN ACCESS W/ PORT W/O FL MOD SED  09/20/2018   LYMPH NODE BIOPSY N/A 02/19/2018   Procedure: Sentinel LYMPH NODE BIOPSY;  Surgeon: Everitt Amber, MD;  Location[facility];  Service: Gynecology;  Laterality: N/A;   REPLACEMENT TOTAL KNEE Left 5/12   X 2 on each   ROBOTIC ASSISTED TOTAL HYSTERECTOMY WITH BILATERAL SALPINGO OOPHERECTOMY N/A 02/19/2018   For endometrial cancer.  Procedure: XI ROBOTIC ASSISTED TOTAL HYSTERECTOMY WITH BILATERAL SALPINGO OOPHORECTOMY;  Surgeon: Everitt Amber, MD;  Location: [place];  Service: Gynecology;  Laterality: N/A;   TRANSTHORACIC ECHOCARDIOGRAM  10/11/2016    EF 55-60%, grd I DD.   TUBAL LIGATION  1985   C-Section   Family History  Problem Relation Age of Onset   Diabetes Father    COPD Father    Stroke Father    Celiac disease Brother    Congenital heart disease Brother    Rheum arthritis Brother     Thyroid disease Brother    Diabetes Brother    Dementia Brother        Lewy Body   Alzheimer's disease Mother    Arthritis Mother    Other Son        Died age 69 -Tetrology of Fallot - VSD/pulmonary atresia   Breast cancer Other        postmenopausal when diagnosed   Allergies as of 03/15/2022  Reactions   Adhesive [tape] Other (See Comments)   Takes patient's skin off.   Banana Diarrhea, Nausea Only   Eggs Or Egg-derived Products Diarrhea, Nausea Only   Latex Other (See Comments)   sensativity   Linzess [linaclotide] Diarrhea   Ozempic (0.25 Or 0.5 Mg-dose) [semaglutide(0.25 Or 0.'5mg'$ -dos)] Diarrhea   Penicillins Rash, Other (See Comments)   Has had cephalosporins without incident   Pine Swelling, Rash   Rose Swelling, Rash        Medication List        Accurate as of March 15, 2022  2:50 PM. If you have any questions, ask your nurse or doctor.          albuterol (2.5 MG/3ML) 0.083% nebulizer solution Commonly known as: PROVENTIL Take 3 mLs (2.5 mg total) by nebulization every 4 (four) hours as needed for wheezing or shortness of breath (Dx: J45.50).   albuterol 108 (90 Base) MCG/ACT inhaler Commonly known as: VENTOLIN HFA INHALE 2 PUFFS INTO LUNGS EVERY 6 HOURS AS NEEDED FOR WHEEZING/SHORTNESS OF BREATH   amitriptyline 25 MG tablet Commonly known as: ELAVIL TAKE 2 TABLETS BY MOUTH EVERY NIGHT AT BEDTIME   benzonatate 200 MG capsule Commonly known as: TESSALON Take 1 capsule (200 mg total) by mouth 2 (two) times daily as needed for cough. Started by: Howard Pouch, DO   clobetasol ointment 0.05 % Commonly known as: TEMOVATE Apply 1 Application topically as needed. Apply to the skin of the vulva for itching   diclofenac Sodium 1 % Gel Commonly known as: Voltaren Apply 2 g topically 4 (four) times daily.   diphenoxylate-atropine 2.5-0.025 MG tablet Commonly known as: Lomotil 1-2 tabs po qid prn diarrhea   doxycycline 100 MG tablet Commonly known  as: VIBRA-TABS Take 1 tablet (100 mg total) by mouth 2 (two) times daily. Started by: Howard Pouch, DO   felodipine 10 MG 24 hr tablet Commonly known as: PLENDIL TAKE 1 TABLET BY MOUTH EVERY DAY   fluticasone 0.05 % cream Commonly known as: CUTIVATE APPLY TO AFFECTED AREA OF RIGHT LOWER LEG TWICE PER DAY.   fluticasone 27.5 MCG/SPRAY nasal spray Commonly known as: VERAMYST Place 1 spray into the nose daily as needed for rhinitis.   FreeStyle Libre 14 Day Reader Kerrin Mo by Does not apply route.   furosemide 20 MG tablet Commonly known as: LASIX TAKE 2 TABLETS (40 MG TOTAL) BY MOUTH DAILY.   HYDROcodone-acetaminophen 10-325 MG tablet Commonly known as: NORCO 1 tab po bid   HYDROcodone-acetaminophen 5-325 MG tablet Commonly known as: NORCO/VICODIN 1-2 tabs po bid prn pain   insulin NPH Human 100 UNIT/ML injection Commonly known as: NovoLIN N Inject 0.3 mLs (30 Units total) into the skin 3 (three) times daily before meals. INJECT 30 UNITS UNDER THE SKIN 3 TIMES A DAY   Insulin Syringes (Disposable) U-100 0.3 ML Misc Use to inject insulin--6 injections per day   IRON PO Take by mouth.   levocetirizine 5 MG tablet Commonly known as: XYZAL Take 5 mg by mouth at bedtime as needed for allergies.   levothyroxine 125 MCG tablet Commonly known as: SYNTHROID TAKE 1 TABLET BY MOUTH EVERY DAY   lisinopril 10 MG tablet Commonly known as: ZESTRIL TAKE 1 TABLET BY MOUTH EVERY DAY   metFORMIN 500 MG tablet Commonly known as: GLUCOPHAGE TAKE 1 TABLET BY MOUTH EVERY MORNING AND 2 TABLETS BY MOUTH AT BEDTIME.   miconazole 2 % powder Commonly known as: MICOTIN Apply 1 application topically 2 (two) times  daily as needed for itching.   NovoLOG 100 UNIT/ML injection Generic drug: insulin aspart INJECT 20-25 UNITS INTO THE SKIN 3 TIMES A DAY BEFORE MEALS   pravastatin 40 MG tablet Commonly known as: PRAVACHOL Take 1 tablet (40 mg total) by mouth daily. TAKE 1 TABLET (40 MG TOTAL)  BY MOUTH DAILY.   predniSONE 20 MG tablet Commonly known as: DELTASONE 60 mg x3d, 40 mg x3d, 20 mg x2d, 10 mg x2d Start taking on: March 16, 2022 Started by: Howard Pouch, DO   PREVAGEN PO Take by mouth daily.   PROBIOTIC PO Take 1 capsule by mouth daily.   Symbicort 160-4.5 MCG/ACT inhaler Generic drug: budesonide-formoterol INHALE 2 PUFFS BY MOUTH TWICE A DAY   VITAMIN D PO Take by mouth.        All past medical history, surgical history, allergies, family history, immunizations andmedications were updated in the EMR today and reviewed under the history and medication portions of their EMR.     Review of Systems  Constitutional:  Positive for chills and malaise/fatigue. Negative for fever.  HENT:  Positive for congestion and sinus pain.   Respiratory:  Positive for cough, sputum production and wheezing.   Neurological:  Positive for headaches. Negative for dizziness.   Negative, with the exception of above mentioned in HPI   Objective:  BP (!) 144/68   Pulse (!) 102   Temp 98.3 F (36.8 C) (Oral)   Ht '5\' 1"'$  (1.549 m)   Wt 260 lb (117.9 kg)   LMP 08/10/2010 Comment: Hysterectomy 02/02/18  SpO2 97%   BMI 49.13 kg/m  Body mass index is 49.13 kg/m. Physical Exam Vitals and nursing note reviewed.  Constitutional:      General: She is not in acute distress.    Appearance: Normal appearance. She is not ill-appearing, toxic-appearing or diaphoretic.  HENT:     Head: Normocephalic and atraumatic.     Comments: TTP frontal sinus Eyes:     General: No scleral icterus.       Right eye: No discharge.        Left eye: No discharge.     Extraocular Movements: Extraocular movements intact.     Conjunctiva/sclera: Conjunctivae normal.     Pupils: Pupils are equal, round, and reactive to light.  Cardiovascular:     Rate and Rhythm: Normal rate and regular rhythm.     Heart sounds: No murmur heard. Pulmonary:     Effort: Pulmonary effort is normal. No respiratory  distress.     Breath sounds: Wheezing and rhonchi present. No rales.  Musculoskeletal:     Cervical back: Neck supple. Tenderness present.     Right lower leg: No edema.     Left lower leg: No edema.  Lymphadenopathy:     Cervical: Cervical adenopathy present.  Skin:    General: Skin is warm and dry.     Coloration: Skin is not jaundiced or pale.     Findings: No erythema or rash.  Neurological:     Mental Status: She is alert and oriented to person, place, and time. Mental status is at baseline.     Motor: No weakness.     Gait: Gait normal.  Psychiatric:        Mood and Affect: Mood normal.        Behavior: Behavior normal.        Thought Content: Thought content normal.        Judgment: Judgment normal.  No results found. No results found. Results for orders placed or performed in visit on 03/15/22 (from the past 24 hour(s))  POC COVID-19 BinaxNow     Status: None   Collection Time: 03/15/22  2:30 PM  Result Value Ref Range   SARS Coronavirus 2 Ag Negative Negative  POCT Influenza A/B     Status: None   Collection Time: 03/15/22  2:30 PM  Result Value Ref Range   Influenza A, POC Negative Negative   Influenza B, POC Negative Negative    Assessment/Plan: [name] is a 71 y.o. female present for OV for  Wheezing - POC COVID-19 BinaxNow> negative - POCT Influenza A/B>negative - ipratropium-albuterol (DUONEB) 0.5-2.5 (3) MG/3ML nebulizer solution 3 mL - methylPREDNISolone acetate (DEPO-MEDROL) injection 80 mg  Mild intermittent asthmatic bronchitis with acute exacerbation.\/Bacterial respiratory infection Rest, hydrate.  mucinex (DM if cough), nettie pot or nasal saline.  Doxy bid and pred (start tomorrow) prescribed, take until completed.  If cough present it can last up to 6-8 weeks.  F/U 2 weeks of not improved.    Reviewed expectations re: course of current medical issues. Discussed self-management of symptoms. Outlined signs and symptoms  indicating need for more acute intervention. Patient verbalized understanding and all questions were answered. Patient received an After-Visit Summary.    Orders Placed This Encounter  Procedures   POC COVID-19 BinaxNow   POCT Influenza A/B   Meds ordered this encounter  Medications   predniSONE (DELTASONE) 20 MG tablet    Sig: 60 mg x3d, 40 mg x3d, 20 mg x2d, 10 mg x2d    Dispense:  15 tablet    Refill:  0   doxycycline (VIBRA-TABS) 100 MG tablet    Sig: Take 1 tablet (100 mg total) by mouth 2 (two) times daily.    Dispense:  20 tablet    Refill:  0   ipratropium-albuterol (DUONEB) 0.5-2.5 (3) MG/3ML nebulizer solution 3 mL   methylPREDNISolone acetate (DEPO-MEDROL) injection 80 mg   benzonatate (TESSALON) 200 MG capsule    Sig: Take 1 capsule (200 mg total) by mouth 2 (two) times daily as needed for cough.    Dispense:  20 capsule    Refill:  0   Referral Orders  No referral(s) requested today     Note is dictated utilizing voice recognition software. Although note has been proof read prior to signing, occasional typographical errors still can be missed. If any questions arise, please do not hesitate to call for verification.   electronically signed by:  Howard Pouch, DO  [place]

## 2022-03-17 ENCOUNTER — Other Ambulatory Visit: Payer: Self-pay | Admitting: Family Medicine

## 2022-03-21 ENCOUNTER — Ambulatory Visit: Payer: Medicare Other | Admitting: Family Medicine

## 2022-03-24 ENCOUNTER — Other Ambulatory Visit: Payer: Self-pay | Admitting: Family Medicine

## 2022-03-29 ENCOUNTER — Ambulatory Visit: Payer: Medicare Other | Admitting: Family Medicine

## 2022-03-30 ENCOUNTER — Other Ambulatory Visit: Payer: Self-pay | Admitting: Family Medicine

## 2022-03-31 NOTE — Telephone Encounter (Signed)
Requesting:norco  Contract:02/02/21  UDS:02/02/21  Last Visit:12/17/21  Next Visit:12/11  Last Refill: 02/16/22 (90,0)   Please Advise

## 2022-04-04 MED ORDER — HYDROCODONE-ACETAMINOPHEN 5-325 MG PO TABS: ORAL_TABLET | ORAL | 0 refills | Status: DC

## 2022-04-08 ENCOUNTER — Other Ambulatory Visit: Payer: Self-pay | Admitting: Family Medicine

## 2022-04-10 ENCOUNTER — Other Ambulatory Visit: Payer: Self-pay | Admitting: Family Medicine

## 2022-05-04 ENCOUNTER — Telehealth: Payer: Self-pay | Admitting: Family Medicine

## 2022-05-04 NOTE — Telephone Encounter (Signed)
Left message for patient to schedule Annual Wellness Visit.  Please schedule(office,telephone/video call) with Nurse Health Advisor at [facility].  Please call [phone] ask for [facility]

## 2022-05-09 ENCOUNTER — Telehealth: Payer: Self-pay | Admitting: Family Medicine

## 2022-05-09 NOTE — Telephone Encounter (Signed)
Attempted to schedule AWV. Unable to LVM.  Will try at later time.  

## 2022-05-11 ENCOUNTER — Other Ambulatory Visit (HOSPITAL_COMMUNITY): Payer: Self-pay

## 2022-05-13 ENCOUNTER — Other Ambulatory Visit: Payer: Self-pay | Admitting: Family Medicine

## 2022-05-16 ENCOUNTER — Other Ambulatory Visit: Payer: Self-pay | Admitting: Internal Medicine

## 2022-05-17 ENCOUNTER — Other Ambulatory Visit: Payer: Self-pay | Admitting: Family Medicine

## 2022-05-18 MED ORDER — HYDROCODONE-ACETAMINOPHEN 5-325 MG PO TABS: ORAL_TABLET | ORAL | 0 refills | Status: DC

## 2022-05-18 NOTE — Telephone Encounter (Signed)
#  30 rx'd. Needs f/u chronic pain

## 2022-05-18 NOTE — Telephone Encounter (Signed)
Requesting:norco  Contract:02/02/21  UDS:02/02/21  Last Visit:12/17/21  Next Visit: overdue  Last Refill: 03/31/22 (90,0)   Please Advise

## 2022-05-19 ENCOUNTER — Telehealth: Payer: Self-pay

## 2022-05-19 NOTE — Telephone Encounter (Signed)
LVM for pt to call back in regards to scheduling AWV with our health coach.   05/19/2022'@currenttime'$ @

## 2022-05-23 ENCOUNTER — Other Ambulatory Visit: Payer: Self-pay | Admitting: Family Medicine

## 2022-05-28 ENCOUNTER — Other Ambulatory Visit: Payer: Self-pay | Admitting: Internal Medicine

## 2022-05-28 ENCOUNTER — Other Ambulatory Visit: Payer: Self-pay | Admitting: Family Medicine

## 2022-05-30 NOTE — Telephone Encounter (Signed)
Please assist with PA.

## 2022-06-01 ENCOUNTER — Other Ambulatory Visit (HOSPITAL_COMMUNITY): Payer: Self-pay

## 2022-06-01 ENCOUNTER — Telehealth: Payer: Self-pay | Admitting: Family Medicine

## 2022-06-01 NOTE — Telephone Encounter (Signed)
Pharmacy Patient Advocate Encounter  Received notification from [facility] that the request for prior authorization for Symbicort has been denied due to .    Please be advised we currently do not have a Pharmacist to review denials, therefore you will need to process appeals accordingly as needed. Thanks for your support at this time.   You may call [phone] or fax [phone], to appeal.

## 2022-06-01 NOTE — Telephone Encounter (Signed)
Contacted [name] to schedule their annual wellness visit. Appointment made for 06/08/2022.  [place] Direct Dial [phone]

## 2022-06-01 NOTE — Telephone Encounter (Signed)
Patient Advocate Encounter   Received notification from [facility] that prior authorization for Symbicort is required.   PA submitted on 06/01/2022 Key [id number] Status is pending

## 2022-06-02 ENCOUNTER — Ambulatory Visit: Payer: Medicare Other | Admitting: Internal Medicine

## 2022-06-02 MED ORDER — DULERA 50-5 MCG/ACT IN AERO: INHALATION_SPRAY | RESPIRATORY_TRACT | 1 refills | Status: DC

## 2022-06-02 NOTE — Telephone Encounter (Signed)
Dulera eRx'd

## 2022-06-02 NOTE — Telephone Encounter (Signed)
Please advise on alternative medication (Advair 45-45mg or Dulera 50-571m)

## 2022-06-08 ENCOUNTER — Ambulatory Visit (INDEPENDENT_AMBULATORY_CARE_PROVIDER_SITE_OTHER): Payer: Medicare Other

## 2022-06-08 ENCOUNTER — Other Ambulatory Visit: Payer: Self-pay | Admitting: Internal Medicine

## 2022-06-08 VITALS — Wt 260.0 lb

## 2022-06-08 DIAGNOSIS — Z0000 Encounter for general adult medical examination without abnormal findings: Secondary | ICD-10-CM | POA: Diagnosis not present

## 2022-06-08 NOTE — Progress Notes (Signed)
I connected with  [name] on 06/08/22 by a audio enabled telemedicine application and verified that I am speaking with the correct person using two identifiers.  Patient Location: Home  Provider Location: Home Office  I discussed the limitations of evaluation and management by telemedicine. The patient expressed understanding and agreed to proceed.   Subjective:   [name] is a 72 y.o. female who presents for Medicare Annual (Subsequent) preventive examination.  Review of Systems     Cardiac Risk Factors include: advanced age (>19mn, >>44women);diabetes mellitus;hypertension;dyslipidemia;obesity (BMI >30kg/m2);sedentary lifestyle     Objective:    Today's Vitals   06/08/22 1335  Weight: 260 lb (117.9 kg)   Body mass index is 49.13 kg/m.     06/08/2022    1:43 PM 12/23/2021   11:17 AM 08/13/2021   11:01 AM 05/05/2021    1:02 PM 12/17/2020   11:35 AM 06/15/2020    3:57 PM 04/29/2020    1:35 PM  Advanced Directives  Does Patient Have a Medical Advance Directive? Yes Yes Yes Yes Yes Yes Yes  Type of [place]aramed[place]of [place]St. [place] will   Healthcare Power of ABethel ManorLiving will HQueensLiving will  Does patient want to make changes to medical advance directive?  No - Patient declined No - Patient declined  No - Patient declined    Copy of [place] Chart? No - copy requested   No - copy requested  No - copy requested No - copy requested    Current Medications (verified) Outpatient Encounter Medications as of 06/08/2022  Medication Sig   albuterol (PROVENTIL) (2.5 MG/3ML) 0.083% nebulizer solution Take 3 mLs (2.5 mg total) by nebulization every 4 (four) hours as needed for wheezing or shortness of breath (Dx: J45.50).   albuterol (VENTOLIN HFA) 108 (90 Base) MCG/ACT inhaler INHALE 2 PUFFS INTO LUNGS EVERY 6 HOURS AS NEEDED FOR WHEEZING/SHORTNESS OF BREATH   amitriptyline  (ELAVIL) 25 MG tablet TAKE 2 TABLETS BY MOUTH EVERY NIGHT AT BEDTIME   Apoaequorin (PREVAGEN PO) Take by mouth daily.   clobetasol ointment (TEMOVATE) 0AB-123456789% Apply 1 Application topically as needed. Apply to the skin of the vulva for itching   Continuous Blood Gluc Receiver (FREESTYLE LIBRE 14 DAY READER) DEVI by Does not apply route.   diclofenac Sodium (VOLTAREN) 1 % GEL Apply 2 g topically 4 (four) times daily.   felodipine (PLENDIL) 10 MG 24 hr tablet TAKE 1 TABLET BY MOUTH EVERY DAY   Ferrous Sulfate (IRON PO) Take by mouth.   fluticasone (CUTIVATE) 0.05 % cream APPLY TO AFFECTED AREA OF RIGHT LOWER LEG TWICE PER DAY.   fluticasone (VERAMYST) 27.5 MCG/SPRAY nasal spray Place 1 spray into the nose daily as needed for rhinitis.   furosemide (LASIX) 20 MG tablet TAKE 2 TABLETS (40 MG TOTAL) BY MOUTH DAILY.   HYDROcodone-acetaminophen (NORCO/VICODIN) 5-325 MG tablet 1-2 tabs po bid prn pain   Insulin Syringes, Disposable, U-100 0.3 ML MISC Use to inject insulin--6 injections per day   levocetirizine (XYZAL) 5 MG tablet Take 5 mg by mouth at bedtime as needed for allergies.    levothyroxine (SYNTHROID) 125 MCG tablet TAKE 1 TABLET BY MOUTH EVERY DAY   lisinopril (ZESTRIL) 10 MG tablet TAKE 1 TABLET BY MOUTH EVERY DAY   metFORMIN (GLUCOPHAGE) 500 MG tablet TAKE 1 TABLET BY MOUTH EVERY MORNING AND 2 TABLETS BY MOUTH AT BEDTIME.   miconazole (MICOTIN) 2 %  powder Apply 1 application topically 2 (two) times daily as needed for itching.   Mometasone Furo-Formoterol Fum (DULERA) 50-5 MCG/ACT AERO 2 puffs bid   NOVOLIN N 100 UNIT/ML injection INJECT 30 UNITS TOTAL (0.3 ML) INTO THE SKIN 3 (THREE) TIMES DAILY BEFORE MEALS. (Patient taking differently: 20 Units. 35 at bedtime)   NOVOLOG 100 UNIT/ML injection INJECT 20-25 UNITS INTO THE SKIN 3 TIMES A DAY BEFORE MEALS   pravastatin (PRAVACHOL) 40 MG tablet TAKE 1 TABLET BY MOUTH EVERY DAY   Probiotic Product (PROBIOTIC PO) Take 1 capsule by mouth daily.    VITAMIN D PO Take by mouth.   [DISCONTINUED] benzonatate (TESSALON) 200 MG capsule Take 1 capsule (200 mg total) by mouth 2 (two) times daily as needed for cough.   [DISCONTINUED] diphenoxylate-atropine (LOMOTIL) 2.5-0.025 MG tablet 1-2 tabs po qid prn diarrhea   [DISCONTINUED] doxycycline (VIBRA-TABS) 100 MG tablet Take 1 tablet (100 mg total) by mouth 2 (two) times daily.   [DISCONTINUED] HYDROcodone-acetaminophen (NORCO) 10-325 MG tablet 1 tab po bid   [DISCONTINUED] predniSONE (DELTASONE) 20 MG tablet 60 mg x3d, 40 mg x3d, 20 mg x2d, 10 mg x2d   No facility-administered encounter medications on file as of 06/08/2022.    Allergies (verified) Adhesive [tape], Banana, Eggs or egg-derived products, Latex, Linzess [linaclotide], Ozempic (0.25 or 0.5 mg-dose) [semaglutide(0.25 or 0.'5mg'$ -dos)], Penicillins, Pine, and Rose   History: Past Medical History:  Diagnosis Date   Allergic rhinitis    Allergy testing: results pending as of 05/28/16 (Dr. Harold Hedge)   Anemia 10/2020   anemia of chronic dz (? + mild IDA?--iron started   Arthritis    Chemotherapy-induced neuropathy ([place]) 2019   Cough variant asthma    DDD (degenerative disc disease), lumbar    Diabetes mellitus with complication ([place])    Mild nonprolif DR R eye 12/2017-->referred to retinal specialist DM MANAGED BY DR. GHERGHE AS OF 2021   Endometrial cancer ([place]) 02/2018   REMISSION AS OF 08/2018.  Stage III (T3, Nx, Mx)  Path: endometroid adenocarcinoma involving cervix, uterus, and fallopian tubes (Pt got TAH & BSO 02/2018). No sign of recurrence as of onc f/u 05/2019.   GERD (gastroesophageal reflux disease)    Hemorrhoids    Herpes zoster 10/2015   L side belt-line   History of adenomatous polyp of colon 02/25/2013   5 mm cecal polyp removed by Dr. Trevor Mace 5 yrs   History of blood transfusion    History of cellulitis 08/2010   Left (Since replacemnt of Left knee   History of radiation therapy 05/21/2018   [facility]  brachytherapy 04/24/2018-05/21/2018  Dr Gery Pray   Hyperkalemia 03/2018   started after pt started getting chemo-->had to stop enalapril.   Hyperlipidemia    Not on statin b/c lipids "stayed down when sugars came down" per pt.  She says Dr. Chalmers Cater knows she is not on statin anymore.   Hypertension    Hypothyroidism    Nephrolithiasis 07/2005   Obesity    OSA on CPAP    Osteoarthritis    knees, ankle, + ? scapholunate ligament disruption (x-ray 06/2017)--ortho referral.   Pancytopenia due to antineoplastic chemotherapy ([place])    progressive as of 06/2018. Stable 08/2018.   Recurrent UTI    started cipro 250 qd 09/2018   Sciatica of left side    Severe persistent asthma    cough-variant--saw Allergist 05/25/16 and was switched from max dose advair to symbicort.   Systolic murmur    ECHO  fine 10/2016-->murmur likely flow murmur assoc with HTN.   Zoon's vulvitis    Bx-proven (GYN) -lichen sclerosis.  Clobetasol 0.05% ointment per GYN   Past Surgical History:  Procedure Laterality Date   ANKLE FRACTURE SURGERY  1984   Pin & repair   ARTHROSCOPIC REPAIR ACL  2000   Due to ACL tear   ARTHROSCOPIC REPAIR ACL  5/08   Blateral knee rerlacements x4 2 times each knee     BREAST BIOPSY Right 2014   Benign   CARDIAC CATHETERIZATION  5/07   clear vessel mild mitral    CARPAL TUNNEL RELEASE Right [id number]   1989 Left   CESAREAN SECTION  1985   CHOLECYSTECTOMY OPEN  1988   COLONOSCOPY N/A 02/25/2013   Tubular adenoma x 1: Recall 5 yrs. Procedure: COLONOSCOPY;  Surgeon: Juanita Craver, MD;  Location: [facility];  Service: Endoscopy;  Laterality: N/A;   COMBINED HYSTEROSCOPY DIAGNOSTIC / D&C  2/12   Bx neg   DEXA  08/2007; 05/12/20   Bone density normal 2009 and 2022.  Plan rpt 2024.   DILATION AND CURETTAGE OF UTERUS  12/11   IR IMAGING GUIDED PORT INSERTION  03/16/2018   IR REMOVAL TUN ACCESS W/ PORT W/O FL MOD SED  09/20/2018   LYMPH NODE BIOPSY N/A 02/19/2018   Procedure: Sentinel LYMPH NODE  BIOPSY;  Surgeon: Everitt Amber, MD;  Location: [facility];  Service: Gynecology;  Laterality: N/A;   REPLACEMENT TOTAL KNEE Left 5/12   X 2 on each   ROBOTIC ASSISTED TOTAL HYSTERECTOMY WITH BILATERAL SALPINGO OOPHERECTOMY N/A 02/19/2018   For endometrial cancer.  Procedure: XI ROBOTIC ASSISTED TOTAL HYSTERECTOMY WITH BILATERAL SALPINGO OOPHORECTOMY;  Surgeon: Everitt Amber, MD;  Location: [facility];  Service: Gynecology;  Laterality: N/A;   TRANSTHORACIC ECHOCARDIOGRAM  10/11/2016    EF 55-60%, grd I DD.   TUBAL LIGATION  1985   C-Section   Family History  Problem Relation Age of Onset   Diabetes Father    COPD Father    Stroke Father    Celiac disease Brother    Congenital heart disease Brother    Rheum arthritis Brother    Thyroid disease Brother    Diabetes Brother    Dementia Brother        Lewy Body   Alzheimer's disease Mother    Arthritis Mother    Other Son        Died age 46 -Tetrology of Fallot - VSD/pulmonary atresia   Breast cancer Other        postmenopausal when diagnosed   Social History   Socioeconomic History   Marital status: Married    Spouse name: Louie Casa   Number of children: 2   Years of education: Not on file   Highest education level: Bachelor's degree (e.g., BA, AB, BS)  Occupational History   Occupation: retired Therapist, sports  Tobacco Use   Smoking status: Never   Smokeless tobacco: Never  Vaping Use   Vaping Use: Never used  Substance and Sexual Activity   Alcohol use: No    Alcohol/week: 0.0 standard drinks of alcohol   Drug use: No   Sexual activity: Never    Partners: Male    Birth control/protection: I.U.D.  Other Topics Concern   Not on file  Social History Narrative   Not on file   Social Determinants of Health   Financial Resource Strain: Low Risk  (06/07/2022)   Overall Financial Resource Strain (CARDIA)  Difficulty of Paying Living Expenses: Not hard at all  Food Insecurity: No Food Insecurity  (06/07/2022)   Hunger Vital Sign    Worried About Running Out of Food in the Last Year: Never true    Ran Out of Food in the Last Year: Never true  Transportation Needs: No Transportation Needs (06/07/2022)   PRAPARE - Hydrologist (Medical): No    Lack of Transportation (Non-Medical): No  Physical Activity: Unknown (06/07/2022)   Exercise Vital Sign    Days of Exercise per Week: Patient refused    Minutes of Exercise per Session: Patient refused  Recent Concern: Physical Activity - Inactive (03/15/2022)   Exercise Vital Sign    Days of Exercise per Week: 0 days    Minutes of Exercise per Session: 0 min  Stress: No Stress Concern Present (06/07/2022)   [place]    Feeling of Stress : Not at all  Social Connections: [place] (06/07/2022)   Social Connection and Isolation Panel [NHANES]    Frequency of Communication with Friends and Family: More than three times a week    Frequency of Social Gatherings with Friends and Family: Once a week    Attends Religious Services: 1 to 4 times per year    Active Member of [employer] or Organizations: Yes    Attends Archivist Meetings: Patient refused    Marital Status: Married    Tobacco Counseling Counseling given: Not Answered   Clinical Intake:  Pre-visit preparation completed: Yes  Pain : No/denies pain     BMI - recorded: 49.13 Nutritional Status: BMI > 30  Obese Nutritional Risks: None Diabetes: Yes CBG done?: Yes (109 per pt) CBG resulted in Enter/ Edit results?: No Did pt. bring in CBG monitor from home?: No  How often do you need to have someone help you when you read instructions, pamphlets, or other written materials from your doctor or pharmacy?: 1 - Never  Diabetic?Nutrition Risk Assessment:  Has the patient had any N/V/D within the last 2 months?  No  Does the patient have any non-healing wounds?  No  Has  the patient had any unintentional weight loss or weight gain?  No   Diabetes:  Is the patient diabetic?  Yes  If diabetic, was a CBG obtained today?  Yes  Did the patient bring in their glucometer from home?  No  How often do you monitor your CBG's? Free style libre .   Financial Strains and Diabetes Management:  Are you having any financial strains with the device, your supplies or your medication? No .  Does the patient want to be seen by Chronic Care Management for management of their diabetes?  No  Would the patient like to be referred to a Nutritionist or for Diabetic Management?  No   Diabetic Exams:  Diabetic Eye Exam: Overdue for diabetic eye exam. Pt has been advised about the importance in completing this exam. Patient advised to call and schedule an eye exam. Diabetic Foot Exam: Completed 11/29/21   Interpreter Needed?: No  Information entered by :: Charlott Rakes, LPN   Activities of Daily Living    06/07/2022   11:56 PM  In your present state of health, do you have any difficulty performing the following activities:  Hearing? 0  Vision? 0  Difficulty concentrating or making decisions? 0  Walking or climbing stairs? 1  Dressing or bathing? 0  Doing errands, shopping?  0  Preparing Food and eating ? N  Using the Toilet? N  In the past six months, have you accidently leaked urine? Y  Comment wears a pad  Do you have problems with loss of bowel control? N  Managing your Medications? N  Managing your Finances? N  Housekeeping or managing your Housekeeping? N    Patient Care Team: Tammi Sou, MD as PCP - General (Family Medicine) Harold Hedge, Darrick Grinder, MD as Consulting Physician (Allergy and Immunology) Juanita Craver, MD as Consulting Physician (Gastroenterology) [place], Peter M, MD as Consulting Physician (Cardiology) Megan Salon, MD as Consulting Physician (Gynecology) Isabel Caprice, MD (Inactive) as Consulting Physician (Gynecologic  Oncology) Everitt Amber, MD as Consulting Physician (Gynecologic Oncology) Heath Lark, MD as Consulting Physician (Hematology and Oncology) Philemon Kingdom, MD as Consulting Physician (Endocrinology)  Indicate any recent Medical Services you may have received from other than [facility] providers in the past year (date may be approximate).     Assessment:   This is a routine wellness examination for [place].  Hearing/Vision screen Hearing Screening - Comments:: Pt denies any hearing issues  Vision Screening - Comments:: Pt follows up with eye care group for annual eye exams   Dietary issues and exercise activities discussed: Current Exercise Habits: The patient does not participate in regular exercise at present   Goals Addressed             This Visit's Progress    Patient Stated       Do a little more moving        Depression Screen    06/08/2022    1:42 PM 03/15/2022    1:36 PM 09/02/2021    1:29 PM 05/05/2021    1:01 PM 03/01/2021    3:42 PM 04/29/2020    1:39 PM 11/13/2019    3:04 PM  PHQ 2/9 Scores  PHQ - 2 Score 0 0 0 0 0 0 0    Fall Risk    06/07/2022   11:56 PM 03/15/2022    1:36 PM 03/15/2022    9:01 AM 09/02/2021    1:29 PM 05/05/2021    1:03 PM  [place] in the past year? '1 1 1 1 1  '$ Number falls in past yr: '1 1 1 1 1  '$ Injury with Fall? 0 0 0 0 1  Comment     sore  Risk for fall due to : Impaired balance/gait;Impaired vision;Impaired mobility History of fall(s)  Impaired balance/gait;Impaired vision Impaired balance/gait;Impaired mobility;Impaired vision  Risk for fall due to: Comment     at times  Follow up Falls prevention discussed Falls evaluation completed  Falls evaluation completed Falls prevention discussed    FALL RISK PREVENTION PERTAINING TO THE HOME:  Any stairs in or around the home? Yes  If so, are there any without handrails? No  Home free of loose throw rugs in walkways, pet beds, electrical cords, etc? Yes  Adequate lighting in your  home to reduce risk of falls? Yes   ASSISTIVE DEVICES UTILIZED TO PREVENT FALLS:  Life alert? yes Use of a cane, walker or w/c? Yes  Grab bars in the bathroom? No  Shower chair or bench in shower? Yes  Elevated toilet seat or a handicapped toilet? No   TIMED UP AND GO:  Was the test performed? No .  Cognitive Function:        06/08/2022    1:45 PM 05/05/2021    1:07 PM  6CIT Screen  What Year? 0 points 0 points  What month? 0 points 0 points  What time? 0 points 0 points  Count back from 20 0 points 0 points  Months in reverse 0 points 0 points  Repeat phrase 0 points 2 points  Total Score 0 points 2 points    Immunizations Immunization History  Administered Date(s) Administered   DT (Pediatric) 04/11/1992   Influenza Inj Mdck Quad Pf 12/18/2016   Influenza, Quadrivalent, Recombinant, Inj, Pf 12/28/2015, 12/30/2018   Influenza,trivalent, recombinat, inj, PF 03/20/2015   Influenza-Unspecified 01/08/2018, 01/15/2020   Moderna Sars-Covid-2 Vaccination 07/01/2019, 08/01/2019   Pneumococcal Conjugate-13 06/28/2017   Pneumococcal Polysaccharide-23 04/12/1999, 05/03/2016   Td 08/10/2006   Tdap 10/17/2016    TDAP status: Up to date  Flu Vaccine status: Due, Education has been provided regarding the importance of this vaccine. Advised may receive this vaccine at local pharmacy or Health Dept. Aware to provide a copy of the vaccination record if obtained from local pharmacy or Health Dept. Verbalized acceptance and understanding.  Pneumococcal vaccine status: Up to date  Covid-19 vaccine status: Completed vaccines  Qualifies for Shingles Vaccine? Yes   Zostavax completed No   Shingrix Completed?: No.    Education has been provided regarding the importance of this vaccine. Patient has been advised to call insurance company to determine out of pocket expense if they have not yet received this vaccine. Advised may also receive vaccine at local pharmacy or Health Dept.  Verbalized acceptance and understanding.  Screening Tests Health Maintenance  Topic Date Due   Diabetic kidney evaluation - Urine ACR  Never done   Zoster Vaccines- Shingrix (1 of 2) Never done   COVID-19 Vaccine (3 - Moderna risk series) 08/29/2019   MAMMOGRAM  05/12/2021   OPHTHALMOLOGY EXAM  01/05/2022   HEMOGLOBIN A1C  06/01/2022   INFLUENZA VACCINE  07/10/2022 (Originally 11/09/2021)   FOOT EXAM  11/30/2022   Diabetic kidney evaluation - eGFR measurement  01/01/2023   COLONOSCOPY (Pts 45-36yr Insurance coverage will need to be confirmed)  02/26/2023   Medicare Annual Wellness (AWV)  06/09/2023   DTaP/Tdap/Td (4 - Td or Tdap) 10/18/2026   Pneumonia Vaccine 72 Years old  Completed   DEXA SCAN  Completed   Hepatitis C Screening  Completed   HPV VACCINES  Aged Out   PAP SMEAR-Modifier  Discontinued    Health Maintenance  Health Maintenance Due  Topic Date Due   Diabetic kidney evaluation - Urine ACR  Never done   Zoster Vaccines- Shingrix (1 of 2) Never done   COVID-19 Vaccine (3 - Moderna risk series) 08/29/2019   MAMMOGRAM  05/12/2021   OPHTHALMOLOGY EXAM  01/05/2022   HEMOGLOBIN A1C  06/01/2022    Colorectal cancer screening: Type of screening: Colonoscopy. Completed 02/25/13. Repeat every 10 years  Mammogram status: Completed 05/12/20. Repeat every year  Bone Density status: Completed 05/12/20. Results reflect: Bone density results: NORMAL. Repeat every 2 years.  Additional Screening:  Hepatitis C Screening:  Completed 06/28/17  Vision Screening: Recommended annual ophthalmology exams for early detection of glaucoma and other disorders of the eye. Is the patient up to date with their annual eye exam?  No  Who is the provider or what is the name of the office in which the patient attends annual eye exams? [facility] in [place]  If pt is not established with a provider, would they like to be referred to a provider to establish care? No .   Dental  Screening:  Recommended annual dental exams for proper oral hygiene  Community Resource Referral / Chronic Care Management: CRR required this visit?  No   CCM required this visit?  No      Plan:     I have personally reviewed and noted the following in the patient's chart:   Medical and social history Use of alcohol, tobacco or illicit drugs  Current medications and supplements including opioid prescriptions. Patient is currently taking opioid prescriptions. Information provided to patient regarding non-opioid alternatives. Patient advised to discuss non-opioid treatment plan with their provider. Functional ability and status Nutritional status Physical activity Advanced directives List of other physicians Hospitalizations, surgeries, and ER visits in previous 12 months Vitals Screenings to include cognitive, depression, and falls Referrals and appointments  In addition, I have reviewed and discussed with patient certain preventive protocols, quality metrics, and best practice recommendations. A written personalized care plan for preventive services as well as general preventive health recommendations were provided to patient.     Willette Brace, LPN   [id number]   Nurse Notes: none

## 2022-06-08 NOTE — Patient Instructions (Signed)
[name] , Thank you for taking time to come for your Medicare Wellness Visit. I appreciate your ongoing commitment to your health goals. Please review the following plan we discussed and let me know if I can assist you in the future.   These are the goals we discussed:  Goals      Patient Stated     Increase activity as tolerated     Patient Stated     None at this time     Patient Stated     Do a little more moving         This is a list of the screening recommended for you and due dates:  Health Maintenance  Topic Date Due   Yearly kidney health urinalysis for diabetes  Never done   Zoster (Shingles) Vaccine (1 of 2) Never done   COVID-19 Vaccine (3 - Moderna risk series) 08/29/2019   Mammogram  05/12/2021   Eye exam for diabetics  01/05/2022   Hemoglobin A1C  06/01/2022   Flu Shot  07/10/2022*   Complete foot exam   11/30/2022   Yearly kidney function blood test for diabetes  01/01/2023   Colon Cancer Screening  02/26/2023   Medicare Annual Wellness Visit  06/09/2023   DTaP/Tdap/Td vaccine (4 - Td or Tdap) 10/18/2026   Pneumonia Vaccine  Completed   DEXA scan (bone density measurement)  Completed   Hepatitis C Screening: USPSTF Recommendation to screen - Ages 31-79 yo.  Completed   HPV Vaccine  Aged Out   Pap Smear  Discontinued  *Topic was postponed. The date shown is not the original due date.    Advanced directives: Please bring a copy of your health care power of attorney and living will to the office at your convenience.   Conditions/risks identified: move a little more   Next appointment: Follow up in one year for your annual wellness visit    Preventive Care 65 Years and Older, Female Preventive care refers to lifestyle choices and visits with your health care provider that can promote health and wellness. What does preventive care include? A yearly physical exam. This is also called an annual well check. Dental exams once or twice a year. Routine  eye exams. Ask your health care provider how often you should have your eyes checked. Personal lifestyle choices, including: Daily care of your teeth and gums. Regular physical activity. Eating a healthy diet. Avoiding tobacco and drug use. Limiting alcohol use. Practicing safe sex. Taking low-dose aspirin every day. Taking vitamin and mineral supplements as recommended by your health care provider. What happens during an annual well check? The services and screenings done by your health care provider during your annual well check will depend on your age, overall health, lifestyle risk factors, and family history of disease. Counseling  Your health care provider may ask you questions about your: Alcohol use. Tobacco use. Drug use. Emotional well-being. Home and relationship well-being. Sexual activity. Eating habits. History of falls. Memory and ability to understand (cognition). Work and work Statistician. Reproductive health. Screening  You may have the following tests or measurements: Height, weight, and BMI. Blood pressure. Lipid and cholesterol levels. These may be checked every 5 years, or more frequently if you are over 24 years old. Skin check. Lung cancer screening. You may have this screening every year starting at age 68 if you have a 30-pack-year history of smoking and currently smoke or have quit within the past 15 years. Fecal occult blood test (  FOBT) of the stool. You may have this test every year starting at age 6. Flexible sigmoidoscopy or colonoscopy. You may have a sigmoidoscopy every 5 years or a colonoscopy every 10 years starting at age 32. Hepatitis C blood test. Hepatitis B blood test. Sexually transmitted disease (STD) testing. Diabetes screening. This is done by checking your blood sugar (glucose) after you have not eaten for a while (fasting). You may have this done every 1-3 years. Bone density scan. This is done to screen for osteoporosis. You may  have this done starting at age 86. Mammogram. This may be done every 1-2 years. Talk to your health care provider about how often you should have regular mammograms. Talk with your health care provider about your test results, treatment options, and if necessary, the need for more tests. Vaccines  Your health care provider may recommend certain vaccines, such as: Influenza vaccine. This is recommended every year. Tetanus, diphtheria, and acellular pertussis (Tdap, Td) vaccine. You may need a Td booster every 10 years. Zoster vaccine. You may need this after age 46. Pneumococcal 13-valent conjugate (PCV13) vaccine. One dose is recommended after age 31. Pneumococcal polysaccharide (PPSV23) vaccine. One dose is recommended after age 39. Talk to your health care provider about which screenings and vaccines you need and how often you need them. This information is not intended to replace advice given to you by your health care provider. Make sure you discuss any questions you have with your health care provider. Document Released: 04/24/2015 Document Revised: 12/16/2015 Document Reviewed: 01/27/2015 Elsevier Interactive Patient Education  2017 [place] Prevention in the Home Falls can cause injuries. They can happen to people of all ages. There are many things you can do to make your home safe and to help prevent falls. What can I do on the outside of my home? Regularly fix the edges of walkways and driveways and fix any cracks. Remove anything that might make you trip as you walk through a door, such as a raised step or threshold. Trim any bushes or trees on the path to your home. Use bright outdoor lighting. Clear any walking paths of anything that might make someone trip, such as rocks or tools. Regularly check to see if handrails are loose or broken. Make sure that both sides of any steps have handrails. Any raised decks and porches should have guardrails on the edges. Have any  leaves, snow, or ice cleared regularly. Use sand or salt on walking paths during winter. Clean up any spills in your garage right away. This includes oil or grease spills. What can I do in the bathroom? Use night lights. Install grab bars by the toilet and in the tub and shower. Do not use towel bars as grab bars. Use non-skid mats or decals in the tub or shower. If you need to sit down in the shower, use a plastic, non-slip stool. Keep the floor dry. Clean up any water that spills on the floor as soon as it happens. Remove soap buildup in the tub or shower regularly. Attach bath mats securely with double-sided non-slip rug tape. Do not have throw rugs and other things on the floor that can make you trip. What can I do in the bedroom? Use night lights. Make sure that you have a light by your bed that is easy to reach. Do not use any sheets or blankets that are too big for your bed. They should not hang down onto the floor. Have a  firm chair that has side arms. You can use this for support while you get dressed. Do not have throw rugs and other things on the floor that can make you trip. What can I do in the kitchen? Clean up any spills right away. Avoid walking on wet floors. Keep items that you use a lot in easy-to-reach places. If you need to reach something above you, use a strong step stool that has a grab bar. Keep electrical cords out of the way. Do not use floor polish or wax that makes floors slippery. If you must use wax, use non-skid floor wax. Do not have throw rugs and other things on the floor that can make you trip. What can I do with my stairs? Do not leave any items on the stairs. Make sure that there are handrails on both sides of the stairs and use them. Fix handrails that are broken or loose. Make sure that handrails are as long as the stairways. Check any carpeting to make sure that it is firmly attached to the stairs. Fix any carpet that is loose or worn. Avoid  having throw rugs at the top or bottom of the stairs. If you do have throw rugs, attach them to the floor with carpet tape. Make sure that you have a light switch at the top of the stairs and the bottom of the stairs. If you do not have them, ask someone to add them for you. What else can I do to help prevent falls? Wear shoes that: Do not have high heels. Have rubber bottoms. Are comfortable and fit you well. Are closed at the toe. Do not wear sandals. If you use a stepladder: Make sure that it is fully opened. Do not climb a closed stepladder. Make sure that both sides of the stepladder are locked into place. Ask someone to hold it for you, if possible. Clearly mark and make sure that you can see: Any grab bars or handrails. First and last steps. Where the edge of each step is. Use tools that help you move around (mobility aids) if they are needed. These include: Canes. Walkers. Scooters. Crutches. Turn on the lights when you go into a dark area. Replace any light bulbs as soon as they burn out. Set up your furniture so you have a clear path. Avoid moving your furniture around. If any of your floors are uneven, fix them. If there are any pets around you, be aware of where they are. Review your medicines with your doctor. Some medicines can make you feel dizzy. This can increase your chance of falling. Ask your doctor what other things that you can do to help prevent falls. This information is not intended to replace advice given to you by your health care provider. Make sure you discuss any questions you have with your health care provider. Document Released: 01/22/2009 Document Revised: 09/03/2015 Document Reviewed: 05/02/2014 Elsevier Interactive Patient Education  2017 [employer].

## 2022-06-10 ENCOUNTER — Other Ambulatory Visit: Payer: Self-pay | Admitting: Internal Medicine

## 2022-06-10 MED ORDER — FIASP FLEXTOUCH 100 UNIT/ML ~~LOC~~ SOPN: 10.0000 [IU] | PEN_INJECTOR | Freq: Three times a day (TID) | SUBCUTANEOUS | 3 refills | Status: DC

## 2022-06-14 ENCOUNTER — Encounter: Payer: Self-pay | Admitting: Obstetrics & Gynecology

## 2022-06-15 ENCOUNTER — Inpatient Hospital Stay: Payer: Medicare Other | Attending: Obstetrics & Gynecology | Admitting: Obstetrics & Gynecology

## 2022-06-15 ENCOUNTER — Encounter: Payer: Self-pay | Admitting: Obstetrics & Gynecology

## 2022-06-15 VITALS — BP 153/45 | HR 95 | Temp 97.6°F | Resp 14 | Ht 61.0 in | Wt 261.4 lb

## 2022-06-15 DIAGNOSIS — Z9221 Personal history of antineoplastic chemotherapy: Secondary | ICD-10-CM | POA: Diagnosis not present

## 2022-06-15 DIAGNOSIS — Z8542 Personal history of malignant neoplasm of other parts of uterus: Secondary | ICD-10-CM | POA: Insufficient documentation

## 2022-06-15 DIAGNOSIS — Z923 Personal history of irradiation: Secondary | ICD-10-CM | POA: Insufficient documentation

## 2022-06-15 DIAGNOSIS — C541 Malignant neoplasm of endometrium: Secondary | ICD-10-CM

## 2022-06-15 DIAGNOSIS — B3731 Acute candidiasis of vulva and vagina: Secondary | ICD-10-CM | POA: Insufficient documentation

## 2022-06-15 MED ORDER — FLUCONAZOLE 150 MG PO TABS: 150.0000 mg | ORAL_TABLET | ORAL | 0 refills | Status: AC

## 2022-06-15 NOTE — Progress Notes (Signed)
Follow Up Note: Gyn-Onc  [name] 72 y.o. female  CC: [name] presents for a f/u visit   HPI: The oncology history was reviewed.  Interval History: [name] denies any vaginal bleeding, abdominal/pelvic pain, cough, lethargy or increasing abdominal girth. [name] was seen in f/u by [facility] in 9/23.  Clinically [name] was felt to be NED at that visit.   Review of Systems  Review of Systems  Constitutional:  Negative for malaise/fatigue and weight loss.  Respiratory:  Negative for shortness of breath and wheezing.   Cardiovascular:  Negative for chest pain and leg swelling.  Gastrointestinal:  Negative for abdominal pain, blood in stool, constipation, nausea and vomiting.  Genitourinary:  Negative for dysuria, frequency, hematuria and urgency.  Musculoskeletal:  Negative for joint pain and myalgias.  Neurological:  Negative for weakness.  Psychiatric/Behavioral:  Negative for depression. The patient does not have insomnia.    Current medications, allergy, social history, past surgical history, past medical history, family history were all reviewed.    Vitals: BP (!) 153/45 (BP Location: Right Arm, Patient Position: Sitting)   Pulse 95   Temp 97.6 F (36.4 C) (Tympanic)   Resp 14   Ht '5\' 1"'$  (1.549 m)   Wt 261 lb 6.4 oz (118.6 kg)   LMP 08/10/2010 Comment: Hysterectomy 02/02/18  SpO2 97%   BMI 49.39 kg/m    Physical Exam:  Physical Exam Exam conducted with a chaperone present.  Constitutional:      General: [name] is not in acute distress. Cardiovascular:     Rate and Rhythm: Normal rate and regular rhythm.  Pulmonary:     Effort: Pulmonary effort is normal.     Breath sounds: Normal breath sounds. No wheezing or rhonchi.  Abdominal:     Palpations: Abdomen is soft.     Tenderness: There is no abdominal tenderness. There is no right CVA tenderness or left CVA tenderness.     Hernia: No hernia is present.  Genitourinary:    General: white, waxy areas; labia minora w/large erosive  areas; thick, white discharge    Urethra: No urethral lesion.     Vagina: No lesions. No bleeding Musculoskeletal:     Cervical back: Neck supple.     Right lower leg: No edema.     Left lower leg: No edema.  Lymphadenopathy:     Upper Body:     Right upper body: No supraclavicular adenopathy.     Left upper body: No supraclavicular adenopathy.     Lower Body: No right inguinal adenopathy. No left inguinal adenopathy.  Skin:    Findings: ?dermatome, vessicles, surrounding eyrthema--right groin; erythematous plaque mons Neurological:     Mental Status: [name] is oriented to person, place, and time.   Assessment/Plan:  Endometrial cancer ([place]) 72 yo with a h/o Endometrial Cancer: stage IIIA grade 1 endometrioid (MMR normal, MSI stable) s/p staging surgery on 02/19/18. S/p adjuvant carboplatin and paclitaxel x 6 completed April, 2020. S/p adjuvant vaginal brachy completed February, 2020.     Negative symptom review  No evidence of recurrence  Exam suggestive of an acute, complicated VVC--h/o DM w/recent borderline glycemic control .  DDx includes a possible dystrophic process  >Treat empirically w/a course of Diflucan >Recommend ongoing 6 montly surveillance until April, 2025.  F/U w/[facility] in 6 mos; return in 1 yr     I personally spent 25 minutes face-to-face and non-face-to-face in the care of this patient, which includes all pre, intra, and post visit time on  the date of service.     Lahoma Crocker, MD

## 2022-06-15 NOTE — Assessment & Plan Note (Signed)
72 yo with a h/o Endometrial Cancer: stage IIIA grade 1 endometrioid (MMR normal, MSI stable) s/p staging surgery on 02/19/18. S/p adjuvant carboplatin and paclitaxel x 6 completed April, 2020. S/p adjuvant vaginal brachy completed February, 2020.     Negative symptom review, normal exam.  No evidence of recurrence   >Recommend ongoing 6 montly surveillance until April, 2025.  F/U w/[facility] in 6 mos; return in 1 yr

## 2022-06-15 NOTE — Patient Instructions (Signed)
Return prn or in 1 year

## 2022-06-16 ENCOUNTER — Ambulatory Visit (INDEPENDENT_AMBULATORY_CARE_PROVIDER_SITE_OTHER): Payer: Medicare Other | Admitting: Family Medicine

## 2022-06-16 ENCOUNTER — Encounter: Payer: Self-pay | Admitting: Family Medicine

## 2022-06-16 VITALS — BP 163/78 | HR 99 | Temp 98.2°F | Ht 61.0 in | Wt 259.6 lb

## 2022-06-16 DIAGNOSIS — J069 Acute upper respiratory infection, unspecified: Secondary | ICD-10-CM

## 2022-06-16 DIAGNOSIS — M159 Polyosteoarthritis, unspecified: Secondary | ICD-10-CM | POA: Diagnosis not present

## 2022-06-16 DIAGNOSIS — R21 Rash and other nonspecific skin eruption: Secondary | ICD-10-CM

## 2022-06-16 DIAGNOSIS — G894 Chronic pain syndrome: Secondary | ICD-10-CM | POA: Diagnosis not present

## 2022-06-16 DIAGNOSIS — Z79899 Other long term (current) drug therapy: Secondary | ICD-10-CM | POA: Diagnosis not present

## 2022-06-16 DIAGNOSIS — M792 Neuralgia and neuritis, unspecified: Secondary | ICD-10-CM | POA: Diagnosis not present

## 2022-06-16 MED ORDER — HYDROCODONE-ACETAMINOPHEN 5-325 MG PO TABS: ORAL_TABLET | ORAL | 0 refills | Status: DC

## 2022-06-16 MED ORDER — MUPIROCIN 2 % EX OINT: 1.0000 | TOPICAL_OINTMENT | Freq: Three times a day (TID) | CUTANEOUS | 0 refills | Status: DC

## 2022-06-16 NOTE — Progress Notes (Signed)
OFFICE VISIT  06/16/2022  CC:  Chief Complaint  Patient presents with   Rash    Located on stomach and side of right leg    Patient is a 72 y.o. female who presents for rash.  HPI: She has intertrigo rash under abdominal pannus chronically. At the periphery of this she has recently noted some mild increase in pain and some little bumps. She was not sure whether this was shingles or not so she came in for check. She has had shingles before in a different area of her body and states that this does not hurt nearly as bad.  Also here for follow-up chronic pain. Pain control good, takes 1 vicodin nightly, sometimes 1/2-1 additional tab. Indication for chronic opioid: osteoarthritis in hands, low back, feet, L hip, knees, wrists+chemotherapy -induced neuropathy (burning and numbness in feet). Takes 1-2 hydrocodone tabs a day typically.  Not NSAID candidate due to renal insufficiency. PMP AWARE reviewed today: most recent rx for Vicodin 5-3 25 was filled 05/18/2022, # 30, rx by me.  No red flags.  Reports a significant cough in the last week or so.  Has coughing fits, sometimes sounds like a bark.  Takes her inhaler and this helps only briefly.  She does not feel like she is wheezing or has chest tightness.  She does have nasal mucus and postnasal drip.  No fever, no shortness of breath.  Not taking any over-the-counter cough medication.  ROS as above, plus-->no dizziness, no HAs, no rashes, no melena/hematochezia.  No polyuria or polydipsia.   No focal weakness, paresthesias, or tremors.  No acute vision or hearing abnormalities.  No dysuria or unusual/new urinary urgency or frequency.  No recent changes in lower legs. No n/v/d or abd pain.  No palpitations.    Past Medical History:  Diagnosis Date   Allergic rhinitis    Allergy testing: results pending as of 05/28/16 (Dr. Harold Hedge)   Anemia 10/2020   anemia of chronic dz (? + mild IDA?--iron started   Arthritis    Chemotherapy-induced  neuropathy ([place]) 2019   Cough variant asthma    DDD (degenerative disc disease), lumbar    Diabetes mellitus with complication ([place])    Mild nonprolif DR R eye 12/2017-->referred to retinal specialist DM MANAGED BY DR. GHERGHE AS OF 2021   Endometrial cancer ([place]) 02/2018   REMISSION AS OF 08/2018.  Stage III (T3, Nx, Mx)  Path: endometroid adenocarcinoma involving cervix, uterus, and fallopian tubes (Pt got TAH & BSO 02/2018). No sign of recurrence as of onc f/u 05/2019.   GERD (gastroesophageal reflux disease)    Hemorrhoids    Herpes zoster 10/2015   L side belt-line   History of adenomatous polyp of colon 02/25/2013   5 mm cecal polyp removed by Dr. Trevor Mace 5 yrs   History of blood transfusion    History of cellulitis 08/2010   Left (Since replacemnt of Left knee   History of radiation therapy 05/21/2018   HDR [facility] brachytherapy 04/24/2018-05/21/2018  Dr Gery Pray   Hyperkalemia 03/2018   started after pt started getting chemo-->had to stop enalapril.   Hyperlipidemia    Not on statin b/c lipids "stayed down when sugars came down" per pt.  She says Dr. Chalmers Cater knows she is not on statin anymore.   Hypertension    Hypothyroidism    Nephrolithiasis 07/2005   Obesity    OSA on CPAP    Osteoarthritis    knees, ankle, + ? scapholunate ligament  disruption (x-ray 06/2017)--ortho referral.   Pancytopenia due to antineoplastic chemotherapy ([place])    progressive as of 06/2018. Stable 08/2018.   Recurrent UTI    started cipro 250 qd 09/2018   Sciatica of left side    Severe persistent asthma    cough-variant--saw Allergist 05/25/16 and was switched from max dose advair to symbicort.   Systolic murmur    ECHO fine 10/2016-->murmur likely flow murmur assoc with HTN.   Zoon's vulvitis    Bx-proven (GYN) -lichen sclerosis.  Clobetasol 0.05% ointment per GYN    Past Surgical History:  Procedure Laterality Date   ANKLE FRACTURE SURGERY  1984   Pin & repair   ARTHROSCOPIC REPAIR ACL  2000    Due to ACL tear   ARTHROSCOPIC REPAIR ACL  5/08   Blateral knee rerlacements x4 2 times each knee     BREAST BIOPSY Right 2014   Benign   CARDIAC CATHETERIZATION  5/07   clear vessel mild mitral    CARPAL TUNNEL RELEASE Right [id number]   1989 Left   CESAREAN SECTION  1985   CHOLECYSTECTOMY OPEN  1988   COLONOSCOPY N/A 02/25/2013   Tubular adenoma x 1: Recall 5 yrs. Procedure: COLONOSCOPY;  Surgeon: Juanita Craver, MD;  Location: [facility];  Service: Endoscopy;  Laterality: N/A;   COMBINED HYSTEROSCOPY DIAGNOSTIC / D&C  2/12   Bx neg   DEXA  08/2007; 05/12/20   Bone density normal 2009 and 2022.  Plan rpt 2024.   DILATION AND CURETTAGE OF UTERUS  12/11   IR IMAGING GUIDED PORT INSERTION  03/16/2018   IR REMOVAL TUN ACCESS W/ PORT W/O FL MOD SED  09/20/2018   LYMPH NODE BIOPSY N/A 02/19/2018   Procedure: Sentinel LYMPH NODE BIOPSY;  Surgeon: Everitt Amber, MD;  Location: [facility];  Service: Gynecology;  Laterality: N/A;   REPLACEMENT TOTAL KNEE Left 5/12   X 2 on each   ROBOTIC ASSISTED TOTAL HYSTERECTOMY WITH BILATERAL SALPINGO OOPHERECTOMY N/A 02/19/2018   For endometrial cancer.  Procedure: XI ROBOTIC ASSISTED TOTAL HYSTERECTOMY WITH BILATERAL SALPINGO OOPHORECTOMY;  Surgeon: Everitt Amber, MD;  Location: [place] [facility];  Service: Gynecology;  Laterality: N/A;   TRANSTHORACIC ECHOCARDIOGRAM  10/11/2016    EF 55-60%, grd I DD.   TUBAL LIGATION  1985   C-Section    Outpatient Medications Prior to Visit  Medication Sig Dispense Refill   albuterol (PROVENTIL) (2.5 MG/3ML) 0.083% nebulizer solution Take 3 mLs (2.5 mg total) by nebulization every 4 (four) hours as needed for wheezing or shortness of breath (Dx: J45.50). 75 mL 1   albuterol (VENTOLIN HFA) 108 (90 Base) MCG/ACT inhaler INHALE 2 PUFFS INTO LUNGS EVERY 6 HOURS AS NEEDED FOR WHEEZING/SHORTNESS OF BREATH 18 each 1   amitriptyline (ELAVIL) 25 MG tablet TAKE 2 TABLETS BY MOUTH EVERY NIGHT AT BEDTIME 60  tablet 0   Apoaequorin (PREVAGEN PO) Take by mouth daily.     clobetasol ointment (TEMOVATE) [id number] % Apply 1 Application topically as needed. Apply to the skin of the vulva for itching 30 g 2   Continuous Blood Gluc Receiver (FREESTYLE LIBRE 14 DAY READER) DEVI by Does not apply route.     diclofenac Sodium (VOLTAREN) 1 % GEL Apply 2 g topically 4 (four) times daily. 100 g 3   felodipine (PLENDIL) 10 MG 24 hr tablet TAKE 1 TABLET BY MOUTH EVERY DAY 90 tablet 1   Ferrous Sulfate (IRON PO) Take by mouth.     fluconazole (  DIFLUCAN) 150 MG tablet Take 1 tablet (150 mg total) by mouth every 3 (three) days for 3 doses. 3 tablet 0   fluticasone (CUTIVATE) 0.05 % cream APPLY TO AFFECTED AREA OF RIGHT LOWER LEG TWICE PER DAY. 60 g 1   fluticasone (VERAMYST) 27.5 MCG/SPRAY nasal spray Place 1 spray into the nose daily as needed for rhinitis.     furosemide (LASIX) 20 MG tablet TAKE 2 TABLETS (40 MG TOTAL) BY MOUTH DAILY. 180 tablet 0   Insulin Syringes, Disposable, U-100 0.3 ML MISC Use to inject insulin--6 injections per day 540 each 3   levocetirizine (XYZAL) 5 MG tablet Take 5 mg by mouth at bedtime as needed for allergies.   5   levothyroxine (SYNTHROID) 125 MCG tablet TAKE 1 TABLET BY MOUTH EVERY DAY 90 tablet 0   lisinopril (ZESTRIL) 10 MG tablet TAKE 1 TABLET BY MOUTH EVERY DAY 90 tablet 1   metFORMIN (GLUCOPHAGE) 500 MG tablet TAKE 1 TABLET BY MOUTH EVERY MORNING AND 2 TABLETS BY MOUTH AT BEDTIME. 270 tablet 0   miconazole (MICOTIN) 2 % powder Apply 1 application topically 2 (two) times daily as needed for itching.     NOVOLOG 100 UNIT/ML injection INJECT 20-25 UNITS INTO THE SKIN 3 TIMES A DAY BEFORE MEALS 30 mL 5   pravastatin (PRAVACHOL) 40 MG tablet TAKE 1 TABLET BY MOUTH EVERY DAY 30 tablet 0   Probiotic Product (PROBIOTIC PO) Take 1 capsule by mouth daily.     VITAMIN D PO Take by mouth.     HYDROcodone-acetaminophen (NORCO/VICODIN) 5-325 MG tablet 1-2 tabs po bid prn pain 30 tablet 0    insulin aspart (FIASP FLEXTOUCH) 100 UNIT/ML FlexTouch Pen Inject 10-20 Units into the skin 3 (three) times daily before meals. (Patient not taking: Reported on 06/16/2022) 45 mL 3   Mometasone Furo-Formoterol Fum (DULERA) 50-5 MCG/ACT AERO 2 puffs bid (Patient not taking: Reported on 06/16/2022) 3 each 1   No facility-administered medications prior to visit.    Allergies  Allergen Reactions   Adhesive [Tape] Other (See Comments)    Takes patient's skin off.   Banana Diarrhea and Nausea Only   Egg-Derived Products Diarrhea and Nausea Only   Latex Other (See Comments)    sensativity   Linzess [Linaclotide] Diarrhea   Ozempic (0.25 Or 0.5 Mg-Dose) [Semaglutide(0.25 Or 0.'5mg'$ -Dos)] Diarrhea   Penicillins Rash and Other (See Comments)    Has had cephalosporins without incident   Pine Swelling and Rash   Rose Swelling and Rash    Review of Systems  As per HPI  PE:    06/16/2022    1:54 PM 06/15/2022   11:25 AM 06/08/2022    1:35 PM  Vitals with BMI  Height '5\' 1"'$  '5\' 1"'$    Weight 259 lbs 10 oz 261 lbs 6 oz 260 lbs  BMI [id number] 0000000   Systolic [id number] 0000000   Diastolic 78 45   Pulse 99 95     Physical Exam  Gen: Alert, well appearing.  Patient is oriented to person, place, time, and situation. VH:[id number]: no injection, icteris, swelling, or exudate.  EOMI, PERRLA. Mouth: lips without lesion/swelling.  Oral mucosa pink and moist. Oropharynx without erythema, exudate, or swelling.  CV: RRR, no m/r/g.   LUNGS: CTA bilat, nonlabored resps, good aeration in all lung fields. Skin: Under abdominal pannus she has hyperpigmented macular rash with well-demarcated borders.  At the superior border there are some scattered papulo-pustules.  LABS:  Last CBC Lab Results  Component Value Date   WBC 8.9 02/02/2021   HGB 11.5 (L) 02/02/2021   HCT 34.9 (L) 02/02/2021   MCV 94.4 02/02/2021   MCH 34.6 (H) 09/20/2018   RDW 15.7 (H) 02/02/2021   PLT 224.0 [id number]   Last metabolic panel Lab Results   Component Value Date   GLUCOSE 95 12/31/2021   NA 140 12/31/2021   K 5.0 12/31/2021   CL 103 12/31/2021   CO2 28 12/31/2021   BUN 46 (H) 12/31/2021   CREATININE 0.99 12/31/2021   GFRNONAA >60 06/16/2020   CALCIUM 9.4 12/31/2021   PROT 7.1 09/02/2021   ALBUMIN 3.8 09/02/2021   BILITOT 0.2 09/02/2021   ALKPHOS 58 09/02/2021   AST 18 09/02/2021   ALT 16 09/02/2021   ANIONGAP 10 06/16/2020   Last hemoglobin A1c Lab Results  Component Value Date   HGBA1C 6.1 (A) 11/29/2021   IMPRESSION AND PLAN:  #1 rash of abdominal pannus. She has intertrigo and now some secondary bacterial pustules.  Low suspicion of zoster. Bactroban ointment 3 times daily prescribed.  2.  URI with cough and congestion. Viral etiology suspected.  No sign of acute asthma exacerbation. Mucinex DM recommended.  3.  Chronic pain syndrome: Osteoarthritis multiple sites, also neuropathic pain. Stable. Vicodin 5-325, 1 twice daily as needed, #90.  An After Visit Summary was printed and given to the patient.  FOLLOW UP: Return for 10-14d f/u rash.   Signed:  Crissie Sickles, MD           06/16/2022

## 2022-06-16 NOTE — Patient Instructions (Signed)
Take mucinex dm (OTC) for your cough

## 2022-06-21 ENCOUNTER — Ambulatory Visit: Payer: Medicare Other | Admitting: Internal Medicine

## 2022-06-21 ENCOUNTER — Other Ambulatory Visit: Payer: Self-pay | Admitting: Family Medicine

## 2022-06-22 ENCOUNTER — Other Ambulatory Visit: Payer: Self-pay | Admitting: Family Medicine

## 2022-06-22 ENCOUNTER — Other Ambulatory Visit: Payer: Self-pay | Admitting: Internal Medicine

## 2022-06-22 MED ORDER — PRAVASTATIN SODIUM 40 MG PO TABS: 40.0000 mg | ORAL_TABLET | Freq: Every day | ORAL | 1 refills | Status: DC

## 2022-06-22 MED ORDER — AMITRIPTYLINE HCL 25 MG PO TABS: 50.0000 mg | ORAL_TABLET | Freq: Every day | ORAL | 1 refills | Status: DC

## 2022-06-22 NOTE — Telephone Encounter (Signed)
Please advise. Pt has not had recent lipid panel in the last 6 months.

## 2022-06-23 MED ORDER — METFORMIN HCL 500 MG PO TABS: ORAL_TABLET | ORAL | 0 refills | Status: DC

## 2022-06-23 NOTE — Telephone Encounter (Signed)
Pt advised refill sent. °

## 2022-06-29 ENCOUNTER — Encounter: Payer: Self-pay | Admitting: Family Medicine

## 2022-06-29 ENCOUNTER — Ambulatory Visit (INDEPENDENT_AMBULATORY_CARE_PROVIDER_SITE_OTHER): Payer: Medicare Other | Admitting: Family Medicine

## 2022-06-29 VITALS — BP 137/68 | HR 94 | Temp 97.9°F | Ht 61.0 in | Wt 262.4 lb

## 2022-06-29 DIAGNOSIS — L304 Erythema intertrigo: Secondary | ICD-10-CM | POA: Diagnosis not present

## 2022-06-29 DIAGNOSIS — R21 Rash and other nonspecific skin eruption: Secondary | ICD-10-CM

## 2022-06-29 NOTE — Progress Notes (Signed)
OFFICE VISIT  06/29/2022  CC:  Chief Complaint  Patient presents with   Rash    Follow up; pt states it is better and decreased itching    Patient is a 72 y.o. female who presents for 2 week f/u rash. A/P as of last visit: "#1 rash of abdominal pannus. She has intertrigo and now some secondary bacterial pustules.  Low suspicion of zoster. Bactroban ointment 3 times daily prescribed.   2.  URI with cough and congestion. Viral etiology suspected.  No sign of acute asthma exacerbation. Mucinex DM recommended.   3.  Chronic pain syndrome: Osteoarthritis multiple sites, also neuropathic pain. Stable. Vicodin 5-325, 1 twice daily as needed, #90."  INTERIM HX: Rash is improved.  Less itching. The area of red bumps is gone.  Her cough is much improved.  Past Medical History:  Diagnosis Date   Allergic rhinitis    Allergy testing: results pending as of 05/28/16 (Dr. Harold Hedge)   Anemia 10/2020   anemia of chronic dz (? + mild IDA?--iron started   Arthritis    Chemotherapy-induced neuropathy ([place]) 2019   Cough variant asthma    DDD (degenerative disc disease), lumbar    Diabetes mellitus with complication ([place])    Mild nonprolif DR R eye 12/2017-->referred to retinal specialist DM MANAGED BY DR. GHERGHE AS OF 2021   Endometrial cancer ([place]) 02/2018   REMISSION AS OF 08/2018.  Stage III (T3, Nx, Mx)  Path: endometroid adenocarcinoma involving cervix, uterus, and fallopian tubes (Pt got TAH & BSO 02/2018). No sign of recurrence as of onc f/u 05/2019.   GERD (gastroesophageal reflux disease)    Hemorrhoids    Herpes zoster 10/2015   L side belt-line   History of adenomatous polyp of colon 02/25/2013   5 mm cecal polyp removed by Dr. Trevor Mace 5 yrs   History of blood transfusion    History of cellulitis 08/2010   Left (Since replacemnt of Left knee   History of radiation therapy 05/21/2018   HDR [facility] brachytherapy 04/24/2018-05/21/2018  Dr Gery Pray   Hyperkalemia 03/2018    started after pt started getting chemo-->had to stop enalapril.   Hyperlipidemia    Not on statin b/c lipids "stayed down when sugars came down" per pt.  She says Dr. Chalmers Cater knows she is not on statin anymore.   Hypertension    Hypothyroidism    Nephrolithiasis 07/2005   Obesity    OSA on CPAP    Osteoarthritis    knees, ankle, + ? scapholunate ligament disruption (x-ray 06/2017)--ortho referral.   Pancytopenia due to antineoplastic chemotherapy ([place])    progressive as of 06/2018. Stable 08/2018.   Recurrent UTI    started cipro 250 qd 09/2018   Sciatica of left side    Severe persistent asthma    cough-variant--saw Allergist 05/25/16 and was switched from max dose advair to symbicort.   Systolic murmur    ECHO fine 10/2016-->murmur likely flow murmur assoc with HTN.   Zoon's vulvitis    Bx-proven (GYN) -lichen sclerosis.  Clobetasol 0.05% ointment per GYN    Past Surgical History:  Procedure Laterality Date   ANKLE FRACTURE SURGERY  1984   Pin & repair   ARTHROSCOPIC REPAIR ACL  2000   Due to ACL tear   ARTHROSCOPIC REPAIR ACL  5/08   Blateral knee rerlacements x4 2 times each knee     BREAST BIOPSY Right 2014   Benign   CARDIAC CATHETERIZATION  5/07   clear  vessel mild mitral    CARPAL TUNNEL RELEASE Right 1984;1989   1989 Left   CESAREAN SECTION  1985   CHOLECYSTECTOMY OPEN  1988   COLONOSCOPY N/A 02/25/2013   Tubular adenoma x 1: Recall 5 yrs. Procedure: COLONOSCOPY;  Surgeon: Juanita Craver, MD;  Location: [facility];  Service: Endoscopy;  Laterality: N/A;   COMBINED HYSTEROSCOPY DIAGNOSTIC / D&C  2/12   Bx neg   DEXA  08/2007; 05/12/20   Bone density normal 2009 and 2022.  Plan rpt 2024.   DILATION AND CURETTAGE OF UTERUS  12/11   IR IMAGING GUIDED PORT INSERTION  03/16/2018   IR REMOVAL TUN ACCESS W/ PORT W/O FL MOD SED  09/20/2018   LYMPH NODE BIOPSY N/A 02/19/2018   Procedure: Sentinel LYMPH NODE BIOPSY;  Surgeon: Everitt Amber, MD;  Location[facility];   Service: Gynecology;  Laterality: N/A;   REPLACEMENT TOTAL KNEE Left 5/12   X 2 on each   ROBOTIC ASSISTED TOTAL HYSTERECTOMY WITH BILATERAL SALPINGO OOPHERECTOMY N/A 02/19/2018   For endometrial cancer.  Procedure: XI ROBOTIC ASSISTED TOTAL HYSTERECTOMY WITH BILATERAL SALPINGO OOPHORECTOMY;  Surgeon: Everitt Amber, MD;  Location: [place];  Service: Gynecology;  Laterality: N/A;   TRANSTHORACIC ECHOCARDIOGRAM  10/11/2016    EF 55-60%, grd I DD.   TUBAL LIGATION  1985   C-Section    Outpatient Medications Prior to Visit  Medication Sig Dispense Refill   albuterol (PROVENTIL) (2.5 MG/3ML) 0.083% nebulizer solution Take 3 mLs (2.5 mg total) by nebulization every 4 (four) hours as needed for wheezing or shortness of breath (Dx: J45.50). 75 mL 1   albuterol (VENTOLIN HFA) 108 (90 Base) MCG/ACT inhaler INHALE 2 PUFFS INTO LUNGS EVERY 6 HOURS AS NEEDED FOR WHEEZING/SHORTNESS OF BREATH 18 each 1   amitriptyline (ELAVIL) 25 MG tablet Take 2 tablets (50 mg total) by mouth at bedtime. 180 tablet 1   Apoaequorin (PREVAGEN PO) Take by mouth daily.     clobetasol ointment (TEMOVATE) [id number] % Apply 1 Application topically as needed. Apply to the skin of the vulva for itching 30 g 2   Continuous Blood Gluc Receiver (FREESTYLE LIBRE 14 DAY READER) DEVI by Does not apply route.     diclofenac Sodium (VOLTAREN) 1 % GEL Apply 2 g topically 4 (four) times daily. 100 g 3   felodipine (PLENDIL) 10 MG 24 hr tablet TAKE 1 TABLET BY MOUTH EVERY DAY 90 tablet 1   Ferrous Sulfate (IRON PO) Take by mouth.     fluticasone (CUTIVATE) 0.05 % cream APPLY TO AFFECTED AREA OF RIGHT LOWER LEG TWICE PER DAY. 60 g 1   fluticasone (VERAMYST) 27.5 MCG/SPRAY nasal spray Place 1 spray into the nose daily as needed for rhinitis.     furosemide (LASIX) 20 MG tablet TAKE 2 TABLETS (40 MG TOTAL) BY MOUTH DAILY. 180 tablet 0   HYDROcodone-acetaminophen (NORCO/VICODIN) 5-325 MG tablet 1-2 tabs po bid prn pain 90 tablet 0    Insulin Syringes, Disposable, U-100 0.3 ML MISC Use to inject insulin--6 injections per day 540 each 3   levocetirizine (XYZAL) 5 MG tablet Take 5 mg by mouth at bedtime as needed for allergies.   5   levothyroxine (SYNTHROID) 125 MCG tablet TAKE 1 TABLET BY MOUTH EVERY DAY 90 tablet 0   lisinopril (ZESTRIL) 10 MG tablet TAKE 1 TABLET BY MOUTH EVERY DAY 90 tablet 1   metFORMIN (GLUCOPHAGE) 500 MG tablet TAKE 1 TABLET BY MOUTH EVERY MORNING AND 2  TABLETS BY MOUTH AT BEDTIME. Strength: 500 mg 270 tablet 0   miconazole (MICOTIN) 2 % powder Apply 1 application topically 2 (two) times daily as needed for itching.     mupirocin ointment (BACTROBAN) 2 % Apply 1 Application topically 3 (three) times daily. Apply to affected areas tid x 10d 15 g 0   NOVOLOG 100 UNIT/ML injection INJECT 20-25 UNITS INTO THE SKIN 3 TIMES A DAY BEFORE MEALS 30 mL 5   pravastatin (PRAVACHOL) 40 MG tablet Take 1 tablet (40 mg total) by mouth daily. 90 tablet 1   Probiotic Product (PROBIOTIC PO) Take 1 capsule by mouth daily.     VITAMIN D PO Take by mouth.     insulin aspart (FIASP FLEXTOUCH) 100 UNIT/ML FlexTouch Pen Inject 10-20 Units into the skin 3 (three) times daily before meals. (Patient not taking: Reported on 06/16/2022) 45 mL 3   Mometasone Furo-Formoterol Fum (DULERA) 50-5 MCG/ACT AERO 2 puffs bid (Patient not taking: Reported on 06/16/2022) 3 each 1   No facility-administered medications prior to visit.    Allergies  Allergen Reactions   Adhesive [Tape] Other (See Comments)    Takes patient's skin off.   Banana Diarrhea and Nausea Only   Egg-Derived Products Diarrhea and Nausea Only   Latex Other (See Comments)    sensativity   Linzess [Linaclotide] Diarrhea   Ozempic (0.25 Or 0.5 Mg-Dose) [Semaglutide(0.25 Or 0.5mg -Dos)] Diarrhea   Penicillins Rash and Other (See Comments)    Has had cephalosporins without incident   Pine Swelling and Rash   Rose Swelling and Rash    Review of Systems As per  HPI  PE:    06/29/2022    3:00 PM 06/16/2022    1:54 PM 06/15/2022   11:25 AM  Vitals with BMI  Height 5\' 1"  5\' 1"  5\' 1"   Weight 262 lbs 6 oz 259 lbs 10 oz 261 lbs 6 oz  BMI 49.61 [id number] 0000000  Systolic 0000000 [id number] 0000000  Diastolic 68 78 45  Pulse 94 99 95     Physical Exam  Gen: Alert, well appearing.  Patient is oriented to person, place, time, and situation. SKIN: Under abdominal pannus and in groin she has hyperpigmented macular rash with well-demarcated borders.  No papules or pustules or areas of skin breakdown.  LABS:  None  IMPRESSION AND PLAN:  Intertrigo, recent small area of bacterial folliculitis. Everything is improved.  We discussed using antifungal powder regularly and trying to keep the area as cool and dry as possible.  An After Visit Summary was printed and given to the patient.  FOLLOW UP: Return in about 3 months (around 09/29/2022) for routine chronic illness f/u.  Signed:  Crissie Sickles, MD           06/29/2022

## 2022-07-12 ENCOUNTER — Telehealth: Payer: Self-pay | Admitting: Pharmacy Technician

## 2022-07-12 NOTE — Telephone Encounter (Signed)
Pharmacy Patient Advocate Encounter  Received notification from [place]. that documentation is required/requested in order to provide supplies.   Unfortunately she hasn't been seen in the last 6 months.

## 2022-07-18 ENCOUNTER — Encounter: Payer: Self-pay | Admitting: Internal Medicine

## 2022-07-18 ENCOUNTER — Ambulatory Visit (INDEPENDENT_AMBULATORY_CARE_PROVIDER_SITE_OTHER): Payer: Medicare Other | Admitting: Internal Medicine

## 2022-07-18 VITALS — BP 140/68 | HR 100 | Ht 61.0 in | Wt 261.0 lb

## 2022-07-18 DIAGNOSIS — E1142 Type 2 diabetes mellitus with diabetic polyneuropathy: Secondary | ICD-10-CM | POA: Diagnosis not present

## 2022-07-18 DIAGNOSIS — E1165 Type 2 diabetes mellitus with hyperglycemia: Secondary | ICD-10-CM

## 2022-07-18 LAB — POCT GLYCOSYLATED HEMOGLOBIN (HGB A1C): Hemoglobin A1C: 6.6 % — AB (ref 4.0–5.6)

## 2022-07-18 MED ORDER — INSULIN ASPART 100 UNIT/ML IJ SOLN: INTRAMUSCULAR | 5 refills | Status: DC

## 2022-07-18 MED ORDER — METFORMIN HCL 500 MG PO TABS: ORAL_TABLET | ORAL | 3 refills | Status: DC

## 2022-07-18 NOTE — Telephone Encounter (Signed)
Specialty pharmacy called requesting clinical notes be faxed to [phone]. Pt has not been seen in over 6 months. No recent notes to send. Pt ID: [id number].

## 2022-07-18 NOTE — Patient Instructions (Signed)
Please continue: - Metformin 500 mg in am and 1000 mg with dinner  Try to change: Insulin Before breakfast Before lunch Before dinner Bedtime  Novolog 18-20 10-15 15-20 -  NPH 20 15 - 35   Please come back for a follow-up appointment in 4-6 months.

## 2022-07-18 NOTE — Progress Notes (Signed)
ID: [name], female   DOB: Jan 12, 1951, 72 y.o.   MRN: [id number]   HPI: [name] is a 72 y.o.-year-old female, initially referred by her PCP, Dr. Marvel Peters, returning for follow-up for DM2, dx in 1992, insulin-dependent since 2004-2005, uncontrolled, with complications (DR).  Last visit 8 months ago.   On [employer].  Interim history: No increased urination, blurry vision, nausea, chest pain. She had URI and bronchitis. She was ABx and steroids. Sugars were higher.   Reviewed HbA1c levels: Lab Results  Component Value Date   HGBA1C 6.1 (A) 11/29/2021   HGBA1C 6.4 (A) 07/26/2021   HGBA1C 6.6 (A) 03/08/2021   HGBA1C 6.3 (A) 09/01/2020   HGBA1C 9.1 (A) 05/15/2020   HGBA1C 7.7 (A) 12/26/2019   HGBA1C 6.6 (A) 05/07/2019   HGBA1C 8.8 (H) 01/21/2019   HGBA1C 7.5 (H) 10/05/2018   HGBA1C 7.0 (A) 05/18/2018   She is on: - Metformin 500 mg in am and 1000 mg with dinner Insulin Before breakfast Before lunch Before dinner Bedtime  Novolog 22-25 >> 18-20 10-15 15-20 -  NPH 20 15 - 35   She tried Ozempic 0.5 mg weekly >> significant diarrhea, abd. Pain, food intolerance >> started 04/2019 -stopped 07/2019  She is checking more than 4 times a day with her CGM.  She has to use skin tac to be able to tolerate the adhesive:  Previously:   Previously:  Lowest sugar was  54 >> 50s >> 53; she has hypoglycemia awareness in the 70s.  Highest sugar was  243 >> 300 >> 300s.  Glucometer: [facility]  Pt's meals are: - Breakfast: oatmeal - instant, peacans, butter >> sausage, bacon, apple sauce, velveeta crackers - Lunch: tortilla + PB and J or [place] and cheese >> yoghurt, apple sauce, half a sandwich - Dinner: meat (hamburger, chicken, bratwurst), spaghetti, green beans, mixed veggies - Snacks:   -No CKD, last BUN/creatinine:  Lab Results  Component Value Date   BUN 46 (H) 12/31/2021   BUN 58 (H) 12/17/2021   CREATININE 0.99 12/31/2021   CREATININE 1.15 (H) 12/17/2021   -+ HL; last set  of lipids: Lab Results  Component Value Date   CHOL 109 02/02/2021   HDL 37.80 (L) 02/02/2021   LDLCALC 41 02/02/2021   LDLDIRECT 78.0 10/05/2018   TRIG 150.0 (H) 02/02/2021   CHOLHDL 3 02/02/2021  On pravastatin 20.  - last eye exam was on 2023: No DR  (previously:+ DR).  She had cataract surgery 02/23/2021.  -+ numbness and tingling in her feet -chemotherapy related.  Last foot exam 11/29/2021.  Pt has FH of DM in daughter, brother, father, P aunts.  She also has a history of endometrial cancer - 2019, also, OSA, chemotherapy-induced peripheral neuropathy, hypothyroidism-on levothyroxine 125 mcg daily.  Latest TSH was normal: Lab Results  Component Value Date   TSH 0.76 02/02/2021   She lost a child at 72 y/o in 60 >> she gained 100 lbs and was dx'ed with DM afterwards.  No h/o pancreatitis or FH of MTC.  Brother died of Lewy body dementia.  ROS: + see HPI  I reviewed pt's medications, allergies, PMH, social hx, family hx, and changes were documented in the history of present illness. Otherwise, unchanged from my initial visit note.  Past Medical History:  Diagnosis Date   Allergic rhinitis    Allergy testing: results pending as of 05/28/16 (Dr. Irena Peters)   Anemia 10/2020   anemia of chronic dz (? + mild IDA?--iron  started   Arthritis    Chemotherapy-induced neuropathy (HCC) 2019   Cough variant asthma    DDD (degenerative disc disease), lumbar    Diabetes mellitus with complication (HCC)    Mild nonprolif DR R eye 12/2017-->referred to retinal specialist DM MANAGED BY DR. Yvonnia Peters AS OF 2021   Endometrial cancer (HCC) 02/2018   REMISSION AS OF 08/2018.  Stage III (T3, Nx, Mx)  Path: endometroid adenocarcinoma involving cervix, uterus, and fallopian tubes (Pt got TAH & BSO 02/2018). No sign of recurrence as of onc f/u 05/2019.   GERD (gastroesophageal reflux disease)    Hemorrhoids    Herpes zoster 10/2015   L side belt-line   History of adenomatous polyp of colon  02/25/2013   5 mm cecal polyp removed by Dr. Jacquelynn Peters 5 yrs   History of blood transfusion    History of cellulitis 08/2010   Left (Since replacemnt of Left knee   History of radiation therapy 05/21/2018   [facility] brachytherapy 04/24/2018-05/21/2018  Dr [name]   Hyperkalemia 03/2018   started after pt started getting chemo-->had to stop enalapril.   Hyperlipidemia    Not on statin b/c lipids "stayed down when sugars came down" per pt.  She says Dr. Talmage Peters knows she is not on statin anymore.   Hypertension    Hypothyroidism    Nephrolithiasis 07/2005   Obesity    OSA on CPAP    Osteoarthritis    knees, ankle, + ? scapholunate ligament disruption (x-ray 06/2017)--ortho referral.   Pancytopenia due to antineoplastic chemotherapy (HCC)    progressive as of 06/2018. Stable 08/2018.   Recurrent UTI    started cipro 250 qd 09/2018   Sciatica of left side    Severe persistent asthma    cough-variant--saw Allergist 05/25/16 and was switched from max dose advair to symbicort.   Systolic murmur    ECHO fine 10/2016-->murmur likely flow murmur assoc with HTN.   Zoon's vulvitis    Bx-proven (GYN) -lichen sclerosis.  Clobetasol 0.05% ointment per GYN   Past Surgical History:  Procedure Laterality Date   ANKLE FRACTURE SURGERY  1984   Pin & repair   ARTHROSCOPIC REPAIR ACL  2000   Due to ACL tear   ARTHROSCOPIC REPAIR ACL  5/08   Blateral knee rerlacements x4 2 times each knee     BREAST BIOPSY Right 2014   Benign   CARDIAC CATHETERIZATION  5/07   clear vessel mild mitral    CARPAL TUNNEL RELEASE Right [id number]   1989 Left   CESAREAN SECTION  1985   CHOLECYSTECTOMY OPEN  1988   COLONOSCOPY N/A 02/25/2013   Tubular adenoma x 1: Recall 5 yrs. Procedure: COLONOSCOPY;  Surgeon: Allison Elizabeth, MD;  Location: [facility];  Service: Endoscopy;  Laterality: N/A;   COMBINED HYSTEROSCOPY DIAGNOSTIC / D&C  2/12   Bx neg   DEXA  08/2007; 05/12/20   Bone density normal 2009 and 2022.  Peters rpt  2024.   DILATION AND CURETTAGE OF UTERUS  12/11   IR IMAGING GUIDED PORT INSERTION  03/16/2018   IR REMOVAL TUN ACCESS W/ PORT W/O FL MOD SED  09/20/2018   LYMPH NODE BIOPSY N/A 02/19/2018   Procedure: Sentinel LYMPH NODE BIOPSY;  Surgeon: Adolphus Birchwood, MD;  Location: [facility];  Service: Gynecology;  Laterality: N/A;   REPLACEMENT TOTAL KNEE Left 5/12   X 2 on each   ROBOTIC ASSISTED TOTAL HYSTERECTOMY WITH BILATERAL SALPINGO OOPHERECTOMY N/A 02/19/2018  For endometrial cancer.  Procedure: XI ROBOTIC ASSISTED TOTAL HYSTERECTOMY WITH BILATERAL SALPINGO OOPHORECTOMY;  Surgeon: Adolphus Birchwood, MD;  Location: [facility];  Service: Gynecology;  Laterality: N/A;   TRANSTHORACIC ECHOCARDIOGRAM  10/11/2016    EF 55-60%, grd I DD.   TUBAL LIGATION  1985   C-Section   Social History   Socioeconomic History   Marital status: Married    Spouse name: Harvie Heck   Number of children: 2   Years of education: Not on file   Highest education level: Bachelor's degree (e.g., BA, AB, BS)  Occupational History   Occupation: retired Charity fundraiser  Tobacco Use   Smoking status: Never   Smokeless tobacco: Never  Vaping Use   Vaping Use: Never used  Substance and Sexual Activity   Alcohol use: No    Alcohol/week: 0.0 standard drinks of alcohol   Drug use: No   Sexual activity: Never    Partners: Male    Birth control/protection: I.U.D.  Other Topics Concern   Not on file  Social History Narrative   Not on file   Social Determinants of Health   Financial Resource Strain: Low Risk  (06/07/2022)   Overall Financial Resource Strain (CARDIA)    Difficulty of Paying Living Expenses: Not hard at all  Food Insecurity: No Food Insecurity (06/07/2022)   Hunger Vital Sign    Worried About Running Out of Food in the Last Year: Never true    Ran Out of Food in the Last Year: Never true  Transportation Needs: No Transportation Needs (06/07/2022)   PRAPARE - Scientist, research (physical sciences) (Medical): No    Lack of Transportation (Non-Medical): No  Physical Activity: Patient Declined (06/07/2022)   Exercise Vital Sign    Days of Exercise per Week: Patient declined    Minutes of Exercise per Session: Patient declined  Recent Concern: Physical Activity - Inactive (03/15/2022)   Exercise Vital Sign    Days of Exercise per Week: 0 days    Minutes of Exercise per Session: 0 min  Stress: No Stress Concern Present (06/07/2022)   [employer] of Occupational Health - Occupational Stress Questionnaire    Feeling of Stress : Not at all  Social Connections: Socially Integrated (06/07/2022)   Social Connection and Isolation Panel [NHANES]    Frequency of Communication with Friends and Family: More than three times a week    Frequency of Social Gatherings with Friends and Family: Once a week    Attends Religious Services: 1 to 4 times per year    Active Member of [employer] or Organizations: Yes    Attends Banker Meetings: Patient declined    Marital Status: Married  Catering manager Violence: Not At Risk (06/08/2022)   Humiliation, Afraid, Rape, and Kick questionnaire    Fear of Current or Ex-Partner: No    Emotionally Abused: No    Physically Abused: No    Sexually Abused: No   Current Outpatient Medications on File Prior to Visit  Medication Sig Dispense Refill   albuterol (PROVENTIL) (2.5 MG/3ML) 0.083% nebulizer solution Take 3 mLs (2.5 mg total) by nebulization every 4 (four) hours as needed for wheezing or shortness of breath (Dx: J45.50). 75 mL 1   albuterol (VENTOLIN HFA) 108 (90 Base) MCG/ACT inhaler INHALE 2 PUFFS INTO LUNGS EVERY 6 HOURS AS NEEDED FOR WHEEZING/SHORTNESS OF BREATH 18 each 1   amitriptyline (ELAVIL) 25 MG tablet Take 2 tablets (50 mg total) by mouth at bedtime. 180  tablet 1   Apoaequorin (PREVAGEN PO) Take by mouth daily.     clobetasol ointment (TEMOVATE) 0.05 % Apply 1 Application topically as needed. Apply to the skin of the  vulva for itching 30 g 2   Continuous Blood Gluc Receiver (FREESTYLE LIBRE 14 DAY READER) DEVI by Does not apply route.     diclofenac Sodium (VOLTAREN) 1 % GEL Apply 2 g topically 4 (four) times daily. 100 g 3   felodipine (PLENDIL) 10 MG 24 hr tablet TAKE 1 TABLET BY MOUTH EVERY DAY 90 tablet 1   Ferrous Sulfate (IRON PO) Take by mouth.     fluticasone (CUTIVATE) 0.05 % cream APPLY TO AFFECTED AREA OF RIGHT LOWER LEG TWICE PER DAY. 60 g 1   fluticasone (VERAMYST) 27.5 MCG/SPRAY nasal spray Place 1 spray into the nose daily as needed for rhinitis.     furosemide (LASIX) 20 MG tablet TAKE 2 TABLETS (40 MG TOTAL) BY MOUTH DAILY. 180 tablet 0   HYDROcodone-acetaminophen (NORCO/VICODIN) 5-325 MG tablet 1-2 tabs po bid prn pain 90 tablet 0   insulin aspart (FIASP FLEXTOUCH) 100 UNIT/ML FlexTouch Pen Inject 10-20 Units into the skin 3 (three) times daily before meals. (Patient not taking: Reported on 06/16/2022) 45 mL 3   Insulin Syringes, Disposable, U-100 0.3 ML MISC Use to inject insulin--6 injections per day 540 each 3   levocetirizine (XYZAL) 5 MG tablet Take 5 mg by mouth at bedtime as needed for allergies.   5   levothyroxine (SYNTHROID) 125 MCG tablet TAKE 1 TABLET BY MOUTH EVERY DAY 90 tablet 0   lisinopril (ZESTRIL) 10 MG tablet TAKE 1 TABLET BY MOUTH EVERY DAY 90 tablet 1   metFORMIN (GLUCOPHAGE) 500 MG tablet TAKE 1 TABLET BY MOUTH EVERY MORNING AND 2 TABLETS BY MOUTH AT BEDTIME. Strength: 500 mg 270 tablet 0   miconazole (MICOTIN) 2 % powder Apply 1 application topically 2 (two) times daily as needed for itching.     Mometasone Furo-Formoterol Fum (DULERA) 50-5 MCG/ACT AERO 2 puffs bid (Patient not taking: Reported on 06/16/2022) 3 each 1   mupirocin ointment (BACTROBAN) 2 % Apply 1 Application topically 3 (three) times daily. Apply to affected areas tid x 10d 15 g 0   NOVOLOG 100 UNIT/ML injection INJECT 20-25 UNITS INTO THE SKIN 3 TIMES A DAY BEFORE MEALS 30 mL 5   pravastatin (PRAVACHOL)  40 MG tablet Take 1 tablet (40 mg total) by mouth daily. 90 tablet 1   Probiotic Product (PROBIOTIC PO) Take 1 capsule by mouth daily.     VITAMIN D PO Take by mouth.     No current facility-administered medications on file prior to visit.   Allergies  Allergen Reactions   Adhesive [Tape] Other (See Comments)    Takes patient's skin off.   Banana Diarrhea and Nausea Only   Egg-Derived Products Diarrhea and Nausea Only   Latex Other (See Comments)    sensativity   Linzess [Linaclotide] Diarrhea   Ozempic (0.25 Or 0.5 Mg-Dose) [Semaglutide(0.25 Or 0.5mg -Dos)] Diarrhea   Penicillins Rash and Other (See Comments)    Has had cephalosporins without incident   Pine Swelling and Rash   Rose Swelling and Rash   Family History  Problem Relation Age of Onset   Diabetes Father    COPD Father    Stroke Father    Celiac disease Brother    Congenital heart disease Brother    Rheum arthritis Brother    Thyroid disease Brother  Diabetes Brother    Dementia Brother        Lewy Body   Alzheimer's disease Mother    Arthritis Mother    Other Son        Died age 23 -Tetrology of Fallot - VSD/pulmonary atresia   Breast cancer Other        postmenopausal when diagnosed   PE: BP (!) 140/68 (BP Location: Right Arm, Patient Position: Sitting, Cuff Size: Normal)   Pulse 100   Ht 5\' 1"  (1.549 m)   Wt 261 lb (118.4 kg)   LMP 08/10/2010 Comment: Hysterectomy 02/02/18  SpO2 93%   BMI 49.32 kg/m  Wt Readings from Last 3 Encounters:  07/18/22 261 lb (118.4 kg)  06/29/22 262 lb 6.4 oz (119 kg)  06/16/22 259 lb 9.6 oz (117.8 kg)   Constitutional: obese, in NAD, in motorized wheelchair Eyes: EOMI, no exophthalmos ENT: no thyromegaly, no cervical lymphadenopathy Cardiovascular: RRR, No MRG, + bilateral LE pitting edema Respiratory: CTA B Musculoskeletal: no deformities Skin: bilateral periankle/shin erythema Neurological: no tremor with outstretched hands  ASSESSMENT: 1. DM2,  insulin-dependent, uncontrolled, with complications - mild NP DR OD  2. HL  3.  Obesity class III  Peters:  1. Patient with longstanding, uncontrolled, type 2 diabetes, on metformin and basal/bolus insulin regimen with NPH and NovoLog insulin, with improved control last few visits.  At last visit, HbA1c was lower, at 6.1%. -At that time, reviewing the CGM trends, sugars appears to be controlled throughout the day with occasional hyperglycemic spikes and instances and lower blood sugars, even down to 54, especially in the middle of the day.  I advised her to reduce NovoLog doses before breakfast.  She was increasing NPH in the morning based on whether she had more carbs with breakfast or not and I advised her to change the NovoLog dose instead in the situation. CGM interpretation: -At today's visit, we reviewed her CGM downloads: It appears that 80% of values are in target range (goal >70%), while 19% are higher than 180 (goal <25%), and 1% are lower than 70 (goal <4%).  The calculated average blood sugar is 150.  The projected HbA1c for the next 3 months (GMI) is 6.9%. -Reviewing the CGM trends, sugars appear to be mostly fluctuating within the target range, but with higher blood sugars after approximately 11 PM.  We discussed about paying attention to her snacks at night, ideally eliminating them, but otherwise I did not suggest a change in regimen. -She prefers NovoLog in vial form so I sent this to her pharmacy - I suggested to:  Patient Instructions  Please continue: - Metformin 500 mg in am and 1000 mg with dinner  Please use the following regimen: Insulin Before breakfast Before lunch Before dinner Bedtime  Novolog 18-20 10-15 15-20 -  NPH 20 15 - 35   Please come back for a follow-up appointment in 4-6 months.  - we checked her HbA1c: 6.6% (higher) - advised to check sugars at different times of the day - 4x a day, rotating check times - advised for yearly eye exams >> she is UTD -  return to clinic in 4-6 months  2. HL -Reviewed latest lipid panel from 01/2021: Fractions at goal for a slightly low HDL Lab Results  Component Value Date   CHOL 109 02/02/2021   HDL 37.80 (L) 02/02/2021   LDLCALC 41 02/02/2021   LDLDIRECT 78.0 10/05/2018   TRIG 150.0 (H) 02/02/2021   CHOLHDL 3 02/02/2021  -She  continues on pravastatin 20 mg daily without side effects -She is due for another lipid panel -she has an appointment coming up with PCP  3.  Obesity class III -Unfortunately, we could not continue with Ozempic which would have help with weight loss, as this was not covered by insurance -At last visit, she gained 6 pounds, previously lost 12 -She gained 5 pounds since last visit  Carlus Pavlovristina Matteson Blue, MD PhD [facility]

## 2022-08-08 ENCOUNTER — Other Ambulatory Visit: Payer: Self-pay | Admitting: Family Medicine

## 2022-08-09 ENCOUNTER — Other Ambulatory Visit: Payer: Self-pay | Admitting: Family Medicine

## 2022-08-10 MED ORDER — HYDROCODONE-ACETAMINOPHEN 5-325 MG PO TABS: ORAL_TABLET | ORAL | 0 refills | Status: DC

## 2022-08-10 NOTE — Telephone Encounter (Signed)
Requesting: [employer] Contract: 02/02/21 UDS: 02/02/21 Last Visit: 06/29/22 Next Visit: 3 mo RCI Last Refill: 06/16/22 (90,0)  Please Advise. Med pending

## 2022-08-24 ENCOUNTER — Other Ambulatory Visit: Payer: Self-pay | Admitting: Family Medicine

## 2022-08-26 ENCOUNTER — Other Ambulatory Visit: Payer: Self-pay | Admitting: Family Medicine

## 2022-09-21 ENCOUNTER — Other Ambulatory Visit: Payer: Self-pay | Admitting: Family Medicine

## 2022-09-21 NOTE — Telephone Encounter (Signed)
Requesting: [employer] Contract: 02/02/21 UDS: 02/02/21 Last Visit: 06/29/22 Next Visit: 09/26/22 Last Refill: 08/10/22 (90,0)  Please Advise. Med pending

## 2022-09-22 MED ORDER — HYDROCODONE-ACETAMINOPHEN 5-325 MG PO TABS: ORAL_TABLET | ORAL | 0 refills | Status: DC

## 2022-09-22 NOTE — Telephone Encounter (Signed)
Pt advised refill sent. °

## 2022-09-26 ENCOUNTER — Ambulatory Visit (HOSPITAL_BASED_OUTPATIENT_CLINIC_OR_DEPARTMENT_OTHER)
Admission: RE | Admit: 2022-09-26 | Discharge: 2022-09-26 | Disposition: A | Discharge status: Home / Self Care | Payer: Medicare Other | Source: Ambulatory Visit | Attending: Family Medicine | Admitting: Family Medicine

## 2022-09-26 ENCOUNTER — Encounter: Payer: Self-pay | Admitting: Family Medicine

## 2022-09-26 ENCOUNTER — Ambulatory Visit (INDEPENDENT_AMBULATORY_CARE_PROVIDER_SITE_OTHER): Payer: Medicare Other | Admitting: Family Medicine

## 2022-09-26 VITALS — BP 140/60 | HR 106 | Wt 257.8 lb

## 2022-09-26 DIAGNOSIS — M25531 Pain in right wrist: Secondary | ICD-10-CM | POA: Insufficient documentation

## 2022-09-26 DIAGNOSIS — I10 Essential (primary) hypertension: Secondary | ICD-10-CM | POA: Diagnosis not present

## 2022-09-26 DIAGNOSIS — M159 Polyosteoarthritis, unspecified: Secondary | ICD-10-CM

## 2022-09-26 DIAGNOSIS — M79644 Pain in right finger(s): Secondary | ICD-10-CM | POA: Diagnosis not present

## 2022-09-26 DIAGNOSIS — M150 Primary generalized (osteo)arthritis: Secondary | ICD-10-CM

## 2022-09-26 DIAGNOSIS — G894 Chronic pain syndrome: Secondary | ICD-10-CM | POA: Diagnosis not present

## 2022-09-26 DIAGNOSIS — M792 Neuralgia and neuritis, unspecified: Secondary | ICD-10-CM

## 2022-09-26 MED ORDER — FELODIPINE ER 10 MG PO TB24: 10.0000 mg | ORAL_TABLET | Freq: Every day | ORAL | 1 refills | Status: DC

## 2022-09-26 MED ORDER — LISINOPRIL 10 MG PO TABS: 10.0000 mg | ORAL_TABLET | Freq: Every day | ORAL | 1 refills | Status: DC

## 2022-09-26 NOTE — Progress Notes (Signed)
OFFICE VISIT  12/01/2022  CC:  Chief Complaint  Patient presents with   Hand Pain    Right hand, past 3 weeks have gotten worse. Some swelling.     Patient is a 72 y.o. female who presents for 70-month follow-up chronic pain syndrome, HTN, as well as pain in right hand.  HPI: Pain in right wrist/hand worsening.  Base of thumb focally, extending a few centimeters proximally and distally.  No redness or swelling.  Worse with essentially any movement.  No home blood pressure monitoring.  Indication for chronic opioid: osteoarthritis in hands, low back, feet, L hip, knees, wrists+chemotherapy -induced neuropathy (burning and numbness in feet). Takes 1-2 hydrocodone tabs a day typically.  Not NSAID candidate due to renal insufficiency.  PMP AWARE reviewed today: most recent rx for Vicodin 5-325 was filled 09/22/2022, # 90, rx by me. No red flags.  ROS as above, plus--> no fevers, no CP, no SOB, no wheezing, no cough, no dizziness, no HAs, no rashes, no melena/hematochezia.  No polyuria or polydipsia.    No focal weakness, paresthesias, or tremors.  No acute vision or hearing abnormalities.  No dysuria or unusual/new urinary urgency or frequency.  No recent changes in lower legs. No n/v/d or abd pain.  No palpitations.    Past Medical History:  Diagnosis Date   Allergic rhinitis    Allergy testing: results pending as of 05/28/16 (Dr. Irena Cords)   Anemia 10/2020   anemia of chronic dz (? + mild IDA?--iron started   Arthritis    Chemotherapy-induced neuropathy (HCC) 2019   Cough variant asthma    DDD (degenerative disc disease), lumbar    Diabetes mellitus with complication (HCC)    Mild nonprolif DR R eye 12/2017-->referred to retinal specialist DM MANAGED BY DR. GHERGHE AS OF 2021   Endometrial cancer (HCC) 02/2018   REMISSION AS OF 08/2018.  Stage III (T3, Nx, Mx)  Path: endometroid adenocarcinoma involving cervix, uterus, and fallopian tubes (Pt got TAH & BSO 02/2018). No sign of  recurrence as of onc f/u 05/2019.   GERD (gastroesophageal reflux disease)    Hemorrhoids    Herpes zoster 10/2015   L side belt-line   History of adenomatous polyp of colon 02/25/2013   5 mm cecal polyp removed by Dr. Jacquelynn Cree 5 yrs   History of blood transfusion    History of cellulitis 08/2010   Left (Since replacemnt of Left knee   History of radiation therapy 05/21/2018   HDR [facility] brachytherapy 04/24/2018-05/21/2018  Dr Antony Blackbird   Hyperkalemia 03/2018   started after pt started getting chemo-->had to stop enalapril.   Hyperlipidemia    Not on statin b/c lipids "stayed down when sugars came down" per pt.  She says Dr. Talmage Nap knows she is not on statin anymore.   Hypertension    Hypothyroidism    Nephrolithiasis 07/2005   Obesity    OSA on CPAP    Osteoarthritis    knees, ankle, + ? scapholunate ligament disruption (x-ray 06/2017).  Progressive arthritis L wrist and hand 09/2022 radiographs   Pancytopenia due to antineoplastic chemotherapy [facility])    progressive as of 06/2018. Stable 08/2018.   Recurrent UTI    started cipro 250 qd 09/2018   Sciatica of left side    Severe persistent asthma    cough-variant--saw Allergist 05/25/16 and was switched from max dose advair to symbicort.   Systolic murmur    ECHO fine 10/2016-->murmur likely flow murmur assoc with HTN.  Zoon's vulvitis    Bx-proven (GYN) -lichen sclerosis.  Clobetasol 0.05% ointment per GYN    Past Surgical History:  Procedure Laterality Date   ANKLE FRACTURE SURGERY  1984   Pin & repair   ARTHROSCOPIC REPAIR ACL  2000   Due to ACL tear   ARTHROSCOPIC REPAIR ACL  5/08   Blateral knee rerlacements x4 2 times each knee     BREAST BIOPSY Right 2014   Benign   CARDIAC CATHETERIZATION  5/07   clear vessel mild mitral    CARPAL TUNNEL RELEASE Right [id number]   1989 Left   CESAREAN SECTION  1985   CHOLECYSTECTOMY OPEN  1988   COLONOSCOPY N/A 02/25/2013   Tubular adenoma x 1: Recall 5 yrs. Procedure: COLONOSCOPY;   Surgeon: Charna Elizabeth, MD;  Location: [facility];  Service: Endoscopy;  Laterality: N/A;   COMBINED HYSTEROSCOPY DIAGNOSTIC / D&C  2/12   Bx neg   DEXA  08/2007; 05/12/20   Bone density normal 2009 and 2022.  Plan rpt 2024.   DILATION AND CURETTAGE OF UTERUS  12/11   IR IMAGING GUIDED PORT INSERTION  03/16/2018   IR REMOVAL TUN ACCESS W/ PORT W/O FL MOD SED  09/20/2018   LYMPH NODE BIOPSY N/A 02/19/2018   Procedure: Sentinel LYMPH NODE BIOPSY;  Surgeon: Adolphus Birchwood, MD;  Location: [facility];  Service: Gynecology;  Laterality: N/A;   REPLACEMENT TOTAL KNEE Left 5/12   X 2 on each   ROBOTIC ASSISTED TOTAL HYSTERECTOMY WITH BILATERAL SALPINGO OOPHERECTOMY N/A 02/19/2018   For endometrial cancer.  Procedure: XI ROBOTIC ASSISTED TOTAL HYSTERECTOMY WITH BILATERAL SALPINGO OOPHORECTOMY;  Surgeon: Adolphus Birchwood, MD;  Location: [facility];  Service: Gynecology;  Laterality: N/A;   TRANSTHORACIC ECHOCARDIOGRAM  10/11/2016    EF 55-60%, grd I DD.   TUBAL LIGATION  1985   C-Section    Outpatient Medications Prior to Visit  Medication Sig Dispense Refill   albuterol (PROVENTIL) (2.5 MG/3ML) 0.083% nebulizer solution Take 3 mLs (2.5 mg total) by nebulization every 4 (four) hours as needed for wheezing or shortness of breath (Dx: J45.50). 75 mL 1   amitriptyline (ELAVIL) 25 MG tablet Take 2 tablets (50 mg total) by mouth at bedtime. 180 tablet 1   Apoaequorin (PREVAGEN PO) Take by mouth daily.     clobetasol ointment (TEMOVATE) 0.05 % Apply 1 Application topically as needed. Apply to the skin of the vulva for itching 30 g 2   diclofenac Sodium (VOLTAREN) 1 % GEL Apply 2 g topically 4 (four) times daily. 100 g 3   Ferrous Sulfate (IRON PO) Take by mouth.     fluticasone (CUTIVATE) 0.05 % cream APPLY TO AFFECTED AREA OF RIGHT LOWER LEG TWICE PER DAY. 60 g 1   fluticasone (VERAMYST) 27.5 MCG/SPRAY nasal spray Place 1 spray into the nose daily as needed for rhinitis.      insulin aspart (NOVOLOG) 100 UNIT/ML injection INJECT 20-25 UNITS INTO THE SKIN 3 TIMES A DAY BEFORE MEALS 30 mL 5   Insulin Syringes, Disposable, U-100 0.3 ML MISC Use to inject insulin--6 injections per day 540 each 3   levocetirizine (XYZAL) 5 MG tablet Take 5 mg by mouth at bedtime as needed for allergies.   5   metFORMIN (GLUCOPHAGE) 500 MG tablet TAKE 1 TABLET BY MOUTH EVERY MORNING AND 2 TABLETS BY MOUTH AT BEDTIME. Strength: 500 mg 270 tablet 3   miconazole (MICOTIN) 2 % powder Apply 1 application topically 2 (two)  times daily as needed for itching.     mupirocin ointment (BACTROBAN) 2 % Apply 1 Application topically 3 (three) times daily. Apply to affected areas tid x 10d 15 g 0   pravastatin (PRAVACHOL) 40 MG tablet Take 1 tablet (40 mg total) by mouth daily. 90 tablet 1   Probiotic Product (PROBIOTIC PO) Take 1 capsule by mouth daily.     VITAMIN D PO Take by mouth.     albuterol (VENTOLIN HFA) 108 (90 Base) MCG/ACT inhaler INHALE 2 PUFFS INTO LUNGS EVERY 6 HOURS AS NEEDED FOR WHEEZING/SHORTNESS OF BREATH 18 each 0   felodipine (PLENDIL) 10 MG 24 hr tablet Take 1 tablet (10 mg total) by mouth daily. OFFICE VISIT NEEDED FOR FURTHER REFILLS 30 tablet 0   furosemide (LASIX) 20 MG tablet TAKE 2 TABLETS (40 MG TOTAL) BY MOUTH DAILY. 180 tablet 0   HYDROcodone-acetaminophen (NORCO/VICODIN) 5-325 MG tablet 1-2 tabs po bid prn pain 90 tablet 0   levothyroxine (SYNTHROID) 125 MCG tablet Take 1 tablet (125 mcg total) by mouth daily. OFFICE VISIT NEEDED FOR FURTHER REFILLS 30 tablet 0   lisinopril (ZESTRIL) 10 MG tablet Take 1 tablet (10 mg total) by mouth daily. OFFICE VISIT NEEDED FOR FURTHER REFILLS 30 tablet 0   Mometasone Furo-Formoterol Fum (DULERA) 50-5 MCG/ACT AERO 2 puffs bid (Patient not taking: Reported on 06/16/2022) 3 each 1   No facility-administered medications prior to visit.    Allergies  Allergen Reactions   Adhesive [Tape] Other (See Comments)    Takes patient's skin off.    Banana Diarrhea and Nausea Only   Egg-Derived Products Diarrhea and Nausea Only   Latex Other (See Comments)    sensativity   Linzess [Linaclotide] Diarrhea   Ozempic (0.25 Or 0.5 Mg-Dose) [Semaglutide(0.25 Or 0.5mg -Dos)] Diarrhea   Penicillins Rash and Other (See Comments)    Has had cephalosporins without incident   Pine Swelling and Rash   Rose Swelling and Rash    Review of Systems  As per HPI  PE:    09/26/2022   10:57 AM 09/26/2022   10:53 AM 07/18/2022    3:12 PM  Vitals with BMI  Height   5\' 1"   Weight  257 lbs 13 oz 261 lbs  BMI   49.34  Systolic 140 167 409  Diastolic 60 72 68  Pulse  106 811     Physical Exam  General: Alert and well-appearing. Moderate to severe tenderness to palpation along base of thumb, extending several centimeters proximally.  Finkelstein's positive.  Discomfort in the same area with wrist flexion and extension.  LABS:  Last metabolic panel Lab Results  Component Value Date   GLUCOSE 95 12/31/2021   NA 140 12/31/2021   K 5.0 12/31/2021   CL 103 12/31/2021   CO2 28 12/31/2021   BUN 46 (H) 12/31/2021   CREATININE 0.99 12/31/2021   GFRNONAA >60 06/16/2020   CALCIUM 9.4 12/31/2021   PROT 7.1 09/02/2021   ALBUMIN 3.8 09/02/2021   BILITOT 0.2 09/02/2021   ALKPHOS 58 09/02/2021   AST 18 09/02/2021   ALT 16 09/02/2021   ANIONGAP 10 06/16/2020   Last hemoglobin A1c Lab Results  Component Value Date   HGBA1C 6.6 (A) 07/18/2022   IMPRESSION AND PLAN:  #1 hypertension, will continue felt loaded pain 10 mg a day and lisinopril 10 mg a day.  #2 chronic pain syndrome. All stable except worsened right wrist/thumb. Continue Vicodin 5/325, 1-2 twice daily as needed.  New prescription was  not needed today.  #3 acute-on-chronic right wrist/base of thumb pain.  Hard to tell whether the focus of this is arthritic vs tenosynovitis. She wears a wrist splint adequately. Discussed option of steroid injection in first dorsal wrist compartment  sheath versus CMC joint. Decided on de Quervain's injection.  Also will obtain right thumb and right wrist radiographs.  Ultrasound-guided injection is preferred based on studies that show increased duration, increased effect, greater accuracy, decreased procedural pain, increased response rate, and decreased cost with ultrasound-guided versus blind injection. Procedure: Real-time ultrasound guided injection of first dorsal wrist compartment on the right. Device: [employer] informed consent obtained.  Timeout conducted.  No overlying erythema, induration, or other signs of local infection. After sterile prep with Betadine, injected a mixture of 20 mg Kenalog and 1/2 mL of 1% plain lidocaine.  Injectate seen filling tendon sheath. Patient tolerated the procedure well.  No immediate complications.  Post-injection care discussed. Advised to call if fever/chills, erythema, drainage, or persistent bleeding.  Impression: Technically successful ultrasound-guided injection.  An After Visit Summary was printed and given to the patient.  FOLLOW UP: Return in about 3 months (around 12/27/2022) for routine chronic illness f/u.  Signed:  Santiago Bumpers, MD           12/01/2022

## 2022-09-29 ENCOUNTER — Encounter: Payer: Self-pay | Admitting: Family Medicine

## 2022-09-30 ENCOUNTER — Other Ambulatory Visit: Payer: Self-pay | Admitting: Family Medicine

## 2022-09-30 ENCOUNTER — Encounter: Payer: Self-pay | Admitting: Family Medicine

## 2022-09-30 NOTE — Telephone Encounter (Signed)
Wrist and hand x-ray result came in today. No fracture. Severe and arthritis is present in several regions, particularly the thumb. Please ask her if she got any relief from the injection I did the other day.

## 2022-09-30 NOTE — Telephone Encounter (Signed)
Results are now available. Please advise.  

## 2022-11-02 ENCOUNTER — Other Ambulatory Visit: Payer: Self-pay | Admitting: Family Medicine

## 2022-11-02 MED ORDER — ALBUTEROL SULFATE HFA 108 (90 BASE) MCG/ACT IN AERS: INHALATION_SPRAY | RESPIRATORY_TRACT | 0 refills | Status: DC

## 2022-11-07 ENCOUNTER — Other Ambulatory Visit: Payer: Self-pay | Admitting: Family Medicine

## 2022-11-07 MED ORDER — HYDROCODONE-ACETAMINOPHEN 5-325 MG PO TABS: ORAL_TABLET | ORAL | 0 refills | Status: DC

## 2022-11-07 NOTE — Telephone Encounter (Signed)
Requesting: HYDROcodone-acetaminophen (NORCO/VICODIN) 5-325 MG tablet  Contract: 02/02/21 UDS: 02/02/21 Last Visit: 09/26/22 Next Visit: 12/28/22 Last Refill: 09/22/22 (90,0)  Please Advise. Med pending

## 2022-11-09 ENCOUNTER — Telehealth: Payer: Self-pay

## 2022-11-09 MED ORDER — HYDROCODONE-ACETAMINOPHEN 10-325 MG PO TABS: ORAL_TABLET | ORAL | 0 refills | Status: DC

## 2022-11-09 NOTE — Telephone Encounter (Signed)
Noted  

## 2022-11-09 NOTE — Telephone Encounter (Signed)
Yes, okay.  New prescription sent

## 2022-11-09 NOTE — Telephone Encounter (Signed)
Pharmacy fax received stating Hydrocodone 5-325mg  is on back order.  Please advise if pt can switch to 10-325mg , Acetaminophen-Codeine 120-12mg  or Hydrocodone-Ibuprofen 7.5-200mg ?

## 2022-11-09 NOTE — Addendum Note (Signed)
Addended by: Jeoffrey Massed on: 11/09/2022 10:57 AM   Modules accepted: Orders

## 2022-11-11 ENCOUNTER — Other Ambulatory Visit: Payer: Self-pay | Admitting: Family Medicine

## 2022-11-24 ENCOUNTER — Encounter (INDEPENDENT_AMBULATORY_CARE_PROVIDER_SITE_OTHER): Payer: Self-pay

## 2022-11-25 ENCOUNTER — Other Ambulatory Visit: Payer: Self-pay | Admitting: Family Medicine

## 2022-12-02 ENCOUNTER — Encounter: Payer: Self-pay | Admitting: Family Medicine

## 2022-12-02 ENCOUNTER — Ambulatory Visit: Payer: Medicare Other | Admitting: Family Medicine

## 2022-12-02 VITALS — BP 127/68 | HR 100 | Temp 98.2°F | Ht 61.0 in | Wt 258.2 lb

## 2022-12-02 DIAGNOSIS — N3001 Acute cystitis with hematuria: Secondary | ICD-10-CM

## 2022-12-02 DIAGNOSIS — R300 Dysuria: Secondary | ICD-10-CM | POA: Diagnosis not present

## 2022-12-02 DIAGNOSIS — N390 Urinary tract infection, site not specified: Secondary | ICD-10-CM

## 2022-12-02 LAB — POCT URINALYSIS DIPSTICK
Glucose, UA: NEGATIVE
Ketones, UA: NEGATIVE
Protein, UA: POSITIVE — AB
Spec Grav, UA: 1.015 (ref 1.010–1.025)
Urobilinogen, UA: 1 E.U./dL
pH, UA: 5 (ref 5.0–8.0)

## 2022-12-02 MED ORDER — CIPROFLOXACIN HCL 500 MG PO TABS: 500.0000 mg | ORAL_TABLET | Freq: Two times a day (BID) | ORAL | 0 refills | Status: AC

## 2022-12-02 NOTE — Progress Notes (Signed)
OFFICE VISIT  12/02/2022  CC:  Chief Complaint  Patient presents with   urinary concern    Pt c/o pain, burning, urgency, frequency x 1 day; trying to stay hydrated, has taken AZO, last dose this morning (8a)     Patient is a 72 y.o. female who presents for urinary concerns.  HPI: 24 hours of burning with urination, urinary urgency, urinary frequency.  No flank pain or gross hematuria or abdominal pain.  No nausea or vomiting.  No fever.  She does have a history of recurrent UTI and has been on daily low-dose Cipro in the past but not currently.  Past Medical History:  Diagnosis Date   Allergic rhinitis    Allergy testing: results pending as of 05/28/16 (Dr. Irena Cords)   Anemia 10/2020   anemia of chronic dz (? + mild IDA?--iron started   Arthritis    Chemotherapy-induced neuropathy (HCC) 2019   Cough variant asthma    DDD (degenerative disc disease), lumbar    Diabetes mellitus with complication (HCC)    Mild nonprolif DR R eye 12/2017-->referred to retinal specialist DM MANAGED BY DR. GHERGHE AS OF 2021   Endometrial cancer (HCC) 02/2018   REMISSION AS OF 08/2018.  Stage III (T3, Nx, Mx)  Path: endometroid adenocarcinoma involving cervix, uterus, and fallopian tubes (Pt got TAH & BSO 02/2018). No sign of recurrence as of onc f/u 05/2019.   GERD (gastroesophageal reflux disease)    Hemorrhoids    Herpes zoster 10/2015   L side belt-line   History of adenomatous polyp of colon 02/25/2013   5 mm cecal polyp removed by Dr. Jacquelynn Cree 5 yrs   History of blood transfusion    History of cellulitis 08/2010   Left (Since replacemnt of Left knee   History of radiation therapy 05/21/2018   [facility] brachytherapy 04/24/2018-05/21/2018  Dr Antony Blackbird   Hyperkalemia 03/2018   started after pt started getting chemo-->had to stop enalapril.   Hyperlipidemia    Not on statin b/c lipids "stayed down when sugars came down" per pt.  She says Dr. Talmage Nap knows she is not on statin anymore.    Hypertension    Hypothyroidism    Nephrolithiasis 07/2005   Obesity    OSA on CPAP    Osteoarthritis    knees, ankle, + ? scapholunate ligament disruption (x-ray 06/2017).  Progressive arthritis L wrist and hand 09/2022 radiographs   Pancytopenia due to antineoplastic chemotherapy [facility])    progressive as of 06/2018. Stable 08/2018.   Recurrent UTI    started cipro 250 qd 09/2018   Sciatica of left side    Severe persistent asthma    cough-variant--saw Allergist 05/25/16 and was switched from max dose advair to symbicort.   Systolic murmur    ECHO fine 10/2016-->murmur likely flow murmur assoc with HTN.   Zoon's vulvitis    Bx-proven (GYN) -lichen sclerosis.  Clobetasol 0.05% ointment per GYN    Past Surgical History:  Procedure Laterality Date   ANKLE FRACTURE SURGERY  1984   Pin & repair   ARTHROSCOPIC REPAIR ACL  2000   Due to ACL tear   ARTHROSCOPIC REPAIR ACL  5/08   Blateral knee rerlacements x4 2 times each knee     BREAST BIOPSY Right 2014   Benign   CARDIAC CATHETERIZATION  5/07   clear vessel mild mitral    CARPAL TUNNEL RELEASE Right 1984;1989   1989 Left   CESAREAN SECTION  1985   CHOLECYSTECTOMY OPEN  1988   COLONOSCOPY N/A 02/25/2013   Tubular adenoma x 1: Recall 5 yrs. Procedure: COLONOSCOPY;  Surgeon: Charna Elizabeth, MD;  Location: [facility];  Service: Endoscopy;  Laterality: N/A;   COMBINED HYSTEROSCOPY DIAGNOSTIC / D&C  2/12   Bx neg   DEXA  08/2007; 05/12/20   Bone density normal 2009 and 2022.  Plan rpt 2024.   DILATION AND CURETTAGE OF UTERUS  12/11   IR IMAGING GUIDED PORT INSERTION  03/16/2018   IR REMOVAL TUN ACCESS W/ PORT W/O FL MOD SED  09/20/2018   LYMPH NODE BIOPSY N/A 02/19/2018   Procedure: Sentinel LYMPH NODE BIOPSY;  Surgeon: Adolphus Birchwood, MD;  Location: [facility];  Service: Gynecology;  Laterality: N/A;   REPLACEMENT TOTAL KNEE Left 5/12   X 2 on each   ROBOTIC ASSISTED TOTAL HYSTERECTOMY WITH BILATERAL SALPINGO OOPHERECTOMY N/A  02/19/2018   For endometrial cancer.  Procedure: XI ROBOTIC ASSISTED TOTAL HYSTERECTOMY WITH BILATERAL SALPINGO OOPHORECTOMY;  Surgeon: Adolphus Birchwood, MD;  Location: [facility] ;  Service: Gynecology;  Laterality: N/A;   TRANSTHORACIC ECHOCARDIOGRAM  10/11/2016    EF 55-60%, grd I DD.   TUBAL LIGATION  1985   C-Section    Outpatient Medications Prior to Visit  Medication Sig Dispense Refill   albuterol (PROVENTIL) (2.5 MG/3ML) 0.083% nebulizer solution Take 3 mLs (2.5 mg total) by nebulization every 4 (four) hours as needed for wheezing or shortness of breath (Dx: J45.50). 75 mL 1   albuterol (VENTOLIN HFA) 108 (90 Base) MCG/ACT inhaler INHALE 2 PUFFS INTO LUNGS EVERY 6 HOURS AS NEEDED FOR WHEEZING/SHORTNESS OF BREATH 18 each 0   amitriptyline (ELAVIL) 25 MG tablet Take 2 tablets (50 mg total) by mouth at bedtime. 180 tablet 1   Apoaequorin (PREVAGEN PO) Take by mouth daily.     clobetasol ointment (TEMOVATE) 0.05 % Apply 1 Application topically as needed. Apply to the skin of the vulva for itching 30 g 2   diclofenac Sodium (VOLTAREN) 1 % GEL Apply 2 g topically 4 (four) times daily. 100 g 3   felodipine (PLENDIL) 10 MG 24 hr tablet Take 1 tablet (10 mg total) by mouth daily. OFFICE VISIT NEEDED FOR FURTHER REFILLS 90 tablet 1   Ferrous Sulfate (IRON PO) Take by mouth.     fluticasone (CUTIVATE) 0.05 % cream APPLY TO AFFECTED AREA OF RIGHT LOWER LEG TWICE PER DAY. 60 g 1   fluticasone (VERAMYST) 27.5 MCG/SPRAY nasal spray Place 1 spray into the nose daily as needed for rhinitis.     furosemide (LASIX) 20 MG tablet Take 1 tablet (20 mg total) by mouth daily. MUST KEEP APPT FOR FURTHER REFILLS 30 tablet 0   HYDROcodone-acetaminophen (NORCO) 10-325 MG tablet 1/2 - 1 tab po bid prn pain 45 tablet 0   insulin aspart (NOVOLOG) 100 UNIT/ML injection INJECT 20-25 UNITS INTO THE SKIN 3 TIMES A DAY BEFORE MEALS 30 mL 5   Insulin Syringes, Disposable, U-100 0.3 ML MISC Use to inject  insulin--6 injections per day 540 each 3   levocetirizine (XYZAL) 5 MG tablet Take 5 mg by mouth at bedtime as needed for allergies.   5   levothyroxine (SYNTHROID) 125 MCG tablet Take 1 tablet (125 mcg total) by mouth daily. 30 tablet 2   lisinopril (ZESTRIL) 10 MG tablet Take 1 tablet (10 mg total) by mouth daily. OFFICE VISIT NEEDED FOR FURTHER REFILLS 90 tablet 1   metFORMIN (GLUCOPHAGE) 500 MG tablet TAKE 1 TABLET BY MOUTH  EVERY MORNING AND 2 TABLETS BY MOUTH AT BEDTIME. Strength: 500 mg 270 tablet 3   miconazole (MICOTIN) 2 % powder Apply 1 application topically 2 (two) times daily as needed for itching.     Mometasone Furo-Formoterol Fum (DULERA) 50-5 MCG/ACT AERO 2 puffs bid 3 each 1   mupirocin ointment (BACTROBAN) 2 % Apply 1 Application topically 3 (three) times daily. Apply to affected areas tid x 10d 15 g 0   pravastatin (PRAVACHOL) 40 MG tablet Take 1 tablet (40 mg total) by mouth daily. 90 tablet 1   Probiotic Product (PROBIOTIC PO) Take 1 capsule by mouth daily.     VITAMIN D PO Take by mouth.     No facility-administered medications prior to visit.    Allergies  Allergen Reactions   Adhesive [Tape] Other (See Comments)    Takes patient's skin off.   Banana Diarrhea and Nausea Only   Egg-Derived Products Diarrhea and Nausea Only   Latex Other (See Comments)    sensativity   Linzess [Linaclotide] Diarrhea   Ozempic (0.25 Or 0.5 Mg-Dose) [Semaglutide(0.25 Or 0.5mg -Dos)] Diarrhea   Penicillins Rash and Other (See Comments)    Has had cephalosporins without incident   Pine Swelling and Rash   Rose Swelling and Rash    Review of Systems  As per HPI  PE:    12/02/2022   10:18 AM 09/26/2022   10:57 AM 09/26/2022   10:53 AM  Vitals with BMI  Height 5\' 1"     Weight 258 lbs 3 oz  257 lbs 13 oz  BMI 48.81    Systolic 127 140 161  Diastolic 68 60 72  Pulse 100  096     Physical Exam  Gen: Alert, well appearing.  Patient is oriented to person, place, time, and  situation. No further exam today  LABS:  Last metabolic panel Lab Results  Component Value Date   GLUCOSE 95 12/31/2021   NA 140 12/31/2021   K 5.0 12/31/2021   CL 103 12/31/2021   CO2 28 12/31/2021   BUN 46 (H) 12/31/2021   CREATININE 0.99 12/31/2021   GFR 57.57 (L) 12/31/2021   CALCIUM 9.4 12/31/2021   PROT 7.1 09/02/2021   ALBUMIN 3.8 09/02/2021   BILITOT 0.2 09/02/2021   ALKPHOS 58 09/02/2021   AST 18 09/02/2021   ALT 16 09/02/2021   ANIONGAP 10 06/16/2020   IMPRESSION AND PLAN:  Urinary tract infection. History of recurrent UTIs. UA today showed trace blood, positive nitrite, 3+ leukocytes. Send specimen for culture and susceptibilities. start Cipro 500 twice daily x 5 days.  An After Visit Summary was printed and given to the patient.  FOLLOW UP: Return if symptoms worsen or fail to improve.  Signed:  Santiago Bumpers, MD           12/02/2022

## 2022-12-03 LAB — URINE CULTURE
MICRO NUMBER:: 15374115
SPECIMEN QUALITY:: ADEQUATE

## 2022-12-06 ENCOUNTER — Encounter: Payer: Self-pay | Admitting: Family Medicine

## 2022-12-06 ENCOUNTER — Other Ambulatory Visit: Payer: Self-pay | Admitting: Family Medicine

## 2022-12-08 ENCOUNTER — Other Ambulatory Visit: Payer: Self-pay

## 2022-12-08 ENCOUNTER — Other Ambulatory Visit: Payer: Self-pay | Admitting: Family Medicine

## 2022-12-08 ENCOUNTER — Encounter: Payer: Self-pay | Admitting: Family Medicine

## 2022-12-08 MED ORDER — ALBUTEROL SULFATE HFA 108 (90 BASE) MCG/ACT IN AERS: INHALATION_SPRAY | RESPIRATORY_TRACT | 1 refills | Status: DC

## 2022-12-13 MED ORDER — TRIAMCINOLONE ACETONIDE 40 MG/ML IJ SUSP
20.0000 mg | Freq: Once | INTRAMUSCULAR | Status: AC
Start: 2022-12-13 — End: 2022-09-26
  Administered 2022-09-26: 20 mg via INTRA_ARTICULAR

## 2022-12-13 NOTE — Addendum Note (Signed)
Addended by: Filomena Jungling on: 12/13/2022 10:28 AM   Modules accepted: Orders

## 2022-12-19 ENCOUNTER — Ambulatory Visit: Payer: Medicare Other | Admitting: Internal Medicine

## 2022-12-20 ENCOUNTER — Ambulatory Visit (INDEPENDENT_AMBULATORY_CARE_PROVIDER_SITE_OTHER): Payer: Medicare Other | Admitting: Family Medicine

## 2022-12-20 ENCOUNTER — Encounter: Payer: Self-pay | Admitting: Family Medicine

## 2022-12-20 VITALS — BP 142/59 | HR 101 | Temp 98.5°F | Ht 61.0 in | Wt 254.4 lb

## 2022-12-20 DIAGNOSIS — L03115 Cellulitis of right lower limb: Secondary | ICD-10-CM | POA: Diagnosis not present

## 2022-12-20 MED ORDER — CLINDAMYCIN HCL 300 MG PO CAPS: 300.0000 mg | ORAL_CAPSULE | Freq: Three times a day (TID) | ORAL | 0 refills | Status: DC

## 2022-12-20 NOTE — Progress Notes (Signed)
OFFICE VISIT  12/20/2022  CC:  Chief Complaint  Patient presents with   Leg Concern    Pt thinks she may have poss cellulitis in R leg due to swelling, red and hot to touch, prior hx of cellulitis, starts @ knee radiating to foot    Patient is a 72 y.o. female who presents for right leg infection concern.  HPI: About 8 days ago she started to notice redness of the right lower leg that has gradually progressed.  It feels warm as well.  Some diffuse pain in lower leg as well.  She has been applying Cutivate cream without improvement. No fever or malaise. She says when her husband was in the hospital recently she forgot to take her Lasix for several days in a row.  When this happens her right leg does always swell up.  The left one does not swell. She got back on her Lasix and the swelling has improved some.   Past Medical History:  Diagnosis Date   Allergic rhinitis    Allergy testing: results pending as of 05/28/16 (Dr. Irena Cords)   Anemia 10/2020   anemia of chronic dz (? + mild IDA?--iron started   Arthritis    Chemotherapy-induced neuropathy (HCC) 2019   Cough variant asthma    DDD (degenerative disc disease), lumbar    Diabetes mellitus with complication (HCC)    Mild nonprolif DR R eye 12/2017-->referred to retinal specialist DM MANAGED BY DR. GHERGHE AS OF 2021   Endometrial cancer (HCC) 02/2018   REMISSION AS OF 08/2018.  Stage III (T3, Nx, Mx)  Path: endometroid adenocarcinoma involving cervix, uterus, and fallopian tubes (Pt got TAH & BSO 02/2018). No sign of recurrence as of onc f/u 05/2019.   GERD (gastroesophageal reflux disease)    Hemorrhoids    Herpes zoster 10/2015   L side belt-line   History of adenomatous polyp of colon 02/25/2013   5 mm cecal polyp removed by Dr. Jacquelynn Cree 5 yrs   History of blood transfusion    History of cellulitis 08/2010   Left (Since replacemnt of Left knee   History of radiation therapy 05/21/2018   HDR [facility] brachytherapy  04/24/2018-05/21/2018  Dr Antony Blackbird   Hyperkalemia 03/2018   started after pt started getting chemo-->had to stop enalapril.   Hyperlipidemia    Not on statin b/c lipids "stayed down when sugars came down" per pt.  She says Dr. Talmage Nap knows she is not on statin anymore.   Hypertension    Hypothyroidism    Nephrolithiasis 07/2005   Obesity    OSA on CPAP    Osteoarthritis    knees, ankle, + ? scapholunate ligament disruption (x-ray 06/2017).  Progressive arthritis L wrist and hand 09/2022 radiographs   Pancytopenia due to antineoplastic chemotherapy [facility])    progressive as of 06/2018. Stable 08/2018.   Recurrent UTI    started cipro 250 qd 09/2018   Sciatica of left side    Severe persistent asthma    cough-variant--saw Allergist 05/25/16 and was switched from max dose advair to symbicort.   Systolic murmur    ECHO fine 10/2016-->murmur likely flow murmur assoc with HTN.   Zoon's vulvitis    Bx-proven (GYN) -lichen sclerosis.  Clobetasol 0.05% ointment per GYN    Past Surgical History:  Procedure Laterality Date   ANKLE FRACTURE SURGERY  1984   Pin & repair   ARTHROSCOPIC REPAIR ACL  2000   Due to ACL tear   ARTHROSCOPIC REPAIR ACL  5/08   Blateral knee rerlacements x4 2 times each knee     BREAST BIOPSY Right 2014   Benign   CARDIAC CATHETERIZATION  5/07   clear vessel mild mitral    CARPAL TUNNEL RELEASE Right [id number]   1989 Left   CESAREAN SECTION  1985   CHOLECYSTECTOMY OPEN  1988   COLONOSCOPY N/A 02/25/2013   Tubular adenoma x 1: Recall 5 yrs. Procedure: COLONOSCOPY;  Surgeon: Charna Elizabeth, MD;  Location: [facility];  Service: Endoscopy;  Laterality: N/A;   COMBINED HYSTEROSCOPY DIAGNOSTIC / D&C  2/12   Bx neg   DEXA  08/2007; 05/12/20   Bone density normal 2009 and 2022.  Plan rpt 2024.   DILATION AND CURETTAGE OF UTERUS  12/11   IR IMAGING GUIDED PORT INSERTION  03/16/2018   IR REMOVAL TUN ACCESS W/ PORT W/O FL MOD SED  09/20/2018   LYMPH NODE BIOPSY N/A 02/19/2018    Procedure: Sentinel LYMPH NODE BIOPSY;  Surgeon: Adolphus Birchwood, MD;  Location: [facility];  Service: Gynecology;  Laterality: N/A;   REPLACEMENT TOTAL KNEE Left 5/12   X 2 on each   ROBOTIC ASSISTED TOTAL HYSTERECTOMY WITH BILATERAL SALPINGO OOPHERECTOMY N/A 02/19/2018   For endometrial cancer.  Procedure: XI ROBOTIC ASSISTED TOTAL HYSTERECTOMY WITH BILATERAL SALPINGO OOPHORECTOMY;  Surgeon: Adolphus Birchwood, MD;  Location: [facility];  Service: Gynecology;  Laterality: N/A;   TRANSTHORACIC ECHOCARDIOGRAM  10/11/2016    EF 55-60%, grd I DD.   TUBAL LIGATION  1985   C-Section    Outpatient Medications Prior to Visit  Medication Sig Dispense Refill   albuterol (PROVENTIL) (2.5 MG/3ML) 0.083% nebulizer solution Take 3 mLs (2.5 mg total) by nebulization every 4 (four) hours as needed for wheezing or shortness of breath (Dx: J45.50). 75 mL 1   albuterol (VENTOLIN HFA) 108 (90 Base) MCG/ACT inhaler INHALE 2 PUFFS INTO LUNGS EVERY 6 HOURS AS NEEDED FOR WHEEZING/SHORTNESS OF BREATH 18 g 1   amitriptyline (ELAVIL) 25 MG tablet Take 2 tablets (50 mg total) by mouth at bedtime. 180 tablet 1   Apoaequorin (PREVAGEN PO) Take by mouth daily.     clobetasol ointment (TEMOVATE) 0.05 % Apply 1 Application topically as needed. Apply to the skin of the vulva for itching 30 g 2   diclofenac Sodium (VOLTAREN) 1 % GEL Apply 2 g topically 4 (four) times daily. 100 g 3   felodipine (PLENDIL) 10 MG 24 hr tablet Take 1 tablet (10 mg total) by mouth daily. OFFICE VISIT NEEDED FOR FURTHER REFILLS 90 tablet 1   Ferrous Sulfate (IRON PO) Take by mouth.     FIASP FLEXTOUCH 100 UNIT/ML FlexTouch Pen SMARTSIG:20-25 Unit(s) SUB-Q 3 Times Daily     fluticasone (CUTIVATE) 0.05 % cream APPLY TO AFFECTED AREA OF RIGHT LOWER LEG TWICE PER DAY. 60 g 1   fluticasone (VERAMYST) 27.5 MCG/SPRAY nasal spray Place 1 spray into the nose daily as needed for rhinitis.     furosemide (LASIX) 20 MG tablet Take 1  tablet (20 mg total) by mouth daily. MUST KEEP APPT FOR FURTHER REFILLS 30 tablet 0   HYDROcodone-acetaminophen (NORCO) 10-325 MG tablet 1/2 - 1 tab po bid prn pain 45 tablet 0   insulin aspart (NOVOLOG) 100 UNIT/ML injection INJECT 20-25 UNITS INTO THE SKIN 3 TIMES A DAY BEFORE MEALS 30 mL 5   Insulin Syringes, Disposable, U-100 0.3 ML MISC Use to inject insulin--6 injections per day 540 each 3   levocetirizine (XYZAL)  5 MG tablet Take 5 mg by mouth at bedtime as needed for allergies.   5   levothyroxine (SYNTHROID) 125 MCG tablet Take 1 tablet (125 mcg total) by mouth daily. 30 tablet 2   lisinopril (ZESTRIL) 10 MG tablet Take 1 tablet (10 mg total) by mouth daily. OFFICE VISIT NEEDED FOR FURTHER REFILLS 90 tablet 1   metFORMIN (GLUCOPHAGE) 500 MG tablet TAKE 1 TABLET BY MOUTH EVERY MORNING AND 2 TABLETS BY MOUTH AT BEDTIME. Strength: 500 mg 270 tablet 3   miconazole (MICOTIN) 2 % powder Apply 1 application topically 2 (two) times daily as needed for itching.     Mometasone Furo-Formoterol Fum (DULERA) 50-5 MCG/ACT AERO 2 puffs bid 3 each 1   mupirocin ointment (BACTROBAN) 2 % Apply 1 Application topically 3 (three) times daily. Apply to affected areas tid x 10d 15 g 0   NOVOLIN N 100 UNIT/ML injection Inject into the skin 3 (three) times daily.     pravastatin (PRAVACHOL) 40 MG tablet Take 1 tablet (40 mg total) by mouth daily. 90 tablet 1   Probiotic Product (PROBIOTIC PO) Take 1 capsule by mouth daily.     VITAMIN D PO Take by mouth.     No facility-administered medications prior to visit.    Allergies  Allergen Reactions   Adhesive [Tape] Other (See Comments)    Takes patient's skin off.   Banana Diarrhea and Nausea Only   Egg-Derived Products Diarrhea and Nausea Only   Latex Other (See Comments)    sensativity   Linzess [Linaclotide] Diarrhea   Ozempic (0.25 Or 0.5 Mg-Dose) [Semaglutide(0.25 Or 0.5mg -Dos)] Diarrhea   Penicillins Rash and Other (See Comments)    Has had  cephalosporins without incident   Pine Swelling and Rash   Rose Swelling and Rash    Review of Systems  As per HPI  PE:    12/20/2022   10:07 AM 12/20/2022   10:00 AM 12/02/2022   10:18 AM  Vitals with BMI  Height  5\' 1"  5\' 1"   Weight  254 lbs 6 oz 258 lbs 3 oz  BMI  48.09 48.81  Systolic 142 159 147  Diastolic 59 75 68  Pulse  101 829     Physical Exam  Gen: Alert, well appearing.  Patient is oriented to person, place, time, and situation. R LL with diffuse erythema, warmth, and tenderness from just below the knee down over the dorsum of foot.  No skin breakdown. No nodularity/abscess. R leg 2-3+ pitting edema, L leg no edema.  LABS:  Last CBC Lab Results  Component Value Date   WBC 8.9 02/02/2021   HGB 11.5 (L) 02/02/2021   HCT 34.9 (L) 02/02/2021   MCV 94.4 02/02/2021   MCH 34.6 (H) 09/20/2018   RDW 15.7 (H) 02/02/2021   PLT 224.0 02/02/2021   Lab Results  Component Value Date   IRON 49 02/02/2021   TIBC 364 02/02/2021   FERRITIN 31 02/02/2021   Lab Results  Component Value Date   VITAMINB12 454 10/28/2020   Last metabolic panel Lab Results  Component Value Date   GLUCOSE 95 12/31/2021   NA 140 12/31/2021   K 5.0 12/31/2021   CL 103 12/31/2021   CO2 28 12/31/2021   BUN 46 (H) 12/31/2021   CREATININE 0.99 12/31/2021   GFR 57.57 (L) 12/31/2021   CALCIUM 9.4 12/31/2021   PROT 7.1 09/02/2021   ALBUMIN 3.8 09/02/2021   BILITOT 0.2 09/02/2021   ALKPHOS 58 09/02/2021  AST 18 09/02/2021   ALT 16 09/02/2021   ANIONGAP 10 06/16/2020   Last hemoglobin A1c Lab Results  Component Value Date   HGBA1C 6.6 (A) 07/18/2022   IMPRESSION AND PLAN:  Right lower leg cellulitis. She has penicillin allergy. Clindamycin 300 mg 3 times daily x 10 days prescribed today. Signs/symptoms to call or return for were reviewed and pt expressed understanding.  An After Visit Summary was printed and given to the patient.  FOLLOW UP: Return for Keep appointment  already set for 12/28/2022.  Signed:  Santiago Bumpers, MD           12/20/2022

## 2022-12-23 ENCOUNTER — Other Ambulatory Visit: Payer: Self-pay | Admitting: Family Medicine

## 2022-12-26 NOTE — Telephone Encounter (Signed)
Pt has scheduled appt on 9/18

## 2022-12-28 ENCOUNTER — Ambulatory Visit: Payer: Medicare Other | Admitting: Family Medicine

## 2022-12-29 ENCOUNTER — Other Ambulatory Visit: Payer: Self-pay | Admitting: Family Medicine

## 2023-01-02 ENCOUNTER — Telehealth: Payer: Self-pay | Admitting: *Deleted

## 2023-01-02 ENCOUNTER — Telehealth: Payer: Self-pay | Admitting: Radiation Oncology

## 2023-01-02 ENCOUNTER — Ambulatory Visit
Admission: RE | Admit: 2023-01-02 | Discharge: 2023-01-02 | Disposition: A | Discharge status: Home / Self Care | Payer: Medicare Other | Source: Ambulatory Visit | Attending: Radiation Oncology | Admitting: Radiation Oncology

## 2023-01-02 NOTE — Telephone Encounter (Signed)
Left message for patient to call back to reschedule today's appointment per after-hours call log.

## 2023-01-02 NOTE — Telephone Encounter (Signed)
RETURNED PATIENT'S PHONE CALL, SPOKE WITH PATIENT. ?

## 2023-01-06 ENCOUNTER — Telehealth (INDEPENDENT_AMBULATORY_CARE_PROVIDER_SITE_OTHER): Payer: Medicare Other | Admitting: Family Medicine

## 2023-01-06 ENCOUNTER — Encounter: Payer: Self-pay | Admitting: Family Medicine

## 2023-01-06 DIAGNOSIS — E7800 Pure hypercholesterolemia, unspecified: Secondary | ICD-10-CM | POA: Diagnosis not present

## 2023-01-06 DIAGNOSIS — G894 Chronic pain syndrome: Secondary | ICD-10-CM | POA: Diagnosis not present

## 2023-01-06 DIAGNOSIS — M159 Polyosteoarthritis, unspecified: Secondary | ICD-10-CM

## 2023-01-06 DIAGNOSIS — E039 Hypothyroidism, unspecified: Secondary | ICD-10-CM

## 2023-01-06 DIAGNOSIS — I10 Essential (primary) hypertension: Secondary | ICD-10-CM | POA: Diagnosis not present

## 2023-01-06 DIAGNOSIS — R600 Localized edema: Secondary | ICD-10-CM

## 2023-01-06 DIAGNOSIS — M545 Low back pain, unspecified: Secondary | ICD-10-CM | POA: Diagnosis not present

## 2023-01-06 DIAGNOSIS — G8929 Other chronic pain: Secondary | ICD-10-CM

## 2023-01-06 MED ORDER — HYDROCODONE-ACETAMINOPHEN 10-325 MG PO TABS: ORAL_TABLET | ORAL | 0 refills | Status: DC

## 2023-01-06 MED ORDER — AMITRIPTYLINE HCL 25 MG PO TABS: 50.0000 mg | ORAL_TABLET | Freq: Every day | ORAL | 1 refills | Status: DC

## 2023-01-06 MED ORDER — LEVOTHYROXINE SODIUM 125 MCG PO TABS: 125.0000 ug | ORAL_TABLET | Freq: Every day | ORAL | 1 refills | Status: DC

## 2023-01-06 MED ORDER — PRAVASTATIN SODIUM 40 MG PO TABS: 40.0000 mg | ORAL_TABLET | Freq: Every day | ORAL | 1 refills | Status: DC

## 2023-01-06 MED ORDER — FUROSEMIDE 20 MG PO TABS: 20.0000 mg | ORAL_TABLET | Freq: Every day | ORAL | 1 refills | Status: DC

## 2023-01-06 NOTE — Progress Notes (Signed)
Virtual Visit via Video Note  I connected with [name]  on 01/06/23 at  1:40 PM EDT by a video enabled telemedicine application and verified that I am speaking with the correct person using two identifiers.  Location patient: [place] Location provider:work or home office Persons participating in the virtual visit: patient, provider  I discussed the limitations and requested verbal permission for telemedicine visit. The patient expressed understanding and agreed to proceed.  HPI: 72 year old female being seen today for 31-month follow-up chronic pain syndrome and hypertension. Morna is doing pretty well. She takes one half of a 10/325 Vicodin 1-2 times a day, occasionally a whole tab. Home blood pressure monitoring stable around 130/80.  Her recent episode of cellulitis completely resolved. Lower extremity swelling minimal and stable.  She uses Lasix 20 mg every day.  Indication for chronic opioid: osteoarthritis in hands, low back, feet, L hip, knees, wrists+chemotherapy -induced neuropathy (burning and numbness in feet). Takes 1-2 hydrocodone tabs a day typically.  Not NSAID candidate due to renal insufficiency. PMP AWARE reviewed today: most recent rx for Vicodin 10-325 was filled 11/09/2022, # #45, rx by me. No red flags.   ROS as above, plus--> no fevers, no CP, no SOB, no wheezing, no cough, no dizziness, no HAs, no rashes, no melena/hematochezia.  No polyuria or polydipsia.  No focal weakness, paresthesias, or tremors.  No acute vision or hearing abnormalities.  No dysuria or unusual/new urinary urgency or frequency.  No recent changes in lower legs. No n/v/d or abd pain.  No palpitations.     Past Medical History:  Diagnosis Date   Allergic rhinitis    Allergy testing: results pending as of 05/28/16 (Dr. Irena Cords)   Anemia 10/2020   anemia of chronic dz (? + mild IDA?--iron started   Arthritis    Chemotherapy-induced neuropathy (HCC) 2019   Cough variant asthma    DDD  (degenerative disc disease), lumbar    Diabetes mellitus with complication (HCC)    Mild nonprolif DR R eye 12/2017-->referred to retinal specialist DM MANAGED BY DR. GHERGHE AS OF 2021   Endometrial cancer (HCC) 02/2018   REMISSION AS OF 08/2018.  Stage III (T3, Nx, Mx)  Path: endometroid adenocarcinoma involving cervix, uterus, and fallopian tubes (Pt got TAH & BSO 02/2018). No sign of recurrence as of onc f/u 05/2019.   GERD (gastroesophageal reflux disease)    Hemorrhoids    Herpes zoster 10/2015   L side belt-line   History of adenomatous polyp of colon 02/25/2013   5 mm cecal polyp removed by Dr. Jacquelynn Cree 5 yrs   History of blood transfusion    History of cellulitis 08/2010   Left (Since replacemnt of Left knee   History of radiation therapy 05/21/2018   HDR [facility] brachytherapy 04/24/2018-05/21/2018  Dr Antony Blackbird   Hyperkalemia 03/2018   started after pt started getting chemo-->had to stop enalapril.   Hyperlipidemia    Not on statin b/c lipids "stayed down when sugars came down" per pt.  She says Dr. Talmage Nap knows she is not on statin anymore.   Hypertension    Hypothyroidism    Nephrolithiasis 07/2005   Obesity    OSA on CPAP    Osteoarthritis    knees, ankle, + ? scapholunate ligament disruption (x-ray 06/2017).  Progressive arthritis L wrist and hand 09/2022 radiographs   Pancytopenia due to antineoplastic chemotherapy [facility])    progressive as of 06/2018. Stable 08/2018.   Recurrent UTI    started cipro 250  qd 09/2018   Sciatica of left side    Severe persistent asthma    cough-variant--saw Allergist 05/25/16 and was switched from max dose advair to symbicort.   Systolic murmur    ECHO fine 10/2016-->murmur likely flow murmur assoc with HTN.   Zoon's vulvitis    Bx-proven (GYN) -lichen sclerosis.  Clobetasol 0.05% ointment per GYN    Past Surgical History:  Procedure Laterality Date   ANKLE FRACTURE SURGERY  1984   Pin & repair   ARTHROSCOPIC REPAIR ACL  2000   Due to ACL  tear   ARTHROSCOPIC REPAIR ACL  5/08   Blateral knee rerlacements x4 2 times each knee     BREAST BIOPSY Right 2014   Benign   CARDIAC CATHETERIZATION  5/07   clear vessel mild mitral    CARPAL TUNNEL RELEASE Right [id number]   1989 Left   CESAREAN SECTION  1985   CHOLECYSTECTOMY OPEN  1988   COLONOSCOPY N/A 02/25/2013   Tubular adenoma x 1: Recall 5 yrs. Procedure: COLONOSCOPY;  Surgeon: Charna Elizabeth, MD;  Location: [facility];  Service: Endoscopy;  Laterality: N/A;   COMBINED HYSTEROSCOPY DIAGNOSTIC / D&C  2/12   Bx neg   DEXA  08/2007; 05/12/20   Bone density normal 2009 and 2022.  Plan rpt 2024.   DILATION AND CURETTAGE OF UTERUS  12/11   IR IMAGING GUIDED PORT INSERTION  03/16/2018   IR REMOVAL TUN ACCESS W/ PORT W/O FL MOD SED  09/20/2018   LYMPH NODE BIOPSY N/A 02/19/2018   Procedure: Sentinel LYMPH NODE BIOPSY;  Surgeon: Adolphus Birchwood, MD;  Location: [facility];  Service: Gynecology;  Laterality: N/A;   REPLACEMENT TOTAL KNEE Left 5/12   X 2 on each   ROBOTIC ASSISTED TOTAL HYSTERECTOMY WITH BILATERAL SALPINGO OOPHERECTOMY N/A 02/19/2018   For endometrial cancer.  Procedure: XI ROBOTIC ASSISTED TOTAL HYSTERECTOMY WITH BILATERAL SALPINGO OOPHORECTOMY;  Surgeon: Adolphus Birchwood, MD;  Location: [facility];  Service: Gynecology;  Laterality: N/A;   TRANSTHORACIC ECHOCARDIOGRAM  10/11/2016    EF 55-60%, grd I DD.   TUBAL LIGATION  1985   C-Section     Current Outpatient Medications:    albuterol (PROVENTIL) (2.5 MG/3ML) 0.083% nebulizer solution, Take 3 mLs (2.5 mg total) by nebulization every 4 (four) hours as needed for wheezing or shortness of breath (Dx: J45.50)., Disp: 75 mL, Rfl: 1   albuterol (VENTOLIN HFA) 108 (90 Base) MCG/ACT inhaler, INHALE 2 PUFFS INTO LUNGS EVERY 6 HOURS AS NEEDED FOR WHEEZING/SHORTNESS OF BREATH, Disp: 18 g, Rfl: 1   amitriptyline (ELAVIL) 25 MG tablet, Take 2 tablets (50 mg total) by mouth at bedtime., Disp: 180 tablet, Rfl:  1   Apoaequorin (PREVAGEN PO), Take by mouth daily., Disp: , Rfl:    clobetasol ointment (TEMOVATE) 0.05 %, Apply 1 Application topically as needed. Apply to the skin of the vulva for itching, Disp: 30 g, Rfl: 2   diclofenac Sodium (VOLTAREN) 1 % GEL, Apply 2 g topically 4 (four) times daily., Disp: 100 g, Rfl: 3   felodipine (PLENDIL) 10 MG 24 hr tablet, Take 1 tablet (10 mg total) by mouth daily. OFFICE VISIT NEEDED FOR FURTHER REFILLS, Disp: 90 tablet, Rfl: 1   Ferrous Sulfate (IRON PO), Take by mouth., Disp: , Rfl:    FIASP FLEXTOUCH 100 UNIT/ML FlexTouch Pen, SMARTSIG:20-25 Unit(s) SUB-Q 3 Times Daily, Disp: , Rfl:    fluticasone (CUTIVATE) 0.05 % cream, APPLY TO AFFECTED AREA OF RIGHT LOWER LEG  TWICE PER DAY., Disp: 60 g, Rfl: 1   fluticasone (VERAMYST) 27.5 MCG/SPRAY nasal spray, Place 1 spray into the nose daily as needed for rhinitis., Disp: , Rfl:    furosemide (LASIX) 20 MG tablet, Take 1 tablet (20 mg total) by mouth daily. MUST KEEP APPT FOR FURTHER REFILLS, Disp: 30 tablet, Rfl: 0   HYDROcodone-acetaminophen (NORCO) 10-325 MG tablet, 1/2 - 1 tab po bid prn pain, Disp: 45 tablet, Rfl: 0   insulin aspart (NOVOLOG) 100 UNIT/ML injection, INJECT 20-25 UNITS INTO THE SKIN 3 TIMES A DAY BEFORE MEALS, Disp: 30 mL, Rfl: 5   Insulin Syringes, Disposable, U-100 0.3 ML MISC, Use to inject insulin--6 injections per day, Disp: 540 each, Rfl: 3   levocetirizine (XYZAL) 5 MG tablet, Take 5 mg by mouth at bedtime as needed for allergies. , Disp: , Rfl: 5   levothyroxine (SYNTHROID) 125 MCG tablet, Take 1 tablet (125 mcg total) by mouth daily. MUST KEEP APPT FOR FURTHER REFILLS, Disp: 30 tablet, Rfl: 0   lisinopril (ZESTRIL) 10 MG tablet, Take 1 tablet (10 mg total) by mouth daily. OFFICE VISIT NEEDED FOR FURTHER REFILLS, Disp: 90 tablet, Rfl: 1   metFORMIN (GLUCOPHAGE) 500 MG tablet, TAKE 1 TABLET BY MOUTH EVERY MORNING AND 2 TABLETS BY MOUTH AT BEDTIME. Strength: 500 mg, Disp: 270 tablet, Rfl: 3    miconazole (MICOTIN) 2 % powder, Apply 1 application topically 2 (two) times daily as needed for itching., Disp: , Rfl:    Mometasone Furo-Formoterol Fum (DULERA) 50-5 MCG/ACT AERO, 2 puffs bid, Disp: 3 each, Rfl: 1   mupirocin ointment (BACTROBAN) 2 %, Apply 1 Application topically 3 (three) times daily. Apply to affected areas tid x 10d, Disp: 15 g, Rfl: 0   NOVOLIN N 100 UNIT/ML injection, Inject into the skin 3 (three) times daily., Disp: , Rfl:    pravastatin (PRAVACHOL) 40 MG tablet, Take 1 tablet (40 mg total) by mouth daily., Disp: 90 tablet, Rfl: 1   Probiotic Product (PROBIOTIC PO), Take 1 capsule by mouth daily., Disp: , Rfl:    VITAMIN D PO, Take by mouth., Disp: , Rfl:   EXAM:  VITALS per patient if applicable:     12/20/2022   10:07 AM 12/20/2022   10:00 AM 12/02/2022   10:18 AM  Vitals with BMI  Height  5\' 1"  5\' 1"   Weight  254 lbs 6 oz 258 lbs 3 oz  BMI  48.09 48.81  Systolic 142 159 914  Diastolic 59 75 68  Pulse  101 782    GENERAL: alert, oriented, appears well and in no acute distress  HEENT: atraumatic, conjunttiva clear, no obvious abnormalities on inspection of external nose and ears  NECK: normal movements of the head and neck  LUNGS: on inspection no signs of respiratory distress, breathing rate appears normal, no obvious gross SOB, gasping or wheezing  CV: no obvious cyanosis  MS: moves all visible extremities without noticeable abnormality  PSYCH/NEURO: pleasant and cooperative, no obvious depression or anxiety, speech and thought processing grossly intact  LABS: none today    Chemistry      Component Value Date/Time   NA 140 12/31/2021 1404   K 5.0 12/31/2021 1404   CL 103 12/31/2021 1404   CO2 28 12/31/2021 1404   BUN 46 (H) 12/31/2021 1404   CREATININE 0.99 12/31/2021 1404   CREATININE 1.15 (H) 12/17/2021 1441      Component Value Date/Time   CALCIUM 9.4 12/31/2021 1404   ALKPHOS 58  09/02/2021 1411   AST 18 09/02/2021 1411   AST 38  07/17/2018 1028   ALT 16 09/02/2021 1411   ALT 28 07/17/2018 1028   BILITOT 0.2 09/02/2021 1411   BILITOT 0.4 07/17/2018 1028     Lab Results  Component Value Date   WBC 8.9 02/02/2021   HGB 11.5 (L) 02/02/2021   HCT 34.9 (L) 02/02/2021   MCV 94.4 02/02/2021   PLT 224.0 02/02/2021   Lab Results  Component Value Date   IRON 49 02/02/2021   TIBC 364 02/02/2021   FERRITIN 31 02/02/2021   Lab Results  Component Value Date   VITAMINB12 454 10/28/2020   Lab Results  Component Value Date   CHOL 109 02/02/2021   HDL 37.80 (L) 02/02/2021   LDLCALC 41 02/02/2021   LDLDIRECT 78.0 10/05/2018   TRIG 150.0 (H) 02/02/2021   CHOLHDL 3 02/02/2021   Lab Results  Component Value Date   TSH 0.76 02/02/2021   ASSESSMENT AND PLAN:  Discussed the following assessment and plan:  #1 chronic pain syndrome.  Chronic bilateral low back pain without sciatica, also osteoarthritis of multiple sites. Stable. Continue hydrocodone/APAP 10/325, 1/2-1 twice daily as needed, #45.  New prescription sent today.  2.  Hypertension, well-controlled on felodipine 10 mg a day and lisinopril 10 mg a day.  3.  Hypercholesterolemia, doing well on Pravachol 40 mg a day.  #4 acquired hypothyroidism. Continue 125 mcg levothyroxine daily.  #5 bilateral lower extremity edema. Doing well on Lasix 20 mg a day. Due for c-Met, lipids, CBC, and TSH--- ordered, and she will make future lab appointment.   #6 preventative health care: Last colonoscopy 2014, 5-year recall. Breast cancer screening: Last mammogram in EMR is February 2022.  I discussed the assessment and treatment plan with the patient. The patient was provided an opportunity to ask questions and all were answered. The patient agreed with the plan and demonstrated an understanding of the instructions.   F/U: 3 months office visit.   Fasting lab visit at earliest convenience.  Signed:  Santiago Bumpers, MD           01/06/2023

## 2023-01-07 ENCOUNTER — Other Ambulatory Visit: Payer: Self-pay | Admitting: Family Medicine

## 2023-01-15 ENCOUNTER — Other Ambulatory Visit: Payer: Self-pay | Admitting: Gynecologic Oncology

## 2023-01-15 DIAGNOSIS — N904 Leukoplakia of vulva: Secondary | ICD-10-CM

## 2023-01-18 ENCOUNTER — Ambulatory Visit (INDEPENDENT_AMBULATORY_CARE_PROVIDER_SITE_OTHER): Payer: Medicare Other | Admitting: Internal Medicine

## 2023-01-18 ENCOUNTER — Encounter: Payer: Self-pay | Admitting: Internal Medicine

## 2023-01-18 VITALS — BP 130/70 | HR 92 | Ht 61.0 in | Wt 249.0 lb

## 2023-01-18 DIAGNOSIS — E1142 Type 2 diabetes mellitus with diabetic polyneuropathy: Secondary | ICD-10-CM

## 2023-01-18 DIAGNOSIS — Z7984 Long term (current) use of oral hypoglycemic drugs: Secondary | ICD-10-CM | POA: Diagnosis not present

## 2023-01-18 DIAGNOSIS — E785 Hyperlipidemia, unspecified: Secondary | ICD-10-CM

## 2023-01-18 DIAGNOSIS — E1165 Type 2 diabetes mellitus with hyperglycemia: Secondary | ICD-10-CM | POA: Diagnosis not present

## 2023-01-18 LAB — POCT GLYCOSYLATED HEMOGLOBIN (HGB A1C): Hemoglobin A1C: 6.4 % — AB (ref 4.0–5.6)

## 2023-01-18 LAB — MICROALBUMIN / CREATININE URINE RATIO
Creatinine,U: 45.5 mg/dL
Microalb Creat Ratio: 36.2 mg/g — ABNORMAL HIGH (ref 0.0–30.0)
Microalb, Ur: 16.5 mg/dL — ABNORMAL HIGH (ref 0.0–1.9)

## 2023-01-18 MED ORDER — INSULIN PEN NEEDLE 32G X 4 MM MISC: 3 refills | Status: DC

## 2023-01-18 NOTE — Addendum Note (Signed)
Addended by: Pollie Meyer on: 01/18/2023 03:53 PM   Modules accepted: Orders

## 2023-01-18 NOTE — Patient Instructions (Addendum)
Please continue: - Metformin 500 mg in am and 1000 mg with dinner  Please use the following regimen: Insulin Before breakfast Before lunch Before dinner Bedtime  Fiasp 18-20 10-15 15-20 -  NPH 20 15 - 35   Please stop at the lab.  Please come back for a follow-up appointment in 4-6 months.

## 2023-01-18 NOTE — Progress Notes (Signed)
Radiation Oncology         [phone] ________________________________  Name: [name] MRN: [id number]  Date: 01/19/2023  DOB: [birth year]  Follow-Up Visit Note  CC: McGowen, Maryjean Morn, MD  Adolphus Birchwood, MD  No diagnosis found.  Diagnosis: Stage IIIA endometroid endometrial carcinoma   Interval Since Last Radiation: 4 years and 8 months  Radiation treatment dates: 04/24/2018, 05/03/2018, 05/07/2018, 05/16/2018, 05/21/2018   Site/dose:  Vaginal cuff, 6 Gy in 5 fractions for a total dose of 30 Gy  Narrative:  The patient returns today for routine follow-up. She was last seen here for follow-up on 12/23/21.    Since her last visit, the patient followed up with Dr. Tamela Oddi on 06/15/22. During which time, she denied any symptoms concerning for disease recurrence and was noted as NED on examination. GU exam did note some waxy areas, large erosive areas about the labia minora, and thick white discharge. Findings were thought to be suggestive of an acute, complicated VVC and she was prescribed a course of Diflucan to treat this empirically.   No other significant oncologic interval history since the patient was last seen for follow-up.   ***   Allergies:  is allergic to adhesive [tape], banana, egg-derived products, latex, linzess [linaclotide], ozempic (0.25 or 0.5 mg-dose) [semaglutide(0.25 or 0.5mg -dos)], penicillins, pine, and rose.  Meds: Current Outpatient Medications  Medication Sig Dispense Refill   albuterol (PROVENTIL) (2.5 MG/3ML) 0.083% nebulizer solution Take 3 mLs (2.5 mg total) by nebulization every 4 (four) hours as needed for wheezing or shortness of breath (Dx: J45.50). 75 mL 1   albuterol (VENTOLIN HFA) 108 (90 Base) MCG/ACT inhaler INHALE 2 PUFFS INTO LUNGS EVERY 6 HOURS AS NEEDED FOR WHEEZING/SHORTNESS OF BREATH 18 g 1   amitriptyline (ELAVIL) 25 MG tablet Take 2 tablets (50 mg total) by mouth at bedtime. 180 tablet 1   Apoaequorin (PREVAGEN PO) Take by  mouth daily.     clobetasol ointment (TEMOVATE) 0.05 % APPLY 1 APPLICATION TOPICALLY AS NEEDED. APPLY TO THE SKIN OF THE VULVA FOR ITCHING 30 g 2   diclofenac Sodium (VOLTAREN) 1 % GEL Apply 2 g topically 4 (four) times daily. 100 g 3   felodipine (PLENDIL) 10 MG 24 hr tablet Take 1 tablet (10 mg total) by mouth daily. 90 tablet 0   Ferrous Sulfate (IRON PO) Take by mouth.     FIASP FLEXTOUCH 100 UNIT/ML FlexTouch Pen SMARTSIG:20-25 Unit(s) SUB-Q 3 Times Daily     fluticasone (CUTIVATE) 0.05 % cream APPLY TO AFFECTED AREA OF RIGHT LOWER LEG TWICE PER DAY. 60 g 1   fluticasone (VERAMYST) 27.5 MCG/SPRAY nasal spray Place 1 spray into the nose daily as needed for rhinitis.     furosemide (LASIX) 20 MG tablet Take 1 tablet (20 mg total) by mouth daily. 90 tablet 1   HYDROcodone-acetaminophen (NORCO) 10-325 MG tablet 1/2 - 1 tab po bid prn pain 45 tablet 0   Insulin Pen Needle 32G X 4 MM MISC Use 3x a day 300 each 3   Insulin Syringes, Disposable, U-100 0.3 ML MISC Use to inject insulin--6 injections per day 540 each 3   levocetirizine (XYZAL) 5 MG tablet Take 5 mg by mouth at bedtime as needed for allergies.   5   levothyroxine (SYNTHROID) 125 MCG tablet Take 1 tablet (125 mcg total) by mouth daily. 90 tablet 1   lisinopril (ZESTRIL) 10 MG tablet Take 1 tablet (10 mg total) by mouth daily. 90 tablet 0  metFORMIN (GLUCOPHAGE) 500 MG tablet TAKE 1 TABLET BY MOUTH EVERY MORNING AND 2 TABLETS BY MOUTH AT BEDTIME. Strength: 500 mg 270 tablet 3   miconazole (MICOTIN) 2 % powder Apply 1 application topically 2 (two) times daily as needed for itching.     Mometasone Furo-Formoterol Fum (DULERA) 50-5 MCG/ACT AERO 2 puffs bid 3 each 1   mupirocin ointment (BACTROBAN) 2 % Apply 1 Application topically 3 (three) times daily. Apply to affected areas tid x 10d 15 g 0   NOVOLIN N 100 UNIT/ML injection Inject into the skin 3 (three) times daily. 20 units for breakfast and lunch, 35 units at Bedtime     pravastatin  (PRAVACHOL) 40 MG tablet Take 1 tablet (40 mg total) by mouth daily. 90 tablet 1   Probiotic Product (PROBIOTIC PO) Take 1 capsule by mouth daily.     VITAMIN D PO Take by mouth.     No current facility-administered medications for this encounter.    Physical Findings: The patient is in no acute distress. Patient is alert and oriented.  vitals were not taken for this visit. .  No significant changes. Lungs are clear to auscultation bilaterally. Heart has regular rate and rhythm. No palpable cervical, supraclavicular, or axillary adenopathy. Abdomen soft, non-tender, normal bowel sounds.  On pelvic examination the external genitalia were unremarkable. A speculum exam was performed. There are no mucosal lesions noted in the vaginal vault. A Pap smear was obtained of the proximal vagina. On bimanual and rectovaginal examination there were no pelvic masses appreciated. ***   Lab Findings: Lab Results  Component Value Date   WBC 8.9 02/02/2021   HGB 11.5 (L) 02/02/2021   HCT 34.9 (L) 02/02/2021   MCV 94.4 02/02/2021   PLT 224.0 02/02/2021    Radiographic Findings: No results found.  Impression: Stage IIIA endometroid endometrial carcinoma   The patient is recovering from the effects of radiation.  ***  Plan:  ***   *** minutes of total time was spent for this patient encounter, including preparation, face-to-face counseling with the patient and coordination of care, physical exam, and documentation of the encounter. ____________________________________  Billie Lade, PhD, MD  This document serves as a record of services personally performed by Antony Blackbird, MD. It was created on his behalf by Neena Rhymes, a trained medical scribe. The creation of this record is based on the scribe's personal observations and the provider's statements to them. This document has been checked and approved by the attending provider.

## 2023-01-18 NOTE — Progress Notes (Signed)
ID: [name], female   DOB: February 21, 1951, 72 y.o.   MRN: [id number]   HPI: [name] is a 72 y.o.-year-old female, initially referred by her PCP, Dr. Marvel Plan, returning for follow-up for DM2, dx in 1992, insulin-dependent since 2004-2005, uncontrolled, with complications (DR).  Last visit 6 months ago.   On [employer].  Interim history: No increased urination, blurry vision, nausea, chest pain. She had a UTI then the flu. She also fell few times - bruises on her arms.  Reviewed HbA1c levels: Lab Results  Component Value Date   HGBA1C 6.6 (A) 07/18/2022   HGBA1C 6.1 (A) 11/29/2021   HGBA1C 6.4 (A) 07/26/2021   HGBA1C 6.6 (A) 03/08/2021   HGBA1C 6.3 (A) 09/01/2020   HGBA1C 9.1 (A) 05/15/2020   HGBA1C 7.7 (A) 12/26/2019   HGBA1C 6.6 (A) 05/07/2019   HGBA1C 8.8 (H) 01/21/2019   HGBA1C 7.5 (H) 10/05/2018   She is on: - Metformin 500 mg in am and 1000 mg with dinner Insulin Before breakfast Before lunch Before dinner Bedtime  Fiasp 22-25 >> 18-20 10-15 15-20 -  NPH 20 15 - 35   She tried Ozempic 0.5 mg weekly >> significant diarrhea, abd. Pain, food intolerance >> started 04/2019 -stopped 07/2019 mainly because of lack of coverage.  She is checking more than 4 times a day with her CGM.  She has to use skin tac to be able to tolerate the adhesive:   Previously:  Previously:  Lowest sugar was  54 >> 50s >> 53 >> 62; she has hypoglycemia awareness in the 70s.  Highest sugar was  243 >> 300 >> 300s >> 310  Glucometer: [facility]  Pt's meals are: - Breakfast: oatmeal - instant, peacans, butter >> sausage, bacon, apple sauce, velveeta crackers - Lunch: tortilla + PB and J or [place] and cheese >> yoghurt, apple sauce, half a sandwich - Dinner: meat (hamburger, chicken, bratwurst), spaghetti, green beans, mixed veggies - Snacks:   -No CKD, last BUN/creatinine:  Lab Results  Component Value Date   BUN 46 (H) 12/31/2021   BUN 58 (H) 12/17/2021   CREATININE 0.99 12/31/2021    CREATININE 1.15 (H) 12/17/2021  No results found for: "MICRALBCREAT" -+ HL; last set of lipids: Lab Results  Component Value Date   CHOL 109 02/02/2021   HDL 37.80 (L) 02/02/2021   LDLCALC 41 02/02/2021   LDLDIRECT 78.0 10/05/2018   TRIG 150.0 (H) 02/02/2021   CHOLHDL 3 02/02/2021  On pravastatin 20.  - last eye exam was on 2023: No DR  (previously:+ DR).  She had cataract surgery 02/23/2021.  -+ numbness and tingling in her feet -chemotherapy related.  Last foot exam 11/29/2021.  Pt has FH of DM in daughter, brother, father, P aunts.  She also has a history of endometrial cancer - 2019, also, OSA, chemotherapy-induced peripheral neuropathy, hypothyroidism-on levothyroxine 125 mcg daily.  Latest TSH was normal: Lab Results  Component Value Date   TSH 0.76 02/02/2021   She lost a child at 74 y/o in 43 >> she gained 100 lbs and was dx'ed with DM afterwards.  No h/o pancreatitis or FH of MTC.  Brother died of Lewy body dementia.  ROS: + see HPI  I reviewed pt's medications, allergies, PMH, social hx, family hx, and changes were documented in the history of present illness. Otherwise, unchanged from my initial visit note.  Past Medical History:  Diagnosis Date   Allergic rhinitis    Allergy testing: results pending as of 05/28/16 (  Dr. Irena Cords)   Anemia 10/2020   anemia of chronic dz (? + mild IDA?--iron started   Arthritis    Chemotherapy-induced neuropathy (HCC) 2019   Cough variant asthma    DDD (degenerative disc disease), lumbar    Diabetes mellitus with complication (HCC)    Mild nonprolif DR R eye 12/2017-->referred to retinal specialist DM MANAGED BY DR. Quanna Wittke AS OF 2021   Endometrial cancer (HCC) 02/2018   REMISSION AS OF 08/2018.  Stage III (T3, Nx, Mx)  Path: endometroid adenocarcinoma involving cervix, uterus, and fallopian tubes (Pt got TAH & BSO 02/2018). No sign of recurrence as of onc f/u 05/2019.   GERD (gastroesophageal reflux disease)    Hemorrhoids     Herpes zoster 10/2015   L side belt-line   History of adenomatous polyp of colon 02/25/2013   5 mm cecal polyp removed by Dr. Jacquelynn Cree 5 yrs   History of blood transfusion    History of cellulitis 08/2010   Left (Since replacemnt of Left knee   History of radiation therapy 05/21/2018   [facility] brachytherapy 04/24/2018-05/21/2018  Dr Antony Blackbird   Hyperkalemia 03/2018   started after pt started getting chemo-->had to stop enalapril.   Hyperlipidemia    Not on statin b/c lipids "stayed down when sugars came down" per pt.  She says Dr. Talmage Nap knows she is not on statin anymore.   Hypertension    Hypothyroidism    Nephrolithiasis 07/2005   Obesity    OSA on CPAP    Osteoarthritis    knees, ankle, + ? scapholunate ligament disruption (x-ray 06/2017).  Progressive arthritis L wrist and hand 09/2022 radiographs   Pancytopenia due to antineoplastic chemotherapy [facility])    progressive as of 06/2018. Stable 08/2018.   Recurrent UTI    started cipro 250 qd 09/2018   Sciatica of left side    Severe persistent asthma    cough-variant--saw Allergist 05/25/16 and was switched from max dose advair to symbicort.   Systolic murmur    ECHO fine 10/2016-->murmur likely flow murmur assoc with HTN.   Zoon's vulvitis    Bx-proven (GYN) -lichen sclerosis.  Clobetasol 0.05% ointment per GYN   Past Surgical History:  Procedure Laterality Date   ANKLE FRACTURE SURGERY  1984   Pin & repair   ARTHROSCOPIC REPAIR ACL  2000   Due to ACL tear   ARTHROSCOPIC REPAIR ACL  5/08   Blateral knee rerlacements x4 2 times each knee     BREAST BIOPSY Right 2014   Benign   CARDIAC CATHETERIZATION  5/07   clear vessel mild mitral    CARPAL TUNNEL RELEASE Right [id number]   1989 Left   CESAREAN SECTION  1985   CHOLECYSTECTOMY OPEN  1988   COLONOSCOPY N/A 02/25/2013   Tubular adenoma x 1: Recall 5 yrs. Procedure: COLONOSCOPY;  Surgeon: Charna Elizabeth, MD;  Location: [facility];  Service: Endoscopy;  Laterality: N/A;    COMBINED HYSTEROSCOPY DIAGNOSTIC / D&C  2/12   Bx neg   DEXA  08/2007; 05/12/20   Bone density normal 2009 and 2022.  Plan rpt 2024.   DILATION AND CURETTAGE OF UTERUS  12/11   IR IMAGING GUIDED PORT INSERTION  03/16/2018   IR REMOVAL TUN ACCESS W/ PORT W/O FL MOD SED  09/20/2018   LYMPH NODE BIOPSY N/A 02/19/2018   Procedure: Sentinel LYMPH NODE BIOPSY;  Surgeon: Adolphus Birchwood, MD;  Location: [facility];  Service: Gynecology;  Laterality: N/A;  REPLACEMENT TOTAL KNEE Left 5/12   X 2 on each   ROBOTIC ASSISTED TOTAL HYSTERECTOMY WITH BILATERAL SALPINGO OOPHERECTOMY N/A 02/19/2018   For endometrial cancer.  Procedure: XI ROBOTIC ASSISTED TOTAL HYSTERECTOMY WITH BILATERAL SALPINGO OOPHORECTOMY;  Surgeon: Adolphus Birchwood, MD;  Location: [facility];  Service: Gynecology;  Laterality: N/A;   TRANSTHORACIC ECHOCARDIOGRAM  10/11/2016    EF 55-60%, grd I DD.   TUBAL LIGATION  1985   C-Section   Social History   Socioeconomic History   Marital status: Married    Spouse name: Harvie Heck   Number of children: 2   Years of education: Not on file   Highest education level: Bachelor's degree (e.g., BA, AB, BS)  Occupational History   Occupation: retired Charity fundraiser  Tobacco Use   Smoking status: Never   Smokeless tobacco: Never  Vaping Use   Vaping status: Never Used  Substance and Sexual Activity   Alcohol use: No    Alcohol/week: 0.0 standard drinks of alcohol   Drug use: No   Sexual activity: Never    Partners: Male    Birth control/protection: I.U.D.  Other Topics Concern   Not on file  Social History Narrative   Not on file   Social Determinants of Health   Financial Resource Strain: Low Risk  (06/07/2022)   Overall Financial Resource Strain (CARDIA)    Difficulty of Paying Living Expenses: Not hard at all  Food Insecurity: No Food Insecurity (06/07/2022)   Hunger Vital Sign    Worried About Running Out of Food in the Last Year: Never true    Ran Out of Food in the  Last Year: Never true  Transportation Needs: No Transportation Needs (06/07/2022)   PRAPARE - Administrator, Civil Service (Medical): No    Lack of Transportation (Non-Medical): No  Physical Activity: Patient Declined (06/07/2022)   Exercise Vital Sign    Days of Exercise per Week: Patient declined    Minutes of Exercise per Session: Patient declined  Recent Concern: Physical Activity - Inactive (03/15/2022)   Exercise Vital Sign    Days of Exercise per Week: 0 days    Minutes of Exercise per Session: 0 min  Stress: No Stress Concern Present (06/07/2022)   [employer] of Occupational Health - Occupational Stress Questionnaire    Feeling of Stress : Not at all  Social Connections: Socially Integrated (06/07/2022)   Social Connection and Isolation Panel [NHANES]    Frequency of Communication with Friends and Family: More than three times a week    Frequency of Social Gatherings with Friends and Family: Once a week    Attends Religious Services: 1 to 4 times per year    Active Member of [employer] or Organizations: Yes    Attends Banker Meetings: Patient declined    Marital Status: Married  Catering manager Violence: Not At Risk (06/08/2022)   Humiliation, Afraid, Rape, and Kick questionnaire    Fear of Current or Ex-Partner: No    Emotionally Abused: No    Physically Abused: No    Sexually Abused: No   Current Outpatient Medications on File Prior to Visit  Medication Sig Dispense Refill   albuterol (PROVENTIL) (2.5 MG/3ML) 0.083% nebulizer solution Take 3 mLs (2.5 mg total) by nebulization every 4 (four) hours as needed for wheezing or shortness of breath (Dx: J45.50). 75 mL 1   albuterol (VENTOLIN HFA) 108 (90 Base) MCG/ACT inhaler INHALE 2 PUFFS INTO LUNGS EVERY 6 HOURS AS NEEDED  FOR WHEEZING/SHORTNESS OF BREATH 18 g 1   amitriptyline (ELAVIL) 25 MG tablet Take 2 tablets (50 mg total) by mouth at bedtime. 180 tablet 1   Apoaequorin (PREVAGEN PO) Take by mouth  daily.     clobetasol ointment (TEMOVATE) 0.05 % APPLY 1 APPLICATION TOPICALLY AS NEEDED. APPLY TO THE SKIN OF THE VULVA FOR ITCHING 30 g 2   diclofenac Sodium (VOLTAREN) 1 % GEL Apply 2 g topically 4 (four) times daily. 100 g 3   felodipine (PLENDIL) 10 MG 24 hr tablet Take 1 tablet (10 mg total) by mouth daily. 90 tablet 0   Ferrous Sulfate (IRON PO) Take by mouth.     FIASP FLEXTOUCH 100 UNIT/ML FlexTouch Pen SMARTSIG:20-25 Unit(s) SUB-Q 3 Times Daily     fluticasone (CUTIVATE) 0.05 % cream APPLY TO AFFECTED AREA OF RIGHT LOWER LEG TWICE PER DAY. 60 g 1   fluticasone (VERAMYST) 27.5 MCG/SPRAY nasal spray Place 1 spray into the nose daily as needed for rhinitis.     furosemide (LASIX) 20 MG tablet Take 1 tablet (20 mg total) by mouth daily. 90 tablet 1   HYDROcodone-acetaminophen (NORCO) 10-325 MG tablet 1/2 - 1 tab po bid prn pain 45 tablet 0   insulin aspart (NOVOLOG) 100 UNIT/ML injection INJECT 20-25 UNITS INTO THE SKIN 3 TIMES A DAY BEFORE MEALS 30 mL 5   Insulin Syringes, Disposable, U-100 0.3 ML MISC Use to inject insulin--6 injections per day 540 each 3   levocetirizine (XYZAL) 5 MG tablet Take 5 mg by mouth at bedtime as needed for allergies.   5   levothyroxine (SYNTHROID) 125 MCG tablet Take 1 tablet (125 mcg total) by mouth daily. 90 tablet 1   lisinopril (ZESTRIL) 10 MG tablet Take 1 tablet (10 mg total) by mouth daily. 90 tablet 0   metFORMIN (GLUCOPHAGE) 500 MG tablet TAKE 1 TABLET BY MOUTH EVERY MORNING AND 2 TABLETS BY MOUTH AT BEDTIME. Strength: 500 mg 270 tablet 3   miconazole (MICOTIN) 2 % powder Apply 1 application topically 2 (two) times daily as needed for itching.     Mometasone Furo-Formoterol Fum (DULERA) 50-5 MCG/ACT AERO 2 puffs bid 3 each 1   mupirocin ointment (BACTROBAN) 2 % Apply 1 Application topically 3 (three) times daily. Apply to affected areas tid x 10d 15 g 0   NOVOLIN N 100 UNIT/ML injection Inject into the skin 3 (three) times daily.     pravastatin  (PRAVACHOL) 40 MG tablet Take 1 tablet (40 mg total) by mouth daily. 90 tablet 1   Probiotic Product (PROBIOTIC PO) Take 1 capsule by mouth daily.     VITAMIN D PO Take by mouth.     No current facility-administered medications on file prior to visit.   Allergies  Allergen Reactions   Adhesive [Tape] Other (See Comments)    Takes patient's skin off.   Banana Diarrhea and Nausea Only   Egg-Derived Products Diarrhea and Nausea Only   Latex Other (See Comments)    sensativity   Linzess [Linaclotide] Diarrhea   Ozempic (0.25 Or 0.5 Mg-Dose) [Semaglutide(0.25 Or 0.5mg -Dos)] Diarrhea   Penicillins Rash and Other (See Comments)    Has had cephalosporins without incident   Pine Swelling and Rash   Rose Swelling and Rash   Family History  Problem Relation Age of Onset   Diabetes Father    COPD Father    Stroke Father    Celiac disease Brother    Congenital heart disease Brother  Rheum arthritis Brother    Thyroid disease Brother    Diabetes Brother    Dementia Brother        Lewy Body   Alzheimer's disease Mother    Arthritis Mother    Other Son        Died age 93 -Tetrology of Fallot - VSD/pulmonary atresia   Breast cancer Other        postmenopausal when diagnosed   PE: BP 130/70   Pulse 92   Ht 5\' 1"  (1.549 m)   Wt 249 lb (112.9 kg)   LMP 08/10/2010 Comment: Hysterectomy 02/02/18  SpO2 98%   BMI 47.05 kg/m  Wt Readings from Last 10 Encounters:  01/18/23 249 lb (112.9 kg)  12/20/22 254 lb 6.4 oz (115.4 kg)  12/02/22 258 lb 3.2 oz (117.1 kg)  09/26/22 257 lb 12.8 oz (116.9 kg)  07/18/22 261 lb (118.4 kg)  06/29/22 262 lb 6.4 oz (119 kg)  06/16/22 259 lb 9.6 oz (117.8 kg)  06/15/22 261 lb 6.4 oz (118.6 kg)  06/08/22 260 lb (117.9 kg)  03/15/22 260 lb (117.9 kg)   Constitutional: obese, in NAD, in motorized wheelchair Eyes: EOMI, no exophthalmos ENT: no thyromegaly, no cervical lymphadenopathy Cardiovascular: RRR, No MRG, + bilateral LE pitting  edema Respiratory: CTA B Musculoskeletal: no deformities Skin: bilateral periankle/shin erythema Neurological: +tremor with outstretched hands Diabetic Foot Exam - Simple   Simple Foot Form Diabetic Foot exam was performed with the following findings: Yes 01/18/2023 12:02 PM  Visual Inspection No deformities, no ulcerations, no other skin breakdown bilaterally: Yes Sensation Testing See comments: Yes Pulse Check Posterior Tibialis and Dorsalis pulse intact bilaterally: Yes Comments Decreased sensation to monofilament R>L Ichtyosis B feet B pitting edema    ASSESSMENT: 1. DM2, insulin-dependent, uncontrolled, with complications - mild NP DR OD  2. HL  3.  Obesity class III  PLAN:  1. Patient with longstanding, uncontrolled, type 2 diabetes, on metformin and basal-bolus insulin regimen with NPH and NovoLog insulin, with fairly good control.  At last visit, HbA1c was higher, at 6.6%, increased from 6.1%.  Sugars were mostly fluctuating within the target range with higher blood sugars after approximately 11 PM.  We discussed about paying attention to the snacks at night, ideally eliminating them, but otherwise I did not suggest a change in regimen.  I sent a prescription for NovoLog vials, which she preferred, to her pharmacy.  We continued her metformin. CGM interpretation: -At today's visit, we reviewed her CGM downloads the last 2 weeks and also for the last 3 months.  The last 2 weeks, it appears that 84% of values are in target range (goal >70%), while 14% are higher than 180 (goal <25%), and 2% are lower than 70 (goal <4%).  The calculated average blood sugar is 137.  The projected HbA1c for the next 3 months (GMI) is 6.6%. -Reviewing the CGM trends, sugars appear to be mostly fluctuating within the target range with only 1 significantly hyperglycemic peaks after lunch yesterday, as she forgot to take her insulin before this meal.  Sugars are occasionally higher in the second half of  the night, but this is not a consistent trend and I would be reticent to increase her NPH at bedtime due to fear of nocturnal hypoglycemia.  In fact, at today's visit I recommended to continue the same regimen.  - I suggested to:  Patient Instructions  Please continue: - Metformin 500 mg in am and 1000 mg with dinner  Please use the following regimen: Insulin Before breakfast Before lunch Before dinner Bedtime  Fiasp 18-20 10-15 15-20 -  NPH 20 15 - 35   Please stop at the lab.  Please come back for a follow-up appointment in 4-6 months.  - we checked her HbA1c: 6.4% (lower)  - advised to check sugars at different times of the day - 4x a day, rotating check times - advised for yearly eye exams >> she is UTD - will check an ACR today - return to clinic in 4-6 months  2. HL -Reviewed latest lipid panel from 2 years ago, fractions at goal except for slightly low HDL: Lab Results  Component Value Date   CHOL 109 02/02/2021   HDL 37.80 (L) 02/02/2021   LDLCALC 41 02/02/2021   LDLDIRECT 78.0 10/05/2018   TRIG 150.0 (H) 02/02/2021   CHOLHDL 3 02/02/2021  -He is on pravastatin 40 mg daily without side effects -She is due for another lipid panel and her PCP put orders in -she will go to the lab and have this checked  3.  Obesity class III -Fortunately, we could not continue Ozempic as this was not covered by her insurance -She gained 5 pounds before last visit, previously 6 -She lost 12 pounds since last visit  Office Visit on 01/18/2023  Component Date Value Ref Range Status   Microalb, Ur 01/18/2023 16.5 (H)  0.0 - 1.9 mg/dL Final   Creatinine,U 40/98/1191 45.5  mg/dL Final   Microalb Creat Ratio 01/18/2023 36.2 (H)  0.0 - 30.0 mg/g Final   Hemoglobin A1C 01/18/2023 6.4 (A)  4.0 - 5.6 % Final   Msg sent: Dear Ms. Dinius, Your urinary proteins are slightly elevated.  Please make sure you are taking the lisinopril, which can help with this.  Will plan to repeat the test at  next visit. Sincerely, Carlus Pavlov MD  Carlus Pavlov, MD PhD [facility]

## 2023-01-19 ENCOUNTER — Ambulatory Visit
Admission: RE | Admit: 2023-01-19 | Discharge: 2023-01-19 | Disposition: A | Discharge status: Home / Self Care | Payer: Medicare Other | Source: Ambulatory Visit | Attending: Radiation Oncology | Admitting: Radiation Oncology

## 2023-01-19 ENCOUNTER — Encounter: Payer: Self-pay | Admitting: Radiation Oncology

## 2023-01-19 VITALS — BP 134/60 | HR 97 | Temp 97.8°F | Resp 18 | Ht 61.0 in | Wt 249.0 lb

## 2023-01-19 DIAGNOSIS — Z791 Long term (current) use of non-steroidal anti-inflammatories (NSAID): Secondary | ICD-10-CM | POA: Diagnosis not present

## 2023-01-19 DIAGNOSIS — Z79890 Hormone replacement therapy (postmenopausal): Secondary | ICD-10-CM | POA: Insufficient documentation

## 2023-01-19 DIAGNOSIS — Z7951 Long term (current) use of inhaled steroids: Secondary | ICD-10-CM | POA: Diagnosis not present

## 2023-01-19 DIAGNOSIS — C541 Malignant neoplasm of endometrium: Secondary | ICD-10-CM | POA: Diagnosis not present

## 2023-01-19 DIAGNOSIS — Z923 Personal history of irradiation: Secondary | ICD-10-CM | POA: Diagnosis not present

## 2023-01-19 DIAGNOSIS — Z79899 Other long term (current) drug therapy: Secondary | ICD-10-CM | POA: Diagnosis not present

## 2023-01-19 DIAGNOSIS — Z8542 Personal history of malignant neoplasm of other parts of uterus: Secondary | ICD-10-CM | POA: Insufficient documentation

## 2023-01-19 DIAGNOSIS — Z7984 Long term (current) use of oral hypoglycemic drugs: Secondary | ICD-10-CM | POA: Insufficient documentation

## 2023-01-19 MED ORDER — FLUCONAZOLE 150 MG PO TABS: 150.0000 mg | ORAL_TABLET | Freq: Every day | ORAL | 0 refills | Status: DC

## 2023-01-19 NOTE — Progress Notes (Signed)
[name] is here today for follow up post radiation to the pelvic.  They completed their radiation on: 05/21/2018  Does the patient complain of any of the following:  Pain: Yes, sciatica in her hips but not related to radiation. Abdominal bloating: No Diarrhea/Constipation: Yes, intermittently Nausea/Vomiting: No Vaginal Discharge: No Blood in Urine or Stool: No Urinary Issues (dysuria/incomplete emptying/ incontinence/ increased frequency/urgency): She reports some incontinence. Does patient report using vaginal dilator 2-3 times a week and/or sexually active 2-3 weeks: Patient reports no using dilators. Post radiation skin changes: No   BP 134/60 (BP Location: Right Arm, Patient Position: Sitting)   Pulse 97   Temp 97.8 F (36.6 C) (Temporal)   Resp 18   Ht 5\' 1"  (1.549 m)   Wt 249 lb (112.9 kg)   LMP 08/10/2010 Comment: Hysterectomy 02/02/18  SpO2 94%   BMI 47.05 kg/m

## 2023-03-07 ENCOUNTER — Other Ambulatory Visit: Payer: Self-pay | Admitting: Family Medicine

## 2023-03-31 DIAGNOSIS — R0602 Shortness of breath: Secondary | ICD-10-CM | POA: Diagnosis not present

## 2023-03-31 DIAGNOSIS — I959 Hypotension, unspecified: Secondary | ICD-10-CM | POA: Diagnosis not present

## 2023-03-31 DIAGNOSIS — I700 Atherosclerosis of aorta: Secondary | ICD-10-CM | POA: Diagnosis not present

## 2023-03-31 DIAGNOSIS — I6523 Occlusion and stenosis of bilateral carotid arteries: Secondary | ICD-10-CM | POA: Diagnosis not present

## 2023-03-31 DIAGNOSIS — I6782 Cerebral ischemia: Secondary | ICD-10-CM | POA: Diagnosis not present

## 2023-03-31 DIAGNOSIS — Z043 Encounter for examination and observation following other accident: Secondary | ICD-10-CM | POA: Diagnosis not present

## 2023-03-31 DIAGNOSIS — R5383 Other fatigue: Secondary | ICD-10-CM | POA: Diagnosis not present

## 2023-03-31 DIAGNOSIS — R918 Other nonspecific abnormal finding of lung field: Secondary | ICD-10-CM | POA: Diagnosis not present

## 2023-03-31 DIAGNOSIS — W19XXXA Unspecified fall, initial encounter: Secondary | ICD-10-CM | POA: Diagnosis not present

## 2023-03-31 DIAGNOSIS — J101 Influenza due to other identified influenza virus with other respiratory manifestations: Secondary | ICD-10-CM | POA: Diagnosis not present

## 2023-03-31 DIAGNOSIS — J1000 Influenza due to other identified influenza virus with unspecified type of pneumonia: Secondary | ICD-10-CM | POA: Diagnosis not present

## 2023-03-31 DIAGNOSIS — Z20822 Contact with and (suspected) exposure to covid-19: Secondary | ICD-10-CM | POA: Diagnosis not present

## 2023-03-31 DIAGNOSIS — I517 Cardiomegaly: Secondary | ICD-10-CM | POA: Diagnosis not present

## 2023-03-31 DIAGNOSIS — S0990XA Unspecified injury of head, initial encounter: Secondary | ICD-10-CM | POA: Diagnosis not present

## 2023-04-06 ENCOUNTER — Other Ambulatory Visit: Payer: Self-pay | Admitting: Internal Medicine

## 2023-04-06 NOTE — Telephone Encounter (Signed)
NPH/NovolinN refill request complete

## 2023-04-10 ENCOUNTER — Other Ambulatory Visit: Payer: Self-pay | Admitting: Family Medicine

## 2023-04-11 MED ORDER — HYDROCODONE-ACETAMINOPHEN 10-325 MG PO TABS: ORAL_TABLET | ORAL | 0 refills | Status: DC

## 2023-04-24 ENCOUNTER — Inpatient Hospital Stay: Payer: Medicare Other | Admitting: Family Medicine

## 2023-04-24 NOTE — Progress Notes (Deleted)
 04/24/2023  CC: No chief complaint on file.   [name] who presents for emergency department follow-up.  I have reviewed patient's discharge summary plus pertinent specific notes, labs, and imaging from the hospitalization.   She presented to [facility] emergency department on 03/31/2023.  Was helping situate husband in the car and the door accidentally closed and hit her in the head.  She fell forward in the car.  No loss of consciousness or bleeding.  She also had upper and lower respiratory symptoms in the previous 5 to 6 days.  CT head showed no acute abnormalities.  Flu swab positive influenza A.  She was outside the window for Tamiflu.  Chest x-ray showed mild cardiomegaly and basilar interstitial prominence which could reflect pulmonary vascular congestion or possibly atypical pneumonia.  Her O2 sats did drop into the 80s to low 90s at rest and down to 82% with ambulation on room air.  Due to hypoxia it was recommended she be admitted but patient refused.  She was prescribed Levaquin  x 5 days.  CURRENTLY: {current status of patient, symptoms, etc}    PMH:  Past Medical History:  Diagnosis Date   Allergic rhinitis    Allergy testing: results pending as of 05/28/16 (Dr. Fleeta Smock)   Anemia 10/2020   anemia of chronic dz (? + mild IDA?--iron  started   Arthritis    Chemotherapy-induced neuropathy (HCC) 2019   Cough variant asthma    DDD (degenerative disc disease), lumbar    Diabetes mellitus with complication (HCC)    Mild nonprolif DR R eye 12/2017-->referred to retinal specialist DM MANAGED BY DR. GHERGHE AS OF 2021   Endometrial cancer (HCC) 02/2018   REMISSION AS OF 08/2018.  Stage III (T3, Nx, Mx)  Path: endometroid adenocarcinoma involving cervix, uterus, and fallopian tubes (Pt got TAH & BSO 02/2018). No sign of recurrence as of onc f/u 05/2019.   GERD (gastroesophageal reflux disease)    Hemorrhoids    Herpes zoster 10/2015   L side belt-line    History of adenomatous polyp of colon 02/25/2013   5 mm cecal polyp removed by Dr. Alfrieda 5 yrs   History of blood transfusion    History of cellulitis 08/2010   Left (Since replacemnt of Left knee   History of radiation therapy 05/21/2018   [facility] brachytherapy 04/24/2018-05/21/2018  Dr Lynwood Nasuti   Hyperkalemia 03/2018   started after pt started getting chemo-->had to stop enalapril .   Hyperlipidemia    Not on statin b/c lipids stayed down when sugars came down per pt.  She says Dr. Tommas knows she is not on statin anymore.   Hypertension    Hypothyroidism    Nephrolithiasis 07/2005   Obesity    OSA on CPAP    Osteoarthritis    knees, ankle, + ? scapholunate ligament disruption (x-ray 06/2017).  Progressive arthritis L wrist and hand 09/2022 radiographs   Pancytopenia due to antineoplastic chemotherapy [facility])    progressive as of 06/2018. Stable 08/2018.   Recurrent UTI    started cipro  250 qd 09/2018   Sciatica of left side    Severe persistent asthma    cough-variant--saw Allergist 05/25/16 and was switched from max dose advair to symbicort .   Systolic murmur    ECHO fine 10/2016-->murmur likely flow murmur assoc with HTN.   Zoon's vulvitis    Bx-proven (GYN) -lichen sclerosis.  Clobetasol  0.05% ointment per GYN    PSH:  Past Surgical History:  Procedure Laterality Date   ANKLE FRACTURE SURGERY  1984   Pin & repair   ARTHROSCOPIC REPAIR ACL  2000   Due to ACL tear   ARTHROSCOPIC REPAIR ACL  5/08   Blateral knee rerlacements x4 2 times each knee     BREAST BIOPSY Right 2014   Benign   CARDIAC CATHETERIZATION  5/07   clear vessel mild mitral    CARPAL TUNNEL RELEASE Right [id number]   1989 Left   CESAREAN SECTION  1985   CHOLECYSTECTOMY OPEN  1988   COLONOSCOPY N/A 02/25/2013   Tubular adenoma x 1: Recall 5 yrs. Procedure: COLONOSCOPY;  Surgeon: Renaye Sous, MD;  Location: [facility];  Service: Endoscopy;  Laterality: N/A;   COMBINED HYSTEROSCOPY DIAGNOSTIC / D&C   2/12   Bx neg   DEXA  08/2007; 05/12/20   Bone density normal 2009 and 2022.  Plan rpt 2024.   DILATION AND CURETTAGE OF UTERUS  12/11   IR IMAGING GUIDED PORT INSERTION  03/16/2018   IR REMOVAL TUN ACCESS W/ PORT W/O FL MOD SED  09/20/2018   LYMPH NODE BIOPSY N/A 02/19/2018   Procedure: Sentinel LYMPH NODE BIOPSY;  Surgeon: Eloy Herring, MD;  Location: [facility];  Service: Gynecology;  Laterality: N/A;   REPLACEMENT TOTAL KNEE Left 5/12   X 2 on each   ROBOTIC ASSISTED TOTAL HYSTERECTOMY WITH BILATERAL SALPINGO OOPHERECTOMY N/A 02/19/2018   For endometrial cancer.  Procedure: XI ROBOTIC ASSISTED TOTAL HYSTERECTOMY WITH BILATERAL SALPINGO OOPHORECTOMY;  Surgeon: Eloy Herring, MD;  Location: [facility] ;  Service: Gynecology;  Laterality: N/A;   TRANSTHORACIC ECHOCARDIOGRAM  10/11/2016    EF 55-60%, grd I DD.   TUBAL LIGATION  1985   C-Section    MEDS:  Outpatient Medications Prior to Visit  Medication Sig Dispense Refill   albuterol  (PROVENTIL ) (2.5 MG/3ML) 0.083% nebulizer solution Take 3 mLs (2.5 mg total) by nebulization every 4 (four) hours as needed for wheezing or shortness of breath (Dx: J45.50). 75 mL 1   albuterol  (VENTOLIN  HFA) 108 (90 Base) MCG/ACT inhaler INHALE 2 PUFFS INTO LUNGS EVERY 6 HOURS AS NEEDED FOR WHEEZING/SHORTNESS OF BREATH 18 g 1   amitriptyline  (ELAVIL ) 25 MG tablet Take 2 tablets (50 mg total) by mouth at bedtime. 180 tablet 1   Apoaequorin (PREVAGEN PO) Take by mouth daily. (Patient not taking: Reported on 01/19/2023)     clobetasol  ointment (TEMOVATE ) 0.05 % APPLY 1 APPLICATION TOPICALLY AS NEEDED. APPLY TO THE SKIN OF THE VULVA FOR ITCHING 30 g 2   diclofenac  Sodium (VOLTAREN ) 1 % GEL Apply 2 g topically 4 (four) times daily. 100 g 3   felodipine  (PLENDIL ) 10 MG 24 hr tablet Take 1 tablet (10 mg total) by mouth daily. 90 tablet 0   Ferrous Sulfate (IRON  PO) Take by mouth. (Patient not taking: Reported on 01/19/2023)     FIASP   FLEXTOUCH 100 UNIT/ML FlexTouch Pen SMARTSIG:20-25 Unit(s) SUB-Q 3 Times Daily     fluconazole  (DIFLUCAN ) 150 MG tablet Take 1 tablet (150 mg total) by mouth daily. Take 2nd tablet 72 hours later 2 tablet 0   fluticasone  (CUTIVATE ) 0.05 % cream APPLY TO AFFECTED AREA OF RIGHT LOWER LEG TWICE PER DAY. 60 g 1   fluticasone  (VERAMYST) 27.5 MCG/SPRAY nasal spray Place 1 spray into the nose daily as needed for rhinitis.     furosemide  (LASIX ) 20 MG tablet Take 1 tablet (20 mg total) by mouth daily. 90 tablet 1   HYDROcodone -acetaminophen  (  NORCO) 10-325 MG tablet 1/2 - 1 tab po bid prn pain 45 tablet 0   Insulin  Pen Needle 32G X 4 MM MISC Use 3x a day 300 each 3   Insulin  Syringes, Disposable, U-100 0.3 ML MISC Use to inject insulin --6 injections per day 540 each 3   levocetirizine (XYZAL) 5 MG tablet Take 5 mg by mouth at bedtime as needed for allergies.   5   levothyroxine  (SYNTHROID ) 125 MCG tablet Take 1 tablet (125 mcg total) by mouth daily. 90 tablet 1   lisinopril  (ZESTRIL ) 10 MG tablet Take 1 tablet (10 mg total) by mouth daily. 90 tablet 0   metFORMIN  (GLUCOPHAGE ) 500 MG tablet TAKE 1 TABLET BY MOUTH EVERY MORNING AND 2 TABLETS BY MOUTH AT BEDTIME. Strength: 500 mg 270 tablet 3   miconazole (MICOTIN) 2 % powder Apply 1 application topically 2 (two) times daily as needed for itching.     Mometasone  Furo-Formoterol  Fum (DULERA ) 50-5 MCG/ACT AERO 2 puffs bid 3 each 1   mupirocin  ointment (BACTROBAN ) 2 % Apply 1 Application topically 3 (three) times daily. Apply to affected areas tid x 10d 15 g 0   NOVOLIN  N 100 UNIT/ML injection INJECT 30 UNITS TOTAL (0.3 ML) INTO THE SKIN 3 (THREE) TIMES DAILY BEFORE MEALS. 90 mL 1   pravastatin  (PRAVACHOL ) 40 MG tablet Take 1 tablet (40 mg total) by mouth daily. 90 tablet 1   Probiotic Product (PROBIOTIC PO) Take 1 capsule by mouth daily.     VITAMIN D PO Take by mouth.     No facility-administered medications prior to visit.    Physical Exam    01/19/2023    11:24 AM 01/18/2023   11:47 AM 12/20/2022   10:07 AM  Vitals with BMI  Height 5' 1 5' 1   Weight 249 lbs 249 lbs   BMI 47.07 47.07   Systolic 134 130 857  Diastolic 60 70 59  Pulse 97 92    ***   Pertinent labs/imaging Last CBC Lab Results  Component Value Date   WBC 8.9 02/02/2021   HGB 11.5 (L) 02/02/2021   HCT 34.9 (L) 02/02/2021   MCV 94.4 02/02/2021   MCH 34.6 (H) 09/20/2018   RDW 15.7 (H) 02/02/2021   PLT 224.0 02/02/2021   Last metabolic panel Lab Results  Component Value Date   GLUCOSE 95 12/31/2021   NA 140 12/31/2021   K 5.0 12/31/2021   CL 103 12/31/2021   CO2 28 12/31/2021   BUN 46 (H) 12/31/2021   CREATININE 0.99 12/31/2021   GFR 57.57 (L) 12/31/2021   CALCIUM 9.4 12/31/2021   PROT 7.1 09/02/2021   ALBUMIN 3.8 09/02/2021   BILITOT 0.2 09/02/2021   ALKPHOS 58 09/02/2021   AST 18 09/02/2021   ALT 16 09/02/2021   ANIONGAP 10 06/16/2020   ASSESSMENT/PLAN:  ***  Fall + influenza A + suspected atypical pneumonia + hypoxia.  FOLLOW UP:  ***  Signed:  Gerlene Hockey, MD           04/24/2023

## 2023-04-28 ENCOUNTER — Encounter: Payer: Self-pay | Admitting: Family Medicine

## 2023-04-28 ENCOUNTER — Ambulatory Visit (HOSPITAL_BASED_OUTPATIENT_CLINIC_OR_DEPARTMENT_OTHER)
Admission: RE | Admit: 2023-04-28 | Discharge: 2023-04-28 | Disposition: A | Discharge status: Home / Self Care | Payer: Medicare Other | Source: Ambulatory Visit | Attending: Family Medicine | Admitting: Family Medicine

## 2023-04-28 ENCOUNTER — Ambulatory Visit (INDEPENDENT_AMBULATORY_CARE_PROVIDER_SITE_OTHER): Payer: Medicare Other | Admitting: Family Medicine

## 2023-04-28 VITALS — BP 135/71 | HR 90 | Wt 247.6 lb

## 2023-04-28 DIAGNOSIS — W19XXXD Unspecified fall, subsequent encounter: Secondary | ICD-10-CM

## 2023-04-28 DIAGNOSIS — W19XXXA Unspecified fall, initial encounter: Secondary | ICD-10-CM | POA: Insufficient documentation

## 2023-04-28 DIAGNOSIS — S63502A Unspecified sprain of left wrist, initial encounter: Secondary | ICD-10-CM

## 2023-04-28 DIAGNOSIS — M25532 Pain in left wrist: Secondary | ICD-10-CM | POA: Insufficient documentation

## 2023-04-28 DIAGNOSIS — E7800 Pure hypercholesterolemia, unspecified: Secondary | ICD-10-CM | POA: Diagnosis not present

## 2023-04-28 DIAGNOSIS — I10 Essential (primary) hypertension: Secondary | ICD-10-CM | POA: Diagnosis not present

## 2023-04-28 DIAGNOSIS — E039 Hypothyroidism, unspecified: Secondary | ICD-10-CM

## 2023-04-28 DIAGNOSIS — J4541 Moderate persistent asthma with (acute) exacerbation: Secondary | ICD-10-CM | POA: Diagnosis not present

## 2023-04-28 DIAGNOSIS — M79632 Pain in left forearm: Secondary | ICD-10-CM | POA: Insufficient documentation

## 2023-04-28 DIAGNOSIS — R600 Localized edema: Secondary | ICD-10-CM | POA: Diagnosis not present

## 2023-04-28 DIAGNOSIS — M129 Arthropathy, unspecified: Secondary | ICD-10-CM | POA: Diagnosis not present

## 2023-04-28 LAB — LIPID PANEL
Cholesterol: 110 mg/dL (ref 0–200)
HDL: 28.7 mg/dL — ABNORMAL LOW (ref >39.00)
LDL Cholesterol: 45 mg/dL (ref 0–99)
NonHDL: 81.53
Total CHOL/HDL Ratio: 4
Triglycerides: 185 mg/dL — ABNORMAL HIGH (ref 0.0–149.0)
VLDL: 37 mg/dL (ref 0.0–40.0)

## 2023-04-28 LAB — COMPREHENSIVE METABOLIC PANEL
ALT: 8 U/L (ref 0–35)
AST: 11 U/L (ref 0–37)
Albumin: 3.7 g/dL (ref 3.5–5.2)
Alkaline Phosphatase: 58 U/L (ref 39–117)
BUN: 34 mg/dL — ABNORMAL HIGH (ref 6–23)
CO2: 25 meq/L (ref 19–32)
Calcium: 8.9 mg/dL (ref 8.4–10.5)
Chloride: 107 meq/L (ref 96–112)
Creatinine, Ser: 1.17 mg/dL (ref 0.40–1.20)
GFR: 46.68 mL/min — ABNORMAL LOW (ref >60.00)
Glucose, Bld: 38 mg/dL — CL (ref 70–99)
Potassium: 4.4 meq/L (ref 3.5–5.1)
Sodium: 140 meq/L (ref 135–145)
Total Bilirubin: 0.2 mg/dL (ref 0.2–1.2)
Total Protein: 7.1 g/dL (ref 6.0–8.3)

## 2023-04-28 LAB — CBC WITH DIFFERENTIAL/PLATELET
Basophils Absolute: 0 10*3/uL (ref 0.0–0.1)
Basophils Relative: 0.6 % (ref 0.0–3.0)
Eosinophils Absolute: 0.4 10*3/uL (ref 0.0–0.7)
Eosinophils Relative: 5.7 % — ABNORMAL HIGH (ref 0.0–5.0)
HCT: 23.5 % — CL (ref 36.0–46.0)
Hemoglobin: 7.5 g/dL — CL (ref 12.0–15.0)
Lymphocytes Relative: 15.2 % (ref 12.0–46.0)
Lymphs Abs: 1 10*3/uL (ref 0.7–4.0)
MCHC: 32 g/dL (ref 30.0–36.0)
MCV: 95.3 fL (ref 78.0–100.0)
Monocytes Absolute: 0.7 10*3/uL (ref 0.1–1.0)
Monocytes Relative: 10.5 % (ref 3.0–12.0)
Neutro Abs: 4.5 10*3/uL (ref 1.4–7.7)
Neutrophils Relative %: 68 % (ref 43.0–77.0)
Platelets: 294 10*3/uL (ref 150.0–400.0)
RBC: 2.46 Mil/uL — ABNORMAL LOW (ref 3.87–5.11)
RDW: 17.7 % — ABNORMAL HIGH (ref 11.5–15.5)
WBC: 6.6 10*3/uL (ref 4.0–10.5)

## 2023-04-28 LAB — TSH: TSH: 4.18 u[IU]/mL (ref 0.35–5.50)

## 2023-04-28 MED ORDER — PREDNISONE 20 MG PO TABS: ORAL_TABLET | ORAL | 0 refills | Status: DC

## 2023-04-28 NOTE — Progress Notes (Signed)
OFFICE VISIT  04/28/2023  CC:  Chief Complaint  Patient presents with   Hospitalization Follow-up    ED F/U. While pt was in ED due to fall, pt was dx with pneumonia. Pt was prescribed with Levofloxacin.      [name] who presents for follow-up emergency department visit 03/30/2024 (28 days ago) for a fall.  [facility] on [facility] in [place]). I reviewed the ED record in its entirety today. She accidentally fell when helping her husband into her car. Hit head, no loss of consciousness.  She had been having a cold and cough at the time, was found to be hypoxic in the emergency department.  Influenza A test positive. Chest x-ray showed mild cardiomegaly and basilar interstitial prominence which could reflect pulmonary vascular congestion or possibly atypical pneumonia.  Admission to the hospital was recommended but she declined.  She was discharged home to take 5 days of Levaquin.  INTERIM HX: Still has some rattly cough.  Comes in fits and sometimes she feels like she cannot catch her breath during the fits.  In between coughing fits she has just a mild amount of increased dyspnea on exertion over her baseline. No wheezing or fever.  Appetite and energy level have returned to her baseline.  Since the time of her fall she has had increased left wrist pain.  It extends up into the proximal forearm mostly on the radial aspect.  All wrist motions make it worse.  She initially had some swelling and bruising around the radial aspect of the wrist. She wears a wrist splint that is not a spica splint.  Her baseline low back and osteoarthritic joint pain is stable.  She usually uses Vicodin at night. Indication for chronic opioid: osteoarthritis in hands, low back, feet, L hip, knees, wrists+chemotherapy -induced neuropathy (burning and numbness in feet). Takes 1-2 hydrocodone tabs a day typically.  Not NSAID candidate due to renal insufficiency. PMP AWARE  reviewed today: most recent rx for Vicodin 10/325 was filled 04/11/2023, # 45, rx by me. No red flags.  ROS as above, plus--> no CP,  no dizziness, no HAs, no rashes, no melena/hematochezia.  No polyuria or polydipsia.  No focal weakness, paresthesias, or tremors.  No acute vision or hearing abnormalities.  No dysuria or unusual/new urinary urgency or frequency.  No recent changes in lower legs. No n/v/d or abd pain.  No palpitations.    Past Medical History:  Diagnosis Date   Allergic rhinitis    Allergy testing: results pending as of 05/28/16 (Dr. Irena Cords)   Anemia 10/2020   anemia of chronic dz (? + mild IDA?--iron started   Arthritis    Chemotherapy-induced neuropathy (HCC) 2019   Cough variant asthma    DDD (degenerative disc disease), lumbar    Diabetes mellitus with complication (HCC)    Mild nonprolif DR R eye 12/2017-->referred to retinal specialist DM MANAGED BY DR. GHERGHE AS OF 2021   Endometrial cancer (HCC) 02/2018   REMISSION AS OF 08/2018.  Stage III (T3, Nx, Mx)  Path: endometroid adenocarcinoma involving cervix, uterus, and fallopian tubes (Pt got TAH & BSO 02/2018). No sign of recurrence as of onc f/u 05/2019.   GERD (gastroesophageal reflux disease)    Hemorrhoids    Herpes zoster 10/2015   L side belt-line   History of adenomatous polyp of colon 02/25/2013   5 mm cecal polyp removed by Dr. Jacquelynn Cree 5 yrs   History  of blood transfusion    History of cellulitis 08/2010   Left (Since replacemnt of Left knee   History of radiation therapy 05/21/2018   [facility] brachytherapy 04/24/2018-05/21/2018  Dr Antony Blackbird   Hyperkalemia 03/2018   started after pt started getting chemo-->had to stop enalapril.   Hyperlipidemia    Not on statin b/c lipids "stayed down when sugars came down" per pt.  She says Dr. Talmage Nap knows she is not on statin anymore.   Hypertension    Hypothyroidism    Nephrolithiasis 07/2005   Obesity    OSA on CPAP    Osteoarthritis    knees, ankle,  + ? scapholunate ligament disruption (x-ray 06/2017).  Progressive arthritis L wrist and hand 09/2022 radiographs   Pancytopenia due to antineoplastic chemotherapy [facility])    progressive as of 06/2018. Stable 08/2018.   Recurrent UTI    started cipro 250 qd 09/2018   Sciatica of left side    Severe persistent asthma    cough-variant--saw Allergist 05/25/16 and was switched from max dose advair to symbicort.   Systolic murmur    ECHO fine 10/2016-->murmur likely flow murmur assoc with HTN.   Zoon's vulvitis    Bx-proven (GYN) -lichen sclerosis.  Clobetasol 0.05% ointment per GYN    Past Surgical History:  Procedure Laterality Date   ANKLE FRACTURE SURGERY  1984   Pin & repair   ARTHROSCOPIC REPAIR ACL  2000   Due to ACL tear   ARTHROSCOPIC REPAIR ACL  5/08   Blateral knee rerlacements x4 2 times each knee     BREAST BIOPSY Right 2014   Benign   CARDIAC CATHETERIZATION  5/07   clear vessel mild mitral    CARPAL TUNNEL RELEASE Right [id number]   1989 Left   CESAREAN SECTION  1985   CHOLECYSTECTOMY OPEN  1988   COLONOSCOPY N/A 02/25/2013   Tubular adenoma x 1: Recall 5 yrs. Procedure: COLONOSCOPY;  Surgeon: Charna Elizabeth, MD;  Location: [facility];  Service: Endoscopy;  Laterality: N/A;   COMBINED HYSTEROSCOPY DIAGNOSTIC / D&C  2/12   Bx neg   DEXA  08/2007; 05/12/20   Bone density normal 2009 and 2022.  Plan rpt 2024.   DILATION AND CURETTAGE OF UTERUS  12/11   IR IMAGING GUIDED PORT INSERTION  03/16/2018   IR REMOVAL TUN ACCESS W/ PORT W/O FL MOD SED  09/20/2018   LYMPH NODE BIOPSY N/A 02/19/2018   Procedure: Sentinel LYMPH NODE BIOPSY;  Surgeon: Adolphus Birchwood, MD;  Location: [facility];  Service: Gynecology;  Laterality: N/A;   REPLACEMENT TOTAL KNEE Left 5/12   X 2 on each   ROBOTIC ASSISTED TOTAL HYSTERECTOMY WITH BILATERAL SALPINGO OOPHERECTOMY N/A 02/19/2018   For endometrial cancer.  Procedure: XI ROBOTIC ASSISTED TOTAL HYSTERECTOMY WITH BILATERAL SALPINGO  OOPHORECTOMY;  Surgeon: Adolphus Birchwood, MD;  Location: [facility];  Service: Gynecology;  Laterality: N/A;   TRANSTHORACIC ECHOCARDIOGRAM  10/11/2016    EF 55-60%, grd I DD.   TUBAL LIGATION  1985   C-Section    Outpatient Medications Prior to Visit  Medication Sig Dispense Refill   albuterol (PROVENTIL) (2.5 MG/3ML) 0.083% nebulizer solution Take 3 mLs (2.5 mg total) by nebulization every 4 (four) hours as needed for wheezing or shortness of breath (Dx: J45.50). 75 mL 1   albuterol (VENTOLIN HFA) 108 (90 Base) MCG/ACT inhaler INHALE 2 PUFFS INTO LUNGS EVERY 6 HOURS AS NEEDED FOR WHEEZING/SHORTNESS OF BREATH 18 g 1   amitriptyline (  ELAVIL) 25 MG tablet Take 2 tablets (50 mg total) by mouth at bedtime. 180 tablet 1   clobetasol ointment (TEMOVATE) 0.05 % APPLY 1 APPLICATION TOPICALLY AS NEEDED. APPLY TO THE SKIN OF THE VULVA FOR ITCHING 30 g 2   diclofenac Sodium (VOLTAREN) 1 % GEL Apply 2 g topically 4 (four) times daily. 100 g 3   felodipine (PLENDIL) 10 MG 24 hr tablet Take 1 tablet (10 mg total) by mouth daily. 90 tablet 0   FIASP FLEXTOUCH 100 UNIT/ML FlexTouch Pen SMARTSIG:20-25 Unit(s) SUB-Q 3 Times Daily     fluconazole (DIFLUCAN) 150 MG tablet Take 1 tablet (150 mg total) by mouth daily. Take 2nd tablet 72 hours later 2 tablet 0   fluticasone (CUTIVATE) 0.05 % cream APPLY TO AFFECTED AREA OF RIGHT LOWER LEG TWICE PER DAY. 60 g 1   fluticasone (VERAMYST) 27.5 MCG/SPRAY nasal spray Place 1 spray into the nose daily as needed for rhinitis.     furosemide (LASIX) 20 MG tablet Take 1 tablet (20 mg total) by mouth daily. 90 tablet 1   HYDROcodone-acetaminophen (NORCO) 10-325 MG tablet 1/2 - 1 tab po bid prn pain 45 tablet 0   Insulin Pen Needle 32G X 4 MM MISC Use 3x a day 300 each 3   Insulin Syringes, Disposable, U-100 0.3 ML MISC Use to inject insulin--6 injections per day 540 each 3   levocetirizine (XYZAL) 5 MG tablet Take 5 mg by mouth at bedtime as needed for allergies.    5   levothyroxine (SYNTHROID) 125 MCG tablet Take 1 tablet (125 mcg total) by mouth daily. 90 tablet 1   lisinopril (ZESTRIL) 10 MG tablet Take 1 tablet (10 mg total) by mouth daily. 90 tablet 0   metFORMIN (GLUCOPHAGE) 500 MG tablet TAKE 1 TABLET BY MOUTH EVERY MORNING AND 2 TABLETS BY MOUTH AT BEDTIME. Strength: 500 mg 270 tablet 3   miconazole (MICOTIN) 2 % powder Apply 1 application topically 2 (two) times daily as needed for itching.     Mometasone Furo-Formoterol Fum (DULERA) 50-5 MCG/ACT AERO 2 puffs bid 3 each 1   mupirocin ointment (BACTROBAN) 2 % Apply 1 Application topically 3 (three) times daily. Apply to affected areas tid x 10d 15 g 0   NOVOLIN N 100 UNIT/ML injection INJECT 30 UNITS TOTAL (0.3 ML) INTO THE SKIN 3 (THREE) TIMES DAILY BEFORE MEALS. 90 mL 1   pravastatin (PRAVACHOL) 40 MG tablet Take 1 tablet (40 mg total) by mouth daily. 90 tablet 1   Probiotic Product (PROBIOTIC PO) Take 1 capsule by mouth daily.     VITAMIN D PO Take by mouth.     Apoaequorin (PREVAGEN PO) Take by mouth daily. (Patient not taking: Reported on 04/28/2023)     Ferrous Sulfate (IRON PO) Take by mouth. (Patient not taking: Reported on 04/28/2023)     No facility-administered medications prior to visit.    Allergies  Allergen Reactions   Adhesive [Tape] Other (See Comments)    Takes patient's skin off.   Banana Diarrhea and Nausea Only   Egg-Derived Products Diarrhea and Nausea Only   Latex Other (See Comments)    sensativity   Linzess [Linaclotide] Diarrhea   Ozempic (0.25 Or 0.5 Mg-Dose) [Semaglutide(0.25 Or 0.5mg -Dos)] Diarrhea   Penicillins Rash and Other (See Comments)    Has had cephalosporins without incident   Pine Swelling and Rash   Rose Swelling and Rash    Review of Systems As per HPI  PE:  04/28/2023   11:22 AM 01/19/2023   11:24 AM 01/18/2023   11:47 AM  Vitals with BMI  Height  5\' 1"  5\' 1"   Weight 247 lbs 10 oz 249 lbs 249 lbs  BMI  47.07 47.07  Systolic 135 134  130  Diastolic 71 60 70  Pulse 90 97 92  02 sat 95% today on RA  Physical Exam  Gen: Alert, well appearing.  Patient is oriented to person, place, time, and situation. LUNGS: CTA bilat, nonlabored resps.  Good aeration.  Expiratory phase is not particularly prolonged. Cardiovascular: Regular rhythm and rate with 3 out of 6 systolic murmur.  No diastolic murmur. Left arm: She has some tenderness from the radial neck on down the forearm over the wrist and proximal thumb.  Elbow range of motion fully intact.  Elbow without swelling. She has some bruising in various stages along the forearm and wrist but no palpable hematoma. Wrist flexion to 15 degrees, extension to 10 to 15 degrees, inversion significantly limited and painful.  Eversion fully intact and without significant increase in her pain. Finkelstein's equivocal.  Very tender to palpation over radial aspect of wrist joint up into the carpometacarpal region.  LABS:  Last CBC Lab Results  Component Value Date   WBC 8.9 02/02/2021   HGB 11.5 (L) 02/02/2021   HCT 34.9 (L) 02/02/2021   MCV 94.4 02/02/2021   MCH 34.6 (H) 09/20/2018   RDW 15.7 (H) 02/02/2021   PLT 224.0 02/02/2021   Lab Results  Component Value Date   IRON 49 02/02/2021   TIBC 364 02/02/2021   FERRITIN 31 02/02/2021   Last metabolic panel Lab Results  Component Value Date   GLUCOSE 95 12/31/2021   NA 140 12/31/2021   K 5.0 12/31/2021   CL 103 12/31/2021   CO2 28 12/31/2021   BUN 46 (H) 12/31/2021   CREATININE 0.99 12/31/2021   GFR 57.57 (L) 12/31/2021   CALCIUM 9.4 12/31/2021   PROT 7.1 09/02/2021   ALBUMIN 3.8 09/02/2021   BILITOT 0.2 09/02/2021   ALKPHOS 58 09/02/2021   AST 18 09/02/2021   ALT 16 09/02/2021   ANIONGAP 10 06/16/2020   Last lipids Lab Results  Component Value Date   CHOL 109 02/02/2021   HDL 37.80 (L) 02/02/2021   LDLCALC 41 02/02/2021   LDLDIRECT 78.0 10/05/2018   TRIG 150.0 (H) 02/02/2021   CHOLHDL 3 02/02/2021   Last  hemoglobin A1c Lab Results  Component Value Date   HGBA1C 6.4 (A) 01/18/2023   Last thyroid functions Lab Results  Component Value Date   TSH 0.76 02/02/2021   IMPRESSION AND PLAN:  #1 fall: she had a small bruise on the left side of her skull but imaging was negative for fracture and she does not have any pain there anymore.  #2 influenza A, mild asthma exacerbation. She has some residual asthmatic bronchitis symptoms.  Will treat with prednisone 40 mg a day for 5 days.  3.  Left forearm and wrist pain, multifactorial.  Onset after she fell 3 weeks ago.  However, she does have a significant amount of wrist and thumb osteoarthritis at baseline. Also history of de Quervain's. I do think she sprained her wrist significantly (bedside ultrasound today did show heterogeneous echo with hyperemia within the wrist joint, suggesting either reactive synovitis or some edema secondary to ligament sprain or tear.  Extensor and flexor wrist tendons appear intact and without fluid in sheaths.  Significant degenerative arthritis changes throughout wrist and  thumb). I fitted her with a new thumb spica splint today. Will obtain forearm and wrist x-rays.  #4 hypothyroidism, overdue for TSH monitoring--.>  TSH obtained today. Continue levothyroxine 125 mcg a day.    #5 hypertension, well-controlled on felodipine 10 mg a day and lisinopril 10 mg a day. Electrolytes and creatinine today.  6.  Hypercholesterolemia, doing well on Pravachol 40 mg a day. She is not fasting today but we will do lipid panel.  7.  Chronic pain syndrome (chronic bilateral low back pain without sciatica, also osteoarthritis of multiple sites), stable.   Continue Vicodin 10/325, 1/2-1 twice daily as needed. A new prescription was not needed today.  Spent 48 min with pt today reviewing HPI, reviewing relevant past history, doing exam, reviewing and discussing lab and imaging data, and formulating plans.  An After Visit Summary  was printed and given to the patient.  FOLLOW UP: Return in about 4 weeks (around 05/26/2023) for f/u left wrist pain.  Signed:  Santiago Bumpers, MD           04/28/2023

## 2023-05-01 ENCOUNTER — Other Ambulatory Visit: Payer: Self-pay | Admitting: Family Medicine

## 2023-05-01 ENCOUNTER — Encounter: Payer: Self-pay | Admitting: Family Medicine

## 2023-05-01 DIAGNOSIS — D500 Iron deficiency anemia secondary to blood loss (chronic): Secondary | ICD-10-CM

## 2023-05-07 ENCOUNTER — Encounter: Payer: Self-pay | Admitting: Family Medicine

## 2023-05-19 ENCOUNTER — Ambulatory Visit (INDEPENDENT_AMBULATORY_CARE_PROVIDER_SITE_OTHER): Payer: Medicare Other | Admitting: Family Medicine

## 2023-05-19 ENCOUNTER — Encounter: Payer: Self-pay | Admitting: Family Medicine

## 2023-05-19 VITALS — BP 135/67 | HR 103 | Ht 61.0 in | Wt 239.4 lb

## 2023-05-19 DIAGNOSIS — M19032 Primary osteoarthritis, left wrist: Secondary | ICD-10-CM | POA: Diagnosis not present

## 2023-05-19 DIAGNOSIS — D500 Iron deficiency anemia secondary to blood loss (chronic): Secondary | ICD-10-CM

## 2023-05-19 DIAGNOSIS — M19031 Primary osteoarthritis, right wrist: Secondary | ICD-10-CM | POA: Diagnosis not present

## 2023-05-19 DIAGNOSIS — I10 Essential (primary) hypertension: Secondary | ICD-10-CM

## 2023-05-19 LAB — CBC
HCT: 29.5 % — ABNORMAL LOW (ref 36.0–46.0)
Hemoglobin: 9.4 g/dL — ABNORMAL LOW (ref 12.0–15.0)
MCHC: 32 g/dL (ref 30.0–36.0)
MCV: 94.3 fL (ref 78.0–100.0)
Platelets: 277 10*3/uL (ref 150.0–400.0)
RBC: 3.13 Mil/uL — ABNORMAL LOW (ref 3.87–5.11)
RDW: 16.6 % — ABNORMAL HIGH (ref 11.5–15.5)
WBC: 6.2 10*3/uL (ref 4.0–10.5)

## 2023-05-19 NOTE — Progress Notes (Signed)
 OFFICE VISIT  05/19/2023  CC:  Chief Complaint  Patient presents with   Arm Pain    Left; radiating up to elbow sometimes.     [name] who presents for follow-up chronic pain syndrome, iron  deficiency anemia, and hypertension, as well as gradually worsening chronic bilateral wrist pain.  INTERIM HX: We treated her a few weeks ago for influenza A with acute asthma exacerbation.  She feels back to baseline now.  Her wrists and thumbs still hurt all the time.  Occasionally they wake her up at night. Left wrist x-ray last visit showed severe radiocarpal and first carpometacarpal arthritis.  She has a history of the same on the right wrist.  Wearing a splint helps keep it still so certain movements do not exacerbate her pain worse.  Overall she does not feel like the splint is helping much though. A trial of Voltaren  gel in the past has not helped so she stopped it. Heat application helps a little bit.  Last month (04/28/23) her hemoglobin had dropped significantly down to 7.5.  MCV 95, WBC and platelets normal.  I started her on iron  tablets and referred her to gastroenterology.  Hemoccults ordered. She denies melena or hematochezia.  No abdominal pain. She does not take NSAIDs.  Overall her pain is not well-controlled, particularly in the wrist and hands. Low back does bother her, has right greater than left sciatica symptoms.  Indication for chronic opioid: osteoarthritis in hands, low back, feet, L hip, knees, wrists+chemotherapy -induced neuropathy (burning and numbness in feet). Takes 1-2 hydrocodone  tabs a day typically.  Not NSAID candidate due to renal insufficiency. PMP AWARE reviewed today: most recent rx for Vicodin was filled 04/11/2023, # 45, rx by me. No red flags.  Dr. Trixie manages her diabetes and hypothyroidism.   Past Medical History:  Diagnosis Date   Allergic rhinitis    Allergy testing: results pending as of 05/28/16 (Dr. Fleeta Smock)    Anemia 10/2020   anemia of chronic dz (? + mild IDA?--iron  started   Arthritis    Chemotherapy-induced neuropathy (HCC) 2019   Cough variant asthma    DDD (degenerative disc disease), lumbar    Diabetes mellitus with complication (HCC)    Mild nonprolif DR R eye 12/2017-->referred to retinal specialist DM MANAGED BY DR. GHERGHE AS OF 2021   Endometrial cancer (HCC) 02/2018   REMISSION AS OF 08/2018.  Stage III (T3, Nx, Mx)  Path: endometroid adenocarcinoma involving cervix, uterus, and fallopian tubes (Pt got TAH & BSO 02/2018). No sign of recurrence as of onc f/u 05/2019.   GERD (gastroesophageal reflux disease)    Hemorrhoids    Herpes zoster 10/2015   L side belt-line   History of adenomatous polyp of colon 02/25/2013   5 mm cecal polyp removed by Dr. Alfrieda 5 yrs   History of blood transfusion    History of cellulitis 08/2010   Left (Since replacemnt of Left knee   History of radiation therapy 05/21/2018   [facility] brachytherapy 04/24/2018-05/21/2018  Dr Lynwood Nasuti   Hyperkalemia 03/2018   started after pt started getting chemo-->had to stop enalapril .   Hyperlipidemia    Not on statin b/c lipids stayed down when sugars came down per pt.  She says Dr. Tommas knows she is not on statin anymore.   Hypertension    Hypothyroidism    IDA (iron  deficiency anemia)    GI ref 04/2023   Nephrolithiasis 07/2005   Obesity  OSA on CPAP    Osteoarthritis    knees, ankle, + ? scapholunate ligament disruption (x-ray 06/2017).  Progressive arthritis L wrist and hand 09/2022 radiographs   Pancytopenia due to antineoplastic chemotherapy [facility])    progressive as of 06/2018. Stable 08/2018.   Recurrent UTI    started cipro  250 qd 09/2018   Sciatica of left side    Severe persistent asthma    cough-variant--saw Allergist 05/25/16 and was switched from max dose advair to symbicort .   Systolic murmur    ECHO fine 10/2016-->murmur likely flow murmur assoc with HTN.   Zoon's vulvitis    Bx-proven  (GYN) -lichen sclerosis.  Clobetasol  0.05% ointment per GYN    Past Surgical History:  Procedure Laterality Date   ANKLE FRACTURE SURGERY  1984   Pin & repair   ARTHROSCOPIC REPAIR ACL  2000   Due to ACL tear   ARTHROSCOPIC REPAIR ACL  5/08   Blateral knee rerlacements x4 2 times each knee     BREAST BIOPSY Right 2014   Benign   CARDIAC CATHETERIZATION  5/07   clear vessel mild mitral    CARPAL TUNNEL RELEASE Right [id number]   1989 Left   CESAREAN SECTION  1985   CHOLECYSTECTOMY OPEN  1988   COLONOSCOPY N/A 02/25/2013   Tubular adenoma x 1: Recall 5 yrs. Procedure: COLONOSCOPY;  Surgeon: Renaye Sous, MD;  Location: [facility];  Service: Endoscopy;  Laterality: N/A;   COMBINED HYSTEROSCOPY DIAGNOSTIC / D&C  2/12   Bx neg   DEXA  08/2007; 05/12/20   Bone density normal 2009 and 2022.  Plan rpt 2024.   DILATION AND CURETTAGE OF UTERUS  12/11   IR IMAGING GUIDED PORT INSERTION  03/16/2018   IR REMOVAL TUN ACCESS W/ PORT W/O FL MOD SED  09/20/2018   LYMPH NODE BIOPSY N/A 02/19/2018   Procedure: Sentinel LYMPH NODE BIOPSY;  Surgeon: Eloy Herring, MD;  Location: [facility];  Service: Gynecology;  Laterality: N/A;   REPLACEMENT TOTAL KNEE Left 5/12   X 2 on each   ROBOTIC ASSISTED TOTAL HYSTERECTOMY WITH BILATERAL SALPINGO OOPHERECTOMY N/A 02/19/2018   For endometrial cancer.  Procedure: XI ROBOTIC ASSISTED TOTAL HYSTERECTOMY WITH BILATERAL SALPINGO OOPHORECTOMY;  Surgeon: Eloy Herring, MD;  Location: [facility];  Service: Gynecology;  Laterality: N/A;   TRANSTHORACIC ECHOCARDIOGRAM  10/11/2016    EF 55-60%, grd I DD.   TUBAL LIGATION  1985   C-Section    Outpatient Medications Prior to Visit  Medication Sig Dispense Refill   albuterol  (PROVENTIL ) (2.5 MG/3ML) 0.083% nebulizer solution Take 3 mLs (2.5 mg total) by nebulization every 4 (four) hours as needed for wheezing or shortness of breath (Dx: J45.50). 75 mL 1   albuterol  (VENTOLIN  HFA) 108 (90 Base)  MCG/ACT inhaler INHALE 2 PUFFS INTO LUNGS EVERY 6 HOURS AS NEEDED FOR WHEEZING/SHORTNESS OF BREATH 18 g 1   amitriptyline  (ELAVIL ) 25 MG tablet Take 2 tablets (50 mg total) by mouth at bedtime. 180 tablet 1   Apoaequorin (PREVAGEN PO) Take by mouth daily.     clobetasol  ointment (TEMOVATE ) 0.05 % APPLY 1 APPLICATION TOPICALLY AS NEEDED. APPLY TO THE SKIN OF THE VULVA FOR ITCHING 30 g 2   diclofenac  Sodium (VOLTAREN ) 1 % GEL Apply 2 g topically 4 (four) times daily. 100 g 3   felodipine  (PLENDIL ) 10 MG 24 hr tablet Take 1 tablet (10 mg total) by mouth daily. 90 tablet 0   Ferrous Sulfate (IRON  PO)  Take by mouth.     FIASP  FLEXTOUCH 100 UNIT/ML FlexTouch Pen SMARTSIG:20-25 Unit(s) SUB-Q 3 Times Daily     fluconazole  (DIFLUCAN ) 150 MG tablet Take 1 tablet (150 mg total) by mouth daily. Take 2nd tablet 72 hours later 2 tablet 0   fluticasone  (CUTIVATE ) 0.05 % cream APPLY TO AFFECTED AREA OF RIGHT LOWER LEG TWICE PER DAY. 60 g 1   fluticasone  (VERAMYST) 27.5 MCG/SPRAY nasal spray Place 1 spray into the nose daily as needed for rhinitis.     furosemide  (LASIX ) 20 MG tablet Take 1 tablet (20 mg total) by mouth daily. 90 tablet 1   HYDROcodone -acetaminophen  (NORCO) 10-325 MG tablet 1/2 - 1 tab po bid prn pain 45 tablet 0   Insulin  Pen Needle 32G X 4 MM MISC Use 3x a day 300 each 3   Insulin  Syringes, Disposable, U-100 0.3 ML MISC Use to inject insulin --6 injections per day 540 each 3   levocetirizine (XYZAL) 5 MG tablet Take 5 mg by mouth at bedtime as needed for allergies.   5   levothyroxine  (SYNTHROID ) 125 MCG tablet Take 1 tablet (125 mcg total) by mouth daily. 90 tablet 1   lisinopril  (ZESTRIL ) 10 MG tablet Take 1 tablet (10 mg total) by mouth daily. 90 tablet 0   metFORMIN  (GLUCOPHAGE ) 500 MG tablet TAKE 1 TABLET BY MOUTH EVERY MORNING AND 2 TABLETS BY MOUTH AT BEDTIME. Strength: 500 mg 270 tablet 3   miconazole (MICOTIN) 2 % powder Apply 1 application topically 2 (two) times daily as needed for  itching.     Mometasone  Furo-Formoterol  Fum (DULERA ) 50-5 MCG/ACT AERO 2 puffs bid 3 each 1   mupirocin  ointment (BACTROBAN ) 2 % Apply 1 Application topically 3 (three) times daily. Apply to affected areas tid x 10d 15 g 0   NOVOLIN  N 100 UNIT/ML injection INJECT 30 UNITS TOTAL (0.3 ML) INTO THE SKIN 3 (THREE) TIMES DAILY BEFORE MEALS. 90 mL 1   pravastatin  (PRAVACHOL ) 40 MG tablet Take 1 tablet (40 mg total) by mouth daily. 90 tablet 1   Probiotic Product (PROBIOTIC PO) Take 1 capsule by mouth daily.     VITAMIN D PO Take by mouth.     predniSONE  (DELTASONE ) 20 MG tablet 2 tabs po every day x 5d (Patient not taking: Reported on 05/19/2023) 10 tablet 0   No facility-administered medications prior to visit.    Allergies  Allergen Reactions   Adhesive [Tape] Other (See Comments)    Takes patient's skin off.   Banana Diarrhea and Nausea Only   Egg-Derived Products Diarrhea and Nausea Only   Latex Other (See Comments)    sensativity   Linzess [Linaclotide] Diarrhea   Ozempic  (0.25 Or 0.5 Mg-Dose) [Semaglutide (0.25 Or 0.5mg -Dos)] Diarrhea   Penicillins Rash and Other (See Comments)    Has had cephalosporins without incident   Pine Swelling and Rash   Rose Swelling and Rash    Review of Systems As per HPI  PE:    05/19/2023   11:08 AM 04/28/2023   11:22 AM 01/19/2023   11:24 AM  Vitals with BMI  Height 5' 1  5' 1  Weight 239 lbs 6 oz 247 lbs 10 oz 249 lbs  BMI 45.26  47.07  Systolic 135 135 865  Diastolic 67 71 60  Pulse 103 90 97     Physical Exam  Gen: Alert, well appearing.  Patient is oriented to person, place, time, and situation. AFFECT: pleasant, lucid thought and speech. No further exam  today.  LABS:  Last CBC Lab Results  Component Value Date   WBC 6.6 04/28/2023   HGB 7.5 Repeated and verified X2. (LL) 04/28/2023   HCT 23.5 Repeated and verified X2. (LL) 04/28/2023   MCV 95.3 04/28/2023   MCH 34.6 (H) 09/20/2018   RDW 17.7 (H) 04/28/2023   PLT 294.0  04/28/2023   Lab Results  Component Value Date   IRON  49 02/02/2021   TIBC 364 02/02/2021   FERRITIN 31 02/02/2021   Last metabolic panel Lab Results  Component Value Date   GLUCOSE 38 (LL) 04/28/2023   NA 140 04/28/2023   K 4.4 04/28/2023   CL 107 04/28/2023   CO2 25 04/28/2023   BUN 34 (H) 04/28/2023   CREATININE 1.17 04/28/2023   GFR 46.68 (L) 04/28/2023   CALCIUM 8.9 04/28/2023   PROT 7.1 04/28/2023   ALBUMIN 3.7 04/28/2023   BILITOT 0.2 04/28/2023   ALKPHOS 58 04/28/2023   AST 11 04/28/2023   ALT 8 04/28/2023   ANIONGAP 10 06/16/2020   Last lipids Lab Results  Component Value Date   CHOL 110 04/28/2023   HDL 28.70 (L) 04/28/2023   LDLCALC 45 04/28/2023   LDLDIRECT 78.0 10/05/2018   TRIG 185.0 (H) 04/28/2023   CHOLHDL 4 04/28/2023   Last hemoglobin A1c Lab Results  Component Value Date   HGBA1C 6.4 (A) 01/18/2023   Last thyroid  functions Lab Results  Component Value Date   TSH 4.18 04/28/2023   IMPRESSION AND PLAN:  #1 bilateral severe degenerative arthritis of the wrists and CMC joints. She is pretty miserable with the pain. Given the severe joint space narrowing + osteophytes I do not think injection is technically feasible.  She saw Dr. Camella back in 2019. Will refer back to him again today.  #2 iron  deficiency anemia. She has been on iron  for the last few weeks. She will pick up Hemoccult here today. We have initiated GI referral.  3.  Hypertension, well-controlled on felodipine  10 mg a day and lisinopril  10 mg a day.   Weight is down 22 pounds in the last year.  An After Visit Summary was printed and given to the patient.  FOLLOW UP: No follow-ups on file.  Signed:  Gerlene Hockey, MD           05/19/2023

## 2023-05-20 LAB — IRON,TIBC AND FERRITIN PANEL
%SAT: 11 % — ABNORMAL LOW (ref 16–45)
Ferritin: 46 ng/mL (ref 16–288)
Iron: 35 ug/dL — ABNORMAL LOW (ref 45–160)
TIBC: 308 ug/dL (ref 250–450)

## 2023-05-23 ENCOUNTER — Encounter: Payer: Self-pay | Admitting: Family Medicine

## 2023-06-19 ENCOUNTER — Other Ambulatory Visit: Payer: Self-pay | Admitting: Family Medicine

## 2023-06-25 ENCOUNTER — Other Ambulatory Visit: Payer: Self-pay | Admitting: Family Medicine

## 2023-06-30 ENCOUNTER — Other Ambulatory Visit: Payer: Self-pay | Admitting: Family Medicine

## 2023-06-30 MED ORDER — HYDROCODONE-ACETAMINOPHEN 10-325 MG PO TABS
ORAL_TABLET | ORAL | 0 refills | Status: AC
Start: 2023-06-30 — End: ?

## 2023-06-30 NOTE — Telephone Encounter (Signed)
 Requesting: HYDROcodone-acetaminophen (NORCO) 10-325 MG tablet  Contract: 02/02/21 UDS: 02/02/21 Last Visit: 05/19/23 Next Visit: advised to f/u 3 mo Last Refill: 04/11/23 (45,0)  Please Advise. Med pending

## 2023-07-04 ENCOUNTER — Other Ambulatory Visit: Payer: Self-pay | Admitting: Family Medicine

## 2023-07-04 ENCOUNTER — Other Ambulatory Visit: Payer: Self-pay | Admitting: Internal Medicine

## 2023-07-06 NOTE — Telephone Encounter (Signed)
 No further action needed at this time.

## 2023-07-07 ENCOUNTER — Telehealth: Payer: Self-pay | Admitting: Dietician

## 2023-07-07 NOTE — Telephone Encounter (Signed)
 [facility] left a message re:  patient needs a more current chart note to receive her next CGM prescription coverage. Returned their call.  Patient has not had a visit since the 01/18/2023 date and no future visits have been scheduled.  They are to call patient to have her schedule an appointment to prevent delays of her next CGM prescription coverage.  Oran Rein, RD, LDN, CDCES, DipACLM

## 2023-07-17 ENCOUNTER — Telehealth: Payer: Self-pay | Admitting: Internal Medicine

## 2023-07-17 NOTE — Telephone Encounter (Signed)
 MEDICATION: FreeStyle Libre 2 Sensor  [facility]  HAS THE PATIENT CONTACTED THEIR PHARMACY?  Yes  IS THIS A 90 DAY SUPPLY : Yes  IS PATIENT OUT OF MEDICATION: No  IF NOT; HOW MUCH IS LEFT: 2 weeks  LAST APPOINTMENT DATE: @10 /12/2022  NEXT APPOINTMENT DATE:@7 /12/2023  DO WE HAVE YOUR PERMISSION TO LEAVE A DETAILED MESSAGE?: Yes  OTHER COMMENTS:    **Let patient know to contact pharmacy at the end of the day to make sure medication is ready. **  ** Please notify patient to allow 48-72 hours to process**  **Encourage patient to contact the pharmacy for refills or they can request refills through [facility]**

## 2023-07-18 DIAGNOSIS — Z860100 Personal history of colon polyps, unspecified: Secondary | ICD-10-CM | POA: Diagnosis not present

## 2023-07-18 DIAGNOSIS — D500 Iron deficiency anemia secondary to blood loss (chronic): Secondary | ICD-10-CM | POA: Diagnosis not present

## 2023-07-18 NOTE — Telephone Encounter (Signed)
 Order Submitted via [facility] [place].

## 2023-07-20 ENCOUNTER — Other Ambulatory Visit: Payer: Self-pay | Admitting: Internal Medicine

## 2023-07-22 ENCOUNTER — Other Ambulatory Visit: Payer: Self-pay | Admitting: Internal Medicine

## 2023-07-22 ENCOUNTER — Other Ambulatory Visit: Payer: Self-pay | Admitting: Family Medicine

## 2023-07-23 ENCOUNTER — Other Ambulatory Visit: Payer: Self-pay | Admitting: Family Medicine

## 2023-07-24 ENCOUNTER — Other Ambulatory Visit: Payer: Self-pay | Admitting: Internal Medicine

## 2023-07-27 ENCOUNTER — Other Ambulatory Visit: Payer: Self-pay | Admitting: Internal Medicine

## 2023-07-27 ENCOUNTER — Other Ambulatory Visit: Payer: Self-pay

## 2023-07-27 MED ORDER — LYUMJEV KWIKPEN 100 UNIT/ML ~~LOC~~ SOPN: PEN_INJECTOR | SUBCUTANEOUS | 1 refills | Status: DC

## 2023-07-28 ENCOUNTER — Other Ambulatory Visit (HOSPITAL_COMMUNITY): Payer: Self-pay

## 2023-07-28 ENCOUNTER — Telehealth: Payer: Self-pay

## 2023-07-28 NOTE — Telephone Encounter (Signed)
 Per test claims it seems that the only covered fast acting insulin  is the Generic Novolog /Insulin  Aspart. Test claim shows 90 days/66ml is $105.00

## 2023-08-03 ENCOUNTER — Telehealth: Payer: Self-pay | Admitting: *Deleted

## 2023-08-03 NOTE — Telephone Encounter (Signed)
 Spoke with [name] who called the office to make a follow up appt. With Dr. Gaylin Ke. Pt was scheduled for Wednesday, May 7th. At 11:15. Pt agreed to date and time and had no further concerns.

## 2023-08-07 ENCOUNTER — Other Ambulatory Visit: Payer: Self-pay

## 2023-08-07 DIAGNOSIS — D500 Iron deficiency anemia secondary to blood loss (chronic): Secondary | ICD-10-CM | POA: Diagnosis not present

## 2023-08-07 MED ORDER — NOVOLOG FLEXPEN 100 UNIT/ML ~~LOC~~ SOPN
PEN_INJECTOR | SUBCUTANEOUS | 1 refills | Status: DC
Start: 2023-08-07 — End: 2023-08-16

## 2023-08-08 ENCOUNTER — Other Ambulatory Visit: Payer: Self-pay | Admitting: Internal Medicine

## 2023-08-16 ENCOUNTER — Inpatient Hospital Stay: Admitting: Obstetrics & Gynecology

## 2023-08-16 DIAGNOSIS — C541 Malignant neoplasm of endometrium: Secondary | ICD-10-CM

## 2023-08-16 MED ORDER — LYUMJEV KWIKPEN 100 UNIT/ML ~~LOC~~ SOPN
10.0000 [IU] | PEN_INJECTOR | Freq: Three times a day (TID) | SUBCUTANEOUS | 2 refills | Status: DC
Start: 2023-08-16 — End: 2023-09-01

## 2023-08-16 NOTE — Addendum Note (Signed)
 Addended by: Vernon Goodpasture on: 08/16/2023 02:17 PM   Modules accepted: Orders

## 2023-08-17 ENCOUNTER — Other Ambulatory Visit: Payer: Self-pay | Admitting: Family Medicine

## 2023-08-22 ENCOUNTER — Encounter: Payer: Self-pay | Admitting: Family Medicine

## 2023-08-22 DIAGNOSIS — K5750 Diverticulosis of both small and large intestine without perforation or abscess without bleeding: Secondary | ICD-10-CM | POA: Diagnosis not present

## 2023-08-22 DIAGNOSIS — K635 Polyp of colon: Secondary | ICD-10-CM | POA: Diagnosis not present

## 2023-08-22 DIAGNOSIS — K2950 Unspecified chronic gastritis without bleeding: Secondary | ICD-10-CM | POA: Diagnosis not present

## 2023-08-22 DIAGNOSIS — D509 Iron deficiency anemia, unspecified: Secondary | ICD-10-CM | POA: Diagnosis not present

## 2023-08-22 DIAGNOSIS — D122 Benign neoplasm of ascending colon: Secondary | ICD-10-CM | POA: Diagnosis not present

## 2023-08-22 DIAGNOSIS — K259 Gastric ulcer, unspecified as acute or chronic, without hemorrhage or perforation: Secondary | ICD-10-CM | POA: Diagnosis not present

## 2023-08-22 DIAGNOSIS — I10 Essential (primary) hypertension: Secondary | ICD-10-CM | POA: Diagnosis not present

## 2023-08-22 LAB — HM COLONOSCOPY

## 2023-08-23 ENCOUNTER — Encounter: Payer: Self-pay | Admitting: Family Medicine

## 2023-08-25 ENCOUNTER — Other Ambulatory Visit: Payer: Self-pay | Admitting: Family Medicine

## 2023-08-25 NOTE — Telephone Encounter (Signed)
 LVM for pt to return call, currently due for 3 month follow up with PCP.

## 2023-08-27 ENCOUNTER — Other Ambulatory Visit: Payer: Self-pay | Admitting: Family Medicine

## 2023-09-01 ENCOUNTER — Ambulatory Visit: Payer: Self-pay | Admitting: Family Medicine

## 2023-09-01 ENCOUNTER — Encounter: Payer: Self-pay | Admitting: Family Medicine

## 2023-09-01 ENCOUNTER — Ambulatory Visit (INDEPENDENT_AMBULATORY_CARE_PROVIDER_SITE_OTHER): Admitting: Family Medicine

## 2023-09-01 ENCOUNTER — Telehealth: Payer: Self-pay

## 2023-09-01 VITALS — BP 136/76 | HR 99 | Temp 98.0°F | Ht 61.0 in | Wt 240.4 lb

## 2023-09-01 DIAGNOSIS — G894 Chronic pain syndrome: Secondary | ICD-10-CM

## 2023-09-01 DIAGNOSIS — Z79899 Other long term (current) drug therapy: Secondary | ICD-10-CM

## 2023-09-01 DIAGNOSIS — M545 Low back pain, unspecified: Secondary | ICD-10-CM | POA: Diagnosis not present

## 2023-09-01 DIAGNOSIS — I10 Essential (primary) hypertension: Secondary | ICD-10-CM | POA: Diagnosis not present

## 2023-09-01 DIAGNOSIS — N1832 Chronic kidney disease, stage 3b: Secondary | ICD-10-CM

## 2023-09-01 DIAGNOSIS — M19031 Primary osteoarthritis, right wrist: Secondary | ICD-10-CM

## 2023-09-01 DIAGNOSIS — G8929 Other chronic pain: Secondary | ICD-10-CM

## 2023-09-01 DIAGNOSIS — M19032 Primary osteoarthritis, left wrist: Secondary | ICD-10-CM | POA: Diagnosis not present

## 2023-09-01 DIAGNOSIS — D508 Other iron deficiency anemias: Secondary | ICD-10-CM

## 2023-09-01 LAB — BASIC METABOLIC PANEL WITH GFR
BUN: 30 mg/dL — ABNORMAL HIGH (ref 6–23)
CO2: 27 meq/L (ref 19–32)
Calcium: 9.1 mg/dL (ref 8.4–10.5)
Chloride: 105 meq/L (ref 96–112)
Creatinine, Ser: 1.27 mg/dL — ABNORMAL HIGH (ref 0.40–1.20)
GFR: 42.2 mL/min — ABNORMAL LOW (ref >60.00)
Glucose, Bld: 105 mg/dL — ABNORMAL HIGH (ref 70–99)
Potassium: 4.5 meq/L (ref 3.5–5.1)
Sodium: 141 meq/L (ref 135–145)

## 2023-09-01 LAB — CBC
HCT: 24.9 % — ABNORMAL LOW (ref 36.0–46.0)
Hemoglobin: 7.9 g/dL — CL (ref 12.0–15.0)
MCHC: 31.6 g/dL (ref 30.0–36.0)
MCV: 88 fl (ref 78.0–100.0)
Platelets: 313 10*3/uL (ref 150.0–400.0)
RBC: 2.83 Mil/uL — ABNORMAL LOW (ref 3.87–5.11)
RDW: 17.5 % — ABNORMAL HIGH (ref 11.5–15.5)
WBC: 7.3 10*3/uL (ref 4.0–10.5)

## 2023-09-01 NOTE — Telephone Encounter (Signed)
 [facility] called to report critical hemoglobin of 7.9.

## 2023-09-01 NOTE — Progress Notes (Unsigned)
 OFFICE VISIT  09/04/2023  CC:  Chief Complaint  Patient presents with   Medical Management of Chronic Issues    Pt is not fasting    [name] who presents for follow-up chronic pain syndrome, iron deficiency anemia, hypertension. A/P as of last visit: "#1 bilateral severe degenerative arthritis of the wrists and CMC joints. She is pretty miserable with the pain. Given the severe joint space narrowing + osteophytes I do not think injection is technically feasible.  She saw Dr. Aloha Arnold back in 2019. Will refer back to him again today.   #2 iron deficiency anemia. She has been on iron for the last few weeks. She will pick up Hemoccult here today. We have initiated GI referral.   3.  Hypertension, well-controlled on felodipine  10 mg a day and lisinopril  10 mg a day.    Weight is down 22 pounds in the last year."  INTERIM HX: Home blood pressures consistently normal.  Hemoccult cards were never turned in.  She got an iron infusion 08/07/2023. She got EGD and colonoscopy on 08/22/2023.  No site for bleeding was detected.  No records available yet. GI prescribed Prilosec.  Her pain is moderately well-controlled.  She has adjusted life activities to deal with it. Biggest areas of pain are her arthritis in her wrists and hands and her neuropathic pain in her feet.  She also describes some osteoarthritic pain in the left foot. Indication for chronic opioid: osteoarthritis in hands, low back, feet, L hip, knees, wrists+chemotherapy -induced neuropathy (burning and numbness in feet). Takes 1-2 hydrocodone  tabs a day typically.  Not NSAID candidate due to renal insufficiency. PMP AWARE reviewed today: most recent rx for Vicodin 10/325 was filled 06/30/2023, # 45, rx by me. No red flags.  Dr. Aldona Amel manages her diabetes and hypothyroidism.  ROS as above, plus--> no fevers, no CP, no SOB, no wheezing, no cough, no dizziness, no HAs, no rashes, no melena/hematochezia.  No  polyuria or polydipsia.    No focal weakness, paresthesias, or tremors.  No acute vision or hearing abnormalities.  No dysuria or unusual/new urinary urgency or frequency.  No recent changes in lower legs. No n/v/d or abd pain.  No palpitations.    Past Medical History:  Diagnosis Date   Allergic rhinitis    Allergy testing: results pending as of 05/28/16 (Dr. Seabron Cypress)   Anemia 10/2020   anemia of chronic dz (? + mild IDA?--iron started   Arthritis    Chemotherapy-induced neuropathy (HCC) 2019   Cough variant asthma    DDD (degenerative disc disease), lumbar    Diabetes mellitus with complication (HCC)    Mild nonprolif DR R eye 12/2017-->referred to retinal specialist DM MANAGED BY DR. GHERGHE AS OF 2021   Endometrial cancer (HCC) 02/2018   REMISSION AS OF 08/2018.  Stage III (T3, Nx, Mx)  Path: endometroid adenocarcinoma involving cervix, uterus, and fallopian tubes (Pt got TAH & BSO 02/2018). No sign of recurrence as of onc f/u 05/2019.   GERD (gastroesophageal reflux disease)    Hemorrhoids    Herpes zoster 10/2015   L side belt-line   History of adenomatous polyp of colon 02/25/2013   5 mm cecal polyp removed by Dr. Derl Flemings 5 yrs   History of blood transfusion    History of cellulitis 08/2010   Left (Since replacemnt of Left knee   History of radiation therapy 05/21/2018   [facility] brachytherapy 04/24/2018-05/21/2018  Dr Retta Caster  Hyperkalemia 03/2018   started after pt started getting chemo-->had to stop enalapril .   Hyperlipidemia    Not on statin b/c lipids "stayed down when sugars came down" per pt.  She says Dr. Ronelle Coffee knows she is not on statin anymore.   Hypertension    Hypothyroidism    IDA (iron deficiency anemia)    GI ref 04/2023   Nephrolithiasis 07/2005   Obesity    OSA on CPAP    Osteoarthritis    knees, ankle, + ? scapholunate ligament disruption (x-ray 06/2017).  Progressive arthritis L wrist and hand 09/2022 radiographs   Pancytopenia due to  antineoplastic chemotherapy [facility])    progressive as of 06/2018. Stable 08/2018.   Recurrent UTI    started cipro  250 qd 09/2018   Sciatica of left side    Severe persistent asthma    cough-variant--saw Allergist 05/25/16 and was switched from max dose advair to symbicort .   Systolic murmur    ECHO fine 10/2016-->murmur likely flow murmur assoc with HTN.   Zoon's vulvitis    Bx-proven (GYN) -lichen sclerosis.  Clobetasol  0.05% ointment per GYN    Past Surgical History:  Procedure Laterality Date   ANKLE FRACTURE SURGERY  1984   Pin & repair   ARTHROSCOPIC REPAIR ACL  2000   Due to ACL tear   ARTHROSCOPIC REPAIR ACL  08/2006   Blateral knee rerlacements x4 2 times each knee     BREAST BIOPSY Right 2014   Benign   CARDIAC CATHETERIZATION  08/2005   clear vessel mild mitral    CARPAL TUNNEL RELEASE Right [id number]   1989 Left   CESAREAN SECTION  1985   CHOLECYSTECTOMY OPEN  1988   COLONOSCOPY N/A 02/25/2013   Tubular adenoma x 1: Recall 5 yrs. Procedure: COLONOSCOPY;  Surgeon: Tami Falcon, MD;  Location: [facility];  Service: Endoscopy;  Laterality: N/A;   COLONOSCOPY     08/22/2023 (IDA) [facility]--> 1 polyp, +diverticulosis.   COMBINED HYSTEROSCOPY DIAGNOSTIC / D&C  05/2010   Bx neg   DEXA  08/2007; 05/12/20   Bone density normal 2009 and 2022.  Plan rpt 2024.   DILATION AND CURETTAGE OF UTERUS  03/2010   ESOPHAGOGASTRODUODENOSCOPY     08/22/2023 [facility] (IDA): antral erythema, nothing to explain bleeding   IR IMAGING GUIDED PORT INSERTION  03/16/2018   IR REMOVAL TUN ACCESS W/ PORT W/O FL MOD SED  09/20/2018   LYMPH NODE BIOPSY N/A 02/19/2018   Procedure: Sentinel LYMPH NODE BIOPSY;  Surgeon: Alphonso Aschoff, MD;  Location: [facility];  Service: Gynecology;  Laterality: N/A;   REPLACEMENT TOTAL KNEE Left 08/2010   X 2 on each   ROBOTIC ASSISTED TOTAL HYSTERECTOMY WITH BILATERAL SALPINGO OOPHERECTOMY N/A 02/19/2018   For endometrial cancer.  Procedure: XI  ROBOTIC ASSISTED TOTAL HYSTERECTOMY WITH BILATERAL SALPINGO OOPHORECTOMY;  Surgeon: Alphonso Aschoff, MD;  Location: [facility] [place];  Service: Gynecology;  Laterality: N/A;   TRANSTHORACIC ECHOCARDIOGRAM  10/11/2016    EF 55-60%, grd I DD.   TUBAL LIGATION  1985   C-Section    Outpatient Medications Prior to Visit  Medication Sig Dispense Refill   albuterol  (PROVENTIL ) (2.5 MG/3ML) 0.083% nebulizer solution Take 3 mLs (2.5 mg total) by nebulization every 4 (four) hours as needed for wheezing or shortness of breath (Dx: J45.50). 75 mL 1   albuterol  (VENTOLIN  HFA) 108 (90 Base) MCG/ACT inhaler INHALE 2 PUFFS INTO LUNGS EVERY 6 HOURS AS NEEDED FOR WHEEZING/SHORTNESS  OF BREATH 18 g 1   amitriptyline  (ELAVIL ) 25 MG tablet TAKE 2 TABLETS (50 MG TOTAL) BY MOUTH AT BEDTIME. OFFICE VISIT NEEDED FOR FURTHER REFILLS 60 tablet 0   clobetasol  ointment (TEMOVATE ) 0.05 % APPLY 1 APPLICATION TOPICALLY AS NEEDED. APPLY TO THE SKIN OF THE VULVA FOR ITCHING 30 g 2   diclofenac  Sodium (VOLTAREN ) 1 % GEL Apply 2 g topically 4 (four) times daily. 100 g 3   felodipine  (PLENDIL ) 10 MG 24 hr tablet TAKE 1 TABLET BY MOUTH EVERY DAY 90 tablet 0   Ferrous Sulfate (IRON PO) Take by mouth.     fluticasone  (CUTIVATE ) 0.05 % cream APPLY TO AFFECTED AREA OF RIGHT LOWER LEG TWICE PER DAY. 60 g 1   fluticasone  (VERAMYST) 27.5 MCG/SPRAY nasal spray Place 1 spray into the nose daily as needed for rhinitis.     furosemide  (LASIX ) 20 MG tablet Take 1 tablet (20 mg total) by mouth daily. 90 tablet 1   HYDROcodone -acetaminophen  (NORCO) 10-325 MG tablet 1/2 - 1 tab po bid prn pain 45 tablet 0   Insulin  Pen Needle 32G X 4 MM MISC Use 3x a day 300 each 3   Insulin  Syringes, Disposable, U-100 0.3 ML MISC Use to inject insulin --6 injections per day 540 each 3   levocetirizine (XYZAL) 5 MG tablet Take 5 mg by mouth at bedtime as needed for allergies.   5   levothyroxine  (SYNTHROID ) 125 MCG tablet TAKE 1 TABLET (125 MCG TOTAL) BY  MOUTH DAILY. OFFICE VISIT NEEDED FOR FURTHER REFILLS 30 tablet 0   lisinopril  (ZESTRIL ) 10 MG tablet TAKE 1 TABLET BY MOUTH EVERY DAY 90 tablet 0   metFORMIN  (GLUCOPHAGE ) 500 MG tablet TAKE 1 TABLET BY MOUTH EVERY MORNING AND 2 TABLETS BY MOUTH AT BEDTIME. STRENGTH: 500 MG 270 tablet 3   miconazole (MICOTIN) 2 % powder Apply 1 application topically 2 (two) times daily as needed for itching.     Mometasone  Furo-Formoterol  Fum (DULERA ) 50-5 MCG/ACT AERO TAKE 2 PUFFS BY MOUTH TWICE A DAY 13 g 3   NOVOLIN  N 100 UNIT/ML injection INJECT 30 UNITS TOTAL (0.3 ML) INTO THE SKIN 3 (THREE) TIMES DAILY BEFORE MEALS. 90 mL 1   omeprazole (PRILOSEC) 40 MG capsule Take 40 mg by mouth daily.     pravastatin  (PRAVACHOL ) 40 MG tablet TAKE 1 TABLET BY MOUTH EVERY DAY 90 tablet 1   Probiotic Product (PROBIOTIC PO) Take 1 capsule by mouth daily.     VITAMIN D PO Take by mouth.     insulin  aspart (FIASP  FLEXTOUCH) 100 UNIT/ML FlexTouch Pen INJECT 10-20 UNITS INTO THE SKIN 3 (THREE) TIMES DAILY BEFORE MEALS. 45 mL 1   Insulin  Lispro-aabc (LYUMJEV  KWIKPEN) 100 UNIT/ML KwikPen Inject 10-20 Units into the skin 3 (three) times daily before meals. 15 MINUTES BEFORE MEALS 60 mL 2   No facility-administered medications prior to visit.    Allergies  Allergen Reactions   Adhesive [Tape] Other (See Comments)    Takes patient's skin off.   Banana Diarrhea and Nausea Only   Egg-Derived Products Diarrhea and Nausea Only   Latex Other (See Comments)    sensativity   Linzess [Linaclotide] Diarrhea   Ozempic  (0.25 Or 0.5 Mg-Dose) [Semaglutide (0.25 Or 0.5mg -Dos)] Diarrhea   Penicillins Rash and Other (See Comments)    Has had cephalosporins without incident   Pine Swelling and Rash   Rose Swelling and Rash    Review of Systems As per HPI  PE:    09/01/2023  10:59 AM 09/01/2023   10:40 AM 05/19/2023   11:08 AM  Vitals with BMI  Height  5\' 1"  5\' 1"   Weight  240 lbs 6 oz 239 lbs 6 oz  BMI  45.45 45.26  Systolic 136  148 135  Diastolic 76 72 67  Pulse  99 103     Physical Exam  Gen: Alert, well appearing.  Patient is oriented to person, place, time, and situation. AFFECT: pleasant, lucid thought and speech. No further exam today  LABS:  Last CBC Lab Results  Component Value Date   WBC 7.3 09/01/2023   HGB 7.9 Repeated and verified X2. (LL) 09/01/2023   HCT 24.9 (L) 09/01/2023   MCV 88.0 09/01/2023   MCH 34.6 (H) 09/20/2018   RDW 17.5 (H) 09/01/2023   PLT 313.0 09/01/2023   Lab Results  Component Value Date   IRON 41 (L) 09/01/2023   TIBC 215 (L) 09/01/2023   FERRITIN 478 (H) 09/01/2023    Last metabolic panel Lab Results  Component Value Date   GLUCOSE 105 (H) 09/01/2023   NA 141 09/01/2023   K 4.5 09/01/2023   CL 105 09/01/2023   CO2 27 09/01/2023   BUN 30 (H) 09/01/2023   CREATININE 1.27 (H) 09/01/2023   GFR 42.20 (L) 09/01/2023   CALCIUM 9.1 09/01/2023   PROT 7.1 04/28/2023   ALBUMIN 3.7 04/28/2023   BILITOT 0.2 04/28/2023   ALKPHOS 58 04/28/2023   AST 11 04/28/2023   ALT 8 04/28/2023   ANIONGAP 10 06/16/2020   Last lipids Lab Results  Component Value Date   CHOL 110 04/28/2023   HDL 28.70 (L) 04/28/2023   LDLCALC 45 04/28/2023   LDLDIRECT 78.0 10/05/2018   TRIG 185.0 (H) 04/28/2023   CHOLHDL 4 04/28/2023   Last hemoglobin A1c Lab Results  Component Value Date   HGBA1C 6.4 (A) 01/18/2023   Last thyroid  functions Lab Results  Component Value Date   TSH 4.18 04/28/2023   Last vitamin B12 and Folate Lab Results  Component Value Date   VITAMINB12 454 10/28/2020   IMPRESSION AND PLAN:  #1 hypertension, well-controlled on felodipine  10 mg a day and lisinopril  10 mg a day. Basic metabolic panel today.  2.  Iron deficiency anemia. Upper and lower endoscopy unrevealing. She is status post iron infusion 1 month ago. Monitor CBC and iron panel today. She will continue PPI as per GI recommendations.  3.  Chronic pain syndrome. Osteoarthritis multiple  sites, peripheral neuropathy in both legs. Stable. Continue Norco 10-325, 1/2-1 tab p.o. twice daily as needed.  A new prescription was not needed today. Controlled substance contract and urine drug screen up-to-date.  #4 chronic renal insufficiency stage IIIa. No NSAIDs. Encouraged good hydration. Basic metabolic panel today.  An After Visit Summary was printed and given to the patient.  FOLLOW UP: Return in about 3 months (around 12/02/2023) for routine chronic illness f/u.  Signed:  Arletha Lady, MD           09/04/2023

## 2023-09-02 LAB — IRON,TIBC AND FERRITIN PANEL
%SAT: 19 % (ref 16–45)
Ferritin: 478 ng/mL — ABNORMAL HIGH (ref 16–288)
Iron: 41 ug/dL — ABNORMAL LOW (ref 45–160)
TIBC: 215 ug/dL — ABNORMAL LOW (ref 250–450)

## 2023-09-08 ENCOUNTER — Other Ambulatory Visit: Payer: Self-pay | Admitting: Family Medicine

## 2023-09-20 ENCOUNTER — Inpatient Hospital Stay

## 2023-09-20 ENCOUNTER — Inpatient Hospital Stay: Attending: Obstetrics & Gynecology | Admitting: Obstetrics & Gynecology

## 2023-09-20 VITALS — BP 142/62 | HR 94 | Temp 97.7°F | Resp 17 | Ht 61.0 in | Wt 239.8 lb

## 2023-09-20 DIAGNOSIS — Z124 Encounter for screening for malignant neoplasm of cervix: Secondary | ICD-10-CM | POA: Insufficient documentation

## 2023-09-20 DIAGNOSIS — Z90710 Acquired absence of both cervix and uterus: Secondary | ICD-10-CM | POA: Insufficient documentation

## 2023-09-20 DIAGNOSIS — Z923 Personal history of irradiation: Secondary | ICD-10-CM | POA: Insufficient documentation

## 2023-09-20 DIAGNOSIS — C541 Malignant neoplasm of endometrium: Secondary | ICD-10-CM | POA: Diagnosis not present

## 2023-09-20 DIAGNOSIS — N904 Leukoplakia of vulva: Secondary | ICD-10-CM | POA: Diagnosis not present

## 2023-09-20 DIAGNOSIS — Z8542 Personal history of malignant neoplasm of other parts of uterus: Secondary | ICD-10-CM | POA: Insufficient documentation

## 2023-09-20 DIAGNOSIS — Z9221 Personal history of antineoplastic chemotherapy: Secondary | ICD-10-CM | POA: Insufficient documentation

## 2023-09-20 DIAGNOSIS — Z08 Encounter for follow-up examination after completed treatment for malignant neoplasm: Secondary | ICD-10-CM | POA: Insufficient documentation

## 2023-09-20 DIAGNOSIS — L292 Pruritus vulvae: Secondary | ICD-10-CM

## 2023-09-20 LAB — WET PREP, GENITAL
Clue Cells Wet Prep HPF POC: NONE SEEN
Sperm: NONE SEEN
Trich, Wet Prep: NONE SEEN
WBC, Wet Prep HPF POC: >=10 — AB (ref <10)
Yeast Wet Prep HPF POC: NONE SEEN

## 2023-09-20 NOTE — Patient Instructions (Addendum)
 Vulvar/Vaginal Moisturizers  Moisturizer Options: Vitamin E oil: pump or capsule form Vitamin E cream (Gene's vitamin E cream) Coconut oil: bottle or bead form Shea butter Blossom Organic Lubricant (organic and all natural; www.blossomorganics.com) PE suppository(coconut oil/vitamin E/palm oil) [employer] Aloe Glide      Consider the ingredients of the product - the fewer the ingredients the better!  Directions for Use: Clean and dry your hands Gently dab the vulvar/vaginal area dry as needed Apply a "pea-sized" amount of the moisturizer onto your fingertip Using you other hand, open the labia   Apply the moisturizer to the vulvar/vaginal tissues Wear loose fitting underwear/clothing if possible following application  Use moisturize 2-3 times daily as desired.  Use soap-free and preservative-free, mild, liquid cleansers. Leave-on cleansers may be preferred to products that need to be rinsed off  Carefully pat the skin dry after cleansing or washing using a soft cloth without rubbing.  Apply barrier creams (eg, petroleum jelly, silicone-based products, soft zinc oxide creams)    Return as needed

## 2023-09-20 NOTE — Assessment & Plan Note (Addendum)
 73 yo with a h/o Endometrial Cancer: stage IIIA grade 1 endometrioid (MMR normal, MSI stable) s/p staging surgery on 02/19/18. S/p adjuvant carboplatin  and paclitaxel  x 6 completed April, 2020. S/p adjuvant vaginal brachy completed February, 2020.     Negative symptom review, normal exam.  No evidence of recurrence   >Annual follow up with a generalist gynecologist is appropriate

## 2023-09-20 NOTE — Progress Notes (Signed)
 Follow Up Note: Gyn-Onc  [name] 73 y.o. female  CC: She presents for a f/u visit   HPI: The oncology history was reviewed.  Interval History: She denies any vaginal bleeding, abdominal/pelvic pain, cough, lethargy or increasing abdominal girth. She was seen in f/u by RAD-ONC in 10/24.  Clinically she was felt to be NED at that visit.   Review of Systems  Review of Systems  Constitutional:  Negative for malaise/fatigue and weight loss.  Respiratory:  Negative for shortness of breath and wheezing.   Cardiovascular:  Negative for chest pain and leg swelling.  Gastrointestinal:  Negative for abdominal pain, blood in stool, constipation, nausea and vomiting.  Genitourinary:  Negative for dysuria, frequency, hematuria and urgency.  Musculoskeletal:  Negative for joint pain and myalgias.  Neurological:  Negative for weakness.  Psychiatric/Behavioral:  Negative for depression. The patient does not have insomnia.    Current medications, allergy, social history, past surgical history, past medical history, family history were all reviewed.    Vitals: BP (!) 142/62 Comment: manual recheck, no S&S, MD aware, pt will monitor at home  Pulse 94   Temp 97.7 F (36.5 C) (Oral)   Resp 17   Ht 5' 1 (1.549 m)   Wt 239 lb 12.8 oz (108.8 kg)   LMP 08/10/2010 Comment: Hysterectomy 02/02/18  SpO2 95%   BMI 45.31 kg/m    Physical Exam:  Physical Exam Exam conducted with a chaperone present.  Constitutional:      General: She is not in acute distress. Cardiovascular:     Rate and Rhythm: Normal rate and regular rhythm.  Pulmonary:     Effort: Pulmonary effort is normal.     Breath sounds: Normal breath sounds. No wheezing or rhonchi.  Abdominal:     Palpations: Abdomen is soft. Edema in pannus skin; erythematous plaques in the skin folds    Tenderness: There is no abdominal tenderness. There is no right CVA tenderness or left CVA tenderness.     Hernia: No hernia is present.   Genitourinary:    General: white, waxy areas; labia minora w/large erosive areas; thick, white discharge    Urethra: No urethral lesion.     Vagina: No lesions. No bleeding Musculoskeletal:     Cervical back: Neck supple.     Right lower leg: No edema.     Left lower leg: No edema.  Lymphadenopathy:     Upper Body:     Right upper body: No supraclavicular adenopathy.     Left upper body: No supraclavicular adenopathy.     Lower Body: No right inguinal adenopathy. No left inguinal adenopathy.  Skin:    Findings: ?dermatome, vessicles, surrounding eyrthema--right groin; erythematous plaque mons Neurological:     Mental Status: She is oriented to person, place, and time.   Assessment/Plan:  Endometrial cancer (HCC) 73 yo with a h/o Endometrial Cancer: stage IIIA grade 1 endometrioid (MMR normal, MSI stable) s/p staging surgery on 02/19/18. S/p adjuvant carboplatin  and paclitaxel  x 6 completed April, 2020. S/p adjuvant vaginal brachy completed February, 2020.     Negative symptom review, normal exam.  No evidence of recurrence   >Annual follow up with a generalist gynecologist is appropriate     I personally spent 25 minutes face-to-face and non-face-to-face in the care of this patient, which includes all pre, intra, and post visit time on the date of service.       Abdul Hodgkin, MD

## 2023-09-21 ENCOUNTER — Encounter: Payer: Self-pay | Admitting: Obstetrics & Gynecology

## 2023-09-25 ENCOUNTER — Encounter: Payer: Self-pay | Admitting: Family Medicine

## 2023-09-25 ENCOUNTER — Ambulatory Visit: Payer: Self-pay

## 2023-09-25 MED ORDER — HYDROCODONE-ACETAMINOPHEN 10-325 MG PO TABS: ORAL_TABLET | ORAL | 0 refills | Status: DC

## 2023-09-25 NOTE — Telephone Encounter (Signed)
 [name] is aware of recent wet prep results as reported by Vira Grieves NP.

## 2023-09-25 NOTE — Telephone Encounter (Signed)
-----   Message from Suellyn Emory sent at 09/25/2023  8:10 AM EDT ----- Please let her know no intervention needed based on wet prep results ----- Message ----- From: Suellyn Emory, NP Sent: 09/22/2023   5:03 PM EDT To: Suellyn Emory, NP   ----- Message ----- From: Dannis Dy, Lab In [place] Sent: 09/20/2023  12:46 PM EDT To: Abdul Hodgkin, MD

## 2023-09-25 NOTE — Telephone Encounter (Signed)
 Requesting: Hydrocodone  Contract: 02/02/21 UDS: 02/02/21 Last Visit: 09/01/23 Next Visit: 12/01/23 Last Refill: 06/30/23 (45,0)  Please Advise. Rx pending

## 2023-09-26 ENCOUNTER — Other Ambulatory Visit: Payer: Self-pay | Admitting: Family Medicine

## 2023-09-28 ENCOUNTER — Other Ambulatory Visit: Payer: Self-pay | Admitting: Family Medicine

## 2023-10-04 ENCOUNTER — Encounter: Payer: Self-pay | Admitting: Family Medicine

## 2023-10-05 NOTE — Telephone Encounter (Signed)
 Ok to do dulera  rx refill, 3 mo supply with 3 additional RF

## 2023-10-06 ENCOUNTER — Other Ambulatory Visit: Payer: Self-pay

## 2023-10-06 MED ORDER — DULERA 50-5 MCG/ACT IN AERO: INHALATION_SPRAY | RESPIRATORY_TRACT | 3 refills | Status: DC

## 2023-10-06 NOTE — Telephone Encounter (Signed)
Refill sent and pt advised.

## 2023-10-08 ENCOUNTER — Other Ambulatory Visit: Payer: Self-pay | Admitting: Family Medicine

## 2023-10-14 ENCOUNTER — Other Ambulatory Visit: Payer: Self-pay | Admitting: Internal Medicine

## 2023-10-18 ENCOUNTER — Encounter: Payer: Self-pay | Admitting: Internal Medicine

## 2023-10-18 ENCOUNTER — Ambulatory Visit (INDEPENDENT_AMBULATORY_CARE_PROVIDER_SITE_OTHER): Admitting: Internal Medicine

## 2023-10-18 VITALS — BP 138/76 | HR 100 | Ht 61.0 in | Wt 239.6 lb

## 2023-10-18 DIAGNOSIS — Z7984 Long term (current) use of oral hypoglycemic drugs: Secondary | ICD-10-CM | POA: Diagnosis not present

## 2023-10-18 DIAGNOSIS — E785 Hyperlipidemia, unspecified: Secondary | ICD-10-CM

## 2023-10-18 DIAGNOSIS — E1142 Type 2 diabetes mellitus with diabetic polyneuropathy: Secondary | ICD-10-CM | POA: Diagnosis not present

## 2023-10-18 DIAGNOSIS — E1165 Type 2 diabetes mellitus with hyperglycemia: Secondary | ICD-10-CM

## 2023-10-18 LAB — POCT GLYCOSYLATED HEMOGLOBIN (HGB A1C): Hemoglobin A1C: 5.8 % — AB (ref 4.0–5.6)

## 2023-10-18 MED ORDER — INSULIN NPH (HUMAN) (ISOPHANE) 100 UNIT/ML ~~LOC~~ SUSP: SUBCUTANEOUS | 3 refills | Status: DC

## 2023-10-18 NOTE — Progress Notes (Signed)
 ID: [name], female   DOB: 04/11/1951, 73 y.o.   MRN: [id number]   HPI: [name] is a 73 y.o.-year-old female, initially referred by her PCP, Allison. Helon, returning for follow-up for DM2, dx in 1992, insulin -dependent since 2004-2005, uncontrolled, with complications (Allison).  Last visit 9 months ago.   On [employer].  Interim history: No increased urination, blurry vision, nausea, chest pain.  Reviewed HbA1c levels: Lab Results  Component Value Date   HGBA1C 6.4 (A) 01/18/2023   HGBA1C 6.6 (A) 07/18/2022   HGBA1C 6.1 (A) 11/29/2021   HGBA1C 6.4 (A) 07/26/2021   HGBA1C 6.6 (A) 03/08/2021   HGBA1C 6.3 (A) 09/01/2020   HGBA1C 9.1 (A) 05/15/2020   HGBA1C 7.7 (A) 12/26/2019   HGBA1C 6.6 (A) 05/07/2019   HGBA1C 8.8 (H) 01/21/2019   She is on: - Metformin  500 mg in am and 1000 mg with dinner Insulin  Before breakfast Before lunch Before dinner Bedtime  Fiasp  >> NovoLog  22-25 >> 18115 15-20 >> taking 18 -  NPH 20 15 - taking 20 - 35 - taking 30   She tried Ozempic  0.5 mg weekly >> significant diarrhea, abd. Pain, food intolerance >> started 04/2019 -stopped 07/2019 mainly because of lack of coverage.  She is checking more than 4 times a day with her CGM.  She has to use skin tac to be able to tolerate the adhesive:  Previously:   Previously:  Lowest sugar was  53 >> 62 >> 50s; she has hypoglycemia awareness in the 70s.  Highest sugar was  300s >> 310 >> 318  Glucometer: [facility]  Pt's meals are: - Breakfast: oatmeal - instant, peacans, butter >> sausage, bacon, apple sauce, velveeta crackers - Lunch: tortilla + PB and J or [place] and cheese >> yoghurt, apple sauce, half a sandwich - Dinner: meat (hamburger, chicken, bratwurst), spaghetti, green beans, mixed veggies - Snacks:   -No CKD, last BUN/creatinine:  Lab Results  Component Value Date   BUN 30 (H) 09/01/2023   BUN 34 (H) 04/28/2023   CREATININE 1.27 (H) 09/01/2023   CREATININE 1.17 04/28/2023  No results found  for: MICRALBCREAT  -+ HL; last set of lipids: Lab Results  Component Value Date   CHOL 110 04/28/2023   HDL 28.70 (L) 04/28/2023   LDLCALC 45 04/28/2023   LDLDIRECT 78.0 10/05/2018   TRIG 185.0 (H) 04/28/2023   CHOLHDL 4 04/28/2023  On pravastatin  20.  - last eye exam was on 2023: No Allison  (previously:+ Allison).  She had cataract surgery 02/23/2021.  -+ numbness and tingling in her feet -chemotherapy related.  Last foot exam 01/2023.  Pt has FH of DM in daughter, brother, father, P aunts.  She also has a history of endometrial cancer - 2019, also, OSA, chemotherapy-induced peripheral neuropathy, hypothyroidism-on levothyroxine  125 mcg daily.  Latest TSH was normal: Lab Results  Component Value Date   TSH 4.18 04/28/2023   She lost a child at 32 y/o in 71 >> she gained 100 lbs and was dx'ed with DM afterwards.  No h/o pancreatitis or FH of MTC.  Brother died of Lewy body dementia.  ROS: + see HPI  I reviewed pt's medications, allergies, PMH, social hx, family hx, and changes were documented in the history of present illness. Otherwise, unchanged from my initial visit note.  Past Medical History:  Diagnosis Date   Allergic rhinitis    Allergy testing: results pending as of 05/28/16 (Allison. Fleeta Peters)   Anemia 10/2020   anemia  of chronic dz (? + mild IDA?--iron started   Arthritis    Chemotherapy-induced neuropathy (HCC) 2019   Cough variant asthma    DDD (degenerative disc disease), lumbar    Diabetes mellitus with complication (HCC)    Mild nonprolif Allison R eye 12/2017-->referred to retinal specialist DM MANAGED BY Allison. Katreena Peters AS OF 2021   Endometrial cancer (HCC) 02/2018   REMISSION AS OF 08/2018.  Stage III (T3, Nx, Mx)  Path: endometroid adenocarcinoma involving cervix, uterus, and fallopian tubes (Pt got TAH & BSO 02/2018). No sign of recurrence as of onc f/u 05/2019.   GERD (gastroesophageal reflux disease)    Hemorrhoids    Herpes zoster 10/2015   L side belt-line    History of adenomatous polyp of colon 02/25/2013   5 mm cecal polyp removed by Allison. Alfrieda 5 yrs   History of blood transfusion    History of cellulitis 08/2010   Left (Since replacemnt of Left knee   History of radiation therapy 05/21/2018   HDR [facility] brachytherapy 04/24/2018-05/21/2018  [name]   Hyperkalemia 03/2018   started after pt started getting chemo-->had to stop enalapril .   Hyperlipidemia    Not on statin b/c lipids stayed down when sugars came down per pt.  She says [name] knows she is not on statin anymore.   Hypertension    Hypothyroidism    IDA (iron deficiency anemia)    GI ref 04/2023   Nephrolithiasis 07/2005   Obesity    OSA on CPAP    Osteoarthritis    knees, ankle, + ? scapholunate ligament disruption (x-ray 06/2017).  Progressive arthritis L wrist and hand 09/2022 radiographs   Pancytopenia due to antineoplastic chemotherapy [facility])    progressive as of 06/2018. Stable 08/2018.   Recurrent UTI    started cipro  250 qd 09/2018   Sciatica of left side    Severe persistent asthma    cough-variant--saw Allergist 05/25/16 and was switched from max dose advair to symbicort .   Systolic murmur    ECHO fine 10/2016-->murmur likely flow murmur assoc with HTN.   Zoon's vulvitis    Bx-proven (GYN) -lichen sclerosis.  Clobetasol  0.05% ointment per GYN   Past Surgical History:  Procedure Laterality Date   ANKLE FRACTURE SURGERY  1984   Pin & repair   ARTHROSCOPIC REPAIR ACL  2000   Due to ACL tear   ARTHROSCOPIC REPAIR ACL  08/2006   Blateral knee rerlacements x4 2 times each knee     BREAST BIOPSY Right 2014   Benign   CARDIAC CATHETERIZATION  08/2005   clear vessel mild mitral    CARPAL TUNNEL RELEASE Right [id number];8010   1989 Left   CESAREAN SECTION  1985   CHOLECYSTECTOMY OPEN  1988   COLONOSCOPY N/A 02/25/2013   Tubular adenoma x 1: Recall 5 yrs. Procedure: COLONOSCOPY;  Surgeon: Allison Sous, MD;  Location: [facility];  Service: Endoscopy;  Laterality:  N/A;   COLONOSCOPY     08/22/2023 (IDA) [facility]--> 1 polyp, +diverticulosis.   COMBINED HYSTEROSCOPY DIAGNOSTIC / D&C  05/2010   Bx neg   DEXA  08/2007; 05/12/20   Bone density normal 2009 and 2022.  Plan rpt 2024.   DILATION AND CURETTAGE OF UTERUS  03/2010   ESOPHAGOGASTRODUODENOSCOPY     08/22/2023 [facility] (IDA): antral erythema, nothing to explain bleeding   IR IMAGING GUIDED PORT INSERTION  03/16/2018   IR REMOVAL TUN ACCESS W/ PORT W/O FL MOD SED  09/20/2018   LYMPH NODE BIOPSY N/A 02/19/2018   Procedure: Sentinel LYMPH NODE BIOPSY;  Surgeon: Eloy Herring, MD;  Location: [facility];  Service: Gynecology;  Laterality: N/A;   REPLACEMENT TOTAL KNEE Left 08/2010   X 2 on each   ROBOTIC ASSISTED TOTAL HYSTERECTOMY WITH BILATERAL SALPINGO OOPHERECTOMY N/A 02/19/2018   For endometrial cancer.  Procedure: XI ROBOTIC ASSISTED TOTAL HYSTERECTOMY WITH BILATERAL SALPINGO OOPHORECTOMY;  Surgeon: Eloy Herring, MD;  Location: [facility];  Service: Gynecology;  Laterality: N/A;   TRANSTHORACIC ECHOCARDIOGRAM  10/11/2016    EF 55-60%, grd I DD.   TUBAL LIGATION  1985   C-Section   Social History   Socioeconomic History   Marital status: Married    Spouse name: Darina   Number of children: 2   Years of education: Not on file   Highest education level: Bachelor's degree (e.g., BA, AB, BS)  Occupational History   Occupation: retired Charity fundraiser  Tobacco Use   Smoking status: Never   Smokeless tobacco: Never  Vaping Use   Vaping status: Never Used  Substance and Sexual Activity   Alcohol use: No    Alcohol/week: 0.0 standard drinks of alcohol   Drug use: No   Sexual activity: Never    Partners: Male    Birth control/protection: I.U.D.  Other Topics Concern   Not on file  Social History Narrative   Not on file   Social Drivers of Health   Financial Resource Strain: Patient Declined (05/17/2023)   Overall Financial Resource Strain (CARDIA)    Difficulty of  Paying Living Expenses: Patient declined  Food Insecurity: Unknown (05/17/2023)   Hunger Vital Sign    Worried About Running Out of Food in the Last Year: Never true    Ran Out of Food in the Last Year: Patient declined  Transportation Needs: No Transportation Needs (05/17/2023)   PRAPARE - Administrator, Civil Service (Medical): No    Lack of Transportation (Non-Medical): No  Physical Activity: Unknown (05/17/2023)   Exercise Vital Sign    Days of Exercise per Week: Patient declined    Minutes of Exercise per Session: Not on file  Stress: No Stress Concern Present (05/17/2023)   [employer] of Occupational Health - Occupational Stress Questionnaire    Feeling of Stress : Only a little  Social Connections: Unknown (05/17/2023)   Social Connection and Isolation Panel    Frequency of Communication with Friends and Family: Twice a week    Frequency of Social Gatherings with Friends and Family: Once a week    Attends Religious Services: Patient declined    Database administrator or Organizations: Patient declined    Attends Banker Meetings: Not on file    Marital Status: Married  Intimate Partner Violence: Not At Risk (06/08/2022)   Humiliation, Afraid, Rape, and Kick questionnaire    Fear of Current or Ex-Partner: No    Emotionally Abused: No    Physically Abused: No    Sexually Abused: No   Current Outpatient Medications on File Prior to Visit  Medication Sig Dispense Refill   albuterol  (PROVENTIL ) (2.5 MG/3ML) 0.083% nebulizer solution Take 3 mLs (2.5 mg total) by nebulization every 4 (four) hours as needed for wheezing or shortness of breath (Dx: J45.50). 75 mL 1   albuterol  (VENTOLIN  HFA) 108 (90 Base) MCG/ACT inhaler INHALE 2 PUFFS INTO LUNGS EVERY 6 HOURS AS NEEDED FOR WHEEZING/SHORTNESS OF BREATH 18 each 1   amitriptyline  (ELAVIL )  25 MG tablet Take 2 tablets (50 mg total) by mouth at bedtime. 60 tablet 1   clobetasol  ointment (TEMOVATE ) 0.05 % APPLY 1  APPLICATION TOPICALLY AS NEEDED. APPLY TO THE SKIN OF THE VULVA FOR ITCHING 30 g 2   diclofenac  Sodium (VOLTAREN ) 1 % GEL Apply 2 g topically 4 (four) times daily. 100 g 3   felodipine  (PLENDIL ) 10 MG 24 hr tablet TAKE 1 TABLET BY MOUTH EVERY DAY 90 tablet 0   Ferrous Sulfate (IRON PO) Take by mouth.     fluticasone  (CUTIVATE ) 0.05 % cream APPLY TO AFFECTED AREA OF RIGHT LOWER LEG TWICE PER DAY. 60 g 1   fluticasone  (VERAMYST) 27.5 MCG/SPRAY nasal spray Place 1 spray into the nose daily as needed for rhinitis.     furosemide  (LASIX ) 20 MG tablet Take 1 tablet (20 mg total) by mouth daily. 90 tablet 1   HYDROcodone -acetaminophen  (NORCO) 10-325 MG tablet 1/2 - 1 tab po bid prn pain 45 tablet 0   Insulin  Pen Needle 32G X 4 MM MISC Use 3x a day 300 each 3   Insulin  Syringes, Disposable, U-100 0.3 ML MISC Use to inject insulin --6 injections per day 540 each 3   levocetirizine (XYZAL) 5 MG tablet Take 5 mg by mouth at bedtime as needed for allergies.   5   levothyroxine  (SYNTHROID ) 125 MCG tablet TAKE 1 TABLET (125 MCG TOTAL) BY MOUTH DAILY. OFFICE VISIT NEEDED FOR FURTHER REFILLS 90 tablet 0   lisinopril  (ZESTRIL ) 10 MG tablet TAKE 1 TABLET BY MOUTH EVERY DAY 90 tablet 0   metFORMIN  (GLUCOPHAGE ) 500 MG tablet TAKE 1 TABLET BY MOUTH EVERY MORNING AND 2 TABLETS BY MOUTH AT BEDTIME. STRENGTH: 500 MG 270 tablet 3   miconazole (MICOTIN) 2 % powder Apply 1 application topically 2 (two) times daily as needed for itching.     Mometasone  Furo-Formoterol  Fum (DULERA ) 50-5 MCG/ACT AERO TAKE 2 PUFFS BY MOUTH TWICE A DAY 13 g 3   NOVOLIN  N 100 UNIT/ML injection INJECT 30 UNITS TOTAL (0.3 ML) INTO THE SKIN 3 (THREE) TIMES DAILY BEFORE MEALS. 90 mL 1   omeprazole (PRILOSEC) 40 MG capsule Take 40 mg by mouth daily.     pravastatin  (PRAVACHOL ) 40 MG tablet TAKE 1 TABLET BY MOUTH EVERY DAY 90 tablet 1   Probiotic Product (PROBIOTIC PO) Take 1 capsule by mouth daily.     VITAMIN D PO Take by mouth.     No current  facility-administered medications on file prior to visit.   Allergies  Allergen Reactions   Adhesive [Tape] Other (See Comments)    Takes patient's skin off.   Banana Diarrhea and Nausea Only   Egg-Derived Products Diarrhea and Nausea Only   Latex Other (See Comments)    sensativity   Linzess [Linaclotide] Diarrhea   Ozempic  (0.25 Or 0.5 Mg-Dose) [Semaglutide (0.25 Or 0.5mg -Dos)] Diarrhea   Penicillins Rash and Other (See Comments)    Has had cephalosporins without incident   Pine Swelling and Rash   Rose Swelling and Rash   Family History  Problem Relation Age of Onset   Diabetes Father    COPD Father    Stroke Father    Celiac disease Brother    Congenital heart disease Brother    Rheum arthritis Brother    Thyroid  disease Brother    Diabetes Brother    Dementia Brother        Lewy Body   Alzheimer's disease Mother    Arthritis Mother  Other Son        Died age 64 -Tetrology of Fallot - VSD/pulmonary atresia   Breast cancer Other        postmenopausal when diagnosed   PE: BP 138/76   Pulse 100   Ht 5' 1 (1.549 m)   Wt 239 lb 9.6 oz (108.7 kg)   LMP 08/10/2010 Comment: Hysterectomy 02/02/18  SpO2 94%   BMI 45.27 kg/m  Wt Readings from Last 10 Encounters:  10/18/23 239 lb 9.6 oz (108.7 kg)  09/20/23 239 lb 12.8 oz (108.8 kg)  09/01/23 240 lb 6.4 oz (109 kg)  05/19/23 239 lb 6.4 oz (108.6 kg)  04/28/23 247 lb 9.6 oz (112.3 kg)  01/19/23 249 lb (112.9 kg)  01/18/23 249 lb (112.9 kg)  12/20/22 254 lb 6.4 oz (115.4 kg)  12/02/22 258 lb 3.2 oz (117.1 kg)  09/26/22 257 lb 12.8 oz (116.9 kg)   Constitutional: obese, in NAD, in motorized wheelchair Eyes: EOMI, no exophthalmos ENT: no thyromegaly, no cervical lymphadenopathy Cardiovascular: tachycardia, RR, +1 SEM, No RG, + bilateral LE pitting edema Respiratory: CTA B Musculoskeletal: no deformities Skin: bilateral periankle/shin erythema Neurological: + tremor with outstretched hands Diabetic Foot Exam -  Simple   Simple Foot Form Diabetic Foot exam was performed with the following findings: Yes 10/18/2023 11:14 AM  Visual Inspection No deformities, no ulcerations, no other skin breakdown bilaterally: Yes Sensation Testing See comments: Yes Pulse Check Posterior Tibialis and Dorsalis pulse intact bilaterally: Yes Comments Decreased sensation to monofilament R>L Ichtyosis B feet R>L B pitting edema     ASSESSMENT: 1. DM2, insulin -dependent, uncontrolled, with complications - mild NP Allison OD  2. HL  3.  Obesity class III  PLAN:  1. Patient with longstanding, controlled, type 2 diabetes, insulin -dependent, on metformin  and basal/bolus insulin  regimen with fairly good control.  At last visit, HbA1c was lower, at 6.4%.  She was on Fiasp  and NPH, but since last visit we had to change to NovoLog  per insurance preference.  She continues NPH. - At last visit, sugars appeared to be mostly fluctuating within the target range with only 1 significantly hyperglycemic peaks after lunch after forgetting to take the insulin  before the meal.  Sugars were patiently higher in the second half of the day but not consistently so we did not change the regimen at that time. CGM interpretation: -At today's visit, we reviewed her CGM downloads: It appears that 80% of values are in target range (goal >70%), while 12% are higher than 180 (goal <25%), and 8% are lower than 70 (goal <4%).  The calculated average blood sugar is 126.  The projected HbA1c for the next 3 months (GMI) is 6.3%. -Reviewing the CGM trends, sugars are fluctuating mainly within the target range but she has lows throughout the days and night. Sugars are higher after dinner, but still mostly within the target range.  She mentions that some of the higher blood sugars are due to overcorrection of the lows.  We discussed that whenever she corrects a low blood sugar, she needs to check with the glucometer, rather than the sensor, to see if the sugars have  improved.  However, at today's visit, I advised her to decrease all of her insulin  doses.  I strongly advised her to let me know if she has any more lows after this.  I refilled her NPH. - I suggested to:  Patient Instructions  Please continue: - Metformin  500 mg in am and 1000 mg with dinner  Please use the following regimen: Insulin  Before breakfast Before lunch Before dinner Bedtime  NovoLog  15 10 15  -  NPH 15 10 - 20   Please stop at the lab.  Please come back for a follow-up appointment in 4-6 months.  - we checked her HbA1c: 5.8% (lower) - advised to check sugars at different times of the day - 4x a day, rotating check times - advised for yearly eye exams >> she is not UTD - wil check an ACR todayl - return to clinic in 4-6 months  2. HL - Lipid panel was reviewed from 04/2023: LDL at goal, HDL low, triglycerides elevated: Lab Results  Component Value Date   CHOL 110 04/28/2023   HDL 28.70 (L) 04/28/2023   LDLCALC 45 04/28/2023   LDLDIRECT 78.0 10/05/2018   TRIG 185.0 (H) 04/28/2023   CHOLHDL 4 04/28/2023  - She is on pravastatin  40 mg daily without side effects  3.  Obesity class III - Unfortunately, we could not continue Ozempic  as this was not covered by her insurance.  This would help with weight loss. - She lost 12 pounds before last visit - she lost 10 lbs since last OV  Orders Placed This Encounter  Procedures   Microalbumin / creatinine urine ratio   POCT glycosylated hemoglobin (Hb A1C)   Lela Fendt, MD PhD [facility]

## 2023-10-18 NOTE — Patient Instructions (Addendum)
 Please continue: - Metformin  500 mg in am and 1000 mg with dinner  Please use the following regimen: Insulin  Before breakfast Before lunch Before dinner Bedtime  NovoLog  15 10 15  -  NPH 15 10 - 20   Please stop at the lab.  Please come back for a follow-up appointment in 4-6 months.

## 2023-10-19 ENCOUNTER — Ambulatory Visit: Payer: Self-pay | Admitting: Internal Medicine

## 2023-10-19 LAB — MICROALBUMIN / CREATININE URINE RATIO
Creatinine, Urine: 77 mg/dL (ref 20–275)
Microalb Creat Ratio: 882 mg/g{creat} — ABNORMAL HIGH (ref <30)
Microalb, Ur: 67.9 mg/dL

## 2023-10-19 MED ORDER — LISINOPRIL 10 MG PO TABS: 20.0000 mg | ORAL_TABLET | Freq: Every day | ORAL | Status: DC

## 2023-10-19 NOTE — Addendum Note (Signed)
 Addended by: TRIXIE FILE on: 10/19/2023 11:00 AM   Modules accepted: Orders

## 2023-10-28 ENCOUNTER — Other Ambulatory Visit: Payer: Self-pay | Admitting: Family Medicine

## 2023-10-31 ENCOUNTER — Other Ambulatory Visit: Payer: Self-pay | Admitting: Family Medicine

## 2023-11-14 ENCOUNTER — Other Ambulatory Visit: Payer: Self-pay | Admitting: Internal Medicine

## 2023-11-17 ENCOUNTER — Ambulatory Visit (INDEPENDENT_AMBULATORY_CARE_PROVIDER_SITE_OTHER): Admitting: Family Medicine

## 2023-11-17 ENCOUNTER — Encounter: Payer: Self-pay | Admitting: Family Medicine

## 2023-11-17 VITALS — BP 136/68 | HR 95 | Temp 97.9°F | Wt 236.0 lb

## 2023-11-17 DIAGNOSIS — R350 Frequency of micturition: Secondary | ICD-10-CM | POA: Diagnosis not present

## 2023-11-17 DIAGNOSIS — R5383 Other fatigue: Secondary | ICD-10-CM

## 2023-11-17 DIAGNOSIS — D509 Iron deficiency anemia, unspecified: Secondary | ICD-10-CM

## 2023-11-17 DIAGNOSIS — R809 Proteinuria, unspecified: Secondary | ICD-10-CM | POA: Diagnosis not present

## 2023-11-17 DIAGNOSIS — N1831 Chronic kidney disease, stage 3a: Secondary | ICD-10-CM | POA: Diagnosis not present

## 2023-11-17 LAB — POC URINALSYSI DIPSTICK (AUTOMATED)
Glucose, UA: NEGATIVE
Ketones, UA: NEGATIVE
Nitrite, UA: POSITIVE
Protein, UA: POSITIVE — AB
Spec Grav, UA: 1.01 (ref 1.010–1.025)
Urobilinogen, UA: 2 U/dL — AB
pH, UA: 5.5 (ref 5.0–8.0)

## 2023-11-17 MED ORDER — CEFDINIR 300 MG PO CAPS: 300.0000 mg | ORAL_CAPSULE | Freq: Two times a day (BID) | ORAL | 0 refills | Status: DC

## 2023-11-17 NOTE — Progress Notes (Signed)
 [name] , 01/21/1951, 73 y.o., female MRN: [id number] Patient Care Team    Relationship Specialty Notifications Start End  McGowen, Aleene DEL, MD PCP - General Family Medicine  09/19/14   Fleeta Smock, Lamar BROCKS, MD Consulting Physician Allergy and Immunology  05/28/16   Kristie Lamprey, MD Consulting Physician Gastroenterology  09/06/16   [place], Peter M, MD Consulting Physician Cardiology  09/29/16   Pinal Ronal RAMAN, MD Consulting Physician Gynecology  12/28/16   Anitra Freddy NOVAK, MD (Inactive) Consulting Physician Gynecologic Oncology  03/11/18   Eloy Herring, MD Consulting Physician Gynecologic Oncology  03/15/18   Lonn Hicks, MD Consulting Physician Hematology and Oncology  03/15/18   Trixie File, MD Consulting Physician Endocrinology  05/15/19     Chief Complaint  Patient presents with   Urinary Frequency    A few days. Abdominal pain, no control over bladder, dysuria. Pt has taken AZO.      Subjective: [name] is a 73 y.o. Pt presents for an OV with complaints of urinary symptoms of a few days duration.  Associated symptoms include burning with urination.  She did take Azo at 9 AM today.. Per EMR review Microalbumin creatinine ratio 10/18/2023 882 with endocrinology.  Patient is prescribed ACE inhibitor Increased edema reported. Significant anemia with a hemoglobin of 7.9 and hematocrit 24.9 in May 2025.  Unknown cause of her anemia she has had GI workup.  She has had chronic anemia with highest hemoglobin in 2022 at 11.5.  No repeat CBC noted since May.  She states she had an iron transfusion in April-arranged through digestive health specialist.   Iron 41, TIBC 215, percent saturation is 19, ferritin 478 after iron transfusion. Patient reports she is losing weight, has not had much of an appetite. She is established with endocrinology for her diabetes. She used to be established with hematology/oncology when she was going through her cancer treatments, she states she has  not seen them in quite a few years. No decrease in kidney function on the labs.  With a GFR of 42.2 and a creatinine 1.27     12/20/2022   10:06 AM 06/08/2022    1:42 PM 03/15/2022    1:36 PM 09/02/2021    1:29 PM 05/05/2021    1:01 PM  Depression screen PHQ 2/9  Decreased Interest 0 0 0 0 0  Down, Depressed, Hopeless 0 0 0 0 0  PHQ - 2 Score 0 0 0 0 0  Altered sleeping 1      Tired, decreased energy 1      Change in appetite 0      Feeling bad or failure about yourself  0      Trouble concentrating 0      Moving slowly or fidgety/restless 3      Suicidal thoughts 0      PHQ-9 Score 5      Difficult doing work/chores Somewhat difficult        Allergies  Allergen Reactions   Adhesive [Tape] Other (See Comments)    Takes patient's skin off.   Banana Diarrhea and Nausea Only   Egg-Derived Products Diarrhea and Nausea Only   Latex Other (See Comments)    sensativity   Linzess [Linaclotide] Diarrhea   Metformin  Diarrhea   Ozempic  (0.25 Or 0.5 Mg-Dose) [Semaglutide (0.25 Or 0.5mg -Dos)] Diarrhea   Penicillins Rash and Other (See Comments)    Has had cephalosporins without incident   Pine Swelling and Rash  Rose Swelling and Rash   Social History   Social History Narrative   Not on file   Past Medical History:  Diagnosis Date   Allergic rhinitis    Allergy testing: results pending as of 05/28/16 (Dr. Fleeta Smock)   Anemia 10/2020   anemia of chronic dz (? + mild IDA?--iron started   Arthritis    Chemotherapy-induced neuropathy (HCC) 2019   Cough variant asthma    DDD (degenerative disc disease), lumbar    Diabetes mellitus with complication (HCC)    Mild nonprolif DR R eye 12/2017-->referred to retinal specialist DM MANAGED BY DR. GHERGHE AS OF 2021   Endometrial cancer (HCC) 02/2018   REMISSION AS OF 08/2018.  Stage III (T3, Nx, Mx)  Path: endometroid adenocarcinoma involving cervix, uterus, and fallopian tubes (Pt got TAH & BSO 02/2018). No sign of recurrence as of onc f/u  05/2019.   Endometrial hyperplasia 07/02/2015   Treated with D&C and then Mirena IUD x 5 years.  IUD removed 3/17.     GERD (gastroesophageal reflux disease)    Hemorrhoids    Herpes zoster 10/2015   L side belt-line   History of adenomatous polyp of colon 02/25/2013   5 mm cecal polyp removed by Dr. Alfrieda 5 yrs   History of blood transfusion    History of cellulitis 08/2010   Left (Since replacemnt of Left knee   History of radiation therapy 05/21/2018   [facility] brachytherapy 04/24/2018-05/21/2018  Dr Lynwood Nasuti   Hyperkalemia 03/2018   started after pt started getting chemo-->had to stop enalapril .   Hyperlipidemia    Not on statin b/c lipids stayed down when sugars came down per pt.  She says Dr. Tommas knows she is not on statin anymore.   Hypertension    Hypothyroidism    IDA (iron deficiency anemia)    GI ref 04/2023   Nephrolithiasis 07/2005   Obesity    OSA on CPAP    Osteoarthritis    knees, ankle, + ? scapholunate ligament disruption (x-ray 06/2017).  Progressive arthritis L wrist and hand 09/2022 radiographs   Pancytopenia due to antineoplastic chemotherapy [facility])    progressive as of 06/2018. Stable 08/2018.   Recurrent UTI    started cipro  250 qd 09/2018   Sciatica of left side    Severe persistent asthma    cough-variant--saw Allergist 05/25/16 and was switched from max dose advair to symbicort .   Systolic murmur    ECHO fine 10/2016-->murmur likely flow murmur assoc with HTN.   Zoon's vulvitis    Bx-proven (GYN) -lichen sclerosis.  Clobetasol  0.05% ointment per GYN   Past Surgical History:  Procedure Laterality Date   ANKLE FRACTURE SURGERY  1984   Pin & repair   ARTHROSCOPIC REPAIR ACL  2000   Due to ACL tear   ARTHROSCOPIC REPAIR ACL  08/2006   Blateral knee rerlacements x4 2 times each knee     BREAST BIOPSY Right 2014   Benign   CARDIAC CATHETERIZATION  08/2005   clear vessel mild mitral    CARPAL TUNNEL RELEASE Right [id number]   1989 Left    CESAREAN SECTION  1985   CHOLECYSTECTOMY OPEN  1988   COLONOSCOPY N/A 02/25/2013   Tubular adenoma x 1: Recall 5 yrs. Procedure: COLONOSCOPY;  Surgeon: Renaye Sous, MD;  Location: [facility];  Service: Endoscopy;  Laterality: N/A;   COLONOSCOPY     08/22/2023 (IDA) [facility]--> 1 polyp, +diverticulosis.   COMBINED HYSTEROSCOPY DIAGNOSTIC /  D&C  05/2010   Bx neg   DEXA  08/2007; 05/12/20   Bone density normal 2009 and 2022.  Plan rpt 2024.   DILATION AND CURETTAGE OF UTERUS  03/2010   ESOPHAGOGASTRODUODENOSCOPY     08/22/2023 [facility] (IDA): antral erythema, nothing to explain bleeding   IR IMAGING GUIDED PORT INSERTION  03/16/2018   IR REMOVAL TUN ACCESS W/ PORT W/O FL MOD SED  09/20/2018   LYMPH NODE BIOPSY N/A 02/19/2018   Procedure: Sentinel LYMPH NODE BIOPSY;  Surgeon: Eloy Herring, MD;  Location: [facility];  Service: Gynecology;  Laterality: N/A;   REPLACEMENT TOTAL KNEE Left 08/2010   X 2 on each   ROBOTIC ASSISTED TOTAL HYSTERECTOMY WITH BILATERAL SALPINGO OOPHERECTOMY N/A 02/19/2018   For endometrial cancer.  Procedure: XI ROBOTIC ASSISTED TOTAL HYSTERECTOMY WITH BILATERAL SALPINGO OOPHORECTOMY;  Surgeon: Eloy Herring, MD;  Location: [facility] [place];  Service: Gynecology;  Laterality: N/A;   TRANSTHORACIC ECHOCARDIOGRAM  10/11/2016    EF 55-60%, grd I DD.   TUBAL LIGATION  1985   C-Section   Family History  Problem Relation Age of Onset   Diabetes Father    COPD Father    Stroke Father    Celiac disease Brother    Congenital heart disease Brother    Rheum arthritis Brother    Thyroid  disease Brother    Diabetes Brother    Dementia Brother        Lewy Body   Alzheimer's disease Mother    Arthritis Mother    Other Son        Died age 78 -Tetrology of Fallot - VSD/pulmonary atresia   Breast cancer Other        postmenopausal when diagnosed   Allergies as of 11/17/2023       Reactions   Adhesive [tape] Other (See Comments)   Takes  patient's skin off.   Banana Diarrhea, Nausea Only   Egg-derived Products Diarrhea, Nausea Only   Latex Other (See Comments)   sensativity   Linzess [linaclotide] Diarrhea   Metformin  Diarrhea   Ozempic  (0.25 Or 0.5 Mg-dose) [semaglutide (0.25 Or 0.5mg -dos)] Diarrhea   Penicillins Rash, Other (See Comments)   Has had cephalosporins without incident   Pine Swelling, Rash   Rose Swelling, Rash        Medication List        Accurate as of November 17, 2023  4:52 PM. If you have any questions, ask your nurse or doctor.          albuterol  (2.5 MG/3ML) 0.083% nebulizer solution Commonly known as: PROVENTIL  Take 3 mLs (2.5 mg total) by nebulization every 4 (four) hours as needed for wheezing or shortness of breath (Dx: J45.50).   albuterol  108 (90 Base) MCG/ACT inhaler Commonly known as: VENTOLIN  HFA INHALE 2 PUFFS INTO LUNGS EVERY 6 HOURS AS NEEDED FOR WHEEZING/SHORTNESS OF BREATH   amitriptyline  25 MG tablet Commonly known as: ELAVIL  Take 2 tablets (50 mg total) by mouth at bedtime.   cefdinir  300 MG capsule Commonly known as: OMNICEF  Take 1 capsule (300 mg total) by mouth 2 (two) times daily for 7 days. Started by: Chauna Osoria   clobetasol  ointment 0.05 % Commonly known as: TEMOVATE  APPLY 1 APPLICATION TOPICALLY AS NEEDED. APPLY TO THE SKIN OF THE VULVA FOR ITCHING   diclofenac  Sodium 1 % Gel Commonly known as: Voltaren  Apply 2 g topically 4 (four) times daily.   Dulera  50-5 MCG/ACT Aero Generic drug: Mometasone  Furo-Formoterol   Fum TAKE 2 PUFFS BY MOUTH TWICE A DAY   felodipine  10 MG 24 hr tablet Commonly known as: PLENDIL  TAKE 1 TABLET BY MOUTH EVERY DAY   fluticasone  0.05 % cream Commonly known as: CUTIVATE  APPLY TO AFFECTED AREA OF RIGHT LOWER LEG TWICE PER DAY.   fluticasone  27.5 MCG/SPRAY nasal spray Commonly known as: VERAMYST Place 1 spray into the nose daily as needed for rhinitis.   furosemide  20 MG tablet Commonly known as: LASIX  Take 1 tablet  (20 mg total) by mouth daily.   HYDROcodone -acetaminophen  10-325 MG tablet Commonly known as: NORCO 1/2 - 1 tab po bid prn pain   Insulin  Aspart FlexPen 100 UNIT/ML Commonly known as: NOVOLOG  NJECT 10-20 UNITS INTO THE SKIN 3 (THREE) 15 MINUTES BEFORE MEALS   insulin  NPH Human 100 UNIT/ML injection Commonly known as: NovoLIN  N Inject 45-50 units under skin daily as advised   Insulin  Pen Needle 32G X 4 MM Misc Use 3x a day   Insulin  Syringes (Disposable) U-100 0.3 ML Misc Use to inject insulin --6 injections per day   IRON PO Take by mouth.   levocetirizine 5 MG tablet Commonly known as: XYZAL Take 5 mg by mouth at bedtime as needed for allergies.   lisinopril  10 MG tablet Commonly known as: ZESTRIL  Take 2 tablets (20 mg total) by mouth daily.   metFORMIN  500 MG tablet Commonly known as: GLUCOPHAGE  TAKE 1 TABLET BY MOUTH EVERY MORNING AND 2 TABLETS BY MOUTH AT BEDTIME. STRENGTH: 500 MG   miconazole 2 % powder Commonly known as: MICOTIN Apply 1 application topically 2 (two) times daily as needed for itching.   omeprazole 40 MG capsule Commonly known as: PRILOSEC Take 40 mg by mouth daily.   pravastatin  40 MG tablet Commonly known as: PRAVACHOL  TAKE 1 TABLET BY MOUTH EVERY DAY   PROBIOTIC PO Take 1 capsule by mouth daily.   Synthroid  125 MCG tablet Generic drug: levothyroxine  TAKE 1 TABLET (125 MCG TOTAL) BY MOUTH DAILY. OFFICE VISIT NEEDED FOR FURTHER REFILLS   VITAMIN D PO Take by mouth.        All past medical history, surgical history, allergies, family history, immunizations andmedications were updated in the EMR today and reviewed under the history and medication portions of their EMR.     ROS Negative, with the exception of above mentioned in HPI   Objective:  BP 136/68   Pulse 95   Temp 97.9 F (36.6 C)   Wt 236 lb (107 kg)   LMP 08/10/2010 Comment: Hysterectomy 02/02/18  SpO2 96%   BMI 44.59 kg/m  Body mass index is 44.59  kg/m.  Physical Exam Vitals and nursing note reviewed.  Constitutional:      General: She is not in acute distress.    Appearance: Normal appearance. She is obese. She is not ill-appearing or toxic-appearing.  HENT:     Head: Normocephalic and atraumatic.  Eyes:     General: No scleral icterus.       Right eye: No discharge.        Left eye: No discharge.     Extraocular Movements: Extraocular movements intact.     Conjunctiva/sclera: Conjunctivae normal.     Pupils: Pupils are equal, round, and reactive to light.  Cardiovascular:     Rate and Rhythm: Normal rate and regular rhythm.     Heart sounds: Murmur heard.  Pulmonary:     Effort: Pulmonary effort is normal. No respiratory distress.     Breath sounds: Normal  breath sounds. No wheezing, rhonchi or rales.  Abdominal:     General: Abdomen is flat. Bowel sounds are normal. There is no distension.     Palpations: Abdomen is soft. There is no mass.     Tenderness: There is no abdominal tenderness. There is no right CVA tenderness, left CVA tenderness, guarding or rebound.     Hernia: No hernia is present.  Musculoskeletal:     Cervical back: Neck supple.     Right lower leg: Edema present.     Left lower leg: Edema present.     Comments: +2 edema bilaterally  Skin:    Findings: No rash.  Neurological:     Mental Status: She is alert and oriented to person, place, and time. Mental status is at baseline.     Motor: No weakness.     Coordination: Coordination normal.     Gait: Gait normal.  Psychiatric:        Mood and Affect: Mood normal.        Behavior: Behavior normal.        Thought Content: Thought content normal.        Judgment: Judgment normal.      No results found. No results found. Results for orders placed or performed in visit on 11/17/23 (from the past 24 hours)  POCT Urinalysis Dipstick (Automated)     Status: Abnormal   Collection Time: 11/17/23  2:34 PM  Result Value Ref Range   Color, UA orange     Clarity, UA clear    Glucose, UA Negative Negative   Bilirubin, UA 2+    Ketones, UA negative    Spec Grav, UA 1.010 1.010 - 1.025   Blood, UA +-    pH, UA 5.5 5.0 - 8.0   Protein, UA Positive (A) Negative   Urobilinogen, UA 2.0 (A) 0.2 or 1.0 E.U./dL   Nitrite, UA positive    Leukocytes, UA Large (3+) (A) Negative    Assessment/Plan: [name] is a 73 y.o. female present for OV for  Urinary frequency (Primary) - POCT Urinalysis Dipstick (Automated)-+3 leuks, positive blood, positive nitrates-possibly skewed by Azo recent dose. - Urinalysis w microscopic + reflex cultur pending Elected to start Omnicef  twice daily x 7 days while waiting on urine culture. - C3 and C4 - ANA, IFA Comprehensive Panel-([facility]) - ANCA Screen Reflex Titer - Glomerular Membrane Antibodies - Iron, TIBC and Ferritin Panel - CBC w/Diff - Comp Met (CMET)  Iron deficiency anemia, unspecified iron deficiency anemia type/CKD 3 A/albuminuria/fatigue Patient's hemoglobin has been significantly low, she would benefit from being monitored by hematology for what seems like she will be needing more frequent either iron or blood transfusions. Referred her to nephrology and started kidney disease workup.  Albuminuria significant at 883, with anemia, edema and new decrease in kidney function .  Labs to rule out nephrotic syndrome - C3 and C4 - ANA, IFA Comprehensive Panel-([facility]) - ANCA Screen Reflex Titer - Glomerular Membrane Antibodies - Iron, TIBC and Ferritin Panel - Ambulatory referral to Nephrology - Ambulatory referral to Hematology / Oncology - CBC w/Diff - Comp Met (CMET)   Reviewed expectations re: course of current medical issues. Discussed self-management of symptoms. Outlined signs and symptoms indicating need for more acute intervention. Patient verbalized understanding and all questions were answered. Patient received an After-Visit Summary.  45 minutes spent in patient encounter  reviewing EMR for multiple specialty teams notes, prior labs and working up UTI and possible nephrotic  syndrome.  Orders Placed This Encounter  Procedures   Urinalysis w microscopic + reflex cultur   C3 and C4   ANA, IFA Comprehensive Panel-([facility])   ANCA Screen Reflex Titer   Glomerular Membrane Antibodies   Iron, TIBC and Ferritin Panel   CBC w/Diff   Comp Met (CMET)   Ambulatory referral to Nephrology   Ambulatory referral to Hematology / Oncology   POCT Urinalysis Dipstick (Automated)   Meds ordered this encounter  Medications   cefdinir  (OMNICEF ) 300 MG capsule    Sig: Take 1 capsule (300 mg total) by mouth 2 (two) times daily for 7 days.    Dispense:  14 capsule    Refill:  0   Referral Orders         Ambulatory referral to Nephrology         Ambulatory referral to Hematology / Oncology       Note is dictated utilizing voice recognition software. Although note has been proof read prior to signing, occasional typographical errors still can be missed. If any questions arise, please do not hesitate to call for verification.   electronically signed by:  Charlies Bellini, DO  [facility] - [place]

## 2023-11-17 NOTE — Patient Instructions (Signed)
 We will let you know results as we get them. We will discuss when to follow up after all labs received         Great to see you today.  I have refilled the medication(s) we provide.   If labs were collected or images ordered, we will inform you of  results once we have received them and reviewed. We will contact you either by echart message, or telephone call.  Please give ample time to the testing facility, and our office to run,  receive and review results. Please do not call inquiring of results, even if you can see them in your chart. We will contact you as soon as we are able. If it has been over 1 week since the test was completed, and you have not yet heard from us , then please call us .    - echart message- for normal results that have been seen by the patient already.   - telephone call: abnormal results or if patient has not viewed results in their echart.  If a referral to a specialist was entered for you, please call us  in 2 weeks if you have not heard from the specialist office to schedule.

## 2023-11-20 ENCOUNTER — Other Ambulatory Visit: Payer: Self-pay

## 2023-11-20 ENCOUNTER — Emergency Department (HOSPITAL_BASED_OUTPATIENT_CLINIC_OR_DEPARTMENT_OTHER)

## 2023-11-20 ENCOUNTER — Encounter (HOSPITAL_BASED_OUTPATIENT_CLINIC_OR_DEPARTMENT_OTHER): Payer: Self-pay | Admitting: Emergency Medicine

## 2023-11-20 ENCOUNTER — Inpatient Hospital Stay (HOSPITAL_BASED_OUTPATIENT_CLINIC_OR_DEPARTMENT_OTHER)
Admission: EM | Admit: 2023-11-20 | Discharge: 2023-11-23 | DRG: 811 | Disposition: A | Discharge status: Home Health | Attending: Internal Medicine | Admitting: Internal Medicine

## 2023-11-20 ENCOUNTER — Ambulatory Visit: Payer: Self-pay | Admitting: Family Medicine

## 2023-11-20 DIAGNOSIS — Z8249 Family history of ischemic heart disease and other diseases of the circulatory system: Secondary | ICD-10-CM | POA: Diagnosis not present

## 2023-11-20 DIAGNOSIS — Z820 Family history of epilepsy and other diseases of the nervous system: Secondary | ICD-10-CM

## 2023-11-20 DIAGNOSIS — K5730 Diverticulosis of large intestine without perforation or abscess without bleeding: Secondary | ICD-10-CM | POA: Diagnosis not present

## 2023-11-20 DIAGNOSIS — I10 Essential (primary) hypertension: Secondary | ICD-10-CM | POA: Diagnosis present

## 2023-11-20 DIAGNOSIS — Z91040 Latex allergy status: Secondary | ICD-10-CM

## 2023-11-20 DIAGNOSIS — E039 Hypothyroidism, unspecified: Secondary | ICD-10-CM | POA: Diagnosis present

## 2023-11-20 DIAGNOSIS — E785 Hyperlipidemia, unspecified: Secondary | ICD-10-CM | POA: Diagnosis present

## 2023-11-20 DIAGNOSIS — C541 Malignant neoplasm of endometrium: Secondary | ICD-10-CM | POA: Diagnosis present

## 2023-11-20 DIAGNOSIS — I5033 Acute on chronic diastolic (congestive) heart failure: Secondary | ICD-10-CM | POA: Diagnosis not present

## 2023-11-20 DIAGNOSIS — I130 Hypertensive heart and chronic kidney disease with heart failure and stage 1 through stage 4 chronic kidney disease, or unspecified chronic kidney disease: Secondary | ICD-10-CM | POA: Diagnosis present

## 2023-11-20 DIAGNOSIS — E1122 Type 2 diabetes mellitus with diabetic chronic kidney disease: Secondary | ICD-10-CM | POA: Diagnosis present

## 2023-11-20 DIAGNOSIS — Z8379 Family history of other diseases of the digestive system: Secondary | ICD-10-CM

## 2023-11-20 DIAGNOSIS — E119 Type 2 diabetes mellitus without complications: Secondary | ICD-10-CM

## 2023-11-20 DIAGNOSIS — K648 Other hemorrhoids: Secondary | ICD-10-CM | POA: Diagnosis present

## 2023-11-20 DIAGNOSIS — R590 Localized enlarged lymph nodes: Secondary | ICD-10-CM | POA: Diagnosis not present

## 2023-11-20 DIAGNOSIS — D631 Anemia in chronic kidney disease: Secondary | ICD-10-CM | POA: Diagnosis present

## 2023-11-20 DIAGNOSIS — Z825 Family history of asthma and other chronic lower respiratory diseases: Secondary | ICD-10-CM

## 2023-11-20 DIAGNOSIS — I2489 Other forms of acute ischemic heart disease: Secondary | ICD-10-CM | POA: Diagnosis present

## 2023-11-20 DIAGNOSIS — Z6841 Body Mass Index (BMI) 40.0 and over, adult: Secondary | ICD-10-CM

## 2023-11-20 DIAGNOSIS — Z79899 Other long term (current) drug therapy: Secondary | ICD-10-CM

## 2023-11-20 DIAGNOSIS — D509 Iron deficiency anemia, unspecified: Principal | ICD-10-CM | POA: Diagnosis present

## 2023-11-20 DIAGNOSIS — R0609 Other forms of dyspnea: Secondary | ICD-10-CM

## 2023-11-20 DIAGNOSIS — D649 Anemia, unspecified: Principal | ICD-10-CM | POA: Diagnosis present

## 2023-11-20 DIAGNOSIS — B9620 Unspecified Escherichia coli [E. coli] as the cause of diseases classified elsewhere: Secondary | ICD-10-CM | POA: Diagnosis present

## 2023-11-20 DIAGNOSIS — N3090 Cystitis, unspecified without hematuria: Secondary | ICD-10-CM | POA: Diagnosis not present

## 2023-11-20 DIAGNOSIS — E66813 Obesity, class 3: Secondary | ICD-10-CM | POA: Diagnosis present

## 2023-11-20 DIAGNOSIS — Z91199 Patient's noncompliance with other medical treatment and regimen due to unspecified reason: Secondary | ICD-10-CM

## 2023-11-20 DIAGNOSIS — R918 Other nonspecific abnormal finding of lung field: Secondary | ICD-10-CM | POA: Diagnosis not present

## 2023-11-20 DIAGNOSIS — J811 Chronic pulmonary edema: Secondary | ICD-10-CM | POA: Diagnosis not present

## 2023-11-20 DIAGNOSIS — K5750 Diverticulosis of both small and large intestine without perforation or abscess without bleeding: Secondary | ICD-10-CM | POA: Diagnosis present

## 2023-11-20 DIAGNOSIS — Z7984 Long term (current) use of oral hypoglycemic drugs: Secondary | ICD-10-CM

## 2023-11-20 DIAGNOSIS — Z8542 Personal history of malignant neoplasm of other parts of uterus: Secondary | ICD-10-CM | POA: Diagnosis not present

## 2023-11-20 DIAGNOSIS — Z91048 Other nonmedicinal substance allergy status: Secondary | ICD-10-CM

## 2023-11-20 DIAGNOSIS — Z794 Long term (current) use of insulin: Secondary | ICD-10-CM

## 2023-11-20 DIAGNOSIS — N1831 Chronic kidney disease, stage 3a: Secondary | ICD-10-CM | POA: Diagnosis present

## 2023-11-20 DIAGNOSIS — J45991 Cough variant asthma: Secondary | ICD-10-CM | POA: Diagnosis not present

## 2023-11-20 DIAGNOSIS — Z823 Family history of stroke: Secondary | ICD-10-CM

## 2023-11-20 DIAGNOSIS — Z96652 Presence of left artificial knee joint: Secondary | ICD-10-CM | POA: Diagnosis present

## 2023-11-20 DIAGNOSIS — K2970 Gastritis, unspecified, without bleeding: Secondary | ICD-10-CM | POA: Diagnosis present

## 2023-11-20 DIAGNOSIS — G894 Chronic pain syndrome: Secondary | ICD-10-CM

## 2023-11-20 DIAGNOSIS — Z8541 Personal history of malignant neoplasm of cervix uteri: Secondary | ICD-10-CM

## 2023-11-20 DIAGNOSIS — J4550 Severe persistent asthma, uncomplicated: Secondary | ICD-10-CM | POA: Diagnosis present

## 2023-11-20 DIAGNOSIS — Z79890 Hormone replacement therapy (postmenopausal): Secondary | ICD-10-CM

## 2023-11-20 DIAGNOSIS — K644 Residual hemorrhoidal skin tags: Secondary | ICD-10-CM | POA: Diagnosis present

## 2023-11-20 DIAGNOSIS — Z880 Allergy status to penicillin: Secondary | ICD-10-CM

## 2023-11-20 DIAGNOSIS — I509 Heart failure, unspecified: Secondary | ICD-10-CM

## 2023-11-20 DIAGNOSIS — G8929 Other chronic pain: Secondary | ICD-10-CM | POA: Diagnosis present

## 2023-11-20 DIAGNOSIS — Z833 Family history of diabetes mellitus: Secondary | ICD-10-CM

## 2023-11-20 DIAGNOSIS — I110 Hypertensive heart disease with heart failure: Secondary | ICD-10-CM | POA: Diagnosis not present

## 2023-11-20 DIAGNOSIS — K219 Gastro-esophageal reflux disease without esophagitis: Secondary | ICD-10-CM | POA: Diagnosis present

## 2023-11-20 DIAGNOSIS — J849 Interstitial pulmonary disease, unspecified: Secondary | ICD-10-CM | POA: Diagnosis not present

## 2023-11-20 DIAGNOSIS — Z923 Personal history of irradiation: Secondary | ICD-10-CM

## 2023-11-20 DIAGNOSIS — G4733 Obstructive sleep apnea (adult) (pediatric): Secondary | ICD-10-CM | POA: Diagnosis not present

## 2023-11-20 DIAGNOSIS — M19032 Primary osteoarthritis, left wrist: Secondary | ICD-10-CM | POA: Diagnosis present

## 2023-11-20 DIAGNOSIS — R0602 Shortness of breath: Secondary | ICD-10-CM | POA: Diagnosis not present

## 2023-11-20 DIAGNOSIS — Z9221 Personal history of antineoplastic chemotherapy: Secondary | ICD-10-CM

## 2023-11-20 DIAGNOSIS — Z888 Allergy status to other drugs, medicaments and biological substances status: Secondary | ICD-10-CM

## 2023-11-20 DIAGNOSIS — Z9109 Other allergy status, other than to drugs and biological substances: Secondary | ICD-10-CM

## 2023-11-20 DIAGNOSIS — Z8349 Family history of other endocrine, nutritional and metabolic diseases: Secondary | ICD-10-CM

## 2023-11-20 DIAGNOSIS — Z7951 Long term (current) use of inhaled steroids: Secondary | ICD-10-CM | POA: Diagnosis not present

## 2023-11-20 DIAGNOSIS — Z90722 Acquired absence of ovaries, bilateral: Secondary | ICD-10-CM

## 2023-11-20 DIAGNOSIS — Z91012 Allergy to eggs: Secondary | ICD-10-CM

## 2023-11-20 DIAGNOSIS — Z90710 Acquired absence of both cervix and uterus: Secondary | ICD-10-CM

## 2023-11-20 DIAGNOSIS — Z8261 Family history of arthritis: Secondary | ICD-10-CM

## 2023-11-20 DIAGNOSIS — Z860101 Personal history of adenomatous and serrated colon polyps: Secondary | ICD-10-CM

## 2023-11-20 DIAGNOSIS — Z803 Family history of malignant neoplasm of breast: Secondary | ICD-10-CM

## 2023-11-20 LAB — URINALYSIS W MICROSCOPIC + REFLEX CULTURE
Bilirubin Urine: NEGATIVE
Glucose, UA: NEGATIVE
Hyaline Cast: NONE SEEN /LPF
Ketones, ur: NEGATIVE
Nitrites, Initial: POSITIVE — AB
Specific Gravity, Urine: 1.012 (ref 1.001–1.035)
WBC, UA: >=60 /HPF — AB (ref 0–5)
pH: <=5 — AB (ref 5.0–8.0)

## 2023-11-20 LAB — RETICULOCYTES
Immature Retic Fract: 27.2 % — ABNORMAL HIGH (ref 2.3–15.9)
RBC.: 2.46 MIL/uL — ABNORMAL LOW (ref 3.87–5.11)
Retic Count, Absolute: 62.2 K/uL (ref 19.0–186.0)
Retic Ct Pct: 2.5 % (ref 0.4–3.1)

## 2023-11-20 LAB — CBC WITH DIFFERENTIAL/PLATELET
Abs Granulocyte: 5.8 K/uL (ref 1.5–6.5)
Abs Immature Granulocytes: 0.13 K/uL — ABNORMAL HIGH (ref 0.00–0.07)
Basophils Absolute: 0 K/uL (ref 0.0–0.1)
Basophils Relative: 1 %
Eosinophils Absolute: 0.6 K/uL — ABNORMAL HIGH (ref 0.0–0.5)
Eosinophils Relative: 7 %
HCT: 22.9 % — ABNORMAL LOW (ref 36.0–46.0)
Hemoglobin: 6.7 g/dL — CL (ref 12.0–15.0)
Immature Granulocytes: 2 %
Lymphocytes Relative: 14 %
Lymphs Abs: 1.2 K/uL (ref 0.7–4.0)
MCH: 27.6 pg (ref 26.0–34.0)
MCHC: 29.3 g/dL — ABNORMAL LOW (ref 30.0–36.0)
MCV: 94.2 fL (ref 80.0–100.0)
Monocytes Absolute: 0.7 K/uL (ref 0.1–1.0)
Monocytes Relative: 8 %
Neutro Abs: 5.8 K/uL (ref 1.7–7.7)
Neutrophils Relative %: 68 %
Platelets: 337 K/uL (ref 150–400)
RBC: 2.43 MIL/uL — ABNORMAL LOW (ref 3.87–5.11)
RDW: 16.9 % — ABNORMAL HIGH (ref 11.5–15.5)
Smear Review: NORMAL
WBC: 8.4 K/uL (ref 4.0–10.5)
nRBC: 0 % (ref 0.0–0.2)

## 2023-11-20 LAB — COMPREHENSIVE METABOLIC PANEL WITH GFR
ALT: 6 U/L (ref 0–44)
AST: 17 U/L (ref 15–41)
Albumin: 3.2 g/dL — ABNORMAL LOW (ref 3.5–5.0)
Alkaline Phosphatase: 70 U/L (ref 38–126)
Anion gap: 14 (ref 5–15)
BUN: 31 mg/dL — ABNORMAL HIGH (ref 8–23)
CO2: 20 mmol/L — ABNORMAL LOW (ref 22–32)
Calcium: 8.7 mg/dL — ABNORMAL LOW (ref 8.9–10.3)
Chloride: 105 mmol/L (ref 98–111)
Creatinine, Ser: 1.44 mg/dL — ABNORMAL HIGH (ref 0.44–1.00)
GFR, Estimated: 38 mL/min — ABNORMAL LOW (ref >60)
Glucose, Bld: 187 mg/dL — ABNORMAL HIGH (ref 70–99)
Potassium: 4.3 mmol/L (ref 3.5–5.1)
Sodium: 139 mmol/L (ref 135–145)
Total Bilirubin: 0.2 mg/dL (ref 0.0–1.2)
Total Protein: 7.2 g/dL (ref 6.5–8.1)

## 2023-11-20 LAB — URINE CULTURE
MICRO NUMBER:: 16810931
SPECIMEN QUALITY:: ADEQUATE

## 2023-11-20 LAB — IRON AND TIBC
Iron: 35 ug/dL (ref 28–170)
Saturation Ratios: 15 % (ref 10.4–31.8)
TIBC: 235 ug/dL — ABNORMAL LOW (ref 250–450)
UIBC: 200 ug/dL

## 2023-11-20 LAB — FOLATE: Folate: 11.5 ng/mL (ref >5.9)

## 2023-11-20 LAB — TROPONIN T, HIGH SENSITIVITY: Troponin T High Sensitivity: 21 ng/L — ABNORMAL HIGH (ref <19)

## 2023-11-20 LAB — PROTIME-INR
INR: 1 (ref 0.8–1.2)
Prothrombin Time: 14.3 s (ref 11.4–15.2)

## 2023-11-20 LAB — FERRITIN: Ferritin: 446 ng/mL — ABNORMAL HIGH (ref 11–307)

## 2023-11-20 LAB — PRO BRAIN NATRIURETIC PEPTIDE: Pro Brain Natriuretic Peptide: 983 pg/mL — ABNORMAL HIGH (ref <300.0)

## 2023-11-20 LAB — VITAMIN B12: Vitamin B-12: 319 pg/mL (ref 180–914)

## 2023-11-20 LAB — CULTURE INDICATED

## 2023-11-20 LAB — OCCULT BLOOD X 1 CARD TO LAB, STOOL: Fecal Occult Bld: NEGATIVE

## 2023-11-20 MED ORDER — IOHEXOL 350 MG/ML SOLN: 75.0000 mL | Freq: Once | INTRAVENOUS | Status: DC | PRN

## 2023-11-20 MED ORDER — FUROSEMIDE 10 MG/ML IJ SOLN
40.0000 mg | Freq: Once | INTRAMUSCULAR | Status: AC
  Administered 2023-11-20 (×2): 40 mg via INTRAVENOUS
  Filled 2023-11-20: qty 4

## 2023-11-20 MED ORDER — IOHEXOL 300 MG/ML  SOLN
75.0000 mL | Freq: Once | INTRAMUSCULAR | Status: AC | PRN
  Administered 2023-11-20 (×2): 75 mL via INTRAVENOUS

## 2023-11-20 NOTE — ED Provider Notes (Signed)
 [place] [facility] AT [facility] Provider Note  CSN: [id number] Arrival date & time: 11/20/23 1500  Chief Complaint(s) Abnormal Lab and Shortness of Breath  HPI [name] is a 73 y.o. female with past medical history as below, significant for DM, endometrial cancer, GERD, hemorrhoids hyperlipidemia, hypertension, iron  deficiency anemia, OSA on CPAP who presents to the ED with complaint of abnormal labs, exertional dyspnea  Patient was seen in her PCPs office on 8/8 secondary to concern of UTI.  She had screening labs obtained at that time which were revealing for anemia with hemoglobin 6.7, patient was directed to come to the ER by her PCP.  Hemoglobin typically between 7.5 and 10 per chart review.  Patient also reports worsening swelling to her lower extremities, variable compliance with diuretic.  She has exertional dyspnea and orthopnea which is mildly worse than baseline.  She reports chills, exercise intolerance.  No chest pain, no fevers, no nausea or vomiting, no abdominal pain.  Reports iron  transfusion in the last month or so, no reaction with blood transfusions in the past.  She is compliant with her oral iron  tablets.  Patient exertional dyspnea, orthopnea, worsening swelling to her legs, variable compliance with her diuretic  Past Medical History Past Medical History:  Diagnosis Date   Allergic rhinitis    Allergy testing: results pending as of 05/28/16 (Dr. Fleeta Smock)   Anemia 10/2020   anemia of chronic dz (? + mild IDA?--iron  started   Arthritis    Chemotherapy-induced neuropathy (HCC) 2019   Cough variant asthma    DDD (degenerative disc disease), lumbar    Diabetes mellitus with complication (HCC)    Mild nonprolif DR R eye 12/2017-->referred to retinal specialist DM MANAGED BY DR. GHERGHE AS OF 2021   Endometrial cancer (HCC) 02/2018   REMISSION AS OF 08/2018.  Stage III (T3, Nx, Mx)  Path: endometroid adenocarcinoma involving cervix, uterus,  and fallopian tubes (Pt got TAH & BSO 02/2018). No sign of recurrence as of onc f/u 05/2019.   Endometrial hyperplasia 07/02/2015   Treated with D&C and then Mirena IUD x 5 years.  IUD removed 3/17.     GERD (gastroesophageal reflux disease)    Hemorrhoids    Herpes zoster 10/2015   L side belt-line   History of adenomatous polyp of colon 02/25/2013   5 mm cecal polyp removed by Dr. Alfrieda 5 yrs   History of blood transfusion    History of cellulitis 08/2010   Left (Since replacemnt of Left knee   History of radiation therapy 05/21/2018   [facility] brachytherapy 04/24/2018-05/21/2018  Dr Lynwood Nasuti   Hyperkalemia 03/2018   started after pt started getting chemo-->had to stop enalapril .   Hyperlipidemia    Not on statin b/c lipids stayed down when sugars came down per pt.  She says Dr. Tommas knows she is not on statin anymore.   Hypertension    Hypothyroidism    IDA (iron  deficiency anemia)    GI ref 04/2023   Nephrolithiasis 07/2005   Obesity    OSA on CPAP    Osteoarthritis    knees, ankle, + ? scapholunate ligament disruption (x-ray 06/2017).  Progressive arthritis L wrist and hand 09/2022 radiographs   Pancytopenia due to antineoplastic chemotherapy [facility])    progressive as of 06/2018. Stable 08/2018.   Recurrent UTI    started cipro  250 qd 09/2018   Sciatica of left side    Severe persistent asthma  cough-variant--saw Allergist 05/25/16 and was switched from max dose advair to symbicort .   Systolic murmur    ECHO fine 10/2016-->murmur likely flow murmur assoc with HTN.   Zoon's vulvitis    Bx-proven (GYN) -lichen sclerosis.  Clobetasol  0.05% ointment per GYN   Patient Active Problem List   Diagnosis Date Noted   Symptomatic anemia 11/20/2023   Iron  deficiency anemia 11/17/2023   Chronic kidney disease, stage 3a (HCC) 11/17/2023   Albuminuria 11/17/2023   Nuclear sclerotic cataract of right eye 02/08/2021   History of colonic polyps 06/30/2020   Dysphagia 06/30/2020    Type 2 diabetes, uncontrolled, with retinopathy 05/07/2019   Pancytopenia, acquired (HCC) 04/30/2018   Bilateral edema of lower extremity 04/09/2018   Peripheral neuropathy due to chemotherapy (HCC) 04/09/2018   Endometrial cancer (HCC) 02/19/2018   Morbid obesity (HCC) 02/19/2018   Chronic pain 11/03/2017   Scapholunate ligament injury, no instability 06/29/2017   HTN (hypertension) 09/27/2016   Dyspnea 09/27/2016   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   Hypothyroidism 07/10/2015   OSA (obstructive sleep apnea) 07/10/2015   Cough variant asthma 11/02/2014   Home Medication(s) Prior to Admission medications   Medication Sig Start Date End Date Taking? Authorizing Provider  albuterol  (PROVENTIL ) (2.5 MG/3ML) 0.083% nebulizer solution Take 3 mLs (2.5 mg total) by nebulization every 4 (four) hours as needed for wheezing or shortness of breath (Dx: J45.50). 07/04/17   McGowen, Aleene DEL, MD  albuterol  (VENTOLIN  HFA) 108 (90 Base) MCG/ACT inhaler INHALE 2 PUFFS INTO LUNGS EVERY 6 HOURS AS NEEDED FOR WHEEZING/SHORTNESS OF BREATH 09/08/23   McGowen, Aleene DEL, MD  amitriptyline  (ELAVIL ) 25 MG tablet Take 2 tablets (50 mg total) by mouth at bedtime. 10/09/23   McGowen, Aleene DEL, MD  cefdinir  (OMNICEF ) 300 MG capsule Take 1 capsule (300 mg total) by mouth 2 (two) times daily for 7 days. 11/17/23 11/24/23  Kuneff, Renee A, DO  clobetasol  ointment (TEMOVATE ) 0.05 % APPLY 1 APPLICATION TOPICALLY AS NEEDED. APPLY TO THE SKIN OF THE VULVA FOR ITCHING 01/17/23   Cross, Melissa D, NP  diclofenac  Sodium (VOLTAREN ) 1 % GEL Apply 2 g topically 4 (four) times daily. 07/19/19   McGowen, Aleene DEL, MD  felodipine  (PLENDIL ) 10 MG 24 hr tablet TAKE 1 TABLET BY MOUTH EVERY DAY 09/26/23   McGowen, Aleene DEL, MD  Ferrous Sulfate (IRON  PO) Take by mouth.    [provider]  fluticasone  (CUTIVATE ) 0.05 % cream APPLY TO AFFECTED AREA OF RIGHT LOWER LEG TWICE PER DAY. 02/28/22   McGowen, Aleene DEL, MD  fluticasone  (VERAMYST) 27.5  MCG/SPRAY nasal spray Place 1 spray into the nose daily as needed for rhinitis.    [provider]  furosemide  (LASIX ) 20 MG tablet Take 1 tablet (20 mg total) by mouth daily. 01/06/23   McGowen, Aleene DEL, MD  HYDROcodone -acetaminophen  (NORCO) 10-325 MG tablet 1/2 - 1 tab po bid prn pain 09/25/23   McGowen, Aleene DEL, MD  Insulin  Aspart FlexPen (NOVOLOG ) 100 UNIT/ML NJECT 10-20 UNITS INTO THE SKIN 3 (THREE) 15 MINUTES BEFORE MEALS 11/14/23   Trixie File, MD  insulin  NPH Human (NOVOLIN  N) 100 UNIT/ML injection Inject 45-50 units under skin daily as advised 10/18/23   Trixie File, MD  Insulin  Pen Needle 32G X 4 MM MISC Use 3x a day 01/18/23   Trixie File, MD  Insulin  Syringes, Disposable, U-100 0.3 ML MISC Use to inject insulin --6 injections per day 02/05/18   McGowen, Aleene DEL, MD  levocetirizine (XYZAL) 5 MG tablet Take  5 mg by mouth at bedtime as needed for allergies.  01/30/17   [provider]  levothyroxine  (SYNTHROID ) 125 MCG tablet TAKE 1 TABLET (125 MCG TOTAL) BY MOUTH DAILY. OFFICE VISIT NEEDED FOR FURTHER REFILLS 09/28/23   McGowen, Aleene DEL, MD  lisinopril  (ZESTRIL ) 10 MG tablet Take 2 tablets (20 mg total) by mouth daily. 10/19/23   Trixie File, MD  metFORMIN  (GLUCOPHAGE ) 500 MG tablet TAKE 1 TABLET BY MOUTH EVERY MORNING AND 2 TABLETS BY MOUTH AT BEDTIME. STRENGTH: 500 MG 07/04/23   Trixie File, MD  miconazole (MICOTIN) 2 % powder Apply 1 application topically 2 (two) times daily as needed for itching.    [provider]  Mometasone  Furo-Formoterol  Fum (DULERA ) 50-5 MCG/ACT AERO TAKE 2 PUFFS BY MOUTH TWICE A DAY 10/06/23   McGowen, Aleene DEL, MD  omeprazole (PRILOSEC) 40 MG capsule Take 40 mg by mouth daily.    [provider]  pravastatin  (PRAVACHOL ) 40 MG tablet TAKE 1 TABLET BY MOUTH EVERY DAY 07/04/23   McGowen, Aleene DEL, MD  Probiotic Product (PROBIOTIC PO) Take 1 capsule by mouth daily.    [provider]  VITAMIN D PO  Take by mouth.    [provider]                                                                                                                                    Past Surgical History Past Surgical History:  Procedure Laterality Date   ANKLE FRACTURE SURGERY  1984   Pin & repair   ARTHROSCOPIC REPAIR ACL  2000   Due to ACL tear   ARTHROSCOPIC REPAIR ACL  08/2006   Blateral knee rerlacements x4 2 times each knee     BREAST BIOPSY Right 2014   Benign   CARDIAC CATHETERIZATION  08/2005   clear vessel mild mitral    CARPAL TUNNEL RELEASE Right [id number]   1989 Left   CESAREAN SECTION  1985   CHOLECYSTECTOMY OPEN  1988   COLONOSCOPY N/A 02/25/2013   Tubular adenoma x 1: Recall 5 yrs. Procedure: COLONOSCOPY;  Surgeon: Renaye Sous, MD;  Location: [facility];  Service: Endoscopy;  Laterality: N/A;   COLONOSCOPY     08/22/2023 (IDA) [facility]--> 1 polyp, +diverticulosis.   COMBINED HYSTEROSCOPY DIAGNOSTIC / D&C  05/2010   Bx neg   DEXA  08/2007; 05/12/20   Bone density normal 2009 and 2022.  Plan rpt 2024.   DILATION AND CURETTAGE OF UTERUS  03/2010   ESOPHAGOGASTRODUODENOSCOPY     08/22/2023 [facility] (IDA): antral erythema, nothing to explain bleeding   IR IMAGING GUIDED PORT INSERTION  03/16/2018   IR REMOVAL TUN ACCESS W/ PORT W/O FL MOD SED  09/20/2018   LYMPH NODE BIOPSY N/A 02/19/2018   Procedure: Sentinel LYMPH NODE BIOPSY;  Surgeon: Eloy Herring, MD;  [facility];  Service: Gynecology;  Laterality: N/A;  REPLACEMENT TOTAL KNEE Left 08/2010   X 2 on each   ROBOTIC ASSISTED TOTAL HYSTERECTOMY WITH BILATERAL SALPINGO OOPHERECTOMY N/A 02/19/2018   For endometrial cancer.  Procedure: XI ROBOTIC ASSISTED TOTAL HYSTERECTOMY WITH BILATERAL SALPINGO OOPHORECTOMY;  Surgeon: Eloy Herring, MD;  Location: [facility];  Service: Gynecology;  Laterality: N/A;   TRANSTHORACIC ECHOCARDIOGRAM  10/11/2016    EF 55-60%, grd I DD.   TUBAL  LIGATION  1985   C-Section   Family History Family History  Problem Relation Age of Onset   Diabetes Father    COPD Father    Stroke Father    Celiac disease Brother    Congenital heart disease Brother    Rheum arthritis Brother    Thyroid  disease Brother    Diabetes Brother    Dementia Brother        Lewy Body   Alzheimer's disease Mother    Arthritis Mother    Other Son        Died age 43 -Tetrology of Fallot - VSD/pulmonary atresia   Breast cancer Other        postmenopausal when diagnosed    Social History Social History   Tobacco Use   Smoking status: Never   Smokeless tobacco: Never  Vaping Use   Vaping status: Never Used  Substance Use Topics   Alcohol use: No    Alcohol/week: 0.0 standard drinks of alcohol   Drug use: No   Allergies Adhesive [tape], Banana, Egg-derived products, Latex, Linzess [linaclotide], Metformin , Ozempic  (0.25 or 0.5 mg-dose) [semaglutide (0.25 or 0.5mg -dos)], Penicillins, Pine, and Rose  Review of Systems A thorough review of systems was obtained and all systems are negative except as noted in the HPI and PMH.   Physical Exam Vital Signs  I have reviewed the triage vital signs BP (!) 142/47   Pulse 92   Temp 98.3 F (36.8 C) (Oral)   Resp 18   Ht 5' 1 (1.549 m)   Wt 107 kg   LMP 08/10/2010 Comment: Hysterectomy 02/02/18  SpO2 97%   BMI 44.59 kg/m  Physical Exam Vitals and nursing note reviewed. Exam conducted with a chaperone present Information systems manager).  Constitutional:      General: She is not in acute distress.    Appearance: Normal appearance. She is obese.  HENT:     Head: Normocephalic and atraumatic.     Right Ear: External ear normal.     Left Ear: External ear normal.     Nose: Nose normal.     Mouth/Throat:     Mouth: Mucous membranes are moist.  Eyes:     General: No scleral icterus.       Right eye: No discharge.        Left eye: No discharge.  Cardiovascular:     Rate and Rhythm: Normal rate.     Pulses:  Normal pulses.     Heart sounds: Normal heart sounds.  Pulmonary:     Effort: Pulmonary effort is normal. No tachypnea or accessory muscle usage.     Breath sounds: No stridor. Decreased breath sounds present.  Abdominal:     General: Abdomen is flat. There is no distension.     Palpations: Abdomen is soft.     Tenderness: There is no abdominal tenderness.  Genitourinary:    Comments: Rectal exam completed, no obvious external or external hemorrhoids, no frank bleeding, no obvious melena Musculoskeletal:     Cervical back: No rigidity.     Right  lower leg: Edema present.     Left lower leg: Edema present.  Skin:    General: Skin is warm and dry.     Capillary Refill: Capillary refill takes less than 2 seconds.  Neurological:     Mental Status: She is alert.  Psychiatric:        Mood and Affect: Mood normal.        Behavior: Behavior normal. Behavior is cooperative.     ED Results and Treatments Labs (all labs ordered are listed, but only abnormal results are displayed) Labs Reviewed  RETICULOCYTES - Abnormal; Notable for the following components:      Result Value   RBC. 2.46 (*)    Immature Retic Fract 27.2 (*)    All other components within normal limits  CBC WITH DIFFERENTIAL/PLATELET - Abnormal; Notable for the following components:   RBC 2.43 (*)    Hemoglobin 6.7 (*)    HCT 22.9 (*)    MCHC 29.3 (*)    RDW 16.9 (*)    Eosinophils Absolute 0.6 (*)    Abs Immature Granulocytes 0.13 (*)    All other components within normal limits  COMPREHENSIVE METABOLIC PANEL WITH GFR - Abnormal; Notable for the following components:   CO2 20 (*)    Glucose, Bld 187 (*)    BUN 31 (*)    Creatinine, Ser 1.44 (*)    Calcium 8.7 (*)    Albumin 3.2 (*)    GFR, Estimated 38 (*)    All other components within normal limits  PRO BRAIN NATRIURETIC PEPTIDE - Abnormal; Notable for the following components:   Pro Brain Natriuretic Peptide 983.0 (*)    All other components within  normal limits  TROPONIN T, HIGH SENSITIVITY - Abnormal; Notable for the following components:   Troponin T High Sensitivity 21 (*)    All other components within normal limits  PROTIME-INR  OCCULT BLOOD X 1 CARD TO LAB, STOOL  VITAMIN B12  FOLATE  IRON  AND TIBC  FERRITIN                                                                                                                          Radiology CT Chest W Contrast Result Date: 11/20/2023 CLINICAL DATA:  Abnormal x-ray.  History of endometrial cancer. EXAM: CT CHEST WITH CONTRAST TECHNIQUE: Multidetector CT imaging of the chest was performed during intravenous contrast administration. RADIATION DOSE REDUCTION: This exam was performed according to the departmental dose-optimization program which includes automated exposure control, adjustment of the mA and/or kV according to patient size and/or use of iterative reconstruction technique. CONTRAST:  <See Chart> OMNIPAQUE  IOHEXOL  350 MG/ML SOLN, 75mL OMNIPAQUE  IOHEXOL  300 MG/ML SOLN COMPARISON:  The skip choose 1 there are atherosclerotic calcifications of the aorta. FINDINGS: Cardiovascular: There is an enlarged subcarinal lymph node measuring 1 cm. There are numerous nonenlarged paratracheal lymph nodes, new from prior. There is an enlarged precarinal lymph node measuring 10 mm. No enlarged hilar  or axillary lymph nodes are seen. Visualized esophagus and thyroid  gland are within normal limits. Mediastinum/Nodes: There are mild peripheral interstitial opacities in both lung bases. The lungs are otherwise clear. There is no pleural effusion or pneumothorax. Trachea and central airways are within normal limits. Lungs/Pleura: Lungs are clear. No pleural effusion or pneumothorax. Upper Abdomen: No acute abnormality. Musculoskeletal: Degenerative changes affect the spine. IMPRESSION: 1. Mild mediastinal lymphadenopathy, new from prior. This is nonspecific and may be reactive. 2. Mild peripheral  interstitial opacities in both lung bases, nonspecific. Findings may related to edema. Aortic Atherosclerosis (ICD10-I70.0). Electronically Signed   By: Greig Pique M.D.   On: 11/20/2023 19:06   DG Chest Portable 1 View Result Date: 11/20/2023 CLINICAL DATA:  Shortness of breath, history of endometrial cancer. EXAM: PORTABLE CHEST 1 VIEW COMPARISON:  Chest radiograph March 31, 2023 FINDINGS: Increased interstitial markings of both lung fields, progressed to prior. Prominent bilateral hilum. Cardiomegaly stable to prior. Aortic knob is calcified. No pleural effusion. No acute osseous abnormality. IMPRESSION: Interval increase in interstitial marking of both lung fields which can be seen the setting of infection, pulmonary edema, interstitial lung disease, lymphangitic carcinomatosis. Recommend chest CT for further assessment if clinically warranted. Electronically Signed   By: Megan  Zare M.D.   On: 11/20/2023 17:22    Pertinent labs & imaging results that were available during my care of the patient were reviewed by me and considered in my medical decision making (see MDM for details).  Medications Ordered in ED Medications  iohexol  (OMNIPAQUE ) 350 MG/ML injection 75 mL ( Intravenous Canceled Entry 11/20/23 1745)  furosemide  (LASIX ) injection 40 mg (40 mg Intravenous Given 11/20/23 1801)  iohexol  (OMNIPAQUE ) 300 MG/ML solution 75 mL (75 mLs Intravenous Contrast Given 11/20/23 1746)                                                                                                                                     Procedures .Critical Care  Performed by: Elnor Jayson LABOR, DO Authorized by: Elnor Jayson LABOR, DO   Critical care provider statement:    Critical care time (minutes):  30   Critical care time was exclusive of:  Separately billable procedures and treating other patients   Critical care was necessary to treat or prevent imminent or life-threatening deterioration of the following conditions:   Circulatory failure and respiratory failure   Critical care was time spent personally by me on the following activities:  Development of treatment plan with patient or surrogate, discussions with consultants, evaluation of patient's response to treatment, examination of patient, ordering and review of laboratory studies, ordering and review of radiographic studies, ordering and performing treatments and interventions, pulse oximetry, re-evaluation of patient's condition, review of old charts and obtaining history from patient or surrogate   Care discussed with: admitting provider     (including critical care time)  Medical Decision Making / ED Course  Medical Decision Making:    [name] is a 73 y.o. female with past medical history as below, significant for DM, endometrial cancer, GERD, hemorrhoids hyperlipidemia, hypertension, iron  deficiency anemia, OSA on CPAP who presents to the ED with complaint of abnormal labs, exertional dyspnea. The complaint involves an extensive differential diagnosis and also carries with it a high risk of complications and morbidity.  Serious etiology was considered. Ddx includes but is not limited to: In my evaluation of this patient's dyspnea my DDx includes, but is not limited to, pneumonia, pulmonary embolism, pneumothorax, pulmonary edema, metabolic acidosis, asthma, COPD, cardiac cause, anemia, anxiety, etc.    Complete initial physical exam performed, notably the patient was in no acute distress, no hypoxia on room air.    Reviewed and confirmed nursing documentation for past medical history, family history, social history.  Vital signs reviewed.    Symptomatic anemia > - History of iron  deficiency anemia, she is compliant with iron  tablet orally. - Hemoglobin 6.7 today, was also 6.7 PCPs office.  Patient denies any BRBPR or melena, no hematemesis or hemoptysis, no hematuria.  She has history of IDA - Hemoccult negative - Plan admission for  symptomatic anemia, patient will need blood transfusion upon arrival as blood is not available here  Acute CHF > - Echo 7/18 with LVEF 55 to 60%, G1 DD - Patient reports worsening swelling to her extremities lower, variable compliance with her Lasix , orthopnea exertional dyspnea. - BNP is mildly elevated, troponin mildly elevated, chest x-ray with increased interstitial markings concerning for possible infection versus edema versus ILD versus carcinomatosis  -Troponin elevation likely demand ischemia in setting of CHF - >> CT with possible interstitial edema -Lasix  was started, patient is requiring 2 L nasal cannula does not normally use nasal cannula at home     Will admit patient for symptomatic anemia and acute CHF.  She is agreeable to plan. Dr. Franky accepts patient for admission               Additional history obtained: -Additional history obtained from family -External records from outside source obtained and reviewed including: Chart review including previous notes, labs, imaging, consultation notes including  Primary care documentation, home medications   Lab Tests: -I ordered, reviewed, and interpreted labs.   The pertinent results include:   Labs Reviewed  RETICULOCYTES - Abnormal; Notable for the following components:      Result Value   RBC. 2.46 (*)    Immature Retic Fract 27.2 (*)    All other components within normal limits  CBC WITH DIFFERENTIAL/PLATELET - Abnormal; Notable for the following components:   RBC 2.43 (*)    Hemoglobin 6.7 (*)    HCT 22.9 (*)    MCHC 29.3 (*)    RDW 16.9 (*)    Eosinophils Absolute 0.6 (*)    Abs Immature Granulocytes 0.13 (*)    All other components within normal limits  COMPREHENSIVE METABOLIC PANEL WITH GFR - Abnormal; Notable for the following components:   CO2 20 (*)    Glucose, Bld 187 (*)    BUN 31 (*)    Creatinine, Ser 1.44 (*)    Calcium 8.7 (*)    Albumin 3.2 (*)    GFR, Estimated 38 (*)    All  other components within normal limits  PRO BRAIN NATRIURETIC PEPTIDE - Abnormal; Notable for the following components:   Pro Brain Natriuretic Peptide 983.0 (*)    All other components within normal limits  TROPONIN T, HIGH SENSITIVITY - Abnormal; Notable for the following components:   Troponin T High Sensitivity 21 (*)    All other components within normal limits  PROTIME-INR  OCCULT BLOOD X 1 CARD TO LAB, STOOL  VITAMIN B12  FOLATE  IRON  AND TIBC  FERRITIN    Notable for bnp mild + trop mild +  EKG   EKG Interpretation Date/Time:  Monday November 20 2023 15:16:25 EDT Ventricular Rate:  99 PR Interval:    QRS Duration:  90 QT Interval:  344 QTC Calculation: 442 R Axis:   78  Text Interpretation: Interpretation limited secondary to artifact Normal sinus rhythm Low voltage, precordial leads Confirmed by Elnor Savant ([id number]) on 11/20/2023 5:05:51 PM         Imaging Studies ordered: I ordered imaging studies including CT chest, cxr I independently visualized the following imaging with scope of interpretation limited to determining acute life threatening conditions related to emergency care; findings noted above I agree with the radiologist interpretation If any imaging was obtained with contrast I closely monitored patient for any possible adverse reaction a/w contrast administration in the emergency department   Medicines ordered and prescription drug management: Meds ordered this encounter  Medications   furosemide  (LASIX ) injection 40 mg   iohexol  (OMNIPAQUE ) 350 MG/ML injection 75 mL   iohexol  (OMNIPAQUE ) 300 MG/ML solution 75 mL    -I have reviewed the patients home medicines and have made adjustments as needed   Consultations Obtained: I requested consultation with the hospitalist,  and discussed lab and imaging findings as well as pertinent plan    Cardiac Monitoring: The patient was maintained on a cardiac monitor.  I personally viewed and interpreted the  cardiac monitored which showed an underlying rhythm of: nsr Continuous pulse oximetry interpreted by myself, 90% on RA.    Social Determinants of Health:  Diagnosis or treatment significantly limited by social determinants of health: obesity   Reevaluation: After the interventions noted above, I reevaluated the patient and found that they have improved  Co morbidities that complicate the patient evaluation  Past Medical History:  Diagnosis Date   Allergic rhinitis    Allergy testing: results pending as of 05/28/16 (Dr. Fleeta Smock)   Anemia 10/2020   anemia of chronic dz (? + mild IDA?--iron  started   Arthritis    Chemotherapy-induced neuropathy (HCC) 2019   Cough variant asthma    DDD (degenerative disc disease), lumbar    Diabetes mellitus with complication (HCC)    Mild nonprolif DR R eye 12/2017-->referred to retinal specialist DM MANAGED BY DR. GHERGHE AS OF 2021   Endometrial cancer (HCC) 02/2018   REMISSION AS OF 08/2018.  Stage III (T3, Nx, Mx)  Path: endometroid adenocarcinoma involving cervix, uterus, and fallopian tubes (Pt got TAH & BSO 02/2018). No sign of recurrence as of onc f/u 05/2019.   Endometrial hyperplasia 07/02/2015   Treated with D&C and then Mirena IUD x 5 years.  IUD removed 3/17.     GERD (gastroesophageal reflux disease)    Hemorrhoids    Herpes zoster 10/2015   L side belt-line   History of adenomatous polyp of colon 02/25/2013   5 mm cecal polyp removed by Dr. Alfrieda 5 yrs   History of blood transfusion    History of cellulitis 08/2010   Left (Since replacemnt of Left knee   History of radiation therapy 05/21/2018   HDR [facility] brachytherapy 04/24/2018-05/21/2018  Dr Lynwood Nasuti   Hyperkalemia 03/2018   started  after pt started getting chemo-->had to stop enalapril .   Hyperlipidemia    Not on statin b/c lipids stayed down when sugars came down per pt.  She says Dr. Tommas knows she is not on statin anymore.   Hypertension    Hypothyroidism     IDA (iron  deficiency anemia)    GI ref 04/2023   Nephrolithiasis 07/2005   Obesity    OSA on CPAP    Osteoarthritis    knees, ankle, + ? scapholunate ligament disruption (x-ray 06/2017).  Progressive arthritis L wrist and hand 09/2022 radiographs   Pancytopenia due to antineoplastic chemotherapy [facility])    progressive as of 06/2018. Stable 08/2018.   Recurrent UTI    started cipro  250 qd 09/2018   Sciatica of left side    Severe persistent asthma    cough-variant--saw Allergist 05/25/16 and was switched from max dose advair to symbicort .   Systolic murmur    ECHO fine 10/2016-->murmur likely flow murmur assoc with HTN.   Zoon's vulvitis    Bx-proven (GYN) -lichen sclerosis.  Clobetasol  0.05% ointment per GYN      Dispostion: Disposition decision including need for hospitalization was considered, and patient admitted to the hospital.    Final Clinical Impression(s) / ED Diagnoses Final diagnoses:  Symptomatic anemia  Acute congestive heart failure, unspecified heart failure type (HCC)  Exertional dyspnea        Elnor Jayson LABOR, DO 11/20/23 2018

## 2023-11-20 NOTE — ED Triage Notes (Signed)
 Pt reports being told by pcp of low hgb level.  Also reports feeling shob, intermittently x 1 week. O2 noted to be 93% on RA.

## 2023-11-20 NOTE — Telephone Encounter (Signed)
 Please call pt She is severely anemia with a hemoglobin of 6.7 and her kidney function has severely declined.  Iron  is extremely low.  She does have a UTI as well- the ant biotic prescribed will treat    I recommend she present to the emergency room immediately. Her hemoglobin is getting too low to support her organs. She will require blood transfusions and other STAT labs.

## 2023-11-20 NOTE — ED Notes (Signed)
 Patient transported to CT

## 2023-11-21 ENCOUNTER — Other Ambulatory Visit (HOSPITAL_COMMUNITY)

## 2023-11-21 ENCOUNTER — Inpatient Hospital Stay (HOSPITAL_COMMUNITY)

## 2023-11-21 ENCOUNTER — Encounter (HOSPITAL_COMMUNITY): Payer: Self-pay | Admitting: Family Medicine

## 2023-11-21 DIAGNOSIS — I5033 Acute on chronic diastolic (congestive) heart failure: Secondary | ICD-10-CM | POA: Diagnosis present

## 2023-11-21 DIAGNOSIS — D649 Anemia, unspecified: Secondary | ICD-10-CM | POA: Diagnosis not present

## 2023-11-21 DIAGNOSIS — N3090 Cystitis, unspecified without hematuria: Secondary | ICD-10-CM | POA: Diagnosis present

## 2023-11-21 LAB — CBC
HCT: 21.8 % — ABNORMAL LOW (ref 36.0–46.0)
Hemoglobin: 6.5 g/dL — CL (ref 12.0–15.0)
MCH: 27.9 pg (ref 26.0–34.0)
MCHC: 29.8 g/dL — ABNORMAL LOW (ref 30.0–36.0)
MCV: 93.6 fL (ref 80.0–100.0)
Platelets: 324 K/uL (ref 150–400)
RBC: 2.33 MIL/uL — ABNORMAL LOW (ref 3.87–5.11)
RDW: 16.8 % — ABNORMAL HIGH (ref 11.5–15.5)
WBC: 7.4 K/uL (ref 4.0–10.5)
nRBC: 0 % (ref 0.0–0.2)

## 2023-11-21 LAB — BASIC METABOLIC PANEL WITH GFR
Anion gap: 8 (ref 5–15)
BUN: 27 mg/dL — ABNORMAL HIGH (ref 8–23)
CO2: 23 mmol/L (ref 22–32)
Calcium: 8.3 mg/dL — ABNORMAL LOW (ref 8.9–10.3)
Chloride: 106 mmol/L (ref 98–111)
Creatinine, Ser: 1.39 mg/dL — ABNORMAL HIGH (ref 0.44–1.00)
GFR, Estimated: 40 mL/min — ABNORMAL LOW (ref >60)
Glucose, Bld: 177 mg/dL — ABNORMAL HIGH (ref 70–99)
Potassium: 4.1 mmol/L (ref 3.5–5.1)
Sodium: 137 mmol/L (ref 135–145)

## 2023-11-21 LAB — ECHOCARDIOGRAM COMPLETE
AR max vel: 1.74 cm2
AV Area VTI: 1.72 cm2
AV Area mean vel: 1.78 cm2
AV Mean grad: 6 mmHg
AV Peak grad: 11.3 mmHg
Ao pk vel: 1.68 m/s
Area-P 1/2: 3.99 cm2
Height: 61 in
MV VTI: 1.89 cm2
S' Lateral: 3.1 cm
Weight: 3728.42 [oz_av]

## 2023-11-21 LAB — GLUCOSE, CAPILLARY
Glucose-Capillary: 129 mg/dL — ABNORMAL HIGH (ref 70–99)
Glucose-Capillary: 180 mg/dL — ABNORMAL HIGH (ref 70–99)
Glucose-Capillary: 284 mg/dL — ABNORMAL HIGH (ref 70–99)
Glucose-Capillary: 261 mg/dL — ABNORMAL HIGH (ref 70–99)
Glucose-Capillary: 291 mg/dL — ABNORMAL HIGH (ref 70–99)

## 2023-11-21 LAB — MAGNESIUM: Magnesium: 1.5 mg/dL — ABNORMAL LOW (ref 1.7–2.4)

## 2023-11-21 LAB — PREPARE RBC (CROSSMATCH)

## 2023-11-21 LAB — TSH: TSH: 2.773 u[IU]/mL (ref 0.350–4.500)

## 2023-11-21 LAB — MRSA NEXT GEN BY PCR, NASAL: MRSA by PCR Next Gen: NOT DETECTED

## 2023-11-21 MED ORDER — ORAL CARE MOUTH RINSE: 15.0000 mL | OROMUCOSAL | Status: DC | PRN

## 2023-11-21 MED ORDER — FUROSEMIDE 20 MG PO TABS
20.0000 mg | ORAL_TABLET | Freq: Every day | ORAL | Status: DC
  Filled 2023-11-21: qty 1

## 2023-11-21 MED ORDER — AMITRIPTYLINE HCL 25 MG PO TABS
50.0000 mg | ORAL_TABLET | Freq: Every day | ORAL | Status: DC
  Administered 2023-11-21 – 2023-11-22 (×6): 50 mg via ORAL
  Filled 2023-11-21 (×3): qty 2

## 2023-11-21 MED ORDER — ACETAMINOPHEN 650 MG RE SUPP: 650.0000 mg | Freq: Four times a day (QID) | RECTAL | Status: DC | PRN

## 2023-11-21 MED ORDER — SENNOSIDES-DOCUSATE SODIUM 8.6-50 MG PO TABS: 1.0000 | ORAL_TABLET | Freq: Every evening | ORAL | Status: DC | PRN

## 2023-11-21 MED ORDER — ONDANSETRON HCL 4 MG/2ML IJ SOLN: 4.0000 mg | Freq: Four times a day (QID) | INTRAMUSCULAR | Status: DC | PRN

## 2023-11-21 MED ORDER — ACETAMINOPHEN 325 MG PO TABS: 650.0000 mg | ORAL_TABLET | Freq: Four times a day (QID) | ORAL | Status: DC | PRN

## 2023-11-21 MED ORDER — CYANOCOBALAMIN 1000 MCG/ML IJ SOLN
1000.0000 ug | Freq: Once | INTRAMUSCULAR | Status: DC
  Filled 2023-11-21: qty 1

## 2023-11-21 MED ORDER — SODIUM CHLORIDE 0.9% FLUSH
3.0000 mL | Freq: Two times a day (BID) | INTRAVENOUS | Status: DC
  Administered 2023-11-21 – 2023-11-23 (×11): 3 mL via INTRAVENOUS

## 2023-11-21 MED ORDER — INSULIN GLARGINE-YFGN 100 UNIT/ML ~~LOC~~ SOLN
20.0000 [IU] | Freq: Every day | SUBCUTANEOUS | Status: DC
  Administered 2023-11-21 – 2023-11-23 (×5): 20 [IU] via SUBCUTANEOUS
  Filled 2023-11-21 (×3): qty 0.2

## 2023-11-21 MED ORDER — INSULIN ASPART 100 UNIT/ML IJ SOLN
0.0000 [IU] | Freq: Three times a day (TID) | INTRAMUSCULAR | Status: DC
  Administered 2023-11-21 (×3): 5 [IU] via SUBCUTANEOUS
  Administered 2023-11-21 (×2): 1 [IU] via SUBCUTANEOUS
  Administered 2023-11-21: 5 [IU] via SUBCUTANEOUS
  Administered 2023-11-22 (×3): 3 [IU] via SUBCUTANEOUS
  Administered 2023-11-22: 5 [IU] via SUBCUTANEOUS
  Administered 2023-11-22: 3 [IU] via SUBCUTANEOUS
  Administered 2023-11-22: 5 [IU] via SUBCUTANEOUS
  Administered 2023-11-23: 7 [IU] via SUBCUTANEOUS
  Administered 2023-11-23: 2 [IU] via SUBCUTANEOUS

## 2023-11-21 MED ORDER — MAGNESIUM SULFATE 2 GM/50ML IV SOLN
2.0000 g | Freq: Once | INTRAVENOUS | Status: AC
  Administered 2023-11-21 (×2): 2 g via INTRAVENOUS
  Filled 2023-11-21: qty 50

## 2023-11-21 MED ORDER — PERFLUTREN LIPID MICROSPHERE
1.0000 mL | INTRAVENOUS | Status: AC | PRN
  Administered 2023-11-21 (×2): 5 mL via INTRAVENOUS

## 2023-11-21 MED ORDER — IOHEXOL 350 MG/ML SOLN
75.0000 mL | Freq: Once | INTRAVENOUS | Status: AC | PRN
  Administered 2023-11-21 (×2): 75 mL via INTRAVENOUS

## 2023-11-21 MED ORDER — FELODIPINE ER 5 MG PO TB24
10.0000 mg | ORAL_TABLET | Freq: Every day | ORAL | Status: DC
Start: 2023-11-21 — End: 2023-11-23
  Administered 2023-11-21 – 2023-11-23 (×5): 10 mg via ORAL
  Filled 2023-11-21 (×3): qty 2

## 2023-11-21 MED ORDER — PRAVASTATIN SODIUM 40 MG PO TABS
40.0000 mg | ORAL_TABLET | Freq: Every day | ORAL | Status: DC
Start: 2023-11-21 — End: 2023-11-23
  Administered 2023-11-21 – 2023-11-23 (×5): 40 mg via ORAL
  Filled 2023-11-21 (×3): qty 1

## 2023-11-21 MED ORDER — HYDROCODONE-ACETAMINOPHEN 5-325 MG PO TABS
1.0000 | ORAL_TABLET | Freq: Four times a day (QID) | ORAL | Status: DC | PRN
  Administered 2023-11-21 – 2023-11-22 (×6): 1 via ORAL
  Filled 2023-11-21 (×3): qty 1

## 2023-11-21 MED ORDER — CEFUROXIME AXETIL 250 MG PO TABS
250.0000 mg | ORAL_TABLET | Freq: Two times a day (BID) | ORAL | Status: AC
  Administered 2023-11-21 – 2023-11-23 (×9): 250 mg via ORAL
  Filled 2023-11-21 (×5): qty 1

## 2023-11-21 MED ORDER — ONDANSETRON HCL 4 MG PO TABS: 4.0000 mg | ORAL_TABLET | Freq: Four times a day (QID) | ORAL | Status: DC | PRN

## 2023-11-21 MED ORDER — INSULIN ASPART 100 UNIT/ML IJ SOLN
0.0000 [IU] | Freq: Every day | INTRAMUSCULAR | Status: DC
  Administered 2023-11-21 – 2023-11-22 (×4): 3 [IU] via SUBCUTANEOUS

## 2023-11-21 MED ORDER — FUROSEMIDE 10 MG/ML IJ SOLN
40.0000 mg | Freq: Two times a day (BID) | INTRAMUSCULAR | Status: DC
  Administered 2023-11-21 (×2): 40 mg via INTRAVENOUS
  Filled 2023-11-21: qty 4

## 2023-11-21 MED ORDER — LEVOTHYROXINE SODIUM 25 MCG PO TABS
125.0000 ug | ORAL_TABLET | Freq: Every day | ORAL | Status: DC
  Administered 2023-11-21 – 2023-11-23 (×5): 125 ug via ORAL
  Filled 2023-11-21 (×3): qty 1

## 2023-11-21 MED ORDER — ALBUTEROL SULFATE (2.5 MG/3ML) 0.083% IN NEBU
2.5000 mg | INHALATION_SOLUTION | RESPIRATORY_TRACT | Status: DC | PRN
  Administered 2023-11-21 (×2): 2.5 mg via RESPIRATORY_TRACT
  Filled 2023-11-21: qty 3

## 2023-11-21 MED ORDER — SODIUM CHLORIDE 0.9% IV SOLUTION: Freq: Once | INTRAVENOUS | Status: DC

## 2023-11-21 MED ORDER — CHLORHEXIDINE GLUCONATE CLOTH 2 % EX PADS
6.0000 | MEDICATED_PAD | Freq: Every day | CUTANEOUS | Status: DC
  Administered 2023-11-22 (×2): 6 via TOPICAL

## 2023-11-21 NOTE — Inpatient Diabetes Management (Signed)
 Inpatient Diabetes Program Recommendations  AACE/ADA: New Consensus Statement on Inpatient Glycemic Control (2015)  Target Ranges:  Prepandial:   less than 140 mg/dL      Peak postprandial:   less than 180 mg/dL (1-2 hours)      Critically ill patients:  140 - 180 mg/dL   Lab Results  Component Value Date   GLUCAP 284 (H) 11/21/2023   HGBA1C 5.8 (A) 10/18/2023    Review of Glycemic Control  Latest Reference Range & Units 11/21/23 00:58 11/21/23 08:07 11/21/23 11:48  Glucose-Capillary 70 - 99 mg/dL 870 (H) 819 (H) 715 (H)   Diabetes history: DM 2 Outpatient Diabetes medications: NPH 45-50 units, Novolog  10-20, metformin  500 mg qam, 1000 mg qpm Current orders for Inpatient glycemic control:  Semglee  20 units Novolog  0-9 units tid + hs A1c 5.8% on 7/9  Note: glucose trends increase after PO intake  Inpatient Diabetes Program Recommendations:    -   May consider Novolog  3 units tid meal coverage if eating >50% of meals  Thanks,  Clotilda Bull RN, MSN, BC-ADM Inpatient Diabetes Coordinator Team Pager [phone] (8a-5p)

## 2023-11-21 NOTE — Progress Notes (Signed)
 Pt transferred to 3E at this time.

## 2023-11-21 NOTE — Progress Notes (Signed)
 Heart Failure Navigator Progress Note  Assessed for Heart & Vascular TOC clinic readiness.  Patient does not meet criteria due to EF 55-60%, admission for symptomatic anemia. Has a scheduled PCP appointment on 12/01/2023. No HF TOC. .   Navigator will sign off at this time.   Stephane Haddock, BSN, Scientist, clinical (histocompatibility and immunogenetics) Only

## 2023-11-21 NOTE — H&P (Signed)
 History and Physical    NOVALI VOLLMAN FMW:[id number] DOB: Oct 28, 1950 DOA: 11/20/2023  PCP: Candise Aleene DEL, MD   Patient coming from: Home   Chief Complaint: Abnormal labs (low Hgb, increased SCr), increased leg swelling, orthopnea, DOE   HPI: [name] is a 73 y.o. female with medical history significant for hypertension, type 2 diabetes mellitus, asthma, OSA, endometrial cancer status post total abdominal hysterectomy with bilateral salpingo-oophorectomy, hypothyroidism, CKD 3A, hypothyroidism, chronic HFpEF, and iron  deficiency anemia who presents with increased leg swelling, orthopnea, exertional dyspnea, and outpatient blood work concerning for acute on chronic anemia and increased creatinine.  Patient acknowledges poor adherence with her diuretics and notes progressive bilateral lower extremity swelling, orthopnea, and worsening exertional dyspnea.  She had outpatient blood work performed, was notified that her hemoglobin was low, renal function was worse, and that she should seek evaluation in the ED.  She is also being treated for a UTI due to pansensitive E. coli.  She denies abdominal pain, hematemesis, melena, or hematochezia.  She takes iron  supplements and recently had iron  infusion.  [facility] ED Course: Upon arrival to the ED, patient is found to be afebrile and saturating low 90s on room air with mild tachypnea, normal HR, and stable BP.  Labs are most notable for creatinine 1.44, normal WBC, hemoglobin 6.7, troponin 21, BNP 983, and negative FOBT.  CT chest reveals mild mediastinal adenopathy and mild interstitial opacities bilaterally.  Patient was given 40 mg IV Lasix  and transferred to [facility] for admission.  Review of Systems:  All other systems reviewed and apart from HPI, are negative.  Past Medical History:  Diagnosis Date   Allergic rhinitis    Allergy testing: results pending as of 05/28/16 (Dr. Fleeta Smock)   Anemia 10/2020   anemia of chronic dz (? +  mild IDA?--iron  started   Arthritis    Chemotherapy-induced neuropathy (HCC) 2019   Cough variant asthma    DDD (degenerative disc disease), lumbar    Diabetes mellitus with complication (HCC)    Mild nonprolif DR R eye 12/2017-->referred to retinal specialist DM MANAGED BY DR. GHERGHE AS OF 2021   Endometrial cancer (HCC) 02/2018   REMISSION AS OF 08/2018.  Stage III (T3, Nx, Mx)  Path: endometroid adenocarcinoma involving cervix, uterus, and fallopian tubes (Pt got TAH & BSO 02/2018). No sign of recurrence as of onc f/u 05/2019.   Endometrial hyperplasia 07/02/2015   Treated with D&C and then Mirena IUD x 5 years.  IUD removed 3/17.     GERD (gastroesophageal reflux disease)    Hemorrhoids    Herpes zoster 10/2015   L side belt-line   History of adenomatous polyp of colon 02/25/2013   5 mm cecal polyp removed by Dr. Alfrieda 5 yrs   History of blood transfusion    History of cellulitis 08/2010   Left (Since replacemnt of Left knee   History of radiation therapy 05/21/2018   HDR [facility] brachytherapy 04/24/2018-05/21/2018  Dr Lynwood Nasuti   Hyperkalemia 03/2018   started after pt started getting chemo-->had to stop enalapril .   Hyperlipidemia    Not on statin b/c lipids stayed down when sugars came down per pt.  She says Dr. Tommas knows she is not on statin anymore.   Hypertension    Hypothyroidism    IDA (iron  deficiency anemia)    GI ref 04/2023   Nephrolithiasis 07/2005   Obesity    OSA on CPAP    Osteoarthritis  knees, ankle, + ? scapholunate ligament disruption (x-ray 06/2017).  Progressive arthritis L wrist and hand 09/2022 radiographs   Pancytopenia due to antineoplastic chemotherapy [facility])    progressive as of 06/2018. Stable 08/2018.   Recurrent UTI    started cipro  250 qd 09/2018   Sciatica of left side    Severe persistent asthma    cough-variant--saw Allergist 05/25/16 and was switched from max dose advair to symbicort .   Systolic murmur    ECHO fine 10/2016-->murmur  likely flow murmur assoc with HTN.   Zoon's vulvitis    Bx-proven (GYN) -lichen sclerosis.  Clobetasol  0.05% ointment per GYN    Past Surgical History:  Procedure Laterality Date   ANKLE FRACTURE SURGERY  1984   Pin & repair   ARTHROSCOPIC REPAIR ACL  2000   Due to ACL tear   ARTHROSCOPIC REPAIR ACL  08/2006   Blateral knee rerlacements x4 2 times each knee     BREAST BIOPSY Right 2014   Benign   CARDIAC CATHETERIZATION  08/2005   clear vessel mild mitral    CARPAL TUNNEL RELEASE Right [id number]   1989 Left   CESAREAN SECTION  1985   CHOLECYSTECTOMY OPEN  1988   COLONOSCOPY N/A 02/25/2013   Tubular adenoma x 1: Recall 5 yrs. Procedure: COLONOSCOPY;  Surgeon: Renaye Sous, MD;  Location: [facility];  Service: Endoscopy;  Laterality: N/A;   COLONOSCOPY     08/22/2023 (IDA) [facility]--> 1 polyp, +diverticulosis.   COMBINED HYSTEROSCOPY DIAGNOSTIC / D&C  05/2010   Bx neg   DEXA  08/2007; 05/12/20   Bone density normal 2009 and 2022.  Plan rpt 2024.   DILATION AND CURETTAGE OF UTERUS  03/2010   ESOPHAGOGASTRODUODENOSCOPY     08/22/2023 [facility] (IDA): antral erythema, nothing to explain bleeding   IR IMAGING GUIDED PORT INSERTION  03/16/2018   IR REMOVAL TUN ACCESS W/ PORT W/O FL MOD SED  09/20/2018   LYMPH NODE BIOPSY N/A 02/19/2018   Procedure: Sentinel LYMPH NODE BIOPSY;  Surgeon: Eloy Herring, MD;  Location: [facility];  Service: Gynecology;  Laterality: N/A;   REPLACEMENT TOTAL KNEE Left 08/2010   X 2 on each   ROBOTIC ASSISTED TOTAL HYSTERECTOMY WITH BILATERAL SALPINGO OOPHERECTOMY N/A 02/19/2018   For endometrial cancer.  Procedure: XI ROBOTIC ASSISTED TOTAL HYSTERECTOMY WITH BILATERAL SALPINGO OOPHORECTOMY;  Surgeon: Eloy Herring, MD;  Location: [facility];  Service: Gynecology;  Laterality: N/A;   TRANSTHORACIC ECHOCARDIOGRAM  10/11/2016    EF 55-60%, grd I DD.   TUBAL LIGATION  1985   C-Section    Social History:   reports that she  has never smoked. She has never used smokeless tobacco. She reports that she does not drink alcohol and does not use drugs.  Allergies  Allergen Reactions   Adhesive [Tape] Other (See Comments)    Takes patient's skin off.   Banana Diarrhea and Nausea Only   Egg-Derived Products Diarrhea and Nausea Only   Latex Other (See Comments)    sensativity   Linzess [Linaclotide] Diarrhea   Metformin  Diarrhea   Ozempic  (0.25 Or 0.5 Mg-Dose) [Semaglutide (0.25 Or 0.5mg -Dos)] Diarrhea   Penicillins Rash and Other (See Comments)    Has had cephalosporins without incident   Pine Swelling and Rash   Rose Swelling and Rash    Family History  Problem Relation Age of Onset   Diabetes Father    COPD Father    Stroke Father    Celiac  disease Brother    Congenital heart disease Brother    Rheum arthritis Brother    Thyroid  disease Brother    Diabetes Brother    Dementia Brother        Lewy Body   Alzheimer's disease Mother    Arthritis Mother    Other Son        Died age 3 -Tetrology of Fallot - VSD/pulmonary atresia   Breast cancer Other        postmenopausal when diagnosed     Prior to Admission medications   Medication Sig Start Date End Date Taking? Authorizing Provider  albuterol  (PROVENTIL ) (2.5 MG/3ML) 0.083% nebulizer solution Take 3 mLs (2.5 mg total) by nebulization every 4 (four) hours as needed for wheezing or shortness of breath (Dx: J45.50). 07/04/17   McGowen, Aleene DEL, MD  albuterol  (VENTOLIN  HFA) 108 (90 Base) MCG/ACT inhaler INHALE 2 PUFFS INTO LUNGS EVERY 6 HOURS AS NEEDED FOR WHEEZING/SHORTNESS OF BREATH 09/08/23   McGowen, Aleene DEL, MD  amitriptyline  (ELAVIL ) 25 MG tablet Take 2 tablets (50 mg total) by mouth at bedtime. 10/09/23   McGowen, Aleene DEL, MD  cefdinir  (OMNICEF ) 300 MG capsule Take 1 capsule (300 mg total) by mouth 2 (two) times daily for 7 days. 11/17/23 11/24/23  Kuneff, Renee A, DO  clobetasol  ointment (TEMOVATE ) 0.05 % APPLY 1 APPLICATION TOPICALLY AS NEEDED.  APPLY TO THE SKIN OF THE VULVA FOR ITCHING 01/17/23   Cross, Melissa D, NP  diclofenac  Sodium (VOLTAREN ) 1 % GEL Apply 2 g topically 4 (four) times daily. 07/19/19   McGowen, Aleene DEL, MD  felodipine  (PLENDIL ) 10 MG 24 hr tablet TAKE 1 TABLET BY MOUTH EVERY DAY 09/26/23   McGowen, Aleene DEL, MD  Ferrous Sulfate (IRON  PO) Take by mouth.    [provider]  fluticasone  (CUTIVATE ) 0.05 % cream APPLY TO AFFECTED AREA OF RIGHT LOWER LEG TWICE PER DAY. 02/28/22   McGowen, Aleene DEL, MD  fluticasone  (VERAMYST) 27.5 MCG/SPRAY nasal spray Place 1 spray into the nose daily as needed for rhinitis.    [provider]  furosemide  (LASIX ) 20 MG tablet Take 1 tablet (20 mg total) by mouth daily. 01/06/23   McGowen, Aleene DEL, MD  HYDROcodone -acetaminophen  (NORCO) 10-325 MG tablet 1/2 - 1 tab po bid prn pain 09/25/23   McGowen, Aleene DEL, MD  Insulin  Aspart FlexPen (NOVOLOG ) 100 UNIT/ML NJECT 10-20 UNITS INTO THE SKIN 3 (THREE) 15 MINUTES BEFORE MEALS 11/14/23   Trixie File, MD  insulin  NPH Human (NOVOLIN  N) 100 UNIT/ML injection Inject 45-50 units under skin daily as advised 10/18/23   Trixie File, MD  Insulin  Pen Needle 32G X 4 MM MISC Use 3x a day 01/18/23   Trixie File, MD  Insulin  Syringes, Disposable, U-100 0.3 ML MISC Use to inject insulin --6 injections per day 02/05/18   McGowen, Philip H, MD  levocetirizine (XYZAL) 5 MG tablet Take 5 mg by mouth at bedtime as needed for allergies.  01/30/17   [provider]  levothyroxine  (SYNTHROID ) 125 MCG tablet TAKE 1 TABLET (125 MCG TOTAL) BY MOUTH DAILY. OFFICE VISIT NEEDED FOR FURTHER REFILLS 09/28/23   McGowen, Aleene DEL, MD  lisinopril  (ZESTRIL ) 10 MG tablet Take 2 tablets (20 mg total) by mouth daily. 10/19/23   Trixie File, MD  metFORMIN  (GLUCOPHAGE ) 500 MG tablet TAKE 1 TABLET BY MOUTH EVERY MORNING AND 2 TABLETS BY MOUTH AT BEDTIME. STRENGTH: 500 MG 07/04/23   Trixie File, MD  miconazole (MICOTIN) 2 % powder Apply 1  application topically 2 (two) times daily as needed for itching.    [provider]  Mometasone  Furo-Formoterol  Fum (DULERA ) 50-5 MCG/ACT AERO TAKE 2 PUFFS BY MOUTH TWICE A DAY 10/06/23   McGowen, Aleene DEL, MD  omeprazole (PRILOSEC) 40 MG capsule Take 40 mg by mouth daily.    [provider]  pravastatin  (PRAVACHOL ) 40 MG tablet TAKE 1 TABLET BY MOUTH EVERY DAY 07/04/23   McGowen, Aleene DEL, MD  Probiotic Product (PROBIOTIC PO) Take 1 capsule by mouth daily.    [provider]  VITAMIN D PO Take by mouth.    [provider]    Physical Exam: Vitals:   11/20/23 2030 11/20/23 2145 11/20/23 2200 11/20/23 2300  BP: (!) 133/38 (!) 139/106  (!) 154/51  Pulse: 85 86  85  Resp: 19 (!) 24  20  Temp:    98.6 F (37 C)  TempSrc:    Oral  SpO2: 96% 97%  91%  Weight:   105.7 kg   Height:        Constitutional: NAD, no pallor or diaphoresis   Eyes: PERTLA, lids and conjunctivae normal ENMT: Mucous membranes are moist. Posterior pharynx clear of any exudate or lesions.   Neck: supple, no masses  Respiratory: no wheezing, no crackles. No accessory muscle use.  Cardiovascular: S1 & S2 heard, regular rate and rhythm. Lower extremity edema.  Abdomen: No tenderness, soft. Bowel sounds active.  Musculoskeletal: no clubbing / cyanosis. No joint deformity upper and lower extremities.   Skin: no significant rashes, lesions, ulcers. Warm, dry, well-perfused. Neurologic: CN 2-12 grossly intact. Moving all extremities. Alert and oriented.  Psychiatric: Calm. Cooperative.    Labs and Imaging on Admission: I have personally reviewed following labs and imaging studies  CBC: Recent Labs  Lab 11/17/23 1442 11/20/23 1525  WBC 9.7 8.4  NEUTROABS 6,936 5.8  HGB 6.7* 6.7*  HCT 22.2* 22.9*  MCV 92.1 94.2  PLT 307 337   Basic Metabolic Panel: Recent Labs  Lab 11/17/23 1442 11/20/23 1525  NA 137 139  K 4.6 4.3  CL 102 105  CO2 23 20*  GLUCOSE 84 187*  BUN 35* 31*   CREATININE 1.63* 1.44*  CALCIUM 8.6 8.7*   GFR: Estimated Creatinine Clearance: 39.6 mL/min (A) (by C-G formula based on SCr of 1.44 mg/dL (H)). Liver Function Tests: Recent Labs  Lab 11/17/23 1442 11/20/23 1525  AST 11 17  ALT 8 6  ALKPHOS  --  70  BILITOT 0.2 0.2  PROT 7.2 7.2  ALBUMIN  --  3.2*   No results for input(s): LIPASE, AMYLASE in the last 168 hours. No results for input(s): AMMONIA in the last 168 hours. Coagulation Profile: Recent Labs  Lab 11/20/23 1557  INR 1.0   Cardiac Enzymes: No results for input(s): CKTOTAL, CKMB, CKMBINDEX, TROPONINI in the last 168 hours. BNP (last 3 results) Recent Labs    11/20/23 1540  PROBNP 983.0*   HbA1C: No results for input(s): HGBA1C in the last 72 hours. CBG: Recent Labs  Lab 11/21/23 0058  GLUCAP 129*   Lipid Profile: No results for input(s): CHOL, HDL, LDLCALC, TRIG, CHOLHDL, LDLDIRECT in the last 72 hours. Thyroid  Function Tests: No results for input(s): TSH, T4TOTAL, FREET4, T3FREE, THYROIDAB in the last 72 hours. Anemia Panel: Recent Labs    11/20/23 1515 11/20/23 1557  VITAMINB12  --  319  FOLATE  --  11.5  FERRITIN  --  446*  TIBC  --  235*  IRON   --  35  RETICCTPCT 2.5  --    Urine analysis:    Component Value Date/Time   COLORURINE ORANGE (A) 11/17/2023 1332   APPEARANCEUR TURBID (A) 11/17/2023 1332   LABSPEC 1.012 11/17/2023 1332   PHURINE < OR = 5.0 (A) 11/17/2023 1332   GLUCOSEU NEGATIVE 11/17/2023 1332   HGBUR TRACE (A) 11/17/2023 1332   BILIRUBINUR 2+ 11/17/2023 1434   KETONESUR NEGATIVE 11/17/2023 1332   PROTEINUR Positive (A) 11/17/2023 1434   PROTEINUR 2+ (A) 11/17/2023 1332   UROBILINOGEN 2.0 (A) 11/17/2023 1434   UROBILINOGEN 0.2 08/05/2010 1120   NITRITE positive 11/17/2023 1434   NITRITE POSITIVE (A) 03/14/2018 0827   LEUKOCYTESUR Large (3+) (A) 11/17/2023 1434   Sepsis Labs: @LABRCNTIP (procalcitonin:4,lacticidven:4) ) Recent  Results (from the past 240 hours)  Urine Culture     Status: Abnormal   Collection Time: 11/17/23  1:32 PM  Result Value Ref Range Status   MICRO NUMBER: [id number]  Final   SPECIMEN QUALITY: Adequate  Final   Sample Source URINE  Final   STATUS: FINAL  Final   ISOLATE 1: Escherichia coli (A)  Final    Comment: 50,000-100,000 CFU/mL of Escherichia coli      Susceptibility   Escherichia coli - URINE CULTURE, REFLEX    AMOX/CLAVULANIC <=2 Sensitive     AMPICILLIN/SULBACTAM <=2 Sensitive     CEFAZOLIN * 2 Not Reportable      * For infections other than uncomplicated UTI caused by E. coli, K. pneumoniae or P. mirabilis: Cefazolin  is resistant if MIC > or = 8 mcg/mL. (Distinguishing susceptible versus intermediate for isolates with MIC < or = 4 mcg/mL requires additional testing.) For uncomplicated UTI caused by E. coli, K. pneumoniae or P. mirabilis: Cefazolin  is susceptible if MIC <32 mcg/mL and predicts susceptible to the oral agents cefaclor, cefdinir , cefpodoxime, cefprozil, cefuroxime , cephalexin  and loracarbef.     CEFTAZIDIME <=0.5 Sensitive     CEFEPIME <=0.12 Sensitive     CEFTRIAXONE <=0.25 Sensitive     CIPROFLOXACIN  <=0.06 Sensitive     LEVOFLOXACIN  <=0.12 Sensitive     GENTAMICIN <=1 Sensitive     IMIPENEM <=0.25 Sensitive     MEROPENEM <=0.25 Sensitive     NITROFURANTOIN  <=16 Sensitive     PIP/TAZO <=4 Sensitive     TRIMETH /SULFA * <=20 Sensitive      * For infections other than uncomplicated UTI caused by E. coli, K. pneumoniae or P. mirabilis: Cefazolin  is resistant if MIC > or = 8 mcg/mL. (Distinguishing susceptible versus intermediate for isolates with MIC < or = 4 mcg/mL requires additional testing.) For uncomplicated UTI caused by E. coli, K. pneumoniae or P. mirabilis: Cefazolin  is susceptible if MIC <32 mcg/mL and predicts susceptible to the oral agents cefaclor, cefdinir , cefpodoxime, cefprozil, cefuroxime , cephalexin  and loracarbef. Legend: S =  Susceptible  I = Intermediate R = Resistant  NS = Not susceptible SDD = Susceptible Dose Dependent * = Not Tested  NR = Not Reported **NN = See Therapy Comments      Radiological Exams on Admission: CT Chest W Contrast Result Date: 11/20/2023 CLINICAL DATA:  Abnormal x-ray.  History of endometrial cancer. EXAM: CT CHEST WITH CONTRAST TECHNIQUE: Multidetector CT imaging of the chest was performed during intravenous contrast administration. RADIATION DOSE REDUCTION: This exam was performed according to the departmental dose-optimization program which includes automated exposure control, adjustment of the mA and/or kV according to patient size and/or use of iterative reconstruction technique. CONTRAST:  <See Chart> OMNIPAQUE  IOHEXOL   350 MG/ML SOLN, 75mL OMNIPAQUE  IOHEXOL  300 MG/ML SOLN COMPARISON:  The skip choose 1 there are atherosclerotic calcifications of the aorta. FINDINGS: Cardiovascular: There is an enlarged subcarinal lymph node measuring 1 cm. There are numerous nonenlarged paratracheal lymph nodes, new from prior. There is an enlarged precarinal lymph node measuring 10 mm. No enlarged hilar or axillary lymph nodes are seen. Visualized esophagus and thyroid  gland are within normal limits. Mediastinum/Nodes: There are mild peripheral interstitial opacities in both lung bases. The lungs are otherwise clear. There is no pleural effusion or pneumothorax. Trachea and central airways are within normal limits. Lungs/Pleura: Lungs are clear. No pleural effusion or pneumothorax. Upper Abdomen: No acute abnormality. Musculoskeletal: Degenerative changes affect the spine. IMPRESSION: 1. Mild mediastinal lymphadenopathy, new from prior. This is nonspecific and may be reactive. 2. Mild peripheral interstitial opacities in both lung bases, nonspecific. Findings may related to edema. Aortic Atherosclerosis (ICD10-I70.0). Electronically Signed   By: Greig Pique M.D.   On: 11/20/2023 19:06   DG Chest Portable 1  View Result Date: 11/20/2023 CLINICAL DATA:  Shortness of breath, history of endometrial cancer. EXAM: PORTABLE CHEST 1 VIEW COMPARISON:  Chest radiograph March 31, 2023 FINDINGS: Increased interstitial markings of both lung fields, progressed to prior. Prominent bilateral hilum. Cardiomegaly stable to prior. Aortic knob is calcified. No pleural effusion. No acute osseous abnormality. IMPRESSION: Interval increase in interstitial marking of both lung fields which can be seen the setting of infection, pulmonary edema, interstitial lung disease, lymphangitic carcinomatosis. Recommend chest CT for further assessment if clinically warranted. Electronically Signed   By: Megan  Zare M.D.   On: 11/20/2023 17:22    EKG: Independently reviewed. Sinus rhythm.   Assessment/Plan   1. Symptomatic anemia  - Hgb 6.7, down from 7.9 in May 2025  - No overt bleeding, FOBT negative  - Transfuse 1 unit RBC and repeat CBC    2. Acute on chronic HFpEF  - Continue diuresis with IV Lasix , monitor weight and I/Os, monitor renal function and electrolytes, hold ACE-i for now given increased creatinine    3. CKD 3A  - SCr is 1.44 on admission; baseline appears closer to 1.2  - Hold ACE-i for now, renally-dose medications, monitor closely while diuresing   4. Type II DM  - Check CBGs, continue treatment with insulin     5. Asthma; OSA  - Not in asthma exacerbation  - CPAP while sleeping; albuterol  as-needed    6. UTI  - Currently on day 3 of cefdinir  for UTI d/t pan-sensitive E coli - Not septic  - Continue treatment with cefuroxime  (formulary equivalent)   7. Chronic pain  - Prescription database reviewed, plan to continue Norco as-needed    DVT prophylaxis: SCDs  Code Status: Full  Level of Care: Level of care: Telemetry Cardiac Family Communication: None present  Disposition Plan:  Patient is from: home  Anticipated d/c is to: Home  Anticipated d/c date is: 11/22/23  Patient currently: Pending  stable H&H, stable renal function, improved volume status  Consults called: None  Admission status: Inpatient     Evalene GORMAN Sprinkles, MD [facility]  11/21/2023, 2:22 AM

## 2023-11-21 NOTE — Progress Notes (Signed)
 PROGRESS NOTE    [name]  FMW:[id number] DOB: [birth year] DOA: 11/20/2023 PCP: Candise Aleene DEL, MD  Chief Complaint  Patient presents with   Abnormal Lab   Shortness of Breath    Brief Narrative:   [name] is [name] 73 y.o. female with medical history significant for hypertension, type 2 diabetes mellitus, asthma, OSA, endometrial cancer status post total abdominal hysterectomy with bilateral salpingo-oophorectomy, hypothyroidism, CKD 3A, hypothyroidism, chronic HFpEF, and iron  deficiency anemia who presents with increased leg swelling, orthopnea, exertional dyspnea, and outpatient blood work concerning for acute on chronic anemia and increased creatinine.   She's been admitted for symptomatic anemia.  Assessment & Plan:   Principal Problem:   Symptomatic anemia Active Problems:   Cough variant asthma   OSA (obstructive sleep apnea)   HTN (hypertension)   Chronic pain   Endometrial cancer (HCC)   Insulin  dependent type 2 diabetes mellitus (HCC)   Chronic kidney disease, stage 3a (HCC)   Acute on chronic heart failure with preserved ejection fraction (HFpEF) (HCC)   Cystitis  Symptomatic Anemia Hb down to 6.7 (from 8.8 in April).   Was 7.5 in 04/2023.  She received IV iron  around that time. Had EGD/colonoscopy 08/2023 (EGD with normal esophagus, erythema in mucosa in antrum with few tiny erosions.  Small duodenal diverticulum.  Multiple non bleeding diverticula in sigmoid colon.  Mild severity diverticulosis.  Single sessile 2 mm non bleeding polyp of benign appearance (s/p polypectomy).  Small external and internal hemorrhoids.  I can't see path in care everywhere. Elevated ferritin, iron  is low normal, low TIBC.  Low normal b12 (follow MMA, supplement).  Normal folate. CT showed mild mediastinal LAD, mild peripheral interstitial opacities Will get CT abdomen pelvis with her prior history and unclear cause of her anemia Follow TSH S/p 1 unit pRBC, follow  symptoms  Shortness of Breath Due to symptomatic anemia above She only has trace edema on my exam, echo with normal EF, normal diastolic parameters, IVC normal with >50% resp variability - I don't think this is HF.   Follow UA for protein.  Albumin is 3.2, normal LFT's.  CT chest with Mild Mediastinal LAD and Mild Peripheral Interstitial Opacities Follow   CKD IIIa Baseline ~1.2 Trend with blood Follow UA, additional w/u as indicated  Hx Stage IIIa Endometroid Endometrial Carcinoma  Hypomagnesemia Replace and follow  T2DM SSI + basal insulin    Hypertension Lasix , plendil   Dyslipidemia Statin  E. Coli UTI Pansensitive Continue abx   Chronic Pain  Nightly amitriptyline , norco prn    DVT prophylaxis: SCD Code Status: full Family Communication: none Disposition:   Status is: Inpatient Remains inpatient appropriate because: need for inpatient care   Consultants:  none  Procedures:  Echo IMPRESSIONS     1. Left ventricular ejection fraction, by estimation, is 55 to 60%. The  left ventricle has normal function. The left ventricle has no regional  wall motion abnormalities. There is mild concentric left ventricular  hypertrophy. Left ventricular diastolic  parameters were normal.   2. Right ventricular systolic function is normal. The right ventricular  size is normal. There is normal pulmonary artery systolic pressure. The  estimated right ventricular systolic pressure is 21.3 mmHg.   3. The mitral valve is grossly normal. Trivial mitral valve  regurgitation. No evidence of mitral stenosis.   4. The aortic valve is grossly normal. Aortic valve regurgitation is not  visualized. No aortic stenosis is present.   5. The inferior vena cava  is normal in size with greater than 50%  respiratory variability, suggesting right atrial pressure of 3 mmHg.   Antimicrobials:  Anti-infectives (From admission, onward)    Start     Dose/Rate Route Frequency Ordered Stop    11/21/23 0800  cefUROXime  (CEFTIN ) tablet 250 mg        250 mg Oral 2 times daily with meals 11/21/23 0041 11/24/23 0759       Subjective: No new complaints Notes SOB with exertion over past few weeks Sluggish/low energy since around April   Objective: Vitals:   11/21/23 0739 11/21/23 0754 11/21/23 1130 11/21/23 1152  BP: (!) 133/46 (!) 137/52 (!) 133/51   Pulse: 77 77 79 78  Resp: 18 18 18 20   Temp: 98.1 F (36.7 C) 98 F (36.7 C) 98.1 F (36.7 C) 98.1 F (36.7 C)  TempSrc: Oral Oral Oral Oral  SpO2: 92% 92% 92% 95%  Weight:      Height:        Intake/Output Summary (Last 24 hours) at 11/21/2023 1536 Last data filed at 11/21/2023 1130 Gross per 24 hour  Intake 370 ml  Output 1850 ml  Net -1480 ml   Filed Weights   11/20/23 1511 11/20/23 2200 11/21/23 0500  Weight: 107 kg 105.7 kg 105.7 kg    Examination:  General exam: Appears calm and comfortable  Respiratory system: Clear to auscultation. Respiratory effort normal. Cardiovascular system: RRR Gastrointestinal system: Abdomen is nondistended, soft and nontender.  Central nervous system: Alert and oriented. No focal neurological deficits. Extremities: trace LE edema   Data Reviewed: I have personally reviewed following labs and imaging studies  CBC: Recent Labs  Lab 11/17/23 1442 11/20/23 1525 11/21/23 0548  WBC 9.7 8.4 7.4  NEUTROABS 6,936 5.8  --   HGB 6.7* 6.7* 6.5*  HCT 22.2* 22.9* 21.8*  MCV 92.1 94.2 93.6  PLT 307 337 324    Basic Metabolic Panel: Recent Labs  Lab 11/17/23 1442 11/20/23 1525 11/21/23 0548  NA 137 139 137  K 4.6 4.3 4.1  CL 102 105 106  CO2 23 20* 23  GLUCOSE 84 187* 177*  BUN 35* 31* 27*  CREATININE 1.63* 1.44* 1.39*  CALCIUM 8.6 8.7* 8.3*  MG  --   --  1.5*    GFR: Estimated Creatinine Clearance: 41 mL/min (Leylah Tarnow) (by C-G formula based on SCr of 1.39 mg/dL (H)).  Liver Function Tests: Recent Labs  Lab 11/17/23 1442 11/20/23 1525  AST 11 17  ALT 8 6   ALKPHOS  --  70  BILITOT 0.2 0.2  PROT 7.2 7.2  ALBUMIN  --  3.2*    CBG: Recent Labs  Lab 11/21/23 0058 11/21/23 0807 11/21/23 1148  GLUCAP 129* 180* 284*     Recent Results (from the past 240 hours)  Urine Culture     Status: Abnormal   Collection Time: 11/17/23  1:32 PM  Result Value Ref Range Status   MICRO NUMBER: [id number]  Final   SPECIMEN QUALITY: Adequate  Final   Sample Source URINE  Final   STATUS: FINAL  Final   ISOLATE 1: Escherichia coli (Omeka Holben)  Final    Comment: 50,000-100,000 CFU/mL of Escherichia coli      Susceptibility   Escherichia coli - URINE CULTURE, REFLEX    AMOX/CLAVULANIC <=2 Sensitive     AMPICILLIN/SULBACTAM <=2 Sensitive     CEFAZOLIN * 2 Not Reportable      * For infections other than uncomplicated UTI caused by E. coli,  K. pneumoniae or P. mirabilis: Cefazolin  is resistant if MIC > or = 8 mcg/mL. (Distinguishing susceptible versus intermediate for isolates with MIC < or = 4 mcg/mL requires additional testing.) For uncomplicated UTI caused by E. coli, K. pneumoniae or P. mirabilis: Cefazolin  is susceptible if MIC <32 mcg/mL and predicts susceptible to the oral agents cefaclor, cefdinir , cefpodoxime, cefprozil, cefuroxime , cephalexin  and loracarbef.     CEFTAZIDIME <=0.5 Sensitive     CEFEPIME <=0.12 Sensitive     CEFTRIAXONE <=0.25 Sensitive     CIPROFLOXACIN  <=0.06 Sensitive     LEVOFLOXACIN  <=0.12 Sensitive     GENTAMICIN <=1 Sensitive     IMIPENEM <=0.25 Sensitive     MEROPENEM <=0.25 Sensitive     NITROFURANTOIN  <=16 Sensitive     PIP/TAZO <=4 Sensitive     TRIMETH /SULFA * <=20 Sensitive      * For infections other than uncomplicated UTI caused by E. coli, K. pneumoniae or P. mirabilis: Cefazolin  is resistant if MIC > or = 8 mcg/mL. (Distinguishing susceptible versus intermediate for isolates with MIC < or = 4 mcg/mL requires additional testing.) For uncomplicated UTI caused by E. coli, K. pneumoniae or P. mirabilis:  Cefazolin  is susceptible if MIC <32 mcg/mL and predicts susceptible to the oral agents cefaclor, cefdinir , cefpodoxime, cefprozil, cefuroxime , cephalexin  and loracarbef. Legend: S = Susceptible  I = Intermediate R = Resistant  NS = Not susceptible SDD = Susceptible Dose Dependent * = Not Tested  NR = Not Reported **NN = See Therapy Comments   MRSA Next Gen by PCR, Nasal     Status: None   Collection Time: 11/21/23  1:11 AM   Specimen: Nasal Mucosa; Nasal Swab  Result Value Ref Range Status   MRSA by PCR Next Gen NOT DETECTED NOT DETECTED Final    Comment: (NOTE) The GeneXpert MRSA Assay (FDA approved for NASAL specimens only), is one component of Camani Sesay comprehensive MRSA colonization surveillance program. It is not intended to diagnose MRSA infection nor to guide or monitor treatment for MRSA infections. Test performance is not FDA approved in patients less than 47 years old. Performed at [facility], [address] [place], [place] [address]          Radiology Studies: ECHOCARDIOGRAM COMPLETE Result Date: 11/21/2023    ECHOCARDIOGRAM REPORT   Patient Name:   [name] Date of Exam: 11/21/2023 Medical Rec #:  [id number]      Height:       61.0 in Accession #:    [id number]     Weight:       233.0 lb Date of Birth:  05-Feb-1951      BSA:          2.016 m Patient Age:    72 years       BP:           137/52 mmHg Patient Gender: F              HR:           80 bpm. Exam Location:  Inpatient Procedure: 2D Echo, Cardiac Doppler, Color Doppler and Intracardiac            Opacification Agent (Both Spectral and Color Flow Doppler were            utilized during procedure). Indications:    CHF  History:        Patient has prior history of Echocardiogram examinations, most  recent 10/11/2016. CHF; Risk Factors:Obesity, Sleep Apnea,                 Hypertension, Diabetes and Dyslipidemia.  Sonographer:    Therisa Crouch Referring Phys: [id number] TIMOTHY S OPYD  Sonographer  Comments: Image acquisition challenging due to patient body habitus. IMPRESSIONS  1. Left ventricular ejection fraction, by estimation, is 55 to 60%. The left ventricle has normal function. The left ventricle has no regional wall motion abnormalities. There is mild concentric left ventricular hypertrophy. Left ventricular diastolic parameters were normal.  2. Right ventricular systolic function is normal. The right ventricular size is normal. There is normal pulmonary artery systolic pressure. The estimated right ventricular systolic pressure is 21.3 mmHg.  3. The mitral valve is grossly normal. Trivial mitral valve regurgitation. No evidence of mitral stenosis.  4. The aortic valve is grossly normal. Aortic valve regurgitation is not visualized. No aortic stenosis is present.  5. The inferior vena cava is normal in size with greater than 50% respiratory variability, suggesting right atrial pressure of 3 mmHg. FINDINGS  Left Ventricle: Left ventricular ejection fraction, by estimation, is 55 to 60%. The left ventricle has normal function. The left ventricle has no regional wall motion abnormalities. Definity  contrast agent was given IV to delineate the left ventricular  endocardial borders. The left ventricular internal cavity size was normal in size. There is mild concentric left ventricular hypertrophy. Left ventricular diastolic parameters were normal. Right Ventricle: The right ventricular size is normal. No increase in right ventricular wall thickness. Right ventricular systolic function is normal. There is normal pulmonary artery systolic pressure. The tricuspid regurgitant velocity is 2.14 m/s, and  with an assumed right atrial pressure of 3 mmHg, the estimated right ventricular systolic pressure is 21.3 mmHg. Left Atrium: Left atrial size was normal in size. Right Atrium: Right atrial size was normal in size. Pericardium: There is no evidence of pericardial effusion. Presence of epicardial fat layer. Mitral  Valve: The mitral valve is grossly normal. Trivial mitral valve regurgitation. No evidence of mitral valve stenosis. MV peak gradient, 7.8 mmHg. The mean mitral valve gradient is 4.0 mmHg. Tricuspid Valve: The tricuspid valve is grossly normal. Tricuspid valve regurgitation is trivial. No evidence of tricuspid stenosis. Aortic Valve: The aortic valve is grossly normal. Aortic valve regurgitation is not visualized. No aortic stenosis is present. Aortic valve mean gradient measures 6.0 mmHg. Aortic valve peak gradient measures 11.3 mmHg. Aortic valve area, by VTI measures  1.72 cm. Pulmonic Valve: The pulmonic valve was grossly normal. Pulmonic valve regurgitation is not visualized. No evidence of pulmonic stenosis. Aorta: The aortic root and ascending aorta are structurally normal, with no evidence of dilitation. Venous: The inferior vena cava is normal in size with greater than 50% respiratory variability, suggesting right atrial pressure of 3 mmHg. IAS/Shunts: No atrial level shunt detected by color flow Doppler.  LEFT VENTRICLE PLAX 2D LVIDd:         4.50 cm   Diastology LVIDs:         3.10 cm   LV e' medial:    9.79 cm/s LV PW:         1.30 cm   LV E/e' medial:  12.8 LV IVS:        1.50 cm   LV e' lateral:   7.62 cm/s LVOT diam:     2.00 cm   LV E/e' lateral: 16.4 LV SV:         58 LV SV Index:  29 LVOT Area:     3.14 cm  RIGHT VENTRICLE          IVC RV Basal diam:  3.40 cm  IVC diam: 1.40 cm TAPSE (M-mode): 1.8 cm LEFT ATRIUM             Index LA diam:        3.90 cm 1.93 cm/m LA Vol (A2C):   54.7 ml 27.14 ml/m LA Vol (A4C):   65.4 ml 32.45 ml/m LA Biplane Vol: 60.5 ml 30.02 ml/m  AORTIC VALVE AV Area (Vmax):    1.74 cm AV Area (Vmean):   1.78 cm AV Area (VTI):     1.72 cm AV Vmax:           168.00 cm/s AV Vmean:          113.000 cm/s AV VTI:            0.336 m AV Peak Grad:      11.3 mmHg AV Mean Grad:      6.0 mmHg LVOT Vmax:         93.30 cm/s LVOT Vmean:        63.900 cm/s LVOT VTI:          0.184  m LVOT/AV VTI ratio: 0.55  AORTA Ao Asc diam: 3.30 cm MITRAL VALVE                TRICUSPID VALVE MV Area (PHT): 3.99 cm     TR Peak grad:   18.3 mmHg MV Area VTI:   1.89 cm     TR Vmax:        214.00 cm/s MV Peak grad:  7.8 mmHg MV Mean grad:  4.0 mmHg     SHUNTS MV Vmax:       1.40 m/s     Systemic VTI:  0.18 m MV Vmean:      94.4 cm/s    Systemic Diam: 2.00 cm MV Decel Time: 190 msec MV E velocity: 125.00 cm/s MV Yariah Selvey velocity: 95.40 cm/s MV E/Adiah Guereca ratio:  1.31 Darryle Decent MD Electronically signed by Darryle Decent MD Signature Date/Time: 11/21/2023/3:32:05 PM    Final    CT Chest W Contrast Result Date: 11/20/2023 CLINICAL DATA:  Abnormal x-ray.  History of endometrial cancer. EXAM: CT CHEST WITH CONTRAST TECHNIQUE: Multidetector CT imaging of the chest was performed during intravenous contrast administration. RADIATION DOSE REDUCTION: This exam was performed according to the departmental dose-optimization program which includes automated exposure control, adjustment of the mA and/or kV according to patient size and/or use of iterative reconstruction technique. CONTRAST:  <See Chart> OMNIPAQUE  IOHEXOL  350 MG/ML SOLN, 75mL OMNIPAQUE  IOHEXOL  300 MG/ML SOLN COMPARISON:  The skip choose 1 there are atherosclerotic calcifications of the aorta. FINDINGS: Cardiovascular: There is an enlarged subcarinal lymph node measuring 1 cm. There are numerous nonenlarged paratracheal lymph nodes, new from prior. There is an enlarged precarinal lymph node measuring 10 mm. No enlarged hilar or axillary lymph nodes are seen. Visualized esophagus and thyroid  gland are within normal limits. Mediastinum/Nodes: There are mild peripheral interstitial opacities in both lung bases. The lungs are otherwise clear. There is no pleural effusion or pneumothorax. Trachea and central airways are within normal limits. Lungs/Pleura: Lungs are clear. No pleural effusion or pneumothorax. Upper Abdomen: No acute abnormality. Musculoskeletal:  Degenerative changes affect the spine. IMPRESSION: 1. Mild mediastinal lymphadenopathy, new from prior. This is nonspecific and may be reactive. 2. Mild peripheral interstitial opacities in both lung bases, nonspecific.  Findings may related to edema. Aortic Atherosclerosis (ICD10-I70.0). Electronically Signed   By: Greig Pique M.D.   On: 11/20/2023 19:06   DG Chest Portable 1 View Result Date: 11/20/2023 CLINICAL DATA:  Shortness of breath, history of endometrial cancer. EXAM: PORTABLE CHEST 1 VIEW COMPARISON:  Chest radiograph March 31, 2023 FINDINGS: Increased interstitial markings of both lung fields, progressed to prior. Prominent bilateral hilum. Cardiomegaly stable to prior. Aortic knob is calcified. No pleural effusion. No acute osseous abnormality. IMPRESSION: Interval increase in interstitial marking of both lung fields which can be seen the setting of infection, pulmonary edema, interstitial lung disease, lymphangitic carcinomatosis. Recommend chest CT for further assessment if clinically warranted. Electronically Signed   By: Megan  Zare M.D.   On: 11/20/2023 17:22        Scheduled Meds:  sodium chloride    Intravenous Once   amitriptyline   50 mg Oral QHS   cefUROXime   250 mg Oral BID WC   Chlorhexidine  Gluconate Cloth  6 each Topical Daily   felodipine   10 mg Oral Daily   furosemide   40 mg Intravenous Q12H   insulin  aspart  0-5 Units Subcutaneous QHS   insulin  aspart  0-9 Units Subcutaneous TID WC   insulin  glargine-yfgn  20 Units Subcutaneous Daily   levothyroxine   125 mcg Oral Q0600   pravastatin   40 mg Oral Daily   sodium chloride  flush  3 mL Intravenous Q12H   Continuous Infusions:   LOS: 1 day    Time spent: over 30 min    Meliton Monte, MD [facility]   To contact the attending provider between 7A-7P or the covering provider during after hours 7P-7A, please log into the web site [email or web address] and access using universal [place] password for that  web site. If you do not have the password, please call the hospital operator.  11/21/2023, 3:36 PM

## 2023-11-21 NOTE — TOC Initial Note (Signed)
 Transition of Care [facility]) - Initial/Assessment Note    Patient Details  Name: [name] MRN: [id number] Date of Birth: 1950/10/03  [facility] CM/SW Contact:    Sudie Erminio Deems, RN Phone Number: 11/21/2023, 8:33 AM  Clinical Narrative: Patient presented for increased leg swelling, orthopnea-symptomatic anemia. Plan for transfusion one unit PRBC's. PTA patient reports that she is from home with spouse and adult daughter. Patient has DME: wheelchair, scooter, cane, rolling walker, and CPAP in the home. Patient states she has PCP and has no issues getting to appointments. Patient has insurance and gets medications from [facility]. Case Manager will continue to follow for disposition needs as the patient progresses.      Expected Discharge Plan: Home w Home Health Services Barriers to Discharge: Continued Medical Work up   Patient Goals and CMS Choice Patient states their goals for this hospitalization and ongoing recovery are:: Plan to return home once stable.   Choice offered to / list presented to : NA      Expected Discharge Plan and Services In-house Referral: NA Discharge Planning Services: CM Consult Post Acute Care Choice: NA Living arrangements for the past 2 months: Single Family Home                   DME Agency: NA  Prior Living Arrangements/Services Living arrangements for the past 2 months: Single Family Home Lives with:: Adult Children Patient language and need for interpreter reviewed:: Yes Do you feel safe going back to the place where you live?: Yes      Need for Family Participation in Patient Care: Yes (Comment) Care giver support system in place?: Yes (comment) Current home services: DME (wheelchair, scooter, cane, rolling walker, CPAP) Criminal Activity/Legal Involvement Pertinent to Current Situation/Hospitalization: No - Comment as needed  Activities of Daily Living      Permission  Sought/Granted Permission sought to share information with : Family Supports, Case Manager                Emotional Assessment Appearance:: Appears stated age Attitude/Demeanor/Rapport: Engaged Affect (typically observed): Appropriate Orientation: : Oriented to Self, Oriented to Place, Oriented to  Time, Oriented to Situation Alcohol / Substance Use: Not Applicable Psych Involvement: No (comment)  Admission diagnosis:  Exertional dyspnea [R06.09] Symptomatic anemia [D64.9] Acute congestive heart failure, unspecified heart failure type (HCC) [I50.9] Patient Active Problem List   Diagnosis Date Noted   Acute on chronic heart failure with preserved ejection fraction (HFpEF) (HCC) 11/21/2023   Cystitis 11/21/2023   Symptomatic anemia 11/20/2023   Iron  deficiency anemia 11/17/2023   Chronic kidney disease, stage 3a (HCC) 11/17/2023   Albuminuria 11/17/2023   Nuclear sclerotic cataract of right eye 02/08/2021   History of colonic polyps 06/30/2020   Dysphagia 06/30/2020   Insulin  dependent type 2 diabetes mellitus (HCC) 05/07/2019   Pancytopenia, acquired (HCC) 04/30/2018   Bilateral edema of lower extremity 04/09/2018   Peripheral neuropathy due to chemotherapy (HCC) 04/09/2018   Endometrial cancer (HCC) 02/19/2018   Morbid obesity (HCC) 02/19/2018   Chronic pain 11/03/2017   Scapholunate ligament injury, no instability 06/29/2017   HTN (hypertension) 09/27/2016   Dyspnea 09/27/2016   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   Hypothyroidism 07/10/2015   OSA (obstructive sleep apnea) 07/10/2015   Cough variant asthma 11/02/2014   PCP:  Candise Aleene DEL, MD Pharmacy:   [phone] [place], [place] - [address] [place]  [address] Phone: [phone] Fax: [phone]     Social Drivers of Health (SDOH) Social History: SDOH Screenings   Food Insecurity: Patient Declined (11/16/2023)  Housing: Unknown (11/16/2023)  Transportation Needs: No  Transportation Needs (11/16/2023)  Utilities: Not At Risk (06/07/2022)  Alcohol Screen: Low Risk  (04/29/2020)  Depression (PHQ2-9): Medium Risk (12/20/2022)  Financial Resource Strain: Low Risk  (11/16/2023)  Physical Activity: Inactive (11/16/2023)  Social Connections: Unknown (11/16/2023)  Stress: No Stress Concern Present (11/16/2023)  Tobacco Use: Low Risk  (11/21/2023)   SDOH Interventions:     Readmission Risk Interventions     No data to display

## 2023-11-22 DIAGNOSIS — D649 Anemia, unspecified: Secondary | ICD-10-CM | POA: Diagnosis not present

## 2023-11-22 LAB — CBC WITH DIFFERENTIAL/PLATELET
Absolute Lymphocytes: 1213 {cells}/uL (ref 850–3900)
Absolute Monocytes: 1048 {cells}/uL — ABNORMAL HIGH (ref 200–950)
Basophils Absolute: 39 {cells}/uL (ref 0–200)
Basophils Relative: 0.4 %
Eosinophils Absolute: 466 {cells}/uL (ref 15–500)
Eosinophils Relative: 4.8 %
HCT: 22.2 % — ABNORMAL LOW (ref 35.0–45.0)
Hemoglobin: 6.7 g/dL — ABNORMAL LOW (ref 11.7–15.5)
MCH: 27.8 pg (ref 27.0–33.0)
MCHC: 30.2 g/dL — ABNORMAL LOW (ref 32.0–36.0)
MCV: 92.1 fL (ref 80.0–100.0)
MPV: 9.4 fL (ref 7.5–12.5)
Monocytes Relative: 10.8 %
Neutro Abs: 6936 {cells}/uL (ref 1500–7800)
Neutrophils Relative %: 71.5 %
Platelets: 307 Thousand/uL (ref 140–400)
RBC: 2.41 Million/uL — ABNORMAL LOW (ref 3.80–5.10)
RDW: 15.5 % — ABNORMAL HIGH (ref 11.0–15.0)
Total Lymphocyte: 12.5 %
WBC: 9.7 Thousand/uL (ref 3.8–10.8)

## 2023-11-22 LAB — COMPREHENSIVE METABOLIC PANEL WITH GFR
AG Ratio: 0.9 (calc) — ABNORMAL LOW (ref 1.0–2.5)
ALT: 8 U/L (ref 6–29)
AST: 11 U/L (ref 10–35)
Albumin: 3.4 g/dL — ABNORMAL LOW (ref 3.6–5.1)
Alkaline phosphatase (APISO): 64 U/L (ref 37–153)
BUN/Creatinine Ratio: 21 (calc) (ref 6–22)
BUN: 35 mg/dL — ABNORMAL HIGH (ref 7–25)
CO2: 23 mmol/L (ref 20–32)
Calcium: 8.6 mg/dL (ref 8.6–10.4)
Chloride: 102 mmol/L (ref 98–110)
Creat: 1.63 mg/dL — ABNORMAL HIGH (ref 0.60–1.00)
Globulin: 3.8 g/dL — ABNORMAL HIGH (ref 1.9–3.7)
Glucose, Bld: 84 mg/dL (ref 65–99)
Potassium: 4.6 mmol/L (ref 3.5–5.3)
Sodium: 137 mmol/L (ref 135–146)
Total Bilirubin: 0.2 mg/dL (ref 0.2–1.2)
Total Protein: 7.2 g/dL (ref 6.1–8.1)
eGFR: 33 mL/min/1.73m2 — ABNORMAL LOW (ref >=60)

## 2023-11-22 LAB — TYPE AND SCREEN
ABO/RH(D): O POS
Antibody Screen: NEGATIVE
Unit division: 0

## 2023-11-22 LAB — GLOMERULAR BASEMENT MEMBRANE ANTIBODIES: GBM Ab: <1 AI (ref <1.0)

## 2023-11-22 LAB — CBC
HCT: 25.5 % — ABNORMAL LOW (ref 36.0–46.0)
Hemoglobin: 7.8 g/dL — ABNORMAL LOW (ref 12.0–15.0)
MCH: 28.2 pg (ref 26.0–34.0)
MCHC: 30.6 g/dL (ref 30.0–36.0)
MCV: 92.1 fL (ref 80.0–100.0)
Platelets: 334 K/uL (ref 150–400)
RBC: 2.77 MIL/uL — ABNORMAL LOW (ref 3.87–5.11)
RDW: 17 % — ABNORMAL HIGH (ref 11.5–15.5)
WBC: 8 K/uL (ref 4.0–10.5)
nRBC: 0 % (ref 0.0–0.2)

## 2023-11-22 LAB — BASIC METABOLIC PANEL WITH GFR
Anion gap: 10 (ref 5–15)
BUN: 23 mg/dL (ref 8–23)
CO2: 25 mmol/L (ref 22–32)
Calcium: 8.5 mg/dL — ABNORMAL LOW (ref 8.9–10.3)
Chloride: 103 mmol/L (ref 98–111)
Creatinine, Ser: 1.29 mg/dL — ABNORMAL HIGH (ref 0.44–1.00)
GFR, Estimated: 44 mL/min — ABNORMAL LOW (ref >60)
Glucose, Bld: 231 mg/dL — ABNORMAL HIGH (ref 70–99)
Potassium: 3.9 mmol/L (ref 3.5–5.1)
Sodium: 138 mmol/L (ref 135–145)

## 2023-11-22 LAB — BPAM RBC
Blood Product Expiration Date: 202508162359
ISSUE DATE / TIME: 202508120733
Unit Type and Rh: 9500

## 2023-11-22 LAB — ANA, IFA COMPREHENSIVE PANEL
Anti Nuclear Antibody (ANA): NEGATIVE
ENA SM Ab Ser-aCnc: <1 AI
SM/RNP: <1 AI
SSA (Ro) (ENA) Antibody, IgG: <1 AI
SSB (La) (ENA) Antibody, IgG: <1 AI
Scleroderma (Scl-70) (ENA) Antibody, IgG: <1 AI
ds DNA Ab: <1 [IU]/mL

## 2023-11-22 LAB — IRON,TIBC AND FERRITIN PANEL
%SAT: 12 % — ABNORMAL LOW (ref 16–45)
Ferritin: 445 ng/mL — ABNORMAL HIGH (ref 16–288)
Iron: 24 ug/dL — ABNORMAL LOW (ref 45–160)
TIBC: 204 ug/dL — ABNORMAL LOW (ref 250–450)

## 2023-11-22 LAB — GLUCOSE, CAPILLARY
Glucose-Capillary: 227 mg/dL — ABNORMAL HIGH (ref 70–99)
Glucose-Capillary: 254 mg/dL — ABNORMAL HIGH (ref 70–99)
Glucose-Capillary: 261 mg/dL — ABNORMAL HIGH (ref 70–99)
Glucose-Capillary: 297 mg/dL — ABNORMAL HIGH (ref 70–99)

## 2023-11-22 LAB — ANCA SCREEN W REFLEX TITER: ANCA SCREEN: NEGATIVE

## 2023-11-22 LAB — C3 AND C4
C3 Complement: 166 mg/dL (ref 83–193)
C4 Complement: 21 mg/dL (ref 15–57)

## 2023-11-22 MED ORDER — SODIUM CHLORIDE 0.9 % IV SOLN
300.0000 mg | Freq: Once | INTRAVENOUS | Status: AC
  Administered 2023-11-22 (×2): 300 mg via INTRAVENOUS
  Filled 2023-11-22: qty 15

## 2023-11-22 MED ORDER — ENOXAPARIN SODIUM 60 MG/0.6ML IJ SOSY
50.0000 mg | PREFILLED_SYRINGE | INTRAMUSCULAR | Status: DC
  Filled 2023-11-22: qty 0.6

## 2023-11-22 MED ORDER — IRON SUCROSE 300 MG IVPB - SIMPLE MED
300.0000 mg | Freq: Once | Status: DC
  Filled 2023-11-22: qty 265

## 2023-11-22 MED ORDER — FUROSEMIDE 10 MG/ML IJ SOLN
40.0000 mg | Freq: Two times a day (BID) | INTRAMUSCULAR | Status: DC
  Administered 2023-11-22 – 2023-11-23 (×5): 40 mg via INTRAVENOUS
  Filled 2023-11-22 (×3): qty 4

## 2023-11-22 NOTE — Inpatient Diabetes Management (Signed)
 Inpatient Diabetes Program Recommendations  AACE/ADA: New Consensus Statement on Inpatient Glycemic Control   Target Ranges:  Prepandial:   less than 140 mg/dL      Peak postprandial:   less than 180 mg/dL (1-2 hours)      Critically ill patients:  140 - 180 mg/dL    Latest Reference Range & Units 11/21/23 08:07 11/21/23 11:48 11/21/23 16:20 11/21/23 21:08 11/22/23 05:47  Glucose-Capillary 70 - 99 mg/dL 819 (H) 715 (H) 738 (H) 291 (H) 227 (H)   Review of Glycemic Control  Diabetes history: DM2 Outpatient Diabetes medications: Novolog  10-20 units TID with meals, NPH 15 units QAM, 10 units at lunch, 20 units at bedtime, Metformin  500 mg QAM, Metformin  1000 mg QHS Current orders for Inpatient glycemic control: Semglee  20 units daily, Novolog  0-9 units TID with meals, Novolog  0-5 units QHS  Inpatient Diabetes Program Recommendations:    Insulin : Please consider increasing Semglee  to 25 units daily and ordering Novolog  4 units TID with meals for meal coverage if patient eats at least 50% of meals.  Thanks, Earnie Gainer, RN, MSN, CDCES Diabetes Coordinator Inpatient Diabetes Program [phone] (Team Pager from 8am to 5pm)

## 2023-11-22 NOTE — Plan of Care (Signed)
   Problem: Clinical Measurements: Goal: Ability to maintain clinical measurements within normal limits will improve Outcome: Progressing

## 2023-11-22 NOTE — Progress Notes (Signed)
 PROGRESS NOTE    [name]  FMW:[id number] DOB: 1951-03-03 DOA: 11/20/2023 PCP: Candise Aleene DEL, MD   73/F w hypertension, type 2 diabetes mellitus, asthma, OSA, endometrial cancer status post total abdominal hysterectomy with bilateral salpingo-oophorectomy, hypothyroidism, CKD 3A, hypothyroidism, chronic HFpEF, and iron  deficiency anemia presented to the ED with increased leg swelling, orthopnea, exertional dyspnea, and outpatient blood work concerning for acute on chronic anemia and increased creatinine.  -Hemoglobin down to 6.7, admitted, transfused PRBC, started on IV Lasix  as well   Subjective: Feels better, no further dyspnea, energy levels are improving  Assessment and Plan:  Acute on chronic anemia Secondary to iron  deficiency and chronic disease -Given IV IV iron  infusion in January as well - EGD colonoscopy 5/25 noted mild erythema in the stomach antrum, nonbleeding sigmoid diverticuli, single sessile 2 mm polyp and some scant hemorrhoids  Abnormal CT - Extensive soft tissue stranding noted in bilateral perinephric, peripelvic soft tissue space - Has some symptoms of UTI from last week as well, plan to treat for 7 days, I am concerned about potential recurrence of her endometrial CA - Will message Dr. Leonce Ada, she would benefit from repeat CT in 4-6 weeks after completion of UTI treatment -also check SPEP  E. coli UTI, pansensitive - Continue cefuroxime , treat for 7 days total considering extent of CT findings as above which may not be related  Acute on chronic diastolic CHF - Echo with preserved EF, normal RV - Suspect volume overload related to worsening anemia, continue IV Lasix  today, switch to oral diuretics tomorrow  CKD 3 Stable, monitor  Stage IIIa endometrial carcinoma -sp staging surgery, followed by chemo/XRT - See discussion above  Chronic pain Continue amitriptyline , Norco as needed   DVT prophylaxis: Add Lovenox  Code Status: Full  code Family Communication: None present Disposition Plan:   Consultants:    Procedures:   Antimicrobials:    Objective: Vitals:   11/22/23 0325 11/22/23 0500 11/22/23 0809 11/22/23 1158  BP: (!) 135/49  138/64 (!) 154/54  Pulse: 69  77 78  Resp: 20  20 18   Temp: (!) 97.1 F (36.2 C)  (!) 97.5 F (36.4 C) 98 F (36.7 C)  TempSrc: Axillary  Oral Oral  SpO2: 96%  91% 96%  Weight:  103.5 kg    Height:        Intake/Output Summary (Last 24 hours) at 11/22/2023 1332 Last data filed at 11/21/2023 1800 Gross per 24 hour  Intake 350 ml  Output --  Net 350 ml   Filed Weights   11/20/23 2200 11/21/23 0500 11/22/23 0500  Weight: 105.7 kg 105.7 kg 103.5 kg    Examination:  General exam: Appears calm and comfortable  Respiratory system: Creased breath sounds at the bases Cardiovascular system: S1 & S2 heard, RRR.  Abd: nondistended, soft obese and nontender.Normal bowel sounds heard. Central nervous system: Alert and oriented. No focal neurological deficits. Extremities: 1+ edema Skin: No rashes Psychiatry:  Mood & affect appropriate.     Data Reviewed:   CBC: Recent Labs  Lab 11/17/23 1442 11/20/23 1525 11/21/23 0548 11/22/23 0214  WBC 9.7 8.4 7.4 8.0  NEUTROABS 6,936 5.8  --   --   HGB 6.7* 6.7* 6.5* 7.8*  HCT 22.2* 22.9* 21.8* 25.5*  MCV 92.1 94.2 93.6 92.1  PLT 307 337 324 334   Basic Metabolic Panel: Recent Labs  Lab 11/17/23 1442 11/20/23 1525 11/21/23 0548 11/22/23 0214  NA 137 139 137 138  K 4.6 4.3  4.1 3.9  CL 102 105 106 103  CO2 23 20* 23 25  GLUCOSE 84 187* 177* 231*  BUN 35* 31* 27* 23  CREATININE 1.63* 1.44* 1.39* 1.29*  CALCIUM 8.6 8.7* 8.3* 8.5*  MG  --   --  1.5*  --    GFR: Estimated Creatinine Clearance: 43.6 mL/min (A) (by C-G formula based on SCr of 1.29 mg/dL (H)). Liver Function Tests: Recent Labs  Lab 11/17/23 1442 11/20/23 1525  AST 11 17  ALT 8 6  ALKPHOS  --  70  BILITOT 0.2 0.2  PROT 7.2 7.2  ALBUMIN  --   3.2*   No results for input(s): LIPASE, AMYLASE in the last 168 hours. No results for input(s): AMMONIA in the last 168 hours. Coagulation Profile: Recent Labs  Lab 11/20/23 1557  INR 1.0   Cardiac Enzymes: No results for input(s): CKTOTAL, CKMB, CKMBINDEX, TROPONINI in the last 168 hours. BNP (last 3 results) Recent Labs    11/20/23 1540  PROBNP 983.0*   HbA1C: No results for input(s): HGBA1C in the last 72 hours. CBG: Recent Labs  Lab 11/21/23 1148 11/21/23 1620 11/21/23 2108 11/22/23 0547 11/22/23 1040  GLUCAP 284* 261* 291* 227* 254*   Lipid Profile: No results for input(s): CHOL, HDL, LDLCALC, TRIG, CHOLHDL, LDLDIRECT in the last 72 hours. Thyroid  Function Tests: Recent Labs    11/21/23 1815  TSH 2.773   Anemia Panel: Recent Labs    11/20/23 1515 11/20/23 1557  VITAMINB12  --  319  FOLATE  --  11.5  FERRITIN  --  446*  TIBC  --  235*  IRON   --  35  RETICCTPCT 2.5  --    Urine analysis:    Component Value Date/Time   COLORURINE ORANGE (A) 11/17/2023 1332   APPEARANCEUR TURBID (A) 11/17/2023 1332   LABSPEC 1.012 11/17/2023 1332   PHURINE < OR = 5.0 (A) 11/17/2023 1332   GLUCOSEU NEGATIVE 11/17/2023 1332   HGBUR TRACE (A) 11/17/2023 1332   BILIRUBINUR 2+ 11/17/2023 1434   KETONESUR NEGATIVE 11/17/2023 1332   PROTEINUR Positive (A) 11/17/2023 1434   PROTEINUR 2+ (A) 11/17/2023 1332   UROBILINOGEN 2.0 (A) 11/17/2023 1434   UROBILINOGEN 0.2 08/05/2010 1120   NITRITE positive 11/17/2023 1434   NITRITE POSITIVE (A) 03/14/2018 0827   LEUKOCYTESUR Large (3+) (A) 11/17/2023 1434   Sepsis Labs: @LABRCNTIP (procalcitonin:4,lacticidven:4)  ) Recent Results (from the past 240 hours)  Urine Culture     Status: Abnormal   Collection Time: 11/17/23  1:32 PM  Result Value Ref Range Status   MICRO NUMBER: [id number]  Final   SPECIMEN QUALITY: Adequate  Final   Sample Source URINE  Final   STATUS: FINAL  Final   ISOLATE 1:  Escherichia coli (A)  Final    Comment: 50,000-100,000 CFU/mL of Escherichia coli      Susceptibility   Escherichia coli - URINE CULTURE, REFLEX    AMOX/CLAVULANIC <=2 Sensitive     AMPICILLIN/SULBACTAM <=2 Sensitive     CEFAZOLIN * 2 Not Reportable      * For infections other than uncomplicated UTI caused by E. coli, K. pneumoniae or P. mirabilis: Cefazolin  is resistant if MIC > or = 8 mcg/mL. (Distinguishing susceptible versus intermediate for isolates with MIC < or = 4 mcg/mL requires additional testing.) For uncomplicated UTI caused by E. coli, K. pneumoniae or P. mirabilis: Cefazolin  is susceptible if MIC <32 mcg/mL and predicts susceptible to the oral agents cefaclor, cefdinir , cefpodoxime, cefprozil, cefuroxime , cephalexin   and loracarbef.     CEFTAZIDIME <=0.5 Sensitive     CEFEPIME <=0.12 Sensitive     CEFTRIAXONE <=0.25 Sensitive     CIPROFLOXACIN  <=0.06 Sensitive     LEVOFLOXACIN  <=0.12 Sensitive     GENTAMICIN <=1 Sensitive     IMIPENEM <=0.25 Sensitive     MEROPENEM <=0.25 Sensitive     NITROFURANTOIN  <=16 Sensitive     PIP/TAZO <=4 Sensitive     TRIMETH /SULFA * <=20 Sensitive      * For infections other than uncomplicated UTI caused by E. coli, K. pneumoniae or P. mirabilis: Cefazolin  is resistant if MIC > or = 8 mcg/mL. (Distinguishing susceptible versus intermediate for isolates with MIC < or = 4 mcg/mL requires additional testing.) For uncomplicated UTI caused by E. coli, K. pneumoniae or P. mirabilis: Cefazolin  is susceptible if MIC <32 mcg/mL and predicts susceptible to the oral agents cefaclor, cefdinir , cefpodoxime, cefprozil, cefuroxime , cephalexin  and loracarbef. Legend: S = Susceptible  I = Intermediate R = Resistant  NS = Not susceptible SDD = Susceptible Dose Dependent * = Not Tested  NR = Not Reported **NN = See Therapy Comments   MRSA Next Gen by PCR, Nasal     Status: None   Collection Time: 11/21/23  1:11 AM   Specimen: Nasal Mucosa;  Nasal Swab  Result Value Ref Range Status   MRSA by PCR Next Gen NOT DETECTED NOT DETECTED Final    Comment: (NOTE) The GeneXpert MRSA Assay (FDA approved for NASAL specimens only), is one component of a comprehensive MRSA colonization surveillance program. It is not intended to diagnose MRSA infection nor to guide or monitor treatment for MRSA infections. Test performance is not FDA approved in patients less than 66 years old. Performed at [facility], [address] [place], [place] [address]      Radiology Studies: CT ABDOMEN PELVIS W CONTRAST Result Date: 11/22/2023 CLINICAL DATA:  Anemia, endometrial cancer, * Tracking Code: BO * EXAM: CT ABDOMEN AND PELVIS WITH CONTRAST TECHNIQUE: Multidetector CT imaging of the abdomen and pelvis was performed using the standard protocol following bolus administration of intravenous contrast. RADIATION DOSE REDUCTION: This exam was performed according to the departmental dose-optimization program which includes automated exposure control, adjustment of the mA and/or kV according to patient size and/or use of iterative reconstruction technique. CONTRAST:  75mL OMNIPAQUE  IOHEXOL  350 MG/ML SOLN COMPARISON:  06/25/2020 FINDINGS: Lower chest: Mild interstitial and alveolar pulmonary edema. Mild cardiomegaly. Hepatobiliary: No focal liver abnormality is seen. Status post cholecystectomy. No biliary dilatation. Pancreas: Moderate fatty atrophy. No superimposed acute inflammatory change or peripancreatic fluid collection. Spleen: Unremarkable Adrenals/Urinary Tract: Adrenal glands are unremarkable. The kidneys are normal in size and position there is extensive perinephric, peripelvic, and periureteric soft tissue infiltration. Delayed images, however, demonstrate no significant hydronephrosis. No intrarenal or ureteral calculi,, are identified. There is, additionally, infiltration of the retroperitoneum with shotty adenopathy identified. The findings may be  inflammatory, as can be seen ascending urinary tract infection, or reflect infiltrative disease as can seen with lymphoma, sarcoidosis, or IgG4 related inflammatory disease. The bladder is unremarkable. Stomach/Bowel: Mild sigmoid diverticulosis. Stomach, small bowel, and large bowel are otherwise unremarkable. Appendix normal. No evidence of obstruction or focal inflammation. No free intraperitoneal gas or fluid. Vascular/Lymphatic: Mild aortoiliac atherosclerotic calcification. No frankly pathologic adenopathy by size criteria within pelvis. Reproductive: Status post hysterectomy. No adnexal masses. Other: Thinning and laxity of the abdominal wall musculature noted without frank hernia formation. Musculoskeletal: Advanced degenerative changes are seen  within the thoracolumbar spine. No acute bone abnormality. No suspicious lytic or blastic lesion. IMPRESSION: 1. Extensive perinephric, peripelvic, and periureteric soft tissue infiltration. No significant hydronephrosis. No intrarenal or ureteral calculi. The findings may be inflammatory, as can be seen ascending urinary tract infection, or reflect infiltrative disease as can seen with lymphoma, sarcoidosis, or IgG4 related inflammatory disease. Correlation with renal function tests, urinalysis and urine cytology is recommended for further evaluation 2. Mild interstitial and alveolar pulmonary edema. Mild cardiomegaly. 3. Mild sigmoid diverticulosis. 4. Aortic atherosclerosis. Aortic Atherosclerosis (ICD10-I70.0). Electronically Signed   By: Dorethia Molt M.D.   On: 11/22/2023 01:54   ECHOCARDIOGRAM COMPLETE Result Date: 11/21/2023    ECHOCARDIOGRAM REPORT   Patient Name:   [name] Date of Exam: 11/21/2023 Medical Rec #:  [id number]      Height:       61.0 in Accession #:    [id number]     Weight:       233.0 lb Date of Birth:  23-Sep-1950      BSA:          2.016 m Patient Age:    72 years       BP:           137/52 mmHg Patient Gender: F              HR:            80 bpm. Exam Location:  Inpatient Procedure: 2D Echo, Cardiac Doppler, Color Doppler and Intracardiac            Opacification Agent (Both Spectral and Color Flow Doppler were            utilized during procedure). Indications:    CHF  History:        Patient has prior history of Echocardiogram examinations, most                 recent 10/11/2016. CHF; Risk Factors:Obesity, Sleep Apnea,                 Hypertension, Diabetes and Dyslipidemia.  Sonographer:    Therisa Crouch Referring Phys: [id number] TIMOTHY S OPYD  Sonographer Comments: Image acquisition challenging due to patient body habitus. IMPRESSIONS  1. Left ventricular ejection fraction, by estimation, is 55 to 60%. The left ventricle has normal function. The left ventricle has no regional wall motion abnormalities. There is mild concentric left ventricular hypertrophy. Left ventricular diastolic parameters were normal.  2. Right ventricular systolic function is normal. The right ventricular size is normal. There is normal pulmonary artery systolic pressure. The estimated right ventricular systolic pressure is 21.3 mmHg.  3. The mitral valve is grossly normal. Trivial mitral valve regurgitation. No evidence of mitral stenosis.  4. The aortic valve is grossly normal. Aortic valve regurgitation is not visualized. No aortic stenosis is present.  5. The inferior vena cava is normal in size with greater than 50% respiratory variability, suggesting right atrial pressure of 3 mmHg. FINDINGS  Left Ventricle: Left ventricular ejection fraction, by estimation, is 55 to 60%. The left ventricle has normal function. The left ventricle has no regional wall motion abnormalities. Definity  contrast agent was given IV to delineate the left ventricular  endocardial borders. The left ventricular internal cavity size was normal in size. There is mild concentric left ventricular hypertrophy. Left ventricular diastolic parameters were normal. Right Ventricle: The right  ventricular size is normal. No increase in right ventricular wall thickness. Right ventricular systolic function  is normal. There is normal pulmonary artery systolic pressure. The tricuspid regurgitant velocity is 2.14 m/s, and  with an assumed right atrial pressure of 3 mmHg, the estimated right ventricular systolic pressure is 21.3 mmHg. Left Atrium: Left atrial size was normal in size. Right Atrium: Right atrial size was normal in size. Pericardium: There is no evidence of pericardial effusion. Presence of epicardial fat layer. Mitral Valve: The mitral valve is grossly normal. Trivial mitral valve regurgitation. No evidence of mitral valve stenosis. MV peak gradient, 7.8 mmHg. The mean mitral valve gradient is 4.0 mmHg. Tricuspid Valve: The tricuspid valve is grossly normal. Tricuspid valve regurgitation is trivial. No evidence of tricuspid stenosis. Aortic Valve: The aortic valve is grossly normal. Aortic valve regurgitation is not visualized. No aortic stenosis is present. Aortic valve mean gradient measures 6.0 mmHg. Aortic valve peak gradient measures 11.3 mmHg. Aortic valve area, by VTI measures  1.72 cm. Pulmonic Valve: The pulmonic valve was grossly normal. Pulmonic valve regurgitation is not visualized. No evidence of pulmonic stenosis. Aorta: The aortic root and ascending aorta are structurally normal, with no evidence of dilitation. Venous: The inferior vena cava is normal in size with greater than 50% respiratory variability, suggesting right atrial pressure of 3 mmHg. IAS/Shunts: No atrial level shunt detected by color flow Doppler.  LEFT VENTRICLE PLAX 2D LVIDd:         4.50 cm   Diastology LVIDs:         3.10 cm   LV e' medial:    9.79 cm/s LV PW:         1.30 cm   LV E/e' medial:  12.8 LV IVS:        1.50 cm   LV e' lateral:   7.62 cm/s LVOT diam:     2.00 cm   LV E/e' lateral: 16.4 LV SV:         58 LV SV Index:   29 LVOT Area:     3.14 cm  RIGHT VENTRICLE          IVC RV Basal diam:  3.40 cm   IVC diam: 1.40 cm TAPSE (M-mode): 1.8 cm LEFT ATRIUM             Index LA diam:        3.90 cm 1.93 cm/m LA Vol (A2C):   54.7 ml 27.14 ml/m LA Vol (A4C):   65.4 ml 32.45 ml/m LA Biplane Vol: 60.5 ml 30.02 ml/m  AORTIC VALVE AV Area (Vmax):    1.74 cm AV Area (Vmean):   1.78 cm AV Area (VTI):     1.72 cm AV Vmax:           168.00 cm/s AV Vmean:          113.000 cm/s AV VTI:            0.336 m AV Peak Grad:      11.3 mmHg AV Mean Grad:      6.0 mmHg LVOT Vmax:         93.30 cm/s LVOT Vmean:        63.900 cm/s LVOT VTI:          0.184 m LVOT/AV VTI ratio: 0.55  AORTA Ao Asc diam: 3.30 cm MITRAL VALVE                TRICUSPID VALVE MV Area (PHT): 3.99 cm     TR Peak grad:   18.3 mmHg MV Area VTI:  1.89 cm     TR Vmax:        214.00 cm/s MV Peak grad:  7.8 mmHg MV Mean grad:  4.0 mmHg     SHUNTS MV Vmax:       1.40 m/s     Systemic VTI:  0.18 m MV Vmean:      94.4 cm/s    Systemic Diam: 2.00 cm MV Decel Time: 190 msec MV E velocity: 125.00 cm/s MV A velocity: 95.40 cm/s MV E/A ratio:  1.31 Darryle Decent MD Electronically signed by Darryle Decent MD Signature Date/Time: 11/21/2023/3:32:05 PM    Final    CT Chest W Contrast Result Date: 11/20/2023 CLINICAL DATA:  Abnormal x-ray.  History of endometrial cancer. EXAM: CT CHEST WITH CONTRAST TECHNIQUE: Multidetector CT imaging of the chest was performed during intravenous contrast administration. RADIATION DOSE REDUCTION: This exam was performed according to the departmental dose-optimization program which includes automated exposure control, adjustment of the mA and/or kV according to patient size and/or use of iterative reconstruction technique. CONTRAST:  <See Chart> OMNIPAQUE  IOHEXOL  350 MG/ML SOLN, 75mL OMNIPAQUE  IOHEXOL  300 MG/ML SOLN COMPARISON:  The skip choose 1 there are atherosclerotic calcifications of the aorta. FINDINGS: Cardiovascular: There is an enlarged subcarinal lymph node measuring 1 cm. There are numerous nonenlarged paratracheal lymph nodes,  new from prior. There is an enlarged precarinal lymph node measuring 10 mm. No enlarged hilar or axillary lymph nodes are seen. Visualized esophagus and thyroid  gland are within normal limits. Mediastinum/Nodes: There are mild peripheral interstitial opacities in both lung bases. The lungs are otherwise clear. There is no pleural effusion or pneumothorax. Trachea and central airways are within normal limits. Lungs/Pleura: Lungs are clear. No pleural effusion or pneumothorax. Upper Abdomen: No acute abnormality. Musculoskeletal: Degenerative changes affect the spine. IMPRESSION: 1. Mild mediastinal lymphadenopathy, new from prior. This is nonspecific and may be reactive. 2. Mild peripheral interstitial opacities in both lung bases, nonspecific. Findings may related to edema. Aortic Atherosclerosis (ICD10-I70.0). Electronically Signed   By: Greig Pique M.D.   On: 11/20/2023 19:06   DG Chest Portable 1 View Result Date: 11/20/2023 CLINICAL DATA:  Shortness of breath, history of endometrial cancer. EXAM: PORTABLE CHEST 1 VIEW COMPARISON:  Chest radiograph March 31, 2023 FINDINGS: Increased interstitial markings of both lung fields, progressed to prior. Prominent bilateral hilum. Cardiomegaly stable to prior. Aortic knob is calcified. No pleural effusion. No acute osseous abnormality. IMPRESSION: Interval increase in interstitial marking of both lung fields which can be seen the setting of infection, pulmonary edema, interstitial lung disease, lymphangitic carcinomatosis. Recommend chest CT for further assessment if clinically warranted. Electronically Signed   By: Megan  Zare M.D.   On: 11/20/2023 17:22     Scheduled Meds:  sodium chloride    Intravenous Once   amitriptyline   50 mg Oral QHS   cefUROXime   250 mg Oral BID WC   Chlorhexidine  Gluconate Cloth  6 each Topical Daily   cyanocobalamin   1,000 mcg Intramuscular Once   felodipine   10 mg Oral Daily   furosemide   40 mg Intravenous BID   insulin   aspart  0-5 Units Subcutaneous QHS   insulin  aspart  0-9 Units Subcutaneous TID WC   insulin  glargine-yfgn  20 Units Subcutaneous Daily   levothyroxine   125 mcg Oral Q0600   pravastatin   40 mg Oral Daily   sodium chloride  flush  3 mL Intravenous Q12H   Continuous Infusions:   LOS: 2 days    Time spent:  Sigurd Pac, MD [facility]   11/22/2023, 1:32 PM

## 2023-11-22 NOTE — Progress Notes (Signed)
 Mobility Specialist Progress Note:   11/22/23 1200  Mobility  Activity Pivoted/transferred to/from [facility]  Level of Assistance Minimal assist, patient does 75% or more  Assistive Device Other (Comment) (HHA)  Distance Ambulated (ft) 3 ft  Activity Response Tolerated well  Mobility Referral Yes  Mobility visit 1 Mobility  Mobility Specialist Start Time (ACUTE ONLY) 1200  Mobility Specialist Stop Time (ACUTE ONLY) 1215  Mobility Specialist Time Calculation (min) (ACUTE ONLY) 15 min   Pt requesting to transfer to [facility]. Required light HHA to stand and pivot. No c/o throughout. Back in bed with all needs met, declined transferring to chair d/t frequent incontinence.  Therisa Rana Mobility Specialist Please contact via SecureChat or  Rehab office at [phone]

## 2023-11-22 NOTE — Plan of Care (Signed)

## 2023-11-23 ENCOUNTER — Other Ambulatory Visit (HOSPITAL_COMMUNITY): Payer: Self-pay

## 2023-11-23 DIAGNOSIS — D649 Anemia, unspecified: Secondary | ICD-10-CM | POA: Diagnosis not present

## 2023-11-23 LAB — CBC
HCT: 27.1 % — ABNORMAL LOW (ref 36.0–46.0)
Hemoglobin: 8.3 g/dL — ABNORMAL LOW (ref 12.0–15.0)
MCH: 28.1 pg (ref 26.0–34.0)
MCHC: 30.6 g/dL (ref 30.0–36.0)
MCV: 91.9 fL (ref 80.0–100.0)
Platelets: 374 K/uL (ref 150–400)
RBC: 2.95 MIL/uL — ABNORMAL LOW (ref 3.87–5.11)
RDW: 16.3 % — ABNORMAL HIGH (ref 11.5–15.5)
WBC: 9 K/uL (ref 4.0–10.5)
nRBC: 0 % (ref 0.0–0.2)

## 2023-11-23 LAB — GLUCOSE, CAPILLARY
Glucose-Capillary: 172 mg/dL — ABNORMAL HIGH (ref 70–99)
Glucose-Capillary: 331 mg/dL — ABNORMAL HIGH (ref 70–99)

## 2023-11-23 LAB — BASIC METABOLIC PANEL WITH GFR
Anion gap: 11 (ref 5–15)
BUN: 24 mg/dL — ABNORMAL HIGH (ref 8–23)
CO2: 25 mmol/L (ref 22–32)
Calcium: 8.6 mg/dL — ABNORMAL LOW (ref 8.9–10.3)
Chloride: 101 mmol/L (ref 98–111)
Creatinine, Ser: 1.61 mg/dL — ABNORMAL HIGH (ref 0.44–1.00)
GFR, Estimated: 34 mL/min — ABNORMAL LOW (ref >60)
Glucose, Bld: 178 mg/dL — ABNORMAL HIGH (ref 70–99)
Potassium: 3.7 mmol/L (ref 3.5–5.1)
Sodium: 137 mmol/L (ref 135–145)

## 2023-11-23 MED ORDER — FUROSEMIDE 40 MG PO TABS
40.0000 mg | ORAL_TABLET | Freq: Every day | ORAL | 1 refills | Status: AC
  Filled 2023-11-23: qty 30, 30d supply, fill #0

## 2023-11-23 MED ORDER — CEFDINIR 300 MG PO CAPS: 300.0000 mg | ORAL_CAPSULE | Freq: Two times a day (BID) | ORAL | Status: DC

## 2023-11-23 MED ORDER — VITAMIN B-12 1000 MCG PO TABS
1000.0000 ug | ORAL_TABLET | Freq: Every day | ORAL | 0 refills | Status: DC
  Filled 2023-11-23: qty 30, 30d supply, fill #0

## 2023-11-23 MED ORDER — POTASSIUM CHLORIDE CRYS ER 20 MEQ PO TBCR
20.0000 meq | EXTENDED_RELEASE_TABLET | Freq: Every day | ORAL | 0 refills | Status: DC
  Filled 2023-11-23: qty 30, 30d supply, fill #0

## 2023-11-23 NOTE — Progress Notes (Signed)
 DC order noted per MD. DC RN at bedside. Informed by primary RN, patient incontinent secondary to lasix . Not a candidate for DC lounge. Family expected to arrive closer to 12/1pm. Med details entered, AVS printed/reviewed. Instructed patient to f/u per Dr. Fairy recommendation with Dr. Georgina completing CT in 4-6wks, verbalized understanding. Primary RN to complete the rest of discharge.

## 2023-11-23 NOTE — Care Management Important Message (Signed)
 Important Message  Patient Details  Name: [name] MRN: [id number] Date of Birth: June 12, 1950   Important Message Given:  Yes - Medicare IM     Vonzell Arrie Sharps 11/23/2023, 10:58 AM

## 2023-11-23 NOTE — TOC Transition Note (Signed)
 Transition of Care [facility]) - Discharge Note   Patient Details  Name: [name] MRN: [id number] Date of Birth: 07/13/1950  Transition of Care [facility]) CM/SW Contact:  Waddell Barnie Rama, RN Phone Number: 11/23/2023, 10:02 AM   Clinical Narrative:    For dc today, has no needs.     Barriers to Discharge: Continued Medical Work up   Patient Goals and CMS Choice Patient states their goals for this hospitalization and ongoing recovery are:: Plan to return home once stable.   Choice offered to / list presented to : NA      Discharge Placement                       Discharge Plan and Services Additional resources added to the After Visit Summary for   In-house Referral: NA Discharge Planning Services: CM Consult Post Acute Care Choice: NA            DME Agency: NA                  Social Drivers of Health (SDOH) Interventions SDOH Screenings   Food Insecurity: Patient Declined (11/21/2023)  Housing: Unknown (11/21/2023)  Transportation Needs: No Transportation Needs (11/21/2023)  Utilities: Not At Risk (11/21/2023)  Alcohol Screen: Low Risk  (04/29/2020)  Depression (PHQ2-9): Medium Risk (12/20/2022)  Financial Resource Strain: Low Risk  (11/16/2023)  Physical Activity: Inactive (11/16/2023)  Social Connections: Unknown (11/21/2023)  Stress: No Stress Concern Present (11/16/2023)  Tobacco Use: Low Risk  (11/21/2023)     Readmission Risk Interventions     No data to display

## 2023-11-23 NOTE — Discharge Summary (Signed)
 Physician Discharge Summary  [name] FMW:[id number] DOB: [birth year] DOA: 11/20/2023  PCP: Candise Aleene DEL, MD  Admit date: 11/20/2023 Discharge date: 11/23/2023  Time spent: 45 minutes  Recommendations for Outpatient Follow-up:  Close follow-up with Dr. Olam Leonce Ada next month for repeat imaging of intra-abdominal stranding PCP in 1 week, plus check CBC and BMP at follow-up   Discharge Diagnoses:  Principal Problem: Acute on chronic anemia Abnormal CT abdomen pelvis   Cough variant asthma   OSA (obstructive sleep apnea)   HTN (hypertension)   Chronic pain   Endometrial cancer (HCC)   Insulin  dependent type 2 diabetes mellitus (HCC)   Chronic kidney disease, stage 3a (HCC)   Acute on chronic heart failure with preserved ejection fraction (HFpEF) (HCC)   Cystitis Morbid obesity   Discharge Condition: Improved  Diet recommendation: Low sodium, diabetic  Filed Weights   11/21/23 0500 11/22/23 0500 11/23/23 0500  Weight: 105.7 kg 103.5 kg 101.8 kg    History of present illness:   73/F w hypertension, type 2 diabetes mellitus, asthma, OSA, endometrial cancer status post total abdominal hysterectomy with bilateral salpingo-oophorectomy, hypothyroidism, CKD 3A, hypothyroidism, chronic HFpEF, and iron  deficiency anemia presented to the ED with increased leg swelling, orthopnea, exertional dyspnea, and outpatient blood work concerning for acute on chronic anemia and increased creatinine.  -Hemoglobin down to 6.7, admitted, transfused PRBC, started on IV Lasix  as well  Hospital Course:  Acute on chronic anemia Secondary to iron  deficiency and chronic disease -Given IV IV iron  infusion in January as well - EGD colonoscopy 5/25 noted mild erythema in the stomach antrum, nonbleeding sigmoid diverticuli, single sessile 2 mm polyp and some scant hemorrhoids -Hemoglobin improving, continue oral iron    Abnormal CT - Extensive soft tissue stranding noted in bilateral  perinephric, peripelvic soft tissue space, no hydronephrosis - Has some symptoms of UTI from last week as well, plan to treat for 10 days total considering CT findings, I am concerned about potential recurrence of her endometrial CA - Called and discussed with Dr. Leonce Ada, she would benefit from repeat CT in 4-6 weeks after completion of UTI treatment - SPEP pending   E. coli UTI, pansensitive - Continue cefuroxime , treat for 7-10 days total considering extent of CT findings as above which may not be related   Acute on chronic diastolic CHF - Echo with preserved EF, normal RV - Suspect volume overload related to worsening anemia, improved with diuresis, down 12 pounds, switch to oral Lasix  40 mg daily instead of home regimen of 20 Mg   CKD 3 Stable, monitor   Stage IIIa endometrial carcinoma -sp staging surgery, followed by chemo/XRT - See discussion above   Chronic pain Continue amitriptyline , Norco as needed  Discharge Exam: Vitals:   11/23/23 0305 11/23/23 0756  BP: (!) 154/56 (!) 156/60  Pulse: 70 80  Resp: 16 18  Temp: 97.6 F (36.4 C) 97.7 F (36.5 C)  SpO2: 90% 91%   Gen: Awake, Alert, Oriented X 3, morbidly obese HEENT: no JVD Lungs: Good air movement bilaterally, CTAB CVS: S1S2/RRR Abd: soft, Non tender, non distended, BS present Extremities: Trace edema Skin: no new rashes on exposed skin   Discharge Instructions   Discharge Instructions     Diet - low sodium heart healthy   Complete by: As directed    Diet Carb Modified   Complete by: As directed    Increase activity slowly   Complete by: As directed  Allergies as of 11/23/2023       Reactions   Adhesive [tape] Other (See Comments)   Takes patient's skin off.   Banana Diarrhea, Nausea Only   Egg-derived Products Diarrhea, Nausea Only   Latex Other (See Comments)   sensativity   Linzess [linaclotide] Diarrhea   Metformin  Diarrhea   Ozempic  (0.25 Or 0.5 Mg-dose) [semaglutide (0.25 Or  0.5mg -dos)] Diarrhea   Penicillins Rash, Other (See Comments)   Has had cephalosporins without incident   Pine Swelling, Rash   Rose Swelling, Rash        Medication List     STOP taking these medications    lisinopril  10 MG tablet Commonly known as: ZESTRIL        TAKE these medications    albuterol  (2.5 MG/3ML) 0.083% nebulizer solution Commonly known as: PROVENTIL  Take 3 mLs (2.5 mg total) by nebulization every 4 (four) hours as needed for wheezing or shortness of breath (Dx: J45.50).   albuterol  108 (90 Base) MCG/ACT inhaler Commonly known as: VENTOLIN  HFA INHALE 2 PUFFS INTO LUNGS EVERY 6 HOURS AS NEEDED FOR WHEEZING/SHORTNESS OF BREATH   amitriptyline  25 MG tablet Commonly known as: ELAVIL  Take 2 tablets (50 mg total) by mouth at bedtime.   AZO URINARY PAIN PO Take 2 tablets by mouth 3 (three) times daily as needed (urinary pain).   cefdinir  300 MG capsule Commonly known as: OMNICEF  Take 1 capsule (300 mg total) by mouth 2 (two) times daily for 3 days.   clobetasol  ointment 0.05 % Commonly known as: TEMOVATE  APPLY 1 APPLICATION TOPICALLY AS NEEDED. APPLY TO THE SKIN OF THE VULVA FOR ITCHING What changed: when to take this   cyanocobalamin  1000 MCG tablet Commonly known as: VITAMIN B12 Take 1 tablet (1,000 mcg total) by mouth daily.   diclofenac  Sodium 1 % Gel Commonly known as: Voltaren  Apply 2 g topically 4 (four) times daily. What changed:  how much to take when to take this reasons to take this   Dulera  50-5 MCG/ACT Aero Generic drug: Mometasone  Furo-Formoterol  Fum TAKE 2 PUFFS BY MOUTH TWICE A DAY   felodipine  10 MG 24 hr tablet Commonly known as: PLENDIL  TAKE 1 TABLET BY MOUTH EVERY DAY   ferrous sulfate 325 (65 FE) MG EC tablet Take 325 mg by mouth at bedtime.   fluticasone  0.05 % cream Commonly known as: CUTIVATE  APPLY TO AFFECTED AREA OF RIGHT LOWER LEG TWICE PER DAY. What changed:  how much to take how to take this when to take  this reasons to take this additional instructions   fluticasone  27.5 MCG/SPRAY nasal spray Commonly known as: VERAMYST Place 1 spray into the nose daily.   furosemide  40 MG tablet Commonly known as: LASIX  Take 1 tablet (40 mg total) by mouth daily. What changed:  medication strength how much to take   HYDROcodone -acetaminophen  10-325 MG tablet Commonly known as: NORCO 1/2 - 1 tab po bid prn pain What changed:  how much to take how to take this when to take this additional instructions   Insulin  Aspart FlexPen 100 UNIT/ML Commonly known as: NOVOLOG  NJECT 10-20 UNITS INTO THE SKIN 3 (THREE) 15 MINUTES BEFORE MEALS What changed:  how much to take how to take this when to take this additional instructions   insulin  NPH Human 100 UNIT/ML injection Commonly known as: NovoLIN  N Inject 45-50 units under skin daily as advised What changed:  how much to take how to take this when to take this additional instructions   Insulin  Pen  Needle 32G X 4 MM Misc Use 3x a day   Insulin  Syringes (Disposable) U-100 0.3 ML Misc Use to inject insulin --6 injections per day   levocetirizine 5 MG tablet Commonly known as: XYZAL Take 5 mg by mouth at bedtime.   metFORMIN  500 MG tablet Commonly known as: GLUCOPHAGE  TAKE 1 TABLET BY MOUTH EVERY MORNING AND 2 TABLETS BY MOUTH AT BEDTIME. STRENGTH: 500 MG   miconazole 2 % powder Commonly known as: MICOTIN Apply 1 application topically 2 (two) times daily as needed for itching.   omeprazole 40 MG capsule Commonly known as: PRILOSEC Take 40 mg by mouth daily.   potassium chloride  SA 20 MEQ tablet Commonly known as: KLOR-CON  M Take 1 tablet (20 mEq total) by mouth daily.   pravastatin  40 MG tablet Commonly known as: PRAVACHOL  TAKE 1 TABLET BY MOUTH EVERY DAY   PROBIOTIC PO Take 1 capsule by mouth daily.   Synthroid  125 MCG tablet Generic drug: levothyroxine  TAKE 1 TABLET (125 MCG TOTAL) BY MOUTH DAILY. OFFICE VISIT NEEDED FOR  FURTHER REFILLS       Allergies  Allergen Reactions   Adhesive [Tape] Other (See Comments)    Takes patient's skin off.   Banana Diarrhea and Nausea Only   Egg-Derived Products Diarrhea and Nausea Only   Latex Other (See Comments)    sensativity   Linzess [Linaclotide] Diarrhea   Metformin  Diarrhea   Ozempic  (0.25 Or 0.5 Mg-Dose) [Semaglutide (0.25 Or 0.5mg -Dos)] Diarrhea   Penicillins Rash and Other (See Comments)    Has had cephalosporins without incident   Pine Swelling and Rash   Rose Swelling and Rash    Follow-up Information     Dr. Olam Mill Follow up on 01/03/2024.   Why: at 9 am for follow up. Contact information: at [facility], Aleene DEL, MD Follow up.   Specialty: Family Medicine Why: December 08 2023 @1PM   ARRIVE 15 MINUTES EARLY  BRING COPAY Contact information: [address] [place] [address] [phone]                  The results of significant diagnostics from this hospitalization (including imaging, microbiology, ancillary and laboratory) are listed below for reference.    Significant Diagnostic Studies: CT ABDOMEN PELVIS W CONTRAST Result Date: 11/22/2023 CLINICAL DATA:  Anemia, endometrial cancer, * Tracking Code: BO * EXAM: CT ABDOMEN AND PELVIS WITH CONTRAST TECHNIQUE: Multidetector CT imaging of the abdomen and pelvis was performed using the standard protocol following bolus administration of intravenous contrast. RADIATION DOSE REDUCTION: This exam was performed according to the departmental dose-optimization program which includes automated exposure control, adjustment of the mA and/or kV according to patient size and/or use of iterative reconstruction technique. CONTRAST:  75mL OMNIPAQUE  IOHEXOL  350 MG/ML SOLN COMPARISON:  06/25/2020 FINDINGS: Lower chest: Mild interstitial and alveolar pulmonary edema. Mild cardiomegaly. Hepatobiliary: No focal liver abnormality is seen. Status post  cholecystectomy. No biliary dilatation. Pancreas: Moderate fatty atrophy. No superimposed acute inflammatory change or peripancreatic fluid collection. Spleen: Unremarkable Adrenals/Urinary Tract: Adrenal glands are unremarkable. The kidneys are normal in size and position there is extensive perinephric, peripelvic, and periureteric soft tissue infiltration. Delayed images, however, demonstrate no significant hydronephrosis. No intrarenal or ureteral calculi,, are identified. There is, additionally, infiltration of the retroperitoneum with shotty adenopathy identified. The findings may be inflammatory, as can be seen ascending urinary tract infection, or reflect infiltrative disease as can seen with  lymphoma, sarcoidosis, or IgG4 related inflammatory disease. The bladder is unremarkable. Stomach/Bowel: Mild sigmoid diverticulosis. Stomach, small bowel, and large bowel are otherwise unremarkable. Appendix normal. No evidence of obstruction or focal inflammation. No free intraperitoneal gas or fluid. Vascular/Lymphatic: Mild aortoiliac atherosclerotic calcification. No frankly pathologic adenopathy by size criteria within pelvis. Reproductive: Status post hysterectomy. No adnexal masses. Other: Thinning and laxity of the abdominal wall musculature noted without frank hernia formation. Musculoskeletal: Advanced degenerative changes are seen within the thoracolumbar spine. No acute bone abnormality. No suspicious lytic or blastic lesion. IMPRESSION: 1. Extensive perinephric, peripelvic, and periureteric soft tissue infiltration. No significant hydronephrosis. No intrarenal or ureteral calculi. The findings may be inflammatory, as can be seen ascending urinary tract infection, or reflect infiltrative disease as can seen with lymphoma, sarcoidosis, or IgG4 related inflammatory disease. Correlation with renal function tests, urinalysis and urine cytology is recommended for further evaluation 2. Mild interstitial and  alveolar pulmonary edema. Mild cardiomegaly. 3. Mild sigmoid diverticulosis. 4. Aortic atherosclerosis. Aortic Atherosclerosis (ICD10-I70.0). Electronically Signed   By: Dorethia Molt M.D.   On: 11/22/2023 01:54   ECHOCARDIOGRAM COMPLETE Result Date: 11/21/2023    ECHOCARDIOGRAM REPORT   Patient Name:   [name] Date of Exam: 11/21/2023 Medical Rec #:  [id number]      Height:       61.0 in Accession #:    [id number]     Weight:       233.0 lb Date of Birth:  April 06, 1951      BSA:          2.016 m Patient Age:    72 years       BP:           137/52 mmHg Patient Gender: F              HR:           80 bpm. Exam Location:  Inpatient Procedure: 2D Echo, Cardiac Doppler, Color Doppler and Intracardiac            Opacification Agent (Both Spectral and Color Flow Doppler were            utilized during procedure). Indications:    CHF  History:        Patient has prior history of Echocardiogram examinations, most                 recent 10/11/2016. CHF; Risk Factors:Obesity, Sleep Apnea,                 Hypertension, Diabetes and Dyslipidemia.  Sonographer:    Therisa Crouch Referring Phys: [id number] TIMOTHY S OPYD  Sonographer Comments: Image acquisition challenging due to patient body habitus. IMPRESSIONS  1. Left ventricular ejection fraction, by estimation, is 55 to 60%. The left ventricle has normal function. The left ventricle has no regional wall motion abnormalities. There is mild concentric left ventricular hypertrophy. Left ventricular diastolic parameters were normal.  2. Right ventricular systolic function is normal. The right ventricular size is normal. There is normal pulmonary artery systolic pressure. The estimated right ventricular systolic pressure is 21.3 mmHg.  3. The mitral valve is grossly normal. Trivial mitral valve regurgitation. No evidence of mitral stenosis.  4. The aortic valve is grossly normal. Aortic valve regurgitation is not visualized. No aortic stenosis is present.  5. The inferior vena  cava is normal in size with greater than 50% respiratory variability, suggesting right atrial pressure of 3 mmHg. FINDINGS  Left Ventricle: Left  ventricular ejection fraction, by estimation, is 55 to 60%. The left ventricle has normal function. The left ventricle has no regional wall motion abnormalities. Definity  contrast agent was given IV to delineate the left ventricular  endocardial borders. The left ventricular internal cavity size was normal in size. There is mild concentric left ventricular hypertrophy. Left ventricular diastolic parameters were normal. Right Ventricle: The right ventricular size is normal. No increase in right ventricular wall thickness. Right ventricular systolic function is normal. There is normal pulmonary artery systolic pressure. The tricuspid regurgitant velocity is 2.14 m/s, and  with an assumed right atrial pressure of 3 mmHg, the estimated right ventricular systolic pressure is 21.3 mmHg. Left Atrium: Left atrial size was normal in size. Right Atrium: Right atrial size was normal in size. Pericardium: There is no evidence of pericardial effusion. Presence of epicardial fat layer. Mitral Valve: The mitral valve is grossly normal. Trivial mitral valve regurgitation. No evidence of mitral valve stenosis. MV peak gradient, 7.8 mmHg. The mean mitral valve gradient is 4.0 mmHg. Tricuspid Valve: The tricuspid valve is grossly normal. Tricuspid valve regurgitation is trivial. No evidence of tricuspid stenosis. Aortic Valve: The aortic valve is grossly normal. Aortic valve regurgitation is not visualized. No aortic stenosis is present. Aortic valve mean gradient measures 6.0 mmHg. Aortic valve peak gradient measures 11.3 mmHg. Aortic valve area, by VTI measures  1.72 cm. Pulmonic Valve: The pulmonic valve was grossly normal. Pulmonic valve regurgitation is not visualized. No evidence of pulmonic stenosis. Aorta: The aortic root and ascending aorta are structurally normal, with no evidence  of dilitation. Venous: The inferior vena cava is normal in size with greater than 50% respiratory variability, suggesting right atrial pressure of 3 mmHg. IAS/Shunts: No atrial level shunt detected by color flow Doppler.  LEFT VENTRICLE PLAX 2D LVIDd:         4.50 cm   Diastology LVIDs:         3.10 cm   LV e' medial:    9.79 cm/s LV PW:         1.30 cm   LV E/e' medial:  12.8 LV IVS:        1.50 cm   LV e' lateral:   7.62 cm/s LVOT diam:     2.00 cm   LV E/e' lateral: 16.4 LV SV:         58 LV SV Index:   29 LVOT Area:     3.14 cm  RIGHT VENTRICLE          IVC RV Basal diam:  3.40 cm  IVC diam: 1.40 cm TAPSE (M-mode): 1.8 cm LEFT ATRIUM             Index LA diam:        3.90 cm 1.93 cm/m LA Vol (A2C):   54.7 ml 27.14 ml/m LA Vol (A4C):   65.4 ml 32.45 ml/m LA Biplane Vol: 60.5 ml 30.02 ml/m  AORTIC VALVE AV Area (Vmax):    1.74 cm AV Area (Vmean):   1.78 cm AV Area (VTI):     1.72 cm AV Vmax:           168.00 cm/s AV Vmean:          113.000 cm/s AV VTI:            0.336 m AV Peak Grad:      11.3 mmHg AV Mean Grad:      6.0 mmHg LVOT Vmax:  93.30 cm/s LVOT Vmean:        63.900 cm/s LVOT VTI:          0.184 m LVOT/AV VTI ratio: 0.55  AORTA Ao Asc diam: 3.30 cm MITRAL VALVE                TRICUSPID VALVE MV Area (PHT): 3.99 cm     TR Peak grad:   18.3 mmHg MV Area VTI:   1.89 cm     TR Vmax:        214.00 cm/s MV Peak grad:  7.8 mmHg MV Mean grad:  4.0 mmHg     SHUNTS MV Vmax:       1.40 m/s     Systemic VTI:  0.18 m MV Vmean:      94.4 cm/s    Systemic Diam: 2.00 cm MV Decel Time: 190 msec MV E velocity: 125.00 cm/s MV A velocity: 95.40 cm/s MV E/A ratio:  1.31 Darryle Decent MD Electronically signed by Darryle Decent MD Signature Date/Time: 11/21/2023/3:32:05 PM    Final    CT Chest W Contrast Result Date: 11/20/2023 CLINICAL DATA:  Abnormal x-ray.  History of endometrial cancer. EXAM: CT CHEST WITH CONTRAST TECHNIQUE: Multidetector CT imaging of the chest was performed during intravenous contrast  administration. RADIATION DOSE REDUCTION: This exam was performed according to the departmental dose-optimization program which includes automated exposure control, adjustment of the mA and/or kV according to patient size and/or use of iterative reconstruction technique. CONTRAST:  <See Chart> OMNIPAQUE  IOHEXOL  350 MG/ML SOLN, 75mL OMNIPAQUE  IOHEXOL  300 MG/ML SOLN COMPARISON:  The skip choose 1 there are atherosclerotic calcifications of the aorta. FINDINGS: Cardiovascular: There is an enlarged subcarinal lymph node measuring 1 cm. There are numerous nonenlarged paratracheal lymph nodes, new from prior. There is an enlarged precarinal lymph node measuring 10 mm. No enlarged hilar or axillary lymph nodes are seen. Visualized esophagus and thyroid  gland are within normal limits. Mediastinum/Nodes: There are mild peripheral interstitial opacities in both lung bases. The lungs are otherwise clear. There is no pleural effusion or pneumothorax. Trachea and central airways are within normal limits. Lungs/Pleura: Lungs are clear. No pleural effusion or pneumothorax. Upper Abdomen: No acute abnormality. Musculoskeletal: Degenerative changes affect the spine. IMPRESSION: 1. Mild mediastinal lymphadenopathy, new from prior. This is nonspecific and may be reactive. 2. Mild peripheral interstitial opacities in both lung bases, nonspecific. Findings may related to edema. Aortic Atherosclerosis (ICD10-I70.0). Electronically Signed   By: Greig Pique M.D.   On: 11/20/2023 19:06   DG Chest Portable 1 View Result Date: 11/20/2023 CLINICAL DATA:  Shortness of breath, history of endometrial cancer. EXAM: PORTABLE CHEST 1 VIEW COMPARISON:  Chest radiograph March 31, 2023 FINDINGS: Increased interstitial markings of both lung fields, progressed to prior. Prominent bilateral hilum. Cardiomegaly stable to prior. Aortic knob is calcified. No pleural effusion. No acute osseous abnormality. IMPRESSION: Interval increase in interstitial  marking of both lung fields which can be seen the setting of infection, pulmonary edema, interstitial lung disease, lymphangitic carcinomatosis. Recommend chest CT for further assessment if clinically warranted. Electronically Signed   By: Megan  Zare M.D.   On: 11/20/2023 17:22    Microbiology: Recent Results (from the past 240 hours)  Urine Culture     Status: Abnormal   Collection Time: 11/17/23  1:32 PM  Result Value Ref Range Status   MICRO NUMBER: [id number]  Final   SPECIMEN QUALITY: Adequate  Final   Sample Source URINE  Final   STATUS:  FINAL  Final   ISOLATE 1: Escherichia coli (A)  Final    Comment: 50,000-100,000 CFU/mL of Escherichia coli      Susceptibility   Escherichia coli - URINE CULTURE, REFLEX    AMOX/CLAVULANIC <=2 Sensitive     AMPICILLIN/SULBACTAM <=2 Sensitive     CEFAZOLIN * 2 Not Reportable      * For infections other than uncomplicated UTI caused by E. coli, K. pneumoniae or P. mirabilis: Cefazolin  is resistant if MIC > or = 8 mcg/mL. (Distinguishing susceptible versus intermediate for isolates with MIC < or = 4 mcg/mL requires additional testing.) For uncomplicated UTI caused by E. coli, K. pneumoniae or P. mirabilis: Cefazolin  is susceptible if MIC <32 mcg/mL and predicts susceptible to the oral agents cefaclor, cefdinir , cefpodoxime, cefprozil, cefuroxime , cephalexin  and loracarbef.     CEFTAZIDIME <=0.5 Sensitive     CEFEPIME <=0.12 Sensitive     CEFTRIAXONE <=0.25 Sensitive     CIPROFLOXACIN  <=0.06 Sensitive     LEVOFLOXACIN  <=0.12 Sensitive     GENTAMICIN <=1 Sensitive     IMIPENEM <=0.25 Sensitive     MEROPENEM <=0.25 Sensitive     NITROFURANTOIN  <=16 Sensitive     PIP/TAZO <=4 Sensitive     TRIMETH /SULFA * <=20 Sensitive      * For infections other than uncomplicated UTI caused by E. coli, K. pneumoniae or P. mirabilis: Cefazolin  is resistant if MIC > or = 8 mcg/mL. (Distinguishing susceptible versus intermediate for isolates with MIC < or  = 4 mcg/mL requires additional testing.) For uncomplicated UTI caused by E. coli, K. pneumoniae or P. mirabilis: Cefazolin  is susceptible if MIC <32 mcg/mL and predicts susceptible to the oral agents cefaclor, cefdinir , cefpodoxime, cefprozil, cefuroxime , cephalexin  and loracarbef. Legend: S = Susceptible  I = Intermediate R = Resistant  NS = Not susceptible SDD = Susceptible Dose Dependent * = Not Tested  NR = Not Reported **NN = See Therapy Comments   MRSA Next Gen by PCR, Nasal     Status: None   Collection Time: 11/21/23  1:11 AM   Specimen: Nasal Mucosa; Nasal Swab  Result Value Ref Range Status   MRSA by PCR Next Gen NOT DETECTED NOT DETECTED Final    Comment: (NOTE) The GeneXpert MRSA Assay (FDA approved for NASAL specimens only), is one component of a comprehensive MRSA colonization surveillance program. It is not intended to diagnose MRSA infection nor to guide or monitor treatment for MRSA infections. Test performance is not FDA approved in patients less than 86 years old. Performed at [facility], [address], [place], [place] [address]      Labs: Basic Metabolic Panel: Recent Labs  Lab 11/17/23 1442 11/20/23 1525 11/21/23 0548 11/22/23 0214 11/23/23 0302  NA 137 139 137 138 137  K 4.6 4.3 4.1 3.9 3.7  CL 102 105 106 103 101  CO2 23 20* 23 25 25   GLUCOSE 84 187* 177* 231* 178*  BUN 35* 31* 27* 23 24*  CREATININE 1.63* 1.44* 1.39* 1.29* 1.61*  CALCIUM 8.6 8.7* 8.3* 8.5* 8.6*  MG  --   --  1.5*  --   --    Liver Function Tests: Recent Labs  Lab 11/17/23 1442 11/20/23 1525  AST 11 17  ALT 8 6  ALKPHOS  --  70  BILITOT 0.2 0.2  PROT 7.2 7.2  ALBUMIN  --  3.2*   No results for input(s): LIPASE, AMYLASE in the last 168 hours. No results for input(s): AMMONIA in  the last 168 hours. CBC: Recent Labs  Lab 11/17/23 1442 11/20/23 1525 11/21/23 0548 11/22/23 0214 11/23/23 0302  WBC 9.7 8.4 7.4 8.0 9.0  NEUTROABS 6,936 5.8  --    --   --   HGB 6.7* 6.7* 6.5* 7.8* 8.3*  HCT 22.2* 22.9* 21.8* 25.5* 27.1*  MCV 92.1 94.2 93.6 92.1 91.9  PLT 307 337 324 334 374   Cardiac Enzymes: No results for input(s): CKTOTAL, CKMB, CKMBINDEX, TROPONINI in the last 168 hours. BNP: BNP (last 3 results) No results for input(s): BNP in the last 8760 hours.  ProBNP (last 3 results) Recent Labs    11/20/23 1540  PROBNP 983.0*    CBG: Recent Labs  Lab 11/22/23 1040 11/22/23 1647 11/22/23 2117 11/23/23 0615 11/23/23 1105  GLUCAP 254* 261* 297* 172* 331*       Signed:  Sigurd Pac MD.  [facility] 11/23/2023, 12:30 PM

## 2023-11-23 NOTE — Progress Notes (Signed)
 Mobility Specialist Progress Note:    11/23/23 1207  Mobility  Activity Pivoted/transferred to/from [facility]  Level of Assistance Minimal assist, patient does 75% or more  Assistive Device Other (Comment) (HHA)  Distance Ambulated (ft) 3 ft  Activity Response Tolerated well  Mobility Referral Yes  Mobility visit 1 Mobility  Mobility Specialist Start Time (ACUTE ONLY) 1207  Mobility Specialist Stop Time (ACUTE ONLY) 1214  Mobility Specialist Time Calculation (min) (ACUTE ONLY) 7 min   Received pt sitting on EOB waiting for transport. Agreeable and pleasant for assistance to [facility]. Pt needing very little assist to help take steps over. No c/o any symptoms. Left pt in room w/ husband and all needs met.   Venetia Keel Mobility Specialist Please Neurosurgeon or Rehab Office at [phone]

## 2023-11-23 NOTE — Plan of Care (Signed)

## 2023-11-24 ENCOUNTER — Telehealth: Payer: Self-pay

## 2023-11-24 NOTE — Transitions of Care (Post Inpatient/ED Visit) (Signed)
   11/24/2023  Name: [name] MRN: [id number] DOB: 04-May-1950  Today's TOC FU Call Status: Today's TOC FU Call Status:: Unsuccessful Call (1st Attempt) Unsuccessful Call (1st Attempt) Date: 11/24/23  Attempted to reach the patient regarding the most recent Inpatient/ED visit.  Follow Up Plan: Additional outreach attempts will be made to reach the patient to complete the Transitions of Care (Post Inpatient/ED visit) call.   Alan Ee, RN, BSN, CEN [employer]- Transition of Care Team.  [facility] [phone]

## 2023-11-27 ENCOUNTER — Telehealth: Payer: Self-pay

## 2023-11-27 LAB — METHYLMALONIC ACID, SERUM: Methylmalonic Acid, Quantitative: 298 nmol/L (ref 0–378)

## 2023-11-27 NOTE — Transitions of Care (Post Inpatient/ED Visit) (Signed)
 11/27/2023  Name: [name] MRN: [id number] DOB: Feb 12, 1951  Today's TOC FU Call Status: Today's TOC FU Call Status:: Successful TOC FU Call Completed TOC FU Call Complete Date: 11/27/23 Patient's Name and Date of Birth confirmed.  Transition Care Management Follow-up Telephone Call Primary Inpatient Discharge Diagnosis:: Acute on chronic anemia. How have you been since you were released from the hospital?: Better Any questions or concerns?: No  Items Reviewed: Did you receive and understand the discharge instructions provided?: Yes Medications obtained,verified, and reconciled?: Yes (Medications Reviewed) Any new allergies since your discharge?: No Dietary orders reviewed?: Yes Type of Diet Ordered:: low carb Do you have support at home?: Yes People in Home [RPT]: spouse, child(ren), adult  Medications Reviewed Today: Medications Reviewed Today     Reviewed by Rumalda Alan PENNER, RN (Registered Nurse) on 11/27/23 at 1528  Med List Status: <None>   Medication Order Taking? Sig Documenting Provider Last Dose Status Informant  albuterol  (PROVENTIL ) (2.5 MG/3ML) 0.083% nebulizer solution [id number] Yes Take 3 mLs (2.5 mg total) by nebulization every 4 (four) hours as needed for wheezing or shortness of breath (Dx: J45.50). McGowen, Aleene DEL, MD  Active Self, Pharmacy Records  albuterol  (VENTOLIN  HFA) 108 [phone] Base) MCG/ACT inhaler [id number] Yes INHALE 2 PUFFS INTO LUNGS EVERY 6 HOURS AS NEEDED FOR WHEEZING/SHORTNESS OF BREATH McGowen, Aleene DEL, MD  Active Self, Pharmacy Records  amitriptyline  (ELAVIL ) 25 MG tablet [id number] Yes Take 2 tablets (50 mg total) by mouth at bedtime. Candise Aleene DEL, MD  Active Self, Pharmacy Records  clobetasol  ointment (TEMOVATE ) 0.05 % [id number] Yes APPLY 1 APPLICATION TOPICALLY AS NEEDED. APPLY TO THE SKIN OF THE VULVA FOR ITCHING Cross, Melissa D, NP  Active Self, Pharmacy Records  cyanocobalamin  (VITAMIN B12) 1000 MCG tablet [id number] Yes Take 1 tablet  (1,000 mcg total) by mouth daily. Fairy Frames, MD  Active   diclofenac  Sodium (VOLTAREN ) 1 % GEL [id number] Yes Apply 2 g topically 4 (four) times daily. McGowen, Aleene DEL, MD  Active Self, Pharmacy Records  felodipine  (PLENDIL ) 10 MG 24 hr tablet [id number] Yes TAKE 1 TABLET BY MOUTH EVERY DAY McGowen, Aleene DEL, MD  Active Self, Pharmacy Records  ferrous sulfate 325 (65 FE) MG EC tablet [id number] Yes Take 325 mg by mouth at bedtime. [provider]  Active Self, Pharmacy Records  fluticasone  (CUTIVATE ) 0.05 % cream [id number] Yes APPLY TO AFFECTED AREA OF RIGHT LOWER LEG TWICE PER DAY. McGowen, Philip H, MD  Active Self, Pharmacy Records           Med Note (CRUTHIS, CHLOE C   Tue Nov 21, 2023  7:21 AM)    fluticasone  (VERAMYST) 27.5 MCG/SPRAY nasal spray [id number] Yes Place 1 spray into the nose daily. [provider]  Active Self, Pharmacy Records  furosemide  (LASIX ) 40 MG tablet [id number] Yes Take 1 tablet (40 mg total) by mouth daily. Fairy Frames, MD  Active   HYDROcodone -acetaminophen  [facility]) 10-325 MG tablet [id number] Yes 1/2 - 1 tab po bid prn pain McGowen, Aleene DEL, MD  Active Self, Pharmacy Records  Insulin  Aspart FlexPen (NOVOLOG ) 100 UNIT/ML [id number] Yes NJECT 10-20 UNITS INTO THE SKIN 3 (THREE) 15 MINUTES BEFORE MEALS Trixie File, MD  Active Self, Pharmacy Records           Med Note (CRUTHIS, CHLOE C   Tue Nov 21, 2023  1:15 PM) Pt is adamant she is taking both Novolog  and Novolin  N TID.   insulin  NPH Human (NOVOLIN  N) 100 UNIT/ML  injection [id number] Yes Inject 45-50 units under skin daily as advised Trixie File, MD  Active Self, Pharmacy Records           Med Note (CRUTHIS, CHLOE C   Tue Nov 21, 2023  1:15 PM) Pt is adamant she is taking both Novolog  and Novolin  N TID.  Insulin  Pen Needle 32G X 4 MM MISC [id number] Yes Use 3x a day Trixie File, MD  Active Self, Pharmacy Records  Insulin  Syringes, Disposable, U-100 0.3 ML MISC [id number] Yes Use to  inject insulin --6 injections per day McGowen, Aleene DEL, MD  Active Self, Pharmacy Records  levocetirizine (XYZAL) 5 MG tablet [id number] Yes Take 5 mg by mouth at bedtime. [provider]  Active Self, Pharmacy Records  levothyroxine  (SYNTHROID ) 125 MCG tablet [id number] Yes TAKE 1 TABLET (125 MCG TOTAL) BY MOUTH DAILY. OFFICE VISIT NEEDED FOR FURTHER REFILLS McGowen, Aleene DEL, MD  Active Self, Pharmacy Records  metFORMIN  (GLUCOPHAGE ) 500 MG tablet [id number] Yes TAKE 1 TABLET BY MOUTH EVERY MORNING AND 2 TABLETS BY MOUTH AT BEDTIME. STRENGTH: 500 MG Trixie File, MD  Active Self, Pharmacy Records           Med Note (CRUTHIS, CHLOE C   Tue Nov 21, 2023  7:21 AM)    miconazole (MICOTIN) 2 % powder [id number] Yes Apply 1 application topically 2 (two) times daily as needed for itching. [provider]  Active Self, Pharmacy Records  Mometasone  Furo-Formoterol  Fum (DULERA ) 50-5 MCG/ACT AERO [id number]  TAKE 2 PUFFS BY MOUTH TWICE A DAY  Patient not taking: Reported on 11/27/2023   McGowen, Philip H, MD  Active Self, Pharmacy Records  omeprazole (PRILOSEC) 40 MG capsule [id number] Yes Take 40 mg by mouth daily. [provider]  Active Self, Pharmacy Records  Phenazopyridine HCl (AZO URINARY PAIN PO) [id number] Yes Take 2 tablets by mouth 3 (three) times daily as needed (urinary pain). [provider]  Active Self, Pharmacy Records  potassium chloride  SA (KLOR-CON  M) 20 MEQ tablet [id number] Yes Take 1 tablet (20 mEq total) by mouth daily. Fairy Frames, MD  Active   pravastatin  (PRAVACHOL ) 40 MG tablet [id number] Yes TAKE 1 TABLET BY MOUTH EVERY DAY McGowen, Aleene DEL, MD  Active Self, Pharmacy Records  Probiotic Product (PROBIOTIC PO) [id number] Yes Take 1 capsule by mouth daily. [provider]  Active Self, Pharmacy Records            Home Care and Equipment/Supplies: Were Home Health Services Ordered?: No Any new equipment or medical supplies ordered?:  No  Functional Questionnaire: Do you need assistance with bathing/showering or dressing?: No Do you need assistance with meal preparation?: No Do you need assistance with eating?: No Do you have difficulty maintaining continence: No Do you need assistance with getting out of bed/getting out of a chair/moving?: No Do you have difficulty managing or taking your medications?: No  Follow up appointments reviewed: PCP Follow-up appointment confirmed?: Yes Date of PCP follow-up appointment?: 12/01/23 Follow-up Provider: [facility] Follow-up appointment confirmed?: Yes Date of Specialist follow-up appointment?: 01/03/24 Follow-Up Specialty Provider:: Thurl Ada Do you need transportation to your follow-up appointment?: No Do you understand care options if your condition(s) worsen?: Yes-patient verbalized understanding  SDOH Interventions Today    Flowsheet Row Most Recent Value  SDOH Interventions   Food Insecurity Interventions Intervention Not Indicated  Housing Interventions Intervention Not Indicated  Transportation Interventions Intervention Not Indicated  Utilities Interventions Intervention Not Indicated    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 Reports she is feeling much better after blood transfusion, iron  transfusion and losing 11 pounds of fluid.  Reports she is managing her medications without difficulty..    Reviewed signs and symptoms of a UTI. Reviewed when to call MD Reviewed DM and treatment for low and high CBG. Reviewed and offer 30 day TOC program and patient declined.  Provided my contact information for patient to call me if needed.  Alan Ee, RN, BSN, CEN [employer]- Transition of Care Team.  [facility] [phone]

## 2023-12-01 ENCOUNTER — Ambulatory Visit
Admission: RE | Admit: 2023-12-01 | Discharge: 2023-12-01 | Disposition: A | Discharge status: Home / Self Care | Source: Ambulatory Visit | Attending: Family Medicine | Admitting: Family Medicine

## 2023-12-01 ENCOUNTER — Other Ambulatory Visit: Payer: Self-pay | Admitting: Family Medicine

## 2023-12-01 ENCOUNTER — Encounter: Payer: Self-pay | Admitting: Family Medicine

## 2023-12-01 ENCOUNTER — Encounter: Payer: Self-pay | Admitting: Hematology & Oncology

## 2023-12-01 ENCOUNTER — Ambulatory Visit: Admitting: Family Medicine

## 2023-12-01 VITALS — BP 114/64 | HR 94 | Temp 97.7°F | Ht 61.0 in | Wt 219.8 lb

## 2023-12-01 DIAGNOSIS — N3000 Acute cystitis without hematuria: Secondary | ICD-10-CM | POA: Diagnosis not present

## 2023-12-01 DIAGNOSIS — E113299 Type 2 diabetes mellitus with mild nonproliferative diabetic retinopathy without macular edema, unspecified eye: Secondary | ICD-10-CM | POA: Diagnosis not present

## 2023-12-01 DIAGNOSIS — Z1231 Encounter for screening mammogram for malignant neoplasm of breast: Secondary | ICD-10-CM

## 2023-12-01 DIAGNOSIS — Z794 Long term (current) use of insulin: Secondary | ICD-10-CM

## 2023-12-01 DIAGNOSIS — Z7984 Long term (current) use of oral hypoglycemic drugs: Secondary | ICD-10-CM | POA: Diagnosis not present

## 2023-12-01 DIAGNOSIS — D649 Anemia, unspecified: Secondary | ICD-10-CM

## 2023-12-01 DIAGNOSIS — E119 Type 2 diabetes mellitus without complications: Secondary | ICD-10-CM

## 2023-12-01 DIAGNOSIS — I5031 Acute diastolic (congestive) heart failure: Secondary | ICD-10-CM

## 2023-12-01 LAB — CBC WITH DIFFERENTIAL/PLATELET
Basophils Absolute: 0.1 K/uL (ref 0.0–0.1)
Basophils Relative: 0.7 % (ref 0.0–3.0)
Eosinophils Absolute: 0.7 K/uL (ref 0.0–0.7)
Eosinophils Relative: 7.9 % — ABNORMAL HIGH (ref 0.0–5.0)
HCT: 30.9 % — ABNORMAL LOW (ref 36.0–46.0)
Hemoglobin: 9.7 g/dL — ABNORMAL LOW (ref 12.0–15.0)
Lymphocytes Relative: 13.5 % (ref 12.0–46.0)
Lymphs Abs: 1.3 K/uL (ref 0.7–4.0)
MCHC: 31.5 g/dL (ref 30.0–36.0)
MCV: 90.3 fl (ref 78.0–100.0)
Monocytes Absolute: 0.9 K/uL (ref 0.1–1.0)
Monocytes Relative: 9.2 % (ref 3.0–12.0)
Neutro Abs: 6.4 K/uL (ref 1.4–7.7)
Neutrophils Relative %: 68.7 % (ref 43.0–77.0)
Platelets: 347 K/uL (ref 150.0–400.0)
RBC: 3.42 Mil/uL — ABNORMAL LOW (ref 3.87–5.11)
RDW: 18.6 % — ABNORMAL HIGH (ref 11.5–15.5)
WBC: 9.3 K/uL (ref 4.0–10.5)

## 2023-12-01 LAB — BASIC METABOLIC PANEL WITH GFR
BUN: 44 mg/dL — ABNORMAL HIGH (ref 6–23)
CO2: 29 meq/L (ref 19–32)
Calcium: 9.4 mg/dL (ref 8.4–10.5)
Chloride: 102 meq/L (ref 96–112)
Creatinine, Ser: 1.46 mg/dL — ABNORMAL HIGH (ref 0.40–1.20)
GFR: 35.64 mL/min — ABNORMAL LOW (ref >60.00)
Glucose, Bld: 100 mg/dL — ABNORMAL HIGH (ref 70–99)
Potassium: 5 meq/L (ref 3.5–5.1)
Sodium: 141 meq/L (ref 135–145)

## 2023-12-01 NOTE — Progress Notes (Signed)
 12/11/2023  CC:  Chief Complaint  Patient presents with   Medical Management of Chronic Issues    Patient is a 73 y.o.  female who presents for  hospital follow up, specifically Transitional Care Services face-to-face visit. Dates hospitalized: 8/11 to 11/23/2023 Days since d/c from hospital: 8 days Patient was discharged from hospital to home. Reason for admission to hospital: Acute on chronic anemia, acute on chronic heart failure with preserved ejection fraction. Date of interactive (phone) contact with patient and/or caregiver: 11/27/23  I have reviewed patient's discharge summary plus pertinent specific notes, labs, and imaging from the hospitalization.  She was showing progressive signs of volume overload and hemoglobin was down to 6.7 on initial evaluation in the emergency department.  In the hospital she received RBC transfusion and diuretic.  No new endoscopic procedures were needed (EGD and colonoscopy 08/2023 noted mild erythema in the stomach antrum, nonbleeding sigmoid diverticuli, single sessile 2 mm polyp and some scant hemorrhoids). Hemoglobin was stable/improving prior to discharge and she was to continue oral iron .  She had some UTI symptoms in the preceding week, was put on antibiotics. An abdominal CT was performed and it showed extensive soft tissue stranding in the bilateral perinephric and peripelvic soft tissue space, no hydronephrosis. She was to be treated for 10 days with antibiotics for her UTI (pansensitive E. coli).  And instructed to follow-up with Dr. Leonce more where she would possibly get a repeat CT scan.   Her echocardiogram showed preserved ejection fraction, normal RV.  Her volume overload was suspected to be related to worsening anemia.  It improved with diuresis of 12 pounds.  She was switched to oral Lasix  40 mg daily instead of her usual home regimen of 20 mg daily.   Currently:  Feeling much better.  No shortness of breath. No urinary  complaints.   Discharge medication list: Albuterol , amitriptyline  50 mg nightly, Azo, cefdinir  300 mg twice daily for 3 days, Dulera  50-5, 2 puffs twice daily, felodipine  10 mg a day, ferrous sulfate 325 mg daily, furosemide  40 mg a day, Vicodin 10-325 as needed, NovoLog  10 to 20 units subcu 3 times daily with meals, NPH insulin  45 to 50 units morning and evening, Xyzal 5 mg daily, metformin  500 mg morning and 1000 mg at bedtime, omeprazole 40 mg a day, potassium chloride  20 mEq daily, pravastatin  40 mg daily, Synthroid  125 mcg daily. Medication reconciliation was done today and patient is taking meds as recommended by discharging hospitalist/specialist.    Of systems: No fever, no wheezing, no dizziness, no headaches, no cough/congestion, no chest pain, no abdominal pain. No nausea, vomiting, or diarrhea. No rash.  PMH:  Past Medical History:  Diagnosis Date   Allergic rhinitis    Allergy testing: results pending as of 05/28/16 (Dr. Fleeta Smock)   Anemia 10/2020   anemia of chronic dz (? + mild IDA?--iron  started   Arthritis    Chemotherapy-induced neuropathy (HCC) 2019   Cough variant asthma    DDD (degenerative disc disease), lumbar    Diabetes mellitus with complication (HCC)    Mild nonprolif DR R eye 12/2017-->referred to retinal specialist DM MANAGED BY DR. GHERGHE AS OF 2021   Endometrial cancer (HCC) 02/2018   REMISSION AS OF 08/2018.  Stage III (T3, Nx, Mx)  Path: endometroid adenocarcinoma involving cervix, uterus, and fallopian tubes (Pt got TAH & BSO 02/2018). No sign of recurrence as of onc f/u 05/2019.   Endometrial hyperplasia 07/02/2015   Treated with D&C and  then Mirena IUD x 5 years.  IUD removed 3/17.     GERD (gastroesophageal reflux disease)    Hemorrhoids    Herpes zoster 10/2015   L side belt-line   History of adenomatous polyp of colon 02/25/2013   5 mm cecal polyp removed by Dr. Alfrieda 5 yrs   History of blood transfusion    History of cellulitis 08/2010    Left (Since replacemnt of Left knee   History of radiation therapy 05/21/2018   [facility] brachytherapy 04/24/2018-05/21/2018  Dr Lynwood Nasuti   Hyperkalemia 03/2018   started after pt started getting chemo-->had to stop enalapril .   Hyperlipidemia    Not on statin b/c lipids stayed down when sugars came down per pt.  She says Dr. Tommas knows she is not on statin anymore.   Hypertension    Hypothyroidism    IDA (iron  deficiency anemia)    GI ref 04/2023   Moderate persistent asthma    Nephrolithiasis 07/2005   Obesity    OSA on CPAP    Osteoarthritis    knees, ankle, + ? scapholunate ligament disruption (x-ray 06/2017).  Progressive arthritis L wrist and hand 09/2022 radiographs   Pancytopenia due to antineoplastic chemotherapy [facility])    progressive as of 06/2018. Stable 08/2018.   Recurrent UTI    started cipro  250 qd 09/2018   Sciatica of left side    Severe persistent asthma    cough-variant--saw Allergist 05/25/16 and was switched from max dose advair to symbicort .   Systolic murmur    ECHO fine 10/2016-->murmur likely flow murmur assoc with HTN.   Zoon's vulvitis    Bx-proven (GYN) -lichen sclerosis.  Clobetasol  0.05% ointment per GYN    PSH:  Past Surgical History:  Procedure Laterality Date   ANKLE FRACTURE SURGERY  1984   Pin & repair   ARTHROSCOPIC REPAIR ACL  2000   Due to ACL tear   ARTHROSCOPIC REPAIR ACL  08/2006   Blateral knee rerlacements x4 2 times each knee     BREAST BIOPSY Right 2014   Benign   CARDIAC CATHETERIZATION  08/2005   clear vessel mild mitral    CARPAL TUNNEL RELEASE Right [id number];[id number]   1989 Left   CESAREAN SECTION  1985   CHOLECYSTECTOMY OPEN  1988   COLONOSCOPY N/A 02/25/2013   Tubular adenoma x 1: Recall 5 yrs. Procedure: COLONOSCOPY;  Surgeon: Renaye Sous, MD;  Location: [facility];  Service: Endoscopy;  Laterality: N/A;   COLONOSCOPY     08/22/2023 (IDA) [facility]--> 1 polyp, +diverticulosis.   COMBINED HYSTEROSCOPY DIAGNOSTIC / D&C   05/2010   Bx neg   DEXA  08/2007; 05/12/20   Bone density normal 2009 and 2022.  Plan rpt 2024.   DILATION AND CURETTAGE OF UTERUS  03/2010   ESOPHAGOGASTRODUODENOSCOPY     08/22/2023 [facility] (IDA): antral erythema, nothing to explain bleeding   IR IMAGING GUIDED PORT INSERTION  03/16/2018   IR REMOVAL TUN ACCESS W/ PORT W/O FL MOD SED  09/20/2018   LYMPH NODE BIOPSY N/A 02/19/2018   Procedure: Sentinel LYMPH NODE BIOPSY;  Surgeon: Eloy Herring, MD;  Location: [facility];  Service: Gynecology;  Laterality: N/A;   REPLACEMENT TOTAL KNEE Left 08/2010   X 2 on each   ROBOTIC ASSISTED TOTAL HYSTERECTOMY WITH BILATERAL SALPINGO OOPHERECTOMY N/A 02/19/2018   For endometrial cancer.  Procedure: XI ROBOTIC ASSISTED TOTAL HYSTERECTOMY WITH BILATERAL SALPINGO OOPHORECTOMY;  Surgeon: Eloy Herring, MD;  Location:  [facility];  Service: Gynecology;  Laterality: N/A;   TRANSTHORACIC ECHOCARDIOGRAM  10/11/2016   2018 EF 55-60%, grd I DD.  2025 normal   TUBAL LIGATION  1985   C-Section    MEDS:  Outpatient Medications Prior to Visit  Medication Sig Dispense Refill   albuterol  (PROVENTIL ) (2.5 MG/3ML) 0.083% nebulizer solution Take 3 mLs (2.5 mg total) by nebulization every 4 (four) hours as needed for wheezing or shortness of breath (Dx: J45.50). 75 mL 1   albuterol  (VENTOLIN  HFA) 108 (90 Base) MCG/ACT inhaler INHALE 2 PUFFS INTO LUNGS EVERY 6 HOURS AS NEEDED FOR WHEEZING/SHORTNESS OF BREATH 18 each 1   clobetasol  ointment (TEMOVATE ) 0.05 % APPLY 1 APPLICATION TOPICALLY AS NEEDED. APPLY TO THE SKIN OF THE VULVA FOR ITCHING 30 g 2   cyanocobalamin  (VITAMIN B12) 1000 MCG tablet Take 1 tablet (1,000 mcg total) by mouth daily. 30 tablet 0   diclofenac  Sodium (VOLTAREN ) 1 % GEL Apply 2 g topically 4 (four) times daily. 100 g 3   felodipine  (PLENDIL ) 10 MG 24 hr tablet TAKE 1 TABLET BY MOUTH EVERY DAY 90 tablet 0   ferrous sulfate 325 (65 FE) MG EC tablet Take 325 mg by mouth  at bedtime.     fluticasone  (CUTIVATE ) 0.05 % cream APPLY TO AFFECTED AREA OF RIGHT LOWER LEG TWICE PER DAY. 60 g 1   fluticasone  (VERAMYST) 27.5 MCG/SPRAY nasal spray Place 1 spray into the nose daily.     furosemide  (LASIX ) 40 MG tablet Take 1 tablet (40 mg total) by mouth daily. 30 tablet 1   Insulin  Aspart FlexPen (NOVOLOG ) 100 UNIT/ML NJECT 10-20 UNITS INTO THE SKIN 3 (THREE) 15 MINUTES BEFORE MEALS 45 mL 3   insulin  NPH Human (NOVOLIN  N) 100 UNIT/ML injection Inject 45-50 units under skin daily as advised 45 mL 3   Insulin  Pen Needle 32G X 4 MM MISC Use 3x a day 300 each 3   Insulin  Syringes, Disposable, U-100 0.3 ML MISC Use to inject insulin --6 injections per day 540 each 3   levocetirizine (XYZAL) 5 MG tablet Take 5 mg by mouth at bedtime.  5   levothyroxine  (SYNTHROID ) 125 MCG tablet TAKE 1 TABLET (125 MCG TOTAL) BY MOUTH DAILY. OFFICE VISIT NEEDED FOR FURTHER REFILLS 90 tablet 0   metFORMIN  (GLUCOPHAGE ) 500 MG tablet TAKE 1 TABLET BY MOUTH EVERY MORNING AND 2 TABLETS BY MOUTH AT BEDTIME. STRENGTH: 500 MG 270 tablet 3   miconazole (MICOTIN) 2 % powder Apply 1 application topically 2 (two) times daily as needed for itching.     omeprazole (PRILOSEC) 40 MG capsule Take 40 mg by mouth daily.     Phenazopyridine HCl (AZO URINARY PAIN PO) Take 2 tablets by mouth 3 (three) times daily as needed (urinary pain).     pravastatin  (PRAVACHOL ) 40 MG tablet TAKE 1 TABLET BY MOUTH EVERY DAY 90 tablet 1   Probiotic Product (PROBIOTIC PO) Take 1 capsule by mouth daily.     amitriptyline  (ELAVIL ) 25 MG tablet Take 2 tablets (50 mg total) by mouth at bedtime. 60 tablet 1   HYDROcodone -acetaminophen  (NORCO) 10-325 MG tablet 1/2 - 1 tab po bid prn pain 45 tablet 0   potassium chloride  SA (KLOR-CON  M) 20 MEQ tablet Take 1 tablet (20 mEq total) by mouth daily. 30 tablet 0   cefdinir  (OMNICEF ) 300 MG capsule Take 1 capsule (300 mg total) by mouth 2 (two) times daily for 3 days.     Mometasone  Furo-Formoterol   Fum (  DULERA ) 50-5 MCG/ACT AERO TAKE 2 PUFFS BY MOUTH TWICE A DAY (Patient not taking: Reported on 12/08/2023) 13 g 3   No facility-administered medications prior to visit.    Physical Exam    12/08/2023    1:01 PM 12/01/2023   10:32 AM 12/01/2023   10:12 AM  Vitals with BMI  Height 5' 1  5' 1  Weight 217 lbs 10 oz  219 lbs 13 oz  BMI 41.14  41.55  Systolic 141 114 854  Diastolic 60 64 69  Pulse 90  94   Gen: Alert, well appearing.  Patient is oriented to person, place, time, and situation. AFFECT: pleasant, lucid thought and speech. Conjunctival and oral mucosa mild pallor CV: RRR, soft syst murm LUNGS: CTA bilat, nonlabored EXT: No edema Skin: no pallor or jaundice  Pertinent labs/imaging Last CBC Lab Results  Component Value Date   WBC 9.3 12/01/2023   HGB 9.7 (L) 12/01/2023   HCT 30.9 (L) 12/01/2023   MCV 90.3 12/01/2023   MCH 28.1 11/23/2023   RDW 18.6 (H) 12/01/2023   PLT 347.0 12/01/2023   Lab Results  Component Value Date   IRON  35 11/20/2023   TIBC 235 (L) 11/20/2023   FERRITIN 446 (H) 11/20/2023   Last metabolic panel Lab Results  Component Value Date   GLUCOSE 100 (H) 12/01/2023   NA 141 12/01/2023   K 5.0 12/01/2023   CL 102 12/01/2023   CO2 29 12/01/2023   BUN 44 (H) 12/01/2023   CREATININE 1.46 (H) 12/01/2023   GFRNONAA 34 (L) 11/23/2023   CALCIUM 9.4 12/01/2023   PROT 7.2 11/20/2023   ALBUMIN 3.2 (L) 11/20/2023   BILITOT 0.2 11/20/2023   ALKPHOS 70 11/20/2023   AST 17 11/20/2023   ALT 6 11/20/2023   ANIONGAP 11 11/23/2023   Last lipids Lab Results  Component Value Date   CHOL 110 04/28/2023   HDL 28.70 (L) 04/28/2023   LDLCALC 45 04/28/2023   LDLDIRECT 78.0 10/05/2018   TRIG 185.0 (H) 04/28/2023   CHOLHDL 4 04/28/2023   Last hemoglobin A1c Lab Results  Component Value Date   HGBA1C 5.8 (A) 10/18/2023   Last thyroid  functions Lab Results  Component Value Date   TSH 2.773 11/21/2023   Last vitamin B12 and Folate Lab  Results  Component Value Date   VITAMINB12 319 11/20/2023   FOLATE 11.5 11/20/2023   ASSESSMENT/PLAN:  1) Acute on chronic diast HF, resolved with diuresis. Cont 40mg  lasix  every day.  2) Acute anemia, progressive, unknown etiology (IDA+anemia of chronic dz?).  GI eval unrevealing. She is s/p iron  infusion 04/2023 and pRBC transfusion this hospitalization. Recheck cbc today. Hem onc referral in process.  3) UTI, doing well s/p  abx.  4) CRI III, worsening + albuminuria the last 2 mo. Blood w/u by Dr. Catherine in July NEG. Nephrol referral was ordered at that time as well. BMET today.  5) Abnl CT abd/pelv Extensive soft tissue stranding in the bilateral perinephric and peripelvic soft tissue space, no hydronephrosis. She was to be treated for 10 days with antibiotics for her UTI (pansensitive E. coli) and instructed to follow-up with Dr. Rogelio where she would possibly get a repeat CT scan.  Medical decision making of high complexity was utilized today  FOLLOW UP:  1-2 wks f/u anemia and diast dysf  Signed:  Gerlene Hockey, MD           12/11/2023

## 2023-12-02 ENCOUNTER — Other Ambulatory Visit: Payer: Self-pay | Admitting: Family Medicine

## 2023-12-04 ENCOUNTER — Ambulatory Visit: Payer: Self-pay | Admitting: Family Medicine

## 2023-12-08 ENCOUNTER — Ambulatory Visit (INDEPENDENT_AMBULATORY_CARE_PROVIDER_SITE_OTHER): Admitting: Family Medicine

## 2023-12-08 ENCOUNTER — Encounter: Payer: Self-pay | Admitting: Family Medicine

## 2023-12-08 ENCOUNTER — Telehealth: Payer: Self-pay

## 2023-12-08 VITALS — BP 141/60 | HR 90 | Temp 98.7°F | Ht 61.0 in | Wt 217.6 lb

## 2023-12-08 DIAGNOSIS — L989 Disorder of the skin and subcutaneous tissue, unspecified: Secondary | ICD-10-CM | POA: Diagnosis not present

## 2023-12-08 DIAGNOSIS — J4540 Moderate persistent asthma, uncomplicated: Secondary | ICD-10-CM | POA: Diagnosis not present

## 2023-12-08 DIAGNOSIS — I5032 Chronic diastolic (congestive) heart failure: Secondary | ICD-10-CM

## 2023-12-08 DIAGNOSIS — D649 Anemia, unspecified: Secondary | ICD-10-CM | POA: Diagnosis not present

## 2023-12-08 MED ORDER — HYDROCODONE-ACETAMINOPHEN 10-325 MG PO TABS: ORAL_TABLET | ORAL | 0 refills | Status: DC

## 2023-12-08 NOTE — Telephone Encounter (Signed)
 Hey,  I am reaching out to see if you could assist with finding an alternative or getting coverage for Dulera  inhaler. Her insurance will not cover it.

## 2023-12-08 NOTE — Patient Instructions (Signed)
 [facility] P: [phone] F: [phone]

## 2023-12-08 NOTE — Progress Notes (Signed)
 OFFICE VISIT  12/11/2023  CC:  Chief Complaint  Patient presents with   Hospitalization Follow-up    [name] who presents accompanied by her husband for 1 week follow-up anemia and chronic diastolic HF.  INTERIM HX: No new concerns. Says she is eating well.  Taking Lasix  40 mg a day, good urine output. Bowel movements brown.  No melena or hematochezia.  No SOB or dyspnea on exertion.  No dizziness or chest pain Taking Slow Fe 2 tabs per day.  Using rescue inhaler a couple of times per day lately.  Has been out of maintenance inhaler for 2 months because ensure denied coverage.  ROS: see HPI above  Past Medical History:  Diagnosis Date   Allergic rhinitis    Allergy testing: results pending as of 05/28/16 (Dr. Fleeta Smock)   Anemia 10/2020   anemia of chronic dz (? + mild IDA?--iron  started   Arthritis    Chemotherapy-induced neuropathy (HCC) 2019   Cough variant asthma    DDD (degenerative disc disease), lumbar    Diabetes mellitus with complication (HCC)    Mild nonprolif DR R eye 12/2017-->referred to retinal specialist DM MANAGED BY DR. GHERGHE AS OF 2021   Endometrial cancer (HCC) 02/2018   REMISSION AS OF 08/2018.  Stage III (T3, Nx, Mx)  Path: endometroid adenocarcinoma involving cervix, uterus, and fallopian tubes (Pt got TAH & BSO 02/2018). No sign of recurrence as of onc f/u 05/2019.   Endometrial hyperplasia 07/02/2015   Treated with D&C and then Mirena IUD x 5 years.  IUD removed 3/17.     GERD (gastroesophageal reflux disease)    Hemorrhoids    Herpes zoster 10/2015   L side belt-line   History of adenomatous polyp of colon 02/25/2013   5 mm cecal polyp removed by Dr. Alfrieda 5 yrs   History of blood transfusion    History of cellulitis 08/2010   Left (Since replacemnt of Left knee   History of radiation therapy 05/21/2018   [facility] brachytherapy 04/24/2018-05/21/2018  Dr Lynwood Nasuti   Hyperkalemia 03/2018   started after pt started  getting chemo-->had to stop enalapril .   Hyperlipidemia    Not on statin b/c lipids stayed down when sugars came down per pt.  She says Dr. Tommas knows she is not on statin anymore.   Hypertension    Hypothyroidism    IDA (iron  deficiency anemia)    GI ref 04/2023   Nephrolithiasis 07/2005   Obesity    OSA on CPAP    Osteoarthritis    knees, ankle, + ? scapholunate ligament disruption (x-ray 06/2017).  Progressive arthritis L wrist and hand 09/2022 radiographs   Pancytopenia due to antineoplastic chemotherapy [facility])    progressive as of 06/2018. Stable 08/2018.   Recurrent UTI    started cipro  250 qd 09/2018   Sciatica of left side    Severe persistent asthma    cough-variant--saw Allergist 05/25/16 and was switched from max dose advair to symbicort .   Systolic murmur    ECHO fine 10/2016-->murmur likely flow murmur assoc with HTN.   Zoon's vulvitis    Bx-proven (GYN) -lichen sclerosis.  Clobetasol  0.05% ointment per GYN    Past Surgical History:  Procedure Laterality Date   ANKLE FRACTURE SURGERY  1984   Pin & repair   ARTHROSCOPIC REPAIR ACL  2000   Due to ACL tear   ARTHROSCOPIC REPAIR ACL  08/2006   Blateral knee rerlacements x4 2 times  each knee     BREAST BIOPSY Right 2014   Benign   CARDIAC CATHETERIZATION  08/2005   clear vessel mild mitral    CARPAL TUNNEL RELEASE Right [id number]   1989 Left   CESAREAN SECTION  1985   CHOLECYSTECTOMY OPEN  1988   COLONOSCOPY N/A 02/25/2013   Tubular adenoma x 1: Recall 5 yrs. Procedure: COLONOSCOPY;  Surgeon: Renaye Sous, MD;  Location: [facility];  Service: Endoscopy;  Laterality: N/A;   COLONOSCOPY     08/22/2023 (IDA) [facility]--> 1 polyp, +diverticulosis.   COMBINED HYSTEROSCOPY DIAGNOSTIC / D&C  05/2010   Bx neg   DEXA  08/2007; 05/12/20   Bone density normal 2009 and 2022.  Plan rpt 2024.   DILATION AND CURETTAGE OF UTERUS  03/2010   ESOPHAGOGASTRODUODENOSCOPY     08/22/2023 [facility] (IDA): antral erythema, nothing to  explain bleeding   IR IMAGING GUIDED PORT INSERTION  03/16/2018   IR REMOVAL TUN ACCESS W/ PORT W/O FL MOD SED  09/20/2018   LYMPH NODE BIOPSY N/A 02/19/2018   Procedure: Sentinel LYMPH NODE BIOPSY;  Surgeon: Eloy Herring, MD;  Location: [facility];  Service: Gynecology;  Laterality: N/A;   REPLACEMENT TOTAL KNEE Left 08/2010   X 2 on each   ROBOTIC ASSISTED TOTAL HYSTERECTOMY WITH BILATERAL SALPINGO OOPHERECTOMY N/A 02/19/2018   For endometrial cancer.  Procedure: XI ROBOTIC ASSISTED TOTAL HYSTERECTOMY WITH BILATERAL SALPINGO OOPHORECTOMY;  Surgeon: Eloy Herring, MD;  Location: [facility];  Service: Gynecology;  Laterality: N/A;   TRANSTHORACIC ECHOCARDIOGRAM  10/11/2016    EF 55-60%, grd I DD.   TUBAL LIGATION  1985   C-Section    Outpatient Medications Prior to Visit  Medication Sig Dispense Refill   albuterol  (PROVENTIL ) (2.5 MG/3ML) 0.083% nebulizer solution Take 3 mLs (2.5 mg total) by nebulization every 4 (four) hours as needed for wheezing or shortness of breath (Dx: J45.50). 75 mL 1   albuterol  (VENTOLIN  HFA) 108 (90 Base) MCG/ACT inhaler INHALE 2 PUFFS INTO LUNGS EVERY 6 HOURS AS NEEDED FOR WHEEZING/SHORTNESS OF BREATH 18 each 1   amitriptyline  (ELAVIL ) 25 MG tablet TAKE 2 TABLETS BY MOUTH AT BEDTIME. 60 tablet 1   clobetasol  ointment (TEMOVATE ) 0.05 % APPLY 1 APPLICATION TOPICALLY AS NEEDED. APPLY TO THE SKIN OF THE VULVA FOR ITCHING 30 g 2   cyanocobalamin  (VITAMIN B12) 1000 MCG tablet Take 1 tablet (1,000 mcg total) by mouth daily. 30 tablet 0   diclofenac  Sodium (VOLTAREN ) 1 % GEL Apply 2 g topically 4 (four) times daily. 100 g 3   felodipine  (PLENDIL ) 10 MG 24 hr tablet TAKE 1 TABLET BY MOUTH EVERY DAY 90 tablet 0   ferrous sulfate 325 (65 FE) MG EC tablet Take 325 mg by mouth at bedtime.     fluticasone  (CUTIVATE ) 0.05 % cream APPLY TO AFFECTED AREA OF RIGHT LOWER LEG TWICE PER DAY. 60 g 1   fluticasone  (VERAMYST) 27.5 MCG/SPRAY nasal spray  Place 1 spray into the nose daily.     furosemide  (LASIX ) 40 MG tablet Take 1 tablet (40 mg total) by mouth daily. 30 tablet 1   Insulin  Aspart FlexPen (NOVOLOG ) 100 UNIT/ML NJECT 10-20 UNITS INTO THE SKIN 3 (THREE) 15 MINUTES BEFORE MEALS 45 mL 3   insulin  NPH Human (NOVOLIN  N) 100 UNIT/ML injection Inject 45-50 units under skin daily as advised 45 mL 3   Insulin  Pen Needle 32G X 4 MM MISC Use 3x a day 300 each  3   Insulin  Syringes, Disposable, U-100 0.3 ML MISC Use to inject insulin --6 injections per day 540 each 3   levocetirizine (XYZAL) 5 MG tablet Take 5 mg by mouth at bedtime.  5   levothyroxine  (SYNTHROID ) 125 MCG tablet TAKE 1 TABLET (125 MCG TOTAL) BY MOUTH DAILY. OFFICE VISIT NEEDED FOR FURTHER REFILLS 90 tablet 0   metFORMIN  (GLUCOPHAGE ) 500 MG tablet TAKE 1 TABLET BY MOUTH EVERY MORNING AND 2 TABLETS BY MOUTH AT BEDTIME. STRENGTH: 500 MG 270 tablet 3   miconazole (MICOTIN) 2 % powder Apply 1 application topically 2 (two) times daily as needed for itching.     omeprazole (PRILOSEC) 40 MG capsule Take 40 mg by mouth daily.     Phenazopyridine HCl (AZO URINARY PAIN PO) Take 2 tablets by mouth 3 (three) times daily as needed (urinary pain).     pravastatin  (PRAVACHOL ) 40 MG tablet TAKE 1 TABLET BY MOUTH EVERY DAY 90 tablet 1   Probiotic Product (PROBIOTIC PO) Take 1 capsule by mouth daily.     HYDROcodone -acetaminophen  (NORCO) 10-325 MG tablet 1/2 - 1 tab po bid prn pain 45 tablet 0   potassium chloride  SA (KLOR-CON  M) 20 MEQ tablet Take 1 tablet (20 mEq total) by mouth daily. 30 tablet 0   Mometasone  Furo-Formoterol  Fum (DULERA ) 50-5 MCG/ACT AERO TAKE 2 PUFFS BY MOUTH TWICE A DAY (Patient not taking: Reported on 12/08/2023) 13 g 3   No facility-administered medications prior to visit.    Allergies  Allergen Reactions   Adhesive [Tape] Other (See Comments)    Takes patient's skin off.   Banana Diarrhea and Nausea Only   Egg-Derived Products Diarrhea and Nausea Only   Latex Other  (See Comments)    sensativity   Linzess [Linaclotide] Diarrhea   Ozempic  (0.25 Or 0.5 Mg-Dose) [Semaglutide (0.25 Or 0.5mg -Dos)] Diarrhea   Penicillins Rash and Other (See Comments)    Has had cephalosporins without incident   Pine Swelling and Rash   Rose Swelling and Rash    Review of Systems As per HPI  PE:    12/08/2023    1:01 PM 12/01/2023   10:32 AM 12/01/2023   10:12 AM  Vitals with BMI  Height 5' 1  5' 1  Weight 217 lbs 10 oz  219 lbs 13 oz  BMI 41.14  41.55  Systolic 141 114 854  Diastolic 60 64 69  Pulse 90  94     Physical Exam  Gen: Alert, well appearing.  Patient is oriented to person, place, time, and situation. AFFECT: pleasant, lucid thought and speech. CV: RRR, no murmur LUNGS: CTA bilat, nonlabored. EXT: no pitting edema SKIN: left check just below the lower eyelid there is a 4 mm smooth, flesh-colored pearly papule.  LABS:  Last CBC Lab Results  Component Value Date   WBC 9.3 12/01/2023   HGB 9.7 (L) 12/01/2023   HCT 30.9 (L) 12/01/2023   MCV 90.3 12/01/2023   MCH 28.1 11/23/2023   RDW 18.6 (H) 12/01/2023   PLT 347.0 12/01/2023   Lab Results  Component Value Date   IRON  35 11/20/2023   TIBC 235 (L) 11/20/2023   FERRITIN 446 (H) 11/20/2023   Lab Results  Component Value Date   VITAMINB12 319 11/20/2023   Last metabolic panel Lab Results  Component Value Date   GLUCOSE 100 (H) 12/01/2023   NA 141 12/01/2023   K 5.0 12/01/2023   CL 102 12/01/2023   CO2 29 12/01/2023   BUN 44 (H)  12/01/2023   CREATININE 1.46 (H) 12/01/2023   GFR 35.64 (L) 12/01/2023   CALCIUM 9.4 12/01/2023   PROT 7.2 11/20/2023   ALBUMIN 3.2 (L) 11/20/2023   BILITOT 0.2 11/20/2023   ALKPHOS 70 11/20/2023   AST 17 11/20/2023   ALT 6 11/20/2023   ANIONGAP 11 11/23/2023   Last hemoglobin A1c Lab Results  Component Value Date   HGBA1C 5.8 (A) 10/18/2023   Lab Results  Component Value Date   VITAMINB12 319 11/20/2023   IMPRESSION AND PLAN:  #1  anemia, unclear etiology. Looking back, this has actually been present since 2020 when she had postchemotherapy pancytopenia.  Her white blood cell count and platelets returned to normal pretty quickly but hemoglobin remained mildly low, MCV normal, iron  and percent sat and transferrin were low but ferritin still WNL. Significant drop in hemoglobin in January this year prompted GI evaluation, which was unrevealing with EGD and colonoscopy.  She will continue iron  supplement and we are in the process of getting her set up with hematology. We gave her the contact information so she can call and set up appointment. No labs today  #2 chronic diastolic heart failure. Euvolemic currently. Continue Lasix  40 mg a day. Stop potassium supplement.  #3 moderate persistent asthma. No maintenance inhaler for 2 months because of insurance denial of coverage for her Dulera . We will see if our clinical pharmacy team can help with this.  #4 Facial skin lesion, left side inferior to lower eyelid. Question xanthoma.  Derm referral today.  An After Visit Summary was printed and given to the patient.  FOLLOW UP: Return in about 6 weeks (around 01/19/2024) for routine chronic illness f/u.  Signed:  Gerlene Hockey, MD           12/11/2023

## 2023-12-11 ENCOUNTER — Encounter: Payer: Self-pay | Admitting: Family Medicine

## 2023-12-12 ENCOUNTER — Telehealth: Payer: Self-pay

## 2023-12-12 NOTE — Telephone Encounter (Addendum)
 Spoke with patient and given provider instructions. She has not made the nephrology appointment yet she was unaware referral ordered. MyChart message sent with [facility] contact information for her to call and schedule.

## 2023-12-12 NOTE — Telephone Encounter (Signed)
 Pls notify pt. Our pharmacologist looked into her situation with her maintenance inhaler. All of the maintenance inhalers are tier 3 on her insurance formulary.  She has to meet a $600 deductible and then she would only pay 25% of the cost. Breyna  HFA - 25 % cost approximately - $44 (full cost $175). I would say the Breyna  would be the lowest cost to her but I suspect the first fill might be the full cost until she meets her deductible.  Asked her if she wants me to send a prescription in for the Breyna  inhaler.

## 2023-12-12 NOTE — Telephone Encounter (Signed)
-----   Message from Aleene VEAR Hockey sent at 12/11/2023  5:31 PM EDT ----- Pls call pt and tell her I want her to stop taking her metformin  d/t her kidney function being impaired lately. Also, does she have a nephrologist appt yet? (Dr. Catherine ordered referral in July).

## 2023-12-12 NOTE — Telephone Encounter (Signed)
 Looks like patient has a Part D Plan thru [employer] / [employer] / [employer].  She has a $590 deductible to meet for all tier 3 or higher medications.  Checked her formulary for tier 1 or 2 inhalers - only inhaler that is in either of those categories is albuterol  rescue inhaler.   Tier 3 inhalers will require she met the deductible then her copay is 25% of medications cost. Tier 3 inhalers are Advair HFA, Breo and Breyna  HFA (a generic form of Symbicort )   Formulary inhalers that are similar to Dulera :  Advair HFA - 25 % cost approximately - $115 (full cost $462) Breo - 25 % cost approximately - $123 (full cost $492) Breyna  HFA - 25 % cost approximately - $44 (full cost $175)  I would say the Breyna  would be the lowest cost to her but I suspect the first fill might be the full cost until she meets her deductible.   There is a patient assistance programs for [place] but it requires patient to spend $600 out of pocket before Med D patient's are eligible.   There also is a patient assistance program for Breztri but this is usually for COPD and contains 3 medications instead of 2 that are in Dulera .

## 2023-12-12 NOTE — Telephone Encounter (Signed)
 Please see update below regarding inhaler coverage

## 2023-12-26 ENCOUNTER — Ambulatory Visit: Admitting: Family Medicine

## 2023-12-26 ENCOUNTER — Encounter: Payer: Self-pay | Admitting: Family Medicine

## 2023-12-26 VITALS — BP 117/66 | HR 93 | Temp 99.4°F | Ht 61.0 in | Wt 219.8 lb

## 2023-12-26 DIAGNOSIS — N3000 Acute cystitis without hematuria: Secondary | ICD-10-CM

## 2023-12-26 DIAGNOSIS — N390 Urinary tract infection, site not specified: Secondary | ICD-10-CM | POA: Diagnosis not present

## 2023-12-26 DIAGNOSIS — M542 Cervicalgia: Secondary | ICD-10-CM

## 2023-12-26 DIAGNOSIS — R300 Dysuria: Secondary | ICD-10-CM | POA: Diagnosis not present

## 2023-12-26 MED ORDER — CIPROFLOXACIN HCL 250 MG PO TABS: 250.0000 mg | ORAL_TABLET | Freq: Two times a day (BID) | ORAL | 0 refills | Status: DC

## 2023-12-26 NOTE — Progress Notes (Signed)
 OFFICE VISIT  12/26/2023  CC:  Chief Complaint  Patient presents with   Urinary Concern    Dysuria, urgency and frequency, pressure; did at home UA for Uti (positive)   Neck Pain    Pulled muscle x days; nothing improves pain, has tried heat    [name] who presents for urinary concern.  HPI: 2 to 3-day history of progressive burning with urination, urinary urgency, urinary frequency.  She gets some bladder spasms but otherwise no abdominal pain.  No nausea, no fever, no flank pain.  No blood in urine. History of recurrent UTI.  11/17/2023 urine culture showed E. coli, pansensitive.  Also has had about 3 days of pain on the left side of her neck.  The area is on the left occiput that extends down over the left sternocleidomastoid region. Range of motion is impaired.  She has been trying heat application. Vicodin 10/325 tab has helped.  She used 1 last night and was able to sleep.  PMP AWARE reviewed today: most recent rx for Vicodin 10/325 was filled 12/08/2023, # 45, rx by me. No red flags.  ROS as above, plus-->  no dizziness, no HAs, no rashes, no melena/hematochezia.  No focal weakness, paresthesias, or tremors.  No acute vision or hearing abnormalities.  No recent changes in lower legs.  No palpitations.    Past Medical History:  Diagnosis Date   Allergic rhinitis    Allergy testing: results pending as of 05/28/16 (Dr. Fleeta Smock)   Anemia 10/2020   anemia of chronic dz (? + mild IDA?--iron  started   Chemotherapy-induced neuropathy (HCC) 2019   Chronic renal insufficiency, stage 3 (moderate) (HCC)    Cough variant asthma    DDD (degenerative disc disease), lumbar    Diabetes mellitus with complication (HCC)    Mild nonprolif DR R eye 12/2017-->referred to retinal specialist DM MANAGED BY DR. GHERGHE AS OF 2021   Endometrial cancer (HCC) 02/2018   REMISSION AS OF 08/2018.  Stage III (T3, Nx, Mx)  Path: endometroid adenocarcinoma involving cervix, uterus,  and fallopian tubes (Pt got TAH & BSO 02/2018). No sign of recurrence as of onc f/u 05/2019.   Endometrial hyperplasia 07/02/2015   Treated with D&C and then Mirena IUD x 5 years.  IUD removed 3/17.     GERD (gastroesophageal reflux disease)    Hemorrhoids    Herpes zoster 10/2015   L side belt-line   History of adenomatous polyp of colon 02/25/2013   5 mm cecal polyp removed by Dr. Alfrieda 5 yrs   History of blood transfusion    History of cellulitis 08/2010   Left (Since replacemnt of Left knee   History of radiation therapy 05/21/2018   [facility] brachytherapy 04/24/2018-05/21/2018  Dr Lynwood Nasuti   Hyperkalemia 03/2018   started after pt started getting chemo-->had to stop enalapril .   Hyperlipidemia    Not on statin b/c lipids stayed down when sugars came down per pt.  She says Dr. Tommas knows she is not on statin anymore.   Hypertension    Hypothyroidism    IDA (iron  deficiency anemia)    GI ref 04/2023   Moderate persistent asthma    Nephrolithiasis 07/2005   Obesity    OSA on CPAP    Osteoarthritis    knees, ankle, + ? scapholunate ligament disruption (x-ray 06/2017).  Progressive arthritis L wrist and hand 09/2022 radiographs   Pancytopenia due to antineoplastic chemotherapy (HCC)  progressive as of 06/2018. Stable 08/2018.   Recurrent UTI    started cipro  250 qd 09/2018   Sciatica of left side    Severe persistent asthma    cough-variant--saw Allergist 05/25/16 and was switched from max dose advair to symbicort .   Systolic murmur    ECHO fine 10/2016-->murmur likely flow murmur assoc with HTN.   Zoon's vulvitis    Bx-proven (GYN) -lichen sclerosis.  Clobetasol  0.05% ointment per GYN    Past Surgical History:  Procedure Laterality Date   ANKLE FRACTURE SURGERY  1984   Pin & repair   ARTHROSCOPIC REPAIR ACL  2000   Due to ACL tear   ARTHROSCOPIC REPAIR ACL  08/2006   Blateral knee rerlacements x4 2 times each knee     BREAST BIOPSY Right 2014   Benign   CARDIAC  CATHETERIZATION  08/2005   clear vessel mild mitral    CARPAL TUNNEL RELEASE Right [id number]   1989 Left   CESAREAN SECTION  1985   CHOLECYSTECTOMY OPEN  1988   COLONOSCOPY N/A 02/25/2013   Tubular adenoma x 1: Recall 5 yrs. Procedure: COLONOSCOPY;  Surgeon: Renaye Sous, MD;  Location: [facility];  Service: Endoscopy;  Laterality: N/A;   COLONOSCOPY     08/22/2023 (IDA) [facility]--> 1 polyp, +diverticulosis.   COMBINED HYSTEROSCOPY DIAGNOSTIC / D&C  05/2010   Bx neg   DEXA  08/2007; 05/12/20   Bone density normal 2009 and 2022.  Plan rpt 2024.   DILATION AND CURETTAGE OF UTERUS  03/2010   ESOPHAGOGASTRODUODENOSCOPY     08/22/2023 [facility] (IDA): antral erythema, nothing to explain bleeding   IR IMAGING GUIDED PORT INSERTION  03/16/2018   IR REMOVAL TUN ACCESS W/ PORT W/O FL MOD SED  09/20/2018   LYMPH NODE BIOPSY N/A 02/19/2018   Procedure: Sentinel LYMPH NODE BIOPSY;  Surgeon: Eloy Herring, MD;  Location: [facility];  Service: Gynecology;  Laterality: N/A;   REPLACEMENT TOTAL KNEE Left 08/2010   X 2 on each   ROBOTIC ASSISTED TOTAL HYSTERECTOMY WITH BILATERAL SALPINGO OOPHERECTOMY N/A 02/19/2018   For endometrial cancer.  Procedure: XI ROBOTIC ASSISTED TOTAL HYSTERECTOMY WITH BILATERAL SALPINGO OOPHORECTOMY;  Surgeon: Eloy Herring, MD;  Location: [facility] [place];  Service: Gynecology;  Laterality: N/A;   TRANSTHORACIC ECHOCARDIOGRAM  10/11/2016   2018 EF 55-60%, grd I DD.  2025 normal   TUBAL LIGATION  1985   C-Section    Outpatient Medications Prior to Visit  Medication Sig Dispense Refill   albuterol  (PROVENTIL ) (2.5 MG/3ML) 0.083% nebulizer solution Take 3 mLs (2.5 mg total) by nebulization every 4 (four) hours as needed for wheezing or shortness of breath (Dx: J45.50). 75 mL 1   albuterol  (VENTOLIN  HFA) 108 (90 Base) MCG/ACT inhaler INHALE 2 PUFFS INTO LUNGS EVERY 6 HOURS AS NEEDED FOR WHEEZING/SHORTNESS OF BREATH 18 each 1   amitriptyline  (ELAVIL )  25 MG tablet TAKE 2 TABLETS BY MOUTH AT BEDTIME. 60 tablet 1   clobetasol  ointment (TEMOVATE ) 0.05 % APPLY 1 APPLICATION TOPICALLY AS NEEDED. APPLY TO THE SKIN OF THE VULVA FOR ITCHING 30 g 2   cyanocobalamin  (VITAMIN B12) 1000 MCG tablet Take 1 tablet (1,000 mcg total) by mouth daily. 30 tablet 0   diclofenac  Sodium (VOLTAREN ) 1 % GEL Apply 2 g topically 4 (four) times daily. 100 g 3   felodipine  (PLENDIL ) 10 MG 24 hr tablet TAKE 1 TABLET BY MOUTH EVERY DAY 90 tablet 0   ferrous sulfate 325 (  65 FE) MG EC tablet Take 325 mg by mouth at bedtime.     fluticasone  (CUTIVATE ) 0.05 % cream APPLY TO AFFECTED AREA OF RIGHT LOWER LEG TWICE PER DAY. 60 g 1   fluticasone  (VERAMYST) 27.5 MCG/SPRAY nasal spray Place 1 spray into the nose daily.     furosemide  (LASIX ) 40 MG tablet Take 1 tablet (40 mg total) by mouth daily. 30 tablet 1   HYDROcodone -acetaminophen  (NORCO) 10-325 MG tablet 1/2 - 1 tab po bid prn pain 45 tablet 0   Insulin  Aspart FlexPen (NOVOLOG ) 100 UNIT/ML NJECT 10-20 UNITS INTO THE SKIN 3 (THREE) 15 MINUTES BEFORE MEALS 45 mL 3   insulin  NPH Human (NOVOLIN  N) 100 UNIT/ML injection Inject 45-50 units under skin daily as advised 45 mL 3   Insulin  Pen Needle 32G X 4 MM MISC Use 3x a day 300 each 3   Insulin  Syringes, Disposable, U-100 0.3 ML MISC Use to inject insulin --6 injections per day 540 each 3   levocetirizine (XYZAL) 5 MG tablet Take 5 mg by mouth at bedtime.  5   levothyroxine  (SYNTHROID ) 125 MCG tablet TAKE 1 TABLET (125 MCG TOTAL) BY MOUTH DAILY. OFFICE VISIT NEEDED FOR FURTHER REFILLS 90 tablet 0   miconazole (MICOTIN) 2 % powder Apply 1 application topically 2 (two) times daily as needed for itching.     omeprazole (PRILOSEC) 40 MG capsule Take 40 mg by mouth daily.     Phenazopyridine HCl (AZO URINARY PAIN PO) Take 2 tablets by mouth 3 (three) times daily as needed (urinary pain).     pravastatin  (PRAVACHOL ) 40 MG tablet TAKE 1 TABLET BY MOUTH EVERY DAY 90 tablet 1   Probiotic  Product (PROBIOTIC PO) Take 1 capsule by mouth daily.     metFORMIN  (GLUCOPHAGE ) 500 MG tablet TAKE 1 TABLET BY MOUTH EVERY MORNING AND 2 TABLETS BY MOUTH AT BEDTIME. STRENGTH: 500 MG 270 tablet 3   No facility-administered medications prior to visit.    Allergies  Allergen Reactions   Adhesive [Tape] Other (See Comments)    Takes patient's skin off.   Banana Diarrhea and Nausea Only   Egg-Derived Products Diarrhea and Nausea Only   Latex Other (See Comments)    sensativity   Linzess [Linaclotide] Diarrhea   Ozempic  (0.25 Or 0.5 Mg-Dose) [Semaglutide (0.25 Or 0.5mg -Dos)] Diarrhea   Penicillins Rash and Other (See Comments)    Has had cephalosporins without incident   Pine Swelling and Rash   Rose Swelling and Rash    Review of Systems  As per HPI  PE:    12/26/2023   11:13 AM 12/08/2023    1:01 PM 12/01/2023   10:32 AM  Vitals with BMI  Height 5' 1 5' 1   Weight 219 lbs 13 oz 217 lbs 10 oz   BMI 41.55 41.14   Systolic 117 141 885  Diastolic 66 60 64  Pulse 93 90      Physical Exam  General: She is sitting in a wheelchair, chronically ill-appearing. No acute distress. She has some tenderness to palpation over the left cervico-occipital junction extending into to the neck muscles.  Moderately limited range of motion of the neck.   LABS:  Last CBC Lab Results  Component Value Date   WBC 9.3 12/01/2023   HGB 9.7 (L) 12/01/2023   HCT 30.9 (L) 12/01/2023   MCV 90.3 12/01/2023   MCH 28.1 11/23/2023   RDW 18.6 (H) 12/01/2023   PLT 347.0 12/01/2023   Lab Results  Component  Value Date   IRON  35 11/20/2023   TIBC 235 (L) 11/20/2023   FERRITIN 446 (H) 11/20/2023   Last metabolic panel Lab Results  Component Value Date   GLUCOSE 100 (H) 12/01/2023   NA 141 12/01/2023   K 5.0 12/01/2023   CL 102 12/01/2023   CO2 29 12/01/2023   BUN 44 (H) 12/01/2023   CREATININE 1.46 (H) 12/01/2023   GFR 35.64 (L) 12/01/2023   CALCIUM 9.4 12/01/2023   PROT 7.2 11/20/2023    ALBUMIN 3.2 (L) 11/20/2023   BILITOT 0.2 11/20/2023   ALKPHOS 70 11/20/2023   AST 17 11/20/2023   ALT 6 11/20/2023   ANIONGAP 11 11/23/2023   Last hemoglobin A1c Lab Results  Component Value Date   HGBA1C 5.8 (A) 10/18/2023   IMPRESSION AND PLAN:  #1 acute UTI.  History of recurrent UTI. Cipro  250 twice daily x 3 days (renal dosing). She was unable to produce a urine sample today.  2.  Muscular neck pain on the left. Give this some time with heat application, can use Vicodin 10/325 1 tab up to twice a day.  Encouraged her to do range of motion exercises with her neck when she gets some pain relief. If symptoms persist and are fitting with a diagnosis of greater occipital nerve pain then we can consider steroid injection. Also consider C-spine radiographs.  An After Visit Summary was printed and given to the patient.  FOLLOW UP: Return if symptoms worsen or fail to improve.  Signed:  Gerlene Hockey, MD           12/26/2023

## 2023-12-27 ENCOUNTER — Encounter: Payer: Self-pay | Admitting: Obstetrics & Gynecology

## 2024-01-01 ENCOUNTER — Encounter: Payer: Self-pay | Admitting: Family

## 2024-01-01 ENCOUNTER — Other Ambulatory Visit: Payer: Self-pay | Admitting: Family

## 2024-01-01 ENCOUNTER — Inpatient Hospital Stay (HOSPITAL_BASED_OUTPATIENT_CLINIC_OR_DEPARTMENT_OTHER): Admitting: Family

## 2024-01-01 ENCOUNTER — Inpatient Hospital Stay: Attending: Obstetrics & Gynecology

## 2024-01-01 VITALS — BP 155/54 | HR 84 | Temp 98.2°F | Resp 18 | Ht 61.0 in

## 2024-01-01 DIAGNOSIS — D649 Anemia, unspecified: Secondary | ICD-10-CM

## 2024-01-01 DIAGNOSIS — E119 Type 2 diabetes mellitus without complications: Secondary | ICD-10-CM | POA: Diagnosis not present

## 2024-01-01 DIAGNOSIS — Z794 Long term (current) use of insulin: Secondary | ICD-10-CM | POA: Diagnosis not present

## 2024-01-01 DIAGNOSIS — D631 Anemia in chronic kidney disease: Secondary | ICD-10-CM | POA: Diagnosis not present

## 2024-01-01 DIAGNOSIS — N1831 Chronic kidney disease, stage 3a: Secondary | ICD-10-CM | POA: Diagnosis not present

## 2024-01-01 DIAGNOSIS — Z08 Encounter for follow-up examination after completed treatment for malignant neoplasm: Secondary | ICD-10-CM | POA: Insufficient documentation

## 2024-01-01 DIAGNOSIS — Z8542 Personal history of malignant neoplasm of other parts of uterus: Secondary | ICD-10-CM | POA: Insufficient documentation

## 2024-01-01 LAB — CBC WITH DIFFERENTIAL (CANCER CENTER ONLY)
Abs Immature Granulocytes: 0.17 K/uL — ABNORMAL HIGH (ref 0.00–0.07)
Basophils Absolute: 0 K/uL (ref 0.0–0.1)
Basophils Relative: 0 %
Eosinophils Absolute: 0.4 K/uL (ref 0.0–0.5)
Eosinophils Relative: 4 %
HCT: 28.3 % — ABNORMAL LOW (ref 36.0–46.0)
Hemoglobin: 8.7 g/dL — ABNORMAL LOW (ref 12.0–15.0)
Immature Granulocytes: 2 %
Lymphocytes Relative: 11 %
Lymphs Abs: 1 K/uL (ref 0.7–4.0)
MCH: 28.9 pg (ref 26.0–34.0)
MCHC: 30.7 g/dL (ref 30.0–36.0)
MCV: 94 fL (ref 80.0–100.0)
Monocytes Absolute: 0.6 K/uL (ref 0.1–1.0)
Monocytes Relative: 7 %
Neutro Abs: 7.4 K/uL (ref 1.7–7.7)
Neutrophils Relative %: 76 %
Platelet Count: 302 K/uL (ref 150–400)
RBC: 3.01 MIL/uL — ABNORMAL LOW (ref 3.87–5.11)
RDW: 15.1 % (ref 11.5–15.5)
WBC Count: 9.7 K/uL (ref 4.0–10.5)
nRBC: 0 % (ref 0.0–0.2)

## 2024-01-01 LAB — RETICULOCYTES
Immature Retic Fract: 16.8 % — ABNORMAL HIGH (ref 2.3–15.9)
RBC.: 3.02 MIL/uL — ABNORMAL LOW (ref 3.87–5.11)
Retic Count, Absolute: 56.5 K/uL (ref 19.0–186.0)
Retic Ct Pct: 1.9 % (ref 0.4–3.1)

## 2024-01-01 LAB — CMP (CANCER CENTER ONLY)
ALT: 8 U/L (ref 0–44)
AST: 14 U/L — ABNORMAL LOW (ref 15–41)
Albumin: 3.6 g/dL (ref 3.5–5.0)
Alkaline Phosphatase: 80 U/L (ref 38–126)
Anion gap: 10 (ref 5–15)
BUN: 36 mg/dL — ABNORMAL HIGH (ref 8–23)
CO2: 28 mmol/L (ref 22–32)
Calcium: 9.1 mg/dL (ref 8.9–10.3)
Chloride: 103 mmol/L (ref 98–111)
Creatinine: 1.36 mg/dL — ABNORMAL HIGH (ref 0.44–1.00)
GFR, Estimated: 41 mL/min — ABNORMAL LOW (ref >60)
Glucose, Bld: 215 mg/dL — ABNORMAL HIGH (ref 70–99)
Potassium: 4.3 mmol/L (ref 3.5–5.1)
Sodium: 141 mmol/L (ref 135–145)
Total Bilirubin: <0.2 mg/dL (ref 0.0–1.2)
Total Protein: 7.3 g/dL (ref 6.5–8.1)

## 2024-01-01 LAB — IRON AND IRON BINDING CAPACITY (CC-WL,HP ONLY)
Iron: 43 ug/dL (ref 28–170)
Saturation Ratios: 18 % (ref 10.4–31.8)
TIBC: 245 ug/dL — ABNORMAL LOW (ref 250–450)
UIBC: 202 ug/dL

## 2024-01-01 LAB — FERRITIN: Ferritin: 910 ng/mL — ABNORMAL HIGH (ref 11–307)

## 2024-01-01 NOTE — Progress Notes (Unsigned)
 Hematology/Oncology Consultation   Name: [name]      MRN: [id number]    Location: Room/bed info not found  Date: 01/01/2024 Time:11:37 AM   REFERRING PHYSICIAN:  Aleene Hockey, MD  REASON FOR CONSULT:  Normocytic anemia    DIAGNOSIS: Normocytic anemia, Labs pending - likely multifactorial   HISTORY OF PRESENT ILLNESS: [name] is a very pleasant 73 yo caucasian female with progressive anemia.  Hgb today is 8.7, MCV 94, platelets 302 and WBC count is 9.7.  She has had iron  deficiency in the past treated with IV iron  as well as requiring blood transfusion support during her hospitalization in August of this year.  She was admitted for 4 days for anemia, E coli UTI and CHF. She received blood, IV antibiotics and was diuresed with Lasix .  Iron  studies are stable at this time. Epo is pending.  She is symptomatic with weakness, fatigue taking 1 nap a day, palpitations and staying cold.  No known familial history of anemia.  She has an issue with frequent UTI's and states that she has been referred to urology.  She has history of stage IIIa endometrial cancer treated with total hysterectomy, chemo and radiation. She was previously seen by Dr. Lonn.  No known family history of cancer.  Her mammogram in August was negative.  She had EGD and colonoscopy in May. EGD showed erythema of the mucosa in the antrum and a few tiny erosions as ell as a small duodenal diverticulum. Colonoscopy showed multiple non bleeding diverticula in the sigmoid colon, diverticulosis appeared mild, a single polyp was removed from the ascending colon (pathology not uploaded in Epic) and small internal and external hemorrhoids were also noted.  She is a type II diabetic currently on insulin .  She has hypothyroidism and takes Synthroid  daily.  No smoking, ETOH or recreational drug use.  Appetite and hydration have been normal. Weight described as stable since being diuresed.  She has neuropathy in both hands and  lower extremities but notes this is more significant in the right side.  Generalized aches and pains in the back and all over with degenerative arthritis.  She is a retired Facilities manager.  She enjoys spending time with her sweet family and 2 precious Westies.   ROS: All other 10 point review of systems is negative.   PAST MEDICAL HISTORY:   Past Medical History:  Diagnosis Date   Allergic rhinitis    Allergy testing: results pending as of 05/28/16 (Dr. Fleeta Smock)   Anemia 10/2020   anemia of chronic dz (? + mild IDA?--iron  started   Chemotherapy-induced neuropathy (HCC) 2019   Chronic renal insufficiency, stage 3 (moderate) (HCC)    Cough variant asthma    DDD (degenerative disc disease), lumbar    Diabetes mellitus with complication (HCC)    Mild nonprolif DR R eye 12/2017-->referred to retinal specialist DM MANAGED BY DR. GHERGHE AS OF 2021   Endometrial cancer (HCC) 02/2018   REMISSION AS OF 08/2018.  Stage III (T3, Nx, Mx)  Path: endometroid adenocarcinoma involving cervix, uterus, and fallopian tubes (Pt got TAH & BSO 02/2018). No sign of recurrence as of onc f/u 05/2019.   Endometrial hyperplasia 07/02/2015   Treated with D&C and then Mirena IUD x 5 years.  IUD removed 3/17.     GERD (gastroesophageal reflux disease)    Hemorrhoids    Herpes zoster 10/2015   L side belt-line   History of adenomatous polyp of colon 02/25/2013   5  mm cecal polyp removed by Dr. Alfrieda 5 yrs   History of blood transfusion    History of cellulitis 08/2010   Left (Since replacemnt of Left knee   History of radiation therapy 05/21/2018   [facility] brachytherapy 04/24/2018-05/21/2018  Dr Lynwood Nasuti   Hyperkalemia 03/2018   started after pt started getting chemo-->had to stop enalapril .   Hyperlipidemia    Not on statin b/c lipids stayed down when sugars came down per pt.  She says Dr. Tommas knows she is not on statin anymore.   Hypertension    Hypothyroidism    IDA (iron  deficiency anemia)     GI ref 04/2023   Moderate persistent asthma    Nephrolithiasis 07/2005   Obesity    OSA on CPAP    Osteoarthritis    knees, ankle, + ? scapholunate ligament disruption (x-ray 06/2017).  Progressive arthritis L wrist and hand 09/2022 radiographs   Pancytopenia due to antineoplastic chemotherapy [facility])    progressive as of 06/2018. Stable 08/2018.   Recurrent UTI    started cipro  250 qd 09/2018   Sciatica of left side    Severe persistent asthma    cough-variant--saw Allergist 05/25/16 and was switched from max dose advair to symbicort .   Systolic murmur    ECHO fine 10/2016-->murmur likely flow murmur assoc with HTN.   Zoon's vulvitis    Bx-proven (GYN) -lichen sclerosis.  Clobetasol  0.05% ointment per GYN    ALLERGIES: Allergies  Allergen Reactions   Adhesive [Tape] Other (See Comments)    Takes patient's skin off.   Banana Diarrhea and Nausea Only   Egg-Derived Products Diarrhea and Nausea Only   Latex Other (See Comments)    sensativity   Linzess [Linaclotide] Diarrhea   Ozempic  (0.25 Or 0.5 Mg-Dose) [Semaglutide (0.25 Or 0.5mg -Dos)] Diarrhea   Penicillins Rash and Other (See Comments)    Has had cephalosporins without incident   Pine Swelling and Rash   Rose Swelling and Rash      MEDICATIONS:  Current Outpatient Medications on File Prior to Visit  Medication Sig Dispense Refill   albuterol  (PROVENTIL ) (2.5 MG/3ML) 0.083% nebulizer solution Take 3 mLs (2.5 mg total) by nebulization every 4 (four) hours as needed for wheezing or shortness of breath (Dx: J45.50). 75 mL 1   albuterol  (VENTOLIN  HFA) 108 (90 Base) MCG/ACT inhaler INHALE 2 PUFFS INTO LUNGS EVERY 6 HOURS AS NEEDED FOR WHEEZING/SHORTNESS OF BREATH 18 each 1   amitriptyline  (ELAVIL ) 25 MG tablet TAKE 2 TABLETS BY MOUTH AT BEDTIME. 60 tablet 1   clobetasol  ointment (TEMOVATE ) 0.05 % APPLY 1 APPLICATION TOPICALLY AS NEEDED. APPLY TO THE SKIN OF THE VULVA FOR ITCHING 30 g 2   diclofenac  Sodium (VOLTAREN ) 1 % GEL Apply 2 g  topically 4 (four) times daily. 100 g 3   felodipine  (PLENDIL ) 10 MG 24 hr tablet TAKE 1 TABLET BY MOUTH EVERY DAY 90 tablet 0   ferrous sulfate 325 (65 FE) MG EC tablet Take 325 mg by mouth at bedtime.     fluticasone  (CUTIVATE ) 0.05 % cream APPLY TO AFFECTED AREA OF RIGHT LOWER LEG TWICE PER DAY. 60 g 1   furosemide  (LASIX ) 40 MG tablet Take 1 tablet (40 mg total) by mouth daily. 30 tablet 1   HYDROcodone -acetaminophen  (NORCO) 10-325 MG tablet 1/2 - 1 tab po bid prn pain 45 tablet 0   Insulin  Aspart FlexPen (NOVOLOG ) 100 UNIT/ML NJECT 10-20 UNITS INTO THE SKIN 3 (THREE) 15 MINUTES BEFORE MEALS 45 mL 3  insulin  NPH Human (NOVOLIN  N) 100 UNIT/ML injection Inject 45-50 units under skin daily as advised 45 mL 3   Insulin  Pen Needle 32G X 4 MM MISC Use 3x a day 300 each 3   Insulin  Syringes, Disposable, U-100 0.3 ML MISC Use to inject insulin --6 injections per day 540 each 3   levocetirizine (XYZAL) 5 MG tablet Take 5 mg by mouth at bedtime.  5   levothyroxine  (SYNTHROID ) 125 MCG tablet TAKE 1 TABLET (125 MCG TOTAL) BY MOUTH DAILY. OFFICE VISIT NEEDED FOR FURTHER REFILLS 90 tablet 0   miconazole (MICOTIN) 2 % powder Apply 1 application topically 2 (two) times daily as needed for itching.     Phenazopyridine HCl (AZO URINARY PAIN PO) Take 2 tablets by mouth 3 (three) times daily as needed (urinary pain).     pravastatin  (PRAVACHOL ) 40 MG tablet TAKE 1 TABLET BY MOUTH EVERY DAY 90 tablet 1   Probiotic Product (PROBIOTIC PO) Take 1 capsule by mouth daily.     No current facility-administered medications on file prior to visit.     PAST SURGICAL HISTORY Past Surgical History:  Procedure Laterality Date   ANKLE FRACTURE SURGERY  1984   Pin & repair   ARTHROSCOPIC REPAIR ACL  2000   Due to ACL tear   ARTHROSCOPIC REPAIR ACL  08/2006   Blateral knee rerlacements x4 2 times each knee     BREAST BIOPSY Right 2014   Benign   CARDIAC CATHETERIZATION  08/2005   clear vessel mild mitral    CARPAL  TUNNEL RELEASE Right [id number]   1989 Left   CESAREAN SECTION  1985   CHOLECYSTECTOMY OPEN  1988   COLONOSCOPY N/A 02/25/2013   Tubular adenoma x 1: Recall 5 yrs. Procedure: COLONOSCOPY;  Surgeon: Renaye Sous, MD;  Location: [facility];  Service: Endoscopy;  Laterality: N/A;   COLONOSCOPY     08/22/2023 (IDA) [facility]--> 1 polyp, +diverticulosis.   COMBINED HYSTEROSCOPY DIAGNOSTIC / D&C  05/2010   Bx neg   DEXA  08/2007; 05/12/20   Bone density normal 2009 and 2022.  Plan rpt 2024.   DILATION AND CURETTAGE OF UTERUS  03/2010   ESOPHAGOGASTRODUODENOSCOPY     08/22/2023 [facility] (IDA): antral erythema, nothing to explain bleeding   IR IMAGING GUIDED PORT INSERTION  03/16/2018   IR REMOVAL TUN ACCESS W/ PORT W/O FL MOD SED  09/20/2018   LYMPH NODE BIOPSY N/A 02/19/2018   Procedure: Sentinel LYMPH NODE BIOPSY;  Surgeon: Eloy Herring, MD;  Location: [facility];  Service: Gynecology;  Laterality: N/A;   REPLACEMENT TOTAL KNEE Left 08/2010   X 2 on each   ROBOTIC ASSISTED TOTAL HYSTERECTOMY WITH BILATERAL SALPINGO OOPHERECTOMY N/A 02/19/2018   For endometrial cancer.  Procedure: XI ROBOTIC ASSISTED TOTAL HYSTERECTOMY WITH BILATERAL SALPINGO OOPHORECTOMY;  Surgeon: Eloy Herring, MD;  Location: [facility] [place];  Service: Gynecology;  Laterality: N/A;   TRANSTHORACIC ECHOCARDIOGRAM  10/11/2016   2018 EF 55-60%, grd I DD.  2025 normal   TUBAL LIGATION  1985   C-Section    FAMILY HISTORY: Family History  Problem Relation Age of Onset   Diabetes Father    COPD Father    Stroke Father    Celiac disease Brother    Congenital heart disease Brother    Rheum arthritis Brother    Thyroid  disease Brother    Diabetes Brother    Dementia Brother        Lewy Body  Alzheimer's disease Mother    Arthritis Mother    Other Son        Died age 36 -Tetrology of Fallot - VSD/pulmonary atresia   Breast cancer Other        postmenopausal when diagnosed    SOCIAL  HISTORY:  reports that she has never smoked. She has never used smokeless tobacco. She reports that she does not drink alcohol and does not use drugs.  PERFORMANCE STATUS: The patient's performance status is 1 - Symptomatic but completely ambulatory  PHYSICAL EXAM: Most Recent Vital Signs: Blood pressure (!) 155/54, pulse 84, temperature 98.2 F (36.8 C), temperature source Oral, resp. rate 18, last menstrual period 08/10/2010, SpO2 98%. BP (!) 155/54 (BP Location: Right Arm, Patient Position: Sitting)   Pulse 84   Temp 98.2 F (36.8 C) (Oral)   Resp 18   LMP 08/10/2010 Comment: Hysterectomy 02/02/18  SpO2 98%   General Appearance:    Alert, cooperative, no distress, appears stated age  Head:    Normocephalic, without obvious abnormality, atraumatic  Eyes:    PERRL, conjunctiva/corneas clear, EOM's intact, fundi    benign, both eyes        Throat:   Lips, mucosa, and tongue normal; teeth and gums normal  Neck:   Supple, symmetrical, trachea midline, no adenopathy;    thyroid :  no enlargement/tenderness/nodules; no carotid   bruit or JVD  Back:     Symmetric, no curvature, ROM normal, no CVA tenderness  Lungs:     Clear to auscultation bilaterally, respirations unlabored  Chest Wall:    No tenderness or deformity   Heart:    Regular rate and rhythm, S1 and S2 normal, no murmur, rub   or gallop     Abdomen:     Soft, non-tender, bowel sounds active all four quadrants,    no masses, no organomegaly        Extremities:   Extremities normal, atraumatic, no cyanosis or edema  Pulses:   2+ and symmetric all extremities  Skin:   Skin color, texture, turgor normal, no rashes or lesions  Lymph nodes:   Cervical, supraclavicular, and axillary nodes normal  Neurologic:   CNII-XII intact, normal strength, sensation and reflexes    throughout    LABORATORY DATA:  Results for orders placed or performed in visit on 01/01/24 (from the past 48 hours)  CBC with Differential (Cancer Center  Only)     Status: Abnormal   Collection Time: 01/01/24 11:13 AM  Result Value Ref Range   WBC Count 9.7 4.0 - 10.5 K/uL   RBC 3.01 (L) 3.87 - 5.11 MIL/uL   Hemoglobin 8.7 (L) 12.0 - 15.0 g/dL   HCT 71.6 (L) 63.9 - 53.9 %   MCV 94.0 80.0 - 100.0 fL   MCH 28.9 26.0 - 34.0 pg   MCHC 30.7 30.0 - 36.0 g/dL   RDW 84.8 88.4 - 84.4 %   Platelet Count 302 150 - 400 K/uL   nRBC 0.0 0.0 - 0.2 %   Neutrophils Relative % 76 %   Neutro Abs 7.4 1.7 - 7.7 K/uL   Lymphocytes Relative 11 %   Lymphs Abs 1.0 0.7 - 4.0 K/uL   Monocytes Relative 7 %   Monocytes Absolute 0.6 0.1 - 1.0 K/uL   Eosinophils Relative 4 %   Eosinophils Absolute 0.4 0.0 - 0.5 K/uL   Basophils Relative 0 %   Basophils Absolute 0.0 0.0 - 0.1 K/uL   Immature Granulocytes 2 %  Abs Immature Granulocytes 0.17 (H) 0.00 - 0.07 K/uL    Comment: Performed at [facility], [address] [place], [place] [address]  Reticulocytes     Status: Abnormal   Collection Time: 01/01/24 11:14 AM  Result Value Ref Range   Retic Ct Pct 1.9 0.4 - 3.1 %   RBC. 3.02 (L) 3.87 - 5.11 MIL/uL   Retic Count, Absolute 56.5 19.0 - 186.0 K/uL   Immature Retic Fract 16.8 (H) 2.3 - 15.9 %    Comment: Performed at [facility], [address] [place], [place] [address]      RADIOGRAPHY: No results found.     PATHOLOGY: None  ASSESSMENT/PLAN:  [name] is a very pleasant 73 yo caucasian female with progressive anemia.  Iron  studies are stable.  With her history of type II diabetes and CKD she will likely benefit from ESA injection plan. Epo level is pending.   Follow-up in   All questions were answered. The patient knows to call the clinic with any problems, questions or concerns. We can certainly see the patient much sooner if necessary.  The patient was discussed with Dr. Timmy and he is in agreement with the aforementioned.   Lauraine Pepper, NP

## 2024-01-03 ENCOUNTER — Encounter: Payer: Self-pay | Admitting: Obstetrics & Gynecology

## 2024-01-03 ENCOUNTER — Inpatient Hospital Stay: Admitting: Obstetrics & Gynecology

## 2024-01-03 ENCOUNTER — Other Ambulatory Visit: Payer: Self-pay | Admitting: Family Medicine

## 2024-01-03 VITALS — BP 138/47 | HR 94 | Temp 97.7°F | Resp 18 | Wt 223.0 lb

## 2024-01-03 DIAGNOSIS — Z8542 Personal history of malignant neoplasm of other parts of uterus: Secondary | ICD-10-CM | POA: Diagnosis not present

## 2024-01-03 DIAGNOSIS — Z08 Encounter for follow-up examination after completed treatment for malignant neoplasm: Secondary | ICD-10-CM | POA: Diagnosis not present

## 2024-01-03 DIAGNOSIS — C541 Malignant neoplasm of endometrium: Secondary | ICD-10-CM

## 2024-01-03 DIAGNOSIS — D649 Anemia, unspecified: Secondary | ICD-10-CM | POA: Diagnosis not present

## 2024-01-03 LAB — ERYTHROPOIETIN: Erythropoietin: 22.7 m[IU]/mL — ABNORMAL HIGH (ref 2.6–18.5)

## 2024-01-03 NOTE — Assessment & Plan Note (Addendum)
 73 yo with a h/o Endometrial Cancer: stage IIIA grade 1 endometrioid (MMR normal, MSI stable) s/p staging surgery on 02/19/18. S/p adjuvant carboplatin  and paclitaxel  x 6 completed April, 2020. S/p adjuvant vaginal brachy completed February, 2020.   Negative symptom review, normal exam.   Recent hospitalization with inflammatory changes on CTAP in the setting of a UTI  >Review the repeat CTAP to document resolution of the inflammatory changes >Follow up prn

## 2024-01-03 NOTE — Progress Notes (Signed)
 Follow Up Note: Gyn-Onc  [name] [place] 73 y.o. female  CC: She presents for a f/u visit   HPI: The oncology history was reviewed.  Interval History: Discussed the use of AI scribe software for clinical note transcription with the patient, who gave verbal consent to proceed.  History of Present Illness [name] is a 73 year old female who presents as a follow-up to a recent hospitalization.  She has been experiencing significant fatigue and exhaustion, which was attributed to anemia. She felt 'pretty exhausted' prior to treatment but reports improvement now. She was treated for UTI as well..  A CT scan was previously performed that showed inflammatory changes.    Review of prior data: CTAP 8/25: Extensive perinephric, peripelvic, and periureteric soft tissue infiltration. No significant hydronephrosis. No intrarenal or ureteral calculi. The findings may be inflammatory, as can be seen ascending urinary tract infection, or reflect infiltrative disease as can seen with lymphoma, sarcoidosis, or IgG4 related inflammatory disease Mild sigmoid diverticulosis.   Review of Systems  Review of Systems  Constitutional:  Negative for malaise/fatigue and weight loss.  Respiratory:  Negative for shortness of breath and wheezing.   Cardiovascular:  Negative for chest pain and leg swelling.  Gastrointestinal:  Negative for abdominal pain, blood in stool, constipation, nausea and vomiting.  Genitourinary:  Negative for dysuria, frequency, hematuria and urgency.  Musculoskeletal:  Negative for joint pain and myalgias.  Neurological:  Negative for weakness.  Psychiatric/Behavioral:  Negative for depression. The patient does not have insomnia.    Current medications, allergy, social history, past surgical history, past medical history, family history were all reviewed.    Vitals: BP (!) 138/47 (BP Location: Right Arm, Patient Position: Sitting)   Pulse 94   Temp 97.7 F (36.5 C)  (Oral)   Resp 18   Wt 223 lb (101.2 kg)   LMP 08/10/2010 Comment: Hysterectomy 02/02/18  SpO2 95%   BMI 42.14 kg/m     Physical Exam:  Physical Exam Exam conducted with a chaperone present.  Constitutional:      General: She is not in acute distress. Cardiovascular:     Rate and Rhythm: Normal rate and regular rhythm.  Pulmonary:     Effort: Pulmonary effort is normal.     Breath sounds: Normal breath sounds. No wheezing or rhonchi.  Abdominal:     Palpations: Abdomen is soft. Edema in pannus skin; erythematous plaques in the skin folds    Tenderness: There is no abdominal tenderness. There is no right CVA tenderness or left CVA tenderness.     Hernia: No hernia is present.  Genitourinary:    General: white, waxy areas; labia minora w/large erosive areas; thick, white discharge    Urethra: No urethral lesion.     Vagina: No lesions. No bleeding Musculoskeletal:     Cervical back: Neck supple.     Right lower leg: No edema.     Left lower leg: No edema.  Lymphadenopathy:     Upper Body:     Right upper body: No supraclavicular adenopathy.     Left upper body: No supraclavicular adenopathy.     Lower Body: No right inguinal adenopathy. No left inguinal adenopathy.  Neurological:     Mental Status: She is oriented to person, place, and time.   Assessment/Plan:  Endometrial cancer (HCC) 73 yo with a h/o Endometrial Cancer: stage IIIA grade 1 endometrioid (MMR normal, MSI stable) s/p staging surgery on 02/19/18. S/p adjuvant carboplatin  and paclitaxel  x  6 completed April, 2020. S/p adjuvant vaginal brachy completed February, 2020.   Negative symptom review, normal exam.   Recent hospitalization with inflammatory changes on CTAP in the setting of a UTI  >Review the repeat CTAP to document resolution of the inflammatory changes >Follow up prn      I personally spent 25 minutes face-to-face and non-face-to-face in the care of this patient, which includes all pre, intra, and  post visit time on the date of service.       Allison Mill, MD

## 2024-01-04 ENCOUNTER — Ambulatory Visit (HOSPITAL_BASED_OUTPATIENT_CLINIC_OR_DEPARTMENT_OTHER)
Admission: RE | Admit: 2024-01-04 | Discharge: 2024-01-04 | Disposition: A | Discharge status: Home / Self Care | Source: Ambulatory Visit | Attending: Obstetrics & Gynecology | Admitting: Obstetrics & Gynecology

## 2024-01-04 DIAGNOSIS — C539 Malignant neoplasm of cervix uteri, unspecified: Secondary | ICD-10-CM | POA: Diagnosis not present

## 2024-01-04 DIAGNOSIS — C541 Malignant neoplasm of endometrium: Secondary | ICD-10-CM | POA: Diagnosis not present

## 2024-01-04 DIAGNOSIS — N1330 Unspecified hydronephrosis: Secondary | ICD-10-CM | POA: Diagnosis not present

## 2024-01-04 DIAGNOSIS — R590 Localized enlarged lymph nodes: Secondary | ICD-10-CM | POA: Diagnosis not present

## 2024-01-04 MED ORDER — IOHEXOL 300 MG/ML  SOLN
85.0000 mL | Freq: Once | INTRAMUSCULAR | Status: AC | PRN
  Administered 2024-01-04: 85 mL via INTRAVENOUS

## 2024-01-05 ENCOUNTER — Encounter: Payer: Self-pay | Admitting: Family

## 2024-01-05 ENCOUNTER — Other Ambulatory Visit: Payer: Self-pay | Admitting: Family Medicine

## 2024-01-05 DIAGNOSIS — D631 Anemia in chronic kidney disease: Secondary | ICD-10-CM | POA: Insufficient documentation

## 2024-01-08 ENCOUNTER — Other Ambulatory Visit: Payer: Self-pay | Admitting: Family Medicine

## 2024-01-09 DIAGNOSIS — N1832 Chronic kidney disease, stage 3b: Secondary | ICD-10-CM | POA: Diagnosis not present

## 2024-01-09 DIAGNOSIS — D631 Anemia in chronic kidney disease: Secondary | ICD-10-CM | POA: Diagnosis not present

## 2024-01-09 DIAGNOSIS — N200 Calculus of kidney: Secondary | ICD-10-CM | POA: Diagnosis not present

## 2024-01-09 DIAGNOSIS — E785 Hyperlipidemia, unspecified: Secondary | ICD-10-CM | POA: Diagnosis not present

## 2024-01-09 DIAGNOSIS — E1122 Type 2 diabetes mellitus with diabetic chronic kidney disease: Secondary | ICD-10-CM | POA: Diagnosis not present

## 2024-01-09 DIAGNOSIS — I509 Heart failure, unspecified: Secondary | ICD-10-CM | POA: Diagnosis not present

## 2024-01-10 ENCOUNTER — Telehealth: Payer: Self-pay | Admitting: *Deleted

## 2024-01-10 ENCOUNTER — Encounter: Payer: Self-pay | Admitting: Family Medicine

## 2024-01-10 NOTE — Telephone Encounter (Addendum)
 Spoke with [name] and relayed message from Allison Epps, NP of patient's CT scan results as stated.   Advised patient that Dr. Rogelio wants her to get in with her Primary Care Provider to have urine tested ASAP  based on CT scan results for the findings concerning for urinary tract infection. Chronic findings slightly worsened from prior.  Dr. Rogelio also recommends seeing a urologist given these finding.   Patient states she just saw  Dr. Melia yesterday at [facility] in [place]. And has a follow up again in 4 months. Pt is currently not experiencing any uti symptoms, but has in the past on and off and had a UTI in early August.   Advised patient results of her CT scan will be faxed to her PCP Dr. Candise and Dr. Melia at [facility].   Pt verbalized understanding and thanked the office for calling.   CT scan results faxed to Aleene Candise, MD at [phone].  CT scan results faxed to Dr. Lynwood [name] with [facility] at [phone].

## 2024-01-10 NOTE — Telephone Encounter (Signed)
-----   Message from Eleanor JONETTA Epps sent at 01/10/2024  3:06 PM EDT ----- Please call the patient with her CT scan results:  IMPRESSION: 1. No evidence of cervical (SHE HAS ENDOMETRIAL, NOT CERVICAL) cancer recurrence or metastasis. NO EVIDENCE OF GYN CANCER RETURN OR SPREAD.  2. Bilateral hydronephrosis and hydroureter with urothelial enhancement. Findings concerning for urinary tract infection. No obstructing lesion identified. Chronic findings slightly worsened from prior. DR J-M WANTS HER TO GET IN WITH PCP TO HAVE HER URINE TESTED AND SHE RECOMMENDS SEEING A UROLOGIST GIVEN THESE FINDINGS. HAS SHE SEEN ON IN THE PAST??  3. Upper abdominal retroperitoneal adenopathy favored related to chronic renal findings described above. (THEY SEE ENLARGED LYMPH NODES IN THE UPPER ABD AROUND THE KIDNEY WHICH THEY FEEL ARE RELATED TO THE KIDNEY FINDINGS).  4. Severe lumbosacral degenerative change with lytic endplate changes from L3-S1. (THEY SEE SEVERE ARTHRITIS (DEGEN) CHANGES OF THE SPINE. FOLLOW WITH PCP FOR THIS.)  PLEASE TELL PATIENT TO GET IN WITH PCP/OR APP IN THE OFFICE ASAP TO GIVE URINE SAMPLE AND THEN LET ME KNOW ABOUT UROLOGIST. ALSO FAX THESE RESULTS TO PCP.

## 2024-01-11 ENCOUNTER — Inpatient Hospital Stay: Attending: Obstetrics & Gynecology

## 2024-01-11 VITALS — BP 127/47 | HR 85 | Temp 98.4°F | Resp 18

## 2024-01-11 DIAGNOSIS — D631 Anemia in chronic kidney disease: Secondary | ICD-10-CM | POA: Insufficient documentation

## 2024-01-11 DIAGNOSIS — Z923 Personal history of irradiation: Secondary | ICD-10-CM | POA: Diagnosis not present

## 2024-01-11 DIAGNOSIS — Z9221 Personal history of antineoplastic chemotherapy: Secondary | ICD-10-CM | POA: Diagnosis not present

## 2024-01-11 DIAGNOSIS — Z90710 Acquired absence of both cervix and uterus: Secondary | ICD-10-CM | POA: Diagnosis not present

## 2024-01-11 DIAGNOSIS — D500 Iron deficiency anemia secondary to blood loss (chronic): Secondary | ICD-10-CM | POA: Insufficient documentation

## 2024-01-11 DIAGNOSIS — E611 Iron deficiency: Secondary | ICD-10-CM | POA: Diagnosis not present

## 2024-01-11 DIAGNOSIS — N1831 Chronic kidney disease, stage 3a: Secondary | ICD-10-CM | POA: Insufficient documentation

## 2024-01-11 DIAGNOSIS — N1832 Chronic kidney disease, stage 3b: Secondary | ICD-10-CM | POA: Insufficient documentation

## 2024-01-11 DIAGNOSIS — Z8542 Personal history of malignant neoplasm of other parts of uterus: Secondary | ICD-10-CM | POA: Insufficient documentation

## 2024-01-11 DIAGNOSIS — D649 Anemia, unspecified: Secondary | ICD-10-CM

## 2024-01-11 MED ORDER — DARBEPOETIN ALFA 300 MCG/0.6ML IJ SOSY
300.0000 ug | PREFILLED_SYRINGE | Freq: Once | INTRAMUSCULAR | Status: AC
  Administered 2024-01-11: 300 ug via SUBCUTANEOUS
  Filled 2024-01-11: qty 0.6

## 2024-01-11 NOTE — Patient Instructions (Addendum)

## 2024-01-14 ENCOUNTER — Other Ambulatory Visit: Payer: Self-pay | Admitting: Internal Medicine

## 2024-01-25 NOTE — Progress Notes (Signed)
 Subjective:   [name] is a 73 y.o. female who presents for Medicare Annual (Subsequent) preventive examination.  Visit Complete: In person  Patient Medicare AWV questionnaire was completed by the patient on 01/26/24; I have confirmed that all information answered by patient is correct and no changes since this date.  Cardiac Risk Factors include: advanced age (>50men, >37 women);diabetes mellitus;dyslipidemia;hypertension;obesity (BMI >30kg/m2);sedentary lifestyle     Objective:    Today's Vitals   01/26/24 0956 01/26/24 1008  BP: (!) 173/73 138/78  Pulse: 87   Temp: (!) 97.5 F (36.4 C)   TempSrc: Oral   SpO2: 94%   Weight: 223 lb 9.6 oz (101.4 kg)   Height: 5' 1 (1.549 m)    Body mass index is 42.25 kg/m.     01/26/2024    4:46 PM 01/11/2024   11:39 AM 01/01/2024   11:38 AM 11/21/2023   10:00 AM 11/20/2023    3:14 PM 01/19/2023   11:30 AM 06/08/2022    1:43 PM  Advanced Directives  Does Patient Have a Medical Advance Directive? No No No  No Yes Yes  Type of Careers adviser;Living will Healthcare Power of [place];Living will  Does patient want to make changes to medical advance directive?  No - Patient declined No - Patient declined      Copy of Healthcare Power of Attorney in Chart?       No - copy requested  Would patient like information on creating a medical advance directive?    No - Patient declined       Current Medications (verified) Outpatient Encounter Medications as of 01/26/2024  Medication Sig   albuterol  (PROVENTIL ) (2.5 MG/3ML) 0.083% nebulizer solution Take 3 mLs (2.5 mg total) by nebulization every 4 (four) hours as needed for wheezing or shortness of breath (Dx: J45.50).   albuterol  (VENTOLIN  HFA) 108 (90 Base) MCG/ACT inhaler INHALE 2 PUFFS INTO LUNGS EVERY 6 HOURS AS NEEDED FOR WHEEZING/SHORTNESS OF BREATH   amitriptyline  (ELAVIL ) 25 MG tablet TAKE 2 TABLETS BY MOUTH AT BEDTIME.   clobetasol  ointment  (TEMOVATE ) 0.05 % APPLY 1 APPLICATION TOPICALLY AS NEEDED. APPLY TO THE SKIN OF THE VULVA FOR ITCHING   diclofenac  Sodium (VOLTAREN ) 1 % GEL Apply 2 g topically 4 (four) times daily.   ferrous sulfate 325 (65 FE) MG EC tablet Take 325 mg by mouth at bedtime.   fluticasone  (CUTIVATE ) 0.05 % cream APPLY TO AFFECTED AREA OF RIGHT LOWER LEG TWICE PER DAY.   furosemide  (LASIX ) 40 MG tablet Take 1 tablet (40 mg total) by mouth daily.   Insulin  Aspart FlexPen (NOVOLOG ) 100 UNIT/ML NJECT 10-20 UNITS INTO THE SKIN 3 (THREE) 15 MINUTES BEFORE MEALS   insulin  NPH Human (NOVOLIN  N) 100 UNIT/ML injection INJECT 45-50 UNITS UNDER SKIN DAILY AS ADVISED   Insulin  Pen Needle 32G X 4 MM MISC Use 3x a day   Insulin  Syringes, Disposable, U-100 0.3 ML MISC Use to inject insulin --6 injections per day   levocetirizine (XYZAL) 5 MG tablet Take 5 mg by mouth at bedtime.   miconazole (MICOTIN) 2 % powder Apply 1 application topically 2 (two) times daily as needed for itching.   Phenazopyridine HCl (AZO URINARY PAIN PO) Take 2 tablets by mouth 3 (three) times daily as needed (urinary pain).   Probiotic Product (PROBIOTIC PO) Take 1 capsule by mouth daily.   felodipine  (PLENDIL ) 10 MG 24 hr tablet Take 1 tablet (10 mg total)  by mouth daily.   HYDROcodone -acetaminophen  (NORCO) 10-325 MG tablet 1/2 - 1 tab po bid prn pain   levothyroxine  (SYNTHROID ) 125 MCG tablet Take 1 tablet (125 mcg total) by mouth daily before breakfast.   pravastatin  (PRAVACHOL ) 40 MG tablet Take 1 tablet (40 mg total) by mouth daily.   [DISCONTINUED] felodipine  (PLENDIL ) 10 MG 24 hr tablet TAKE 1 TABLET BY MOUTH EVERY DAY   [DISCONTINUED] HYDROcodone -acetaminophen  (NORCO) 10-325 MG tablet 1/2 - 1 tab po bid prn pain   [DISCONTINUED] levothyroxine  (SYNTHROID ) 125 MCG tablet Take 1 tablet (125 mcg total) by mouth daily before breakfast.   [DISCONTINUED] pravastatin  (PRAVACHOL ) 40 MG tablet TAKE 1 TABLET BY MOUTH EVERY DAY   No facility-administered  encounter medications on file as of 01/26/2024.    Allergies (verified) Adhesive [tape], Banana, Egg protein-containing drug products, Latex, Linzess [linaclotide], Ozempic  (0.25 or 0.5 mg-dose) [semaglutide (0.25 or 0.5mg -dos)], Penicillins, Pine, and Rose   History: Past Medical History:  Diagnosis Date   Allergic rhinitis    Allergy testing: results pending as of 05/28/16 (Dr. Fleeta Smock)   Anemia 10/2020   anemia of chronic dz (? + mild IDA?--iron  started   Chemotherapy-induced neuropathy 2019   Chronic renal insufficiency, stage 3 (moderate)    Cough variant asthma    DDD (degenerative disc disease), lumbar    Diabetes mellitus with complication (HCC)    Mild nonprolif DR R eye 12/2017-->referred to retinal specialist DM MANAGED BY DR. GHERGHE AS OF 2021   Endometrial cancer (HCC) 02/2018   REMISSION AS OF 08/2018.  Stage III (T3, Nx, Mx)  Path: endometroid adenocarcinoma involving cervix, uterus, and fallopian tubes (Pt got TAH & BSO 02/2018). No sign of recurrence as of onc f/u 05/2019.   Endometrial hyperplasia 07/02/2015   Treated with D&C and then Mirena IUD x 5 years.  IUD removed 3/17.     GERD (gastroesophageal reflux disease)    Hemorrhoids    Herpes zoster 10/2015   L side belt-line   History of adenomatous polyp of colon 02/25/2013   5 mm cecal polyp removed by Dr. Alfrieda 5 yrs   History of blood transfusion    History of cellulitis 08/2010   Left (Since replacemnt of Left knee   History of radiation therapy 05/21/2018   [facility] brachytherapy 04/24/2018-05/21/2018  Dr Lynwood Nasuti   Hyperkalemia 03/2018   started after pt started getting chemo-->had to stop enalapril .   Hyperlipidemia    Not on statin b/c lipids stayed down when sugars came down per pt.  She says Dr. Tommas knows she is not on statin anymore.   Hypertension    Hypothyroidism    IDA (iron  deficiency anemia)    08/2023 EGD and colon nl other than mild chronic inactive gastritis (h pylori NEG) and  one small adenoma and   Moderate persistent asthma    Nephrolithiasis 07/2005   Obesity    OSA on CPAP    Osteoarthritis    knees, ankle, + ? scapholunate ligament disruption (x-ray 06/2017).  Progressive arthritis L wrist and hand 09/2022 radiographs   Pancytopenia due to antineoplastic chemotherapy    progressive as of 06/2018. Stable 08/2018.   Recurrent UTI    started cipro  250 qd 09/2018   Sciatica of left side    Severe persistent asthma (HCC)    cough-variant--saw Allergist 05/25/16 and was switched from max dose advair to symbicort .   Systolic murmur    ECHO fine 10/2016-->murmur likely flow murmur assoc with HTN.  Zoon's vulvitis    Bx-proven (GYN) -lichen sclerosis.  Clobetasol  0.05% ointment per GYN   Past Surgical History:  Procedure Laterality Date   ANKLE FRACTURE SURGERY  1984   Pin & repair   ARTHROSCOPIC REPAIR ACL  2000   Due to ACL tear   ARTHROSCOPIC REPAIR ACL  08/2006   Blateral knee rerlacements x4 2 times each knee     BREAST BIOPSY Right 2014   Benign   CARDIAC CATHETERIZATION  08/2005   clear vessel mild mitral    CARPAL TUNNEL RELEASE Right [id number]   1989 Left   CESAREAN SECTION  1985   CHOLECYSTECTOMY OPEN  1988   COLONOSCOPY N/A 02/25/2013   Tubular adenoma x 1: Recall 5 yrs. Procedure: COLONOSCOPY;  Surgeon: Renaye Sous, MD;  Location: [facility];  Service: Endoscopy;  Laterality: N/A;   COLONOSCOPY     08/22/2023 (IDA) [facility]--> 1 polyp, +diverticulosis.   COMBINED HYSTEROSCOPY DIAGNOSTIC / D&C  05/2010   Bx neg   DEXA  08/2007; 05/12/20   Bone density normal 2009 and 2022.  Plan rpt 2024.   DILATION AND CURETTAGE OF UTERUS  03/2010   ESOPHAGOGASTRODUODENOSCOPY     08/22/2023 [facility] (IDA): antral erythema, nothing to explain bleeding   IR IMAGING GUIDED PORT INSERTION  03/16/2018   IR REMOVAL TUN ACCESS W/ PORT W/O FL MOD SED  09/20/2018   LYMPH NODE BIOPSY N/A 02/19/2018   Procedure: Sentinel LYMPH NODE BIOPSY;  Surgeon: Eloy Herring, MD;  Location: [facility];  Service: Gynecology;  Laterality: N/A;   REPLACEMENT TOTAL KNEE Left 08/2010   X 2 on each   ROBOTIC ASSISTED TOTAL HYSTERECTOMY WITH BILATERAL SALPINGO OOPHERECTOMY N/A 02/19/2018   For endometrial cancer.  Procedure: XI ROBOTIC ASSISTED TOTAL HYSTERECTOMY WITH BILATERAL SALPINGO OOPHORECTOMY;  Surgeon: Eloy Herring, MD;  Location: [facility];  Service: Gynecology;  Laterality: N/A;   TRANSTHORACIC ECHOCARDIOGRAM  10/11/2016   2018 EF 55-60%, grd I DD.  2025 normal   TUBAL LIGATION  1985   C-Section   Family History  Problem Relation Age of Onset   Diabetes Father    COPD Father    Stroke Father    Celiac disease Brother    Congenital heart disease Brother    Rheum arthritis Brother    Thyroid  disease Brother    Diabetes Brother    Dementia Brother        Lewy Body   Alzheimer's disease Mother    Arthritis Mother    Other Son        Died age 19 -Tetrology of Fallot - VSD/pulmonary atresia   Breast cancer Other        postmenopausal when diagnosed   Social History   Socioeconomic History   Marital status: Married    Spouse name: Darina   Number of children: 2   Years of education: Not on file   Highest education level: Bachelor's degree (e.g., BA, AB, BS)  Occupational History   Occupation: retired Charity fundraiser  Tobacco Use   Smoking status: Never   Smokeless tobacco: Never  Vaping Use   Vaping status: Never Used  Substance and Sexual Activity   Alcohol use: No    Alcohol/week: 0.0 standard drinks of alcohol   Drug use: No   Sexual activity: Never    Partners: Male    Birth control/protection: Post-menopausal  Other Topics Concern   Not on file  Social History Narrative   Not  on file   Social Drivers of Health   Financial Resource Strain: Low Risk  (01/26/2024)   Overall Financial Resource Strain (CARDIA)    Difficulty of Paying Living Expenses: Not hard at all  Food Insecurity: No Food Insecurity  (01/26/2024)   Hunger Vital Sign    Worried About Running Out of Food in the Last Year: Never true    Ran Out of Food in the Last Year: Never true  Transportation Needs: No Transportation Needs (01/26/2024)   PRAPARE - Administrator, Civil Service (Medical): No    Lack of Transportation (Non-Medical): No  Physical Activity: Unknown (01/26/2024)   Exercise Vital Sign    Days of Exercise per Week: Patient declined    Minutes of Exercise per Session: Not on file  Recent Concern: Physical Activity - Inactive (11/16/2023)   Exercise Vital Sign    Days of Exercise per Week: 0 days    Minutes of Exercise per Session: Not on file  Stress: No Stress Concern Present (01/26/2024)   [employer] of Occupational Health - Occupational Stress Questionnaire    Feeling of Stress: Not at all  Social Connections: Unknown (01/26/2024)   Social Connection and Isolation Panel    Frequency of Communication with Friends and Family: Twice a week    Frequency of Social Gatherings with Friends and Family: Three times a week    Attends Religious Services: Patient declined    Active Member of Clubs or Organizations: Patient declined    Attends Banker Meetings: Not on file    Marital Status: Married    Tobacco Counseling Counseling given: Not Answered   Clinical Intake:  Pre-visit preparation completed: No  Pain : No/denies pain     Nutritional Risks: None Diabetes: Yes CBG done?: No Did pt. bring in CBG monitor from home?: No  How often do you need to have someone help you when you read instructions, pamphlets, or other written materials from your doctor or pharmacy?: 1 - Never  Interpreter Needed?: No      Activities of Daily Living    01/26/2024    4:47 PM 11/21/2023    6:13 PM  In your present state of health, do you have any difficulty performing the following activities:  Hearing? 0   Vision? 0   Difficulty concentrating or making decisions? 0    Walking or climbing stairs? 1   Dressing or bathing? 0   Doing errands, shopping? 0 0  Preparing Food and eating ? N   Using the Toilet? N   In the past six months, have you accidently leaked urine? N   Do you have problems with loss of bowel control? N   Managing your Medications? N   Managing your Finances? N   Housekeeping or managing your Housekeeping? N     Patient Care Team: Candise Aleene DEL, MD as PCP - General (Family Medicine) Fleeta Smock, Lamar BROCKS, MD as Consulting Physician (Allergy and Immunology) Kristie Lamprey, MD as Consulting Physician (Gastroenterology) [place], Peter M, MD as Consulting Physician (Cardiology) Cleotilde Ronal RAMAN, MD as Consulting Physician (Gynecology) Anitra Freddy NOVAK, MD (Inactive) as Consulting Physician (Gynecologic Oncology) Eloy Herring, MD as Consulting Physician (Gynecologic Oncology) Lonn Hicks, MD as Consulting Physician (Hematology and Oncology) Trixie File, MD as Consulting Physician (Endocrinology)  Indicate any recent Medical Services you may have received from other than [facility] providers in the past year (date may be approximate).     Assessment:   This is  a routine wellness examination for [place].  Hearing/Vision screen No results found.   Goals Addressed   None    Depression Screen    01/26/2024    4:46 PM 01/11/2024   11:43 AM 01/01/2024   11:27 AM 12/01/2023    9:44 AM 11/27/2023    3:54 PM 12/20/2022   10:06 AM 06/08/2022    1:42 PM  PHQ 2/9 Scores  PHQ - 2 Score 0 0 0 0 0 0 0  PHQ- 9 Score      5     Fall Risk    12/01/2023    9:43 AM 11/27/2023    3:51 PM 06/07/2022   11:56 PM 03/15/2022    1:36 PM 03/15/2022    9:01 AM  Fall Risk   Falls in the past year? 1 1 1 1 1   Number falls in past yr: 1 1 1 1 1   Injury with Fall? 1 1 0 0 0  Risk for fall due to : History of fall(s) History of fall(s) Impaired balance/gait;Impaired vision;Impaired mobility History of fall(s)   Follow up Falls evaluation completed  Falls  prevention discussed Falls evaluation completed       Data saved with a previous flowsheet row definition    MEDICARE RISK AT HOME: Medicare Risk at Home Any stairs in or around the home?: Yes If so, are there any without handrails?: No Home free of loose throw rugs in walkways, pet beds, electrical cords, etc?: Yes Adequate lighting in your home to reduce risk of falls?: Yes Life alert?: No Use of a cane, walker or w/c?: Yes Grab bars in the bathroom?: Yes Shower chair or bench in shower?: No Elevated toilet seat or a handicapped toilet?: Yes  TIMED UP AND GO:  Was the test performed?  Yes  Length of time to ambulate 10 feet: 12 sec Gait slow and steady with assistive device    Cognitive Function:        01/26/2024    4:47 PM 06/08/2022    1:45 PM 05/05/2021    1:07 PM  6CIT Screen  What Year? 0 points 0 points 0 points  What month? 0 points 0 points 0 points  What time? 0 points 0 points 0 points  Count back from 20 0 points 0 points 0 points  Months in reverse 0 points 0 points 0 points  Repeat phrase 0 points 0 points 2 points  Total Score 0 points 0 points 2 points    Immunizations Immunization History  Administered Date(s) Administered   DT (Pediatric) 04/11/1992   Influenza Inj Mdck Quad Pf 12/18/2016   Influenza, Quadrivalent, Recombinant, Inj, Pf 12/28/2015, 12/30/2018   Influenza,trivalent, recombinat, inj, PF 03/20/2015   Influenza-Unspecified 01/08/2018, 01/15/2020   Moderna Sars-Covid-2 Vaccination 07/01/2019, 08/01/2019   Pneumococcal Conjugate-13 06/28/2017   Pneumococcal Polysaccharide-23 04/12/1999, 05/03/2016   Td 08/10/2006   Tdap 10/17/2016    TDAP status: Up to date  Flu Vaccine status: Due, Education has been provided regarding the importance of this vaccine. Advised may receive this vaccine at local pharmacy or Health Dept. Aware to provide a copy of the vaccination record if obtained from local pharmacy or Health Dept. Verbalized  acceptance and understanding.  Pneumococcal vaccine status: Up to date  Covid-19 vaccine status: Completed vaccines  Qualifies for Shingles Vaccine? Yes   Zostavax completed No   Shingrix Completed?: No.    Education has been provided regarding the importance of this vaccine. Patient has been advised to call  insurance company to determine out of pocket expense if they have not yet received this vaccine. Advised may also receive vaccine at local pharmacy or Health Dept. Verbalized acceptance and understanding.  Screening Tests Health Maintenance  Topic Date Due   Zoster Vaccines- Shingrix (1 of 2) Never done   Cervical Cancer Screening (Pap smear)  11/04/2017   COVID-19 Vaccine (3 - Moderna risk series) 08/29/2019   OPHTHALMOLOGY EXAM  01/05/2022   Influenza Vaccine  07/09/2024 (Originally 11/10/2023)   HEMOGLOBIN A1C  04/19/2024   Diabetic kidney evaluation - Urine ACR  10/17/2024   FOOT EXAM  10/17/2024   Mammogram  11/30/2024   Diabetic kidney evaluation - eGFR measurement  12/31/2024   Medicare Annual Wellness (AWV)  01/25/2025   DTaP/Tdap/Td (4 - Td or Tdap) 10/18/2026   Colonoscopy  08/21/2033   Pneumococcal Vaccine: 50+ Years  Completed   DEXA SCAN  Completed   Hepatitis C Screening  Completed   Meningococcal B Vaccine  Aged Out    Health Maintenance  Health Maintenance Due  Topic Date Due   Zoster Vaccines- Shingrix (1 of 2) Never done   Cervical Cancer Screening (Pap smear)  11/04/2017   COVID-19 Vaccine (3 - Moderna risk series) 08/29/2019   OPHTHALMOLOGY EXAM  01/05/2022    Colorectal cancer screening: Type of screening: Colonoscopy. Completed 08/22/23. Repeat every 10 years  Mammogram status: Completed 12/01/23. Repeat every year  Bone Density status: Completed 05/13/23. Results reflect: Bone density results: NORMAL. Repeat every 2 years.  Lung Cancer Screening: (Low Dose CT Chest recommended if Age 52-80 years, 20 pack-year currently smoking OR have quit w/in  15years.) does not qualify.   Lung Cancer Screening Referral: n/a  Additional Screening:  Hepatitis C Screening: does qualify; Completed 06/28/2017  Vision Screening: Recommended annual ophthalmology exams for early detection of glaucoma and other disorders of the eye. Is the patient up to date with their annual eye exam?  No  Who is the provider or what is the name of the office in which the patient attends annual eye exams? N/a If pt is not established with a provider, would they like to be referred to a provider to establish care? No .   Dental Screening: Recommended annual dental exams for proper oral hygiene  Diabetic Foot Exam: Diabetic Foot Exam: Completed 10/18/23  Community Resource Referral / Chronic Care Management: CRR required this visit?  No   CCM required this visit?  No     Plan:     I have personally reviewed and noted the following in the patient's chart:   Medical and social history Use of alcohol, tobacco or illicit drugs  Current medications and supplements including opioid prescriptions. Patient is not currently taking opioid prescriptions. Functional ability and status Nutritional status Physical activity Advanced directives List of other physicians Hospitalizations, surgeries, and ER visits in previous 12 months Vitals Screenings to include cognitive, depression, and falls Referrals and appointments  In addition, I have reviewed and discussed with patient certain preventive protocols, quality metrics, and best practice recommendations. A written personalized care plan for preventive services as well as general preventive health recommendations were provided to patient.     Shanda LELON Sharps, CMA   01/26/2024

## 2024-01-25 NOTE — Patient Instructions (Addendum)

## 2024-01-26 ENCOUNTER — Ambulatory Visit: Admitting: Family Medicine

## 2024-01-26 ENCOUNTER — Encounter: Payer: Self-pay | Admitting: Family Medicine

## 2024-01-26 ENCOUNTER — Encounter: Payer: Self-pay | Admitting: Internal Medicine

## 2024-01-26 VITALS — BP 138/78 | HR 87 | Temp 97.5°F | Ht 61.0 in | Wt 223.6 lb

## 2024-01-26 DIAGNOSIS — Z0000 Encounter for general adult medical examination without abnormal findings: Secondary | ICD-10-CM | POA: Diagnosis not present

## 2024-01-26 DIAGNOSIS — D631 Anemia in chronic kidney disease: Secondary | ICD-10-CM | POA: Diagnosis not present

## 2024-01-26 DIAGNOSIS — G894 Chronic pain syndrome: Secondary | ICD-10-CM

## 2024-01-26 DIAGNOSIS — M150 Primary generalized (osteo)arthritis: Secondary | ICD-10-CM

## 2024-01-26 DIAGNOSIS — Z79899 Other long term (current) drug therapy: Secondary | ICD-10-CM | POA: Diagnosis not present

## 2024-01-26 DIAGNOSIS — N1832 Chronic kidney disease, stage 3b: Secondary | ICD-10-CM

## 2024-01-26 DIAGNOSIS — N2889 Other specified disorders of kidney and ureter: Secondary | ICD-10-CM

## 2024-01-26 DIAGNOSIS — N390 Urinary tract infection, site not specified: Secondary | ICD-10-CM | POA: Diagnosis not present

## 2024-01-26 MED ORDER — PRAVASTATIN SODIUM 40 MG PO TABS: 40.0000 mg | ORAL_TABLET | Freq: Every day | ORAL | 1 refills | Status: AC

## 2024-01-26 MED ORDER — LEVOTHYROXINE SODIUM 125 MCG PO TABS: 125.0000 ug | ORAL_TABLET | Freq: Every day | ORAL | 3 refills | Status: AC

## 2024-01-26 MED ORDER — HYDROCODONE-ACETAMINOPHEN 10-325 MG PO TABS: ORAL_TABLET | ORAL | 0 refills | Status: DC

## 2024-01-26 MED ORDER — FELODIPINE ER 10 MG PO TB24: 10.0000 mg | ORAL_TABLET | Freq: Every day | ORAL | 3 refills | Status: AC

## 2024-01-26 NOTE — Progress Notes (Signed)
 OFFICE VISIT  01/26/2024  CC: No chief complaint on file.   [name] who presents accompanied by her husband for follow-up chronic pain syndrome, recurrent UTI, and chronic renal insufficiency.  INTERIM HX: Doing okay.  She does not have any urinary urgency, frequency, or burning.  No flank pain or blood in urine. She has anemia due to chronic renal insufficiency. She got darbepoetin infusion 01/11/24. She does feel like her energy level is improving. No lower extremity swelling.  No shortness of breath or dyspnea on exertion or chest pain.  Indication for chronic opioid: osteoarthritis in hands, low back, feet, L hip, knees, wrists+chemotherapy -induced neuropathy (burning and numbness in feet). Takes 1-2 hydrocodone  tabs a day typically.  Not NSAID candidate due to renal insufficiency. Pain is stable on current regimen. PMP AWARE reviewed today: most recent rx for Vicodin 10/325 was filled 12/08/2023, # 45, rx by me. No red flags.  ROS as above, plus--> no fevers, no wheezing, no cough, no dizziness, no HAs, no rashes, no melena/hematochezia.  No polyuria or polydipsia.   No focal weakness, paresthesias, or tremors.  No acute vision or hearing abnormalities. No n/v/d or abd pain.  No palpitations.    Past Medical History:  Diagnosis Date   Allergic rhinitis    Allergy testing: results pending as of 05/28/16 (Dr. Fleeta Smock)   Anemia 10/2020   anemia of chronic dz (? + mild IDA?--iron  started   Chemotherapy-induced neuropathy 2019   Chronic renal insufficiency, stage 3 (moderate)    Cough variant asthma    DDD (degenerative disc disease), lumbar    Diabetes mellitus with complication (HCC)    Mild nonprolif DR R eye 12/2017-->referred to retinal specialist DM MANAGED BY DR. GHERGHE AS OF 2021   Endometrial cancer (HCC) 02/2018   REMISSION AS OF 08/2018.  Stage III (T3, Nx, Mx)  Path: endometroid adenocarcinoma involving cervix, uterus, and fallopian tubes (Pt got  TAH & BSO 02/2018). No sign of recurrence as of onc f/u 05/2019.   Endometrial hyperplasia 07/02/2015   Treated with D&C and then Mirena IUD x 5 years.  IUD removed 3/17.     GERD (gastroesophageal reflux disease)    Hemorrhoids    Herpes zoster 10/2015   L side belt-line   History of adenomatous polyp of colon 02/25/2013   5 mm cecal polyp removed by Dr. Alfrieda 5 yrs   History of blood transfusion    History of cellulitis 08/2010   Left (Since replacemnt of Left knee   History of radiation therapy 05/21/2018   [facility] brachytherapy 04/24/2018-05/21/2018  Dr Lynwood Nasuti   Hyperkalemia 03/2018   started after pt started getting chemo-->had to stop enalapril .   Hyperlipidemia    Not on statin b/c lipids stayed down when sugars came down per pt.  She says Dr. Tommas knows she is not on statin anymore.   Hypertension    Hypothyroidism    IDA (iron  deficiency anemia)    08/2023 EGD and colon nl other than mild chronic inactive gastritis (h pylori NEG) and one small adenoma and   Moderate persistent asthma    Nephrolithiasis 07/2005   Obesity    OSA on CPAP    Osteoarthritis    knees, ankle, + ? scapholunate ligament disruption (x-ray 06/2017).  Progressive arthritis L wrist and hand 09/2022 radiographs   Pancytopenia due to antineoplastic chemotherapy    progressive as of 06/2018. Stable 08/2018.   Recurrent UTI  started cipro  250 qd 09/2018   Sciatica of left side    Severe persistent asthma (HCC)    cough-variant--saw Allergist 05/25/16 and was switched from max dose advair to symbicort .   Systolic murmur    ECHO fine 10/2016-->murmur likely flow murmur assoc with HTN.   Zoon's vulvitis    Bx-proven (GYN) -lichen sclerosis.  Clobetasol  0.05% ointment per GYN    Past Surgical History:  Procedure Laterality Date   ANKLE FRACTURE SURGERY  1984   Pin & repair   ARTHROSCOPIC REPAIR ACL  2000   Due to ACL tear   ARTHROSCOPIC REPAIR ACL  08/2006   Blateral knee rerlacements x4 2  times each knee     BREAST BIOPSY Right 2014   Benign   CARDIAC CATHETERIZATION  08/2005   clear vessel mild mitral    CARPAL TUNNEL RELEASE Right [id number]   1989 Left   CESAREAN SECTION  1985   CHOLECYSTECTOMY OPEN  1988   COLONOSCOPY N/A 02/25/2013   Tubular adenoma x 1: Recall 5 yrs. Procedure: COLONOSCOPY;  Surgeon: Renaye Sous, MD;  Location: [facility];  Service: Endoscopy;  Laterality: N/A;   COLONOSCOPY     08/22/2023 (IDA) [facility]--> 1 polyp, +diverticulosis.   COMBINED HYSTEROSCOPY DIAGNOSTIC / D&C  05/2010   Bx neg   DEXA  08/2007; 05/12/20   Bone density normal 2009 and 2022.  Plan rpt 2024.   DILATION AND CURETTAGE OF UTERUS  03/2010   ESOPHAGOGASTRODUODENOSCOPY     08/22/2023 [facility] (IDA): antral erythema, nothing to explain bleeding   IR IMAGING GUIDED PORT INSERTION  03/16/2018   IR REMOVAL TUN ACCESS W/ PORT W/O FL MOD SED  09/20/2018   LYMPH NODE BIOPSY N/A 02/19/2018   Procedure: Sentinel LYMPH NODE BIOPSY;  Surgeon: Eloy Herring, MD;  Location[facility];  Service: Gynecology;  Laterality: N/A;   REPLACEMENT TOTAL KNEE Left 08/2010   X 2 on each   ROBOTIC ASSISTED TOTAL HYSTERECTOMY WITH BILATERAL SALPINGO OOPHERECTOMY N/A 02/19/2018   For endometrial cancer.  Procedure: XI ROBOTIC ASSISTED TOTAL HYSTERECTOMY WITH BILATERAL SALPINGO OOPHORECTOMY;  Surgeon: Eloy Herring, MD;  Location: [facility];  Service: Gynecology;  Laterality: N/A;   TRANSTHORACIC ECHOCARDIOGRAM  10/11/2016   2018 EF 55-60%, grd I DD.  2025 normal   TUBAL LIGATION  1985   C-Section    Outpatient Medications Prior to Visit  Medication Sig Dispense Refill   albuterol  (PROVENTIL ) (2.5 MG/3ML) 0.083% nebulizer solution Take 3 mLs (2.5 mg total) by nebulization every 4 (four) hours as needed for wheezing or shortness of breath (Dx: J45.50). 75 mL 1   albuterol  (VENTOLIN  HFA) 108 (90 Base) MCG/ACT inhaler INHALE 2 PUFFS INTO LUNGS EVERY 6 HOURS AS NEEDED  FOR WHEEZING/SHORTNESS OF BREATH 6.7 each 1   amitriptyline  (ELAVIL ) 25 MG tablet TAKE 2 TABLETS BY MOUTH AT BEDTIME. 60 tablet 1   clobetasol  ointment (TEMOVATE ) 0.05 % APPLY 1 APPLICATION TOPICALLY AS NEEDED. APPLY TO THE SKIN OF THE VULVA FOR ITCHING 30 g 2   diclofenac  Sodium (VOLTAREN ) 1 % GEL Apply 2 g topically 4 (four) times daily. 100 g 3   ferrous sulfate 325 (65 FE) MG EC tablet Take 325 mg by mouth at bedtime.     fluticasone  (CUTIVATE ) 0.05 % cream APPLY TO AFFECTED AREA OF RIGHT LOWER LEG TWICE PER DAY. 60 g 1   furosemide  (LASIX ) 40 MG tablet Take 1 tablet (40 mg total) by mouth daily.  30 tablet 1   Insulin  Aspart FlexPen (NOVOLOG ) 100 UNIT/ML NJECT 10-20 UNITS INTO THE SKIN 3 (THREE) 15 MINUTES BEFORE MEALS 45 mL 3   insulin  NPH Human (NOVOLIN  N) 100 UNIT/ML injection INJECT 45-50 UNITS UNDER SKIN DAILY AS ADVISED 150 mL 1   Insulin  Pen Needle 32G X 4 MM MISC Use 3x a day 300 each 3   Insulin  Syringes, Disposable, U-100 0.3 ML MISC Use to inject insulin --6 injections per day 540 each 3   levocetirizine (XYZAL) 5 MG tablet Take 5 mg by mouth at bedtime.  5   miconazole (MICOTIN) 2 % powder Apply 1 application topically 2 (two) times daily as needed for itching.     Phenazopyridine HCl (AZO URINARY PAIN PO) Take 2 tablets by mouth 3 (three) times daily as needed (urinary pain).     Probiotic Product (PROBIOTIC PO) Take 1 capsule by mouth daily.     felodipine  (PLENDIL ) 10 MG 24 hr tablet TAKE 1 TABLET BY MOUTH EVERY DAY 30 tablet 0   HYDROcodone -acetaminophen  (NORCO) 10-325 MG tablet 1/2 - 1 tab po bid prn pain 45 tablet 0   levothyroxine  (SYNTHROID ) 125 MCG tablet Take 1 tablet (125 mcg total) by mouth daily before breakfast. 30 tablet 0   pravastatin  (PRAVACHOL ) 40 MG tablet TAKE 1 TABLET BY MOUTH EVERY DAY 90 tablet 1   No facility-administered medications prior to visit.    Allergies  Allergen Reactions   Adhesive [Tape] Other (See Comments)    Takes patient's skin off.    Banana Diarrhea and Nausea Only   Egg Protein-Containing Drug Products Diarrhea and Nausea Only   Latex Other (See Comments)    sensativity   Linzess [Linaclotide] Diarrhea   Ozempic  (0.25 Or 0.5 Mg-Dose) [Semaglutide (0.25 Or 0.5mg -Dos)] Diarrhea   Penicillins Rash and Other (See Comments)    Has had cephalosporins without incident   Pine Swelling and Rash   Rose Swelling and Rash    Review of Systems As per HPI  PE:    01/26/2024   10:08 AM 01/26/2024    9:56 AM 01/11/2024   12:34 PM  Vitals with BMI  Height  5' 1   Weight  223 lbs 10 oz   BMI  42.27   Systolic 138 173 872  Diastolic 78 73 47  Pulse  87 85     Physical Exam  Gen: Alert, well appearing.  Patient is oriented to person, place, time, and situation. AFFECT: pleasant, lucid thought and speech. CV: RRR, 1/6 systolic murmur, no diast murm, no r/g.   LUNGS: CTA bilat, nonlabored resps, good aeration in all lung fields. Extremities: 1+ right lower leg pitting edema.  No pitting on the left.   LABS:  Last CBC Lab Results  Component Value Date   WBC 9.7 01/01/2024   HGB 8.7 (L) 01/01/2024   HCT 28.3 (L) 01/01/2024   MCV 94.0 01/01/2024   MCH 28.9 01/01/2024   RDW 15.1 01/01/2024   PLT 302 01/01/2024   Lab Results  Component Value Date   IRON  43 01/01/2024   TIBC 245 (L) 01/01/2024   FERRITIN 910 (H) 01/01/2024   Last metabolic panel Lab Results  Component Value Date   GLUCOSE 215 (H) 01/01/2024   NA 141 01/01/2024   K 4.3 01/01/2024   CL 103 01/01/2024   CO2 28 01/01/2024   BUN 36 (H) 01/01/2024   CREATININE 1.36 (H) 01/01/2024   GFRNONAA 41 (L) 01/01/2024   CALCIUM 9.1 01/01/2024  PROT 7.3 01/01/2024   ALBUMIN 3.6 01/01/2024   BILITOT <0.2 01/01/2024   ALKPHOS 80 01/01/2024   AST 14 (L) 01/01/2024   ALT 8 01/01/2024   ANIONGAP 10 01/01/2024   Last lipids Lab Results  Component Value Date   CHOL 110 04/28/2023   HDL 28.70 (L) 04/28/2023   LDLCALC 45 04/28/2023   LDLDIRECT 78.0  10/05/2018   TRIG 185.0 (H) 04/28/2023   CHOLHDL 4 04/28/2023   Last hemoglobin A1c Lab Results  Component Value Date   HGBA1C 5.8 (A) 10/18/2023   Last thyroid  functions Lab Results  Component Value Date   TSH 2.773 11/21/2023   Last vitamin B12 and Folate Lab Results  Component Value Date   VITAMINB12 319 11/20/2023   FOLATE 11.5 11/20/2023   IMPRESSION AND PLAN:  #1 chronic pain syndrome. Osteoarthritis multiple sites plus chemo-induced neuropathy. Stable. Continue Vicodin 10/325, 1-2 every twice daily as needed, #45 today.  #2 recurrent UTI. Asymptomatic currently. 1 complicating factor is the fact that she had bilateral hydronephrosis and hydroureter with urothelial enhancement on a CT of the abdomen pelvis with contrast that was done 01/10/2024 to follow-up a history of cervical cancer. No obstructing lesion was identified.  She did have some upper abdominal retroperitoneal adenopathy favored related to chronic renal findings. Nephrology culture and told her they would refer her to a urologist.  She will follow-up with the nephrologist office because she has not heard anything back about this referral yet.  #3 chronic renal insufficiency stage III. This has been stable on labs back in August and again on 01/01/2024. She has plans for follow-up with the hematologist/oncologist regarding labs and potential ongoing need for EPO injections.  #4 anemia of chronic renal insufficiency. EPO injection x 1 so far and it has helped some. Follow-up is set as noted in #3 above. No labs today.  An After Visit Summary was printed and given to the patient.  FOLLOW UP: Return in 3 months (on 04/27/2024) for routine chronic illness f/u.  Signed:  Gerlene Hockey, MD           01/26/2024

## 2024-01-30 ENCOUNTER — Inpatient Hospital Stay: Admitting: Medical Oncology

## 2024-01-30 ENCOUNTER — Other Ambulatory Visit (HOSPITAL_BASED_OUTPATIENT_CLINIC_OR_DEPARTMENT_OTHER): Payer: Self-pay

## 2024-01-30 ENCOUNTER — Inpatient Hospital Stay

## 2024-01-30 ENCOUNTER — Encounter: Payer: Self-pay | Admitting: Medical Oncology

## 2024-01-30 ENCOUNTER — Encounter: Payer: Self-pay | Admitting: Family

## 2024-01-30 VITALS — BP 141/47 | HR 82 | Temp 98.2°F | Resp 18 | Wt 226.8 lb

## 2024-01-30 DIAGNOSIS — Z9221 Personal history of antineoplastic chemotherapy: Secondary | ICD-10-CM | POA: Diagnosis not present

## 2024-01-30 DIAGNOSIS — N1831 Chronic kidney disease, stage 3a: Secondary | ICD-10-CM

## 2024-01-30 DIAGNOSIS — Z90710 Acquired absence of both cervix and uterus: Secondary | ICD-10-CM | POA: Diagnosis not present

## 2024-01-30 DIAGNOSIS — D631 Anemia in chronic kidney disease: Secondary | ICD-10-CM | POA: Diagnosis not present

## 2024-01-30 DIAGNOSIS — D500 Iron deficiency anemia secondary to blood loss (chronic): Secondary | ICD-10-CM | POA: Diagnosis not present

## 2024-01-30 DIAGNOSIS — Z8542 Personal history of malignant neoplasm of other parts of uterus: Secondary | ICD-10-CM | POA: Diagnosis not present

## 2024-01-30 DIAGNOSIS — N1832 Chronic kidney disease, stage 3b: Secondary | ICD-10-CM | POA: Diagnosis not present

## 2024-01-30 DIAGNOSIS — Z23 Encounter for immunization: Secondary | ICD-10-CM | POA: Diagnosis not present

## 2024-01-30 DIAGNOSIS — D649 Anemia, unspecified: Secondary | ICD-10-CM

## 2024-01-30 LAB — CMP (CANCER CENTER ONLY)
ALT: 10 U/L (ref 0–44)
AST: 16 U/L (ref 15–41)
Albumin: 4 g/dL (ref 3.5–5.0)
Alkaline Phosphatase: 91 U/L (ref 38–126)
Anion gap: 12 (ref 5–15)
BUN: 35 mg/dL — ABNORMAL HIGH (ref 8–23)
CO2: 27 mmol/L (ref 22–32)
Calcium: 9.5 mg/dL (ref 8.9–10.3)
Chloride: 99 mmol/L (ref 98–111)
Creatinine: 1.2 mg/dL — ABNORMAL HIGH (ref 0.44–1.00)
GFR, Estimated: 48 mL/min — ABNORMAL LOW (ref >60)
Glucose, Bld: 176 mg/dL — ABNORMAL HIGH (ref 70–99)
Potassium: 4.6 mmol/L (ref 3.5–5.1)
Sodium: 138 mmol/L (ref 135–145)
Total Bilirubin: 0.2 mg/dL (ref 0.0–1.2)
Total Protein: 8 g/dL (ref 6.5–8.1)

## 2024-01-30 LAB — CBC WITH DIFFERENTIAL (CANCER CENTER ONLY)
Abs Immature Granulocytes: 0.04 K/uL (ref 0.00–0.07)
Basophils Absolute: 0 K/uL (ref 0.0–0.1)
Basophils Relative: 1 %
Eosinophils Absolute: 0.5 K/uL (ref 0.0–0.5)
Eosinophils Relative: 7 %
HCT: 35.6 % — ABNORMAL LOW (ref 36.0–46.0)
Hemoglobin: 11.3 g/dL — ABNORMAL LOW (ref 12.0–15.0)
Immature Granulocytes: 1 %
Lymphocytes Relative: 20 %
Lymphs Abs: 1.2 K/uL (ref 0.7–4.0)
MCH: 30.2 pg (ref 26.0–34.0)
MCHC: 31.7 g/dL (ref 30.0–36.0)
MCV: 95.2 fL (ref 80.0–100.0)
Monocytes Absolute: 0.6 K/uL (ref 0.1–1.0)
Monocytes Relative: 10 %
Neutro Abs: 3.8 K/uL (ref 1.7–7.7)
Neutrophils Relative %: 61 %
Platelet Count: 243 K/uL (ref 150–400)
RBC: 3.74 MIL/uL — ABNORMAL LOW (ref 3.87–5.11)
RDW: 15.6 % — ABNORMAL HIGH (ref 11.5–15.5)
WBC Count: 6.1 K/uL (ref 4.0–10.5)
nRBC: 0 % (ref 0.0–0.2)

## 2024-01-30 MED ORDER — FLUBLOK 0.5 ML IM SOSY
0.5000 mL | PREFILLED_SYRINGE | Freq: Once | INTRAMUSCULAR | 0 refills | Status: AC
  Filled 2024-01-30: qty 0.5, 1d supply, fill #0

## 2024-01-30 NOTE — Progress Notes (Signed)
 Hematology and Oncology Follow Up Visit  [name] [id number] 1951-03-26 73 y.o. 01/30/2024  Past Medical History:  Diagnosis Date   Allergic rhinitis    Allergy testing: results pending as of 05/28/16 ([name])   Anemia 10/2020   anemia of chronic dz (? + mild IDA?--iron  started   Chemotherapy-induced neuropathy 2019   Chronic renal insufficiency, stage 3 (moderate)    Cough variant asthma    DDD (degenerative disc disease), lumbar    Diabetes mellitus with complication (HCC)    Mild nonprolif DR R eye 12/2017-->referred to retinal specialist DM MANAGED BY [name] AS OF 2021   Endometrial cancer (HCC) 02/2018   REMISSION AS OF 08/2018.  Stage III (T3, Nx, Mx)  Path: endometroid adenocarcinoma involving cervix, uterus, and fallopian tubes (Pt got TAH & BSO 02/2018). No sign of recurrence as of onc f/u 05/2019.   Endometrial hyperplasia 07/02/2015   Treated with D&C and then Mirena IUD x 5 years.  IUD removed 3/17.     GERD (gastroesophageal reflux disease)    Hemorrhoids    Herpes zoster 10/2015   L side belt-line   History of adenomatous polyp of colon 02/25/2013   5 mm cecal polyp removed by [name] 5 yrs   History of blood transfusion    History of cellulitis 08/2010   Left (Since replacemnt of Left knee   History of radiation therapy 05/21/2018   [facility] brachytherapy 04/24/2018-05/21/2018  Dr Lynwood Peters   Hyperkalemia 03/2018   started after pt started getting chemo-->had to stop enalapril .   Hyperlipidemia    Not on statin b/c lipids stayed down when sugars came down per pt.  She says [name] knows she is not on statin anymore.   Hypertension    Hypothyroidism    IDA (iron  deficiency anemia)    08/2023 EGD and colon nl other than mild chronic inactive gastritis (h pylori NEG) and one small adenoma and   Moderate persistent asthma    Nephrolithiasis 07/2005   Obesity    OSA on CPAP    Osteoarthritis    knees, ankle, + ? scapholunate ligament  disruption (x-ray 06/2017).  Progressive arthritis L wrist and hand 09/2022 radiographs   Pancytopenia due to antineoplastic chemotherapy    progressive as of 06/2018. Stable 08/2018.   Recurrent UTI    started cipro  250 qd 09/2018   Sciatica of left side    Severe persistent asthma (HCC)    cough-variant--saw Allergist 05/25/16 and was switched from max dose advair to symbicort .   Systolic murmur    ECHO fine 10/2016-->murmur likely flow murmur assoc with HTN.   Zoon's vulvitis    Bx-proven (GYN) -lichen sclerosis.  Clobetasol  0.05% ointment per GYN    Principle Diagnosis:  Iron  Deficiency Anemia due to chronic GI bleeding Erythropoietin  Deficiency Anemia  History of stage IIIa endometrial cancer treated with total hysterectomy, chemo and radiation. She was previously seen by [name].   Current Therapy:   Aranesp 300 mcg Q 3 weeks for Hgb <11 IV Iron  Venofer  300 mg -last given on 11/22/2023 Previous Work Up: EGD and colonoscopy in May. EGD  EGD: Erythema with tiny erosions    Interim History:  [name] is back for follow-up for her anemia:  She is here with her husband.   They report that she has done well since her last visit. She has noticed improvement in stamina and energy since starting her Aranesp. She reports no negative side  effects.   There has been no bleeding to her knowledge: denies epistaxis, gingivitis, hemoptysis, hematemesis, hematuria, melena, excessive bruising, blood donation.     She has had iron  deficiency in the past treated with IV iron  as well as requiring blood transfusion support during her hospitalization in August of this year.    Wt Readings from Last 3 Encounters:  01/30/24 226 lb 12.8 oz (102.9 kg)  01/26/24 223 lb 9.6 oz (101.4 kg)  01/03/24 223 lb (101.2 kg)     Medications:   Current Outpatient Medications:    albuterol  (PROVENTIL ) (2.5 MG/3ML) 0.083% nebulizer solution, Take 3 mLs (2.5 mg total) by nebulization every 4 (four) hours as  needed for wheezing or shortness of breath (Dx: J45.50)., Disp: 75 mL, Rfl: 1   albuterol  (VENTOLIN  HFA) 108 (90 Base) MCG/ACT inhaler, INHALE 2 PUFFS INTO LUNGS EVERY 6 HOURS AS NEEDED FOR WHEEZING/SHORTNESS OF BREATH, Disp: 6.7 each, Rfl: 1   amitriptyline  (ELAVIL ) 25 MG tablet, TAKE 2 TABLETS BY MOUTH AT BEDTIME., Disp: 60 tablet, Rfl: 1   clobetasol  ointment (TEMOVATE ) 0.05 %, APPLY 1 APPLICATION TOPICALLY AS NEEDED. APPLY TO THE SKIN OF THE VULVA FOR ITCHING, Disp: 30 g, Rfl: 2   diclofenac  Sodium (VOLTAREN ) 1 % GEL, Apply 2 g topically 4 (four) times daily., Disp: 100 g, Rfl: 3   felodipine  (PLENDIL ) 10 MG 24 hr tablet, Take 1 tablet (10 mg total) by mouth daily., Disp: 90 tablet, Rfl: 3   ferrous sulfate 325 (65 FE) MG EC tablet, Take 325 mg by mouth at bedtime., Disp: , Rfl:    fluticasone  (CUTIVATE ) 0.05 % cream, APPLY TO AFFECTED AREA OF RIGHT LOWER LEG TWICE PER DAY., Disp: 60 g, Rfl: 1   furosemide  (LASIX ) 40 MG tablet, Take 1 tablet (40 mg total) by mouth daily., Disp: 30 tablet, Rfl: 1   HYDROcodone -acetaminophen  (NORCO) 10-325 MG tablet, 1/2 - 1 tab po bid prn pain, Disp: 45 tablet, Rfl: 0   Insulin  Aspart FlexPen (NOVOLOG ) 100 UNIT/ML, NJECT 10-20 UNITS INTO THE SKIN 3 (THREE) 15 MINUTES BEFORE MEALS, Disp: 45 mL, Rfl: 3   insulin  NPH Human (NOVOLIN  N) 100 UNIT/ML injection, INJECT 45-50 UNITS UNDER SKIN DAILY AS ADVISED, Disp: 150 mL, Rfl: 1   Insulin  Pen Needle 32G X 4 MM MISC, Use 3x a day, Disp: 300 each, Rfl: 3   Insulin  Syringes, Disposable, U-100 0.3 ML MISC, Use to inject insulin --6 injections per day, Disp: 540 each, Rfl: 3   levocetirizine (XYZAL) 5 MG tablet, Take 5 mg by mouth at bedtime., Disp: , Rfl: 5   levothyroxine  (SYNTHROID ) 125 MCG tablet, Take 1 tablet (125 mcg total) by mouth daily before breakfast., Disp: 90 tablet, Rfl: 3   miconazole (MICOTIN) 2 % powder, Apply 1 application topically 2 (two) times daily as needed for itching., Disp: , Rfl:    Phenazopyridine  HCl (AZO URINARY PAIN PO), Take 2 tablets by mouth 3 (three) times daily as needed (urinary pain)., Disp: , Rfl:    pravastatin  (PRAVACHOL ) 40 MG tablet, Take 1 tablet (40 mg total) by mouth daily., Disp: 90 tablet, Rfl: 1   Probiotic Product (PROBIOTIC PO), Take 1 capsule by mouth daily., Disp: , Rfl:   Allergies:  Allergies  Allergen Reactions   Adhesive [Tape] Other (See Comments)    Takes patient's skin off.   Banana Diarrhea and Nausea Only   Egg Protein-Containing Drug Products Diarrhea and Nausea Only   Latex Other (See Comments)    sensativity  Linzess [Linaclotide] Diarrhea   Ozempic  (0.25 Or 0.5 Mg-Dose) [Semaglutide (0.25 Or 0.5mg -Dos)] Diarrhea   Penicillins Rash and Other (See Comments)    Has had cephalosporins without incident   Pine Swelling and Rash   Rose Swelling and Rash    Past Medical History, Surgical history, Social history, and Family History were reviewed and updated.  Review of Systems: Review of Systems  Constitutional: Negative.   HENT:  Negative.    Eyes: Negative.   Respiratory: Negative.    Cardiovascular: Negative.   Gastrointestinal: Negative.   Endocrine: Negative.   Genitourinary: Negative.    Musculoskeletal:  Positive for gait problem (chronic) and myalgias (chronic).  Neurological:  Positive for gait problem (chronic).  Hematological: Negative.   Psychiatric/Behavioral: Negative.       Physical Exam:  weight is 226 lb 12.8 oz (102.9 kg). Her oral temperature is 98.2 F (36.8 C). Her blood pressure is 141/47 (abnormal) and her pulse is 82. Her respiration is 18 and oxygen  saturation is 97%.   Physical Exam General: NAD. Using electric scooter  Cardiovascular: regular rate and rhythm Pulmonary: clear ant fields Abdomen: soft, nontender, + bowel sounds GU: no suprapubic tenderness Extremities: no edema, no joint deformities Skin: no rashes Neurological: Weakness but otherwise nonfocal   Lab Results  Component Value Date    WBC 6.1 01/30/2024   HGB 11.3 (L) 01/30/2024   HCT 35.6 (L) 01/30/2024   MCV 95.2 01/30/2024   PLT 243 01/30/2024     Chemistry      Component Value Date/Time   NA 138 01/30/2024 1327   K 4.6 01/30/2024 1327   CL 99 01/30/2024 1327   CO2 27 01/30/2024 1327   BUN 35 (H) 01/30/2024 1327   CREATININE 1.20 (H) 01/30/2024 1327   CREATININE 1.63 (H) 11/17/2023 1442      Component Value Date/Time   CALCIUM 9.5 01/30/2024 1327   ALKPHOS 91 01/30/2024 1327   AST 16 01/30/2024 1327   ALT 10 01/30/2024 1327   BILITOT 0.2 01/30/2024 1327     Encounter Diagnoses  Name Primary?   Erythropoietin  deficiency anemia Yes   Chronic kidney disease, stage 3a (HCC)    Symptomatic anemia    Assessment and Plan- [name] who was referred to us  for anemia. She has both iron  and erythropoietin  deficiency related anemia. She gets IV iron  PRN along with Aranesp for Hgb < 11.   Today her Hgb had improved from 8.7 to 11.3 with the Aranesp. Symptoms have improved as well.  No Arensp today   Disposition: RTC 3 weeks labs, injection RTC 2 months labs, APP, injection    Lauraine Dais PA-C 10/21/20252:33 PM

## 2024-01-31 ENCOUNTER — Inpatient Hospital Stay

## 2024-01-31 ENCOUNTER — Ambulatory Visit

## 2024-01-31 ENCOUNTER — Ambulatory Visit: Admitting: Family

## 2024-01-31 ENCOUNTER — Inpatient Hospital Stay: Admitting: Family

## 2024-02-04 ENCOUNTER — Other Ambulatory Visit: Payer: Self-pay | Admitting: Family Medicine

## 2024-02-07 LAB — OPHTHALMOLOGY REPORT-SCANNED

## 2024-02-20 ENCOUNTER — Inpatient Hospital Stay: Attending: Obstetrics & Gynecology

## 2024-02-20 ENCOUNTER — Inpatient Hospital Stay

## 2024-02-20 DIAGNOSIS — D649 Anemia, unspecified: Secondary | ICD-10-CM

## 2024-02-20 DIAGNOSIS — N1831 Chronic kidney disease, stage 3a: Secondary | ICD-10-CM | POA: Insufficient documentation

## 2024-02-20 DIAGNOSIS — D509 Iron deficiency anemia, unspecified: Secondary | ICD-10-CM | POA: Insufficient documentation

## 2024-02-20 DIAGNOSIS — D631 Anemia in chronic kidney disease: Secondary | ICD-10-CM | POA: Diagnosis not present

## 2024-02-20 LAB — CBC
HCT: 38.2 % (ref 36.0–46.0)
Hemoglobin: 12.4 g/dL (ref 12.0–15.0)
MCH: 30.7 pg (ref 26.0–34.0)
MCHC: 32.5 g/dL (ref 30.0–36.0)
MCV: 94.6 fL (ref 80.0–100.0)
Platelets: 259 K/uL (ref 150–400)
RBC: 4.04 MIL/uL (ref 3.87–5.11)
RDW: 14.6 % (ref 11.5–15.5)
WBC: 7.7 K/uL (ref 4.0–10.5)
nRBC: 0 % (ref 0.0–0.2)

## 2024-02-20 NOTE — Progress Notes (Signed)
 completed

## 2024-02-28 ENCOUNTER — Other Ambulatory Visit (HOSPITAL_BASED_OUTPATIENT_CLINIC_OR_DEPARTMENT_OTHER): Payer: Self-pay

## 2024-03-01 ENCOUNTER — Encounter: Payer: Self-pay | Admitting: Internal Medicine

## 2024-03-01 ENCOUNTER — Other Ambulatory Visit

## 2024-03-01 ENCOUNTER — Ambulatory Visit (INDEPENDENT_AMBULATORY_CARE_PROVIDER_SITE_OTHER): Admitting: Internal Medicine

## 2024-03-01 ENCOUNTER — Ambulatory Visit: Admitting: Internal Medicine

## 2024-03-01 VITALS — BP 144/64 | Ht 61.0 in | Wt 226.0 lb

## 2024-03-01 DIAGNOSIS — Z7984 Long term (current) use of oral hypoglycemic drugs: Secondary | ICD-10-CM | POA: Diagnosis not present

## 2024-03-01 DIAGNOSIS — E1165 Type 2 diabetes mellitus with hyperglycemia: Secondary | ICD-10-CM | POA: Diagnosis not present

## 2024-03-01 DIAGNOSIS — E785 Hyperlipidemia, unspecified: Secondary | ICD-10-CM | POA: Diagnosis not present

## 2024-03-01 DIAGNOSIS — E1142 Type 2 diabetes mellitus with diabetic polyneuropathy: Secondary | ICD-10-CM | POA: Diagnosis not present

## 2024-03-01 LAB — POCT GLYCOSYLATED HEMOGLOBIN (HGB A1C): Hemoglobin A1C: 7.4 % — AB (ref 4.0–5.6)

## 2024-03-01 MED ORDER — INSULIN PEN NEEDLE 32G X 4 MM MISC: 3 refills | Status: AC

## 2024-03-01 MED ORDER — METFORMIN HCL 500 MG PO TABS: 1000.0000 mg | ORAL_TABLET | Freq: Every day | ORAL | 3 refills | Status: AC

## 2024-03-01 MED ORDER — INSULIN ASPART FLEXPEN 100 UNIT/ML ~~LOC~~ SOPN: PEN_INJECTOR | SUBCUTANEOUS | 3 refills | Status: AC

## 2024-03-01 MED ORDER — INSULIN NPH (HUMAN) (ISOPHANE) 100 UNIT/ML ~~LOC~~ SUSP: SUBCUTANEOUS | 3 refills | Status: AC

## 2024-03-01 NOTE — Progress Notes (Signed)
 ID: [name], female   DOB: 1951/01/18, 73 y.o.   MRN: [id number]   HPI: [name] is a 73 y.o.-year-old female, initially referred by her PCP, Dr. Helon, returning for follow-up for DM2, dx in 1992, insulin -dependent since 2004-2005, uncontrolled, with complications (DR).  Last visit 4 months ago.   On [employer].  Interim history: No increased urination, blurry vision, nausea, chest pain. She was taken off Metformin  since last OV after an admission for symptomatic CHF, anemia >> was on Furosemide , iron , RBCs, EPO. She feels much better now.  She has joint aches and neuropathy in feet.  Reviewed HbA1c levels: Lab Results  Component Value Date   HGBA1C 5.8 (A) 10/18/2023   HGBA1C 6.4 (A) 01/18/2023   HGBA1C 6.6 (A) 07/18/2022   HGBA1C 6.1 (A) 11/29/2021   HGBA1C 6.4 (A) 07/26/2021   HGBA1C 6.6 (A) 03/08/2021   HGBA1C 6.3 (A) 09/01/2020   HGBA1C 9.1 (A) 05/15/2020   HGBA1C 7.7 (A) 12/26/2019   HGBA1C 6.6 (A) 05/07/2019   She is on: - Metformin  500 mg in am and 1000 mg with dinner >> off Insulin  Before breakfast Before lunch Before dinner Bedtime  NovoLog  15-20 10 >> 15 15-20 -  NPH 15 >> 20 10 >> 15 - 20 >> 25   She tried Ozempic  0.5 mg weekly >> significant diarrhea, abd. Pain, food intolerance >> started 04/2019 -stopped 07/2019 mainly because of lack of coverage.  She is checking more than 4 times a day with her CGM.  She has to use skin tac to be able to tolerate the adhesive:  Prev.:  Previously:    Lowest sugar was  53 >> 62 >> 50s >> 52; she has hypoglycemia awareness in the 70s.  Highest sugar was  300s >> 310 >> 318 >> 310.  Glucometer: [facility]  Pt's meals are: - Breakfast: oatmeal - instant, peacans, butter >> sausage, bacon, apple sauce, velveeta crackers - Lunch: tortilla + PB and J or turkey and cheese >> yoghurt, apple sauce, half a sandwich - Dinner: meat (hamburger, chicken, bratwurst), spaghetti, green beans, mixed veggies - Snacks:   -No  CKD, last BUN/creatinine:  Lab Results  Component Value Date   BUN 35 (H) 01/30/2024   BUN 36 (H) 01/01/2024   CREATININE 1.20 (H) 01/30/2024   CREATININE 1.36 (H) 01/01/2024   Lab Results  Component Value Date   MICRALBCREAT 882 (H) 10/18/2023  After the above results returned, we increased her lisinopril  from 10 to 20 mg daily. Now off - stopped in the hospital.  -+ HL; last set of lipids: Lab Results  Component Value Date   CHOL 110 04/28/2023   HDL 28.70 (L) 04/28/2023   LDLCALC 45 04/28/2023   LDLDIRECT 78.0 10/05/2018   TRIG 185.0 (H) 04/28/2023   CHOLHDL 4 04/28/2023  On pravastatin  20.  - last eye exam was on 02/07/2024: + DR.  She had cataract surgery 02/23/2021.  -+ numbness and tingling in her feet -chemotherapy related.  Last foot exam 10/18/2023.  Pt has FH of DM in daughter, brother, father, P aunts.  She also has a history of endometrial cancer - 2019, also, OSA, chemotherapy-induced peripheral neuropathy, hypothyroidism-on levothyroxine  125 mcg daily.  Latest TSH was normal: Lab Results  Component Value Date   TSH 2.773 11/21/2023   She lost a child at 89 y/o in 37 >> she gained 100 lbs and was dx'ed with DM afterwards.  No h/o pancreatitis or FH of MTC.  Brother died  of Lewy body dementia.  ROS: + see HPI  I reviewed pt's medications, allergies, PMH, social hx, family hx, and changes were documented in the history of present illness. Otherwise, unchanged from my initial visit note.  Past Medical History:  Diagnosis Date   Allergic rhinitis    Allergy testing: results pending as of 05/28/16 (Dr. Fleeta Smock)   Anemia 10/2020   anemia of chronic dz (? + mild IDA?--iron  started   Chemotherapy-induced neuropathy 2019   Chronic renal insufficiency, stage 3 (moderate)    Cough variant asthma    DDD (degenerative disc disease), lumbar    Diabetes mellitus with complication (HCC)    Mild nonprolif DR R eye 12/2017-->referred to retinal specialist DM  MANAGED BY DR. Sarann Tregre AS OF 2021   Endometrial cancer (HCC) 02/2018   REMISSION AS OF 08/2018.  Stage III (T3, Nx, Mx)  Path: endometroid adenocarcinoma involving cervix, uterus, and fallopian tubes (Pt got TAH & BSO 02/2018). No sign of recurrence as of onc f/u 05/2019.   Endometrial hyperplasia 07/02/2015   Treated with D&C and then Mirena IUD x 5 years.  IUD removed 3/17.     GERD (gastroesophageal reflux disease)    Hemorrhoids    Herpes zoster 10/2015   L side belt-line   History of adenomatous polyp of colon 02/25/2013   5 mm cecal polyp removed by Dr. Alfrieda 5 yrs   History of blood transfusion    History of cellulitis 08/2010   Left (Since replacemnt of Left knee   History of radiation therapy 05/21/2018   [facility] brachytherapy 04/24/2018-05/21/2018  Dr Lynwood Nasuti   Hyperkalemia 03/2018   started after pt started getting chemo-->had to stop enalapril .   Hyperlipidemia    Not on statin b/c lipids stayed down when sugars came down per pt.  She says Dr. Tommas knows she is not on statin anymore.   Hypertension    Hypothyroidism    IDA (iron  deficiency anemia)    08/2023 EGD and colon nl other than mild chronic inactive gastritis (h pylori NEG) and one small adenoma and   Moderate persistent asthma    Nephrolithiasis 07/2005   Obesity    OSA on CPAP    Osteoarthritis    knees, ankle, + ? scapholunate ligament disruption (x-ray 06/2017).  Progressive arthritis L wrist and hand 09/2022 radiographs   Pancytopenia due to antineoplastic chemotherapy    progressive as of 06/2018. Stable 08/2018.   Recurrent UTI    started cipro  250 qd 09/2018   Sciatica of left side    Severe persistent asthma (HCC)    cough-variant--saw Allergist 05/25/16 and was switched from max dose advair to symbicort .   Systolic murmur    ECHO fine 10/2016-->murmur likely flow murmur assoc with HTN.   Zoon's vulvitis    Bx-proven (GYN) -lichen sclerosis.  Clobetasol  0.05% ointment per GYN   Past Surgical  History:  Procedure Laterality Date   ANKLE FRACTURE SURGERY  1984   Pin & repair   ARTHROSCOPIC REPAIR ACL  2000   Due to ACL tear   ARTHROSCOPIC REPAIR ACL  08/2006   Blateral knee rerlacements x4 2 times each knee     BREAST BIOPSY Right 2014   Benign   CARDIAC CATHETERIZATION  08/2005   clear vessel mild mitral    CARPAL TUNNEL RELEASE Right 8015;8010   1989 Left   CESAREAN SECTION  1985   CHOLECYSTECTOMY OPEN  1988   COLONOSCOPY N/A 02/25/2013   Tubular  adenoma x 1: Recall 5 yrs. Procedure: COLONOSCOPY;  Surgeon: Renaye Sous, MD;  Location: [facility];  Service: Endoscopy;  Laterality: N/A;   COLONOSCOPY     08/22/2023 (IDA) [facility]--> 1 polyp, +diverticulosis.   COMBINED HYSTEROSCOPY DIAGNOSTIC / D&C  05/2010   Bx neg   DEXA  08/2007; 05/12/20   Bone density normal 2009 and 2022.  Plan rpt 2024.   DILATION AND CURETTAGE OF UTERUS  03/2010   ESOPHAGOGASTRODUODENOSCOPY     08/22/2023 [facility] (IDA): antral erythema, nothing to explain bleeding   IR IMAGING GUIDED PORT INSERTION  03/16/2018   IR REMOVAL TUN ACCESS W/ PORT W/O FL MOD SED  09/20/2018   LYMPH NODE BIOPSY N/A 02/19/2018   Procedure: Sentinel LYMPH NODE BIOPSY;  Surgeon: Eloy Herring, MD;  Location: [facility];  Service: Gynecology;  Laterality: N/A;   REPLACEMENT TOTAL KNEE Left 08/2010   X 2 on each   ROBOTIC ASSISTED TOTAL HYSTERECTOMY WITH BILATERAL SALPINGO OOPHERECTOMY N/A 02/19/2018   For endometrial cancer.  Procedure: XI ROBOTIC ASSISTED TOTAL HYSTERECTOMY WITH BILATERAL SALPINGO OOPHORECTOMY;  Surgeon: Eloy Herring, MD;  Location: [facility] ;  Service: Gynecology;  Laterality: N/A;   TRANSTHORACIC ECHOCARDIOGRAM  10/11/2016   2018 EF 55-60%, grd I DD.  2025 normal   TUBAL LIGATION  1985   C-Section   Social History   Socioeconomic History   Marital status: Married    Spouse name: Darina   Number of children: 2   Years of education: Not on file   Highest  education level: Bachelor's degree (e.g., BA, AB, BS)  Occupational History   Occupation: retired [employer]  Tobacco Use   Smoking status: Never   Smokeless tobacco: Never  Vaping Use   Vaping status: Never Used  Substance and Sexual Activity   Alcohol use: No    Alcohol/week: 0.0 standard drinks of alcohol   Drug use: No   Sexual activity: Never    Partners: Male    Birth control/protection: Post-menopausal  Other Topics Concern   Not on file  Social History Narrative   Not on file   Social Drivers of Health   Financial Resource Strain: Low Risk  (01/26/2024)   Overall Financial Resource Strain (CARDIA)    Difficulty of Paying Living Expenses: Not hard at all  Food Insecurity: No Food Insecurity (01/26/2024)   Hunger Vital Sign    Worried About Running Out of Food in the Last Year: Never true    Ran Out of Food in the Last Year: Never true  Transportation Needs: No Transportation Needs (01/26/2024)   PRAPARE - Administrator, Civil Service (Medical): No    Lack of Transportation (Non-Medical): No  Physical Activity: Unknown (01/26/2024)   Exercise Vital Sign    Days of Exercise per Week: Patient declined    Minutes of Exercise per Session: Not on file  Recent Concern: Physical Activity - Inactive (11/16/2023)   Exercise Vital Sign    Days of Exercise per Week: 0 days    Minutes of Exercise per Session: Not on file  Stress: No Stress Concern Present (01/26/2024)   [employer] of Occupational Health - Occupational Stress Questionnaire    Feeling of Stress: Not at all  Social Connections: Unknown (01/26/2024)   Social Connection and Isolation Panel    Frequency of Communication with Friends and Family: Twice a week    Frequency of Social Gatherings with Friends and Family: Three times a  week    Attends Religious Services: Patient declined    Active Member of Clubs or Organizations: Patient declined    Attends Banker Meetings: Not on file     Marital Status: Married  Intimate Partner Violence: Not At Risk (01/26/2024)   Humiliation, Afraid, Rape, and Kick questionnaire    Fear of Current or Ex-Partner: No    Emotionally Abused: No    Physically Abused: No    Sexually Abused: No   Current Outpatient Medications on File Prior to Visit  Medication Sig Dispense Refill   albuterol  (PROVENTIL ) (2.5 MG/3ML) 0.083% nebulizer solution Take 3 mLs (2.5 mg total) by nebulization every 4 (four) hours as needed for wheezing or shortness of breath (Dx: J45.50). 75 mL 1   albuterol  (VENTOLIN  HFA) 108 (90 Base) MCG/ACT inhaler INHALE 2 PUFFS INTO LUNGS EVERY 6 HOURS AS NEEDED FOR WHEEZING/SHORTNESS OF BREATH 6.7 each 1   amitriptyline  (ELAVIL ) 25 MG tablet TAKE 2 TABLETS BY MOUTH AT BEDTIME. 60 tablet 3   clobetasol  ointment (TEMOVATE ) 0.05 % APPLY 1 APPLICATION TOPICALLY AS NEEDED. APPLY TO THE SKIN OF THE VULVA FOR ITCHING 30 g 2   diclofenac  Sodium (VOLTAREN ) 1 % GEL Apply 2 g topically 4 (four) times daily. 100 g 3   felodipine  (PLENDIL ) 10 MG 24 hr tablet Take 1 tablet (10 mg total) by mouth daily. 90 tablet 3   ferrous sulfate 325 (65 FE) MG EC tablet Take 325 mg by mouth at bedtime.     fluticasone  (CUTIVATE ) 0.05 % cream APPLY TO AFFECTED AREA OF RIGHT LOWER LEG TWICE PER DAY. 60 g 1   furosemide  (LASIX ) 40 MG tablet Take 1 tablet (40 mg total) by mouth daily. 30 tablet 1   HYDROcodone -acetaminophen  (NORCO) 10-325 MG tablet 1/2 - 1 tab po bid prn pain 45 tablet 0   Insulin  Aspart FlexPen (NOVOLOG ) 100 UNIT/ML NJECT 10-20 UNITS INTO THE SKIN 3 (THREE) 15 MINUTES BEFORE MEALS 45 mL 3   insulin  NPH Human (NOVOLIN  N) 100 UNIT/ML injection INJECT 45-50 UNITS UNDER SKIN DAILY AS ADVISED 150 mL 1   Insulin  Pen Needle 32G X 4 MM MISC Use 3x a day 300 each 3   Insulin  Syringes, Disposable, U-100 0.3 ML MISC Use to inject insulin --6 injections per day 540 each 3   levocetirizine (XYZAL) 5 MG tablet Take 5 mg by mouth at bedtime.  5   levothyroxine   (SYNTHROID ) 125 MCG tablet Take 1 tablet (125 mcg total) by mouth daily before breakfast. 90 tablet 3   miconazole (MICOTIN) 2 % powder Apply 1 application topically 2 (two) times daily as needed for itching.     Phenazopyridine HCl (AZO URINARY PAIN PO) Take 2 tablets by mouth 3 (three) times daily as needed (urinary pain).     pravastatin  (PRAVACHOL ) 40 MG tablet Take 1 tablet (40 mg total) by mouth daily. 90 tablet 1   Probiotic Product (PROBIOTIC PO) Take 1 capsule by mouth daily.     No current facility-administered medications on file prior to visit.   Allergies  Allergen Reactions   Adhesive [Tape] Other (See Comments)    Takes patient's skin off.   Banana Diarrhea and Nausea Only   Egg Protein-Containing Drug Products Diarrhea and Nausea Only   Latex Other (See Comments)    sensativity   Linzess [Linaclotide] Diarrhea   Ozempic  (0.25 Or 0.5 Mg-Dose) [Semaglutide (0.25 Or 0.5mg -Dos)] Diarrhea   Penicillins Rash and Other (See Comments)    Has had cephalosporins without  incident   Pine Swelling and Rash   Rose Swelling and Rash   Family History  Problem Relation Age of Onset   Diabetes Father    COPD Father    Stroke Father    Celiac disease Brother    Congenital heart disease Brother    Rheum arthritis Brother    Thyroid  disease Brother    Diabetes Brother    Dementia Brother        Lewy Body   Alzheimer's disease Mother    Arthritis Mother    Other Son        Died age 67 -Tetrology of Fallot - VSD/pulmonary atresia   Breast cancer Other        postmenopausal when diagnosed   PE: BP (!) 144/64   Ht 5' 1 (1.549 m)   Wt 226 lb (102.5 kg)   LMP 08/10/2010 Comment: Hysterectomy 02/02/18  BMI 42.70 kg/m  Wt Readings from Last 20 Encounters:  03/01/24 226 lb (102.5 kg)  01/30/24 226 lb 12.8 oz (102.9 kg)  01/26/24 223 lb 9.6 oz (101.4 kg)  01/03/24 223 lb (101.2 kg)  12/26/23 219 lb 12.8 oz (99.7 kg)  12/08/23 217 lb 9.6 oz (98.7 kg)  12/01/23 219 lb 12.8 oz  (99.7 kg)  11/27/23 222 lb 8 oz (100.9 kg)  11/23/23 224 lb 6.9 oz (101.8 kg)  11/17/23 236 lb (107 kg)  10/18/23 239 lb 9.6 oz (108.7 kg)  09/20/23 239 lb 12.8 oz (108.8 kg)  09/01/23 240 lb 6.4 oz (109 kg)  05/19/23 239 lb 6.4 oz (108.6 kg)  04/28/23 247 lb 9.6 oz (112.3 kg)  01/19/23 249 lb (112.9 kg)  01/18/23 249 lb (112.9 kg)  12/20/22 254 lb 6.4 oz (115.4 kg)  12/02/22 258 lb 3.2 oz (117.1 kg)  09/26/22 257 lb 12.8 oz (116.9 kg)   Constitutional: obese, in NAD, in motorized wheelchair Eyes: EOMI, no exophthalmos ENT: no thyromegaly, no cervical lymphadenopathy Cardiovascular: RRR, +1 SEM, No RG, + bilateral LE pitting edema Respiratory: CTA B Musculoskeletal: no deformities Skin: bilateral periankle/shin erythema Neurological: + tremor with outstretched hands  ASSESSMENT: 1. DM2, insulin -dependent, uncontrolled, with complications - mild NP DR OD  2. HL  3.  Obesity class III  PLAN:  1. Patient with longstanding, well-controlled, type 2 diabetes, insulin -dependent, on metformin  and basal-bolus insulin  regimen.  She was previously on Fiasp  but she had to change to NovoLog  per insurance preference.  She continues on NPH.  At last visit, HbA1c was 5.8%, lower, and sugars were fluctuating within the target range mostly, but with lows throughout the day and night.  Sugars were higher after dinner, but mostly within the target range.  She mentions that some of the higher blood sugars were due to overcorrection of lows.  I did advise her to that whenever she corrected her blood sugar to check with her glucometer, rather than the sensor we also decreased all of her insulin  doses and I advised her to let me know if she had any more lows. CGM interpretation: -At today's visit, we reviewed her CGM downloads: It appears that 60% of values are in target range (goal >70%), while 39% are higher than 180 (goal <25%), and 1% are lower than 70 (goal <4%).  The calculated average blood sugar  is 168.  The projected HbA1c for the next 3 months (GMI) is 7.3%. -Reviewing the CGM trends, sugars are higher than before, increasing after breakfast, and then dropping and increasing again after dinner, remaining  elevated throughout the night but improving in the second half of the night.  Upon questioning, she is off metformin , she was taken off due to decreased kidney function.  However, this has now recovered with the most recent GFR at 48.  We discussed that we could use a low-dose metformin , starting at 500 mg daily and increasing to 1000 mg in the evening to hopefully improve her overnight blood sugars.  I did advise her that if they do not improve, to increase her NPH at night slightly.  Due to the increase in blood sugars midday, I also advised her to increase slightly her morning NPH.  Since she is dropping her blood sugars in the afternoon, I advised her to reduce her midday NPH dose.  We can continue the same doses of NovoLog , which she increased since last visit while off metformin . - I suggested to:  Patient Instructions  Please restart: - Metformin  500 mg with dinner for 3 days, then increase to 1000 mg with dinner   Please use the following regimen: Insulin  Before breakfast Before lunch Before dinner Bedtime  NovoLog  15-20 10-15 15-20 -  NPH 25 10 - 25 (may need 28-30 units if sugars remain high overnight)   Please come back for a follow-up appointment in 3-4 months.  - we checked her HbA1c: 7.4% (higher) - advised to check sugars at different times of the day - 4x a day, rotating check times - advised for yearly eye exams >> she is UTD -She had a very high ACR at last visit, 882.  We increased her lisinopril  from 10 to 20 mg daily >> but taken off in the hospital.  We will recheck this today. - return to clinic in 4-6 months  2. HL - Latest lipid panel was reviewed from 04/2023: LDL at goal, Tg elevated, HDL low: Lab Results  Component Value Date   CHOL 110 04/28/2023   HDL  28.70 (L) 04/28/2023   LDLCALC 45 04/28/2023   LDLDIRECT 78.0 10/05/2018   TRIG 185.0 (H) 04/28/2023   CHOLHDL 4 04/28/2023  - She continues Pravastatin  40 mg daily - no SEs  3.  Obesity class III - Unfortunately, we could not continue Ozempic  as this was not covered by her insurance.  This would have helped with weight loss. - she lost 10 lbs before last OV, prev. Lost 12 - At today's visit, she lost 13 pounds since last visit  Lela Fendt, MD PhD [facility]

## 2024-03-01 NOTE — Patient Instructions (Addendum)
 Please restart: - Metformin  500 mg with dinner for 3 days, then increase to 1000 mg with dinner   Please use the following regimen: Insulin  Before breakfast Before lunch Before dinner Bedtime  NovoLog  15-20 10-15 15-20 -  NPH 25 10 - 25 (may need 28-30 units if sugars remain high overnight)   Please come back for a follow-up appointment in 3-4 months.

## 2024-03-03 ENCOUNTER — Other Ambulatory Visit: Payer: Self-pay | Admitting: Family Medicine

## 2024-03-19 ENCOUNTER — Encounter: Payer: Self-pay | Admitting: Family

## 2024-03-26 ENCOUNTER — Inpatient Hospital Stay: Payer: PRIVATE HEALTH INSURANCE

## 2024-03-26 ENCOUNTER — Encounter: Payer: Self-pay | Admitting: Family

## 2024-03-26 ENCOUNTER — Inpatient Hospital Stay: Payer: PRIVATE HEALTH INSURANCE | Attending: Obstetrics & Gynecology

## 2024-03-26 ENCOUNTER — Other Ambulatory Visit: Payer: Self-pay | Admitting: Medical Oncology

## 2024-03-26 ENCOUNTER — Inpatient Hospital Stay: Payer: PRIVATE HEALTH INSURANCE | Attending: Obstetrics & Gynecology | Admitting: Medical Oncology

## 2024-03-26 VITALS — BP 134/59 | HR 88 | Temp 98.7°F | Resp 18 | Ht 61.0 in | Wt 225.0 lb

## 2024-03-26 DIAGNOSIS — N1831 Chronic kidney disease, stage 3a: Secondary | ICD-10-CM

## 2024-03-26 DIAGNOSIS — D631 Anemia in chronic kidney disease: Secondary | ICD-10-CM

## 2024-03-26 DIAGNOSIS — Z8542 Personal history of malignant neoplasm of other parts of uterus: Secondary | ICD-10-CM | POA: Diagnosis not present

## 2024-03-26 DIAGNOSIS — D649 Anemia, unspecified: Secondary | ICD-10-CM | POA: Diagnosis not present

## 2024-03-26 LAB — CBC WITH DIFFERENTIAL (CANCER CENTER ONLY)
Abs Immature Granulocytes: 0.07 K/uL (ref 0.00–0.07)
Basophils Absolute: 0.1 K/uL (ref 0.0–0.1)
Basophils Relative: 1 %
Eosinophils Absolute: 0.4 K/uL (ref 0.0–0.5)
Eosinophils Relative: 4 %
HCT: 36.1 % (ref 36.0–46.0)
Hemoglobin: 11.8 g/dL — ABNORMAL LOW (ref 12.0–15.0)
Immature Granulocytes: 1 %
Lymphocytes Relative: 19 %
Lymphs Abs: 1.6 K/uL (ref 0.7–4.0)
MCH: 31.2 pg (ref 26.0–34.0)
MCHC: 32.7 g/dL (ref 30.0–36.0)
MCV: 95.5 fL (ref 80.0–100.0)
Monocytes Absolute: 0.8 K/uL (ref 0.1–1.0)
Monocytes Relative: 10 %
Neutro Abs: 5.4 K/uL (ref 1.7–7.7)
Neutrophils Relative %: 65 %
Platelet Count: 244 K/uL (ref 150–400)
RBC: 3.78 MIL/uL — ABNORMAL LOW (ref 3.87–5.11)
RDW: 13.2 % (ref 11.5–15.5)
WBC Count: 8.2 K/uL (ref 4.0–10.5)
nRBC: 0 % (ref 0.0–0.2)

## 2024-03-26 LAB — CMP (CANCER CENTER ONLY)
ALT: 10 U/L (ref 0–44)
AST: 16 U/L (ref 15–41)
Albumin: 4.1 g/dL (ref 3.5–5.0)
Alkaline Phosphatase: 82 U/L (ref 38–126)
Anion gap: 12 (ref 5–15)
BUN: 41 mg/dL — ABNORMAL HIGH (ref 8–23)
CO2: 29 mmol/L (ref 22–32)
Calcium: 9.5 mg/dL (ref 8.9–10.3)
Chloride: 102 mmol/L (ref 98–111)
Creatinine: 1.32 mg/dL — ABNORMAL HIGH (ref 0.44–1.00)
GFR, Estimated: 42 mL/min — ABNORMAL LOW (ref >60)
Glucose, Bld: 92 mg/dL (ref 70–99)
Potassium: 4.2 mmol/L (ref 3.5–5.1)
Sodium: 144 mmol/L (ref 135–145)
Total Bilirubin: 0.2 mg/dL (ref 0.0–1.2)
Total Protein: 7.9 g/dL (ref 6.5–8.1)

## 2024-03-26 LAB — RETIC PANEL
Immature Retic Fract: 10.1 % (ref 2.3–15.9)
RBC.: 3.81 MIL/uL — ABNORMAL LOW (ref 3.87–5.11)
Retic Count, Absolute: 46.9 K/uL (ref 19.0–186.0)
Retic Ct Pct: 1.2 % (ref 0.4–3.1)
Reticulocyte Hemoglobin: 35.8 pg (ref >27.9)

## 2024-03-26 LAB — IRON AND IRON BINDING CAPACITY (CC-WL,HP ONLY)
Iron: 72 ug/dL (ref 28–170)
Saturation Ratios: 23 % (ref 10.4–31.8)
TIBC: 314 ug/dL (ref 250–450)
UIBC: 241 ug/dL

## 2024-03-26 LAB — FERRITIN: Ferritin: 478 ng/mL — ABNORMAL HIGH (ref 11–307)

## 2024-03-26 NOTE — Progress Notes (Signed)
 Hematology and Oncology Follow Up Visit  [name] [id number] Jun 22, 1950 73 y.o. 03/26/2024  Past Medical History:  Diagnosis Date   Allergic rhinitis    Allergy testing: results pending as of 05/28/16 (Dr. Fleeta Smock)   Anemia 10/2020   anemia of chronic dz (? + mild IDA?--iron  started   Chemotherapy-induced neuropathy 2019   Chronic renal insufficiency, stage 3 (moderate)    Cough variant asthma    DDD (degenerative disc disease), lumbar    Diabetes mellitus with complication (HCC)    Mild nonprolif DR R eye 12/2017-->referred to retinal specialist DM MANAGED BY DR. GHERGHE AS OF 2021   Endometrial cancer (HCC) 02/2018   REMISSION AS OF 08/2018.  Stage III (T3, Nx, Mx)  Path: endometroid adenocarcinoma involving cervix, uterus, and fallopian tubes (Pt got TAH & BSO 02/2018). No sign of recurrence as of onc f/u 05/2019.   Endometrial hyperplasia 07/02/2015   Treated with D&C and then Mirena IUD x 5 years.  IUD removed 3/17.     GERD (gastroesophageal reflux disease)    Hemorrhoids    Herpes zoster 10/2015   L side belt-line   History of adenomatous polyp of colon 02/25/2013   5 mm cecal polyp removed by Dr. Alfrieda 5 yrs   History of blood transfusion    History of cellulitis 08/2010   Left (Since replacemnt of Left knee   History of radiation therapy 05/21/2018   [facility] brachytherapy 04/24/2018-05/21/2018  Dr Lynwood Nasuti   Hyperkalemia 03/2018   started after pt started getting chemo-->had to stop enalapril .   Hyperlipidemia    Not on statin b/c lipids stayed down when sugars came down per pt.  She says Dr. Tommas knows she is not on statin anymore.   Hypertension    Hypothyroidism    IDA (iron  deficiency anemia)    08/2023 EGD and colon nl other than mild chronic inactive gastritis (h pylori NEG) and one small adenoma and   Moderate persistent asthma    Nephrolithiasis 07/2005   Obesity    OSA on CPAP    Osteoarthritis    knees, ankle, + ? scapholunate ligament  disruption (x-ray 06/2017).  Progressive arthritis L wrist and hand 09/2022 radiographs   Pancytopenia due to antineoplastic chemotherapy    progressive as of 06/2018. Stable 08/2018.   Recurrent UTI    started cipro  250 qd 09/2018   Sciatica of left side    Severe persistent asthma (HCC)    cough-variant--saw Allergist 05/25/16 and was switched from max dose advair to symbicort .   Systolic murmur    ECHO fine 10/2016-->murmur likely flow murmur assoc with HTN.   Zoon's vulvitis    Bx-proven (GYN) -lichen sclerosis.  Clobetasol  0.05% ointment per GYN    Principle Diagnosis:  Iron  Deficiency Anemia due to chronic GI bleeding Erythropoietin  Deficiency Anemia  History of stage IIIa endometrial cancer treated with total hysterectomy, chemo and radiation. She was previously seen by Dr. Lonn.   Current Therapy:   Aranesp  300 mcg Q 3 weeks for Hgb <11 IV Iron  Venofer  300 mg -last given on 11/22/2023 Previous Work Up: EGD and colonoscopy in May 2025 EGD: Erythema with tiny erosions  Colonoscopy with one small polyp    Interim History:  [name] is back for follow-up for her anemia:  She is doing well.   She has noticed improvement in stamina and energy since starting her Aranesp . She reports no negative side effects. She has noticed that this past week  her improved fatigue has fallen a bit but is still better than prior to starting Aranesp .   There has been no bleeding to her knowledge: denies epistaxis, gingivitis, hemoptysis, hematemesis, hematuria, melena, excessive bruising, blood donation.     She has had iron  deficiency in the past treated with IV iron  as well as requiring blood transfusion support during her hospitalization in August of this year.    Wt Readings from Last 3 Encounters:  03/26/24 225 lb (102.1 kg)  03/01/24 226 lb (102.5 kg)  01/30/24 226 lb 12.8 oz (102.9 kg)     Medications:   Current Outpatient Medications:    albuterol  (PROVENTIL ) (2.5 MG/3ML) 0.083%  nebulizer solution, Take 3 mLs (2.5 mg total) by nebulization every 4 (four) hours as needed for wheezing or shortness of breath (Dx: J45.50)., Disp: 75 mL, Rfl: 1   albuterol  (VENTOLIN  HFA) 108 (90 Base) MCG/ACT inhaler, INHALE 2 PUFFS INTO LUNGS EVERY 6 HOURS AS NEEDED FOR WHEEZING/SHORTNESS OF BREATH, Disp: 8 g, Rfl: 1   amitriptyline  (ELAVIL ) 25 MG tablet, TAKE 2 TABLETS BY MOUTH AT BEDTIME., Disp: 60 tablet, Rfl: 3   clobetasol  ointment (TEMOVATE ) 0.05 %, APPLY 1 APPLICATION TOPICALLY AS NEEDED. APPLY TO THE SKIN OF THE VULVA FOR ITCHING, Disp: 30 g, Rfl: 2   diclofenac  Sodium (VOLTAREN ) 1 % GEL, Apply 2 g topically 4 (four) times daily., Disp: 100 g, Rfl: 3   felodipine  (PLENDIL ) 10 MG 24 hr tablet, Take 1 tablet (10 mg total) by mouth daily., Disp: 90 tablet, Rfl: 3   ferrous sulfate 325 (65 FE) MG EC tablet, Take 325 mg by mouth at bedtime., Disp: , Rfl:    fluticasone  (CUTIVATE ) 0.05 % cream, APPLY TO AFFECTED AREA OF RIGHT LOWER LEG TWICE PER DAY., Disp: 60 g, Rfl: 1   furosemide  (LASIX ) 40 MG tablet, Take 1 tablet (40 mg total) by mouth daily., Disp: 30 tablet, Rfl: 1   HYDROcodone -acetaminophen  (NORCO) 10-325 MG tablet, 1/2 - 1 tab po bid prn pain, Disp: 45 tablet, Rfl: 0   Insulin  Aspart FlexPen (NOVOLOG ) 100 UNIT/ML, NJECT 10-25 UNITS INTO THE SKIN 3 (THREE) 15 MINUTES BEFORE MEALS, Disp: 60 mL, Rfl: 3   insulin  NPH Human (NOVOLIN N) 100 UNIT/ML injection, INJECT 60 UNITS UNDER SKIN DAILY AS ADVISED, Disp: 150 mL, Rfl: 3   Insulin  Pen Needle 32G X 4 MM MISC, Use 3x a day, Disp: 300 each, Rfl: 3   Insulin  Syringes, Disposable, U-100 0.3 ML MISC, Use to inject insulin --6 injections per day, Disp: 540 each, Rfl: 3   levocetirizine (XYZAL) 5 MG tablet, Take 5 mg by mouth at bedtime., Disp: , Rfl: 5   levothyroxine  (SYNTHROID ) 125 MCG tablet, Take 1 tablet (125 mcg total) by mouth daily before breakfast., Disp: 90 tablet, Rfl: 3   metFORMIN  (GLUCOPHAGE ) 500 MG tablet, Take 2 tablets (1,000  mg total) by mouth daily with supper., Disp: 180 tablet, Rfl: 3   miconazole (MICOTIN) 2 % powder, Apply 1 application topically 2 (two) times daily as needed for itching., Disp: , Rfl:    Phenazopyridine HCl (AZO URINARY PAIN PO), Take 2 tablets by mouth 3 (three) times daily as needed (urinary pain)., Disp: , Rfl:    pravastatin  (PRAVACHOL ) 40 MG tablet, Take 1 tablet (40 mg total) by mouth daily., Disp: 90 tablet, Rfl: 1   Probiotic Product (PROBIOTIC PO), Take 1 capsule by mouth daily., Disp: , Rfl:   Allergies:  Allergies  Allergen Reactions   Adhesive [Tape] Other (See  Comments)    Takes patient's skin off.   Banana Diarrhea and Nausea Only   Egg Protein-Containing Drug Products Diarrhea and Nausea Only   Latex Other (See Comments)    sensativity   Linzess [Linaclotide] Diarrhea   Ozempic  (0.25 Or 0.5 Mg-Dose) [Semaglutide (0.25 Or 0.5mg -Dos)] Diarrhea   Penicillins Rash and Other (See Comments)    Has had cephalosporins without incident   Pine Swelling and Rash   Rose Swelling and Rash    Past Medical History, Surgical history, Social history, and Family History were reviewed and updated.  Review of Systems: Review of Systems  Constitutional: Negative.   HENT:  Negative.    Eyes: Negative.   Respiratory: Negative.    Cardiovascular: Negative.   Gastrointestinal: Negative.   Endocrine: Negative.   Genitourinary: Negative.    Musculoskeletal:  Positive for gait problem (chronic) and myalgias (chronic).  Neurological:  Positive for gait problem (chronic).  Hematological: Negative.   Psychiatric/Behavioral: Negative.       Physical Exam:  height is 5' 1 (1.549 m) and weight is 225 lb (102.1 kg). Her oral temperature is 98.7 F (37.1 C). Her blood pressure is 134/59 (abnormal) and her pulse is 88. Her respiration is 18 and oxygen  saturation is 98%.   Physical Exam General: NAD. Using electric scooter  Cardiovascular: regular rate and rhythm Pulmonary: clear ant  fields Extremities: no edema, no joint deformities Skin: no rashes Neurological: Weakness but otherwise nonfocal   Lab Results  Component Value Date   WBC 8.2 03/26/2024   HGB 11.8 (L) 03/26/2024   HCT 36.1 03/26/2024   MCV 95.5 03/26/2024   PLT 244 03/26/2024     Chemistry      Component Value Date/Time   NA 144 03/26/2024 1140   K 4.2 03/26/2024 1140   CL 102 03/26/2024 1140   CO2 29 03/26/2024 1140   BUN 41 (H) 03/26/2024 1140   CREATININE 1.32 (H) 03/26/2024 1140   CREATININE 1.63 (H) 11/17/2023 1442      Component Value Date/Time   CALCIUM 9.5 03/26/2024 1140   ALKPHOS 82 03/26/2024 1140   AST 16 03/26/2024 1140   ALT 10 03/26/2024 1140   BILITOT 0.2 03/26/2024 1140     Encounter Diagnoses  Name Primary?   Erythropoietin  deficiency anemia Yes   Chronic kidney disease, stage 3a (HCC)    Symptomatic anemia     Assessment and Plan- [name] who was referred to us  for anemia. She has both iron  and erythropoietin  deficiency related anemia. She gets IV iron  PRN along with Aranesp  for Hgb < 11.   Today her Hgb is 11.8-No Arensp today  Iron  levels pending- will give IV iron  if indicated  Disposition: RTC 2 weeks labs, injection RTC 5 weeks labs, injection RTC 2 months labs, APP, injection    Lauraine Dais PA-C 12/16/20251:31 PM

## 2024-03-27 LAB — ERYTHROPOIETIN: Erythropoietin: 12.5 m[IU]/mL (ref 2.6–18.5)

## 2024-03-28 ENCOUNTER — Ambulatory Visit: Payer: Self-pay | Admitting: Medical Oncology

## 2024-04-09 ENCOUNTER — Inpatient Hospital Stay

## 2024-04-09 VITALS — BP 136/40 | HR 80 | Temp 99.0°F | Resp 16

## 2024-04-09 DIAGNOSIS — N1831 Chronic kidney disease, stage 3a: Secondary | ICD-10-CM

## 2024-04-09 DIAGNOSIS — D631 Anemia in chronic kidney disease: Secondary | ICD-10-CM

## 2024-04-09 DIAGNOSIS — D649 Anemia, unspecified: Secondary | ICD-10-CM

## 2024-04-09 LAB — CBC
HCT: 32.1 % — ABNORMAL LOW (ref 36.0–46.0)
Hemoglobin: 10.6 g/dL — ABNORMAL LOW (ref 12.0–15.0)
MCH: 31.2 pg (ref 26.0–34.0)
MCHC: 33 g/dL (ref 30.0–36.0)
MCV: 94.4 fL (ref 80.0–100.0)
Platelets: 250 K/uL (ref 150–400)
RBC: 3.4 MIL/uL — ABNORMAL LOW (ref 3.87–5.11)
RDW: 13.6 % (ref 11.5–15.5)
WBC: 9.5 K/uL (ref 4.0–10.5)
nRBC: 0 % (ref 0.0–0.2)

## 2024-04-09 MED ORDER — DARBEPOETIN ALFA 300 MCG/0.6ML IJ SOSY
300.0000 ug | PREFILLED_SYRINGE | Freq: Once | INTRAMUSCULAR | Status: AC
  Administered 2024-04-09: 300 ug via SUBCUTANEOUS
  Filled 2024-04-09: qty 0.6

## 2024-04-09 NOTE — Patient Instructions (Signed)

## 2024-04-18 ENCOUNTER — Ambulatory Visit: Admitting: Internal Medicine

## 2024-04-29 ENCOUNTER — Encounter: Payer: Self-pay | Admitting: Family Medicine

## 2024-04-29 ENCOUNTER — Ambulatory Visit: Admitting: Family Medicine

## 2024-04-29 ENCOUNTER — Encounter: Payer: Self-pay | Admitting: Family

## 2024-04-29 VITALS — BP 135/74 | HR 95 | Temp 98.2°F | Ht 61.0 in | Wt 231.2 lb

## 2024-04-29 DIAGNOSIS — I10 Essential (primary) hypertension: Secondary | ICD-10-CM

## 2024-04-29 DIAGNOSIS — M25532 Pain in left wrist: Secondary | ICD-10-CM

## 2024-04-29 DIAGNOSIS — N1832 Chronic kidney disease, stage 3b: Secondary | ICD-10-CM

## 2024-04-29 DIAGNOSIS — E7800 Pure hypercholesterolemia, unspecified: Secondary | ICD-10-CM

## 2024-04-29 DIAGNOSIS — M25531 Pain in right wrist: Secondary | ICD-10-CM

## 2024-04-29 DIAGNOSIS — N2889 Other specified disorders of kidney and ureter: Secondary | ICD-10-CM

## 2024-04-29 DIAGNOSIS — M19031 Primary osteoarthritis, right wrist: Secondary | ICD-10-CM

## 2024-04-29 DIAGNOSIS — S63512A Sprain of carpal joint of left wrist, initial encounter: Secondary | ICD-10-CM

## 2024-04-29 DIAGNOSIS — S63511A Sprain of carpal joint of right wrist, initial encounter: Secondary | ICD-10-CM

## 2024-04-29 DIAGNOSIS — M792 Neuralgia and neuritis, unspecified: Secondary | ICD-10-CM | POA: Diagnosis not present

## 2024-04-29 DIAGNOSIS — M19032 Primary osteoarthritis, left wrist: Secondary | ICD-10-CM

## 2024-04-29 DIAGNOSIS — G894 Chronic pain syndrome: Secondary | ICD-10-CM

## 2024-04-29 DIAGNOSIS — M150 Primary generalized (osteo)arthritis: Secondary | ICD-10-CM

## 2024-04-29 MED ORDER — HYDROCODONE-ACETAMINOPHEN 10-325 MG PO TABS: ORAL_TABLET | ORAL | 0 refills | Status: AC

## 2024-04-29 NOTE — Progress Notes (Addendum)
 OFFICE VISIT  04/29/2024  CC:  Chief Complaint  Patient presents with   Medical Management of Chronic Issues    Patient is a 74 y.o. female who presents for 32-month follow-up chronic pain syndrome, hypercholesterolemia, chronic renal insufficiency, and hypertension. A/P as of last visit: #1 chronic pain syndrome. Osteoarthritis multiple sites plus chemo-induced neuropathy. Stable. Continue Vicodin 10/325, 1-2 every twice daily as needed, #45 today.   #2 recurrent UTI. Asymptomatic currently. 1 complicating factor is the fact that she had bilateral hydronephrosis and hydroureter with urothelial enhancement on a CT of the abdomen pelvis with contrast that was done 01/10/2024 to follow-up a history of cervical cancer. No obstructing lesion was identified.  She did have some upper abdominal retroperitoneal adenopathy favored related to chronic renal findings. Nephrology culture and told her they would refer her to a urologist.  She will follow-up with the nephrologist office because she has not heard anything back about this referral yet.   #3 chronic renal insufficiency stage III. This has been stable on labs back in August and again on 01/01/2024. She has plans for follow-up with the hematologist/oncologist regarding labs and potential ongoing need for EPO injections.   #4 anemia of chronic renal insufficiency. EPO injection x 1 so far and it has helped some. Follow-up is set as noted in #3 above. No labs today.  INTERIM HX: Overall doing fine except worsened left wrist pain x 3 wks due to acute injury in which she slipped and fell forward and put her hand out to break her fall against a wall.  Since then the pain has been 8 out of 10 severity.  She wears a wrist splint.  She had epo infusion 04/09/24.  No iron  infusion was needed.  Her breathing feels fine.  CHRONIC PAIN SYNDROME: Osteoarthritis in her wrists and hands and her neuropathic pain in her feet.  She also describes  some osteoarthritic pain in the left foot. Indication for chronic opioid: osteoarthritis in hands, low back, feet, L hip, knees, wrists+chemotherapy -induced neuropathy (burning and numbness in feet). Takes 1-2 hydrocodone  tabs a day typically.  Not NSAID candidate due to renal insufficiency. PMP AWARE reviewed today: most recent rx for Vicodin 10/325 was filled 02/29/2024, # 45, rx by me. No red flags.  Past Medical History:  Diagnosis Date   Allergic rhinitis    Allergy testing: results pending as of 05/28/16 (Dr. Fleeta Smock)   Allergy 1970   Anemia 10/2020   anemia of chronic dz (? + mild IDA?--iron  started   Chemotherapy-induced neuropathy 2019   CHF (congestive heart failure) (HCC)    Chronic renal insufficiency, stage 3 (moderate)    Cough variant asthma    DDD (degenerative disc disease), lumbar    Diabetes mellitus with complication (HCC)    Mild nonprolif DR R eye 12/2017-->referred to retinal specialist DM MANAGED BY DR. GHERGHE AS OF 2021   Endometrial cancer (HCC) 02/2018   REMISSION AS OF 08/2018.  Stage III (T3, Nx, Mx)  Path: endometroid adenocarcinoma involving cervix, uterus, and fallopian tubes (Pt got TAH & BSO 02/2018). No sign of recurrence as of onc f/u 05/2019.   Endometrial hyperplasia 07/02/2015   Treated with D&C and then Mirena IUD x 5 years.  IUD removed 3/17.     GERD (gastroesophageal reflux disease)    Hemorrhoids    Herpes zoster 10/2015   L side belt-line   History of adenomatous polyp of colon 02/25/2013   5 mm cecal polyp removed by  Dr. Alfrieda 5 yrs   History of blood transfusion    History of cellulitis 08/2010   Left (Since replacemnt of Left knee   History of radiation therapy 05/21/2018   [facility] brachytherapy 04/24/2018-05/21/2018  Dr Lynwood Nasuti   Hyperkalemia 03/2018   started after pt started getting chemo-->had to stop enalapril .   Hyperlipidemia    Not on statin b/c lipids stayed down when sugars came down per pt.  She says Dr.  Tommas knows she is not on statin anymore.   Hypertension    Hypothyroidism    IDA (iron  deficiency anemia)    08/2023 EGD and colon nl other than mild chronic inactive gastritis (h pylori NEG) and one small adenoma and   Moderate persistent asthma    Nephrolithiasis 07/2005   Obesity    OSA on CPAP    Osteoarthritis    knees, ankle, + ? scapholunate ligament disruption (x-ray 06/2017).  Progressive arthritis L wrist and hand 09/2022 radiographs   Pancytopenia due to antineoplastic chemotherapy    progressive as of 06/2018. Stable 08/2018.   Recurrent UTI    started cipro  250 qd 09/2018   Sciatica of left side    Severe persistent asthma (HCC)    cough-variant--saw Allergist 05/25/16 and was switched from max dose advair to symbicort .   Sleep apnea 2007   Systolic murmur    ECHO fine 10/2016-->murmur likely flow murmur assoc with HTN.   Zoon's vulvitis    Bx-proven (GYN) -lichen sclerosis.  Clobetasol  0.05% ointment per GYN    Past Surgical History:  Procedure Laterality Date   ABDOMINAL HYSTERECTOMY  2019   ANKLE FRACTURE SURGERY  1984   Pin & repair   ARTHROSCOPIC REPAIR ACL  2000   Due to ACL tear   ARTHROSCOPIC REPAIR ACL  08/2006   Blateral knee rerlacements x4 2 times each knee     BREAST BIOPSY Right 2014   Benign   CARDIAC CATHETERIZATION  08/2005   clear vessel mild mitral    CARPAL TUNNEL RELEASE Right [id number]   1989 Left   CESAREAN SECTION  1985   CHOLECYSTECTOMY OPEN  1988   COLONOSCOPY N/A 02/25/2013   Tubular adenoma x 1: Recall 5 yrs. Procedure: COLONOSCOPY;  Surgeon: Renaye Sous, MD;  Location: [facility];  Service: Endoscopy;  Laterality: N/A;   COLONOSCOPY     08/22/2023 (IDA) [facility]--> 1 polyp, +diverticulosis.   COMBINED HYSTEROSCOPY DIAGNOSTIC / D&C  05/2010   Bx neg   DEXA  08/2007; 05/12/20   Bone density normal 2009 and 2022.  Plan rpt 2024.   DILATION AND CURETTAGE OF UTERUS  03/2010   ESOPHAGOGASTRODUODENOSCOPY     08/22/2023 [facility]  (IDA): antral erythema, nothing to explain bleeding   EYE SURGERY     FRACTURE SURGERY  1984   IR IMAGING GUIDED PORT INSERTION  03/16/2018   IR REMOVAL TUN ACCESS W/ PORT W/O FL MOD SED  09/20/2018   JOINT REPLACEMENT     Four knee replacements   LYMPH NODE BIOPSY N/A 02/19/2018   Procedure: Sentinel LYMPH NODE BIOPSY;  Surgeon: Eloy Herring, MD;  [facility];  Service: Gynecology;  Laterality: N/A;   REPLACEMENT TOTAL KNEE Left 08/2010   X 2 on each   ROBOTIC ASSISTED TOTAL HYSTERECTOMY WITH BILATERAL SALPINGO OOPHERECTOMY N/A 02/19/2018   For endometrial cancer.  Procedure: XI ROBOTIC ASSISTED TOTAL HYSTERECTOMY WITH BILATERAL SALPINGO OOPHORECTOMY;  Surgeon: Eloy Herring, MD;  Location: [facility];  Service: Gynecology;  Laterality: N/A;   TRANSTHORACIC ECHOCARDIOGRAM  10/11/2016   2018 EF 55-60%, grd I DD.  2025 normal   TUBAL LIGATION  1985   C-Section    Outpatient Medications Prior to Visit  Medication Sig Dispense Refill   albuterol  (PROVENTIL ) (2.5 MG/3ML) 0.083% nebulizer solution Take 3 mLs (2.5 mg total) by nebulization every 4 (four) hours as needed for wheezing or shortness of breath (Dx: J45.50). 75 mL 1   albuterol  (VENTOLIN  HFA) 108 (90 Base) MCG/ACT inhaler INHALE 2 PUFFS INTO LUNGS EVERY 6 HOURS AS NEEDED FOR WHEEZING/SHORTNESS OF BREATH 8 g 1   amitriptyline  (ELAVIL ) 25 MG tablet TAKE 2 TABLETS BY MOUTH AT BEDTIME. 60 tablet 3   clobetasol  ointment (TEMOVATE ) 0.05 % APPLY 1 APPLICATION TOPICALLY AS NEEDED. APPLY TO THE SKIN OF THE VULVA FOR ITCHING 30 g 2   diclofenac  Sodium (VOLTAREN ) 1 % GEL Apply 2 g topically 4 (four) times daily. 100 g 3   felodipine  (PLENDIL ) 10 MG 24 hr tablet Take 1 tablet (10 mg total) by mouth daily. 90 tablet 3   ferrous sulfate 325 (65 FE) MG EC tablet Take 325 mg by mouth at bedtime.     fluticasone  (CUTIVATE ) 0.05 % cream APPLY TO AFFECTED AREA OF RIGHT LOWER LEG TWICE PER DAY. 60 g 1   furosemide   (LASIX ) 40 MG tablet Take 1 tablet (40 mg total) by mouth daily. 30 tablet 1   Insulin  Aspart FlexPen (NOVOLOG ) 100 UNIT/ML NJECT 10-25 UNITS INTO THE SKIN 3 (THREE) 15 MINUTES BEFORE MEALS 60 mL 3   insulin  NPH Human (NOVOLIN N) 100 UNIT/ML injection INJECT 60 UNITS UNDER SKIN DAILY AS ADVISED 150 mL 3   Insulin  Pen Needle 32G X 4 MM MISC Use 3x a day 300 each 3   Insulin  Syringes, Disposable, U-100 0.3 ML MISC Use to inject insulin --6 injections per day 540 each 3   levocetirizine (XYZAL) 5 MG tablet Take 5 mg by mouth at bedtime.  5   levothyroxine  (SYNTHROID ) 125 MCG tablet Take 1 tablet (125 mcg total) by mouth daily before breakfast. 90 tablet 3   metFORMIN  (GLUCOPHAGE ) 500 MG tablet Take 2 tablets (1,000 mg total) by mouth daily with supper. 180 tablet 3   miconazole (MICOTIN) 2 % powder Apply 1 application topically 2 (two) times daily as needed for itching.     Phenazopyridine HCl (AZO URINARY PAIN PO) Take 2 tablets by mouth 3 (three) times daily as needed (urinary pain).     pravastatin  (PRAVACHOL ) 40 MG tablet Take 1 tablet (40 mg total) by mouth daily. 90 tablet 1   Probiotic Product (PROBIOTIC PO) Take 1 capsule by mouth daily.     HYDROcodone -acetaminophen  (NORCO) 10-325 MG tablet 1/2 - 1 tab po bid prn pain 45 tablet 0   No facility-administered medications prior to visit.    Allergies[1]  Review of Systems As per HPI  PE:    04/29/2024    1:08 PM 04/29/2024    1:05 PM 04/09/2024    1:37 PM  Vitals with BMI  Height  5' 1   Weight  231 lbs 3 oz   BMI  43.71   Systolic 135 143 863  Diastolic 74 78 40  Pulse  95 80     Physical Exam  Gen: Alert, well appearing.  Patient is oriented to person, place, time, and situation. AFFECT: pleasant, lucid thought and speech. Right wrist without swelling.  Range of motion significantly limited.  No erythema.  She has tenderness over the wrist in general, particularly scapholunate region volar aspect. No tenderness at the  first [facility] joint.  LABS:  Last CBC Lab Results  Component Value Date   WBC 9.5 04/09/2024   HGB 10.6 (L) 04/09/2024   HCT 32.1 (L) 04/09/2024   MCV 94.4 04/09/2024   MCH 31.2 04/09/2024   RDW 13.6 04/09/2024   PLT 250 04/09/2024   Lab Results  Component Value Date   IRON  72 03/26/2024   TIBC 314 03/26/2024   FERRITIN 478 (H) 03/26/2024   Lab Results  Component Value Date   IRON  72 03/26/2024   TIBC 314 03/26/2024   FERRITIN 478 (H) 03/26/2024   Last metabolic panel Lab Results  Component Value Date   GLUCOSE 92 03/26/2024   NA 144 03/26/2024   K 4.2 03/26/2024   CL 102 03/26/2024   CO2 29 03/26/2024   BUN 41 (H) 03/26/2024   CREATININE 1.32 (H) 03/26/2024   GFRNONAA 42 (L) 03/26/2024   CALCIUM 9.5 03/26/2024   PROT 7.9 03/26/2024   ALBUMIN 4.1 03/26/2024   BILITOT 0.2 03/26/2024   ALKPHOS 82 03/26/2024   AST 16 03/26/2024   ALT 10 03/26/2024   ANIONGAP 12 03/26/2024   Last lipids Lab Results  Component Value Date   CHOL 110 04/28/2023   HDL 28.70 (L) 04/28/2023   LDLCALC 45 04/28/2023   LDLDIRECT 78.0 10/05/2018   TRIG 185.0 (H) 04/28/2023   CHOLHDL 4 04/28/2023   Last hemoglobin A1c Lab Results  Component Value Date   HGBA1C 7.4 (A) 03/01/2024   Last thyroid  functions Lab Results  Component Value Date   TSH 2.773 11/21/2023   IMPRESSION AND PLAN:  #1 chronic pain syndrome. Osteoarthritis multiple sites plus chemo-induced neuropathy. Her chronic pain is stable. Continue Vicodin 10/325, 1-2 every twice daily as needed, #45 today.  #2 acute left wrist injury. Check left wrist radiographs. Will refer her back to orthopedics.  #3 chronic renal insufficiency stage IIIb. Stable as of 03/26/2024 lab results. Continue avoidance of NSAIDs and continue good hydration.  4.  Hypertension, well-controlled on felodipine  10 mg daily.  5.  Hypercholesterolemia, has done well long-term on pravastatin  40 mg a day. Her last LDL was 45 approximately 1  year ago.  She is not fasting today.  An After Visit Summary was printed and given to the patient.  FOLLOW UP: Return in about 3 months (around 07/28/2024) for routine chronic illness f/u.  Signed:  Gerlene Hockey, MD           04/29/2024     [1]  Allergies Allergen Reactions   Adhesive [Tape] Other (See Comments)    Takes patient's skin off.   Banana Diarrhea and Nausea Only   Egg Protein-Containing Drug Products Diarrhea and Nausea Only   Latex Other (See Comments)    sensativity   Linzess [Linaclotide] Diarrhea   Ozempic  (0.25 Or 0.5 Mg-Dose) [Semaglutide (0.25 Or 0.5mg -Dos)] Diarrhea   Penicillins Rash and Other (See Comments)    Has had cephalosporins without incident   Pine Swelling and Rash   Rose Swelling and Rash

## 2024-04-30 ENCOUNTER — Ambulatory Visit (HOSPITAL_BASED_OUTPATIENT_CLINIC_OR_DEPARTMENT_OTHER)
Admission: RE | Admit: 2024-04-30 | Discharge: 2024-04-30 | Disposition: A | Discharge status: Home / Self Care | Source: Ambulatory Visit | Attending: Family Medicine | Admitting: Family Medicine

## 2024-04-30 ENCOUNTER — Inpatient Hospital Stay

## 2024-04-30 ENCOUNTER — Inpatient Hospital Stay: Attending: Obstetrics & Gynecology

## 2024-04-30 DIAGNOSIS — M25531 Pain in right wrist: Secondary | ICD-10-CM | POA: Insufficient documentation

## 2024-04-30 DIAGNOSIS — D631 Anemia in chronic kidney disease: Secondary | ICD-10-CM

## 2024-04-30 DIAGNOSIS — M19031 Primary osteoarthritis, right wrist: Secondary | ICD-10-CM | POA: Insufficient documentation

## 2024-04-30 DIAGNOSIS — S63511A Sprain of carpal joint of right wrist, initial encounter: Secondary | ICD-10-CM | POA: Insufficient documentation

## 2024-04-30 DIAGNOSIS — N1831 Chronic kidney disease, stage 3a: Secondary | ICD-10-CM

## 2024-04-30 DIAGNOSIS — D649 Anemia, unspecified: Secondary | ICD-10-CM

## 2024-04-30 LAB — CBC
HCT: 35.1 % — ABNORMAL LOW (ref 36.0–46.0)
Hemoglobin: 11.2 g/dL — ABNORMAL LOW (ref 12.0–15.0)
MCH: 30.7 pg (ref 26.0–34.0)
MCHC: 31.9 g/dL (ref 30.0–36.0)
MCV: 96.2 fL (ref 80.0–100.0)
Platelets: 267 K/uL (ref 150–400)
RBC: 3.65 MIL/uL — ABNORMAL LOW (ref 3.87–5.11)
RDW: 13.7 % (ref 11.5–15.5)
WBC: 7.8 K/uL (ref 4.0–10.5)
nRBC: 0 % (ref 0.0–0.2)

## 2024-04-30 NOTE — Progress Notes (Unsigned)
 No aranesp  today. Hgb is 11.2. Pt given copy of labwork.

## 2024-05-05 ENCOUNTER — Other Ambulatory Visit: Payer: Self-pay | Admitting: Family Medicine

## 2024-05-05 ENCOUNTER — Ambulatory Visit: Payer: Self-pay | Admitting: Family Medicine

## 2024-05-09 ENCOUNTER — Other Ambulatory Visit: Payer: Self-pay

## 2024-05-09 DIAGNOSIS — E1142 Type 2 diabetes mellitus with diabetic polyneuropathy: Secondary | ICD-10-CM

## 2024-05-09 MED ORDER — FREESTYLE LIBRE 2 PLUS SENSOR MISC: 1.0000 | 3 refills | Status: AC

## 2024-05-09 NOTE — Telephone Encounter (Signed)
 Patient states she needs RX for Freestyle 2 plus sent to [facility] [employer] in [place] due to insurance coverage change. Refill request complete.

## 2024-05-28 ENCOUNTER — Inpatient Hospital Stay

## 2024-05-28 ENCOUNTER — Inpatient Hospital Stay: Attending: Obstetrics & Gynecology | Admitting: Medical Oncology

## 2024-07-09 ENCOUNTER — Ambulatory Visit: Admitting: Internal Medicine

## 2024-07-25 ENCOUNTER — Ambulatory Visit: Admitting: Dermatology

## 2024-07-29 ENCOUNTER — Ambulatory Visit: Admitting: Family Medicine
